# Patient Record
Sex: Male | Born: 1935 | Race: White | Hispanic: No | Marital: Married | Smoking: Former smoker
Health system: Southern US, Community
[De-identification: ages and names within clinical notes are randomized; demographics above are authoritative.]

## PROBLEM LIST (undated history)

## (undated) DIAGNOSIS — Z9861 Coronary angioplasty status: Secondary | ICD-10-CM

## (undated) DIAGNOSIS — J449 Chronic obstructive pulmonary disease, unspecified: Secondary | ICD-10-CM

## (undated) DIAGNOSIS — I739 Peripheral vascular disease, unspecified: Secondary | ICD-10-CM

## (undated) DIAGNOSIS — F329 Major depressive disorder, single episode, unspecified: Secondary | ICD-10-CM

## (undated) DIAGNOSIS — I2510 Atherosclerotic heart disease of native coronary artery without angina pectoris: Secondary | ICD-10-CM

## (undated) DIAGNOSIS — F32A Depression, unspecified: Secondary | ICD-10-CM

## (undated) DIAGNOSIS — R0600 Dyspnea, unspecified: Secondary | ICD-10-CM

## (undated) DIAGNOSIS — F0390 Unspecified dementia without behavioral disturbance: Secondary | ICD-10-CM

## (undated) DIAGNOSIS — I2119 ST elevation (STEMI) myocardial infarction involving other coronary artery of inferior wall: Secondary | ICD-10-CM

## (undated) DIAGNOSIS — Q159 Congenital malformation of eye, unspecified: Secondary | ICD-10-CM

## (undated) DIAGNOSIS — G9341 Metabolic encephalopathy: Secondary | ICD-10-CM

## (undated) DIAGNOSIS — I10 Essential (primary) hypertension: Secondary | ICD-10-CM

## (undated) DIAGNOSIS — N183 Chronic kidney disease, stage 3 unspecified: Secondary | ICD-10-CM

## (undated) DIAGNOSIS — M1990 Unspecified osteoarthritis, unspecified site: Secondary | ICD-10-CM

## (undated) DIAGNOSIS — D631 Anemia in chronic kidney disease: Secondary | ICD-10-CM

## (undated) DIAGNOSIS — N184 Chronic kidney disease, stage 4 (severe): Secondary | ICD-10-CM

## (undated) DIAGNOSIS — A414 Sepsis due to anaerobes: Secondary | ICD-10-CM

## (undated) DIAGNOSIS — E039 Hypothyroidism, unspecified: Secondary | ICD-10-CM

## (undated) DIAGNOSIS — N179 Acute kidney failure, unspecified: Secondary | ICD-10-CM

## (undated) DIAGNOSIS — I340 Nonrheumatic mitral (valve) insufficiency: Secondary | ICD-10-CM

## (undated) DIAGNOSIS — F419 Anxiety disorder, unspecified: Secondary | ICD-10-CM

## (undated) DIAGNOSIS — K902 Blind loop syndrome, not elsewhere classified: Secondary | ICD-10-CM

## (undated) HISTORY — PX: AMPUTATION: SHX166

## (undated) HISTORY — DX: Chronic kidney disease, stage 3 (moderate): N18.3

## (undated) HISTORY — PX: CORNEAL TRANSPLANT: SHX108

## (undated) HISTORY — DX: Coronary angioplasty status: Z98.61

## (undated) HISTORY — DX: Chronic kidney disease, stage 4 (severe): N18.4

## (undated) HISTORY — PX: CARPAL TUNNEL RELEASE: SHX101

## (undated) HISTORY — PX: ROTATOR CUFF REPAIR: SHX139

## (undated) HISTORY — DX: Chronic kidney disease, stage 3 unspecified: N18.30

## (undated) HISTORY — DX: Chronic obstructive pulmonary disease, unspecified: J44.9

## (undated) HISTORY — PX: TOTAL KNEE ARTHROPLASTY: SHX125

## (undated) HISTORY — DX: Atherosclerotic heart disease of native coronary artery without angina pectoris: I25.10

## (undated) HISTORY — DX: ST elevation (STEMI) myocardial infarction involving other coronary artery of inferior wall: I21.19

## (undated) HISTORY — PX: HERNIA REPAIR: SHX51

## (undated) HISTORY — PX: TRANSTHORACIC ECHOCARDIOGRAM: SHX275

## (undated) HISTORY — DX: Peripheral vascular disease, unspecified: I73.9

## (undated) HISTORY — DX: Blind loop syndrome, not elsewhere classified: K90.2

## (undated) HISTORY — PX: CORONARY ANGIOPLASTY WITH STENT PLACEMENT: SHX49

## (undated) HISTORY — DX: Anemia in chronic kidney disease: D63.1

## (undated) HISTORY — DX: Nonrheumatic mitral (valve) insufficiency: I34.0

## (undated) HISTORY — DX: Essential (primary) hypertension: I10

## (undated) HISTORY — PX: VAGOTOMY: SUR1431

## (undated) HISTORY — PX: PARTIAL GASTRECTOMY: SHX2172

## (undated) HISTORY — PX: FOOT SURGERY: SHX648

## (undated) HISTORY — DX: Hypothyroidism, unspecified: E03.9

---
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
## 1983-12-24 HISTORY — PX: SPINE SURGERY: SHX786

## 1998-07-17 ENCOUNTER — Inpatient Hospital Stay (HOSPITAL_COMMUNITY): Admission: RE | Admit: 1998-07-17 | Discharge: 1998-07-21 | Payer: Self-pay | Admitting: Specialist

## 1998-07-24 ENCOUNTER — Encounter: Admission: RE | Admit: 1998-07-24 | Discharge: 1998-10-22 | Payer: Self-pay | Admitting: Specialist

## 1998-07-24 ENCOUNTER — Encounter (HOSPITAL_COMMUNITY): Admission: RE | Admit: 1998-07-24 | Discharge: 1998-10-22 | Payer: Self-pay | Admitting: Specialist

## 1998-12-25 ENCOUNTER — Encounter: Payer: Self-pay | Admitting: Internal Medicine

## 1998-12-25 ENCOUNTER — Inpatient Hospital Stay (HOSPITAL_COMMUNITY): Admission: EM | Admit: 1998-12-25 | Discharge: 1998-12-26 | Payer: Self-pay | Admitting: Emergency Medicine

## 1998-12-25 ENCOUNTER — Encounter: Payer: Self-pay | Admitting: Emergency Medicine

## 1999-01-10 ENCOUNTER — Encounter: Payer: Self-pay | Admitting: *Deleted

## 1999-01-10 ENCOUNTER — Ambulatory Visit (HOSPITAL_COMMUNITY): Admission: RE | Admit: 1999-01-10 | Discharge: 1999-01-10 | Payer: Self-pay | Admitting: *Deleted

## 1999-01-30 ENCOUNTER — Ambulatory Visit (HOSPITAL_COMMUNITY): Admission: RE | Admit: 1999-01-30 | Discharge: 1999-01-30 | Payer: Self-pay | Admitting: *Deleted

## 1999-02-21 ENCOUNTER — Other Ambulatory Visit: Admission: RE | Admit: 1999-02-21 | Discharge: 1999-02-21 | Payer: Self-pay | Admitting: Gastroenterology

## 1999-03-02 ENCOUNTER — Ambulatory Visit (HOSPITAL_COMMUNITY): Admission: RE | Admit: 1999-03-02 | Discharge: 1999-03-02 | Payer: Self-pay | Admitting: Pulmonary Disease

## 1999-03-02 ENCOUNTER — Encounter: Payer: Self-pay | Admitting: Pulmonary Disease

## 1999-03-20 ENCOUNTER — Ambulatory Visit: Admission: RE | Admit: 1999-03-20 | Discharge: 1999-03-20 | Payer: Self-pay | Admitting: Pulmonary Disease

## 2000-02-05 ENCOUNTER — Other Ambulatory Visit: Admission: RE | Admit: 2000-02-05 | Discharge: 2000-02-05 | Payer: Self-pay | Admitting: Internal Medicine

## 2000-02-05 ENCOUNTER — Encounter (INDEPENDENT_AMBULATORY_CARE_PROVIDER_SITE_OTHER): Payer: Self-pay | Admitting: Specialist

## 2000-04-27 ENCOUNTER — Emergency Department (HOSPITAL_COMMUNITY): Admission: EM | Admit: 2000-04-27 | Discharge: 2000-04-27 | Payer: Self-pay

## 2000-05-23 ENCOUNTER — Encounter: Payer: Self-pay | Admitting: Specialist

## 2000-05-28 ENCOUNTER — Observation Stay (HOSPITAL_COMMUNITY): Admission: RE | Admit: 2000-05-28 | Discharge: 2000-05-29 | Payer: Self-pay | Admitting: Specialist

## 2001-06-07 ENCOUNTER — Emergency Department (HOSPITAL_COMMUNITY): Admission: EM | Admit: 2001-06-07 | Discharge: 2001-06-07 | Payer: Self-pay | Admitting: Emergency Medicine

## 2001-06-23 ENCOUNTER — Encounter: Payer: Self-pay | Admitting: Cardiology

## 2001-06-23 ENCOUNTER — Encounter: Admission: RE | Admit: 2001-06-23 | Discharge: 2001-06-23 | Payer: Self-pay | Admitting: Cardiology

## 2001-06-30 ENCOUNTER — Ambulatory Visit (HOSPITAL_COMMUNITY): Admission: AD | Admit: 2001-06-30 | Discharge: 2001-06-30 | Payer: Self-pay | Admitting: Cardiology

## 2001-12-27 ENCOUNTER — Inpatient Hospital Stay (HOSPITAL_COMMUNITY): Admission: EM | Admit: 2001-12-27 | Discharge: 2001-12-31 | Payer: Self-pay | Admitting: Emergency Medicine

## 2001-12-27 ENCOUNTER — Encounter: Payer: Self-pay | Admitting: Emergency Medicine

## 2002-01-22 ENCOUNTER — Encounter (INDEPENDENT_AMBULATORY_CARE_PROVIDER_SITE_OTHER): Payer: Self-pay | Admitting: Specialist

## 2002-01-22 ENCOUNTER — Encounter: Payer: Self-pay | Admitting: Internal Medicine

## 2002-01-22 ENCOUNTER — Inpatient Hospital Stay (HOSPITAL_COMMUNITY): Admission: EM | Admit: 2002-01-22 | Discharge: 2002-01-28 | Payer: Self-pay | Admitting: Gastroenterology

## 2002-01-25 ENCOUNTER — Encounter: Payer: Self-pay | Admitting: Gastroenterology

## 2002-05-23 ENCOUNTER — Emergency Department (HOSPITAL_COMMUNITY): Admission: EM | Admit: 2002-05-23 | Discharge: 2002-05-23 | Payer: Self-pay | Admitting: Emergency Medicine

## 2002-05-23 ENCOUNTER — Encounter: Payer: Self-pay | Admitting: Emergency Medicine

## 2002-07-06 ENCOUNTER — Encounter: Payer: Self-pay | Admitting: Specialist

## 2002-07-06 ENCOUNTER — Ambulatory Visit (HOSPITAL_COMMUNITY): Admission: RE | Admit: 2002-07-06 | Discharge: 2002-07-06 | Payer: Self-pay | Admitting: Specialist

## 2002-11-23 ENCOUNTER — Inpatient Hospital Stay (HOSPITAL_COMMUNITY): Admission: RE | Admit: 2002-11-23 | Discharge: 2002-11-24 | Payer: Self-pay | Admitting: Orthopedic Surgery

## 2002-11-23 ENCOUNTER — Encounter: Payer: Self-pay | Admitting: Orthopedic Surgery

## 2005-01-17 ENCOUNTER — Ambulatory Visit: Payer: Self-pay | Admitting: Family Medicine

## 2005-04-24 ENCOUNTER — Ambulatory Visit: Payer: Self-pay | Admitting: Family Medicine

## 2005-05-09 ENCOUNTER — Ambulatory Visit: Payer: Self-pay | Admitting: Family Medicine

## 2005-05-14 ENCOUNTER — Emergency Department (HOSPITAL_COMMUNITY): Admission: EM | Admit: 2005-05-14 | Discharge: 2005-05-14 | Payer: Self-pay | Admitting: Emergency Medicine

## 2005-05-22 ENCOUNTER — Ambulatory Visit: Payer: Self-pay | Admitting: Family Medicine

## 2005-10-03 ENCOUNTER — Ambulatory Visit: Payer: Self-pay | Admitting: Family Medicine

## 2005-12-23 HISTORY — PX: CAROTID ENDARTERECTOMY: SUR193

## 2006-01-28 ENCOUNTER — Ambulatory Visit: Payer: Self-pay | Admitting: Family Medicine

## 2006-02-13 ENCOUNTER — Ambulatory Visit: Payer: Self-pay

## 2006-02-28 ENCOUNTER — Encounter: Admission: RE | Admit: 2006-02-28 | Discharge: 2006-02-28 | Payer: Self-pay | Admitting: *Deleted

## 2006-03-05 ENCOUNTER — Inpatient Hospital Stay (HOSPITAL_COMMUNITY): Admission: RE | Admit: 2006-03-05 | Discharge: 2006-03-06 | Payer: Self-pay | Admitting: *Deleted

## 2006-03-05 ENCOUNTER — Encounter (INDEPENDENT_AMBULATORY_CARE_PROVIDER_SITE_OTHER): Payer: Self-pay | Admitting: Specialist

## 2006-04-06 DIAGNOSIS — I779 Disorder of arteries and arterioles, unspecified: Secondary | ICD-10-CM

## 2006-04-06 HISTORY — DX: Disorder of arteries and arterioles, unspecified: I77.9

## 2006-04-17 ENCOUNTER — Ambulatory Visit: Payer: Self-pay | Admitting: Family Medicine

## 2006-09-04 ENCOUNTER — Ambulatory Visit: Payer: Self-pay | Admitting: Family Medicine

## 2006-09-17 ENCOUNTER — Ambulatory Visit: Payer: Self-pay

## 2006-10-07 ENCOUNTER — Ambulatory Visit: Payer: Self-pay | Admitting: Family Medicine

## 2006-10-07 LAB — CONVERTED CEMR LAB
AST: 23 units/L (ref 0–37)
BUN: 32 mg/dL — ABNORMAL HIGH (ref 6–23)
CO2: 27 meq/L (ref 19–32)
Calcium: 9.8 mg/dL (ref 8.4–10.5)
Chloride: 106 meq/L (ref 96–112)
Chol/HDL Ratio, serum: 4
Cholesterol: 129 mg/dL (ref 0–200)
Creatinine, Ser: 1.5 mg/dL (ref 0.4–1.5)
Glomerular Filtration Rate, Af Am: 59 mL/min/{1.73_m2}
HDL: 32.5 mg/dL — ABNORMAL LOW (ref >39.0)
LDL Cholesterol: 78 mg/dL (ref 0–99)
MCHC: 34.5 g/dL (ref 30.0–36.0)
Monocytes Relative: 11.7 % — ABNORMAL HIGH (ref 3.0–11.0)
PSA: 2.97 ng/mL (ref 0.10–4.00)
Platelets: 146 10*3/uL — ABNORMAL LOW (ref 150–400)
RBC: 4.35 M/uL (ref 4.22–5.81)
RDW: 14.1 % (ref 11.5–14.6)
TSH: 2.19 microintl units/mL (ref 0.35–5.50)
VLDL: 18 mg/dL (ref 0–40)

## 2007-03-03 ENCOUNTER — Ambulatory Visit: Payer: Self-pay

## 2007-03-11 ENCOUNTER — Emergency Department (HOSPITAL_COMMUNITY): Admission: EM | Admit: 2007-03-11 | Discharge: 2007-03-11 | Payer: Self-pay | Admitting: *Deleted

## 2007-06-14 ENCOUNTER — Emergency Department (HOSPITAL_COMMUNITY): Admission: EM | Admit: 2007-06-14 | Discharge: 2007-06-14 | Payer: Self-pay | Admitting: Emergency Medicine

## 2007-06-15 ENCOUNTER — Emergency Department (HOSPITAL_COMMUNITY): Admission: EM | Admit: 2007-06-15 | Discharge: 2007-06-15 | Payer: Self-pay | Admitting: Emergency Medicine

## 2007-06-16 ENCOUNTER — Ambulatory Visit: Payer: Self-pay | Admitting: Family Medicine

## 2007-06-30 ENCOUNTER — Encounter: Payer: Self-pay | Admitting: Family Medicine

## 2007-06-30 DIAGNOSIS — I10 Essential (primary) hypertension: Secondary | ICD-10-CM

## 2007-06-30 HISTORY — DX: Essential (primary) hypertension: I10

## 2007-07-01 ENCOUNTER — Ambulatory Visit: Payer: Self-pay | Admitting: Family Medicine

## 2007-07-01 LAB — CONVERTED CEMR LAB
Eosinophils Absolute: 0.1 10*3/uL (ref 0.0–0.6)
HCT: 40 % (ref 39.0–52.0)
MCHC: 34.8 g/dL (ref 30.0–36.0)
Neutro Abs: 4.9 10*3/uL (ref 1.4–7.7)
Neutrophils Relative %: 58.6 % (ref 43.0–77.0)
RBC: 3.93 M/uL — ABNORMAL LOW (ref 4.22–5.81)
RDW: 14 % (ref 11.5–14.6)
WBC: 8.1 10*3/uL (ref 4.5–10.5)

## 2007-07-29 ENCOUNTER — Ambulatory Visit: Payer: Self-pay | Admitting: Family Medicine

## 2007-07-30 LAB — CONVERTED CEMR LAB
Basophils Absolute: 0 10*3/uL (ref 0.0–0.1)
Eosinophils Absolute: 0.2 10*3/uL (ref 0.0–0.6)
HCT: 40.8 % (ref 39.0–52.0)
MCHC: 35.2 g/dL (ref 30.0–36.0)
MCV: 98.7 fL (ref 78.0–100.0)
Neutrophils Relative %: 61.5 % (ref 43.0–77.0)
Platelets: 115 10*3/uL — ABNORMAL LOW (ref 150–400)
RBC: 4.14 M/uL — ABNORMAL LOW (ref 4.22–5.81)
RDW: 12.6 % (ref 11.5–14.6)
Vitamin B-12: 247 pg/mL (ref 211–911)
WBC: 8.1 10*3/uL (ref 4.5–10.5)

## 2007-07-31 ENCOUNTER — Ambulatory Visit: Payer: Self-pay | Admitting: Internal Medicine

## 2007-08-06 ENCOUNTER — Ambulatory Visit: Payer: Self-pay | Admitting: Family Medicine

## 2007-08-14 LAB — CONVERTED CEMR LAB
Basophils Absolute: 0 10*3/uL (ref 0.0–0.1)
Basophils Relative: 0.4 % (ref 0.0–1.0)
Eosinophils Relative: 3.3 % (ref 0.0–5.0)
HCT: 40.3 % (ref 39.0–52.0)
Hemoglobin: 14.4 g/dL (ref 13.0–17.0)
Monocytes Absolute: 1 10*3/uL — ABNORMAL HIGH (ref 0.2–0.7)
Neutrophils Relative %: 56.4 % (ref 43.0–77.0)
RBC: 4.08 M/uL — ABNORMAL LOW (ref 4.22–5.81)
RDW: 13 % (ref 11.5–14.6)
WBC: 9.5 10*3/uL (ref 4.5–10.5)

## 2007-08-20 ENCOUNTER — Encounter: Payer: Self-pay | Admitting: Family Medicine

## 2007-08-20 LAB — COMPREHENSIVE METABOLIC PANEL
Albumin: 4.2 g/dL (ref 3.5–5.2)
CO2: 21 mEq/L (ref 19–32)
Calcium: 9.5 mg/dL (ref 8.4–10.5)
Glucose, Bld: 92 mg/dL (ref 70–99)
Potassium: 4.9 mEq/L (ref 3.5–5.3)
Sodium: 142 mEq/L (ref 135–145)
Total Bilirubin: 0.6 mg/dL (ref 0.3–1.2)
Total Protein: 6.8 g/dL (ref 6.0–8.3)

## 2007-08-20 LAB — CBC WITH DIFFERENTIAL/PLATELET
BASO%: 0.4 % (ref 0.0–2.0)
HCT: 39.9 % (ref 38.7–49.9)
LYMPH%: 30.9 % (ref 14.0–48.0)
MCHC: 36.4 g/dL — ABNORMAL HIGH (ref 32.0–35.9)
MONO#: 0.6 10*3/uL (ref 0.1–0.9)
NEUT%: 56.6 % (ref 40.0–75.0)
Platelets: 123 10*3/uL — ABNORMAL LOW (ref 145–400)
WBC: 6.8 10*3/uL (ref 4.0–10.0)

## 2007-08-20 LAB — LACTATE DEHYDROGENASE: LDH: 225 U/L (ref 94–250)

## 2007-09-02 LAB — CBC WITH DIFFERENTIAL/PLATELET
BASO%: 0.5 % (ref 0.0–2.0)
MCHC: 35.9 g/dL (ref 32.0–35.9)
MONO#: 0.6 10*3/uL (ref 0.1–0.9)
RBC: 4.03 10*6/uL — ABNORMAL LOW (ref 4.20–5.71)
RDW: 13.1 % (ref 11.2–14.6)
WBC: 7.2 10*3/uL (ref 4.0–10.0)
lymph#: 2.4 10*3/uL (ref 0.9–3.3)

## 2007-09-18 ENCOUNTER — Encounter: Payer: Self-pay | Admitting: Family Medicine

## 2007-09-28 ENCOUNTER — Ambulatory Visit: Payer: Self-pay | Admitting: Internal Medicine

## 2007-09-30 ENCOUNTER — Encounter: Payer: Self-pay | Admitting: Family Medicine

## 2007-09-30 LAB — CBC WITH DIFFERENTIAL/PLATELET
BASO%: 0.5 % (ref 0.0–2.0)
Basophils Absolute: 0 10*3/uL (ref 0.0–0.1)
HCT: 39.8 % (ref 38.7–49.9)
HGB: 14.3 g/dL (ref 13.0–17.1)
MCHC: 36 g/dL — ABNORMAL HIGH (ref 32.0–35.9)
MONO#: 0.7 10*3/uL (ref 0.1–0.9)
NEUT%: 64.5 % (ref 40.0–75.0)
RDW: 13.1 % (ref 11.2–14.6)
WBC: 7.2 10*3/uL (ref 4.0–10.0)
lymph#: 1.7 10*3/uL (ref 0.9–3.3)

## 2007-10-14 ENCOUNTER — Encounter: Payer: Self-pay | Admitting: Family Medicine

## 2007-10-20 ENCOUNTER — Ambulatory Visit: Payer: Self-pay | Admitting: Family Medicine

## 2007-10-21 ENCOUNTER — Telehealth: Payer: Self-pay | Admitting: Family Medicine

## 2007-11-05 ENCOUNTER — Encounter: Payer: Self-pay | Admitting: Family Medicine

## 2007-11-10 ENCOUNTER — Encounter: Payer: Self-pay | Admitting: Family Medicine

## 2007-11-25 ENCOUNTER — Encounter: Payer: Self-pay | Admitting: Family Medicine

## 2008-01-11 ENCOUNTER — Telehealth: Payer: Self-pay | Admitting: Family Medicine

## 2008-05-22 ENCOUNTER — Observation Stay (HOSPITAL_COMMUNITY): Admission: EM | Admit: 2008-05-22 | Discharge: 2008-05-23 | Payer: Self-pay | Admitting: Emergency Medicine

## 2008-05-22 ENCOUNTER — Encounter: Payer: Self-pay | Admitting: Internal Medicine

## 2008-05-22 ENCOUNTER — Ambulatory Visit: Payer: Self-pay | Admitting: Internal Medicine

## 2008-05-22 DIAGNOSIS — I2510 Atherosclerotic heart disease of native coronary artery without angina pectoris: Secondary | ICD-10-CM

## 2008-05-22 DIAGNOSIS — Z9861 Coronary angioplasty status: Secondary | ICD-10-CM

## 2008-05-22 HISTORY — DX: Atherosclerotic heart disease of native coronary artery without angina pectoris: I25.10

## 2008-05-22 HISTORY — DX: Coronary angioplasty status: Z98.61

## 2008-05-26 ENCOUNTER — Encounter: Admission: RE | Admit: 2008-05-26 | Discharge: 2008-05-26 | Payer: Self-pay | Admitting: Cardiology

## 2008-06-05 ENCOUNTER — Emergency Department (HOSPITAL_COMMUNITY): Admission: EM | Admit: 2008-06-05 | Discharge: 2008-06-05 | Payer: Self-pay | Admitting: Emergency Medicine

## 2008-06-05 ENCOUNTER — Telehealth: Payer: Self-pay | Admitting: Family Medicine

## 2008-08-09 ENCOUNTER — Ambulatory Visit: Payer: Self-pay | Admitting: Family Medicine

## 2008-08-10 ENCOUNTER — Telehealth: Payer: Self-pay | Admitting: Family Medicine

## 2008-08-11 LAB — CONVERTED CEMR LAB
Eosinophils Absolute: 0.2 10*3/uL (ref 0.0–0.7)
HCT: 37.7 % — ABNORMAL LOW (ref 39.0–52.0)
Hemoglobin: 13.3 g/dL (ref 13.0–17.0)
MCHC: 35.4 g/dL (ref 30.0–36.0)
MCV: 99.5 fL (ref 78.0–100.0)
Monocytes Absolute: 0.7 10*3/uL (ref 0.1–1.0)
Monocytes Relative: 9.2 % (ref 3.0–12.0)
Neutro Abs: 4.4 10*3/uL (ref 1.4–7.7)
Platelets: 113 10*3/uL — ABNORMAL LOW (ref 150–400)
RDW: 12.5 % (ref 11.5–14.6)
T4, Total: 6.5 ug/dL (ref 5.0–12.5)
TSH: 2.12 microintl units/mL (ref 0.35–5.50)

## 2008-08-30 ENCOUNTER — Ambulatory Visit: Payer: Self-pay | Admitting: Gastroenterology

## 2008-09-02 ENCOUNTER — Ambulatory Visit: Payer: Self-pay | Admitting: Gastroenterology

## 2008-09-02 LAB — CONVERTED CEMR LAB
Amylase: 117 units/L (ref 27–131)
Ferritin: 135.5 ng/mL (ref 22.0–322.0)
Folate: 6.2 ng/mL
Iron: 88 ug/dL (ref 42–165)
Lipase: 30 units/L (ref 11.0–59.0)
Magnesium: 1.6 mg/dL (ref 1.5–2.5)
Saturation Ratios: 33.3 % (ref 20.0–50.0)
Transferrin: 188.5 mg/dL — ABNORMAL LOW (ref >=212.0)
Vitamin B-12: 139 pg/mL — ABNORMAL LOW (ref 211–911)

## 2008-09-09 ENCOUNTER — Ambulatory Visit: Payer: Self-pay | Admitting: Gastroenterology

## 2008-09-14 ENCOUNTER — Telehealth: Payer: Self-pay | Admitting: Gastroenterology

## 2008-09-16 ENCOUNTER — Ambulatory Visit: Payer: Self-pay | Admitting: Gastroenterology

## 2008-09-16 ENCOUNTER — Encounter: Payer: Self-pay | Admitting: Family Medicine

## 2008-09-19 ENCOUNTER — Ambulatory Visit: Payer: Self-pay | Admitting: Gastroenterology

## 2008-09-19 ENCOUNTER — Encounter: Payer: Self-pay | Admitting: Gastroenterology

## 2008-09-20 ENCOUNTER — Telehealth: Payer: Self-pay | Admitting: Family Medicine

## 2008-09-20 ENCOUNTER — Telehealth: Payer: Self-pay

## 2008-09-21 ENCOUNTER — Encounter: Payer: Self-pay | Admitting: Gastroenterology

## 2008-09-21 ENCOUNTER — Telehealth: Payer: Self-pay | Admitting: Gastroenterology

## 2008-09-22 ENCOUNTER — Telehealth: Payer: Self-pay | Admitting: Gastroenterology

## 2008-10-05 ENCOUNTER — Encounter: Payer: Self-pay | Admitting: Family Medicine

## 2008-10-18 ENCOUNTER — Ambulatory Visit: Payer: Self-pay | Admitting: Family Medicine

## 2008-10-18 LAB — CONVERTED CEMR LAB
Bilirubin Urine: NEGATIVE
Blood in Urine, dipstick: NEGATIVE
Glucose, Urine, Semiquant: NEGATIVE
Protein, U semiquant: NEGATIVE
Urobilinogen, UA: 0.2
WBC Urine, dipstick: NEGATIVE
pH: 5

## 2008-11-04 ENCOUNTER — Ambulatory Visit: Payer: Self-pay | Admitting: Gastroenterology

## 2008-11-04 DIAGNOSIS — K902 Blind loop syndrome, not elsewhere classified: Secondary | ICD-10-CM

## 2008-11-04 HISTORY — DX: Blind loop syndrome, not elsewhere classified: K90.2

## 2008-11-21 ENCOUNTER — Ambulatory Visit: Payer: Self-pay | Admitting: Family Medicine

## 2008-11-21 LAB — CONVERTED CEMR LAB
AST: 22 units/L (ref 0–37)
Alkaline Phosphatase: 77 units/L (ref 39–117)
Basophils Absolute: 0 10*3/uL (ref 0.0–0.1)
Chloride: 114 meq/L — ABNORMAL HIGH (ref 96–112)
Cholesterol: 131 mg/dL (ref 0–200)
Creatinine, Ser: 1.4 mg/dL (ref 0.4–1.5)
Eosinophils Absolute: 0.1 10*3/uL (ref 0.0–0.7)
GFR calc non Af Amer: 52 mL/min
HDL: 37.6 mg/dL — ABNORMAL LOW (ref >39.0)
MCHC: 34.9 g/dL (ref 30.0–36.0)
MCV: 100.3 fL — ABNORMAL HIGH (ref 78.0–100.0)
Neutrophils Relative %: 58.9 % (ref 43.0–77.0)
Platelets: 97 10*3/uL — ABNORMAL LOW (ref 150–400)
Potassium: 4.5 meq/L (ref 3.5–5.1)
Sodium: 145 meq/L (ref 135–145)
Total Bilirubin: 1 mg/dL (ref 0.3–1.2)
VLDL: 17 mg/dL (ref 0–40)
Vitamin B-12: 406 pg/mL (ref 211–911)

## 2008-11-27 ENCOUNTER — Ambulatory Visit: Payer: Self-pay | Admitting: Internal Medicine

## 2008-11-27 ENCOUNTER — Inpatient Hospital Stay (HOSPITAL_COMMUNITY): Admission: EM | Admit: 2008-11-27 | Discharge: 2008-11-30 | Payer: Self-pay | Admitting: Emergency Medicine

## 2008-12-08 ENCOUNTER — Ambulatory Visit: Payer: Self-pay | Admitting: Family Medicine

## 2008-12-19 ENCOUNTER — Telehealth: Payer: Self-pay | Admitting: Gastroenterology

## 2008-12-29 ENCOUNTER — Telehealth: Payer: Self-pay | Admitting: Gastroenterology

## 2009-01-03 ENCOUNTER — Ambulatory Visit: Payer: Self-pay | Admitting: Family Medicine

## 2009-02-07 ENCOUNTER — Ambulatory Visit: Payer: Self-pay | Admitting: Family Medicine

## 2009-02-28 ENCOUNTER — Ambulatory Visit: Payer: Self-pay | Admitting: Family Medicine

## 2009-03-07 ENCOUNTER — Ambulatory Visit: Payer: Self-pay | Admitting: Family Medicine

## 2009-03-07 LAB — CONVERTED CEMR LAB
Cholesterol, target level: 200 mg/dL
HDL goal, serum: 40 mg/dL
LDL Goal: 100 mg/dL

## 2009-03-28 ENCOUNTER — Ambulatory Visit: Payer: Self-pay | Admitting: Family Medicine

## 2009-04-25 ENCOUNTER — Ambulatory Visit: Payer: Self-pay | Admitting: Family Medicine

## 2009-05-23 ENCOUNTER — Ambulatory Visit: Payer: Self-pay | Admitting: Family Medicine

## 2009-05-25 ENCOUNTER — Ambulatory Visit: Payer: Self-pay | Admitting: Family Medicine

## 2009-06-22 ENCOUNTER — Ambulatory Visit: Payer: Self-pay | Admitting: Family Medicine

## 2009-07-12 ENCOUNTER — Telehealth: Payer: Self-pay | Admitting: Family Medicine

## 2009-07-25 ENCOUNTER — Ambulatory Visit: Payer: Self-pay | Admitting: Family Medicine

## 2009-08-24 ENCOUNTER — Ambulatory Visit: Payer: Self-pay | Admitting: Family Medicine

## 2009-10-19 ENCOUNTER — Ambulatory Visit: Payer: Self-pay | Admitting: Family Medicine

## 2009-10-19 LAB — CONVERTED CEMR LAB
Bilirubin Urine: NEGATIVE
Glucose, Urine, Semiquant: NEGATIVE
Specific Gravity, Urine: 1.015
pH: 5

## 2009-10-31 LAB — CONVERTED CEMR LAB
ALT: 16 units/L (ref 0–53)
Albumin: 4.1 g/dL (ref 3.5–5.2)
BUN: 53 mg/dL — ABNORMAL HIGH (ref 6–23)
Chloride: 110 meq/L (ref 96–112)
Cholesterol: 128 mg/dL (ref 0–200)
Eosinophils Relative: 1.9 % (ref 0.0–5.0)
Glucose, Bld: 101 mg/dL — ABNORMAL HIGH (ref 70–99)
HCT: 39 % (ref 39.0–52.0)
Hemoglobin: 13.7 g/dL (ref 13.0–17.0)
Lymphs Abs: 2 10*3/uL (ref 0.7–4.0)
MCV: 101.8 fL — ABNORMAL HIGH (ref 78.0–100.0)
Monocytes Absolute: 0.7 10*3/uL (ref 0.1–1.0)
Neutro Abs: 4 10*3/uL (ref 1.4–7.7)
PSA: 5.66 ng/mL — ABNORMAL HIGH (ref 0.10–4.00)
Platelets: 93 10*3/uL — ABNORMAL LOW (ref 150.0–400.0)
Potassium: 4.3 meq/L (ref 3.5–5.1)
RDW: 13 % (ref 11.5–14.6)
TSH: 1.86 microintl units/mL (ref 0.35–5.50)
Total Bilirubin: 1.2 mg/dL (ref 0.3–1.2)

## 2009-11-05 LAB — CONVERTED CEMR LAB
BUN: 38 mg/dL — ABNORMAL HIGH (ref 6–23)
Basophils Relative: 0.3 % (ref 0.0–3.0)
CO2: 22 meq/L (ref 19–32)
Chloride: 115 meq/L — ABNORMAL HIGH (ref 96–112)
Creatinine, Ser: 1.5 mg/dL (ref 0.4–1.5)
Eosinophils Absolute: 0.1 10*3/uL (ref 0.0–0.7)
Eosinophils Relative: 1.9 % (ref 0.0–5.0)
MCV: 99.7 fL (ref 78.0–100.0)
Neutrophils Relative %: 62.1 % (ref 43.0–77.0)
Phosphorus: 4 mg/dL (ref 2.3–4.6)
RBC: 4.05 M/uL — ABNORMAL LOW (ref 4.22–5.81)
WBC: 6.6 10*3/uL (ref 4.5–10.5)

## 2009-11-07 ENCOUNTER — Telehealth: Payer: Self-pay | Admitting: Family Medicine

## 2009-11-14 ENCOUNTER — Ambulatory Visit: Payer: Self-pay | Admitting: Family Medicine

## 2009-12-06 ENCOUNTER — Ambulatory Visit: Payer: Self-pay | Admitting: Family Medicine

## 2009-12-06 LAB — CONVERTED CEMR LAB
Blood in Urine, dipstick: NEGATIVE
Glucose, Urine, Semiquant: NEGATIVE
Nitrite: NEGATIVE
Protein, U semiquant: NEGATIVE
Urobilinogen, UA: 0.2
WBC Urine, dipstick: NEGATIVE
pH: 5

## 2009-12-07 IMAGING — CR DG CERVICAL SPINE COMPLETE 4+V
6 series · 6 of 6 positions shown · non-contrast
Comparison: 06/14/2007

CLINICAL DATA: Neck pain

CERVICAL SPINE - COMPLETE 4+ VIEW

[w c-spine lat]
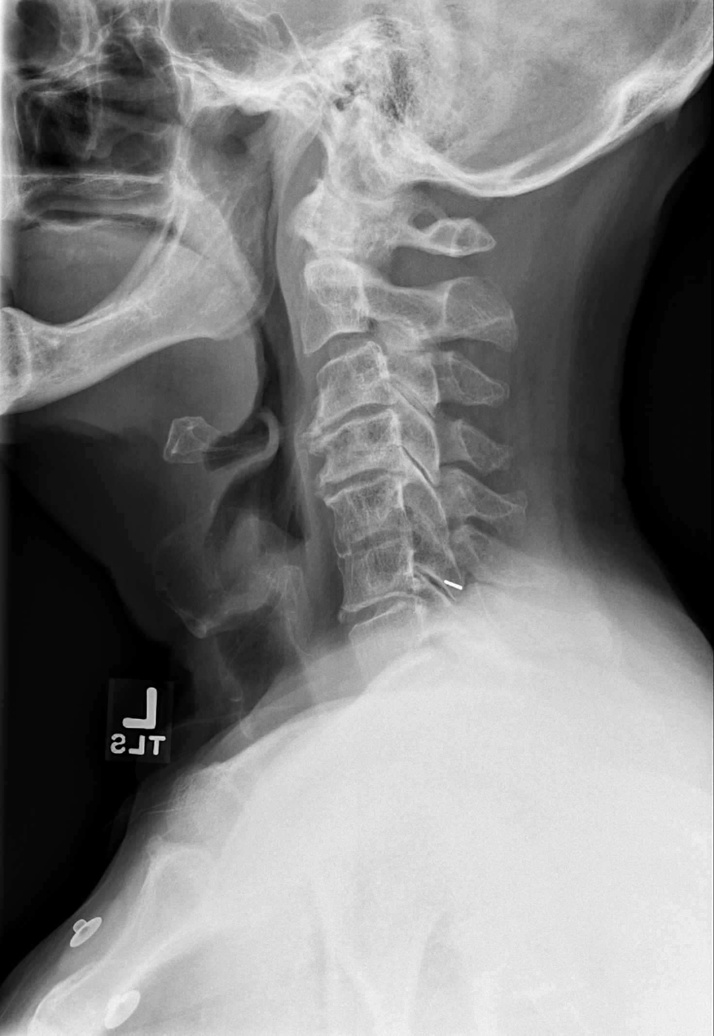

[w c-spine oblique (1 of 2)]
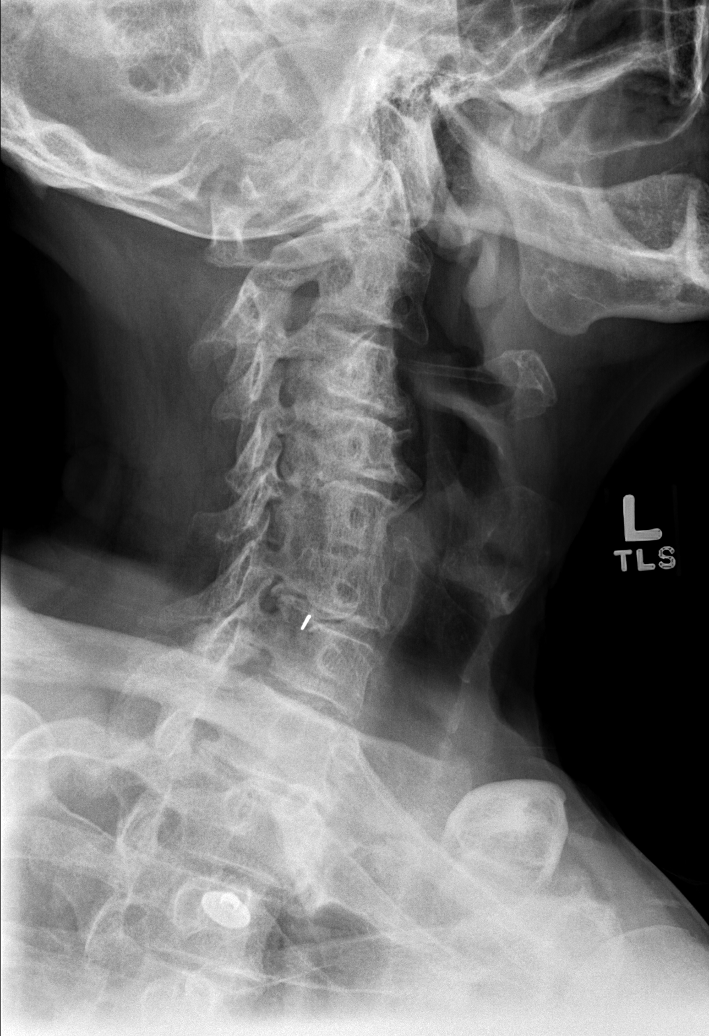

[w c-spine oblique (2 of 2)]
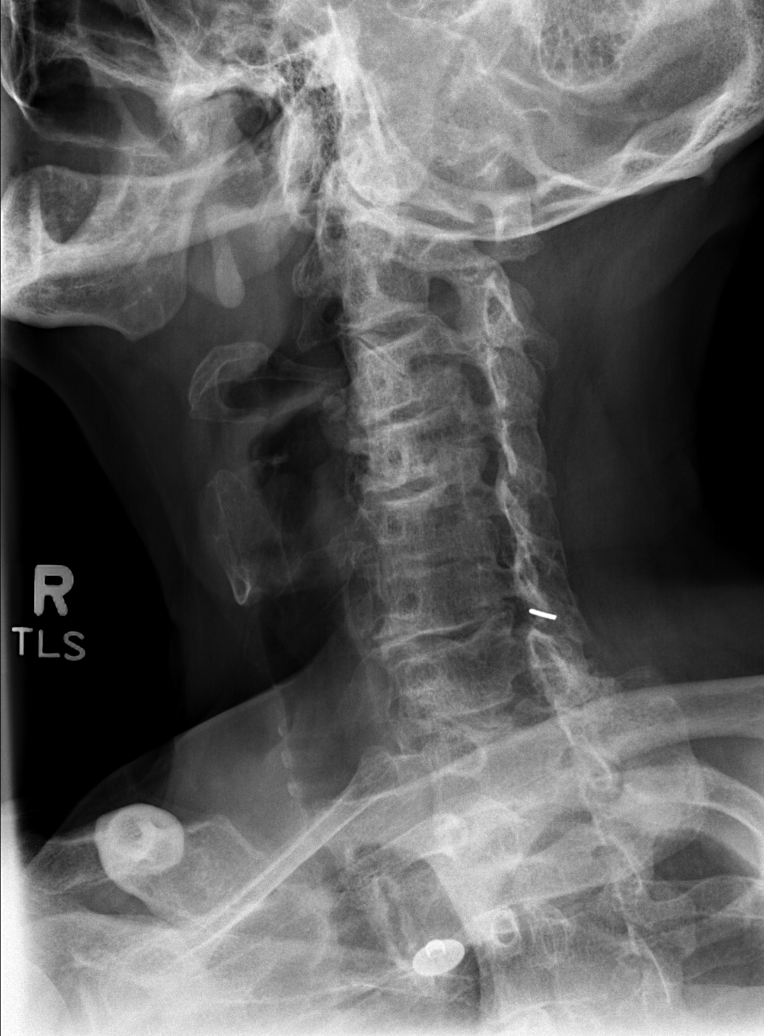

[w c-spine a.p.]
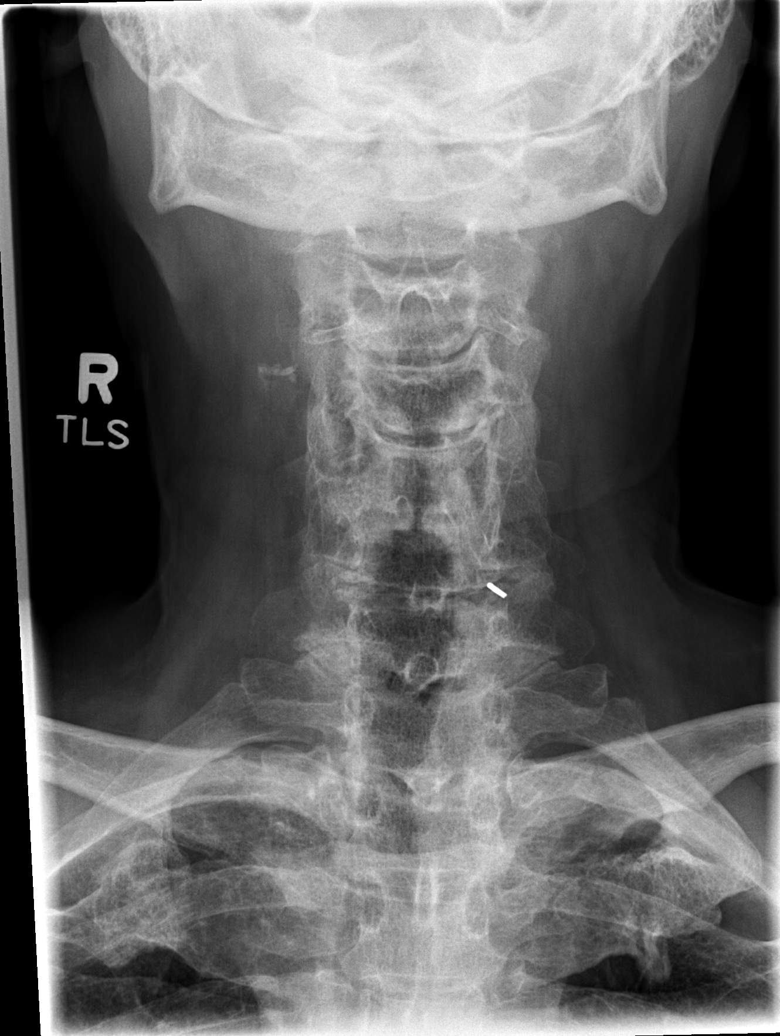

[w c-spine odontoid]
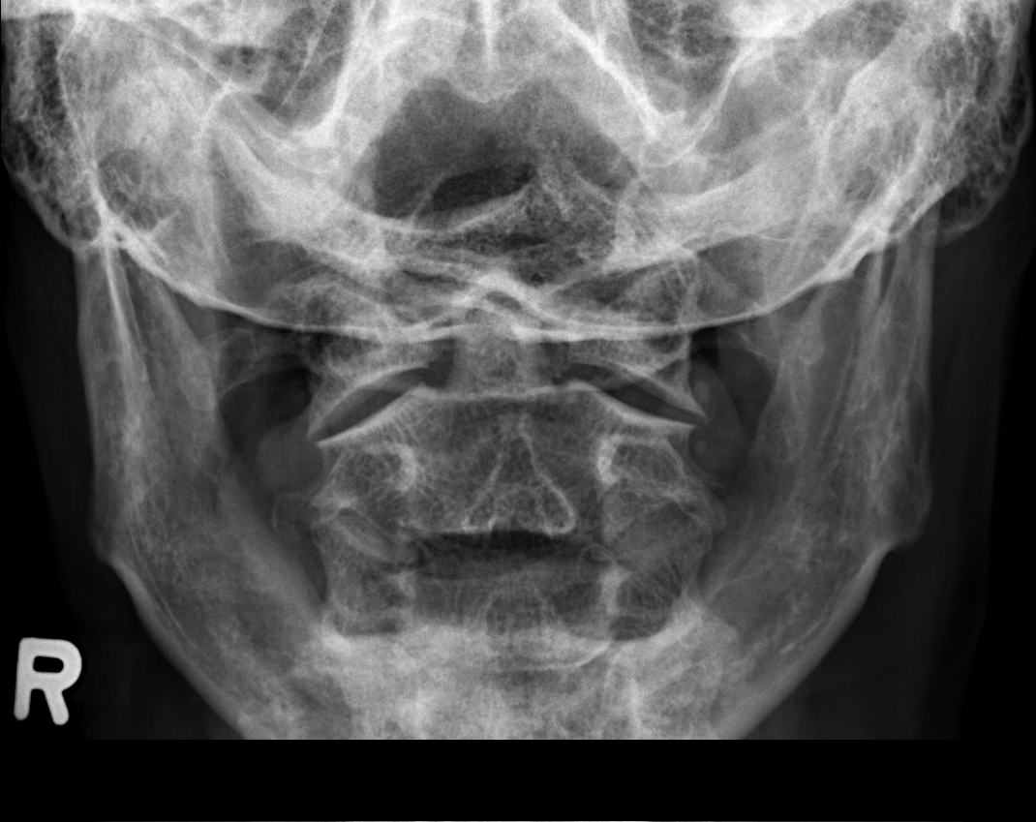

[w swimmers view]
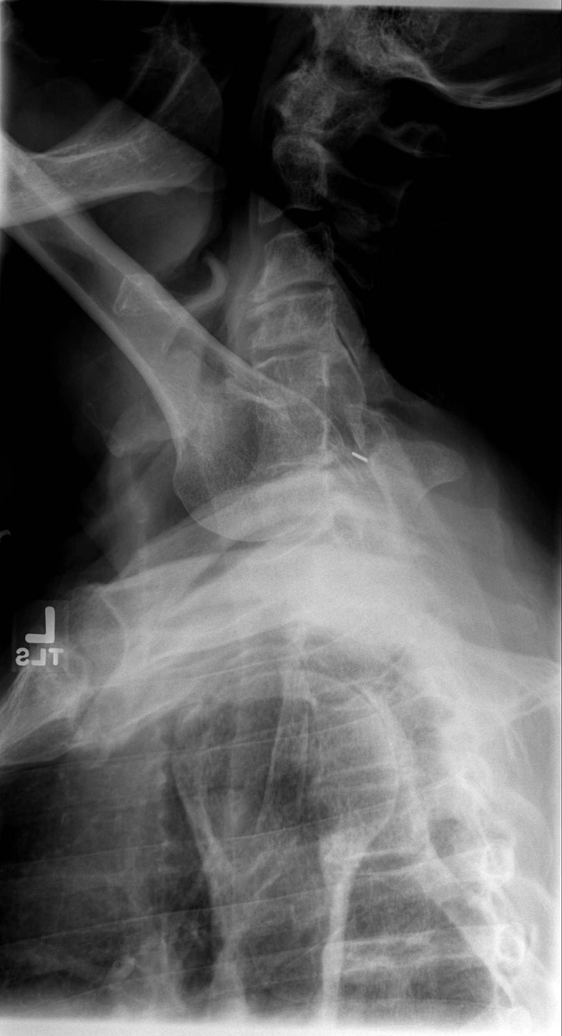

[6 of 6 positions shown; findings below may reference images not displayed]

FINDINGS: There is again noted to be severe narrowing of the C3-4,
C4-5, C5-6, and C6-7 disc spaces.  Straightening of the normally
lordotic cervical spine is stable.  There is no vertebral body
height loss.  Multilevel foraminal narrowing due to uncovertebral
osteophytes is stable.  Postoperative changes are noted.
IMPRESSION: Diffuse degenerative change.  No change.

## 2009-12-13 LAB — CONVERTED CEMR LAB
BUN: 39 mg/dL — ABNORMAL HIGH (ref 6–23)
CO2: 25 meq/L (ref 19–32)
Calcium: 9.6 mg/dL (ref 8.4–10.5)
Chloride: 111 meq/L (ref 96–112)
Creatinine, Ser: 1.4 mg/dL (ref 0.4–1.5)
Glucose, Bld: 94 mg/dL (ref 70–99)

## 2010-01-09 ENCOUNTER — Ambulatory Visit: Payer: Self-pay | Admitting: Family Medicine

## 2010-02-06 ENCOUNTER — Ambulatory Visit: Payer: Self-pay | Admitting: Family Medicine

## 2010-03-06 ENCOUNTER — Ambulatory Visit: Payer: Self-pay | Admitting: Family Medicine

## 2010-03-28 ENCOUNTER — Ambulatory Visit: Payer: Self-pay | Admitting: Family Medicine

## 2010-04-25 ENCOUNTER — Ambulatory Visit: Payer: Self-pay | Admitting: Family Medicine

## 2010-05-10 ENCOUNTER — Ambulatory Visit: Payer: Self-pay | Admitting: Family Medicine

## 2010-05-23 ENCOUNTER — Ambulatory Visit: Payer: Self-pay | Admitting: Family Medicine

## 2010-05-30 ENCOUNTER — Emergency Department (HOSPITAL_COMMUNITY): Admission: EM | Admit: 2010-05-30 | Discharge: 2010-05-30 | Payer: Self-pay | Admitting: Emergency Medicine

## 2010-08-15 ENCOUNTER — Encounter: Payer: Self-pay | Admitting: Family Medicine

## 2010-10-23 HISTORY — PX: CARDIAC CATHETERIZATION: SHX172

## 2010-11-11 ENCOUNTER — Encounter: Payer: Self-pay | Admitting: Emergency Medicine

## 2010-11-11 ENCOUNTER — Inpatient Hospital Stay (HOSPITAL_COMMUNITY): Admission: EM | Admit: 2010-11-11 | Discharge: 2010-11-14 | Payer: Self-pay | Admitting: Cardiovascular Disease

## 2010-11-11 HISTORY — PX: LEFT HEART CATH AND CORONARY ANGIOGRAPHY: CATH118249

## 2010-11-14 ENCOUNTER — Encounter: Payer: Self-pay | Admitting: Family Medicine

## 2010-11-19 ENCOUNTER — Observation Stay (HOSPITAL_COMMUNITY): Admission: EM | Admit: 2010-11-19 | Discharge: 2010-11-21 | Payer: Self-pay | Admitting: Emergency Medicine

## 2010-11-20 ENCOUNTER — Ambulatory Visit: Payer: Self-pay | Admitting: Internal Medicine

## 2010-12-05 ENCOUNTER — Ambulatory Visit: Payer: Self-pay | Admitting: Family Medicine

## 2010-12-10 ENCOUNTER — Telehealth: Payer: Self-pay | Admitting: Family Medicine

## 2010-12-12 ENCOUNTER — Ambulatory Visit: Payer: Self-pay | Admitting: Family Medicine

## 2010-12-12 ENCOUNTER — Telehealth: Payer: Self-pay | Admitting: Family Medicine

## 2010-12-18 ENCOUNTER — Telehealth: Payer: Self-pay | Admitting: Family Medicine

## 2010-12-20 ENCOUNTER — Encounter
Admission: RE | Admit: 2010-12-20 | Discharge: 2010-12-20 | Payer: Self-pay | Source: Home / Self Care | Attending: Family Medicine | Admitting: Family Medicine

## 2010-12-26 ENCOUNTER — Telehealth: Payer: Self-pay | Admitting: Family Medicine

## 2010-12-27 ENCOUNTER — Telehealth: Payer: Self-pay | Admitting: Family Medicine

## 2011-01-04 ENCOUNTER — Encounter: Payer: Self-pay | Admitting: Family Medicine

## 2011-01-22 NOTE — Assessment & Plan Note (Signed)
Summary: b12 inj/njr left delt    Nurse Visit   Allergies: 1)  Percocet (Oxycodone-Acetaminophen) 2)  Naprosyn (Naproxen)  Medication Administration  Injection # 1:    Medication: Vit B12 1000 mcg    Diagnosis: ANEMIA, B12 DEFICIENCY (ICD-281.1)    Route: IM    Site: L deltoid    Exp Date: 08/2011    Lot #: [id number]    Mfr: [place]    Patient tolerated injection without complications    Given by: Levora Angel, RN (March 06, 2010 10:23 AM)  Orders Added: 1)  Vit B12 1000 mcg [J3420] 2)  Admin of Therapeutic Inj  intramuscular or subcutaneous [id number]

## 2011-01-22 NOTE — Assessment & Plan Note (Signed)
Summary: INJ//CCM rt deltoid    Nurse Visit   Allergies: 1)  Percocet (Oxycodone-Acetaminophen) 2)  Naprosyn (Naproxen)  Medication Administration  Injection # 1:    Medication: Vit B12 1000 mcg    Diagnosis: ANEMIA, B12 DEFICIENCY (ICD-281.1)    Route: IM    Site: L deltoid    Exp Date: 08/2011    Lot #: [id number]    Mfr: [place]    Patient tolerated injection without complications    Given by: Levora Angel, RN (March 28, 2010 10:55 AM)  Orders Added: 1)  Vit B12 1000 mcg [J3420] 2)  Admin of Therapeutic Inj  intramuscular or subcutaneous [id number]

## 2011-01-22 NOTE — Assessment & Plan Note (Signed)
Summary: B12 INJ/NJR  Lt Deltoid   Nurse Visit   Allergies: 1)  Percocet (Oxycodone-Acetaminophen) 2)  Naprosyn (Naproxen)  Medication Administration  Injection # 1:    Medication: Vit B12 1000 mcg    Diagnosis: ANEMIA, B12 DEFICIENCY (ICD-281.1)    Route: IM    Site: L deltoid    Exp Date: 08/2011    Lot #: [id number]    Mfr: [place]    Patient tolerated injection without complications    Given by: S Carraway,CMA,PT  Orders Added: 1)  Vit B12 1000 mcg [J3420] 2)  Admin of Therapeutic Inj  intramuscular or subcutaneous [id number]

## 2011-01-22 NOTE — Assessment & Plan Note (Signed)
Summary: cough---cannot sleep//ccm   Vital Signs:  Patient profile:   77 year old male Weight:      159 pounds O2 Sat:      98 % Temp:     98.1 degrees F Pulse rate:   77 / minute BP sitting:   140 / 72  (left arm)  Vitals Entered By: Levora Angel, RN (May 10, 2010 11:16 AM) CC: sore throat cough and affecting his ears. stated sputum clear    Allergies: 1)  Percocet (Oxycodone-Acetaminophen) 2)  Naprosyn (Naproxen)  Past History:  Past Medical History: Last updated: 08/25/2008 GERD Hypertension carotid artery stenosis Coronary artery disease: cth '02 with 70% Cx only; last NST Nov 18th,'08 low risk study  Peptic ulcer disease Osteoarthritis  Past Surgical History: Last updated: 08/30/2008 Left foot surgery carpal tunnel surgery  left shoulder surgery hernia repair Total knee replacement Carotid endarterectomy-left  '07, s/p transient visual loss left eye traumatic amputation left index finger Ulcer surgery 1963.Marland KitchenMarland KitchenVagotomy and partial gastrectomy with Billroth I gastroenterostomy.  Social History: Last updated: 08/30/2008 married very supportive family Occupation: Retired Patient has never smoked.  Alcohol Use - no Illicit Drug Use - no  Risk Factors: Smoking Status: never (08/30/2008)  Review of Systems  The patient denies anorexia, fever, weight loss, weight gain, vision loss, decreased hearing, hoarseness, chest pain, syncope, dyspnea on exertion, peripheral edema, prolonged cough, headaches, hemoptysis, abdominal pain, melena, hematochezia, severe indigestion/heartburn, hematuria, incontinence, genital sores, muscle weakness, suspicious skin lesions, transient blindness, difficulty walking, depression, unusual weight change, abnormal bleeding, enlarged lymph nodes, angioedema, breast masses, and testicular masses.    Physical Exam  General:  Well-developed,well-nourished,in no acute distress; alert,appropriate and cooperative throughout  examination Head:  Normocephalic and atraumatic without obvious abnormalities. No apparent alopecia or balding. Eyes:  No corneal or conjunctival inflammation noted. EOMI. Perrla. Funduscopic exam benign, without hemorrhages, exudates or papilledema. Vision grossly normal. Ears:  External ear exam shows no significant lesions or deformities.  Otoscopic examination reveals clear canals, tympanic membranes are intact bilaterally without bulging, retraction, inflammation or discharge. Hearing is grossly normal bilaterally. Nose:  External nasal examination shows no deformity or inflammation. Nasal mucosa are pink and moist without lesions or exudates. Mouth:  fractures injected no anterior nodes, appears to be viral Neck:  L neck mass.   Breasts:  R palpable mass.   Lungs:  Normal respiratory effort, chest expands symmetrically. Lungs are clear to auscultation, no crackles or wheezes. Heart:  Normal rate and regular rhythm. S1 and S2 normal without gallop, murmur, click, rub or other extra sounds.   Impression & Recommendations:  Problem # 1:  ACUTE LARYNGITIS, WITH OBSTRUCTION (ICD-464.01) Assessment New vocal rest plus warm saline gargles q.i.d.  Problem # 2:  PHARYNGITIS, VIRAL (ICD-462)  His updated medication list for this problem includes:    Zithromax Z-pak 250 Mg Tabs (Azithromycin) .Marland Kitchen... 2 now then 1 once daily if sore throat gets worse  Problem # 3:  RENAL FAILURE (ICD-586) Assessment: Improved  Complete Medication List: 1)  Hydrochlorothiazide 25 Mg Tabs (Hydrochlorothiazide) .... Take 1 tablet by mouth every morning for blood pressure 2)  Lisinopril 20 Mg Tabs (Lisinopril) .... Once daily 3)  Metoprolol Succinate 25 Mg Tb24 (Metoprolol succinate) .... Once daily 4)  Vitamin C 500 Mg Chew (Ascorbic acid) .Marland Kitchen.. 1 three times a day 5)  Calcium 600/vitamin D 600-400 Mg-unit Tabs (Calcium carbonate-vitamin d) .... Once daily 6)  Align Caps (Misc intestinal flora regulat) .Marland Kitchen.. 1  each day  for irritable bowel 7)  Zithromax Z-pak 250 Mg Tabs (Azithromycin) .... 2 now then 1 qd  Patient Instructions: 1)  Viral pharyngitis 2)  salt water  gargle  4 times daily, pinch salt in water 3)  if throat gets worse and layngitis  is worse fill the Zmack prescription 4)  Vocal rest is encouraged to help laryngitis Prescriptions: ZITHROMAX Z-PAK 250 MG TABS (AZITHROMYCIN) 2 now then 1 qd  #1 pkge x 0   Entered and Authorized by:   Emeterio Reeve MD   Signed by:   Emeterio Reeve MD on 05/10/2010   Method used:   Print then Give to Patient   RxID:   [id number]

## 2011-01-22 NOTE — Assessment & Plan Note (Signed)
Summary: B12 INJ/NJR rt deltoid   Nurse Visit   Allergies: 1)  Percocet (Oxycodone-Acetaminophen) 2)  Naprosyn (Naproxen)  Medication Administration  Injection # 1:    Medication: Vit B12 1000 mcg    Diagnosis: ANEMIA, B12 DEFICIENCY (ICD-281.1)    Route: IM    Site: R deltoid    Exp Date: 08/2011    Lot #: [id number]    Mfr: [place]    Patient tolerated injection without complications    Given by: Levora Angel, RN (February 06, 2010 9:24 AM)  Orders Added: 1)  Vit B12 1000 mcg [J3420] 2)  Admin of Therapeutic Inj  intramuscular or subcutaneous [id number]

## 2011-01-22 NOTE — Assessment & Plan Note (Signed)
Summary: B-12 INJECTION // RS   Nurse Visit   Allergies: 1)  Percocet (Oxycodone-Acetaminophen) 2)  Naprosyn (Naproxen)  Medication Administration  Injection # 1:    Medication: Vit B12 1000 mcg    Diagnosis: ANEMIA, B12 DEFICIENCY (ICD-281.1)    Route: IM    Site: L deltoid    Exp Date: 03/24/2011    Lot #: [id number]    Mfr: [place]    Patient tolerated injection without complications    Given by: Sherron Monday, CMA ([place]) (January 09, 2010 9:10 AM)  Orders Added: 1)  Vit B12 1000 mcg [J3420] 2)  Admin of Therapeutic Inj  intramuscular or subcutaneous [id number]

## 2011-01-22 NOTE — Assessment & Plan Note (Signed)
Summary: b12 inj/nj left delt    Nurse Visit   Allergies: 1)  Percocet (Oxycodone-Acetaminophen) 2)  Naprosyn (Naproxen)  Medication Administration  Injection # 1:    Medication: Vit B12 1000 mcg    Diagnosis: ANEMIA, B12 DEFICIENCY (ICD-281.1)    Route: IM    Site: L deltoid    Exp Date: 08/2011    Lot #: [id number]    Mfr: [place]    Patient tolerated injection without complications    Given by: Levora Angel, RN (May 23, 2010 11:13 AM)  Orders Added: 1)  Vit B12 1000 mcg [J3420] 2)  Admin of Therapeutic Inj  intramuscular or subcutaneous [id number]

## 2011-01-22 NOTE — Miscellaneous (Signed)
Summary: TD inj from [facility] 05-30-2010   Clinical Lists Changes  Observations: Added new observation of TD BOOSTER: Td (05/30/2010 15:51)      Immunization History:  Tetanus/Td Immunization History:    Tetanus/Td:  td (05/30/2010) pt received at [facility] .gh rn........Marland Kitchen

## 2011-01-24 NOTE — Progress Notes (Signed)
Summary: Pt needs referral for MRI on shoulder asap. Pt in pain  Phone Note Call from Patient Call back at Home Phone [phone]   Caller: spouse-[name] Summary of Call: Pt is wanting referral for MRI asap for pain in shoulder (damaged rotary cuff). Pt is in a lot of pain. Pls call with referral info.   Initial call taken by: Braulio Bosch,  December 18, 2010 3:47 PM  Follow-up for Phone Call        0Pt has requested to switch PCP to Dr. Elease Hashimoto. I called pt, informed him Dr Elease Hashimoto is out of the office until tomorrow, pt voiced his understainding. Nira Conn LPN  December 27, 624THL 5:17 PM This has already been ordered-see orders and last office visit. Let Coralyn Mark know but I'm sure she is working on. Follow-up by: Carolann Littler MD,  December 18, 2010 6:21 PM

## 2011-01-24 NOTE — Assessment & Plan Note (Signed)
Summary: severe pain under shoulder blades and across chest/no heart p...   Vital Signs:  Patient profile:   77 year old male Weight:      150 pounds Temp:     97.8 degrees F oral BP sitting:   150 / 68  (left arm) Cuff size:   regular  Vitals Entered By: Nira Conn LPN (December 14, 624THL 11:49 AM)  History of Present Illness: Patient seen with chief complaint of couple of months of left periscapular pain.  Onset around October. No injury. He describes sharp pain around 8/10 in severity at worst left periscapular and radiates anterior.  Patient admitted on November 20 to hospital with chest pain and concern for unstable angina. MI ruled out. Patient had elevated d-dimer but a V/Q scan low probability for pulmonary embolus. Cardiac catheterization revealed no critical obstruction. Medical therapy continued. Abdominal ultrasound showed gallstone but no evidence for cholecystitis. LFTs normal. Mild renal insufficiency and thiazide discontinued. Chest x-ray showed some hyperinflation but no acute change.  hospital records are reviewed.  Patient recently saw cardiologist prescribed tramadol and prednisone and thus far very little relief. Denies cough. No pleuritic pain. No hemoptysis, fever, or chills. Appetite okay. Mild weight loss since last spring.  Hypertension History:      He denies headache, chest pain, palpitations, dyspnea with exertion, orthopnea, PND, peripheral edema, visual symptoms, neurologic problems, syncope, and side effects from treatment.        Positive major cardiovascular risk factors include male age 63 years old or older, hyperlipidemia, and hypertension.  Negative major cardiovascular risk factors include non-tobacco-user status.        Positive history for target organ damage include ASHD (either angina/prior MI/prior CABG).    Allergies: 1)  Percocet (Oxycodone-Acetaminophen) 2)  Naprosyn (Naproxen)  Past History:  Past Medical History: Last updated:  08/25/2008 GERD Hypertension carotid artery stenosis Coronary artery disease: cth '02 with 70% Cx only; last NST Nov 18th,'08 low risk study  Peptic ulcer disease Osteoarthritis  Past Surgical History: Last updated: 08/30/2008 Left foot surgery carpal tunnel surgery  left shoulder surgery hernia repair Total knee replacement Carotid endarterectomy-left  '07, s/p transient visual loss left eye traumatic amputation left index finger Ulcer surgery 1963.Marland KitchenMarland KitchenVagotomy and partial gastrectomy with Billroth I gastroenterostomy.  Family History: Last updated: 11/04/2008  Family History of Diabetes:Sister No FH of Colon Cancer: Family History of Heart Disease: Sister  Social History: Last updated: 08/30/2008 married very supportive family Occupation: Retired Patient has never smoked.  Alcohol Use - no Illicit Drug Use - no  Risk Factors: Caffeine Use: 0 (08/30/2008) Exercise: no (08/30/2008)  Risk Factors: Smoking Status: never (08/30/2008) PMH-FH-SH reviewed for relevance  Review of Systems       The patient complains of chest pain.  The patient denies anorexia, fever, weight gain, vision loss, hoarseness, syncope, dyspnea on exertion, peripheral edema, prolonged cough, headaches, hemoptysis, abdominal pain, melena, hematochezia, severe indigestion/heartburn, muscle weakness, and transient blindness.    Physical Exam  General:  Well-developed,well-nourished,in no acute distress; alert,appropriate and cooperative throughout examination Eyes:  pupils equal, pupils round, and pupils reactive to light.   Mouth:  Oral mucosa and oropharynx without lesions or exudates.  Teeth in good repair. Neck:  No deformities, masses, or tenderness noted. Lungs:  Normal respiratory effort, chest expands symmetrically. Lungs are clear to auscultation, no crackles or wheezes. Heart:  normal rate, regular rhythm, and no rub.   Abdomen:  Bowel sounds positive,abdomen soft and non-tender without  masses, organomegaly  or hernias noted. Msk:  patient has tenderness to palpation left upper back area over area about 2 x 3 cm medial upper scapular region. No ecchymosis or any visible swelling Extremities:  no edema Neurologic:  alert & oriented X3, cranial nerves II-XII intact, and strength normal in all extremities.   Skin:  no rashes, no suspicious lesions, and no ecchymoses.   Cervical Nodes:  No lymphadenopathy noted Psych:  normally interactive, good eye contact, not anxious appearing, and not depressed appearing.     Impression & Recommendations:  Problem # 1:  BACK PAIN, UPPER (ICD-724.5)  suspect more muscular. Recommend topical rub such as Biofreeze.  Discussed options including corticosteroid injection as he seems to have trigger point in the muscle and after reviewing risks and benefits he agreed. Prepped skin with alcohol. Using 1 inch 25-gauge needle injected 40 mg Depo-Medrol 1 cc plain Xylocaine and the area of tenderness. Tolerated well His updated medication list for this problem includes:    Aspirin 325 Mg Tabs (Aspirin) ..... Once daily    Tramadol Hcl 50 Mg Tabs (Tramadol hcl) ..... One tab every 6 hours as needed pain  Orders: Trigger Point Injection (1 or 2 muscles) [id number])  Problem # 2:  CORONARY ARTERY DISEASE (ICD-414.00) recent cardiac cath no critical obstruction. The following medications were removed from the medication list:    Hydrochlorothiazide 25 Mg Tabs (Hydrochlorothiazide) .Marland Kitchen... Take 1 tablet by mouth every morning for blood pressure His updated medication list for this problem includes:    Lisinopril 20 Mg Tabs (Lisinopril) ..... Once daily    Metoprolol Succinate 25 Mg Tb24 (Metoprolol succinate) ..... Once daily    Aspirin 325 Mg Tabs (Aspirin) ..... Once daily    Nitrostat 0.4 Mg Subl (Nitroglycerin) ..... Every 5 minutes as needed, up to 3 doses as needed    Amlodipine Besylate 5 Mg Tabs (Amlodipine besylate) ..... Once daily  Problem # 3:   HYPERTENSION (ICD-401.9)  The following medications were removed from the medication list:    Hydrochlorothiazide 25 Mg Tabs (Hydrochlorothiazide) .Marland Kitchen... Take 1 tablet by mouth every morning for blood pressure His updated medication list for this problem includes:    Lisinopril 20 Mg Tabs (Lisinopril) ..... Once daily    Metoprolol Succinate 25 Mg Tb24 (Metoprolol succinate) ..... Once daily    Amlodipine Besylate 5 Mg Tabs (Amlodipine besylate) ..... Once daily  Complete Medication List: 1)  Lisinopril 20 Mg Tabs (Lisinopril) .... Once daily 2)  Metoprolol Succinate 25 Mg Tb24 (Metoprolol succinate) .... Once daily 3)  Vitamin C 500 Mg Chew (Ascorbic acid) .Marland Kitchen.. 1 three times a day 4)  Calcium 600/vitamin D 600-400 Mg-unit Tabs (Calcium carbonate-vitamin d) .... Once daily 5)  Align Caps (Misc intestinal flora regulat) .Marland Kitchen.. 1 each day for irritable bowel 6)  Aspirin 325 Mg Tabs (Aspirin) .... Once daily 7)  Nitrostat 0.4 Mg Subl (Nitroglycerin) .... Every 5 minutes as needed, up to 3 doses as needed 8)  Tramadol Hcl 50 Mg Tabs (Tramadol hcl) .... One tab every 6 hours as needed pain 9)  Simvastatin 20 Mg Tabs (Simvastatin) .... Once daily 10)  Amlodipine Besylate 5 Mg Tabs (Amlodipine besylate) .... Once daily 11)  Travatan Z 0.004 % Soln (Travoprost) .... One drop to each eye at bedtime  Hypertension Assessment/Plan:      The patient's hypertensive risk group is category C: Target organ damage and/or diabetes.  Today's blood pressure is 150/68.      Orders Added: 1)  Est.  Patient Level IV [id number] 2)  Trigger Point Injection (1 or 2 muscles) [id number]

## 2011-01-24 NOTE — Letter (Signed)
Summary: Discharge Summary / [facility]  Discharge Summary / [place]   Imported By: Rise Patience 12/12/2010 10:50:15  _____________________________________________________________________  External Attachment:    Type:   Image     Comment:   External Document

## 2011-01-24 NOTE — Progress Notes (Signed)
Summary: [place] ortho appt made, fax MRI  Phone Note Call from Patient Call back at [facility] Phone [phone]   Caller: Patient Call For: Carolann Littler MD Summary of Call: Pt made appt with Antionette Char, he did not need a referral.  They would like MRI faxed.  This was done. FYI Initial call taken by: Nira Conn LPN,  January  4, [id number] 2:12 PM  Follow-up for Phone Call        noted Follow-up by: Carolann Littler MD,  December 26, 2010 3:16 PM

## 2011-01-24 NOTE — Progress Notes (Signed)
Summary: Change of PCP request  Phone Note Call from Patient   Caller: Patient Call For: Nicholas Reeve MD Summary of Call: Pt here to see Dr Elease Hashimoto for shoulder pain.  He could not get in to see Dr Joni Fears due to his limited schedule availability,  He has seen Dr Elease Hashimoto before and likes him also.  Pt requesting change of PCP to Dr Elease Hashimoto. Initial call taken by: Nira Conn LPN,  December 21, 624THL 10:51 AM  Follow-up for Phone Call        OK with me Follow-up by: Carolann Littler MD,  December 13, 2010 12:44 PM  Additional Follow-up for Phone Call Additional follow up Details #1::        Is this OK with you Dr Carolann Littler [facility] LPN  December 22, 624THL 2:53 PM     Additional Follow-up for Phone Call Additional follow up Details #2::    Per Dr. Joni Fears ok to switch Follow-up by: Candace Cruise,  December 13, 2010 3:57 PM

## 2011-01-24 NOTE — Progress Notes (Signed)
  Phone Note Call from Patient   Caller: Patient Call For: Carolann Littler MD Summary of Call: Amlodopine is giving him hives, and swelling of the lips and face, and Dr. Rex Kras took him off of it.  Will post as an allergy.  Initial call taken by: Deanna Artis CMA AAMA,  December 27, 2010 11:35 AM  Follow-up for Phone Call        noted Follow-up by: Carolann Littler MD,  December 27, 2010 1:13 PM   New Allergies: ! NORVASC (AMLODIPINE BESYLATE) New Allergies: ! NORVASC (AMLODIPINE BESYLATE)

## 2011-01-24 NOTE — Assessment & Plan Note (Signed)
Summary: shoulder trauma/dm   Vital Signs:  Patient profile:   77 year old male Weight:      146 pounds Temp:     98.1 degrees F oral BP sitting:   120 / 60  (left arm) Cuff size:   regular  Vitals Entered By: Nira Conn LPN (December 21, 624THL 10:39 AM)  History of Present Illness: This past Sunday getting out of bed and grabbing matress and felt "pop"  L shoulder. Similar pain with rotator cuff sev years ago. Pain is moderate.  Sharp pain.  Radiates to back.  Worse with movement.  No meds tried.  this pain is different from back pain from last visit.  Back pain some better following steroid injection. Current pain similar to that of roatator cuff tear several years ago.  Allergies: 1)  Percocet (Oxycodone-Acetaminophen) 2)  Naprosyn (Naproxen)  Past History:  Past Medical History: Last updated: 08/25/2008 GERD Hypertension carotid artery stenosis Coronary artery disease: cth '02 with 70% Cx only; last NST Nov 18th,'08 low risk study  Peptic ulcer disease Osteoarthritis PMH reviewed for relevance  Review of Systems  The patient denies chest pain, syncope, dyspnea on exertion, and peripheral edema.    Physical Exam  General:  Well-developed,well-nourished,in no acute distress; alert,appropriate and cooperative throughout examination Mouth:  Oral mucosa and oropharynx without lesions or exudates.  Teeth in good repair. Neck:  No deformities, masses, or tenderness noted. Lungs:  Normal respiratory effort, chest expands symmetrically. Lungs are clear to auscultation, no crackles or wheezes. Heart:  normal rate and regular rhythm.   Extremities:  L shoulder scar from prior surgery.  Pain with abduction and int rotation.  ?weakness vs pain limiting rotation.  No erythema or ecchymosis.  Fairly good ROM but signif pain with abduction.  No AC joint tenderness.   Impression & Recommendations:  Problem # 1:  SHOULDER PAIN (ICD-719.41) Assessment New  ?rotator cuff  tear.  Will schedule MRI to further assess. His updated medication list for this problem includes:    Aspirin 325 Mg Tabs (Aspirin) ..... Once daily    Tramadol Hcl 50 Mg Tabs (Tramadol hcl) ..... One tab every 6 hours as needed pain  Orders: Radiology Referral (Radiology)  Complete Medication List: 1)  Lisinopril 20 Mg Tabs (Lisinopril) .... Once daily 2)  Metoprolol Succinate 25 Mg Tb24 (Metoprolol succinate) .... Once daily 3)  Vitamin C 500 Mg Chew (Ascorbic acid) .Marland Kitchen.. 1 three times a day 4)  Calcium 600/vitamin D 600-400 Mg-unit Tabs (Calcium carbonate-vitamin d) .... Once daily 5)  Align Caps (Misc intestinal flora regulat) .Marland Kitchen.. 1 each day for irritable bowel 6)  Aspirin 325 Mg Tabs (Aspirin) .... Once daily 7)  Nitrostat 0.4 Mg Subl (Nitroglycerin) .... Every 5 minutes as needed, up to 3 doses as needed 8)  Tramadol Hcl 50 Mg Tabs (Tramadol hcl) .... One tab every 6 hours as needed pain 9)  Simvastatin 20 Mg Tabs (Simvastatin) .... Once daily 10)  Amlodipine Besylate 5 Mg Tabs (Amlodipine besylate) .... Once daily 11)  Travatan Z 0.004 % Soln (Travoprost) .... One drop to each eye at bedtime   Orders Added: 1)  Radiology Referral [Radiology] 2)  Est. Patient Level III [id number]

## 2011-01-24 NOTE — Progress Notes (Signed)
Summary: MRI  Phone Note Call from Patient Call back at Home Phone [phone]   Caller: Patient Call For: Nicholas Reeve MD Summary of Call: Pt remembers an old rotator cuff injury and felt a pop Sunday in his shoulder,  Would like to have an MRI. Initial call taken by: [facility] CMA AAMA,  December 10, 2010 8:37 AM  Follow-up for Phone Call        If new injury since last visit, he needs to be reasessed before MRI ordered. Follow-up by: Carolann Littler MD,  December 10, 2010 8:58 AM  Additional Follow-up for Phone Call Additional follow up Details #1::        Appt scheduled. Additional Follow-up by: Deanna Artis CMA AAMA,  December 10, 2010 9:11 AM

## 2011-01-30 NOTE — Consult Note (Signed)
Summary: [facility]   Imported By: Laural Benes 01/21/2011 09:10:05  _____________________________________________________________________  External Attachment:    Type:   Image     Comment:   External Document

## 2011-02-13 ENCOUNTER — Other Ambulatory Visit: Payer: Self-pay | Admitting: Family Medicine

## 2011-03-05 LAB — DIFFERENTIAL
Lymphocytes Relative: 22 % (ref 12–46)
Monocytes Absolute: 0.9 10*3/uL (ref 0.1–1.0)
Monocytes Relative: 10 % (ref 3–12)
Neutro Abs: 5.9 10*3/uL (ref 1.7–7.7)
Neutrophils Relative %: 66 % (ref 43–77)

## 2011-03-05 LAB — HEPATIC FUNCTION PANEL
AST: 25 U/L (ref 0–37)
Albumin: 3.2 g/dL — ABNORMAL LOW (ref 3.5–5.2)
Alkaline Phosphatase: 61 U/L (ref 39–117)
Total Bilirubin: 0.6 mg/dL (ref 0.3–1.2)

## 2011-03-05 LAB — BASIC METABOLIC PANEL
BUN: 27 mg/dL — ABNORMAL HIGH (ref 6–23)
BUN: 37 mg/dL — ABNORMAL HIGH (ref 6–23)
BUN: 40 mg/dL — ABNORMAL HIGH (ref 6–23)
CO2: 17 mEq/L — ABNORMAL LOW (ref 19–32)
CO2: 20 mEq/L (ref 19–32)
CO2: 21 mEq/L (ref 19–32)
CO2: 21 mEq/L (ref 19–32)
Calcium: 8.7 mg/dL (ref 8.4–10.5)
Chloride: 114 mEq/L — ABNORMAL HIGH (ref 96–112)
Chloride: 115 mEq/L — ABNORMAL HIGH (ref 96–112)
Chloride: 115 mEq/L — ABNORMAL HIGH (ref 96–112)
Chloride: 120 mEq/L — ABNORMAL HIGH (ref 96–112)
Creatinine, Ser: 1.92 mg/dL — ABNORMAL HIGH (ref 0.4–1.5)
Creatinine, Ser: 2.06 mg/dL — ABNORMAL HIGH (ref 0.4–1.5)
GFR calc Af Amer: 33 mL/min — ABNORMAL LOW (ref >60)
GFR calc Af Amer: 41 mL/min — ABNORMAL LOW (ref >60)
GFR calc Af Amer: 55 mL/min — ABNORMAL LOW (ref >60)
GFR calc non Af Amer: 27 mL/min — ABNORMAL LOW (ref >60)
Glucose, Bld: 117 mg/dL — ABNORMAL HIGH (ref 70–99)
Glucose, Bld: 125 mg/dL — ABNORMAL HIGH (ref 70–99)
Glucose, Bld: 83 mg/dL (ref 70–99)
Potassium: 4 mEq/L (ref 3.5–5.1)
Potassium: 4 mEq/L (ref 3.5–5.1)
Potassium: 4.4 mEq/L (ref 3.5–5.1)
Potassium: 5.4 mEq/L — ABNORMAL HIGH (ref 3.5–5.1)
Sodium: 139 mEq/L (ref 135–145)
Sodium: 140 mEq/L (ref 135–145)
Sodium: 143 mEq/L (ref 135–145)

## 2011-03-05 LAB — COMPREHENSIVE METABOLIC PANEL
ALT: 13 U/L (ref 0–53)
AST: 18 U/L (ref 0–37)
Albumin: 3.2 g/dL — ABNORMAL LOW (ref 3.5–5.2)
Albumin: 3.2 g/dL — ABNORMAL LOW (ref 3.5–5.2)
Alkaline Phosphatase: 58 U/L (ref 39–117)
BUN: 24 mg/dL — ABNORMAL HIGH (ref 6–23)
CO2: 21 mEq/L (ref 19–32)
Calcium: 8.8 mg/dL (ref 8.4–10.5)
Chloride: 112 mEq/L (ref 96–112)
Creatinine, Ser: 1.48 mg/dL (ref 0.4–1.5)
GFR calc Af Amer: 33 mL/min — ABNORMAL LOW (ref >60)
GFR calc non Af Amer: 46 mL/min — ABNORMAL LOW (ref >60)
Glucose, Bld: 88 mg/dL (ref 70–99)
Glucose, Bld: 96 mg/dL (ref 70–99)
Potassium: 4.1 mEq/L (ref 3.5–5.1)
Sodium: 142 mEq/L (ref 135–145)
Total Bilirubin: 0.7 mg/dL (ref 0.3–1.2)
Total Protein: 5.9 g/dL — ABNORMAL LOW (ref 6.0–8.3)

## 2011-03-05 LAB — CARDIAC PANEL(CRET KIN+CKTOT+MB+TROPI)
CK, MB: 1.8 ng/mL (ref 0.3–4.0)
CK, MB: 1.9 ng/mL (ref 0.3–4.0)
CK, MB: 2 ng/mL (ref 0.3–4.0)
CK, MB: 2.3 ng/mL (ref 0.3–4.0)
CK, MB: 2.4 ng/mL (ref 0.3–4.0)
CK, MB: 2.9 ng/mL (ref 0.3–4.0)
Relative Index: INVALID (ref 0.0–2.5)
Total CK: 47 U/L (ref 7–232)
Total CK: 91 U/L (ref 7–232)
Troponin I: 0.01 ng/mL (ref 0.00–0.06)
Troponin I: 0.01 ng/mL (ref 0.00–0.06)
Troponin I: 0.01 ng/mL (ref 0.00–0.06)
Troponin I: 0.02 ng/mL (ref 0.00–0.06)

## 2011-03-05 LAB — APTT: aPTT: 27 seconds (ref 24–37)

## 2011-03-05 LAB — CBC
HCT: 33.2 % — ABNORMAL LOW (ref 39.0–52.0)
HCT: 35.5 % — ABNORMAL LOW (ref 39.0–52.0)
HCT: 36.1 % — ABNORMAL LOW (ref 39.0–52.0)
HCT: 37.1 % — ABNORMAL LOW (ref 39.0–52.0)
Hemoglobin: 11.5 g/dL — ABNORMAL LOW (ref 13.0–17.0)
Hemoglobin: 13.2 g/dL (ref 13.0–17.0)
MCH: 34.2 pg — ABNORMAL HIGH (ref 26.0–34.0)
MCH: 34.7 pg — ABNORMAL HIGH (ref 26.0–34.0)
MCV: 97.3 fL (ref 78.0–100.0)
MCV: 97.3 fL (ref 78.0–100.0)
Platelets: 93 10*3/uL — ABNORMAL LOW (ref 150–400)
RBC: 3.4 MIL/uL — ABNORMAL LOW (ref 4.22–5.81)
RBC: 3.71 MIL/uL — ABNORMAL LOW (ref 4.22–5.81)
RBC: 3.8 MIL/uL — ABNORMAL LOW (ref 4.22–5.81)
RDW: 13.5 % (ref 11.5–15.5)
WBC: 6.6 10*3/uL (ref 4.0–10.5)
WBC: 9 10*3/uL (ref 4.0–10.5)

## 2011-03-05 LAB — CK TOTAL AND CKMB (NOT AT ARMC)
CK, MB: 2.4 ng/mL (ref 0.3–4.0)
Relative Index: INVALID (ref 0.0–2.5)
Total CK: 53 U/L (ref 7–232)

## 2011-03-05 LAB — PROTIME-INR: Prothrombin Time: 13.7 seconds (ref 11.6–15.2)

## 2011-03-05 LAB — HEMOGLOBIN A1C
Hgb A1c MFr Bld: 5.2 % (ref <5.7)
Mean Plasma Glucose: 103 mg/dL (ref <117)

## 2011-03-05 LAB — TROPONIN I: Troponin I: 0.01 ng/mL (ref 0.00–0.06)

## 2011-03-05 LAB — POCT CARDIAC MARKERS: Myoglobin, poc: 157 ng/mL (ref 12–200)

## 2011-03-05 LAB — LIPID PANEL
LDL Cholesterol: 53 mg/dL (ref 0–99)
Triglycerides: 117 mg/dL (ref <150)
VLDL: 23 mg/dL (ref 0–40)

## 2011-03-05 LAB — HEMOCCULT GUIAC POC 1CARD (OFFICE): Fecal Occult Bld: NEGATIVE

## 2011-03-05 LAB — D-DIMER, QUANTITATIVE: D-Dimer, Quant: 1.15 ug/mL-FEU — ABNORMAL HIGH (ref 0.00–0.48)

## 2011-03-05 LAB — MRSA PCR SCREENING: MRSA by PCR: NEGATIVE

## 2011-03-05 LAB — HEPARIN LEVEL (UNFRACTIONATED): Heparin Unfractionated: 0.28 IU/mL — ABNORMAL LOW (ref 0.30–0.70)

## 2011-03-06 LAB — DIFFERENTIAL
Basophils Absolute: 0 K/uL (ref 0.0–0.1)
Basophils Relative: 0 % (ref 0–1)
Eosinophils Absolute: 0.2 K/uL (ref 0.0–0.7)
Eosinophils Relative: 3 % (ref 0–5)
Lymphocytes Relative: 35 % (ref 12–46)
Lymphs Abs: 2.2 10*3/uL (ref 0.7–4.0)
Monocytes Absolute: 0.5 10*3/uL (ref 0.1–1.0)
Monocytes Relative: 9 % (ref 3–12)
Neutro Abs: 3.3 K/uL (ref 1.7–7.7)
Neutrophils Relative %: 53 % (ref 43–77)

## 2011-03-06 LAB — PROTIME-INR
INR: 1 (ref 0.00–1.49)
Prothrombin Time: 13.4 seconds (ref 11.6–15.2)

## 2011-03-06 LAB — BASIC METABOLIC PANEL WITH GFR
BUN: 50 mg/dL — ABNORMAL HIGH (ref 6–23)
Calcium: 9.4 mg/dL (ref 8.4–10.5)
Creatinine, Ser: 1.69 mg/dL — ABNORMAL HIGH (ref 0.4–1.5)
GFR calc non Af Amer: 39 mL/min — ABNORMAL LOW (ref >60)
Glucose, Bld: 85 mg/dL (ref 70–99)
Sodium: 142 meq/L (ref 135–145)

## 2011-03-06 LAB — POCT CARDIAC MARKERS
CKMB, poc: 1.4 ng/mL (ref 1.0–8.0)
CKMB, poc: <1 ng/mL — ABNORMAL LOW (ref 1.0–8.0)
Myoglobin, poc: 130 ng/mL (ref 12–200)
Myoglobin, poc: 76.8 ng/mL (ref 12–200)
Troponin i, poc: <0.05 ng/mL (ref 0.00–0.09)
Troponin i, poc: <0.05 ng/mL (ref 0.00–0.09)

## 2011-03-06 LAB — CBC
HCT: 41.1 % (ref 39.0–52.0)
Hemoglobin: 14.6 g/dL (ref 13.0–17.0)
MCH: 35.7 pg — ABNORMAL HIGH (ref 26.0–34.0)
MCHC: 35.5 g/dL (ref 30.0–36.0)
MCV: 100.3 fL — ABNORMAL HIGH (ref 78.0–100.0)
Platelets: 103 K/uL — ABNORMAL LOW (ref 150–400)
RBC: 4.09 MIL/uL — ABNORMAL LOW (ref 4.22–5.81)
RDW: 14 % (ref 11.5–15.5)
WBC: 6.3 10*3/uL (ref 4.0–10.5)

## 2011-03-06 LAB — BASIC METABOLIC PANEL
CO2: 19 mEq/L (ref 19–32)
Chloride: 115 mEq/L — ABNORMAL HIGH (ref 96–112)
GFR calc Af Amer: 48 mL/min — ABNORMAL LOW (ref >60)
Potassium: 4.6 mEq/L (ref 3.5–5.1)

## 2011-03-06 LAB — APTT: aPTT: 28 s (ref 24–37)

## 2011-03-12 ENCOUNTER — Encounter: Payer: Self-pay | Admitting: Internal Medicine

## 2011-03-12 ENCOUNTER — Ambulatory Visit (INDEPENDENT_AMBULATORY_CARE_PROVIDER_SITE_OTHER): Payer: PRIVATE HEALTH INSURANCE | Admitting: Internal Medicine

## 2011-03-12 DIAGNOSIS — I2510 Atherosclerotic heart disease of native coronary artery without angina pectoris: Secondary | ICD-10-CM

## 2011-03-12 DIAGNOSIS — I10 Essential (primary) hypertension: Secondary | ICD-10-CM

## 2011-03-12 DIAGNOSIS — R238 Other skin changes: Secondary | ICD-10-CM

## 2011-03-12 NOTE — Progress Notes (Signed)
  Subjective:    Patient ID: Nicholas Porter, male    DOB: 1930/11/27, 77 y.o.   MRN: [id number]  HPI    77 year old patient who has a history of coronary artery disease. He has been on aspirin 325 mg daily. For the past couple of months he has had excessive bruising over the arms primarily a few days ago he had some brief low-volume self-limited epistaxis. For the past few days he has discontinued aspirin therapy he has had no bruising except involving the outer aspects of both arms    Review of Systems  Constitutional: Negative for fever, chills, appetite change and fatigue.  HENT: Negative for hearing loss, ear pain, congestion, sore throat, trouble swallowing, neck stiffness, dental problem, voice change and tinnitus.   Eyes: Negative for pain, discharge and visual disturbance.  Respiratory: Negative for cough, chest tightness, wheezing and stridor.   Cardiovascular: Negative for chest pain, palpitations and leg swelling.  Gastrointestinal: Negative for nausea, vomiting, abdominal pain, diarrhea, constipation, blood in stool and abdominal distention.  Genitourinary: Negative for urgency, hematuria, flank pain, discharge, difficulty urinating and genital sores.  Musculoskeletal: Negative for myalgias, back pain, joint swelling, arthralgias and gait problem.  Skin: Positive for rash.       [ Easy bruisability with mild trauma Neurological: Negative for dizziness, syncope, speech difficulty, weakness, numbness and headaches.  Hematological: Negative for adenopathy. Does not bruise/bleed easily.  Psychiatric/Behavioral: Negative for behavioral problems and dysphoric mood. The patient is not nervous/anxious.        Objective:   Physical Exam  Constitutional: He is oriented to person, place, and time. He appears well-developed.  HENT:  Head: Normocephalic.  Right Ear: External ear normal.  Left Ear: External ear normal.  Eyes: Conjunctivae and EOM are normal.  Neck: Normal range of  motion.  Cardiovascular: Normal rate and normal heart sounds.   Pulmonary/Chest: Breath sounds normal.  Abdominal: Bowel sounds are normal.  Musculoskeletal: Normal range of motion. He exhibits no edema and no tenderness.  Neurological: He is alert and oriented to person, place, and time.  Skin:        Scattered ecchymoses were noted involving the outer aspects of both arms left much more prominent than the right. No hematoma formation or ecchymoses  Noted at other sites.  Psychiatric: He has a normal mood and affect. His behavior is normal.          Assessment & Plan:   aspirin associated easy bruisability  Coronary artery disease   Have recommended to the patient that he start taking 81 mg of aspirin daily unless more serious bleeding episodes develop

## 2011-03-12 NOTE — Patient Instructions (Signed)
Decrease aspirin to 81 mg daily Call or return to clinic prn if these symptoms worsen or fail to improve as anticipated.

## 2011-05-07 NOTE — Discharge Summary (Signed)
NAME:  Nicholas Porter, Nicholas Porter              ACCOUNT NO.:  [id number]   MEDICAL RECORD NO.:  [id number]          PATIENT TYPE:  INP   LOCATION:  [place]                         FACILITY:  [facility]   PHYSICIAN:  Valerie A. Asa Lente, MDDATE OF BIRTH:  [birth year]   DATE OF ADMISSION:  11/27/2008  DATE OF DISCHARGE:  11/30/2008                               DISCHARGE SUMMARY   PRIMARY CARE PHYSICIAN:  Dr. Tempie Hoist.   DISCHARGE DIAGNOSES:  1. Acute sigmoid diverticulitis.  2. Thrombocytopenia.  3. Acute renal insufficiency on admission.   HISTORY OF PRESENT ILLNESS:  Nicholas Porter is a very pleasant 77 year old  white male with history of coronary artery disease, hypertension and  GERD, who presented to [facility] emergency room on day of admission  with reports of left lower quadrant abdominal pain.  The patient denied  any fever, nausea, vomiting or diarrhea prior to admit.  CT scan  performed in the emergency room revealed diverticulosis of the sigmoid  colon with stranding adjacent to the sigmoid colon, consistent with  acute diverticulitis.  The patient was admitted at that time for further  evaluation and treatment.   PAST MEDICAL HISTORY:  1. CAD, status post cardiac cath in 2002, with 70% circumflex lesion,      normal stress test, November 2008.  2. GERD.  3. Subtotal gastrectomy remotely for peptic ulcer disease.  4. History of carotid artery stenosis, status post left endarterectomy      in 2007.  5. Hypertension.  6. Osteoarthritis.  7. Left total knee replacement.  8. Amputation of left index finger.  9. Corneal lens implant.   COURSE OF HOSPITALIZATION:  1. Acute sigmoid diverticulitis.  The patient admitted from emergency      room and placed on IV Cipro and Flagyl.  The patient responded well      to antibiotic treatment with decreased white cell count from 12.5-      8.8.  The patient has remained afebrile throughout hospitalization.      He is tolerating a regular diet  without any nausea, vomiting,      diarrhea or abdominal pain.  The patient to continue on p.o. Cipro      and Flagyl for a total of 7 days treatment.  Patient with mild      renal insufficiency at time of admission with creatinine of 1.6,      quickly resolving with gentle IV fluid hydration.  Discharge      creatinine of 1.35.  Patient okay to resume meds as prior to this      admission.  2. Thrombocytopenia.  Apparently, the patient has been followed for      this in the past by primary care physician, as well as being seen      by Dr. Julien Nordmann for same.  Platelet count has remained low but      stable throughout hospitalization without any clinical evidence of      bleeding.  Would recommend close outpatient follow-up with primary      care physician with reevaluation by hematology for possible bone  marrow biopsy.   DISCHARGE MEDICATIONS:  1. Cipro 500 mg p.o. t.i.d. until gone.  2. Flagyl 500 mg p.o. q.i.d. until gone.  3. Metoprolol 25 mg p.o. daily.  4. Pamine 2.5 mg p.o. nightly.  5. Hydrochlorothiazide 25 mg p.o. daily.  6. Lisinopril 20 mg once daily.  7. Vitamin C.  8. Travatan eye drops 1 drop each eye nightly.   PERTINENT LAB WORK AT TIME OF DISCHARGE:  White cell count 8.2, platelet  count 73, hemoglobin 12.5, hematocrit 36.2, sodium 140, potassium 4.6,  BUN 20, creatinine 1.35.   DISPOSITION:  The patient felt medically stable for discharge home at  this time.  The patient has been instructed to call primary care  physician for follow-up appointment in 1-2 weeks.      Patrici Ranks, NP      Jannifer Rodney. Asa Lente, MD  Electronically Signed    LE/MEDQ  D:  11/30/2008  T:  11/30/2008  Job:  [id number]   cc:   Shanna Cisco., MD  [place]  [place] [address]

## 2011-05-07 NOTE — Discharge Summary (Signed)
NAME:  Nicholas Porter, Nicholas Porter              ACCOUNT NO.:  [id number]   MEDICAL RECORD NO.:  [id number]          PATIENT TYPE:  OBV   LOCATION:  13                         FACILITY:  [facility]   PHYSICIAN:  Valerie A. Asa Lente, MDDATE OF BIRTH:  22-Nov-1930   DATE OF ADMISSION:  05/22/2008  DATE OF DISCHARGE:  05/23/2008                               DISCHARGE SUMMARY   DISCHARGE DIAGNOSES:  1. Chest pain thought secondary to costochondritis.  2. History of coronary artery disease.   HISTORY OF PRESENT ILLNESS:  Nicholas Porter is a very pleasant 77 year old  white male with known history of CAD with normal Myoview stress test in  November 2008.  The patient reported to the emergency room on day of  admission with a 1-week history of intermittent left upper chest wall  pain radiating to the left scapula.  The patient denied any worsening of  pain on exertion, states pain resolves when lying supine and is  exacerbated by sitting or standing.  The patient denied any diaphoresis,  states he did have pain with inspiration only in setting of left upper  chest pain.  The patient denied any fever, rigors, nausea or vomiting.  Upon evaluation in the ER, initial EKG revealed an old septal injury  with no acute changes.  Cardiac enzymes were normal.  Due to the  patient's history of known CAD, the patient was admitted at time for  further evaluation and treatment.   PAST MEDICAL HISTORY:  1. History of CAD with a catheterization in 2002 with 70% circumflex      lesion, normal stress test in November 2008.  2. GERD.  3. Hypertension.  4. Carotid artery stenosis status post left endarterectomy in 2007.  5. Peptic ulcer disease.  6. Osteoarthritis.  7. Total knee replacement.  8. Hernia repair.  9. Carpal tunnel surgery.  10.Left shoulder surgery.   COURSE OF HOSPITALIZATION:  Chest pain with history of CAD.  The  patient's chest pain atypical on exam and is quite tender to palpation  over left chest  wall, specifically in intercostal region.  As mentioned  above, EKG obtained on admission within normal limits with no acute  signs of ischemia.  Repeat EKG done May 23, 2008, with same findings.  Cardiac enzymes were cycled x3 and were negative.  The patient's pain  improved with anti-inflammatory treatment overnight.  Chest x-ray done  during this admission was negative for any acute findings; it did reveal  COPD with no acute exacerbations on exam.  CT angiogram was obtained  which was negative for a pulmonary embolism.  The patient to be treated  with 10 days of anti-inflammatory treatment.  At time of dictation we  are awaiting call-back from the patient's cardiology office to schedule  very close follow-up as the patient may need longer treatment.   MEDICATIONS AT TIME OF DISCHARGE:  1. Indocin 25 mg p.o. t.i.d. x10 days.  2. Hydrochlorothiazide 12.5 mg p.o. daily.  3. Lisinopril 20 mg p.o. daily.  4. Metoprolol 25 mg p.o. daily.  5. Mavik 4 mg p.o. daily.  6. Travatan eye drops as  directed.   DISPOSITION:  The patient felt medically stable for discharge home at  this time.  At time of discharge an appointment with cardiologist is  attempted to be scheduled this week as the patient may require longer  than 10 days treatment of anti-inflammatory medication.  The patient  instructed to call the office or go to the ER for any increasing chest  pain associated with diaphoresis, nausea, vomiting, weakness or  shortness of breath.   Greater than 30 minutes spent on discharge planning.      Patrici Ranks, NP      Jannifer Rodney. Asa Lente, MD  Electronically Signed    LE/MEDQ  D:  05/23/2008  T:  05/23/2008  Job:  [id number]   cc:   Jeanella Craze. Little, M.D.  Fax: [phone]

## 2011-05-07 NOTE — H&P (Signed)
NAME:  Nicholas Porter, TALMAN NO.:  [id number]   MEDICAL RECORD NO.:  [id number]          PATIENT TYPE:  INP   LOCATION:  [id number]                         FACILITY:  [facility]   PHYSICIAN:  Leonel Ramsay, MD DATE OF BIRTH:  1930/08/05   DATE OF ADMISSION:  11/27/2008  DATE OF DISCHARGE:                              HISTORY & PHYSICAL   PRIMARY CARE PHYSICIAN:  Frankey Shown, MD with [place].  The  patient will be admitted to Dr. Asa Lente with [place].   CHIEF COMPLAINT:  Abdominal pain.   HISTORY OF PRESENT ILLNESS:  Mr. Nicholas Porter is a very pleasant 77 year old  white male with a history of coronary artery disease, hypertension,  GERD, carotid endarterectomy in 2007 who presents with 12 hours of the  left lower quadrant abdominal pain.  He says he was in his usual state  of health until came home from church and started to eat some ice cream.  After a few minutes, he got sharp left lower quadrant abdominal pain  that persisted for several hours.  He tried to take a nap but could not  sleep due to the pain and came to the emergency room.  He also had some  chills but no subjective fevers.  He did not have any nausea, vomiting  or diarrhea.  He had no bloody bowel movements.  He has had no recent  changes in his diet.  No sick contacts, and no changes in his bowel  habits over the last week.  In he emergency room he was noted to be  afebrile, but on CAT scan had an area of diverticulitis with some  stranding.  He is being admitted for IV antibiotics and rehydration.   PAST MEDICAL HISTORY:  1. Coronary artery disease with a history of catheterization in 2002      with 70% circumflex lesion and a normal stress test in November      2008.  2. GERD.  3. History of subtotal gastrectomy of approximately 40 years ago for      peptic ulcer.  4. Carotid artery stenosis status post left endarterectomy in 2007.  5. Hypertension.  6. Osteoarthritis.  7. Left total knee  replacement.  8. Amputation of left index finger.  9. Corneal lens implant.   SOCIAL HISTORY:  The patient lives with his wife.  He is retired from  [employer].  He denies tobacco, alcohol or drugs.   FAMILY HISTORY:  Significant for coronary artery disease and diabetes.   ALLERGIES:  NO KNOWN DRUG ALLERGIES.   MEDICATIONS:  Per the patient's own handwritten list:  1. Metoprolol ER 25 mg once a day.  2. Pamine 2.5 mg at bedtime.  3. Hydrochlorothiazide 25 mg once a day.  4. Lisinopril 20 mg once a day.  5. Vitamin C.  6. Travatan eye drops one drop in each eye q.h.s.   REVIEW OF SYSTEMS:  Eleven systems are reviewed and negative except as  per HPI.   PHYSICAL EXAMINATION:  VITAL SIGNS:  In the ED, the patient was afebrile  at 97.3, pulse of 71, blood pressure  147/63, respirations 16, saturating  99% on room air.  GENERAL:  He is an awake, alert white male in no acute distress.  Pleasant and interactive.  HEENT:  Pupils have post surgical changes.  Sclerae are injected but  anicteric.  NECK:  Supple.  No anterior cervical, posterior cervical or  supraclavicular lymphadenopathy.  HEART:  Regular rate and rhythm.  No murmurs, rubs or gallops.  LUNGS:  Clear to auscultation bilaterally.  ABDOMEN:  Soft and nondistended.  However, he is markedly tender to  palpation in the left lower quadrant.  Extremities:  No clubbing, cyanosis or edema.  NEUROLOGICAL:  He is alert and oriented x3, grossly nonfocal neuro exam.   LABORATORY DATA:  The patient had a white blood count on admission of  12.5, hemoglobin 14.4, platelets decreased at 102.  Sodium was 142,  potassium 4.4, chloride 111, glucose 118, BUN 37, creatinine 1.6, bicarb  22.  Urinalysis was negative.  LFTs were within normal limits.  Lipase  was 33.  The patient had a CAT scan of his abdomen which showed  gallstones but no gallbladder wall thickening.  Liver and spleen,  adrenals and pancrease were within normal limits,  common bile duct was  mildly elevated and there were some atrophic changes of the kidneys.  CT  of the pelvis showed diverticulosis of the sigmoid colon with stranding  adjacent to the sigmoid colon and slight wall thickening compatible with  acute diverticulitis.   IMPRESSION:  This is a 63-year gentleman admitted with acute  diverticulitis and renal insufficiency.  His CAT scan does show evidence  of diverticulitis.  He does have an elevated white count and abdominal  pain consistent with diverticulitis.  No evidence for urgent surgical  intervention needed.   PLAN:  1. Diverticulitis.  The patient will be made n.p.o.  He will be given      IV fluids to sustain him.  He will be placed on Cipro and Flagyl      and clinically followed up to ensure resolution.  2. Acute renal failure, creatinine is elevated at 1.6 and BUN is      elevated at 37.  His most recent other data available is from June      2009 at which point his creatinine, when he was seen in the ED, was      elevated at 1.9.  Prior to that in May 2009, he had a creatinine of      1.29.  The patient will be given IV fluids and creatinine rechecked      in the morning.  Will avoid nephrotoxins.  3. Hypertension.  The patient will be continued on his Lopressor in      order to prevent withdrawal and his blood pressures are stable.  We      will hold his lisinopril and hydrochlorothiazide for now given his      acute renal failure.  4. Thrombocytopenia.  The patient does have low platelets at this      point.  However, reviewing prior blood work they have been low in      June 2009 and were even low back in 2008.  The patient says he has      been checked by Dr. Joni Fears for this and it would be appropriate      to obtain some records to see what the workup was been.  5. Weight loss.  The patient reports he has been losing some weight,  and doctor says he is down from 160-140 pounds without obvious      etiology.  He  does say he had some difficulty swallowing and      hoarseness and was seen by ENT as an outpatient, but I have no      records available.  He should follow-up with Dr. Joni Fears after      discharge for further evaluation of his weight loss.  6. Prophylaxis.  The patient will be placed on subcu Lovenox as well      as a PPI.  At this point, the patient will be discharged home once      medically stable.      Leonel Ramsay, MD  Electronically Signed     DPF/MEDQ  D:  11/27/2008  T:  11/28/2008  Job:  [id number]

## 2011-05-10 NOTE — Op Note (Signed)
NAME:  [name], [name]              ACCOUNT NO.:  [id number]   MEDICAL RECORD NO.:  [id number]          PATIENT TYPE:  INP   LOCATION:  [id number]                         FACILITY:  [place]   PHYSICIAN:  Dorothea Glassman, M.D.    DATE OF BIRTH:  [birth year]   DATE OF PROCEDURE:  03/05/2006  DATE OF DISCHARGE:  03/06/2006                                 OPERATIVE REPORT   SURGEON:  Gordy Clement, MD   ASSISTANT:  John Giovanni, P.A.-C.   ANESTHESIA:  General endotracheal.   ANESTHESIOLOGIST:  Finis Bud, M.D.   PREOPERATIVE DIAGNOSIS:  Severe left internal carotid artery stenosis.   POSTOPERATIVE DIAGNOSIS:  Severe left internal carotid artery stenosis.   PROCEDURE:  Left carotid endarterectomy with Dacron patch angioplasty.   CLINICAL NOTE:  Nicholas Porter is a 77 year old male who was recently  referred to the CVTS office with history of visual loss in his left eye.  Workup for this revealed evidence of a severe left internal carotid artery  stenosis. It was recommended he undergo left carotid endarterectomy for  reduction of stroke risk. The patient consented for surgery.  He is brought  to the operating at this time for elective left carotid endarterectomy.   OPERATIVE PROCEDURE:  The patient was brought to the operating room in  stable condition. Placed in the supine position. General endotracheal  anesthesia induced. Foley catheter and arterial line were placed.  The left  neck was prepped and draped in sterile fashion.   A curvilinear skin incision made along the anterior border of the left  sternomastoid muscle. Dissection carried through subcutaneous tissue with  electrocautery.  Platysma divided. Deep dissection carried down to expose  the carotid bifurcation. The facial vein ligated with 2-0 silk and divided.  The carotid bifurcation was exposed. The common carotid artery mobilized  down to the omohyoid muscle  and encircled with a vessel loop. The vagus  nerve  reflected posteriorly and preserved. The origin of the superior  thyroid and external carotid were freed and encircled with vessel loops. The  internal carotid artery followed up to the posterior belly of the digastric  muscle and encircled with a vessel loop distally.   Evaluation of the carotid bifurcation verified calcified plaque disease in  the bulb extending into the origin of the left internal carotid artery. The  patient was administered 7000 units heparin intravenously. Adequate  circulation time was permitted. The carotid vessels were controlled clamps.  A longitudinal arteriotomy was made in the left common carotid artery. The  arteriotomy extended across carotid bulb and up into the internal carotid  artery. There was a high-grade left internal carotid artery stenosis present  with ulceration. A shunt was inserted.   The plaque was removed with an endarterectomy elevator. The endarterectomy  was carried down into the common carotid artery where the plaque was divided  transversely with Potts scissors. The plaque raised up into the bulb where  the superior thyroid and external carotid were endarterectomized using an  eversion technique. The distal internal carotid artery plaque then feathered  out well. Fragments of plaque  were removed with fine forceps. This area was  irrigated with heparin saline and dextran solutions.   A patch angioplasty of the endarterectomy site was then carried out with a  running 6-0 Prolene suture using of a Finesse Dacron patch. At the  completion of the patch angioplasty, the shunt was removed. All vessels were  well flushed. The clamps were removed directing the initial antegrade flow  up the internal carotid artery, following this internal carotid released.  There was an excellent pulse and Doppler signal in the distal internal  carotid artery.  Adequate hemostasis was obtained. Sponges and instrument  counts were correct. The patient was  administered 50 mg protamine  intravenously.   The sternomastoid fascia was closed with running 2-0 Vicryl suture, the  platysma was closed with running 3-0 Vicryl suture, the skin was closed with  4-0 Monocryl. Steri-Strips were applied. The patient transferred to the  recovery in stable condition. No apparent complications.      Dorothea Glassman, M.D.  Electronically Signed     PGH/MEDQ  D:  04/10/2006  T:  04/11/2006  Job:  [id number]   cc:   Shanna Cisco., M.D.  [address] [place]  [place] [address]

## 2011-05-10 NOTE — Cardiovascular Report (Signed)
[place]. [facility]  Patient:    Nicholas Porter, Nicholas Porter                     MRN: [id number] Proc. Date: 06/30/01 Adm. Date:  [id number] Attending:  Lawana Pai CC:         Hurshel Party, M.D.  Cardiac Catheterization Lab   Cardiac Catheterization  INDICATIONS FOR TEST:  Nicholas Porter is a 77 year old male who has had years of hypertension and six to eight years of recurrent chest pain.  A nuclear study was performed that showed anterior scarring but no significant ischemia. Because of persistent chest pain and the abnormality of scarring on his nuclear study in someone who had never a cardiac event, he was brought in for outpatient cardiac catheterization.  PROCEDURES: 1. Left heart catheterization. 2. Selective right and left coronary arteriography. 3. Ventriculography in the RAO projection.  CARDIOLOGIST:  Jeanella Craze. Little, M.D.  COMPLICATIONS:  None.  EQUIPMENT USED:  6-French Judkins configuration catheters.  DESCRIPTION OF PROCEDURE:  The patient was prepped and draped in the usual sterile fashion exposing the right groin, applying local anesthetic with 1% Xylocaine.  A Seldinger technique was employed and 6-French introducer sheath placed in the right femoral artery.  Selective right and left coronary arteriography and ventriculography in the RAO projection using 25 cc of contrast at 12 cc per second was performed.  RESULTS: I.   HEMODYNAMIC MONITORING:  Central aortic pressure 181/85, left ventricular      pressure 174/41.  There was no significant gradient noted at the time of      pullback.  Because of blood pressure elevation, the patient was given      20 mg of IV labetalol at the termination of the case. II.  VENTRICULOGRAPHY: Ventriculography was associated with marked ventricular      ectopy.  There was catheter-induced mitral regurgitation.  There was      normal left ventricular systolic function, but mitral valve prolapse was     seen.  There was left ventricular hypertrophy, and the end-diastolic      pressure was elevated at 36.  The ejection fraction was approximately      60%. III. CORONARY ARTERIOGRAPHY:      1. Left main: Normal.      2. LAD: The LAD was a short vessel.  It did not reach the apex of the         heart and gave rise to one large diagonal branch.  The LAD itself was         free of disease.  The first diagonal, however, had an area of 40%         irregularity in its mid portion      3. Circumflex: The Circumflex basically was one large OM vessel which         was free of disease.  The ongoing circumflex just after the OM was a         very small vessel that had an area of 70% narrowing.  This narrowing         occurred right after the takeoff of OM-1, and any type of intervention         in this vessel would jeopardize the large OM vessel.  In addition to         this, the vessel was very small, about 1.5 mm.      4. Right coronary artery:  The right  coronary artery was a very dominant         vessel, giving rise to a large PDA that wrapped around the apex of the         heart and two large posterolateral branches.  This system was free of         significant disease.  CONCLUSION: 1. Normal left ventricular systolic function. 3. Left ventricular hypertrophy. 3. Mitral valve prolapse. 4. Mild disease in the first diagonal and 70% stenosis in the ongoing    circumflex which is a 1.5 mm vessel and because of the location so close    to the ostium of obtuse marginal #1, would make it at high risk for    intervention.  I doubt that his chest pain is coming from the circumflex lesion.  If anything, it is coming from his left ventricular hypertrophy and his rather substantial hypertension.  The patient will be discharged to home today with followup in the office and change in medical therapy. DD:  06/30/01 TD:  06/30/01 Job: [id number]

## 2011-07-14 ENCOUNTER — Emergency Department (HOSPITAL_COMMUNITY)
Admission: EM | Admit: 2011-07-14 | Discharge: 2011-07-14 | Disposition: A | Discharge status: Home / Self Care | Payer: Medicare Other | Attending: Emergency Medicine | Admitting: Emergency Medicine

## 2011-07-14 DIAGNOSIS — Z79899 Other long term (current) drug therapy: Secondary | ICD-10-CM | POA: Insufficient documentation

## 2011-07-14 DIAGNOSIS — K219 Gastro-esophageal reflux disease without esophagitis: Secondary | ICD-10-CM | POA: Insufficient documentation

## 2011-07-14 DIAGNOSIS — M545 Low back pain, unspecified: Secondary | ICD-10-CM | POA: Insufficient documentation

## 2011-07-14 DIAGNOSIS — I10 Essential (primary) hypertension: Secondary | ICD-10-CM | POA: Insufficient documentation

## 2011-07-14 DIAGNOSIS — Z87891 Personal history of nicotine dependence: Secondary | ICD-10-CM | POA: Insufficient documentation

## 2011-09-18 LAB — BASIC METABOLIC PANEL
Calcium: 8.8
GFR calc non Af Amer: 54 — ABNORMAL LOW
Glucose, Bld: 104 — ABNORMAL HIGH
Sodium: 137

## 2011-09-18 LAB — CK TOTAL AND CKMB (NOT AT ARMC)
CK, MB: 2.3
Total CK: 85

## 2011-09-18 LAB — DIFFERENTIAL
Basophils Absolute: 0
Eosinophils Absolute: 0.1
Eosinophils Relative: 1
Lymphocytes Relative: 27
Neutrophils Relative %: 64

## 2011-09-18 LAB — CBC
Hemoglobin: 14.2
Platelets: 95 — ABNORMAL LOW
RDW: 13.3
WBC: 7.3

## 2011-09-18 LAB — TROPONIN I: Troponin I: 0.01

## 2011-09-19 LAB — URINALYSIS, ROUTINE W REFLEX MICROSCOPIC
Bilirubin Urine: NEGATIVE
Hgb urine dipstick: NEGATIVE
Nitrite: NEGATIVE
Specific Gravity, Urine: 1.022
pH: 5

## 2011-09-19 LAB — DIFFERENTIAL
Lymphocytes Relative: 15
Lymphs Abs: 1.9
Monocytes Absolute: 1.1 — ABNORMAL HIGH
Monocytes Relative: 9
Neutro Abs: 9.4 — ABNORMAL HIGH

## 2011-09-19 LAB — CBC
Hemoglobin: 16.2
RBC: 4.67

## 2011-09-19 LAB — URINE MICROSCOPIC-ADD ON

## 2011-09-19 LAB — POCT I-STAT, CHEM 8
Creatinine, Ser: 1.9 — ABNORMAL HIGH
Glucose, Bld: 117 — ABNORMAL HIGH
Hemoglobin: 15.6
Potassium: 3.9

## 2011-09-19 LAB — CARDIAC PANEL(CRET KIN+CKTOT+MB+TROPI)
CK, MB: 2.6
Total CK: 76

## 2011-09-19 LAB — POCT CARDIAC MARKERS: Myoglobin, poc: 184

## 2011-09-27 LAB — HEPATIC FUNCTION PANEL
ALT: 12 U/L (ref 0–53)
Albumin: 3.9 g/dL (ref 3.5–5.2)
Alkaline Phosphatase: 83 U/L (ref 39–117)
Total Protein: 6.8 g/dL (ref 6.0–8.3)

## 2011-09-27 LAB — URINE CULTURE: Colony Count: 3000

## 2011-09-27 LAB — COMPREHENSIVE METABOLIC PANEL
Albumin: 3.2 g/dL — ABNORMAL LOW (ref 3.5–5.2)
Alkaline Phosphatase: 115 U/L (ref 39–117)
BUN: 29 mg/dL — ABNORMAL HIGH (ref 6–23)
Chloride: 112 mEq/L (ref 96–112)
Creatinine, Ser: 1.26 mg/dL (ref 0.4–1.5)
GFR calc non Af Amer: 56 mL/min — ABNORMAL LOW (ref >60)
Glucose, Bld: 125 mg/dL — ABNORMAL HIGH (ref 70–99)
Total Bilirubin: 1.1 mg/dL (ref 0.3–1.2)

## 2011-09-27 LAB — CBC
HCT: 36.2 % — ABNORMAL LOW (ref 39.0–52.0)
HCT: 38 % — ABNORMAL LOW (ref 39.0–52.0)
Hemoglobin: 12.5 g/dL — ABNORMAL LOW (ref 13.0–17.0)
Hemoglobin: 13.1 g/dL (ref 13.0–17.0)
MCHC: 34.7 g/dL (ref 30.0–36.0)
MCV: 99 fL (ref 78.0–100.0)
MCV: 99.2 fL (ref 78.0–100.0)
Platelets: 102 10*3/uL — ABNORMAL LOW (ref 150–400)
Platelets: 73 10*3/uL — ABNORMAL LOW (ref 150–400)
Platelets: 79 10*3/uL — ABNORMAL LOW (ref 150–400)
RBC: 3.65 MIL/uL — ABNORMAL LOW (ref 4.22–5.81)
RDW: 13 % (ref 11.5–15.5)
RDW: 13.1 % (ref 11.5–15.5)
WBC: 12.5 10*3/uL — ABNORMAL HIGH (ref 4.0–10.5)
WBC: 8.2 10*3/uL (ref 4.0–10.5)
WBC: 8.8 10*3/uL (ref 4.0–10.5)

## 2011-09-27 LAB — BASIC METABOLIC PANEL
Calcium: 8.6 mg/dL (ref 8.4–10.5)
GFR calc Af Amer: >60 mL/min (ref >60)
GFR calc non Af Amer: 51 mL/min — ABNORMAL LOW (ref >60)
Glucose, Bld: 103 mg/dL — ABNORMAL HIGH (ref 70–99)
Potassium: 4.6 mEq/L (ref 3.5–5.1)
Sodium: 140 mEq/L (ref 135–145)

## 2011-09-27 LAB — POCT I-STAT, CHEM 8
BUN: 37 mg/dL — ABNORMAL HIGH (ref 6–23)
Calcium, Ion: 1.27 mmol/L (ref 1.12–1.32)
Chloride: 111 mEq/L (ref 96–112)
Glucose, Bld: 118 mg/dL — ABNORMAL HIGH (ref 70–99)
Potassium: 4.4 mEq/L (ref 3.5–5.1)

## 2011-09-27 LAB — URINALYSIS, ROUTINE W REFLEX MICROSCOPIC
Bilirubin Urine: NEGATIVE
Glucose, UA: NEGATIVE mg/dL
Hgb urine dipstick: NEGATIVE
Specific Gravity, Urine: 1.018 (ref 1.005–1.030)
Urobilinogen, UA: 0.2 mg/dL (ref 0.0–1.0)
pH: 5.5 (ref 5.0–8.0)

## 2011-09-27 LAB — DIFFERENTIAL
Basophils Absolute: 0.1 10*3/uL (ref 0.0–0.1)
Lymphocytes Relative: 15 % (ref 12–46)
Lymphs Abs: 1.9 10*3/uL (ref 0.7–4.0)
Neutro Abs: 9.3 10*3/uL — ABNORMAL HIGH (ref 1.7–7.7)
Neutrophils Relative %: 74 % (ref 43–77)

## 2011-10-09 LAB — DIFFERENTIAL
Basophils Relative: 0
Lymphocytes Relative: 33
Lymphs Abs: 2.4
Monocytes Absolute: 0.6
Monocytes Relative: 9
Neutro Abs: 4.1

## 2011-10-09 LAB — BASIC METABOLIC PANEL
Calcium: 9
Creatinine, Ser: 1.47
GFR calc Af Amer: 57 — ABNORMAL LOW

## 2011-10-09 LAB — CBC
MCHC: 34.8
RBC: 4.16 — ABNORMAL LOW
WBC: 7.3

## 2011-10-09 LAB — APTT: aPTT: 28

## 2011-10-10 ENCOUNTER — Emergency Department (HOSPITAL_COMMUNITY)
Admission: EM | Admit: 2011-10-10 | Discharge: 2011-10-10 | Disposition: A | Discharge status: Home / Self Care | Payer: Medicare Other | Attending: Emergency Medicine | Admitting: Emergency Medicine

## 2011-10-10 DIAGNOSIS — I10 Essential (primary) hypertension: Secondary | ICD-10-CM | POA: Insufficient documentation

## 2011-10-10 DIAGNOSIS — S335XXA Sprain of ligaments of lumbar spine, initial encounter: Secondary | ICD-10-CM | POA: Insufficient documentation

## 2011-10-10 DIAGNOSIS — X58XXXA Exposure to other specified factors, initial encounter: Secondary | ICD-10-CM | POA: Insufficient documentation

## 2011-10-10 DIAGNOSIS — M545 Low back pain, unspecified: Secondary | ICD-10-CM | POA: Insufficient documentation

## 2011-10-10 DIAGNOSIS — M129 Arthropathy, unspecified: Secondary | ICD-10-CM | POA: Insufficient documentation

## 2011-10-10 DIAGNOSIS — M62838 Other muscle spasm: Secondary | ICD-10-CM | POA: Insufficient documentation

## 2011-10-10 DIAGNOSIS — Y93H9 Activity, other involving exterior property and land maintenance, building and construction: Secondary | ICD-10-CM | POA: Insufficient documentation

## 2011-10-25 ENCOUNTER — Telehealth: Payer: Self-pay | Admitting: Family Medicine

## 2011-10-25 NOTE — Telephone Encounter (Signed)
Opened in error

## 2011-11-27 ENCOUNTER — Other Ambulatory Visit: Payer: Self-pay

## 2011-11-27 MED ORDER — LISINOPRIL 20 MG PO TABS: 20.0000 mg | ORAL_TABLET | Freq: Every day | ORAL | Status: DC

## 2011-11-27 MED ORDER — SIMVASTATIN 20 MG PO TABS: 20.0000 mg | ORAL_TABLET | Freq: Every day | ORAL | Status: DC

## 2011-11-27 MED ORDER — METOPROLOL SUCCINATE ER 25 MG PO TB24: 25.0000 mg | ORAL_TABLET | Freq: Every day | ORAL | Status: DC

## 2011-11-27 NOTE — Telephone Encounter (Signed)
The patient called to request a refill on medications as will be out by the weekend. The patient is scheduled as a new patient on January 03, 2012. Informed we would take care of filling. Also he will have the pharmacy notify as his wife Riccardo Hendren) needs refills as well and scheduled on the same date.

## 2011-12-04 ENCOUNTER — Emergency Department (HOSPITAL_COMMUNITY): Payer: Medicare Other

## 2011-12-04 ENCOUNTER — Encounter (HOSPITAL_COMMUNITY): Payer: Self-pay | Admitting: Adult Health

## 2011-12-04 ENCOUNTER — Emergency Department (HOSPITAL_COMMUNITY)
Admission: EM | Admit: 2011-12-04 | Discharge: 2011-12-04 | Disposition: A | Discharge status: Home / Self Care | Payer: Medicare Other | Attending: Emergency Medicine | Admitting: Emergency Medicine

## 2011-12-04 DIAGNOSIS — E785 Hyperlipidemia, unspecified: Secondary | ICD-10-CM | POA: Insufficient documentation

## 2011-12-04 DIAGNOSIS — Z96659 Presence of unspecified artificial knee joint: Secondary | ICD-10-CM | POA: Insufficient documentation

## 2011-12-04 DIAGNOSIS — I10 Essential (primary) hypertension: Secondary | ICD-10-CM | POA: Insufficient documentation

## 2011-12-04 DIAGNOSIS — M1990 Unspecified osteoarthritis, unspecified site: Secondary | ICD-10-CM | POA: Insufficient documentation

## 2011-12-04 DIAGNOSIS — M545 Low back pain, unspecified: Secondary | ICD-10-CM | POA: Insufficient documentation

## 2011-12-04 DIAGNOSIS — E039 Hypothyroidism, unspecified: Secondary | ICD-10-CM | POA: Insufficient documentation

## 2011-12-04 DIAGNOSIS — T148XXA Other injury of unspecified body region, initial encounter: Secondary | ICD-10-CM

## 2011-12-04 DIAGNOSIS — M542 Cervicalgia: Secondary | ICD-10-CM | POA: Insufficient documentation

## 2011-12-04 DIAGNOSIS — G8929 Other chronic pain: Secondary | ICD-10-CM | POA: Insufficient documentation

## 2011-12-04 DIAGNOSIS — I2510 Atherosclerotic heart disease of native coronary artery without angina pectoris: Secondary | ICD-10-CM | POA: Insufficient documentation

## 2011-12-04 MED ORDER — TRAMADOL HCL 50 MG PO TABS
25.0000 mg | ORAL_TABLET | Freq: Once | ORAL | Status: AC
  Administered 2011-12-04: 25 mg via ORAL
  Filled 2011-12-04: qty 1

## 2011-12-04 MED ORDER — TRAMADOL HCL 50 MG PO TABS: ORAL_TABLET | ORAL | Status: DC

## 2011-12-04 MED ORDER — DIAZEPAM 2 MG PO TABS
2.0000 mg | ORAL_TABLET | Freq: Once | ORAL | Status: AC
  Administered 2011-12-04: 2 mg via ORAL
  Filled 2011-12-04: qty 1

## 2011-12-04 NOTE — ED Notes (Addendum)
error 

## 2011-12-04 NOTE — ED Notes (Signed)
Patient transported to X-ray 

## 2011-12-04 NOTE — ED Provider Notes (Signed)
History     CSN: [id number] Arrival date & time: 12/04/2011 12:02 PM   Chief Complaint  Patient presents with  . Back Pain  . Neck Pain   HPI Pt was seen at 1530.  Per pt and his spouse, c/o gradual onset and persistence of constant acute flair of his chronic neck and low back "pain" that began after he was "digging up and moving a bunch of trees" 2 days ago.  Has not been taking any of his home meds for pain.  Pain worsens with palpation of the areas and movement of his torso.  Denies direct injury to areas, no focal motor weakness, no tingling/numbness in extremities, no CP/SOB, no abd pain, no N/V/D, no fevers, no saddle anesthesia, no incont/retention of bowel or bladder.      [facility], Dr. Shellia Carwin Past Medical History  Diagnosis Date  . ANEMIA, B12 DEFICIENCY 11/04/2008  . ARTHRITIS, HX OF 06/30/2007  . BACK PAIN, LUMBAR 03/07/2009  . BACK PAIN, UPPER 12/05/2010  . 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 COLONIC POLYPS, HX OF 08/25/2008  . CORONARY ARTERY DISEASE 05/22/2008  . DIVERTICULITIS, COLON 12/08/2008  . GERD 06/30/2007  . HIATAL HERNIA 08/25/2008  . HYPERKERATOSIS 10/19/2009  . HYPERLIPIDEMIA 10/18/2008  . HYPERTENSION 06/30/2007  . HYPOTHYROIDISM 10/18/2008  . Irritable bowel syndrome 12/08/2008  . OSTEOARTHRITIS 05/22/2008  . OSTEOPENIA 10/18/2008  . PEPTIC ULCER DISEASE, HX OF 06/30/2007  . RENAL FAILURE 12/06/2009  . THROMBOCYTOPENIA NOS 06/30/2007  . UNS ADVRS EFF OTH RX MEDICINAL&BIOLOGICAL SBSTNC 10/18/2008  . VITAMIN B12 DEFICIENCY 09/02/2008  . Carotid artery disease     Past Surgical History  Procedure Date  . Foot surgery     left  . Carpal tunnel release   . Hernia repair   . Total knee arthroplasty   . Carotid endarterectomy   . Amputation     left index finger -tramatic  . Vagotomy     partial gastrectomy  . Partial gastrectomy   . Corneal transplant   . Rotator cuff repair     left   History  Substance Use Topics  . Smoking  status: Former Smoker    Quit date: 12/23/1964  . Smokeless tobacco: Never Used  . Alcohol Use: No    Review of Systems ROS: Statement: All systems negative except as marked or noted in the HPI; Constitutional: Negative for fever and chills. ; ; Eyes: Negative for eye pain, redness and discharge. ; ; ENMT: Negative for ear pain, hoarseness, nasal congestion, sinus pressure and sore throat. ; ; Cardiovascular: Negative for chest pain, palpitations, diaphoresis, dyspnea and peripheral edema. ; ; Respiratory: Negative for cough, wheezing and stridor. ; ; Gastrointestinal: Negative for nausea, vomiting, diarrhea, abdominal pain, blood in stool, hematemesis, jaundice and rectal bleeding. . ; ; Genitourinary: Negative for dysuria, flank pain and hematuria. ; ; Musculoskeletal: +low back pain and neck pain. Negative for swelling and trauma.; ; Skin: Negative for pruritus, rash, abrasions, blisters, bruising and skin lesion.; ; Neuro: Negative for headache, lightheadedness and neck stiffness. Negative for weakness, altered level of consciousness , altered mental status, extremity weakness, paresthesias, involuntary movement, seizure and syncope.     Allergies  Amlodipine besylate; Naproxen; and Oxycodone-acetaminophen  Home Medications   Current Outpatient Rx  Name Route Sig Dispense Refill  . ASPIRIN EC 81 MG PO TBEC Oral Take 81 mg by mouth every other day.      Marland Kitchen BRINZOLAMIDE 1 % OP SUSP Left Eye Place 1  drop into the left eye 2 (two) times daily.      Marland Kitchen LISINOPRIL 20 MG PO TABS Oral Take 1 tablet (20 mg total) by mouth daily. 30 tablet 1  . METHOCARBAMOL 500 MG PO TABS Oral Take 500 mg by mouth every 6 (six) hours as needed. For spasms     . METOPROLOL SUCCINATE ER 25 MG PO TB24 Oral Take 1 tablet (25 mg total) by mouth daily. 30 tablet 1  . SIMVASTATIN 20 MG PO TABS Oral Take 1 tablet (20 mg total) by mouth daily. 30 tablet 1  . TRAVOPROST 0.004 % OP SOLN Both Eyes Place 1 drop into both eyes at  bedtime.      Marland Kitchen NITROGLYCERIN 0.4 MG SL SUBL Sublingual Place 0.4 mg under the tongue every 5 (five) minutes as needed. For chest pain.      BP 142/69  Pulse 64  Temp(Src) 97.3 F (36.3 C) (Oral)  Resp 16  SpO2 99%  Physical Exam 1635: Physical examination:  Nursing notes reviewed; Vital signs and O2 SAT reviewed;  Constitutional: Well developed, Well nourished, Well hydrated, In no acute distress; Head:  Normocephalic, atraumatic; Eyes: EOMI, PERRL, No scleral icterus; ENMT: Mouth and pharynx normal, Mucous membranes moist; Neck: Supple, Full range of motion, No lymphadenopathy; Cardiovascular: Regular rate and rhythm, No murmur, rub, or gallop; Respiratory: Breath sounds clear & equal bilaterally, No rales, rhonchi, wheezes, or rub, Normal respiratory effort/excursion; Chest: Nontender, Movement normal; Abdomen: Soft, Nontender, Nondistended, Normal bowel sounds; Genitourinary: No CVA tenderness; Spine:  No midline CS, TS, LS tenderness.  +TTP left cervical paraspinal, right lumbar paraspinal muscles, and right SI joint areas to palp which reproduces pt's pain. Extremities: Pulses normal, No tenderness, No deformity.  Pelvis stable.  No edema, No calf edema or asymmetry.; Neuro: AA&Ox3, Major CN grossly intact. Strength 5/5 equal bilat UE's and LE's, no drift x4 ext.  DTR 2/4 equal bilat UE's and LE's.  No gross sensory deficits.  Normal cerebellar testing bilat UE's and LE's. Speech clear.  No facial droop. No gross focal motor or sensory deficits in extremities.; Skin: Color normal, Warm, Dry, no rash.      ED Course  Procedures   MDM  MDM Reviewed: nursing note, vitals and previous chart Interpretation: x-ray    Dg Thoracic Spine 2 View  12/04/2011  *RADIOLOGY REPORT*  Clinical Data: Severe low right back pain.  No recent injury.  THORACIC SPINE - 2 VIEW  Comparison: Chest radiographs 11/19/2010.  Findings: There is conventional anatomy with 12 rib-bearing thoracic type vertebral  bodies.  The alignment is normal.  There is no evidence of fracture or paraspinal hematoma.  There is no widening of the interpedicular distance.  Multilevel paraspinal osteophytes are noted without significant change.  IMPRESSION: Stable diffuse thoracic spondylosis.  No acute osseous findings.  Original Report Authenticated By: Vivia Ewing, M.D.   Dg Lumbar Spine Complete  12/04/2011  *RADIOLOGY REPORT*  Clinical Data: Low back and right leg pain without trauma.  LUMBAR SPINE - COMPLETE 4+ VIEW  Comparison: CT of 11/27/2008.  Findings: Five lumbar type vertebral bodies.  Sacroiliac joints are symmetric.  Mild osteopenia.  Surgical clips at the level of the gastroesophageal junction.  Minimal convex right lumbar spine curvature.  Severe degenerative disc disease at L5-S1.  Facet arthropathy at this level.  Trace L4-L5 anterolisthesis and L3-L4 retrolisthesis,  likely degenerative.  More moderate spondylosis at L1-L4.  Aortic atherosclerosis.  IMPRESSION:  1.  Lower lumbar spondylosis  with mild malalignment, likely secondary. 2. No acute osseous abnormality.  Original Report Authenticated By: Areta Haber, M.D.   Ct Cervical Spine Wo Contrast  12/04/2011  *RADIOLOGY REPORT*  Clinical Data: Chronic neck pain  CT CERVICAL SPINE WITHOUT CONTRAST  Technique:  Multidetector CT imaging of the cervical spine was performed. Multiplanar CT image reconstructions were also generated.  Comparison: Radiography 06/14  Findings: The foramen magnum is widely patent.  Atherosclerotic change effects major vessels.  C1-2 shows ordinary degenerative disease between the anterior arch and the dens.  No encroachment upon the neural spaces.  C2-3 shows a small uncovertebral osteophytes.  There is mild facet degeneration bilaterally.  No significant narrowing of the canal or foramina.  C3-4 shows degenerative spondylosis with endplate osteophytes but no central canal narrowing.  There is mild facet degeneration. There is  osteophytic encroachment upon the neural foramina right more than left.  In particular, the right C4 nerve root could be affected.  C4-5:  Spondylosis with end plate and uncovertebral osteophytes. No facet degeneration.  There is moderate narrowing the central canal but no definite cord deformity.  There is severe stenosis of both neural foramina.  C5-6:  Solid fusion.  The canal is mildly narrowed because of bony encroachment.  There is foraminal stenosis right worse than left because of bony encroachment.  C6-7:  Spondylosis.  There is been previous surgery on the left posteriorly.  The central canal sufficiently patent.  There is osteophytic narrowing of the foramina right more than left.  C7-T1:  There is bilateral facet arthropathy right worse than left with anterolisthesis of 3 mm.  The central canal appears sufficiently patent.  Foraminal stenosis right more than left could effect either C8 nerve root.  T1-2:  Disc degeneration with osteophytic encroachment upon the foramina.  IMPRESSION: Extensive chronic degenerative disease as outlined above.  Central canal stenosis is most pronounced at C4-5.  Foraminal stenosis is present at multiple levels which could effect multiple cervical nerve roots.  The findings in total could certainly relate to neck pain.  Original Report Authenticated By: Jules Schick, M.D.     8:33 PM:  Pt has gotten himself dressed and is walking around ED.  VSS, gait steady, resps easy.  Wants to go home now.  Dx testing d/w pt and family.  Questions answered.  Verb understanding, agreeable to d/c home with outpt f/u.      [place], DO 12/06/11 1150

## 2011-12-04 NOTE — ED Notes (Signed)
C/o right flank pain and right lower back pain that is worse with movement. Pt is unable to tolerate sitting for long periods of time. C/o left sided neck pain that began this am, pain is worse with movement.

## 2011-12-06 ENCOUNTER — Encounter: Payer: Self-pay | Admitting: Internal Medicine

## 2011-12-06 ENCOUNTER — Other Ambulatory Visit (INDEPENDENT_AMBULATORY_CARE_PROVIDER_SITE_OTHER): Payer: Medicare Other

## 2011-12-06 ENCOUNTER — Ambulatory Visit (INDEPENDENT_AMBULATORY_CARE_PROVIDER_SITE_OTHER): Payer: Medicare Other | Admitting: Internal Medicine

## 2011-12-06 VITALS — BP 120/72 | HR 62 | Temp 97.9°F | Ht 67.0 in | Wt 143.1 lb

## 2011-12-06 DIAGNOSIS — E785 Hyperlipidemia, unspecified: Secondary | ICD-10-CM

## 2011-12-06 DIAGNOSIS — R5383 Other fatigue: Secondary | ICD-10-CM

## 2011-12-06 DIAGNOSIS — N189 Chronic kidney disease, unspecified: Secondary | ICD-10-CM

## 2011-12-06 DIAGNOSIS — J4489 Other specified chronic obstructive pulmonary disease: Secondary | ICD-10-CM

## 2011-12-06 DIAGNOSIS — R5381 Other malaise: Secondary | ICD-10-CM

## 2011-12-06 DIAGNOSIS — J449 Chronic obstructive pulmonary disease, unspecified: Secondary | ICD-10-CM

## 2011-12-06 LAB — URINALYSIS, ROUTINE W REFLEX MICROSCOPIC
Bilirubin Urine: NEGATIVE
Hgb urine dipstick: NEGATIVE
Nitrite: NEGATIVE
Urine Glucose: NEGATIVE
Urobilinogen, UA: 0.2 (ref 0.0–1.0)

## 2011-12-06 LAB — CBC WITH DIFFERENTIAL/PLATELET
Basophils Absolute: 0 10*3/uL (ref 0.0–0.1)
Eosinophils Absolute: 0.1 10*3/uL (ref 0.0–0.7)
Lymphocytes Relative: 31.9 % (ref 12.0–46.0)
MCHC: 35.1 g/dL (ref 30.0–36.0)
MCV: 98.4 fl (ref 78.0–100.0)
Monocytes Absolute: 1.2 10*3/uL — ABNORMAL HIGH (ref 0.1–1.0)
Neutrophils Relative %: 52.1 % (ref 43.0–77.0)
RDW: 13.9 % (ref 11.5–14.6)

## 2011-12-06 LAB — HEPATIC FUNCTION PANEL
ALT: 15 U/L (ref 0–53)
AST: 18 U/L (ref 0–37)
Alkaline Phosphatase: 89 U/L (ref 39–117)
Bilirubin, Direct: 0.2 mg/dL (ref 0.0–0.3)
Total Protein: 7.1 g/dL (ref 6.0–8.3)

## 2011-12-06 LAB — LIPID PANEL
Cholesterol: 110 mg/dL (ref 0–200)
HDL: 46.7 mg/dL (ref >39.00)
LDL Cholesterol: 48 mg/dL (ref 0–99)
VLDL: 15.8 mg/dL (ref 0.0–40.0)

## 2011-12-06 LAB — BASIC METABOLIC PANEL
CO2: 21 mEq/L (ref 19–32)
Chloride: 114 mEq/L — ABNORMAL HIGH (ref 96–112)
Potassium: 5.7 mEq/L — ABNORMAL HIGH (ref 3.5–5.1)
Sodium: 142 mEq/L (ref 135–145)

## 2011-12-06 NOTE — Patient Instructions (Addendum)
Continue all other medications as before Please go to LAB in the Basement for the blood and/or urine tests to be done today Please call the phone number [phone] (the [place]) for results of testing in 2-3 days;  When calling, simply dial the number, and when prompted enter the MRN number above (the Medical Record Number) and the # key, then the message should start. Please keep your appointments with your specialists as you have planned - Dr Melvern Banker, and Dr Collier Salina Please return in 6 months, or sooner if needed   OK to cancel the Jan 2013 appt with me

## 2011-12-07 ENCOUNTER — Encounter: Payer: Self-pay | Admitting: Internal Medicine

## 2011-12-07 NOTE — Assessment & Plan Note (Signed)
stable overall by hx and exam, most recent data reviewed with pt, and pt to continue medical treatment as before  Lab Results  Component Value Date   LDLCALC 48 12/06/2011   Goal ldl < 70

## 2011-12-07 NOTE — Assessment & Plan Note (Signed)
Etiology unclear, Exam otherwise benign, to check labs as documented, follow with expectant management  

## 2011-12-07 NOTE — Progress Notes (Signed)
Subjective:    Patient ID: Nicholas Porter, male    DOB: 06-23-1930, 77 y.o.   MRN: [id number]  HPI  Here as new pt to me, early appt due to recent ER eval for acute pain now resolved.  Pt continues to have recurring chronic neck and LBP without change in severity, bowel or bladder change, fever, wt loss,  worsening LE pain/numbness/weakness, gait change or falls. Pt denies new neurological symptoms such as new headache, or facial or extremity weakness or numbness   Pt denies polydipsia, polyuria.  Pt states overall good compliance with meds, trying to follow lower cholesterol, diabetic diet, wt overall stable, and relatively active in terms of exercise for his age and spine issues.  Does ask for handicap form filled out today.    Does have sense of ongoing fatigue, but denies signficant hypersomnolence.   Pt denies fever, wt loss, night sweats, loss of appetite, or other constitutional symptoms Past Medical History  Diagnosis Date  . ANEMIA, B12 DEFICIENCY 11/04/2008  . ARTHRITIS, HX OF 06/30/2007  . BACK PAIN, LUMBAR 03/07/2009  . BACK PAIN, UPPER 12/05/2010  . 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 COLONIC POLYPS, HX OF 08/25/2008  . CORONARY ARTERY DISEASE 05/22/2008  . DIVERTICULITIS, COLON 12/08/2008  . GERD 06/30/2007  . HIATAL HERNIA 08/25/2008  . HYPERKERATOSIS 10/19/2009  . HYPERLIPIDEMIA 10/18/2008  . HYPERTENSION 06/30/2007  . HYPOTHYROIDISM 10/18/2008  . Irritable bowel syndrome 12/08/2008  . OSTEOARTHRITIS 05/22/2008  . OSTEOPENIA 10/18/2008  . PEPTIC ULCER DISEASE, HX OF 06/30/2007  . RENAL FAILURE 12/06/2009  . THROMBOCYTOPENIA NOS 06/30/2007  . UNS ADVRS EFF OTH RX MEDICINAL&BIOLOGICAL SBSTNC 10/18/2008  . VITAMIN B12 DEFICIENCY 09/02/2008  . Carotid artery disease   . Neck pain, chronic   . CKD (chronic kidney disease) 12/06/2011  . 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 Gallstones   . COPD (chronic obstructive pulmonary disease) 12/07/2011  . Carotid stenosis 12/07/2011   Past  Surgical History  Procedure Date  . Foot surgery     left  . Carpal tunnel release   . Hernia repair   . Total knee arthroplasty   . Carotid endarterectomy   . Amputation     left index finger -tramatic  . Vagotomy     partial gastrectomy  . Partial gastrectomy   . Corneal transplant   . Rotator cuff repair     left  . Spine surgery 1985    cervical laminectomy    reports that he quit smoking about 46 years ago. He has never used smokeless tobacco. He reports that he does not drink alcohol or use illicit drugs. family history is not on file. Allergies  Allergen Reactions  . Amlodipine Besylate Hives  . Naproxen Diarrhea  . Oxycodone-Acetaminophen Other (See Comments)    REACTION: unspecified   Current Outpatient Prescriptions on File Prior to Visit  Medication Sig Dispense Refill  . aspirin EC 81 MG tablet Take 81 mg by mouth every other day.        . brinzolamide (AZOPT) 1 % ophthalmic suspension Place 1 drop into the left eye 2 (two) times daily.        Marland Kitchen lisinopril (PRINIVIL,ZESTRIL) 20 MG tablet Take 1 tablet (20 mg total) by mouth daily.  30 tablet  1  . methocarbamol (ROBAXIN) 500 MG tablet Take 500 mg by mouth every 6 (six) hours as needed. For spasms       . metoprolol succinate (TOPROL-XL) 25 MG 24 hr tablet Take 1 tablet (  25 mg total) by mouth daily.  30 tablet  1  . nitroGLYCERIN (NITROSTAT) 0.4 MG SL tablet Place 0.4 mg under the tongue every 5 (five) minutes as needed. For chest pain.      . simvastatin (ZOCOR) 20 MG tablet Take 1 tablet (20 mg total) by mouth daily.  30 tablet  1  . traMADol (ULTRAM) 50 MG tablet ONE HALF to 1 tablet PO q6h prn pain.  Maximum dose= 8 tablets per day  10 tablet  0  . travoprost, benzalkonium, (TRAVATAN) 0.004 % ophthalmic solution Place 1 drop into both eyes at bedtime.         Review of Systems Review of Systems  Constitutional: Negative for diaphoresis and unexpected weight change.  HENT: Negative for drooling and tinnitus.     Eyes: Negative for photophobia and visual disturbance.  Respiratory: Negative for choking and stridor.   Gastrointestinal: Negative for vomiting and blood in stool.  Genitourinary: Negative for hematuria and decreased urine volume.       Objective:   Physical Exam BP 120/72  Pulse 62  Temp(Src) 97.9 F (36.6 C) (Oral)  Ht 5\' 7"  (1.702 m)  Wt 143 lb 2 oz (64.921 kg)  BMI 22.42 kg/m2  SpO2 97% Physical Exam  VS noted Constitutional: Pt appears well-developed and well-nourished.  HENT: Head: Normocephalic.  Right Ear: External ear normal.  Left Ear: External ear normal.  Eyes: Conjunctivae and EOM are normal. Pupils are equal, round, and reactive to light.  Neck: Normal range of motion. Neck supple.  Cardiovascular: Normal rate and regular rhythm.   Pulmonary/Chest: Effort normal and breath sounds normal.  Abd:  Soft, NT, non-distended, + BS Neurological: Pt is alert. No cranial nerve deficit.  Skin: Skin is warm. No erythema.  Psychiatric: Pt behavior is normal. Thought content normal.     Assessment & Plan:

## 2011-12-07 NOTE — Assessment & Plan Note (Signed)
stable overall by hx and exam, most recent data reviewed with pt, and pt to continue medical treatment as before SpO2 Readings from Last 3 Encounters:  12/06/11 97%  12/04/11 99%  05/10/10 98%

## 2011-12-07 NOTE — Assessment & Plan Note (Signed)
stable overall by hx and exam, most recent data reviewed with pt, and pt to continue medical treatment as before Lab Results  Component Value Date   CREATININE 1.8* 12/06/2011

## 2011-12-09 ENCOUNTER — Telehealth: Payer: Self-pay

## 2011-12-09 NOTE — Telephone Encounter (Signed)
Put order in for the lab

## 2011-12-10 ENCOUNTER — Other Ambulatory Visit (INDEPENDENT_AMBULATORY_CARE_PROVIDER_SITE_OTHER): Payer: Medicare Other

## 2011-12-10 ENCOUNTER — Other Ambulatory Visit: Payer: Self-pay | Admitting: Internal Medicine

## 2011-12-10 DIAGNOSIS — R7309 Other abnormal glucose: Secondary | ICD-10-CM

## 2011-12-10 LAB — BASIC METABOLIC PANEL
BUN: 56 mg/dL — ABNORMAL HIGH (ref 6–23)
Calcium: 9.4 mg/dL (ref 8.4–10.5)
Creatinine, Ser: 1.9 mg/dL — ABNORMAL HIGH (ref 0.4–1.5)
GFR: 37.58 mL/min — ABNORMAL LOW (ref >60.00)
Glucose, Bld: 114 mg/dL — ABNORMAL HIGH (ref 70–99)
Potassium: 5.7 mEq/L — ABNORMAL HIGH (ref 3.5–5.1)

## 2011-12-10 LAB — HEMOGLOBIN A1C: Hgb A1c MFr Bld: 5.2 % (ref 4.6–6.5)

## 2011-12-10 MED ORDER — METOPROLOL SUCCINATE ER 50 MG PO TB24: 50.0000 mg | ORAL_TABLET | Freq: Every day | ORAL | Status: DC

## 2011-12-20 ENCOUNTER — Other Ambulatory Visit (INDEPENDENT_AMBULATORY_CARE_PROVIDER_SITE_OTHER): Payer: Medicare Other

## 2011-12-20 ENCOUNTER — Encounter: Payer: Self-pay | Admitting: Internal Medicine

## 2011-12-20 ENCOUNTER — Ambulatory Visit (INDEPENDENT_AMBULATORY_CARE_PROVIDER_SITE_OTHER): Payer: Medicare Other | Admitting: Internal Medicine

## 2011-12-20 VITALS — BP 124/68 | HR 64 | Temp 97.5°F | Ht 67.0 in | Wt 144.6 lb

## 2011-12-20 DIAGNOSIS — E875 Hyperkalemia: Secondary | ICD-10-CM

## 2011-12-20 DIAGNOSIS — E039 Hypothyroidism, unspecified: Secondary | ICD-10-CM

## 2011-12-20 DIAGNOSIS — E785 Hyperlipidemia, unspecified: Secondary | ICD-10-CM

## 2011-12-20 DIAGNOSIS — Z0000 Encounter for general adult medical examination without abnormal findings: Secondary | ICD-10-CM

## 2011-12-20 DIAGNOSIS — I10 Essential (primary) hypertension: Secondary | ICD-10-CM

## 2011-12-20 LAB — BASIC METABOLIC PANEL
GFR: 46.09 mL/min — ABNORMAL LOW (ref >60.00)
Potassium: 5.5 mEq/L — ABNORMAL HIGH (ref 3.5–5.1)
Sodium: 140 mEq/L (ref 135–145)

## 2011-12-20 NOTE — Patient Instructions (Signed)
Continue all other medications as before Please go to LAB in the Basement for the blood and/or urine tests to be done today Please call the phone number [phone] (the [place]) for results of testing in 2-3 days;  When calling, simply dial the number, and when prompted enter the MRN number above (the Medical Record Number) and the # key, then the message should start.

## 2011-12-22 ENCOUNTER — Encounter: Payer: Self-pay | Admitting: Internal Medicine

## 2011-12-22 NOTE — Progress Notes (Signed)
Subjective:    Patient ID: Nicholas Porter, male    DOB: 10/16/1930, 77 y.o.   MRN: [id number]  HPI  Here to f/u; overall doing ok, did stop the ACE, here for BP check and K repeat; Pt denies chest pain, increased sob or doe, wheezing, orthopnea, PND, increased LE swelling, palpitations, dizziness or syncope.  Pt denies new neurological symptoms such as new headache, or facial or extremity weakness or numbness   Pt denies polydipsia, polyuria.  No leg cramps or fatigue.  Pt denies fever, wt loss, night sweats, loss of appetite, or other constitutional symptoms. Denies hyper or hypo thyroid symptoms such as voice, skin or hair change.  Past Medical History  Diagnosis Date  . ANEMIA, B12 DEFICIENCY 11/04/2008  . ARTHRITIS, HX OF 06/30/2007  . BACK PAIN, LUMBAR 03/07/2009  . BACK PAIN, UPPER 12/05/2010  . 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 COLONIC POLYPS, HX OF 08/25/2008  . CORONARY ARTERY DISEASE 05/22/2008  . DIVERTICULITIS, COLON 12/08/2008  . GERD 06/30/2007  . HIATAL HERNIA 08/25/2008  . HYPERKERATOSIS 10/19/2009  . HYPERLIPIDEMIA 10/18/2008  . HYPERTENSION 06/30/2007  . HYPOTHYROIDISM 10/18/2008  . Irritable bowel syndrome 12/08/2008  . OSTEOARTHRITIS 05/22/2008  . OSTEOPENIA 10/18/2008  . PEPTIC ULCER DISEASE, HX OF 06/30/2007  . RENAL FAILURE 12/06/2009  . THROMBOCYTOPENIA NOS 06/30/2007  . UNS ADVRS EFF OTH RX MEDICINAL&BIOLOGICAL SBSTNC 10/18/2008  . VITAMIN B12 DEFICIENCY 09/02/2008  . Carotid artery disease   . Neck pain, chronic   . CKD (chronic kidney disease) 12/06/2011  . 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 Gallstones   . COPD (chronic obstructive pulmonary disease) 12/07/2011  . Carotid stenosis 12/07/2011   Past Surgical History  Procedure Date  . Foot surgery     left  . Carpal tunnel release   . Hernia repair   . Total knee arthroplasty   . Carotid endarterectomy   . Amputation     left index finger -tramatic  . Vagotomy     partial gastrectomy  . Partial  gastrectomy   . Corneal transplant   . Rotator cuff repair     left  . Spine surgery 1985    cervical laminectomy    reports that he quit smoking about 47 years ago. He has never used smokeless tobacco. He reports that he does not drink alcohol or use illicit drugs. family history is not on file. Allergies  Allergen Reactions  . Amlodipine Besylate Hives  . Naproxen Diarrhea  . Oxycodone-Acetaminophen Other (See Comments)    REACTION: unspecified   Current Outpatient Prescriptions on File Prior to Visit  Medication Sig Dispense Refill  . aspirin EC 81 MG tablet Take 81 mg by mouth every other day.        . brinzolamide (AZOPT) 1 % ophthalmic suspension Place 1 drop into the left eye every 12 (twelve) hours.       . methocarbamol (ROBAXIN) 500 MG tablet Take 500 mg by mouth every 6 (six) hours as needed. For spasms       . metoprolol (TOPROL-XL) 50 MG 24 hr tablet Take 1 tablet (50 mg total) by mouth daily.  90 tablet  3  . nitroGLYCERIN (NITROSTAT) 0.4 MG SL tablet Place 0.4 mg under the tongue every 5 (five) minutes as needed. For chest pain.      . simvastatin (ZOCOR) 20 MG tablet Take 1 tablet (20 mg total) by mouth daily.  30 tablet  1  . travoprost, benzalkonium, (TRAVATAN) 0.004 % ophthalmic solution  Place 1 drop into both eyes at bedtime.        . traMADol (ULTRAM) 50 MG tablet ONE HALF to 1 tablet PO q6h prn pain.  Maximum dose= 8 tablets per day  10 tablet  0   Review of Systems Review of Systems  Constitutional: Negative for diaphoresis and unexpected weight change.  HENT: Negative for drooling and tinnitus.   Eyes: Negative for photophobia and visual disturbance.  Respiratory: Negative for choking and stridor.   Gastrointestinal: Negative for vomiting and blood in stool.     Objective:   Physical Exam BP 124/68  Pulse 64  Temp(Src) 97.5 F (36.4 C) (Oral)  Ht 5\' 7"  (1.702 m)  Wt 144 lb 9.6 oz (65.59 kg)  BMI 22.65 kg/m2  SpO2 98% Physical Exam  VS  noted Constitutional: Pt appears well-developed and well-nourished.  HENT: Head: Normocephalic.  Right Ear: External ear normal.  Left Ear: External ear normal.  Eyes: Conjunctivae and EOM are normal. Pupils are equal, round, and reactive to light.  Neck: Normal range of motion. Neck supple.  Cardiovascular: Normal rate and regular rhythm.   Pulmonary/Chest: Effort normal and breath sounds normal.  Neurological: Pt is alert. No cranial nerve deficit.  Skin: Skin is warm. No erythema.  Psychiatric: Pt behavior is normal. Thought content normal.     Assessment & Plan:

## 2011-12-22 NOTE — Assessment & Plan Note (Signed)
stable overall by hx and exam, most recent data reviewed with pt, and pt to continue medical treatment as before Lab Results  Component Value Date   LDLCALC 48 12/06/2011

## 2011-12-22 NOTE — Assessment & Plan Note (Signed)
For f/u K off the ACE,  to f/u any worsening symptoms or concerns

## 2011-12-22 NOTE — Assessment & Plan Note (Signed)
stable overall by hx and exam, most recent data reviewed with pt, and pt to continue medical treatment as before  BP Readings from Last 3 Encounters:  12/20/11 124/68  12/06/11 120/72  12/04/11 158/76

## 2011-12-22 NOTE — Assessment & Plan Note (Signed)
stable overall by hx and exam, most recent data reviewed with pt, and pt to continue medical treatment as before  Lab Results  Component Value Date   TSH 4.11 12/06/2011

## 2011-12-25 DIAGNOSIS — L570 Actinic keratosis: Secondary | ICD-10-CM | POA: Diagnosis not present

## 2012-01-01 DIAGNOSIS — H4011X0 Primary open-angle glaucoma, stage unspecified: Secondary | ICD-10-CM | POA: Diagnosis not present

## 2012-01-03 ENCOUNTER — Ambulatory Visit: Payer: Medicare Other | Admitting: Internal Medicine

## 2012-01-23 ENCOUNTER — Other Ambulatory Visit: Payer: Self-pay | Admitting: Internal Medicine

## 2012-02-05 ENCOUNTER — Encounter: Payer: Self-pay | Admitting: Internal Medicine

## 2012-02-05 ENCOUNTER — Ambulatory Visit (INDEPENDENT_AMBULATORY_CARE_PROVIDER_SITE_OTHER): Payer: Medicare Other | Admitting: Internal Medicine

## 2012-02-05 ENCOUNTER — Emergency Department (HOSPITAL_COMMUNITY): Payer: Medicare Other

## 2012-02-05 ENCOUNTER — Encounter (HOSPITAL_COMMUNITY): Payer: Self-pay | Admitting: *Deleted

## 2012-02-05 ENCOUNTER — Emergency Department (HOSPITAL_COMMUNITY)
Admission: EM | Admit: 2012-02-05 | Discharge: 2012-02-05 | Disposition: A | Discharge status: Home / Self Care | Payer: Medicare Other | Attending: Emergency Medicine | Admitting: Emergency Medicine

## 2012-02-05 VITALS — BP 120/70 | HR 61 | Temp 98.0°F | Ht 67.0 in | Wt 146.0 lb

## 2012-02-05 DIAGNOSIS — R259 Unspecified abnormal involuntary movements: Secondary | ICD-10-CM

## 2012-02-05 DIAGNOSIS — I10 Essential (primary) hypertension: Secondary | ICD-10-CM

## 2012-02-05 DIAGNOSIS — R2689 Other abnormalities of gait and mobility: Secondary | ICD-10-CM

## 2012-02-05 DIAGNOSIS — R251 Tremor, unspecified: Secondary | ICD-10-CM

## 2012-02-05 DIAGNOSIS — R509 Fever, unspecified: Secondary | ICD-10-CM | POA: Diagnosis not present

## 2012-02-05 DIAGNOSIS — R110 Nausea: Secondary | ICD-10-CM | POA: Diagnosis not present

## 2012-02-05 DIAGNOSIS — E039 Hypothyroidism, unspecified: Secondary | ICD-10-CM | POA: Insufficient documentation

## 2012-02-05 DIAGNOSIS — R5381 Other malaise: Secondary | ICD-10-CM | POA: Diagnosis not present

## 2012-02-05 DIAGNOSIS — N189 Chronic kidney disease, unspecified: Secondary | ICD-10-CM | POA: Diagnosis not present

## 2012-02-05 DIAGNOSIS — I129 Hypertensive chronic kidney disease with stage 1 through stage 4 chronic kidney disease, or unspecified chronic kidney disease: Secondary | ICD-10-CM | POA: Diagnosis not present

## 2012-02-05 DIAGNOSIS — I2510 Atherosclerotic heart disease of native coronary artery without angina pectoris: Secondary | ICD-10-CM | POA: Diagnosis not present

## 2012-02-05 DIAGNOSIS — Z79899 Other long term (current) drug therapy: Secondary | ICD-10-CM | POA: Insufficient documentation

## 2012-02-05 DIAGNOSIS — M545 Low back pain: Secondary | ICD-10-CM | POA: Diagnosis not present

## 2012-02-05 DIAGNOSIS — R918 Other nonspecific abnormal finding of lung field: Secondary | ICD-10-CM | POA: Diagnosis not present

## 2012-02-05 DIAGNOSIS — I6789 Other cerebrovascular disease: Secondary | ICD-10-CM | POA: Diagnosis not present

## 2012-02-05 DIAGNOSIS — R269 Unspecified abnormalities of gait and mobility: Secondary | ICD-10-CM | POA: Diagnosis not present

## 2012-02-05 LAB — URINALYSIS, ROUTINE W REFLEX MICROSCOPIC
Glucose, UA: NEGATIVE mg/dL
Ketones, ur: NEGATIVE mg/dL
Leukocytes, UA: NEGATIVE
pH: 5.5 (ref 5.0–8.0)

## 2012-02-05 LAB — CBC
MCH: 33 pg (ref 26.0–34.0)
MCHC: 35.9 g/dL (ref 30.0–36.0)
Platelets: 74 10*3/uL — ABNORMAL LOW (ref 150–400)
RDW: 13.8 % (ref 11.5–15.5)

## 2012-02-05 LAB — COMPREHENSIVE METABOLIC PANEL
ALT: 15 U/L (ref 0–53)
Albumin: 3.6 g/dL (ref 3.5–5.2)
Alkaline Phosphatase: 86 U/L (ref 39–117)
BUN: 38 mg/dL — ABNORMAL HIGH (ref 6–23)
Potassium: 4.2 mEq/L (ref 3.5–5.1)
Sodium: 136 mEq/L (ref 135–145)
Total Protein: 6.7 g/dL (ref 6.0–8.3)

## 2012-02-05 LAB — DIFFERENTIAL
Basophils Relative: 0 % (ref 0–1)
Eosinophils Absolute: 0 10*3/uL (ref 0.0–0.7)
Neutrophils Relative %: 57 % (ref 43–77)

## 2012-02-05 LAB — CARDIAC PANEL(CRET KIN+CKTOT+MB+TROPI)
CK, MB: 2.9 ng/mL (ref 0.3–4.0)
Total CK: 151 U/L (ref 7–232)
Troponin I: <0.3 ng/mL (ref <0.30)

## 2012-02-05 MED ORDER — CYCLOBENZAPRINE HCL 5 MG PO TABS: 5.0000 mg | ORAL_TABLET | Freq: Three times a day (TID) | ORAL | Status: AC | PRN

## 2012-02-05 MED ORDER — IBUPROFEN 800 MG PO TABS
800.0000 mg | ORAL_TABLET | Freq: Once | ORAL | Status: AC
  Administered 2012-02-05: 800 mg via ORAL
  Filled 2012-02-05: qty 1

## 2012-02-05 NOTE — ED Provider Notes (Signed)
After numerous calls neurology did not return consult. In an effort to facilitate the patient's disposition I spoke with his primary care provider Dr. Cathlean Cower. He recommended discontinuing the patient's Zocor. The patient will followup in clinic prior to the weekend. If they have any other problems they can contact Dr. Jenny Reichmann as needed.  Diagnosis: Parkinson's like tremor and shuffling gait  Plan: We'll discharge home in good condition with instructions to discontinue Zocor. Patient will follow up with his primary later this week.  Chauncy Passy, MD 02/05/12 [phone]

## 2012-02-05 NOTE — Discharge Instructions (Signed)
Fall Prevention, Elderly Falls are the leading cause of injuries, accidents, and accidental deaths in people over the age of 67. Falling is a real threat to your ability to live on your own. CAUSES   Poor eyesight or poor hearing can make you more likely to fall.   Illnesses and physical conditions can affect your strength and balance.   Poor lighting, throw rugs and pets in your home can make you more likely to trip or slip.   The side effects of some medicines can upset your balance and lead to falling. These include medicines for depression, sleep problems, high blood pressure, diabetes, and heart conditions.  PREVENTION  Be sure your home is as safe as possible. Here are some tips:  Wear shoes with non-skid soles (not house slippers).   Be sure your home and outside area are well lit.   Use night lights throughout your house, including hallways and stairways.   Remove clutter and clean up spills on floors and walkways.   Remove throw rugs or fasten them to the floor with carpet tape. Tack down carpet edges.   Do not place electrical cords across pathways.   Install grab bars in your bathtub, shower, and toilet area. Towel bars should not be used as a grab bar.   Install handrails on both sides of stairways.   Do not climb on stools or stepladders. Get someone else to help with jobs that require climbing.   Do not wax your floors at all, or use a non-skid wax.   Repair uneven or unsafe sidewalks, walkways or stairs.   Keep frequently used items within reach.   Be aware of pets so you do not trip.  Get regular check-ups from your doctor, and take good care of yourself:  Have your eyes checked every year for vision changes, cataracts, glaucoma, and other eye problems. Wear eyeglasses as directed.   Have your hearing checked every 2 years, or anytime you or others think that you cannot hear well. Use hearing aids as directed.   See your caregiver if you have foot pain or  corns. Sore feet can contribute to falls.   Let your caregiver know if a medicine is making you feel dizzy or making you lose your balance.   Use a cane, walker, or wheelchair as directed. Use walker or wheelchair brakes when getting in and out.   When you get up from bed, sit on the side of the bed for 1 to 2 minutes before you stand up. This will give your blood pressure time to adjust, and you will feel less dizzy.   If you need to go to the bathroom often, consider using a bedside commode.  Keep your body in good shape:  Get regular exercise, especially walking.   Do exercises to strengthen the muscles you use for walking and lifting.   Do not smoke.   Minimize use of alcohol.  SEEK IMMEDIATE MEDICAL CARE IF:   You feel dizzy, weak, or unsteady on your feet.   You feel confused.   You fall.  Document Released: 12/09/2005 Document Revised: 08/21/2011 Document Reviewed: 06/05/2007 [facility] Patient Information 2012 [place].Tremor Tremor is a rhythmic, involuntary muscular contraction characterized by oscillations (to-and-fro movements) of a part of the body. The most common of all involuntary movements, tremor can affect various body parts such as the hands, head, facial structures, vocal cords, trunk, and legs; most tremors, however, occur in the hands. Tremor often accompanies neurological disorders associated  with aging. Although the disorder is not life-threatening, it can be responsible for functional disability and social embarrassment. TREATMENT  There are many types of tremor and several ways in which tremor is classified. The most common classification is by behavioral context or position. There are five categories of tremor within this classification: resting, postural, kinetic, task-specific, and psychogenic. Resting or static tremor occurs when the muscle is at rest, for example when the hands are lying on the lap. This type of tremor is often seen in patients with  Parkinson's disease. Postural tremor occurs when a patient attempts to maintain posture, such as holding the hands outstretched. Postural tremors include physiological tremor, essential tremor, tremor with basal ganglia disease (also seen in patients with Parkinson's disease), cerebellar postural tremor, tremor with peripheral neuropathy, post-traumatic tremor, and alcoholic tremor. Kinetic or intention (action) tremor occurs during purposeful movement, for example during finger-to-nose testing. Task-specific tremor appears when performing goal-oriented tasks such as handwriting, speaking, or standing. This group consists of primary writing tremor, vocal tremor, and orthostatic tremor. Psychogenic tremor occurs in both older and younger patients. The key feature of this tremor is that it dramatically lessens or disappears when the patient is distracted. PROGNOSIS There are some treatment options available for tremor; the appropriate treatment depends on accurate diagnosis of the cause. Some tremors respond to treatment of the underlying condition, for example in some cases of psychogenic tremor treating the patient's underlying mental problem may cause the tremor to disappear. Also, patients with tremor due to Parkinson's disease may be treated with Levodopa drug therapy. Symptomatic drug therapy is available for several other tremors as well. For those cases of tremor in which there is no effective drug treatment, physical measures such as teaching the patient to brace the affected limb during the tremor are sometimes useful. Surgical intervention such as thalamotomy or deep brain stimulation may be useful in certain cases. Document Released: 11/29/2002 Document Revised: 08/21/2011 Document Reviewed: 12/09/2005 [facility] Patient Information 2012 [place].

## 2012-02-05 NOTE — ED Notes (Signed)
Pt reports that he has been having intermittent feelings of being cold and chilly and has been having episodes of shivering since Monday and Tuesday.  No cough noted,  Denies any difficulty of breathing.

## 2012-02-05 NOTE — ED Notes (Signed)
Pt in with intermittent fever since Monday night, c/o body aches and chills, denies other symptoms, no fever at this time

## 2012-02-05 NOTE — ED Provider Notes (Signed)
History     CSN: [id number]  Arrival date & time 02/05/12  0535   First MD Initiated Contact with Patient 02/05/12 0617      Chief Complaint  Patient presents with  . Fever    (Consider location/radiation/quality/duration/timing/severity/associated sxs/prior treatment) HPI Comments: Patient presents from home with two days of fever and shaking chills.  Wife states he was well until Monday night when he began complaining of body aches and had a fever of 102.8.  Patient's only complaint is that he "can't stop shaking". This morning he had a fever to 100.8 so they decided to be evaluated.  Denies cough, rhinorrhea, sore throat, chest pain SOB.  No urinary symptoms, abdominal pain, nausea or vomiting. Good PO intake and urine output.  No medication changes, no known sick contacts.  The history is provided by the patient and the spouse.    Past Medical History  Diagnosis Date  . ANEMIA, B12 DEFICIENCY 11/04/2008  . ARTHRITIS, HX OF 06/30/2007  . BACK PAIN, LUMBAR 03/07/2009  . BACK PAIN, UPPER 12/05/2010  . 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 COLONIC POLYPS, HX OF 08/25/2008  . CORONARY ARTERY DISEASE 05/22/2008  . DIVERTICULITIS, COLON 12/08/2008  . GERD 06/30/2007  . HIATAL HERNIA 08/25/2008  . HYPERKERATOSIS 10/19/2009  . HYPERLIPIDEMIA 10/18/2008  . HYPERTENSION 06/30/2007  . HYPOTHYROIDISM 10/18/2008  . Irritable bowel syndrome 12/08/2008  . OSTEOARTHRITIS 05/22/2008  . OSTEOPENIA 10/18/2008  . PEPTIC ULCER DISEASE, HX OF 06/30/2007  . RENAL FAILURE 12/06/2009  . THROMBOCYTOPENIA NOS 06/30/2007  . UNS ADVRS EFF OTH RX MEDICINAL&BIOLOGICAL SBSTNC 10/18/2008  . VITAMIN B12 DEFICIENCY 09/02/2008  . Carotid artery disease   . Neck pain, chronic   . CKD (chronic kidney disease) 12/06/2011  . 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 Gallstones   . COPD (chronic obstructive pulmonary disease) 12/07/2011  . Carotid stenosis 12/07/2011    Past Surgical History  Procedure Date  . Foot  surgery     left  . Carpal tunnel release   . Hernia repair   . Total knee arthroplasty   . Carotid endarterectomy   . Amputation     left index finger -tramatic  . Vagotomy     partial gastrectomy  . Partial gastrectomy   . Corneal transplant   . Rotator cuff repair     left  . Spine surgery 1985    cervical laminectomy    History reviewed. No pertinent family history.  History  Substance Use Topics  . Smoking status: Former Smoker    Quit date: 12/23/1964  . Smokeless tobacco: Never Used  . Alcohol Use: No      Review of Systems  Constitutional: Positive for fever, chills and fatigue.  HENT: Negative for congestion, sore throat, rhinorrhea and neck pain.   Eyes: Negative for visual disturbance.  Respiratory: Negative for cough and chest tightness.   Cardiovascular: Negative for chest pain.  Gastrointestinal: Positive for nausea. Negative for vomiting and abdominal pain.  Genitourinary: Negative for dysuria and hematuria.  Musculoskeletal: Positive for myalgias, arthralgias and gait problem.  Skin: Negative for rash.  Neurological: Positive for tremors. Negative for seizures and headaches.    Allergies  Amlodipine besylate; Naproxen; and Oxycodone-acetaminophen  Home Medications   Current Outpatient Rx  Name Route Sig Dispense Refill  . ASPIRIN EC 81 MG PO TBEC Oral Take 81 mg by mouth every other day.      Marland Kitchen BRINZOLAMIDE 1 % OP SUSP Left Eye Place 1 drop into the  left eye every 12 (twelve) hours.     Marland Kitchen METHOCARBAMOL 500 MG PO TABS Oral Take 500 mg by mouth every 6 (six) hours as needed. For spasms     . METOPROLOL SUCCINATE ER 50 MG PO TB24 Oral Take 1 tablet (50 mg total) by mouth daily. 90 tablet 3  . TRAVOPROST 0.004 % OP SOLN Both Eyes Place 1 drop into both eyes at bedtime.      Marland Kitchen NITROGLYCERIN 0.4 MG SL SUBL Sublingual Place 0.4 mg under the tongue every 5 (five) minutes as needed. For chest pain.      BP 160/62  Pulse 59  Temp(Src) 98 F (36.7 C)  (Oral)  Resp 17  SpO2 99%  Physical Exam  Constitutional: He is oriented to person, place, and time. He appears well-developed and well-nourished. No distress.       Bilateral upper extremity resting tremors  HENT:  Head: Normocephalic and atraumatic.  Mouth/Throat: Oropharynx is clear and moist. No oropharyngeal exudate.  Eyes: Conjunctivae and EOM are normal. Pupils are equal, round, and reactive to light.  Neck: Normal range of motion. Neck supple.  Cardiovascular: Normal rate, regular rhythm and normal heart sounds.   Pulmonary/Chest: Effort normal and breath sounds normal. No respiratory distress. He has no wheezes.  Abdominal: Soft. There is no tenderness. There is no rebound and no guarding.  Musculoskeletal: Normal range of motion. He exhibits no edema and no tenderness.  Neurological: He is alert and oriented to person, place, and time. No cranial nerve deficit. Coordination abnormal.       Cranial nerves 2-12 intact, 5/5 strength throughout.  Some increased tone in upper extremities with possible mild cogwheeling.  Deliberate on finger to nose with persistent tremor.  Shuffling gait which wife states is not baseline.  Skin: Skin is warm.    ED Course  Procedures (including critical care time)  Labs Reviewed  CBC - Abnormal; Notable for the following:    Platelets 74 (*)    All other components within normal limits  DIFFERENTIAL - Abnormal; Notable for the following:    Monocytes Relative 20 (*)    Monocytes Absolute 1.2 (*)    All other components within normal limits  COMPREHENSIVE METABOLIC PANEL - Abnormal; Notable for the following:    Glucose, Bld 116 (*)    BUN 38 (*)    Creatinine, Ser 1.76 (*)    GFR calc non Af Amer 35 (*)    GFR calc Af Amer 40 (*)    All other components within normal limits  URINALYSIS, ROUTINE W REFLEX MICROSCOPIC  RAPID STREP SCREEN  CARDIAC PANEL(CRET KIN+CKTOT+MB+TROPI)   Dg Chest 2 View  02/05/2012  *RADIOLOGY REPORT*  Clinical  Data: Fever.  Shaking.  Weakness.  CHEST - 2 VIEW  Comparison: 11/19/2010  Findings: Normal heart size and pulmonary vascularity. Emphysematous changes in the lungs.  No focal airspace consolidation.  No blunting of costophrenic angles.  No pneumothorax.  Surgical clips in the base of the neck and EG junction region.  Degenerative changes in the thoracic spine. Calcification of the aorta.  No significant changes since the previous study.  IMPRESSION: Emphysematous changes in the lungs.  No evidence of active pulmonary disease.  Original Report Authenticated By: Neale Burly, M.D.   Ct Head Wo Contrast  02/05/2012  *RADIOLOGY REPORT*  Clinical Data: Fever and chills, tremors, renal failure  CT HEAD WITHOUT CONTRAST  Technique:  Contiguous axial images were obtained from the base of the  skull through the vertex without contrast.  Comparison: No priors  Findings: There is no evidence for acute infarction, intracranial hemorrhage, mass lesion, hydrocephalus, or extra-axial fluid.  Mild atrophy is present.  Chronic microvascular ischemic changes present in the periventricular and subcortical white matter.  There is advanced calcification particularly in the left vertebral and bilateral carotid siphons.  Calvarium is intact.  Clear sinuses and mastoids.  IMPRESSION: Atrophy and small vessel disease.  No acute intracranial findings.  Original Report Authenticated By: Staci Righter, M.D.     1. Tremor   2. Shuffling gait       MDM  Shaking chills and intermittent fever at home.  Vitals stable, no distress.  No evidence of infection on exam.  COncern "shaking" is not related to fever and may represent Parkinsonism. Tremors, shuffling gait, increased tone.  Infectious workup negative.  Patient nontoxic in appearance.  Will obtain CT head and discuss case with neurology.  Disposition signed out to Dr. Geoffry Paradise.        Ezequiel Essex, MD 02/05/12 [phone]

## 2012-02-05 NOTE — Patient Instructions (Addendum)
OK to stop the robaxin (methocarbamol) Take all new medications as prescribed - the generic flexeril as needed for back spasms Continue all other medications as before, including the simvastatin for cholesterol

## 2012-02-05 NOTE — ED Notes (Signed)
Patient transported to CT 

## 2012-02-05 NOTE — ED Notes (Signed)
Patient transported to X-ray 

## 2012-02-05 NOTE — ED Notes (Signed)
Pt returns to room 

## 2012-02-05 NOTE — ED Notes (Signed)
Dr. Wyvonnia Dusky gave permission for pt to use eye drops from home.

## 2012-02-09 ENCOUNTER — Encounter: Payer: Self-pay | Admitting: Internal Medicine

## 2012-02-09 NOTE — Assessment & Plan Note (Addendum)
stable overall by hx and exam, , and pt to continue medical treatment as before except change the robaxin to flexeril prn

## 2012-02-09 NOTE — Progress Notes (Signed)
Subjective:    Patient ID: Nicholas Porter, male    DOB: April 09, 1930, 78 y.o.   MRN: [id number]  HPI  Here to f/u with c/o recurring and persistent shakes for 2-3 days that he wants to attribute to robaxin that has been helpful for recurring LBP, last pill taken was Sun Feb 10, with onset shakes on Mon feb 11; also with temp 102 at home mon evening, with less fever but still 100.8 this am;  Seen in ER earlier today with neg cxr, CT head, UA, CBC, and routine labs as per emr, has taken tylenol earlier today and overall improved now, but felt to have tremor and gait abnormal suggestive of parkinson and referred here. By this aftertnoon shakes have improved, overall feels somewhat improved.  Pt denies chest pain, increased sob or doe, wheezing, orthopnea, PND, increased LE swelling, palpitations, dizziness or syncope.  Pt denies new neurological symptoms such as new headache, or facial or extremity weakness or numbness   Pt denies polydipsia, polyuria,   Denies urinary symptoms such as dysuria, frequency, urgency,or hematuria.  No rash.  No other acute complaiint. Pt continues to have recurring LBP without change in severity, bowel or bladder change, fever, wt loss,  worsening LE pain/numbness/weakness, gait change or falls. Past Medical History  Diagnosis Date  . ANEMIA, B12 DEFICIENCY 11/04/2008  . ARTHRITIS, HX OF 06/30/2007  . BACK PAIN, LUMBAR 03/07/2009  . BACK PAIN, UPPER 12/05/2010  . 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 COLONIC POLYPS, HX OF 08/25/2008  . CORONARY ARTERY DISEASE 05/22/2008  . DIVERTICULITIS, COLON 12/08/2008  . GERD 06/30/2007  . HIATAL HERNIA 08/25/2008  . HYPERKERATOSIS 10/19/2009  . HYPERLIPIDEMIA 10/18/2008  . HYPERTENSION 06/30/2007  . HYPOTHYROIDISM 10/18/2008  . Irritable bowel syndrome 12/08/2008  . OSTEOARTHRITIS 05/22/2008  . OSTEOPENIA 10/18/2008  . PEPTIC ULCER DISEASE, HX OF 06/30/2007  . RENAL FAILURE 12/06/2009  . THROMBOCYTOPENIA NOS 06/30/2007  . UNS ADVRS EFF OTH RX  MEDICINAL&BIOLOGICAL SBSTNC 10/18/2008  . VITAMIN B12 DEFICIENCY 09/02/2008  . Carotid artery disease   . Neck pain, chronic   . CKD (chronic kidney disease) 12/06/2011  . 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 Gallstones   . COPD (chronic obstructive pulmonary disease) 12/07/2011  . Carotid stenosis 12/07/2011   Past Surgical History  Procedure Date  . Foot surgery     left  . Carpal tunnel release   . Hernia repair   . Total knee arthroplasty   . Carotid endarterectomy   . Amputation     left index finger -tramatic  . Vagotomy     partial gastrectomy  . Partial gastrectomy   . Corneal transplant   . Rotator cuff repair     left  . Spine surgery 1985    cervical laminectomy    reports that he quit smoking about 47 years ago. He has never used smokeless tobacco. He reports that he does not drink alcohol or use illicit drugs. family history is not on file. Allergies  Allergen Reactions  . Amlodipine Besylate Hives  . Naproxen Diarrhea  . Oxycodone-Acetaminophen Other (See Comments)    REACTION: unspecified   Current Outpatient Prescriptions on File Prior to Visit  Medication Sig Dispense Refill  . aspirin EC 81 MG tablet Take 81 mg by mouth every other day.        . brinzolamide (AZOPT) 1 % ophthalmic suspension Place 1 drop into the left eye every 12 (twelve) hours.       . methocarbamol (ROBAXIN)  500 MG tablet Take 500 mg by mouth every 6 (six) hours as needed. For spasms       . metoprolol (TOPROL-XL) 50 MG 24 hr tablet Take 1 tablet (50 mg total) by mouth daily.  90 tablet  3  . nitroGLYCERIN (NITROSTAT) 0.4 MG SL tablet Place 0.4 mg under the tongue every 5 (five) minutes as needed. For chest pain.      Marland Kitchen travoprost, benzalkonium, (TRAVATAN) 0.004 % ophthalmic solution Place 1 drop into both eyes at bedtime.          Review of Systems Review of Systems  Constitutional: Negative for diaphoresis and unexpected weight change.  HENT: Negative for drooling and  tinnitus.   Eyes: Negative for photophobia and visual disturbance.  Respiratory: Negative for choking and stridor.   Gastrointestinal: Negative for vomiting and blood in stool.  Genitourinary: Negative for hematuria and decreased urine volume.  Skin: Negative for color change and wound.  Psychiatric/Behavioral: Negative for decreased concentration. The patient is not hyperactive.       Objective:   Physical Exam BP 120/70  Pulse 61  Temp(Src) 98 F (36.7 C) (Oral)  Ht 5\' 7"  (1.702 m)  Wt 146 lb (66.225 kg)  BMI 22.87 kg/m2  SpO2 97% Physical Exam  VS noted, fatigued Constitutional: Pt appears well-developed and well-nourished.  HENT: Head: Normocephalic.  Right Ear: External ear normal.  Left Ear: External ear normal.  Eyes: Conjunctivae and EOM are normal. Pupils are equal, round, and reactive to light.  Neck: Normal range of motion. Neck supple.  Cardiovascular: Normal rate and regular rhythm. No murmur apprec, new or o/w   Pulmonary/Chest: Effort normal and breath sounds normal.  Abd:  Soft, NT, non-distended, + BS Neurological: Pt is alert. No cranial nerve deficit. gait slow but not shuffling or wide based at this time,  UE's do seem somewhat tremulous Skin: Skin is warm. No erythema. No rash Psychiatric: Pt behavior is normal. Thought content normal.     Assessment & Plan:

## 2012-02-09 NOTE — Assessment & Plan Note (Signed)
stable overall by hx and exam, most recent data reviewed with pt, and pt to continue medical treatment as before  BP Readings from Last 3 Encounters:  02/05/12 120/70  02/05/12 160/62  12/20/11 124/68

## 2012-02-09 NOTE — Assessment & Plan Note (Signed)
With onset for several days I think more related to current febrile illness than new neurologic or med related phenomenon, but hard to define since no obvious source or fever in evidence - ? Viral;  For now will defer to pt and d/c robaxin, ok to re-start statin though as less likely to cause this, for tylenol 650 prn, consider neurologic consult if persists or worsens, hold antibx for now

## 2012-03-27 DIAGNOSIS — H4011X0 Primary open-angle glaucoma, stage unspecified: Secondary | ICD-10-CM | POA: Diagnosis not present

## 2012-04-06 ENCOUNTER — Encounter: Payer: Self-pay | Admitting: Internal Medicine

## 2012-04-06 ENCOUNTER — Other Ambulatory Visit (INDEPENDENT_AMBULATORY_CARE_PROVIDER_SITE_OTHER): Payer: Medicare Other

## 2012-04-06 ENCOUNTER — Ambulatory Visit (INDEPENDENT_AMBULATORY_CARE_PROVIDER_SITE_OTHER): Payer: Medicare Other | Admitting: Internal Medicine

## 2012-04-06 VITALS — BP 132/78 | HR 75 | Temp 98.2°F | Ht 67.0 in | Wt 148.5 lb

## 2012-04-06 DIAGNOSIS — E039 Hypothyroidism, unspecified: Secondary | ICD-10-CM | POA: Diagnosis not present

## 2012-04-06 DIAGNOSIS — I10 Essential (primary) hypertension: Secondary | ICD-10-CM

## 2012-04-06 DIAGNOSIS — E785 Hyperlipidemia, unspecified: Secondary | ICD-10-CM

## 2012-04-06 DIAGNOSIS — N189 Chronic kidney disease, unspecified: Secondary | ICD-10-CM

## 2012-04-06 LAB — URINALYSIS, ROUTINE W REFLEX MICROSCOPIC
Bilirubin Urine: NEGATIVE
Ketones, ur: NEGATIVE
Nitrite: NEGATIVE
Specific Gravity, Urine: 1.025 (ref 1.000–1.030)
pH: 5.5 (ref 5.0–8.0)

## 2012-04-06 LAB — BASIC METABOLIC PANEL
BUN: 32 mg/dL — ABNORMAL HIGH (ref 6–23)
Calcium: 9.5 mg/dL (ref 8.4–10.5)
Creatinine, Ser: 1.6 mg/dL — ABNORMAL HIGH (ref 0.4–1.5)
GFR: 44.4 mL/min — ABNORMAL LOW (ref >60.00)
Glucose, Bld: 113 mg/dL — ABNORMAL HIGH (ref 70–99)
Sodium: 141 mEq/L (ref 135–145)

## 2012-04-06 LAB — LIPID PANEL
Cholesterol: 111 mg/dL (ref 0–200)
HDL: 44.3 mg/dL (ref >39.00)
LDL Cholesterol: 57 mg/dL (ref 0–99)
Total CHOL/HDL Ratio: 3
Triglycerides: 50 mg/dL (ref 0.0–149.0)

## 2012-04-06 LAB — CBC WITH DIFFERENTIAL/PLATELET
Basophils Relative: 0.4 % (ref 0.0–3.0)
Eosinophils Relative: 1.8 % (ref 0.0–5.0)
HCT: 44.4 % (ref 39.0–52.0)
Hemoglobin: 15.1 g/dL (ref 13.0–17.0)
Lymphocytes Relative: 27.5 % (ref 12.0–46.0)
Lymphs Abs: 2.2 10*3/uL (ref 0.7–4.0)
Monocytes Relative: 10.8 % (ref 3.0–12.0)
Neutro Abs: 4.7 10*3/uL (ref 1.4–7.7)
RBC: 4.61 Mil/uL (ref 4.22–5.81)
WBC: 8 10*3/uL (ref 4.5–10.5)

## 2012-04-06 LAB — HEPATIC FUNCTION PANEL
Albumin: 4 g/dL (ref 3.5–5.2)
Alkaline Phosphatase: 79 U/L (ref 39–117)

## 2012-04-06 NOTE — Progress Notes (Signed)
Subjective:    Patient ID: Nicholas Porter, male    DOB: 1930/10/06, 78 y.o.   MRN: [id number]  HPI  Here to f/u;  Quite bright and energetic today, Pt denies chest pain, increased sob or doe, wheezing, orthopnea, PND, increased LE swelling, palpitations, dizziness or syncope.   Pt denies polydipsia, polyuria, Pt states overall good compliance with meds, trying to follow lower cholesterol,  wt overall stable but little exercise however.   Pt denies new neurological symptoms Porter as new headache, or facial or extremity weakness or numbness  Denies worsening depressive symptoms, suicidal ideation, or panic.  Pt denies fever, wt loss, night sweats, loss of appetite, or other constitutional symptoms . Really feels overall he is doing quite well, "I feel strong" and show no sign of slowing cognition today related to age. Denies hyper or hypo thyroid symptoms Porter as voice, skin or hair change. Past Medical History  Diagnosis Date  . ANEMIA, B12 DEFICIENCY 11/04/2008  . ARTHRITIS, HX OF 06/30/2007  . BACK PAIN, LUMBAR 03/07/2009  . BACK PAIN, UPPER 12/05/2010  . 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 COLONIC POLYPS, HX OF 08/25/2008  . CORONARY ARTERY DISEASE 05/22/2008  . DIVERTICULITIS, COLON 12/08/2008  . GERD 06/30/2007  . HIATAL HERNIA 08/25/2008  . HYPERKERATOSIS 10/19/2009  . HYPERLIPIDEMIA 10/18/2008  . HYPERTENSION 06/30/2007  . HYPOTHYROIDISM 10/18/2008  . Irritable bowel syndrome 12/08/2008  . OSTEOARTHRITIS 05/22/2008  . OSTEOPENIA 10/18/2008  . PEPTIC ULCER DISEASE, HX OF 06/30/2007  . RENAL FAILURE 12/06/2009  . THROMBOCYTOPENIA NOS 06/30/2007  . UNS ADVRS EFF OTH RX MEDICINAL&BIOLOGICAL SBSTNC 10/18/2008  . VITAMIN B12 DEFICIENCY 09/02/2008  . Carotid artery disease   . Neck pain, chronic   . CKD (chronic kidney disease) 12/06/2011  . 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 Gallstones   . COPD (chronic obstructive pulmonary disease) 12/07/2011  . Carotid stenosis 12/07/2011   Past Surgical  History  Procedure Date  . Foot surgery     left  . Carpal tunnel release   . Hernia repair   . Total knee arthroplasty   . Carotid endarterectomy   . Amputation     left index finger -tramatic  . Vagotomy     partial gastrectomy  . Partial gastrectomy   . Corneal transplant   . Rotator cuff repair     left  . Spine surgery 1985    cervical laminectomy    reports that he quit smoking about 47 years ago. He has never used smokeless tobacco. He reports that he does not drink alcohol or use illicit drugs. family history is not on file. Allergies  Allergen Reactions  . Amlodipine Besylate Hives  . Naproxen Diarrhea  . Oxycodone-Acetaminophen Other (See Comments)    REACTION: unspecified   Current Outpatient Prescriptions on File Prior to Visit  Medication Sig Dispense Refill  . aspirin EC 81 MG tablet Take 81 mg by mouth every other day.        . brinzolamide (AZOPT) 1 % ophthalmic suspension Place 1 drop into the left eye every 12 (twelve) hours.       . methocarbamol (ROBAXIN) 500 MG tablet Take 500 mg by mouth every 6 (six) hours as needed. For spasms       . metoprolol (TOPROL-XL) 50 MG 24 hr tablet Take 1 tablet (50 mg total) by mouth daily.  90 tablet  3  . nitroGLYCERIN (NITROSTAT) 0.4 MG SL tablet Place 0.4 mg under the tongue every 5 (five) minutes as  needed. For chest pain.      Marland Kitchen travoprost, benzalkonium, (TRAVATAN) 0.004 % ophthalmic solution Place 1 drop into both eyes at bedtime.         Review of Systems Review of Systems  Constitutional: Negative for diaphoresis and unexpected weight change.  Eyes: Negative for photophobia and visual disturbance.  Respiratory: Negative for choking and stridor.   Gastrointestinal: Negative for vomiting and blood in stool.  Genitourinary: Negative for hematuria and decreased urine volume.  Musculoskeletal: Negative for gait problem.  Skin: Negative for color change and wound.  Neurological: Negative for tremors and numbness.  -  shakes from last visit resolved   Objective:   Physical Exam BP 132/78  Pulse 75  Temp(Src) 98.2 F (36.8 C) (Oral)  Ht 5\' 7"  (1.702 m)  Wt 148 lb 8 oz (67.359 kg)  BMI 23.26 kg/m2  SpO2 97% Physical Exam  VS noted, not ill appearing Constitutional: Pt appears well-developed and well-nourished.  HENT: Head: Normocephalic.  Right Ear: External ear normal.  Left Ear: External ear normal.  Eyes: Conjunctivae and EOM are normal. Pupils are equal, round, and reactive to light.  Neck: Normal range of motion. Neck supple.  Cardiovascular: Normal rate and regular rhythm.   Pulmonary/Chest: Effort normal and breath sounds normal.  Abd:  Soft, NT, non-distended, + BS Neurological: Pt is alert. Motor/gait intact  Skin: Skin is warm. No erythema. No rash Psychiatric: Pt behavior is normal. Thought content normal.     Assessment & Plan:

## 2012-04-06 NOTE — Assessment & Plan Note (Signed)
stable overall by hx and exam, most recent data reviewed with pt, and pt to continue medical treatment as before  BP Readings from Last 3 Encounters:  04/06/12 132/78  02/05/12 120/70  02/05/12 160/62

## 2012-04-06 NOTE — Assessment & Plan Note (Signed)
stable overall by hx and exam, most recent data reviewed with pt, and pt to continue medical treatment as before  Lab Results  Component Value Date   LDLCALC 48 12/06/2011   Goal ldl < 70

## 2012-04-06 NOTE — Assessment & Plan Note (Signed)
stable volume overall by hx and exam, most recent data reviewed with pt, and pt to continue medical treatment as before Lab Results  Component Value Date   WBC 5.8 02/05/2012   HGB 14.0 02/05/2012   HCT 39.0 02/05/2012   PLT 74* 02/05/2012   GLUCOSE 116* 02/05/2012   CHOL 110 12/06/2011   TRIG 79.0 12/06/2011   HDL 46.70 12/06/2011   LDLCALC 48 12/06/2011   ALT 15 02/05/2012   AST 30 02/05/2012   NA 136 02/05/2012   K 4.2 02/05/2012   CL 104 02/05/2012   CREATININE 1.76* 02/05/2012   BUN 38* 02/05/2012   CO2 22 02/05/2012   TSH 4.11 12/06/2011   PSA 5.66* 10/19/2009   INR 1.02 11/19/2010   HGBA1C 5.2 12/10/2011

## 2012-04-06 NOTE — Assessment & Plan Note (Signed)
stable overall by hx and exam, most recent data reviewed with pt, and pt to continue medical treatment as before  Lab Results  Component Value Date   TSH 4.11 12/06/2011

## 2012-04-06 NOTE — Patient Instructions (Signed)
Continue all other medications as before Please go to LAB in the Basement for the blood and/or urine tests to be done today You will be contacted by phone if any changes need to be made immediately.  Otherwise, you will receive a letter about your results with an explanation. Please return in 6 months, or sooner if needed  

## 2012-04-20 DIAGNOSIS — R079 Chest pain, unspecified: Secondary | ICD-10-CM | POA: Diagnosis not present

## 2012-04-20 DIAGNOSIS — I10 Essential (primary) hypertension: Secondary | ICD-10-CM | POA: Diagnosis not present

## 2012-04-20 DIAGNOSIS — E782 Mixed hyperlipidemia: Secondary | ICD-10-CM | POA: Diagnosis not present

## 2012-06-26 DIAGNOSIS — H4011X0 Primary open-angle glaucoma, stage unspecified: Secondary | ICD-10-CM | POA: Diagnosis not present

## 2012-07-20 ENCOUNTER — Other Ambulatory Visit: Payer: Self-pay

## 2012-07-20 MED ORDER — TRAMADOL HCL 50 MG PO TABS: 50.0000 mg | ORAL_TABLET | Freq: Four times a day (QID) | ORAL | Status: AC | PRN

## 2012-07-20 NOTE — Telephone Encounter (Signed)
Done erx 

## 2012-07-20 NOTE — Telephone Encounter (Signed)
Patient informed rx sent in for pain.

## 2012-07-20 NOTE — Telephone Encounter (Signed)
Called the patient informed of Md's response.  The patient stated he is having right leg/hip and groin pain when standing after sitting for a while.  He states he thinks may be arthritis pain and if tramadol would help that would be ok with him.

## 2012-07-20 NOTE — Telephone Encounter (Signed)
Pharmacy requesting refill of Hydrocodone APAP 5/325 1 q 4 to 6 hrs. Prn. For pain. Please advise

## 2012-07-20 NOTE — Telephone Encounter (Signed)
Unfortunately, this medication does not appear to be a chronic medication for him, and has not been prescribed recently  I can refill the tramadol if that is ok

## 2012-08-18 ENCOUNTER — Ambulatory Visit (INDEPENDENT_AMBULATORY_CARE_PROVIDER_SITE_OTHER)
Admission: RE | Admit: 2012-08-18 | Discharge: 2012-08-18 | Disposition: A | Discharge status: Home / Self Care | Payer: Medicare Other | Source: Ambulatory Visit | Attending: Internal Medicine | Admitting: Internal Medicine

## 2012-08-18 ENCOUNTER — Encounter: Payer: Self-pay | Admitting: Internal Medicine

## 2012-08-18 ENCOUNTER — Ambulatory Visit (INDEPENDENT_AMBULATORY_CARE_PROVIDER_SITE_OTHER): Payer: Medicare Other | Admitting: Internal Medicine

## 2012-08-18 ENCOUNTER — Ambulatory Visit: Payer: Medicare Other | Admitting: Internal Medicine

## 2012-08-18 ENCOUNTER — Other Ambulatory Visit (INDEPENDENT_AMBULATORY_CARE_PROVIDER_SITE_OTHER): Payer: Medicare Other

## 2012-08-18 VITALS — BP 132/68 | HR 67 | Temp 98.0°F | Ht 67.0 in | Wt 143.5 lb

## 2012-08-18 DIAGNOSIS — R05 Cough: Secondary | ICD-10-CM | POA: Diagnosis not present

## 2012-08-18 DIAGNOSIS — R490 Dysphonia: Secondary | ICD-10-CM

## 2012-08-18 DIAGNOSIS — N189 Chronic kidney disease, unspecified: Secondary | ICD-10-CM | POA: Diagnosis not present

## 2012-08-18 DIAGNOSIS — R197 Diarrhea, unspecified: Secondary | ICD-10-CM

## 2012-08-18 DIAGNOSIS — R059 Cough, unspecified: Secondary | ICD-10-CM

## 2012-08-18 LAB — HEPATIC FUNCTION PANEL
ALT: 17 U/L (ref 0–53)
Bilirubin, Direct: 0.2 mg/dL (ref 0.0–0.3)
Total Protein: 6.6 g/dL (ref 6.0–8.3)

## 2012-08-18 LAB — CBC WITH DIFFERENTIAL/PLATELET
Basophils Relative: 0.4 % (ref 0.0–3.0)
Eosinophils Relative: 1.8 % (ref 0.0–5.0)
Hemoglobin: 14.3 g/dL (ref 13.0–17.0)
Lymphocytes Relative: 29 % (ref 12.0–46.0)
MCHC: 33.5 g/dL (ref 30.0–36.0)
Monocytes Relative: 10.1 % (ref 3.0–12.0)
Neutro Abs: 4.9 10*3/uL (ref 1.4–7.7)
Neutrophils Relative %: 58.7 % (ref 43.0–77.0)
RBC: 4.36 Mil/uL (ref 4.22–5.81)
WBC: 8.4 10*3/uL (ref 4.5–10.5)

## 2012-08-18 LAB — BASIC METABOLIC PANEL
BUN: 33 mg/dL — ABNORMAL HIGH (ref 6–23)
CO2: 21 mEq/L (ref 19–32)
Calcium: 9 mg/dL (ref 8.4–10.5)
Chloride: 111 mEq/L (ref 96–112)
Creatinine, Ser: 1.6 mg/dL — ABNORMAL HIGH (ref 0.4–1.5)

## 2012-08-18 LAB — LIPASE: Lipase: 52 U/L (ref 11.0–59.0)

## 2012-08-18 NOTE — Assessment & Plan Note (Signed)
stable overall by hx and exam, most recent data reviewed with pt, and pt to continue medical treatment as before Lab Results  Component Value Date   CREATININE 1.6* 04/06/2012

## 2012-08-18 NOTE — Assessment & Plan Note (Signed)
Just started to improve today after ill in the past 2 wks, suspect viral illness, suggeseted he cont to monitor closely, and if does not resolve in the next 1-2 wks he should consider ENT referral; for cxr today as well with recent, note nonsmoker

## 2012-08-18 NOTE — Patient Instructions (Addendum)
Continue all other medications as before You can also take Mucinex (or it's generic off brand) for chest congestion if needed, or Immodium as needed for loose stools, and tylenol as needed for aches and pains Please drink more fluids in the next few days Please go to LAB in the Basement for the blood and/or urine tests to be done today Please go to XRAY in the Basement for the x-ray test

## 2012-08-18 NOTE — Assessment & Plan Note (Signed)
Overall hx seems c/w acute gastroenteritis, now improving, taking po well, no blood and afeb;  Encouraged more fluids, will check for worsening anemia, renal fxn, and wbc, consider GI if does not cont to improve

## 2012-08-18 NOTE — Progress Notes (Signed)
Subjective:    Patient ID: Nicholas Porter, male    DOB: Sep 13, 1930, 78 y.o.   MRN: [id number]  HPI  Here to f/u; 2 wks ago on a Thursday had onset of diarrhea, up all night one night at that time with mult watery stools, improved but still has loose stools daily since then, most food including dairy products seem to "go right through", also with hoarseness and ST as well;  No fever;  Had some nausea to start of illness but no vomiting and no further nausea; voice started getting better yesterday;  No blood or abd pain or distension;  Pt denies chest pain, increased sob or doe, wheezing, orthopnea, PND, increased LE swelling, palpitations,  Syncope. But has dropped some wt and mild dizziness, appetite ok.  Pt denies new neurological symptoms such as new headache, or facial or extremity weakness or numbness   Pt denies polydipsia, polyuria.  May have had some chest congestion and phlegm in the past wk. Does have some intermittent reflux in the past but as bad as sour brash type he had in yrs past.  Denies worsening reflux, dysphagia, abd pain, n/v, bowel change or blood.  Some mild cough as above, nonsmoker. Past Medical History  Diagnosis Date  . ANEMIA, B12 DEFICIENCY 11/04/2008  . ARTHRITIS, HX OF 06/30/2007  . BACK PAIN, LUMBAR 03/07/2009  . BACK PAIN, UPPER 12/05/2010  . 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 COLONIC POLYPS, HX OF 08/25/2008  . CORONARY ARTERY DISEASE 05/22/2008  . DIVERTICULITIS, COLON 12/08/2008  . GERD 06/30/2007  . HIATAL HERNIA 08/25/2008  . HYPERKERATOSIS 10/19/2009  . HYPERLIPIDEMIA 10/18/2008  . HYPERTENSION 06/30/2007  . HYPOTHYROIDISM 10/18/2008  . Irritable bowel syndrome 12/08/2008  . OSTEOARTHRITIS 05/22/2008  . OSTEOPENIA 10/18/2008  . PEPTIC ULCER DISEASE, HX OF 06/30/2007  . RENAL FAILURE 12/06/2009  . THROMBOCYTOPENIA NOS 06/30/2007  . UNS ADVRS EFF OTH RX MEDICINAL&BIOLOGICAL SBSTNC 10/18/2008  . VITAMIN B12 DEFICIENCY 09/02/2008  . Carotid artery disease   . Neck pain,  chronic   . CKD (chronic kidney disease) 12/06/2011  . 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 Gallstones   . COPD (chronic obstructive pulmonary disease) 12/07/2011  . Carotid stenosis 12/07/2011   Past Surgical History  Procedure Date  . Foot surgery     left  . Carpal tunnel release   . Hernia repair   . Total knee arthroplasty   . Carotid endarterectomy   . Amputation     left index finger -tramatic  . Vagotomy     partial gastrectomy  . Partial gastrectomy   . Corneal transplant   . Rotator cuff repair     left  . Spine surgery 1985    cervical laminectomy    reports that he quit smoking about 47 years ago. He has never used smokeless tobacco. He reports that he does not drink alcohol or use illicit drugs. family history is not on file. Allergies  Allergen Reactions  . Amlodipine Besylate Hives  . Naproxen Diarrhea  . Oxycodone-Acetaminophen Other (See Comments)    REACTION: unspecified   Current Outpatient Prescriptions on File Prior to Visit  Medication Sig Dispense Refill  . aspirin EC 81 MG tablet Take 81 mg by mouth every other day.        . brinzolamide (AZOPT) 1 % ophthalmic suspension Place 1 drop into the left eye every 12 (twelve) hours.       . methocarbamol (ROBAXIN) 500 MG tablet Take 500 mg by mouth every  6 (six) hours as needed. For spasms       . metoprolol (TOPROL-XL) 50 MG 24 hr tablet Take 1 tablet (50 mg total) by mouth daily.  90 tablet  3  . nitroGLYCERIN (NITROSTAT) 0.4 MG SL tablet Place 0.4 mg under the tongue every 5 (five) minutes as needed. For chest pain.      Marland Kitchen travoprost, benzalkonium, (TRAVATAN) 0.004 % ophthalmic solution Place 1 drop into both eyes at bedtime.         Review of Systems Review of Systems  Constitutional: Negative for diaphoresis.  HENT: Negative for drooling and tinnitus.   Eyes: Negative for photophobia and visual disturbance.  Respiratory: Negative for choking and stridor.   Gastrointestinal: Negative for  vomiting and blood in stool.  Genitourinary: Negative for hematuria and decreased urine volume.  Musculoskeletal: Negative for gait problem.  Skin: Negative for color change and wound.  Neurological: Negative for tremors and numbness.  Psychiatric/Behavioral: Negative for decreased concentration. The patient is not hyperactive.      Objective:   Physical Exam BP 132/68  Pulse 67  Temp 98 F (36.7 C) (Oral)  Ht 5\' 7"  (1.702 m)  Wt 143 lb 8 oz (65.091 kg)  BMI 22.48 kg/m2  SpO2 98% Physical Exam  VS noted, mild ill Constitutional: Pt appears well-developed and well-nourished.  HENT: Head: Normocephalic.  Right Ear: External ear normal.  Left Ear: External ear normal.  Bilat tm's mild erythema.  Sinus nontender.  Pharynx mild erythema Eyes: Conjunctivae and EOM are normal. Pupils are equal, round, and reactive to light.  Neck: Normal range of motion. Neck supple.  Cardiovascular: Normal rate and regular rhythm.   Pulmonary/Chest: Effort normal and breath sounds normal.  Abd:  Soft, NT, non-distended, + BS except for mild epigastric tender without guarding or rebound Neurological: Pt is alert. Not confused Skin: Skin is warm. No erythema. No rash No acute joint swelling Psychiatric: Pt behavior is normal. Thought content normal. 1+ nervous    Assessment & Plan:

## 2012-08-18 NOTE — Assessment & Plan Note (Signed)
Minor, but in the context of his illness, will check cxr,  to f/u any worsening symptoms or concerns

## 2012-08-19 ENCOUNTER — Other Ambulatory Visit: Payer: Self-pay | Admitting: Internal Medicine

## 2012-08-19 ENCOUNTER — Ambulatory Visit: Payer: Medicare Other | Admitting: Internal Medicine

## 2012-09-24 DIAGNOSIS — Z23 Encounter for immunization: Secondary | ICD-10-CM | POA: Diagnosis not present

## 2012-09-25 DIAGNOSIS — H4011X0 Primary open-angle glaucoma, stage unspecified: Secondary | ICD-10-CM | POA: Diagnosis not present

## 2012-10-06 ENCOUNTER — Ambulatory Visit: Payer: Medicare Other | Admitting: Internal Medicine

## 2012-10-06 ENCOUNTER — Ambulatory Visit (INDEPENDENT_AMBULATORY_CARE_PROVIDER_SITE_OTHER): Payer: Medicare Other | Admitting: Internal Medicine

## 2012-10-06 ENCOUNTER — Encounter: Payer: Self-pay | Admitting: Internal Medicine

## 2012-10-06 VITALS — BP 120/62 | HR 62 | Temp 98.4°F | Ht 67.0 in | Wt 146.5 lb

## 2012-10-06 DIAGNOSIS — E785 Hyperlipidemia, unspecified: Secondary | ICD-10-CM | POA: Diagnosis not present

## 2012-10-06 DIAGNOSIS — N189 Chronic kidney disease, unspecified: Secondary | ICD-10-CM | POA: Diagnosis not present

## 2012-10-06 DIAGNOSIS — H9190 Unspecified hearing loss, unspecified ear: Secondary | ICD-10-CM

## 2012-10-06 DIAGNOSIS — I10 Essential (primary) hypertension: Secondary | ICD-10-CM

## 2012-10-06 DIAGNOSIS — H9191 Unspecified hearing loss, right ear: Secondary | ICD-10-CM

## 2012-10-06 NOTE — Assessment & Plan Note (Signed)
Improved with irrigation,  to f/u any worsening symptoms or concerns  

## 2012-10-06 NOTE — Patient Instructions (Addendum)
Continue all other medications as before  - no changes today Please have the pharmacy call with any refills you may need. Please keep your appointments with your specialists as you have planned - you should consider establishing with the heart doctor taking over for Dr Rex Kras;  If this is not what you want to do, please call and you will be referred to establish with a [place] right ear was irrigated of wax today Please continue your efforts at being more active, low cholesterol diet, and weight control. No need for further blood work, and you are overall up to date with prevention today Please return in 6 months, or sooner if needed Please remember to sign up for My Chart at your earliest convenience, as this will be important to you in the future with finding out test results.

## 2012-10-06 NOTE — Assessment & Plan Note (Addendum)
stable overall by hx and exam, most recent data reviewed with pt, and pt to continue medical treatment as before BP Readings from Last 3 Encounters:  10/06/12 120/62  08/18/12 132/68  04/06/12 132/78   Lab Results  Component Value Date   WBC 8.4 08/18/2012   HGB 14.3 08/18/2012   HCT 42.5 08/18/2012   PLT 98.0* 08/18/2012   GLUCOSE 108* 08/18/2012   CHOL 111 04/06/2012   TRIG 50.0 04/06/2012   HDL 44.30 04/06/2012   LDLCALC 57 04/06/2012   ALT 17 08/18/2012   AST 25 08/18/2012   NA 139 08/18/2012   K 4.9 08/18/2012   CL 111 08/18/2012   CREATININE 1.6* 08/18/2012   BUN 33* 08/18/2012   CO2 21 08/18/2012   TSH 2.86 04/06/2012   PSA 5.66* 10/19/2009   INR 1.02 11/19/2010   HGBA1C 5.2 12/10/2011

## 2012-10-06 NOTE — Assessment & Plan Note (Signed)
stable overall by hx and exam, most recent data reviewed with pt, and pt to continue medical treatment as before Lab Results  Component Value Date   LDLCALC 57 04/06/2012

## 2012-10-06 NOTE — Assessment & Plan Note (Signed)
stable overall by hx and exam, most recent data reviewed with pt, and pt to continue medical treatment as before Lab Results  Component Value Date   CREATININE 1.6* 08/18/2012

## 2012-10-06 NOTE — Progress Notes (Signed)
Subjective:    Patient ID: Nicholas Porter, male    DOB: Feb 16, 1930, 78 y.o.   MRN: [id number]  HPI  Here to f/u; overall doing ok,  Pt denies chest pain, increased sob or doe, wheezing, orthopnea, PND, increased LE swelling, palpitations, dizziness or syncope.  Pt denies new neurological symptoms such as new headache, or facial or extremity weakness or numbness   Pt denies polydipsia, polyuria,  Pt states overall good compliance with meds, trying to follow lower cholesterol diet, wt overall stable but little exercise however. Overall gained about 5 lbs recently,  Not sure why he lost wt in the first place.  Had flu shot already this season.  Gets some runny nose with eating and singing, not sure why.  Notes today Sister has DM, and recent heart transplant.  Pt quit smoking 47 yrs ago.  Denies worsening depressive symptoms, suicidal ideation, or panic, though has ongoing anxiety.  Has ongoing arthritic complaints to the lower back and neck, overall stable, only has to use the pain med/tramadol every few days. Last labs aug 2013 with normal cbc, sed rate, and cr stable at 1.6.  No further diarrhea or hoarseness.  Was seeing Dr Little/card who has now retired. Has decreased hearing incidentally on the right today for the past wk. Past Medical History  Diagnosis Date  . ANEMIA, B12 DEFICIENCY 11/04/2008  . ARTHRITIS, HX OF 06/30/2007  . BACK PAIN, LUMBAR 03/07/2009  . BACK PAIN, UPPER 12/05/2010  . 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 COLONIC POLYPS, HX OF 08/25/2008  . CORONARY ARTERY DISEASE 05/22/2008  . DIVERTICULITIS, COLON 12/08/2008  . GERD 06/30/2007  . HIATAL HERNIA 08/25/2008  . HYPERKERATOSIS 10/19/2009  . HYPERLIPIDEMIA 10/18/2008  . HYPERTENSION 06/30/2007  . HYPOTHYROIDISM 10/18/2008  . Irritable bowel syndrome 12/08/2008  . OSTEOARTHRITIS 05/22/2008  . OSTEOPENIA 10/18/2008  . PEPTIC ULCER DISEASE, HX OF 06/30/2007  . RENAL FAILURE 12/06/2009  . THROMBOCYTOPENIA NOS 06/30/2007  . UNS ADVRS EFF OTH  RX MEDICINAL&BIOLOGICAL SBSTNC 10/18/2008  . VITAMIN B12 DEFICIENCY 09/02/2008  . Carotid artery disease   . Neck pain, chronic   . CKD (chronic kidney disease) 12/06/2011  . 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 Gallstones   . COPD (chronic obstructive pulmonary disease) 12/07/2011  . Carotid stenosis 12/07/2011   Past Surgical History  Procedure Date  . Foot surgery     left  . Carpal tunnel release   . Hernia repair   . Total knee arthroplasty   . Carotid endarterectomy   . Amputation     left index finger -tramatic  . Vagotomy     partial gastrectomy  . Partial gastrectomy   . Corneal transplant   . Rotator cuff repair     left  . Spine surgery 1985    cervical laminectomy    reports that he quit smoking about 47 years ago. He has never used smokeless tobacco. He reports that he does not drink alcohol or use illicit drugs. family history is not on file. Allergies  Allergen Reactions  . Amlodipine Besylate Hives  . Naproxen Diarrhea  . Oxycodone-Acetaminophen Other (See Comments)    REACTION: unspecified   Current Outpatient Prescriptions on File Prior to Visit  Medication Sig Dispense Refill  . aspirin EC 81 MG tablet Take 81 mg by mouth every other day.        . brinzolamide (AZOPT) 1 % ophthalmic suspension Place 1 drop into the left eye every 12 (twelve) hours.       Marland Kitchen  methocarbamol (ROBAXIN) 500 MG tablet Take 500 mg by mouth every 6 (six) hours as needed. For spasms       . metoprolol (TOPROL-XL) 50 MG 24 hr tablet Take 1 tablet (50 mg total) by mouth daily.  90 tablet  3  . nitroGLYCERIN (NITROSTAT) 0.4 MG SL tablet Place 0.4 mg under the tongue every 5 (five) minutes as needed. For chest pain.      . simvastatin (ZOCOR) 20 MG tablet TAKE 1 TABLET BY MOUTH ONCE DAILY  30 tablet  11  . travoprost, benzalkonium, (TRAVATAN) 0.004 % ophthalmic solution Place 1 drop into both eyes at bedtime.         Review of Systems  Constitutional: Negative for diaphoresis  and unexpected weight change.  HENT: Negative for tinnitus.   Eyes: Negative for photophobia and visual disturbance.  Respiratory: Negative for choking and stridor.   Gastrointestinal: Negative for vomiting and blood in stool.  Genitourinary: Negative for hematuria and decreased urine volume.  Musculoskeletal: Negative for gait problem.  Skin: Negative for color change and wound.  Neurological: Negative for tremors and numbness.  Psychiatric/Behavioral: Negative for decreased concentration. The patient is not hyperactive.       Objective:   Physical Exam BP 120/62  Pulse 62  Temp 98.4 F (36.9 C) (Oral)  Ht 5\' 7"  (1.702 m)  Wt 146 lb 8 oz (66.452 kg)  BMI 22.95 kg/m2  SpO2 97% Physical Exam  VS noted Constitutional: Pt appears well-developed and well-nourished.  HENT: Head: Normocephalic.  Right Ear: External ear normal.  Left Ear: External ear normal. right canal wax impaction cleared with irrigation Eyes: Conjunctivae and EOM are normal. Pupils are equal, round, and reactive to light.  Neck: Normal range of motion. Neck supple.  Cardiovascular: Normal rate and regular rhythm.   Pulmonary/Chest: Effort normal and breath sounds normal.  Abd:  Soft, NT, non-distended, + BS Neurological: Pt is alert. Not confused , no cognitive dysfunction Skin: Skin is warm. No erythema.  No active joint swelling Psychiatric: Pt behavior is normal. Thought content normal.     Assessment & Plan:

## 2012-10-12 DIAGNOSIS — H02059 Trichiasis without entropian unspecified eye, unspecified eyelid: Secondary | ICD-10-CM | POA: Diagnosis not present

## 2012-11-17 ENCOUNTER — Emergency Department (HOSPITAL_COMMUNITY): Payer: Medicare Other

## 2012-11-17 ENCOUNTER — Emergency Department (HOSPITAL_COMMUNITY)
Admission: EM | Admit: 2012-11-17 | Discharge: 2012-11-17 | Disposition: A | Discharge status: Home / Self Care | Payer: Medicare Other | Attending: Emergency Medicine | Admitting: Emergency Medicine

## 2012-11-17 ENCOUNTER — Other Ambulatory Visit: Payer: Self-pay | Admitting: Internal Medicine

## 2012-11-17 ENCOUNTER — Encounter (HOSPITAL_COMMUNITY): Payer: Self-pay

## 2012-11-17 DIAGNOSIS — J4489 Other specified chronic obstructive pulmonary disease: Secondary | ICD-10-CM | POA: Insufficient documentation

## 2012-11-17 DIAGNOSIS — Z8719 Personal history of other diseases of the digestive system: Secondary | ICD-10-CM | POA: Diagnosis not present

## 2012-11-17 DIAGNOSIS — Z79899 Other long term (current) drug therapy: Secondary | ICD-10-CM | POA: Insufficient documentation

## 2012-11-17 DIAGNOSIS — Z860100 Personal history of colon polyps, unspecified: Secondary | ICD-10-CM | POA: Insufficient documentation

## 2012-11-17 DIAGNOSIS — G8929 Other chronic pain: Secondary | ICD-10-CM | POA: Diagnosis not present

## 2012-11-17 DIAGNOSIS — I129 Hypertensive chronic kidney disease with stage 1 through stage 4 chronic kidney disease, or unspecified chronic kidney disease: Secondary | ICD-10-CM | POA: Insufficient documentation

## 2012-11-17 DIAGNOSIS — Z862 Personal history of diseases of the blood and blood-forming organs and certain disorders involving the immune mechanism: Secondary | ICD-10-CM | POA: Insufficient documentation

## 2012-11-17 DIAGNOSIS — I2510 Atherosclerotic heart disease of native coronary artery without angina pectoris: Secondary | ICD-10-CM | POA: Insufficient documentation

## 2012-11-17 DIAGNOSIS — Z8711 Personal history of peptic ulcer disease: Secondary | ICD-10-CM | POA: Insufficient documentation

## 2012-11-17 DIAGNOSIS — M545 Low back pain, unspecified: Secondary | ICD-10-CM | POA: Insufficient documentation

## 2012-11-17 DIAGNOSIS — Z8739 Personal history of other diseases of the musculoskeletal system and connective tissue: Secondary | ICD-10-CM | POA: Diagnosis not present

## 2012-11-17 DIAGNOSIS — N189 Chronic kidney disease, unspecified: Secondary | ICD-10-CM | POA: Diagnosis not present

## 2012-11-17 DIAGNOSIS — M47817 Spondylosis without myelopathy or radiculopathy, lumbosacral region: Secondary | ICD-10-CM | POA: Diagnosis not present

## 2012-11-17 DIAGNOSIS — R6889 Other general symptoms and signs: Secondary | ICD-10-CM | POA: Diagnosis not present

## 2012-11-17 DIAGNOSIS — E785 Hyperlipidemia, unspecified: Secondary | ICD-10-CM | POA: Diagnosis not present

## 2012-11-17 DIAGNOSIS — K902 Blind loop syndrome, not elsewhere classified: Secondary | ICD-10-CM | POA: Diagnosis not present

## 2012-11-17 DIAGNOSIS — Z87891 Personal history of nicotine dependence: Secondary | ICD-10-CM | POA: Insufficient documentation

## 2012-11-17 DIAGNOSIS — M5137 Other intervertebral disc degeneration, lumbosacral region: Secondary | ICD-10-CM | POA: Diagnosis not present

## 2012-11-17 DIAGNOSIS — I6529 Occlusion and stenosis of unspecified carotid artery: Secondary | ICD-10-CM | POA: Insufficient documentation

## 2012-11-17 DIAGNOSIS — J449 Chronic obstructive pulmonary disease, unspecified: Secondary | ICD-10-CM | POA: Diagnosis not present

## 2012-11-17 DIAGNOSIS — Z872 Personal history of diseases of the skin and subcutaneous tissue: Secondary | ICD-10-CM | POA: Diagnosis not present

## 2012-11-17 LAB — URINALYSIS, ROUTINE W REFLEX MICROSCOPIC
Glucose, UA: NEGATIVE mg/dL
Ketones, ur: NEGATIVE mg/dL
Nitrite: NEGATIVE
Specific Gravity, Urine: 1.023 (ref 1.005–1.030)
pH: 5 (ref 5.0–8.0)

## 2012-11-17 MED ORDER — HYDROCODONE-ACETAMINOPHEN 5-325 MG PO TABS
1.0000 | ORAL_TABLET | Freq: Once | ORAL | Status: AC
  Administered 2012-11-17: 1 via ORAL
  Filled 2012-11-17: qty 1

## 2012-11-17 MED ORDER — HYDROCODONE-ACETAMINOPHEN 5-325 MG PO TABS: 1.0000 | ORAL_TABLET | ORAL | Status: DC | PRN

## 2012-11-17 NOTE — ED Provider Notes (Signed)
History     CSN: [id number]  Arrival date & time 11/17/12  1955   First MD Initiated Contact with Patient 11/17/12 2032      Chief Complaint  Patient presents with  . Back Pain    (Consider location/radiation/quality/duration/timing/severity/associated sxs/prior treatment) HPI History provided by pt.   Pt w/ h/o chronic low back pain, attributed to arthritis, presents w/ acutely worsened, diffuse low back pain x 1 week.  Pain is typical but more severe.  No trauma.  Pain does not radiate.  No associated fever, abdominal pain, N/V, bowel dysfunction or urinary sx, w/ exception of hesitancy and retention.  Reports that since pain started, it takes approx 5 minutes to begin voiding and he doesn't feel like he is completely emptying.   Past Medical History  Diagnosis Date  . ANEMIA, B12 DEFICIENCY 11/04/2008  . ARTHRITIS, HX OF 06/30/2007  . BACK PAIN, LUMBAR 03/07/2009  . BACK PAIN, UPPER 12/05/2010  . 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 COLONIC POLYPS, HX OF 08/25/2008  . CORONARY ARTERY DISEASE 05/22/2008  . DIVERTICULITIS, COLON 12/08/2008  . GERD 06/30/2007  . HIATAL HERNIA 08/25/2008  . HYPERKERATOSIS 10/19/2009  . HYPERLIPIDEMIA 10/18/2008  . HYPERTENSION 06/30/2007  . HYPOTHYROIDISM 10/18/2008  . Irritable bowel syndrome 12/08/2008  . OSTEOARTHRITIS 05/22/2008  . OSTEOPENIA 10/18/2008  . PEPTIC ULCER DISEASE, HX OF 06/30/2007  . RENAL FAILURE 12/06/2009  . THROMBOCYTOPENIA NOS 06/30/2007  . UNS ADVRS EFF OTH RX MEDICINAL&BIOLOGICAL SBSTNC 10/18/2008  . VITAMIN B12 DEFICIENCY 09/02/2008  . Carotid artery disease   . Neck pain, chronic   . CKD (chronic kidney disease) 12/06/2011  . 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 Gallstones   . COPD (chronic obstructive pulmonary disease) 12/07/2011  . Carotid stenosis 12/07/2011    Past Surgical History  Procedure Date  . Foot surgery     left  . Carpal tunnel release   . Hernia repair   . Total knee arthroplasty   . Carotid  endarterectomy   . Amputation     left index finger -tramatic  . Vagotomy     partial gastrectomy  . Partial gastrectomy   . Corneal transplant   . Rotator cuff repair     left  . Spine surgery 1985    cervical laminectomy    History reviewed. No pertinent family history.  History  Substance Use Topics  . Smoking status: Former Smoker    Quit date: 12/23/1964  . Smokeless tobacco: Never Used  . Alcohol Use: No      Review of Systems  All other systems reviewed and are negative.    Allergies  Amlodipine besylate; Naproxen; and Oxycodone-acetaminophen  Home Medications   Current Outpatient Rx  Name  Route  Sig  Dispense  Refill  . ASPIRIN EC 81 MG PO TBEC   Oral   Take 81 mg by mouth every other day.           Marland Kitchen BRINZOLAMIDE 1 % OP SUSP   Left Eye   Place 1 drop into the left eye every 12 (twelve) hours.          Marland Kitchen METHOCARBAMOL 500 MG PO TABS   Oral   Take 500 mg by mouth every 6 (six) hours as needed. For spasms          . METOPROLOL SUCCINATE ER 50 MG PO TB24   Oral   Take 50 mg by mouth daily. Take with or immediately following a meal.         .  NITROGLYCERIN 0.4 MG SL SUBL   Sublingual   Place 0.4 mg under the tongue every 5 (five) minutes as needed. For chest pain.         Marland Kitchen SIMVASTATIN 20 MG PO TABS   Oral   Take 20 mg by mouth daily.         . TRAMADOL HCL 50 MG PO TABS   Oral   Take 50 mg by mouth every 6 (six) hours as needed. Pain         . TRAVOPROST 0.004 % OP SOLN   Both Eyes   Place 1 drop into both eyes at bedtime.             BP 179/67  Pulse 52  Temp 97.9 F (36.6 C) (Oral)  Resp 18  SpO2 100%  Physical Exam  Nursing note and vitals reviewed. Constitutional: He is oriented to person, place, and time. He appears well-developed and well-nourished.       Pt appears uncomfortable w/ movement  HENT:  Head: Normocephalic and atraumatic.  Eyes:       Normal appearance  Neck: Normal range of motion.    Cardiovascular: Normal rate and regular rhythm.   Pulmonary/Chest: Effort normal and breath sounds normal.  Abdominal: Soft. Bowel sounds are normal. He exhibits no distension. There is no tenderness.  Genitourinary:       No CVA ttp.  Nml rectal tone.  nml prostate.   Musculoskeletal:       Tenderness at approx T10-T12, mid-line and paraspinals. Full active ROM of LE.  Nml patellar reflexes.  No saddle anesthesia. Distal sensation intact.  2+ DP pulses.    Neurological: He is alert and oriented to person, place, and time.  Skin: Skin is warm and dry. No rash noted.  Psychiatric: He has a normal mood and affect. His behavior is normal.    ED Course  Procedures (including critical care time)  Labs Reviewed  URINALYSIS, ROUTINE W REFLEX MICROSCOPIC - Abnormal; Notable for the following:    APPearance CLOUDY (*)     Leukocytes, UA SMALL (*)     All other components within normal limits  URINE MICROSCOPIC-ADD ON   Dg Lumbar Spine Complete  11/17/2012  *RADIOLOGY REPORT*  Clinical Data: Low back pain.  LUMBAR SPINE - COMPLETE 4+ VIEW  Comparison: Lumbar spine radiograph 12/04/2011.  Findings: Five views of the lumbar spine demonstrate no definite acute displaced fractures or compression type fractures.  There is multilevel degenerative disc disease and facet arthropathy.  This results in 5 mm of retrolisthesis of L3 upon L4 and 3 mm of anterolisthesis of L4 upon L5, similar to the prior examination. Extensive vascular calcifications are noted.  No defects of the pars interarticularis are noted.  IMPRESSION: 1.  No acute radiographic abnormality of the lumbar spine. 2.  Extensive multilevel degenerative disc disease and lumbar spondylosis redemonstrated, as above.   Original Report Authenticated By: Vinnie Langton, M.D.      1. Low back pain       MDM  78yo M w/ chronic low back pain secondary to arthritis, presents w/ acute non-traumatic worsening of pain x 1 week.  Pain typical but  more severe and it is limiting his ability to ambulate.  Associated w/ urinary retention/hesitancy.  On exam, afebrile, mild tenderness at approx T10-T12, nml sensation, reflexes and strength of LEs and nml rectal tone and prostate.  U/A neg for infection and xray lumbar spine shows extensive degenerative changes and  spondylosis. Pt received one po vicodin for pain and reports no improvement in pain.  He is ambulatory but appears uncomfortable and reports that he gets electricity-like pain in right low back when he bears weight on RLE.  Discussed w/ Dr. Eulis Foster, he has examined patient and agrees that it would be appropriate to schedule outpatient MRI of lumbar spine.  MRI ordered and pt d/c'd home w/ vicodin and referral to NS.         Remer Macho, PA-C 11/18/12 [phone]

## 2012-11-17 NOTE — ED Notes (Signed)
PA at bedside.

## 2012-11-17 NOTE — ED Notes (Signed)
Per EMS: Pt from home. Pt has chronic lower back pain x 2 years that has gotten worse the past week. Robaxin taken for pain with no relief. Pain worse during movement and palpation.

## 2012-11-17 NOTE — ED Provider Notes (Signed)
Nicholas Porter is a 78 y.o. male was gradually worsening lower back pain, more right-sided, with some movement related, pain in associated urinary urgency, with slow voiding. He is able to urinate, by standing for several minutes at the commode. No fever, chills, nausea, or vomiting. No recent trauma to the back. On exam, he is ambulatory, in mild discomfort. He has increased pain in his right flank with movement of his right leg. Flexion of the lumbar spine causes back pain.  Evaluation is consistent with lumbar radiculopathy. He will be imaged for compressive myelopathy, as an outpatient within the next several days.    Medical screening examination/treatment/procedure(s) were conducted as a shared visit with non-physician practitioner(s) and myself.  I personally evaluated the patient during the encounter  Richarda Blade, MD 11/18/12 [phone]

## 2012-11-18 ENCOUNTER — Ambulatory Visit (HOSPITAL_COMMUNITY)
Admission: RE | Admit: 2012-11-18 | Discharge: 2012-11-18 | Disposition: A | Discharge status: Home / Self Care | Payer: Medicare Other | Source: Ambulatory Visit | Attending: Emergency Medicine | Admitting: Emergency Medicine

## 2012-11-18 DIAGNOSIS — M545 Low back pain, unspecified: Secondary | ICD-10-CM | POA: Diagnosis not present

## 2012-11-18 DIAGNOSIS — M79609 Pain in unspecified limb: Secondary | ICD-10-CM | POA: Diagnosis not present

## 2012-11-18 DIAGNOSIS — M47817 Spondylosis without myelopathy or radiculopathy, lumbosacral region: Secondary | ICD-10-CM | POA: Insufficient documentation

## 2012-11-18 DIAGNOSIS — M48061 Spinal stenosis, lumbar region without neurogenic claudication: Secondary | ICD-10-CM | POA: Diagnosis not present

## 2012-11-18 DIAGNOSIS — M5137 Other intervertebral disc degeneration, lumbosacral region: Secondary | ICD-10-CM | POA: Diagnosis not present

## 2012-11-20 ENCOUNTER — Inpatient Hospital Stay (HOSPITAL_COMMUNITY): Admission: RE | Admit: 2012-11-20 | Payer: Medicare Other | Source: Ambulatory Visit

## 2012-11-20 NOTE — ED Provider Notes (Signed)
Medical screening examination/treatment/procedure(s) were conducted as a shared visit with non-physician practitioner(s) and myself.  I personally evaluated the patient during the encounter  Patient has gradual onset of right low back pain radiating to the right leg. He also has urinary hesitancy. No fecal incontinence.  OP MRI ordered. He has access to follow up care to discuss the MRI results. I reviewed them on 11/20/12; no change in plans required.  Richarda Blade, MD 11/20/12 [phone]

## 2013-01-15 DIAGNOSIS — H4011X0 Primary open-angle glaucoma, stage unspecified: Secondary | ICD-10-CM | POA: Diagnosis not present

## 2013-01-19 ENCOUNTER — Telehealth: Payer: Self-pay | Admitting: Internal Medicine

## 2013-01-19 NOTE — Telephone Encounter (Signed)
Pharmacy changed to [facility].

## 2013-01-19 NOTE — Telephone Encounter (Signed)
Caller: Nicholas Porter/Patient; Phone: [phone]; Reason for Call: Switching Pharmacies.  Will now be using [facility] on [address]  [phone] for both himself and wife Nicholas Porter DOB: 06/07/1937 .  Please change in EPIC.  He has had presciptions transferred.

## 2013-02-16 ENCOUNTER — Other Ambulatory Visit: Payer: Self-pay | Admitting: Internal Medicine

## 2013-02-19 ENCOUNTER — Ambulatory Visit: Payer: Medicare Other | Admitting: Internal Medicine

## 2013-03-19 ENCOUNTER — Other Ambulatory Visit: Payer: Self-pay | Admitting: Internal Medicine

## 2013-04-07 ENCOUNTER — Encounter: Payer: Self-pay | Admitting: Internal Medicine

## 2013-04-07 ENCOUNTER — Other Ambulatory Visit (INDEPENDENT_AMBULATORY_CARE_PROVIDER_SITE_OTHER): Payer: Medicare Other

## 2013-04-07 ENCOUNTER — Ambulatory Visit (INDEPENDENT_AMBULATORY_CARE_PROVIDER_SITE_OTHER): Payer: Medicare Other | Admitting: Internal Medicine

## 2013-04-07 VITALS — BP 128/72 | HR 61 | Temp 97.5°F | Ht 67.0 in | Wt 145.1 lb

## 2013-04-07 DIAGNOSIS — E785 Hyperlipidemia, unspecified: Secondary | ICD-10-CM

## 2013-04-07 DIAGNOSIS — I10 Essential (primary) hypertension: Secondary | ICD-10-CM

## 2013-04-07 DIAGNOSIS — N189 Chronic kidney disease, unspecified: Secondary | ICD-10-CM

## 2013-04-07 LAB — LIPID PANEL
HDL: 39.3 mg/dL (ref >39.00)
LDL Cholesterol: 54 mg/dL (ref 0–99)
Total CHOL/HDL Ratio: 3
Triglycerides: 77 mg/dL (ref 0.0–149.0)
VLDL: 15.4 mg/dL (ref 0.0–40.0)

## 2013-04-07 LAB — URINALYSIS, ROUTINE W REFLEX MICROSCOPIC
Ketones, ur: NEGATIVE
Specific Gravity, Urine: 1.025 (ref 1.000–1.030)
Total Protein, Urine: NEGATIVE
Urine Glucose: NEGATIVE
Urobilinogen, UA: 0.2 (ref 0.0–1.0)

## 2013-04-07 LAB — CBC WITH DIFFERENTIAL/PLATELET
Basophils Absolute: 0 10*3/uL (ref 0.0–0.1)
Basophils Relative: 0.3 % (ref 0.0–3.0)
Eosinophils Absolute: 0.2 10*3/uL (ref 0.0–0.7)
HCT: 43.5 % (ref 39.0–52.0)
Hemoglobin: 15 g/dL (ref 13.0–17.0)
Lymphs Abs: 2.3 10*3/uL (ref 0.7–4.0)
MCHC: 34.4 g/dL (ref 30.0–36.0)
Monocytes Relative: 9.6 % (ref 3.0–12.0)
Neutro Abs: 5.3 10*3/uL (ref 1.4–7.7)
RBC: 4.35 Mil/uL (ref 4.22–5.81)
RDW: 13.6 % (ref 11.5–14.6)

## 2013-04-07 LAB — HEPATIC FUNCTION PANEL
Alkaline Phosphatase: 99 U/L (ref 39–117)
Bilirubin, Direct: 0.2 mg/dL (ref 0.0–0.3)

## 2013-04-07 LAB — BASIC METABOLIC PANEL
Calcium: 9.2 mg/dL (ref 8.4–10.5)
Creatinine, Ser: 1.5 mg/dL (ref 0.4–1.5)
GFR: 48.08 mL/min — ABNORMAL LOW (ref >60.00)
Sodium: 141 mEq/L (ref 135–145)

## 2013-04-07 NOTE — Assessment & Plan Note (Signed)
Volume stable, stable overall by history and exam, recent data reviewed with pt, and pt to continue medical treatment as before,  to f/u any worsening symptoms or concerns Lab Results  Component Value Date   WBC 8.6 04/07/2013   HGB 15.0 04/07/2013   HCT 43.5 04/07/2013   PLT 116.0* 04/07/2013   GLUCOSE 93 04/07/2013   CHOL 109 04/07/2013   TRIG 77.0 04/07/2013   HDL 39.30 04/07/2013   LDLCALC 54 04/07/2013   ALT 16 04/07/2013   AST 25 04/07/2013   NA 141 04/07/2013   K 5.0 04/07/2013   CL 113* 04/07/2013   CREATININE 1.5 04/07/2013   BUN 35* 04/07/2013   CO2 19 04/07/2013   TSH 3.02 04/07/2013   PSA 5.66* 10/19/2009   INR 1.02 11/19/2010   HGBA1C 5.2 12/10/2011   For f/u labs today

## 2013-04-07 NOTE — Assessment & Plan Note (Signed)
stable overall by history and exam, recent data reviewed with pt, and pt to continue medical treatment as before,  to f/u any worsening symptoms or concerns BP Readings from Last 3 Encounters:  04/07/13 128/72  11/17/12 175/62  10/06/12 120/62

## 2013-04-07 NOTE — Progress Notes (Signed)
Subjective:    Patient ID: Nicholas Porter, male    DOB: February 06, 1930, 79 y.o.   MRN: [id number]  HPI  Here to f/u; overall doing ok,  Pt denies chest pain, increased sob or doe, wheezing, orthopnea, PND, increased LE swelling, palpitations, dizziness or syncope, except for anterior chest pressure and sob with pulling the garbage can on trash day up a mild incline to the street, better with rest and lying on the bed; no other assoc symptoms such as diaphoresis or nausea. Saw Dr Little/card last fall with stress test - neg per pt.    Pt denies polydipsia, polyuria, or low sugar symptoms such as weakness or confusion improved with po intake.  Pt denies new neurological symptoms such as new headache, or facial or extremity weakness or numbness.   Pt states overall good compliance with meds, has been trying to follow lower cholesterol, diabetic diet, with wt overall stable,  but little exercise however. States staying active per pt, fortunately not had to use any pain medication for joint pain since last visit, no back pain worsening since lumbar MRI nov 2013 with lumbar DDD/DJD.  Had recent URI now essentially resolved.   Does have some vasomotor rhinitis like symptoms as well, with runny nose with eating, embarrassing at church functions. Past Medical History  Diagnosis Date  . ANEMIA, B12 DEFICIENCY 11/04/2008  . ARTHRITIS, HX OF 06/30/2007  . BACK PAIN, LUMBAR 03/07/2009  . BACK PAIN, UPPER 12/05/2010  . 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 COLONIC POLYPS, HX OF 08/25/2008  . CORONARY ARTERY DISEASE 05/22/2008  . DIVERTICULITIS, COLON 12/08/2008  . GERD 06/30/2007  . HIATAL HERNIA 08/25/2008  . HYPERKERATOSIS 10/19/2009  . HYPERLIPIDEMIA 10/18/2008  . HYPERTENSION 06/30/2007  . HYPOTHYROIDISM 10/18/2008  . Irritable bowel syndrome 12/08/2008  . OSTEOARTHRITIS 05/22/2008  . OSTEOPENIA 10/18/2008  . PEPTIC ULCER DISEASE, HX OF 06/30/2007  . RENAL FAILURE 12/06/2009  . THROMBOCYTOPENIA NOS 06/30/2007  . UNS ADVRS  EFF OTH RX MEDICINAL&BIOLOGICAL SBSTNC 10/18/2008  . VITAMIN B12 DEFICIENCY 09/02/2008  . Carotid artery disease   . Neck pain, chronic   . CKD (chronic kidney disease) 12/06/2011  . 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 Gallstones   . COPD (chronic obstructive pulmonary disease) 12/07/2011  . Carotid stenosis 12/07/2011   Past Surgical History  Procedure Laterality Date  . Foot surgery      left  . Carpal tunnel release    . Hernia repair    . Total knee arthroplasty    . Carotid endarterectomy    . Amputation      left index finger -tramatic  . Vagotomy      partial gastrectomy  . Partial gastrectomy    . Corneal transplant    . Rotator cuff repair      left  . Spine surgery  1985    cervical laminectomy    reports that he quit smoking about 48 years ago. He has never used smokeless tobacco. He reports that he does not drink alcohol or use illicit drugs. family history is not on file. Allergies  Allergen Reactions  . Amlodipine Besylate Hives  . Naproxen Diarrhea  . Oxycodone-Acetaminophen Other (See Comments)    REACTION: unspecified   Current Outpatient Prescriptions on File Prior to Visit  Medication Sig Dispense Refill  . metoprolol succinate (TOPROL-XL) 50 MG 24 hr tablet Take 50 mg by mouth daily. Take with or immediately following a meal.      . nitroGLYCERIN (NITROSTAT) 0.4 MG  SL tablet Place 0.4 mg under the tongue every 5 (five) minutes as needed. For chest pain.      . simvastatin (ZOCOR) 20 MG tablet TAKE 1 TABLET BY MOUTH ONCE DAILY  30 tablet  11  . aspirin EC 81 MG tablet Take 81 mg by mouth every other day.         No current facility-administered medications on file prior to visit.    Review of Systems  Constitutional: Negative for unexpected weight change, or unusual diaphoresis  HENT: Negative for tinnitus.   Eyes: Negative for photophobia and visual disturbance.  Respiratory: Negative for choking and stridor.   Gastrointestinal: Negative  for vomiting and blood in stool.  Genitourinary: Negative for hematuria and decreased urine volume.  Musculoskeletal: Negative for acute joint swelling Skin: Negative for color change and wound.  Neurological: Negative for tremors and numbness other than noted  Psychiatric/Behavioral: Negative for decreased concentration or  hyperactivity.       Objective:   Physical Exam BP 128/72  Pulse 61  Temp(Src) 97.5 F (36.4 C) (Oral)  Ht 5\' 7"  (1.702 m)  Wt 145 lb 2 oz (65.828 kg)  BMI 22.72 kg/m2  SpO2 97% VS noted,  Constitutional: Pt appears well-developed and well-nourished.  HENT: Head: NCAT.  Right Ear: External ear normal.  Left Ear: External ear normal.  Eyes: Conjunctivae and EOM are normal. Pupils are equal, round, and reactive to light.  Neck: Normal range of motion. Neck supple.  Cardiovascular: Normal rate and regular rhythm.   Pulmonary/Chest: Effort normal and breath sounds normal.  Abd:  Soft, NT, non-distended, + BS Neurological: Pt is alert. Not confused  Skin: Skin is warm. No erythema.  Psychiatric: Pt behavior is normal. Thought content normal.     Assessment & Plan:

## 2013-04-07 NOTE — Assessment & Plan Note (Signed)
stable overall by history and exam, recent data reviewed with pt, and pt to continue medical treatment as before,  to f/u any worsening symptoms or concerns Lab Results  Component Value Date   LDLCALC 54 04/07/2013

## 2013-04-07 NOTE — Patient Instructions (Signed)
Please continue all other medications as before Please have the pharmacy call with any other refills you may need. Please continue your efforts at being more active, low cholesterol diet, and weight control. You are otherwise up to date with prevention measures today. Please go to the LAB in the Basement (turn left off the elevator) for the tests to be done today You will be contacted by phone if any changes need to be made immediately.  Otherwise, you will receive a letter about your results with an explanation Please remember to sign up for My Chart if you have not done so, as this will be important to you in the future with finding out test results, communicating by private email, and scheduling acute appointments online when needed. Please return in 6 months, or sooner if needed

## 2013-04-19 DIAGNOSIS — H4011X0 Primary open-angle glaucoma, stage unspecified: Secondary | ICD-10-CM | POA: Diagnosis not present

## 2013-05-04 DIAGNOSIS — S43499A Other sprain of unspecified shoulder joint, initial encounter: Secondary | ICD-10-CM | POA: Diagnosis not present

## 2013-05-04 DIAGNOSIS — S46819A Strain of other muscles, fascia and tendons at shoulder and upper arm level, unspecified arm, initial encounter: Secondary | ICD-10-CM | POA: Diagnosis not present

## 2013-05-04 DIAGNOSIS — M25519 Pain in unspecified shoulder: Secondary | ICD-10-CM | POA: Diagnosis not present

## 2013-05-11 DIAGNOSIS — M25519 Pain in unspecified shoulder: Secondary | ICD-10-CM | POA: Diagnosis not present

## 2013-05-20 DIAGNOSIS — M75120 Complete rotator cuff tear or rupture of unspecified shoulder, not specified as traumatic: Secondary | ICD-10-CM | POA: Diagnosis not present

## 2013-06-09 ENCOUNTER — Telehealth: Payer: Self-pay | Admitting: Internal Medicine

## 2013-06-09 DIAGNOSIS — Z01810 Encounter for preprocedural cardiovascular examination: Secondary | ICD-10-CM

## 2013-06-09 DIAGNOSIS — M75120 Complete rotator cuff tear or rupture of unspecified shoulder, not specified as traumatic: Secondary | ICD-10-CM | POA: Diagnosis not present

## 2013-06-09 DIAGNOSIS — I2510 Atherosclerotic heart disease of native coronary artery without angina pectoris: Secondary | ICD-10-CM

## 2013-06-09 DIAGNOSIS — Z01818 Encounter for other preprocedural examination: Secondary | ICD-10-CM

## 2013-06-09 NOTE — Telephone Encounter (Signed)
I have received ask form for medical clearance per [facility] for knee procedure; pt is ok from medical standpoing, BUT I would recommend f/u given hx of CAD to see Dr Kennon Holter group/cardiology for further clearance  Robin to call pt - do I need to make referral?

## 2013-06-09 NOTE — Telephone Encounter (Signed)
Called the patient and he would like a referral as stated has only seen Dr. Gwenlyn Found once.

## 2013-06-09 NOTE — Telephone Encounter (Signed)
Referral done

## 2013-06-11 ENCOUNTER — Encounter: Payer: Self-pay | Admitting: Cardiology

## 2013-06-11 ENCOUNTER — Ambulatory Visit (INDEPENDENT_AMBULATORY_CARE_PROVIDER_SITE_OTHER): Payer: Medicare Other | Admitting: Cardiology

## 2013-06-11 VITALS — BP 128/60 | HR 56 | Ht 67.0 in | Wt 145.0 lb

## 2013-06-11 DIAGNOSIS — E785 Hyperlipidemia, unspecified: Secondary | ICD-10-CM

## 2013-06-11 DIAGNOSIS — I2510 Atherosclerotic heart disease of native coronary artery without angina pectoris: Secondary | ICD-10-CM | POA: Diagnosis not present

## 2013-06-11 DIAGNOSIS — I359 Nonrheumatic aortic valve disorder, unspecified: Secondary | ICD-10-CM | POA: Diagnosis not present

## 2013-06-11 DIAGNOSIS — I351 Nonrheumatic aortic (valve) insufficiency: Secondary | ICD-10-CM

## 2013-06-11 DIAGNOSIS — Z01818 Encounter for other preprocedural examination: Secondary | ICD-10-CM | POA: Diagnosis not present

## 2013-06-11 DIAGNOSIS — I10 Essential (primary) hypertension: Secondary | ICD-10-CM | POA: Diagnosis not present

## 2013-06-11 NOTE — Assessment & Plan Note (Signed)
Well-controlled on current medications -- metoprolol.

## 2013-06-11 NOTE — Assessment & Plan Note (Signed)
Is very stable cardiac standpoint. Cardiac risk evaluation as follows: PREOPERATIVE CARDIAC RISK ASSESSMENT   Revised Cardiac Risk Index:  High Risk Surgery: no; shoulder surgery  Defined as Intraperitoneal, intrathoracic or suprainguinal vascular  Active CAD symptoms: no;   CHF: no;   Cerebrovascular Disease: no;   Diabetes: no; On Insulin: no  CKD (Cr >~ 2): no; 1.5  Total: 0 Estimated Risk of Adverse Outcome: Very low risk  Estimated Risk of MI, PE, VF/VT (Cardiac Arrest), Complete Heart Block: Less than 1% %   ACC/AHA Guidelines for "Clearance":  Step 1 - Need for Emergency Surgery: No:   If Yes - go straight to OR with perioperative surveillance  Step 2 - Active Cardiac Conditions (Unstable Angina, Decompensated HF, Significant  Arrhytmias - Complete HB, Mobitz II, Symptomatic VT or SVT, Severe Aortic Stenosis - mean gradient > 40 mmHg, Valve area < 1.0 cm2):   No: Only my moderate AI  If Yes - Evaluate & Treat per ACC/AHA Guidelines  Step 3 -  Low Risk Surgery: Yes  If Yes --> proceed to OR  If No --> Step 4  Step 4 - Functional Capacity >= 4 METS without symptoms: Yes  If Yes --> proceed to OR  If No --> Step 5  Step 5 --  Clinical Risk Factors (CRF)   No significant CRFs: Yes, see above  If Yes --> Proceed to OR  Based on this evaluation, my recommendation would be to proceed to the OR without any further changes. He is on metoprolol which is cardioprotective. It is also noted the statin which was also protected. No further evaluation is required. However because of his aortic regurgitation will have an echocardiogram performed simply to ensure there is no exacerbation or worsening of this finding.

## 2013-06-11 NOTE — Patient Instructions (Addendum)
  We will see you back in follow up in 1 year  Your physician has requested that you have an echocardiogram. Echocardiography is a painless test that uses sound waves to create images of your heart. It provides your doctor with information about the size and shape of your heart and how well your heart's chambers and valves are working. This procedure takes approximately one hour. There are no restrictions for this procedure.

## 2013-06-11 NOTE — Assessment & Plan Note (Signed)
Not heard on exam, but due for a followup visit in 1-2 years since his last echo. We'll check a routine followup echocardiogram now and probably due in 2 years if no changes. Moving it is done prior to his preoperative evaluation by surgery.

## 2013-06-11 NOTE — Assessment & Plan Note (Signed)
No active symptoms. Is on beta blocker aspirin and statin. He is doing very well with negative evaluations from catheterization and nuclear stress test in last 5 years. No need to further evaluate.

## 2013-06-11 NOTE — Progress Notes (Addendum)
Patient ID: Nicholas Porter, male   DOB: 02-07-1930, 79 y.o.   MRN: [id number]  Clinic Note: HPI: Nicholas Porter is a 79 y.o. male with a PMH below who presents today for a routine followup of his moderate coronary disease, aortic insufficiency and also for preoperative risk assessment for a upcoming right shoulder surgery. He was for long-term patient of Dr. Dr. Eddie Dibbles. I actually saw him and performed a cardiac catheterization in 2011 we had some moderate lesions of 40-50% in the LAD 60-70% in the ostium and OM 1 that was no change from previous and then some 30-50% lesions in the RCA. All were relatively stable from previous catheterization. Plan was to continue medical management. Since that time his had one episode of discomfort in his chest associated with pulling and garbage can up a hill. We treated actually with a pulse dose of prednisone and his symptoms improved. He did undergo nuclear stress test and echocardiogram November 2012. Stress test was negative for ischemia. The echo was most notable for moderate aortic insufficiency. Otherwise normal ejection fraction with mild to moderate pulmonary hypertension.  Interval History: He now comes in doing quite well. Continues to be a low his entire 02 which are lot to push the or without any difficulties. He is out about doing it he wants to do it has no symptoms of chest pain or shortness of breath rash insertion. Maybe gets a little bit short of breath with walking up a hill now but at the LES stop and catch his breath. He denies any palpitations, lightheadedness, dizziness, weakness or 60/near-syncope. No TIA, amaurosis fugax Ms. No melena, hematochezia or hematuria.  He has upcoming right shoulder to surgery plan to see her for preoperative evaluation. Basically what happened is he is lumbar out of the shed and then walking this to do something and then backed out so walking out and tripped over long L. shoulder. That's reactivities of his  arthritic pains in his shoulders and back the tip the radiate around to the front but they're not nearly the significance' ago.   Past Medical History  Diagnosis Date  . ANEMIA, B12 DEFICIENCY 11/04/2008  . ARTHRITIS, HX OF 06/30/2007  . BACK PAIN, LUMBAR 03/07/2009  . BACK PAIN, UPPER 12/05/2010  . 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 COLONIC POLYPS, HX OF 08/25/2008  . CORONARY ARTERY DISEASE 05/22/2008  . DIVERTICULITIS, COLON 12/08/2008  . GERD 06/30/2007  . HIATAL HERNIA 08/25/2008  . HYPERKERATOSIS 10/19/2009  . HYPERLIPIDEMIA 10/18/2008  . HYPERTENSION 06/30/2007  . HYPOTHYROIDISM 10/18/2008  . Irritable bowel syndrome 12/08/2008  . OSTEOARTHRITIS 05/22/2008  . OSTEOPENIA 10/18/2008  . PEPTIC ULCER DISEASE, HX OF 06/30/2007  . RENAL FAILURE 12/06/2009  . THROMBOCYTOPENIA NOS 06/30/2007  . UNS ADVRS EFF OTH RX MEDICINAL&BIOLOGICAL SBSTNC 10/18/2008  . VITAMIN B12 DEFICIENCY 09/02/2008  . Carotid artery disease 2001    Relook cath in November 2011: 6-70% ostial circumflex OM lesion, 40-50% lesions in the LAD and RCA. Similar to 2001 catheterization.; Persantine Myoview October 2012: Lower scan no ischemia or infarction. EF 66%.  . Neck pain, chronic   . CKD (chronic kidney disease) 12/06/2011  . 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 Gallstones   . COPD (chronic obstructive pulmonary disease) 12/07/2011  . Carotid stenosis 12/07/2011  . Moderate aortic insufficiency February 2012    Echocardiogram: If greater detail percent with impaired relaxation. Mild/moderate mitral calcification with mild MR; moderate aortic regurgitation and mild sclerosis.    Prior Cardiac Evaluation and  Past Surgical History: Past Surgical History  Procedure Laterality Date  . Foot surgery      left  . Carpal tunnel release    . Hernia repair    . Total knee arthroplasty    . Carotid endarterectomy    . Amputation      left index finger -tramatic  . Vagotomy      partial gastrectomy  . Partial  gastrectomy    . Corneal transplant    . Rotator cuff repair      left  . Spine surgery  1985    cervical laminectomy  . Cardiac catheterization  November 2011    40% mid LAD, 50% proximal RCA, 30-40% distal RCA, 60-70% ostial OM1. No change from 2001. Medical therapy    Allergies  Allergen Reactions  . Amlodipine Besylate Hives  . Naproxen Diarrhea  . Oxycodone-Acetaminophen Other (See Comments)    REACTION: unspecified    also reported thrombocytopenia on high-dose aspirin  Current Outpatient Prescriptions  Medication Sig Dispense Refill  . AZOPT 1 % ophthalmic suspension       . HYDROcodone-acetaminophen (NORCO/VICODIN) 5-325 MG per tablet Take 1 tablet by mouth every 6 (six) hours as needed for pain.      . methocarbamol (ROBAXIN) 500 MG tablet Take 500 mg by mouth as needed.      . metoprolol succinate (TOPROL-XL) 50 MG 24 hr tablet Take 50 mg by mouth daily. Take with or immediately following a meal.      . nitroGLYCERIN (NITROSTAT) 0.4 MG SL tablet Place 0.4 mg under the tongue every 5 (five) minutes as needed. For chest pain.      . simvastatin (ZOCOR) 20 MG tablet TAKE 1 TABLET BY MOUTH ONCE DAILY  30 tablet  11  . TRAVATAN Z 0.004 % SOLN ophthalmic solution        No current facility-administered medications for this visit.    History   Social History  . Marital Status: Married    Spouse Name: N/A    Number of Children: N/A  . Years of Education: N/A   Occupational History  .      Retired   Social History Main Topics  . Smoking status: Former Smoker    Types: Cigarettes    Quit date: 12/23/1964  . Smokeless tobacco: Never Used  . Alcohol Use: No  . Drug Use: No  . Sexually Active: Not on file   Other Topics Concern  . Not on file   Social History Narrative   Very active at home, can walk several miles without discomfort. He also uses a push mower to mow his entire couple at her lawn.    ROS: A comprehensive Review of Systems - Negative except  Pertinent positives noted above mostly, he is several scabs and scars on the arms from falls related to tripping and not be tension, not due to balance and gait disorder. Respiratory ROS: no cough, shortness of breath, or wheezing positive for - A bit short of breath walking up a hill, or if he really pushes it Musculoskeletal ROS: positive for - Bilateral right greater than left shoulder pain with decreased oral motility. His discomfort and also radiates around the chest from both shoulders actually relatively stable but exacerbated by his recent fall.  PHYSICAL EXAM BP 128/60  Pulse 56  Ht 5\' 7"  (1.702 m)  Wt 145 lb (65.772 kg)  BMI 22.71 kg/m2 General appearance: alert, cooperative, appears stated age, no distress and Very pleasant,  normal mood and affect. Well nourished, well-groomed. Neck: no adenopathy, no carotid bruit, no JVD and supple, symmetrical, trachea midline Lungs: clear to auscultation bilaterally, normal percussion bilaterally and Nonlabored, good movement Heart: regular rate and rhythm, S1, S2 normal, no S3 or S4, systolic murmur: holosystolic 1/6, blowing at apex, no rub and Unable to auscultate any diastolic murmur or systolic ejection murmur Abdomen: soft, non-tender; bowel sounds normal; no masses,  no organomegaly Extremities: extremities normal, atraumatic, no cyanosis or edema and Decreased range of motion and tenderness with movement of the right shoulder Pulses: 2+ and symmetric Neurologic: Grossly normal HEENT: [place]/at, EOMI, MMM, anicteric sclera.  [id number] today: Yes Rate: 56 , Rhythm: Sinus bradycardia, LVH, possible anteroseptal infarct, age undetermined. No significant change from last ECG.  Recent Labs: April 14: Total cholesterol 109, triglycerides 77, HDL 39, LDL 54. LFTs normal. Cr 1.5.  ASSESSMENT: Overall stable cardiac standpoint. No active symptoms of heart failure or angina. He has moderate coronary disease but has been well treated medically  with no active symptoms. He is due for routine followup of his aortic insufficiency with echocardiogram. This should not have any bearing on surgery we will proceed with having it done prior to his operation, simply to ensure that this is stable.   Pre-op evaluation - Plan: EKG 12-Lead, 2D Echocardiogram without contrast  Aortic regurgitation - Plan: 2D Echocardiogram without contrast  HYPERTENSION  HYPERLIPIDEMIA  CORONARY ARTERY DISEASE  PLAN: Per problem list. Orders Placed This Encounter  Procedures  . EKG 12-Lead    Order Specific Question:  Where should this test be performed    Answer:  OTHER  . 2D Echocardiogram without contrast    Standing Status: Future     Number of Occurrences:      Standing Expiration Date: 06/11/2014    Order Specific Question:  Type of Echo    Answer:  Complete    Order Specific Question:  Reason for Exam    Answer:  aortic regurgitation    Order Specific Question:  Where should this test be performed    Answer:  [facility]    Followup: One year  HARDING,DAVID W, M.D., M.S. [facility] [address] [place], [place]  [address]  [phone] Pager # [phone] 06/11/2013 12:27 PM    ADDENDUM: Echocardiogram reviewed.  No change in mild to moderate AI.  Normal EF. OK to proceed to OR with no further cardiac evaluation.  Leonie Man, M.D., M.S. [facility] [address] [place], [place]  [address]  [phone] Pager # [phone] 06/16/2013 7:59 PM

## 2013-06-11 NOTE — Assessment & Plan Note (Signed)
Very well controlled on statin. See results above.

## 2013-06-15 ENCOUNTER — Ambulatory Visit (HOSPITAL_COMMUNITY)
Admission: RE | Admit: 2013-06-15 | Discharge: 2013-06-15 | Disposition: A | Discharge status: Home / Self Care | Payer: Medicare Other | Source: Ambulatory Visit | Attending: Cardiology | Admitting: Cardiology

## 2013-06-15 DIAGNOSIS — Z01818 Encounter for other preprocedural examination: Secondary | ICD-10-CM

## 2013-06-15 DIAGNOSIS — I359 Nonrheumatic aortic valve disorder, unspecified: Secondary | ICD-10-CM

## 2013-06-15 DIAGNOSIS — I10 Essential (primary) hypertension: Secondary | ICD-10-CM | POA: Diagnosis not present

## 2013-06-15 DIAGNOSIS — I2510 Atherosclerotic heart disease of native coronary artery without angina pectoris: Secondary | ICD-10-CM | POA: Insufficient documentation

## 2013-06-15 DIAGNOSIS — Z01810 Encounter for preprocedural cardiovascular examination: Secondary | ICD-10-CM

## 2013-06-15 DIAGNOSIS — E785 Hyperlipidemia, unspecified: Secondary | ICD-10-CM | POA: Diagnosis not present

## 2013-06-15 DIAGNOSIS — I351 Nonrheumatic aortic (valve) insufficiency: Secondary | ICD-10-CM

## 2013-06-15 NOTE — Progress Notes (Signed)
2D Echo Performed 06/15/2013    Marygrace Drought, RCS

## 2013-06-16 ENCOUNTER — Telehealth: Payer: Self-pay | Admitting: *Deleted

## 2013-06-16 ENCOUNTER — Ambulatory Visit: Payer: Medicare Other | Admitting: Internal Medicine

## 2013-06-16 NOTE — Telephone Encounter (Signed)
Message copied by Raiford Simmonds on Wed Jun 16, 2013  6:25 PM ------      Message from: [facility], DAVID      Created: Tue Jun 15, 2013  3:04 PM       Pretty normal study; normal function, normal valves.                    Leonie Man, MD       ------

## 2013-06-16 NOTE — Telephone Encounter (Signed)
Result given. Pt wanted to know if he was cleared for surgery by Dr Onnie Graham. Will defer to Dr  Ellyn Hack. Will call patient back with answer.

## 2013-06-17 ENCOUNTER — Telehealth: Payer: Self-pay | Admitting: *Deleted

## 2013-06-17 ENCOUNTER — Encounter: Payer: Self-pay | Admitting: *Deleted

## 2013-06-17 NOTE — Telephone Encounter (Signed)
Left message that he clearance and Dr Susie Cassette office is aware.

## 2013-06-28 ENCOUNTER — Encounter (HOSPITAL_COMMUNITY): Payer: Self-pay | Admitting: Respiratory Therapy

## 2013-06-30 ENCOUNTER — Encounter (HOSPITAL_COMMUNITY): Payer: Self-pay | Admitting: *Deleted

## 2013-06-30 MED ORDER — DEXTROSE 5 % IV SOLN
3.0000 g | INTRAVENOUS | Status: AC
  Administered 2013-07-01: 3 g via INTRAVENOUS
  Filled 2013-06-30: qty 3000

## 2013-06-30 NOTE — Progress Notes (Signed)
Pt denies chest pain but admits to having SOB on exertion. According to pt, " I had a stress test over 5 years ago and a cardiac cath 9 years ago." Pt states that these cardiac tests were done at [facility] while under the care of Dr. Rex Kras. Pt is currently under the care of Dr. Johny Chess ( cardiologist) at [facility].

## 2013-07-01 ENCOUNTER — Encounter (HOSPITAL_COMMUNITY): Payer: Self-pay | Admitting: *Deleted

## 2013-07-01 ENCOUNTER — Encounter (HOSPITAL_COMMUNITY): Payer: Self-pay | Admitting: Vascular Surgery

## 2013-07-01 ENCOUNTER — Ambulatory Visit (HOSPITAL_COMMUNITY)
Admission: RE | Admit: 2013-07-01 | Discharge: 2013-07-01 | Disposition: A | Discharge status: Home / Self Care | Payer: Medicare Other | Source: Ambulatory Visit | Attending: Orthopedic Surgery | Admitting: Orthopedic Surgery

## 2013-07-01 ENCOUNTER — Encounter (HOSPITAL_COMMUNITY)
Admission: RE | Disposition: A | Discharge status: Home / Self Care | Payer: Self-pay | Source: Ambulatory Visit | Attending: Orthopedic Surgery

## 2013-07-01 ENCOUNTER — Ambulatory Visit (HOSPITAL_COMMUNITY): Payer: Medicare Other | Admitting: Vascular Surgery

## 2013-07-01 DIAGNOSIS — M25819 Other specified joint disorders, unspecified shoulder: Secondary | ICD-10-CM | POA: Insufficient documentation

## 2013-07-01 DIAGNOSIS — M19019 Primary osteoarthritis, unspecified shoulder: Secondary | ICD-10-CM | POA: Diagnosis not present

## 2013-07-01 DIAGNOSIS — I6529 Occlusion and stenosis of unspecified carotid artery: Secondary | ICD-10-CM | POA: Diagnosis not present

## 2013-07-01 DIAGNOSIS — M899 Disorder of bone, unspecified: Secondary | ICD-10-CM | POA: Diagnosis not present

## 2013-07-01 DIAGNOSIS — J449 Chronic obstructive pulmonary disease, unspecified: Secondary | ICD-10-CM | POA: Insufficient documentation

## 2013-07-01 DIAGNOSIS — I359 Nonrheumatic aortic valve disorder, unspecified: Secondary | ICD-10-CM | POA: Diagnosis not present

## 2013-07-01 DIAGNOSIS — S46819A Strain of other muscles, fascia and tendons at shoulder and upper arm level, unspecified arm, initial encounter: Secondary | ICD-10-CM | POA: Diagnosis not present

## 2013-07-01 DIAGNOSIS — E538 Deficiency of other specified B group vitamins: Secondary | ICD-10-CM | POA: Insufficient documentation

## 2013-07-01 DIAGNOSIS — D696 Thrombocytopenia, unspecified: Secondary | ICD-10-CM | POA: Insufficient documentation

## 2013-07-01 DIAGNOSIS — I2510 Atherosclerotic heart disease of native coronary artery without angina pectoris: Secondary | ICD-10-CM | POA: Insufficient documentation

## 2013-07-01 DIAGNOSIS — K219 Gastro-esophageal reflux disease without esophagitis: Secondary | ICD-10-CM | POA: Diagnosis not present

## 2013-07-01 DIAGNOSIS — M129 Arthropathy, unspecified: Secondary | ICD-10-CM | POA: Diagnosis not present

## 2013-07-01 DIAGNOSIS — I129 Hypertensive chronic kidney disease with stage 1 through stage 4 chronic kidney disease, or unspecified chronic kidney disease: Secondary | ICD-10-CM | POA: Insufficient documentation

## 2013-07-01 DIAGNOSIS — S43499A Other sprain of unspecified shoulder joint, initial encounter: Secondary | ICD-10-CM | POA: Diagnosis not present

## 2013-07-01 DIAGNOSIS — M719 Bursopathy, unspecified: Secondary | ICD-10-CM | POA: Insufficient documentation

## 2013-07-01 DIAGNOSIS — M75110 Incomplete rotator cuff tear or rupture of unspecified shoulder, not specified as traumatic: Secondary | ICD-10-CM | POA: Diagnosis not present

## 2013-07-01 DIAGNOSIS — K902 Blind loop syndrome, not elsewhere classified: Secondary | ICD-10-CM | POA: Diagnosis not present

## 2013-07-01 DIAGNOSIS — M67919 Unspecified disorder of synovium and tendon, unspecified shoulder: Secondary | ICD-10-CM | POA: Diagnosis not present

## 2013-07-01 DIAGNOSIS — N189 Chronic kidney disease, unspecified: Secondary | ICD-10-CM | POA: Diagnosis not present

## 2013-07-01 DIAGNOSIS — Z87891 Personal history of nicotine dependence: Secondary | ICD-10-CM | POA: Insufficient documentation

## 2013-07-01 DIAGNOSIS — M249 Joint derangement, unspecified: Secondary | ICD-10-CM | POA: Insufficient documentation

## 2013-07-01 DIAGNOSIS — D649 Anemia, unspecified: Secondary | ICD-10-CM | POA: Diagnosis not present

## 2013-07-01 DIAGNOSIS — E039 Hypothyroidism, unspecified: Secondary | ICD-10-CM | POA: Insufficient documentation

## 2013-07-01 DIAGNOSIS — K449 Diaphragmatic hernia without obstruction or gangrene: Secondary | ICD-10-CM | POA: Insufficient documentation

## 2013-07-01 DIAGNOSIS — E785 Hyperlipidemia, unspecified: Secondary | ICD-10-CM | POA: Diagnosis not present

## 2013-07-01 DIAGNOSIS — G8918 Other acute postprocedural pain: Secondary | ICD-10-CM | POA: Diagnosis not present

## 2013-07-01 DIAGNOSIS — J4489 Other specified chronic obstructive pulmonary disease: Secondary | ICD-10-CM | POA: Insufficient documentation

## 2013-07-01 HISTORY — PX: SHOULDER ARTHROSCOPY WITH ROTATOR CUFF REPAIR AND SUBACROMIAL DECOMPRESSION: SHX5686

## 2013-07-01 LAB — CBC
HCT: 36.3 % — ABNORMAL LOW (ref 39.0–52.0)
Hemoglobin: 13.1 g/dL (ref 13.0–17.0)
RBC: 3.82 MIL/uL — ABNORMAL LOW (ref 4.22–5.81)
WBC: 7.3 10*3/uL (ref 4.0–10.5)

## 2013-07-01 LAB — BASIC METABOLIC PANEL
BUN: 37 mg/dL — ABNORMAL HIGH (ref 6–23)
Chloride: 111 mEq/L (ref 96–112)
GFR calc Af Amer: 43 mL/min — ABNORMAL LOW (ref >90)
Potassium: 4.9 mEq/L (ref 3.5–5.1)

## 2013-07-01 SURGERY — SHOULDER ARTHROSCOPY WITH ROTATOR CUFF REPAIR AND SUBACROMIAL DECOMPRESSION
Anesthesia: General | Site: Shoulder | Laterality: Right | Wound class: Clean

## 2013-07-01 MED ORDER — ONDANSETRON HCL 4 MG/2ML IJ SOLN: 4.0000 mg | Freq: Once | INTRAMUSCULAR | Status: DC | PRN

## 2013-07-01 MED ORDER — NEOSTIGMINE METHYLSULFATE 1 MG/ML IJ SOLN
INTRAMUSCULAR | Status: DC | PRN
  Administered 2013-07-01: 4 mg via INTRAVENOUS

## 2013-07-01 MED ORDER — FENTANYL CITRATE 0.05 MG/ML IJ SOLN
INTRAMUSCULAR | Status: AC
  Administered 2013-07-01: 100 ug via INTRAVENOUS
  Filled 2013-07-01: qty 2

## 2013-07-01 MED ORDER — ONDANSETRON HCL 4 MG/2ML IJ SOLN
INTRAMUSCULAR | Status: DC | PRN
  Administered 2013-07-01: 4 mg via INTRAVENOUS

## 2013-07-01 MED ORDER — METHOCARBAMOL 500 MG PO TABS: 500.0000 mg | ORAL_TABLET | Freq: Three times a day (TID) | ORAL | Status: DC | PRN

## 2013-07-01 MED ORDER — HYDROMORPHONE HCL 2 MG PO TABS: 2.0000 mg | ORAL_TABLET | ORAL | Status: DC | PRN

## 2013-07-01 MED ORDER — MIDAZOLAM HCL 2 MG/2ML IJ SOLN
INTRAMUSCULAR | Status: AC
  Administered 2013-07-01: 1 mg via INTRAVENOUS
  Filled 2013-07-01: qty 2

## 2013-07-01 MED ORDER — LACTATED RINGERS IV SOLN
INTRAVENOUS | Status: DC | PRN
  Administered 2013-07-01: 13:00:00 via INTRAVENOUS

## 2013-07-01 MED ORDER — FENTANYL CITRATE 0.05 MG/ML IJ SOLN: 50.0000 ug | INTRAMUSCULAR | Status: DC | PRN

## 2013-07-01 MED ORDER — LACTATED RINGERS IV SOLN
INTRAVENOUS | Status: DC
  Administered 2013-07-01: 20 mL/h via INTRAVENOUS

## 2013-07-01 MED ORDER — PROPOFOL 10 MG/ML IV BOLUS
INTRAVENOUS | Status: DC | PRN
  Administered 2013-07-01: 90 mg via INTRAVENOUS

## 2013-07-01 MED ORDER — ROCURONIUM BROMIDE 100 MG/10ML IV SOLN
INTRAVENOUS | Status: DC | PRN
  Administered 2013-07-01: 40 mg via INTRAVENOUS

## 2013-07-01 MED ORDER — FENTANYL CITRATE 0.05 MG/ML IJ SOLN
INTRAMUSCULAR | Status: DC | PRN
  Administered 2013-07-01: 50 ug via INTRAVENOUS

## 2013-07-01 MED ORDER — GLYCOPYRROLATE 0.2 MG/ML IJ SOLN
INTRAMUSCULAR | Status: DC | PRN
  Administered 2013-07-01: 0.6 mg via INTRAVENOUS
  Administered 2013-07-01 (×2): 0.1 mg via INTRAVENOUS
  Administered 2013-07-01: 0.2 mg via INTRAVENOUS

## 2013-07-01 MED ORDER — HYDROMORPHONE HCL PF 1 MG/ML IJ SOLN: 0.2500 mg | INTRAMUSCULAR | Status: DC | PRN

## 2013-07-01 MED ORDER — TEMAZEPAM 15 MG PO CAPS: 15.0000 mg | ORAL_CAPSULE | Freq: Every evening | ORAL | Status: DC | PRN

## 2013-07-01 MED ORDER — MIDAZOLAM HCL 2 MG/2ML IJ SOLN: 1.0000 mg | INTRAMUSCULAR | Status: DC | PRN

## 2013-07-01 MED ORDER — ROPIVACAINE HCL 5 MG/ML IJ SOLN
INTRAMUSCULAR | Status: DC | PRN
  Administered 2013-07-01: 150 mg

## 2013-07-01 MED ORDER — SODIUM CHLORIDE 0.9 % IR SOLN
Status: DC | PRN
  Administered 2013-07-01: 6000 mL

## 2013-07-01 MED ORDER — LIDOCAINE HCL 4 % MT SOLN
OROMUCOSAL | Status: DC | PRN
  Administered 2013-07-01: 2 mL via TOPICAL

## 2013-07-01 MED ORDER — CHLORHEXIDINE GLUCONATE 4 % EX LIQD: 60.0000 mL | Freq: Once | CUTANEOUS | Status: DC

## 2013-07-01 SURGICAL SUPPLY — 62 items
ANCH SUT 2 FT CRKSW 14.7X5.5 (Anchor) ×1 IMPLANT
ANCH SUT SWLK 19.1X4.75 VT (Anchor) ×2 IMPLANT
ANCHOR CORKSCREW FIBER 5.5X15 (Anchor) ×1 IMPLANT
ANCHOR PEEK 4.75X19.1 SWLK C (Anchor) ×2 IMPLANT
BLADE CUTTER GATOR 3.5 (BLADE) ×2 IMPLANT
BLADE GREAT WHITE 4.2 (BLADE) ×2 IMPLANT
BLADE SURG 11 STRL SS (BLADE) ×2 IMPLANT
BOOTCOVER CLEANROOM LRG (PROTECTIVE WEAR) ×4 IMPLANT
BUR 3.5 LG SPHERICAL (BURR) IMPLANT
BUR OVAL 4.0 (BURR) ×2 IMPLANT
BURR 3.5 LG SPHERICAL (BURR) ×2
CANISTER SUCT LVC 12 LTR MEDI- (MISCELLANEOUS) ×2 IMPLANT
CANNULA ACUFLEX KIT 5X76 (CANNULA) ×2 IMPLANT
CANNULA DRILOCK 5.0X75 (CANNULA) IMPLANT
CLOTH BEACON ORANGE TIMEOUT ST (SAFETY) ×2 IMPLANT
CONNECTOR 5 IN 1 STRAIGHT STRL (MISCELLANEOUS) ×2 IMPLANT
DRAPE INCISE 23X17 IOBAN STRL (DRAPES) ×1
DRAPE INCISE 23X17 STRL (DRAPES) ×1 IMPLANT
DRAPE INCISE IOBAN 23X17 STRL (DRAPES) ×1 IMPLANT
DRAPE INCISE IOBAN 66X45 STRL (DRAPES) ×2 IMPLANT
DRAPE STERI 35X30 U-POUCH (DRAPES) ×2 IMPLANT
DRAPE SURG 17X11 SM STRL (DRAPES) ×2 IMPLANT
DRAPE U-SHAPE 47X51 STRL (DRAPES) ×2 IMPLANT
DRSG PAD ABDOMINAL 8X10 ST (GAUZE/BANDAGES/DRESSINGS) ×4 IMPLANT
DURAPREP 26ML APPLICATOR (WOUND CARE) ×4 IMPLANT
ELECT REM PT RETURN 9FT ADLT (ELECTROSURGICAL) ×2
ELECTRODE REM PT RTRN 9FT ADLT (ELECTROSURGICAL) ×1 IMPLANT
GLOVE BIO SURGEON STRL SZ7.5 (GLOVE) ×2 IMPLANT
GLOVE BIO SURGEON STRL SZ8 (GLOVE) ×2 IMPLANT
GLOVE EUDERMIC 7 POWDERFREE (GLOVE) ×2 IMPLANT
GLOVE SS BIOGEL STRL SZ 7.5 (GLOVE) ×1 IMPLANT
GLOVE SUPERSENSE BIOGEL SZ 7.5 (GLOVE) ×1
GOWN STRL NON-REIN LRG LVL3 (GOWN DISPOSABLE) ×2 IMPLANT
GOWN STRL REIN XL XLG (GOWN DISPOSABLE) ×8 IMPLANT
KIT BASIN OR (CUSTOM PROCEDURE TRAY) ×2 IMPLANT
KIT ROOM TURNOVER OR (KITS) ×2 IMPLANT
KIT SHOULDER TRACTION (DRAPES) ×2 IMPLANT
MANIFOLD NEPTUNE II (INSTRUMENTS) ×2 IMPLANT
NDL SPNL 18GX3.5 QUINCKE PK (NEEDLE) ×1 IMPLANT
NDL SUT 6 .5 CRC .975X.05 MAYO (NEEDLE) IMPLANT
NEEDLE MAYO TAPER (NEEDLE)
NEEDLE SPNL 18GX3.5 QUINCKE PK (NEEDLE) ×2 IMPLANT
NS IRRIG 1000ML POUR BTL (IV SOLUTION) ×2 IMPLANT
PACK SHOULDER (CUSTOM PROCEDURE TRAY) ×2 IMPLANT
PAD ARMBOARD 7.5X6 YLW CONV (MISCELLANEOUS) ×4 IMPLANT
SET ARTHROSCOPY TUBING (MISCELLANEOUS) ×2
SET ARTHROSCOPY TUBING LN (MISCELLANEOUS) ×1 IMPLANT
SLING ARM FOAM STRAP LRG (SOFTGOODS) IMPLANT
SLING ARM FOAM STRAP MED (SOFTGOODS) ×2 IMPLANT
SLING SWATHE LARGE (SOFTGOODS) ×1 IMPLANT
SPONGE GAUZE 4X4 12PLY (GAUZE/BANDAGES/DRESSINGS) ×2 IMPLANT
SPONGE LAP 4X18 X RAY DECT (DISPOSABLE) ×2 IMPLANT
STRIP CLOSURE SKIN 1/2X4 (GAUZE/BANDAGES/DRESSINGS) ×2 IMPLANT
SUT MNCRL AB 3-0 PS2 18 (SUTURE) ×2 IMPLANT
SUT PDS AB 0 CT 36 (SUTURE) IMPLANT
SUT RETRIEVER GRASP 30 DEG (SUTURE) ×1 IMPLANT
SYR 20CC LL (SYRINGE) ×2 IMPLANT
TAPE PAPER 3X10 WHT MICROPORE (GAUZE/BANDAGES/DRESSINGS) ×2 IMPLANT
TOWEL OR 17X24 6PK STRL BLUE (TOWEL DISPOSABLE) ×2 IMPLANT
TOWEL OR 17X26 10 PK STRL BLUE (TOWEL DISPOSABLE) ×2 IMPLANT
WAND SUCTION MAX 4MM 90S (SURGICAL WAND) ×2 IMPLANT
WATER STERILE IRR 1000ML POUR (IV SOLUTION) ×2 IMPLANT

## 2013-07-01 NOTE — Progress Notes (Signed)
sm amt swelling rt neck, above dsg.  Area marked, and Dr Onnie Graham aware.  States to observe swelling, and states this is ok.

## 2013-07-01 NOTE — Anesthesia Procedure Notes (Addendum)
Anesthesia Regional Block:  Interscalene brachial plexus block  Pre-Anesthetic Checklist: ,, timeout performed, Correct Patient, Correct Site, Correct Laterality, Correct Procedure,, site marked, risks and benefits discussed, Surgical consent,  Pre-op evaluation,  At surgeon's request and post-op pain management  Laterality: Right  Prep: chloraprep       Needles:  Injection technique: Single-shot  Needle Type: Echogenic Stimulator Needle     Needle Length: 5cm 5 cm Needle Gauge: 22 and 22 G    Additional Needles:  Procedures: ultrasound guided (picture in chart) and nerve stimulator Interscalene brachial plexus block  Nerve Stimulator or Paresthesia:  Response: bicep contraction, 0.45 mA,   Additional Responses:   Narrative:  Start time: 07/01/2013 12:31 PM End time: 07/01/2013 12:41 PM Injection made incrementally with aspirations every 5 mL.  Performed by: Personally  Anesthesiologist: J. Tamela Gammon, MD  Additional Notes: Functioning IV was confirmed and monitors applied.  A 36mm 22ga echogenic arrow stimulator was used. Sterile prep and drape,hand hygiene and sterile gloves were used.Ultrasound guidance: relevant anatomy identified, needle position confirmed, local anesthetic spread visualized around nerve(s)., vascular puncture avoided.  Image printed for medical record.  Negative aspiration and negative test dose prior to incremental administration of local anesthetic. The patient tolerated the procedure well.  Interscalene brachial plexus block

## 2013-07-01 NOTE — Transfer of Care (Signed)
Immediate Anesthesia Transfer of Care Note  Patient: Nicholas Porter  Procedure(s) Performed: Procedure(s): RIGHT SHOULDER ARTHROSCOPY WITH  SUBACROMIAL DECOMPRESSION/DISTAL CLAVICLE RESECTION/ROTATOR CUFF REPAIR  (Right)  Patient Location: PACU  Anesthesia Type:General and Regional  Level of Consciousness: patient cooperative and responds to stimulation  Airway & Oxygen Therapy: Patient Spontanous Breathing and Patient connected to nasal cannula oxygen  Post-op Assessment: Report given to PACU RN and Post -op Vital signs reviewed and stable  Post vital signs: Reviewed and stable  Complications: No apparent anesthesia complications

## 2013-07-01 NOTE — H&P (Signed)
Nicholas Porter    Chief Complaint: RIGHT SHOULDER ROTATOR CUFF TEAR HPI: The patient is a 79 y.o. male with chronic right shoulder pain and MRI evidence of rotator cuff tear.  Past Medical History  Diagnosis Date  . ANEMIA, B12 DEFICIENCY 11/04/2008  . ARTHRITIS, HX OF 06/30/2007  . BACK PAIN, LUMBAR 03/07/2009  . BACK PAIN, UPPER 12/05/2010  . 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 COLONIC POLYPS, HX OF 08/25/2008  . CORONARY ARTERY DISEASE 05/22/2008  . DIVERTICULITIS, COLON 12/08/2008  . GERD 06/30/2007  . HIATAL HERNIA 08/25/2008  . HYPERKERATOSIS 10/19/2009  . HYPERLIPIDEMIA 10/18/2008  . HYPERTENSION 06/30/2007  . HYPOTHYROIDISM 10/18/2008  . Irritable bowel syndrome 12/08/2008  . OSTEOARTHRITIS 05/22/2008  . OSTEOPENIA 10/18/2008  . PEPTIC ULCER DISEASE, HX OF 06/30/2007  . RENAL FAILURE 12/06/2009  . THROMBOCYTOPENIA NOS 06/30/2007  . UNS ADVRS EFF OTH RX MEDICINAL&BIOLOGICAL SBSTNC 10/18/2008  . VITAMIN B12 DEFICIENCY 09/02/2008  . Carotid artery disease 2001    Relook cath in November 2011: 6-70% ostial circumflex OM lesion, 40-50% lesions in the LAD and RCA. Similar to 2001 catheterization.; Persantine Myoview October 2012: Lower scan no ischemia or infarction. EF 66%.  . Neck pain, chronic   . CKD (chronic kidney disease) 12/06/2011  . 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 Gallstones   . COPD (chronic obstructive pulmonary disease) 12/07/2011  . Carotid stenosis 12/07/2011  . Moderate aortic insufficiency February 2012    Echocardiogram: If greater detail percent with impaired relaxation. Mild/moderate mitral calcification with mild MR; moderate aortic regurgitation and mild sclerosis.  . Shortness of breath     Hx: of with exertion    Past Surgical History  Procedure Laterality Date  . Foot surgery      left  . Carpal tunnel release    . Hernia repair    . Total knee arthroplasty    . Carotid endarterectomy    . Amputation      left index finger -tramatic  . Vagotomy       partial gastrectomy  . Partial gastrectomy    . Corneal transplant    . Rotator cuff repair      left  . Spine surgery  1985    cervical laminectomy  . Cardiac catheterization  November 2011    40% mid LAD, 50% proximal RCA, 30-40% distal RCA, 60-70% ostial OM1. No change from 2001. Medical therapy    Family History  Problem Relation Age of Onset  . Heart disease Sister   . Diabetes Sister     Social History:  reports that he quit smoking about 48 years ago. His smoking use included Cigarettes. He smoked 0.00 packs per day. He has never used smokeless tobacco. He reports that he does not drink alcohol or use illicit drugs.  Allergies:  Allergies  Allergen Reactions  . Amlodipine Besylate Hives  . Naproxen Diarrhea  . Oxycodone-Acetaminophen Other (See Comments)    REACTION: unspecified    Medications Prior to Admission  Medication Sig Dispense Refill  . brinzolamide (AZOPT) 1 % ophthalmic suspension Place 1 drop into the left eye every 12 (twelve) hours.      Marland Kitchen HYDROcodone-acetaminophen (NORCO/VICODIN) 5-325 MG per tablet Take 1 tablet by mouth every 4 (four) hours as needed for pain.       . metoprolol succinate (TOPROL-XL) 50 MG 24 hr tablet Take 50 mg by mouth daily. Take with or immediately following a meal.      . nitroGLYCERIN (NITROSTAT) 0.4 MG SL  tablet Place 0.4 mg under the tongue every 5 (five) minutes as needed. For chest pain.      . simvastatin (ZOCOR) 20 MG tablet Take 20 mg by mouth daily.      . TRAVATAN Z 0.004 % SOLN ophthalmic solution Place 1 drop into both eyes at bedtime.       . methocarbamol (ROBAXIN) 500 MG tablet Take 500 mg by mouth every 6 (six) hours as needed (for muscle pain).          Physical Exam: right shoulder with painful and restricted motion as noted at recent office visit. See office notes for details  Vitals  Temp:  [98.2 F (36.8 C)] 98.2 F (36.8 C) (07/10 1000) Pulse Rate:  [58] 58 (07/10 1000) Resp:  [18] 18 (07/10  1000) BP: (156)/(63) 156/63 mmHg (07/10 1000) SpO2:  [99 %] 99 % (07/10 1000) Weight:  [63.6 kg (140 lb 3.4 oz)-65.772 kg (145 lb)] 63.6 kg (140 lb 3.4 oz) (07/10 1004)  Assessment/Plan  Impression: RIGHT SHOULDER ROTATOR CUFF TEAR  Plan of Action: Procedure(s): RIGHT SHOULDER ARTHROSCOPY WITH  SUBACROMIAL DECOMPRESSION/DISTAL CLAVICLE RESECTION/ROTATOR CUFF REPAIR   Amberlyn Martinezgarcia M 07/01/2013, 12:14 PM

## 2013-07-01 NOTE — Preoperative (Signed)
Beta Blockers   Reason not to administer Beta Blockers:Not Applicable 

## 2013-07-01 NOTE — Anesthesia Postprocedure Evaluation (Signed)
Anesthesia Post Note  Patient: Nicholas Porter  Procedure(s) Performed: Procedure(s) (LRB): RIGHT SHOULDER ARTHROSCOPY WITH  SUBACROMIAL DECOMPRESSION/DISTAL CLAVICLE RESECTION/ROTATOR CUFF REPAIR  (Right)  Anesthesia type: general  Patient location: PACU  Post pain: Pain level controlled  Post assessment: Patient's Cardiovascular Status Stable  Last Vitals:  Filed Vitals:   07/01/13 1600  BP:   Pulse: 45  Temp:   Resp: 15    Post vital signs: Reviewed and stable  Level of consciousness: sedated  Complications: No apparent anesthesia complications

## 2013-07-01 NOTE — Anesthesia Preprocedure Evaluation (Signed)
Anesthesia Evaluation  Patient identified by MRN, date of birth, ID band Patient awake    Reviewed: Allergy & Precautions, H&P , NPO status , Patient's Chart, lab work & pertinent test results  Airway       Dental   Pulmonary shortness of breath, COPD         Cardiovascular hypertension, + CAD and + Peripheral Vascular Disease     Neuro/Psych  Neuromuscular disease    GI/Hepatic GERD-  ,  Endo/Other  Hypothyroidism   Renal/GU Renal InsufficiencyRenal disease     Musculoskeletal   Abdominal   Peds  Hematology   Anesthesia Other Findings   Reproductive/Obstetrics                           Anesthesia Physical Anesthesia Plan  ASA: III  Anesthesia Plan: General   Post-op Pain Management:    Induction: Intravenous  Airway Management Planned: Oral ETT  Additional Equipment:   Intra-op Plan:   Post-operative Plan: Extubation in OR  Informed Consent: I have reviewed the patients History and Physical, chart, labs and discussed the procedure including the risks, benefits and alternatives for the proposed anesthesia with the patient or authorized representative who has indicated his/her understanding and acceptance.     Plan Discussed with: CRNA, Anesthesiologist and Surgeon  Anesthesia Plan Comments:         Anesthesia Quick Evaluation

## 2013-07-01 NOTE — Op Note (Signed)
07/01/2013  2:39 PM  PATIENT:   Nicholas Porter  79 y.o. male  PRE-OPERATIVE DIAGNOSIS:  RIGHT SHOULDER ROTATOR CUFF TEAR  POST-OPERATIVE DIAGNOSIS:  Same with chronic impingement, labral tear, ac joint oa.   PROCEDURE:  RSA, labral debridement, SAD, DCR, RCR  SURGEON:  Ceairra Mccarver, Metta Clines. M.D.  ASSISTANTS: Shuford pac   ANESTHESIA:   GET + ISB  EBL: min  SPECIMEN:  none  Drains: none   PATIENT DISPOSITION:  PACU - hemodynamically stable.    PLAN OF CARE: Discharge to home after PACU  Dictation# [id number]

## 2013-07-01 NOTE — Progress Notes (Signed)
Swelling in neck has decreased.

## 2013-07-02 ENCOUNTER — Emergency Department (HOSPITAL_COMMUNITY)
Admission: EM | Admit: 2013-07-02 | Discharge: 2013-07-02 | Disposition: A | Discharge status: Home / Self Care | Payer: Medicare Other | Attending: Emergency Medicine | Admitting: Emergency Medicine

## 2013-07-02 ENCOUNTER — Encounter (HOSPITAL_COMMUNITY): Payer: Self-pay

## 2013-07-02 DIAGNOSIS — I2510 Atherosclerotic heart disease of native coronary artery without angina pectoris: Secondary | ICD-10-CM | POA: Insufficient documentation

## 2013-07-02 DIAGNOSIS — M542 Cervicalgia: Secondary | ICD-10-CM | POA: Insufficient documentation

## 2013-07-02 DIAGNOSIS — E785 Hyperlipidemia, unspecified: Secondary | ICD-10-CM | POA: Diagnosis not present

## 2013-07-02 DIAGNOSIS — K902 Blind loop syndrome, not elsewhere classified: Secondary | ICD-10-CM | POA: Insufficient documentation

## 2013-07-02 DIAGNOSIS — N189 Chronic kidney disease, unspecified: Secondary | ICD-10-CM | POA: Diagnosis not present

## 2013-07-02 DIAGNOSIS — R109 Unspecified abdominal pain: Secondary | ICD-10-CM | POA: Insufficient documentation

## 2013-07-02 DIAGNOSIS — Z79899 Other long term (current) drug therapy: Secondary | ICD-10-CM | POA: Diagnosis not present

## 2013-07-02 DIAGNOSIS — R300 Dysuria: Secondary | ICD-10-CM | POA: Diagnosis not present

## 2013-07-02 DIAGNOSIS — Z8711 Personal history of peptic ulcer disease: Secondary | ICD-10-CM | POA: Diagnosis not present

## 2013-07-02 DIAGNOSIS — Z87891 Personal history of nicotine dependence: Secondary | ICD-10-CM | POA: Insufficient documentation

## 2013-07-02 DIAGNOSIS — Z8639 Personal history of other endocrine, nutritional and metabolic disease: Secondary | ICD-10-CM | POA: Diagnosis not present

## 2013-07-02 DIAGNOSIS — Z8719 Personal history of other diseases of the digestive system: Secondary | ICD-10-CM | POA: Insufficient documentation

## 2013-07-02 DIAGNOSIS — Z9889 Other specified postprocedural states: Secondary | ICD-10-CM | POA: Diagnosis not present

## 2013-07-02 DIAGNOSIS — Z860100 Personal history of colon polyps, unspecified: Secondary | ICD-10-CM | POA: Insufficient documentation

## 2013-07-02 DIAGNOSIS — Z87448 Personal history of other diseases of urinary system: Secondary | ICD-10-CM | POA: Diagnosis not present

## 2013-07-02 DIAGNOSIS — K219 Gastro-esophageal reflux disease without esophagitis: Secondary | ICD-10-CM | POA: Diagnosis not present

## 2013-07-02 DIAGNOSIS — Z8739 Personal history of other diseases of the musculoskeletal system and connective tissue: Secondary | ICD-10-CM | POA: Insufficient documentation

## 2013-07-02 DIAGNOSIS — R339 Retention of urine, unspecified: Secondary | ICD-10-CM | POA: Insufficient documentation

## 2013-07-02 DIAGNOSIS — I129 Hypertensive chronic kidney disease with stage 1 through stage 4 chronic kidney disease, or unspecified chronic kidney disease: Secondary | ICD-10-CM | POA: Insufficient documentation

## 2013-07-02 DIAGNOSIS — Z87828 Personal history of other (healed) physical injury and trauma: Secondary | ICD-10-CM | POA: Diagnosis not present

## 2013-07-02 DIAGNOSIS — Z862 Personal history of diseases of the blood and blood-forming organs and certain disorders involving the immune mechanism: Secondary | ICD-10-CM | POA: Diagnosis not present

## 2013-07-02 DIAGNOSIS — G8929 Other chronic pain: Secondary | ICD-10-CM | POA: Insufficient documentation

## 2013-07-02 DIAGNOSIS — Z8679 Personal history of other diseases of the circulatory system: Secondary | ICD-10-CM | POA: Insufficient documentation

## 2013-07-02 LAB — URINALYSIS, ROUTINE W REFLEX MICROSCOPIC
Glucose, UA: NEGATIVE mg/dL
Ketones, ur: NEGATIVE mg/dL
Leukocytes, UA: NEGATIVE
Nitrite: NEGATIVE
Protein, ur: NEGATIVE mg/dL

## 2013-07-02 MED ORDER — METOPROLOL SUCCINATE ER 50 MG PO TB24
50.0000 mg | ORAL_TABLET | Freq: Every day | ORAL | Status: DC
  Administered 2013-07-02: 50 mg via ORAL
  Filled 2013-07-02: qty 1

## 2013-07-02 NOTE — Op Note (Signed)
NAME:  Nicholas Porter, Nicholas Porter NO.:  [id number]  MEDICAL RECORD NO.:  [id number]  LOCATION:  [facility]                         FACILITY:  [facility]  PHYSICIAN:  Metta Clines. Demtrius Rounds, M.D.  DATE OF BIRTH:  [birth year]  DATE OF PROCEDURE:  07/01/2013 DATE OF DISCHARGE:                              OPERATIVE REPORT   PREOPERATIVE DIAGNOSES: 1. Chronic right shoulder pain with impingement syndrome. 2. Right shoulder full thickness rotator cuff tear. 3. Right shoulder acromioclavicular joint arthrosis.  POSTOPERATIVE DIAGNOSES: 1. Chronic right shoulder pain with impingement syndrome. 2. Right shoulder full thickness rotator cuff tear. 3. Right shoulder acromioclavicular joint arthrosis. 4. Extensive labral tear.  PROCEDURES: 1. Right shoulder examination under anesthesia. 2. Right shoulder glenohumeral joint diagnostic arthroscopy. 3. Debridement of complex and extensive labral tear. 4. Arthroscopic subacromial decompression and bursectomy. 5. Arthroscopic distal clavicle resection. 6. Arthroscopic rotator cuff repair using a double row suture bridge     repair construct.  SURGEON:  Metta Clines. Kianah Harries, M.D.  Nicholas Porter, P.A.-C.  ANESTHESIA:  General endotracheal as well as an interscalene block.  ESTIMATED BLOOD LOSS:  Minimal.  DRAINS:  None.  HISTORY:  Nicholas Porter is an 79 year old gentleman who has had chronic and progressively increasing right shoulder pain with an exacerbation after recent fall.  Plain radiographs showed no acute fractures, dislocations, but MRI scan does show a full-thickness tear of the rotator cuff with marked bony impingement and AC joint arthropathy.  Due to his ongoing pain and function limitations, he is brought to the operating room at this time for planned right shoulder arthroscopy as described below.  I preoperatively counseled Nicholas Porter on treatment options as well as risks versus benefits thereof.  Possible surgical  complications were reviewed including potential for bleeding, infection, neurovascular injury, persistent pain, loss of motion, anesthetic complication, possible need for additional surgery.  He understands and accepts and agrees with our planned procedure.  PROCEDURE IN DETAIL:  After undergoing routine preop evaluation, the patient received prophylactic antibiotics.  An interscalene block was established in the holding room by the Anesthesia Department.  Placed supine on the operating table, underwent smooth induction of a general endotracheal anesthesia.  Turned to the left lateral decubitus position on a beanbag and appropriately padded and protected.  Right shoulder examination under anesthesia revealed some mild restriction in mobility, but with gentle manipulation, was able to achieve 170 degrees of forward elevation.  Full rotation.  Right arm was then suspended at 70 degrees of abduction with 10 pounds of traction, and the right shoulder girdle region was sterilely prepped and draped in standard fashion.  Time-out was called.  A posterior portal was established in the glenohumeral joint, anterior portal was established under direct visualization.  The glenohumeral articular surfaces were in good condition.  The biceps anchor was stable.  Biceps tendon was normal in caliber with no distal instability.  There was extensive tearing of the superior labrum as well as extending anteriorly and posteriorly, which was all debrided with a shaver to a stable margin.  There was also a significant tear of the rotator cuff involving the majority of the supraspinatus over the width at its distal  insertion with horizontal elements as well.  This was debrided from the articular side.  No instability patterns were noted. At this point, fluid and instruments were then removed from the glenohumeral joint.  Arm was dropped down to 30 degrees of abduction with the arthroscope introduced in the  subacromial space to the posterior portal and a direct lateral portal established in the subacromial space.  Abundant dense bursal tissue multiple adhesions were encountered and these were all divided and excised with a shaver and Stryker wand.  Wand was then used to remove the periosteum from the undersurface of the anterior half of the acromion.  Then, a subacromial decompression was performed with a bur creating a type 1 morphology. Portal was then established directly anterior to the distal clavicle and distal clavicle resection was performed with a bur.  Care was taken to confirm visualization of the entire circumference of the distal clavicle to ensure adequate removal of the bone.  We then completed the subacromial/subdeltoid bursectomy.  The rotator cuff showed multiple areas of fraying and fibrillation with significant degenerative changes throughout the region of the supra and infraspinatus tendons with the obvious full-thickness defect involving a defect running longitudinally at the anterior aspect of the supraspinatus distally.  Very complex nature with significant portion of degenerative tendon.  We used a shaver to debride all the markedly degenerated and torn regions and ultimately had defect approximately 2 cm in width.  We did use a shaver, trimmed back to the most healthy-appearing tissue.  We then prepared the greater tuberosity to removing soft tissue with the Stryker wand and gently abrading the bone to bleeding bed.  Excision of the portal device was established and through the stab wound of left lateral margin of the acromion.  We placed an Arthrex PEEK corkscrew suture anchor.  The limbs of the suture anchor were then shuttled through the free margin of the rotator cuff using Mitek suture shaver in horizontal mattress pattern. These were all then tight with sliding locking knots followed by multiple overhand throws and alternating posts.  We then created  "suture bridge" with 2 swivel lock suture anchors, which nicely reinforced during repair and compressed the margin of the rotator cuff against the bony bed of the tuberosity.  Suture limbs were clipped.  The bursectomy was then completed.  Fluid and instrument were removed.  The overall construct was much to our satisfaction.  Nicholas Shuford, PA-C, was used as Environmental consultant throughout this case, essential for help with positioning the extremity, management of the arthroscopic equipment, tissue manipulation, suture management, wound closure, and intraoperative decision making.  All portals were closed with Monocryl and Steri-Strips.  Dry dressing taped at the right shoulder.  Right arm was placed in a sling mobilizer and the patient was awakened, extubated, and taken to the recovery room in stable condition.     Metta Clines. Evren Shankland, M.D.     KMS/MEDQ  D:  07/01/2013  T:  07/02/2013  Job:  [id number]

## 2013-07-02 NOTE — ED Provider Notes (Signed)
History    CSN: [id number] Arrival date & time 07/02/13  37  First MD Initiated Contact with Patient 07/02/13 1701     No chief complaint on file.  (Consider location/radiation/quality/duration/timing/severity/associated sxs/prior Treatment) The history is provided by the patient, the spouse and a relative.   79 year old male status post right rotator cuff surgery 2 days ago. Patient has not been elevated he urinates since he was discharged. Patient had Foley catheter placed in the ED prior to me seeing him had a large amount of urine over 1100 cc out. Patient's discomfort relieved. No other complaints. Patient still has pain from the right rotator cuff surgery but not out of the ordinary. Patient has medication at home. Her doctor called in a prescription for Flomax when the patient was having difficulty urinating. He has not started this prescription yet but has it available.   Past Medical History  Diagnosis Date  . ANEMIA, B12 DEFICIENCY 11/04/2008  . ARTHRITIS, HX OF 06/30/2007  . BACK PAIN, LUMBAR 03/07/2009  . BACK PAIN, UPPER 12/05/2010  . 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 COLONIC POLYPS, HX OF 08/25/2008  . CORONARY ARTERY DISEASE 05/22/2008  . DIVERTICULITIS, COLON 12/08/2008  . GERD 06/30/2007  . HIATAL HERNIA 08/25/2008  . HYPERKERATOSIS 10/19/2009  . HYPERLIPIDEMIA 10/18/2008  . HYPERTENSION 06/30/2007  . HYPOTHYROIDISM 10/18/2008  . Irritable bowel syndrome 12/08/2008  . OSTEOARTHRITIS 05/22/2008  . OSTEOPENIA 10/18/2008  . PEPTIC ULCER DISEASE, HX OF 06/30/2007  . RENAL FAILURE 12/06/2009  . THROMBOCYTOPENIA NOS 06/30/2007  . UNS ADVRS EFF OTH RX MEDICINAL&BIOLOGICAL SBSTNC 10/18/2008  . VITAMIN B12 DEFICIENCY 09/02/2008  . Carotid artery disease 2001    Relook cath in November 2011: 6-70% ostial circumflex OM lesion, 40-50% lesions in the LAD and RCA. Similar to 2001 catheterization.; Persantine Myoview October 2012: Lower scan no ischemia or infarction. EF 66%.  . Neck  pain, chronic   . CKD (chronic kidney disease) 12/06/2011  . 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 Gallstones   . COPD (chronic obstructive pulmonary disease) 12/07/2011  . Carotid stenosis 12/07/2011  . Moderate aortic insufficiency February 2012    Echocardiogram: If greater detail percent with impaired relaxation. Mild/moderate mitral calcification with mild MR; moderate aortic regurgitation and mild sclerosis.  . Shortness of breath     Hx: of with exertion   Past Surgical History  Procedure Laterality Date  . Foot surgery      left  . Carpal tunnel release    . Hernia repair    . Total knee arthroplasty    . Carotid endarterectomy    . Amputation      left index finger -tramatic  . Vagotomy      partial gastrectomy  . Partial gastrectomy    . Corneal transplant    . Rotator cuff repair      left  . Spine surgery  1985    cervical laminectomy  . Cardiac catheterization  November 2011    40% mid LAD, 50% proximal RCA, 30-40% distal RCA, 60-70% ostial OM1. No change from 2001. Medical therapy  . Shoulder arthroscopy with rotator cuff repair and subacromial decompression Right 07/01/2013    Procedure: RIGHT SHOULDER ARTHROSCOPY WITH  SUBACROMIAL DECOMPRESSION/DISTAL CLAVICLE RESECTION/ROTATOR CUFF REPAIR ;  Surgeon: Marin Shutter, MD;  Location: [facility];  Service: Orthopedics;  Laterality: Right;   Family History  Problem Relation Age of Onset  . Heart disease Sister   . Diabetes Sister    History  Substance  Use Topics  . Smoking status: Former Smoker    Types: Cigarettes    Quit date: 12/23/1964  . Smokeless tobacco: Never Used  . Alcohol Use: No    Review of Systems  Constitutional: Negative for fever.  HENT: Negative for neck pain.   Eyes: Negative for visual disturbance.  Respiratory: Negative for shortness of breath.   Cardiovascular: Negative for chest pain.  Gastrointestinal: Positive for abdominal pain. Negative for nausea and vomiting.   Genitourinary: Positive for decreased urine volume and difficulty urinating. Negative for hematuria.  Musculoskeletal: Negative for back pain.  Neurological: Negative for headaches.  Hematological: Does not bruise/bleed easily.  Psychiatric/Behavioral: Negative for confusion.    Allergies  Amlodipine besylate; Naproxen; and Oxycodone-acetaminophen  Home Medications   Current Outpatient Rx  Name  Route  Sig  Dispense  Refill  . brinzolamide (AZOPT) 1 % ophthalmic suspension   Left Eye   Place 1 drop into the left eye every 12 (twelve) hours.         Marland Kitchen HYDROcodone-acetaminophen (NORCO/VICODIN) 5-325 MG per tablet   Oral   Take 1 tablet by mouth every 4 (four) hours as needed for pain.          Marland Kitchen HYDROmorphone (DILAUDID) 2 MG tablet   Oral   Take 1-2 tablets (2-4 mg total) by mouth every 4 (four) hours as needed for pain.   50 tablet   0   . methocarbamol (ROBAXIN) 500 MG tablet   Oral   Take 1 tablet (500 mg total) by mouth 3 (three) times daily as needed.   30 tablet   1   . metoprolol succinate (TOPROL-XL) 50 MG 24 hr tablet   Oral   Take 50 mg by mouth daily. Take with or immediately following a meal.         . nitroGLYCERIN (NITROSTAT) 0.4 MG SL tablet   Sublingual   Place 0.4 mg under the tongue every 5 (five) minutes as needed. For chest pain.         . simvastatin (ZOCOR) 20 MG tablet   Oral   Take 20 mg by mouth daily.         . tamsulosin (FLOMAX) 0.4 MG CAPS   Oral   Take 0.4 mg by mouth daily. For 7 days. Started 07/02/13         . temazepam (RESTORIL) 15 MG capsule   Oral   Take 1 capsule (15 mg total) by mouth at bedtime as needed for sleep.   30 capsule   1   . TRAVATAN Z 0.004 % SOLN ophthalmic solution   Both Eyes   Place 1 drop into both eyes at bedtime.           BP 180/61  Pulse 63  Temp(Src) 98.1 F (36.7 C) (Oral)  Resp 18  SpO2 99% Physical Exam  Nursing note and vitals reviewed. Constitutional: He is oriented to  person, place, and time. He appears well-developed and well-nourished. No distress.  HENT:  Head: Normocephalic and atraumatic.  Mouth/Throat: Oropharynx is clear and moist.  Eyes: Conjunctivae and EOM are normal. Pupils are equal, round, and reactive to light.  Neck: Normal range of motion. Neck supple.  Cardiovascular: Normal rate, regular rhythm and normal heart sounds.   Pulmonary/Chest: Effort normal and breath sounds normal. No respiratory distress.  Abdominal: Soft. Bowel sounds are normal. There is no tenderness.  Musculoskeletal: Normal range of motion. He exhibits tenderness. He exhibits no edema.  Shoulder immobilizer  to the right shoulder. Radial pulse distally 2+.  Neurological: He is alert and oriented to person, place, and time. No cranial nerve deficit. He exhibits normal muscle tone. Coordination normal.  Skin: Skin is warm. No rash noted.    ED Course  Procedures (including critical care time) Labs Reviewed  URINALYSIS, ROUTINE W REFLEX MICROSCOPIC   Results for orders placed during the hospital encounter of 07/02/13  URINALYSIS, ROUTINE W REFLEX MICROSCOPIC      Result Value Range   Color, Urine YELLOW  YELLOW   APPearance CLEAR  CLEAR   Specific Gravity, Urine 1.016  1.005 - 1.030   pH 5.0  5.0 - 8.0   Glucose, UA NEGATIVE  NEGATIVE mg/dL   Hgb urine dipstick NEGATIVE  NEGATIVE   Bilirubin Urine NEGATIVE  NEGATIVE   Ketones, ur NEGATIVE  NEGATIVE mg/dL   Protein, ur NEGATIVE  NEGATIVE mg/dL   Urobilinogen, UA 0.2  0.0 - 1.0 mg/dL   Nitrite NEGATIVE  NEGATIVE   Leukocytes, UA NEGATIVE  NEGATIVE      No results found. 1. Urinary retention     MDM  Patient status post recent left-sided rotator cuff surgery they'll urinary retention has not voided since the surgery. Foley placed today patient had over 1100 cc of urine. Urinalysis is negative. Clear urinary retention will need a Foley catheter to remain in place with leg bag. Followup with urology  arranged.  Mervin Kung, MD 07/02/13 [phone]

## 2013-07-02 NOTE — ED Notes (Signed)
Foley bag changed to leg bag to go home with pt until pt seen by urologist per Dr. Leitha Schuller.

## 2013-07-02 NOTE — ED Notes (Signed)
Pt presents with 2 day h/o urinary retention.  Pt reports he had rotator cuff surgery to R shoulder yesterday due to a fall; reports decreased PO intake today due to inability to void.

## 2013-07-06 DIAGNOSIS — N401 Enlarged prostate with lower urinary tract symptoms: Secondary | ICD-10-CM | POA: Diagnosis not present

## 2013-07-06 DIAGNOSIS — R339 Retention of urine, unspecified: Secondary | ICD-10-CM | POA: Diagnosis not present

## 2013-07-07 DIAGNOSIS — Z9889 Other specified postprocedural states: Secondary | ICD-10-CM | POA: Diagnosis not present

## 2013-07-09 DIAGNOSIS — M25519 Pain in unspecified shoulder: Secondary | ICD-10-CM | POA: Diagnosis not present

## 2013-07-13 DIAGNOSIS — M25519 Pain in unspecified shoulder: Secondary | ICD-10-CM | POA: Diagnosis not present

## 2013-07-13 DIAGNOSIS — R339 Retention of urine, unspecified: Secondary | ICD-10-CM | POA: Diagnosis not present

## 2013-07-15 DIAGNOSIS — M25519 Pain in unspecified shoulder: Secondary | ICD-10-CM | POA: Diagnosis not present

## 2013-07-20 DIAGNOSIS — M25519 Pain in unspecified shoulder: Secondary | ICD-10-CM | POA: Diagnosis not present

## 2013-07-21 DIAGNOSIS — H4011X0 Primary open-angle glaucoma, stage unspecified: Secondary | ICD-10-CM | POA: Diagnosis not present

## 2013-07-22 DIAGNOSIS — M25519 Pain in unspecified shoulder: Secondary | ICD-10-CM | POA: Diagnosis not present

## 2013-07-27 DIAGNOSIS — M25519 Pain in unspecified shoulder: Secondary | ICD-10-CM | POA: Diagnosis not present

## 2013-07-30 DIAGNOSIS — M25519 Pain in unspecified shoulder: Secondary | ICD-10-CM | POA: Diagnosis not present

## 2013-08-03 DIAGNOSIS — M25519 Pain in unspecified shoulder: Secondary | ICD-10-CM | POA: Diagnosis not present

## 2013-08-04 DIAGNOSIS — Z9889 Other specified postprocedural states: Secondary | ICD-10-CM | POA: Diagnosis not present

## 2013-08-05 DIAGNOSIS — M25519 Pain in unspecified shoulder: Secondary | ICD-10-CM | POA: Diagnosis not present

## 2013-08-11 DIAGNOSIS — M25519 Pain in unspecified shoulder: Secondary | ICD-10-CM | POA: Diagnosis not present

## 2013-08-18 DIAGNOSIS — M25519 Pain in unspecified shoulder: Secondary | ICD-10-CM | POA: Diagnosis not present

## 2013-08-25 DIAGNOSIS — M25519 Pain in unspecified shoulder: Secondary | ICD-10-CM | POA: Diagnosis not present

## 2013-09-01 DIAGNOSIS — M25519 Pain in unspecified shoulder: Secondary | ICD-10-CM | POA: Diagnosis not present

## 2013-09-25 DIAGNOSIS — Z23 Encounter for immunization: Secondary | ICD-10-CM | POA: Diagnosis not present

## 2013-10-04 ENCOUNTER — Inpatient Hospital Stay (HOSPITAL_COMMUNITY)
Admission: EM | Admit: 2013-10-04 | Discharge: 2013-10-07 | DRG: 247 | Disposition: A | Discharge status: Home / Self Care | Payer: Medicare Other | Attending: Cardiology | Admitting: Cardiology

## 2013-10-04 DIAGNOSIS — I2119 ST elevation (STEMI) myocardial infarction involving other coronary artery of inferior wall: Secondary | ICD-10-CM | POA: Diagnosis not present

## 2013-10-04 DIAGNOSIS — Z79899 Other long term (current) drug therapy: Secondary | ICD-10-CM

## 2013-10-04 DIAGNOSIS — I059 Rheumatic mitral valve disease, unspecified: Secondary | ICD-10-CM | POA: Diagnosis not present

## 2013-10-04 DIAGNOSIS — I498 Other specified cardiac arrhythmias: Secondary | ICD-10-CM | POA: Diagnosis present

## 2013-10-04 DIAGNOSIS — N183 Chronic kidney disease, stage 3 unspecified: Secondary | ICD-10-CM | POA: Diagnosis present

## 2013-10-04 DIAGNOSIS — I359 Nonrheumatic aortic valve disorder, unspecified: Secondary | ICD-10-CM | POA: Diagnosis present

## 2013-10-04 DIAGNOSIS — Z87891 Personal history of nicotine dependence: Secondary | ICD-10-CM

## 2013-10-04 DIAGNOSIS — E876 Hypokalemia: Secondary | ICD-10-CM | POA: Diagnosis present

## 2013-10-04 DIAGNOSIS — Z955 Presence of coronary angioplasty implant and graft: Secondary | ICD-10-CM

## 2013-10-04 DIAGNOSIS — I213 ST elevation (STEMI) myocardial infarction of unspecified site: Secondary | ICD-10-CM

## 2013-10-04 DIAGNOSIS — E039 Hypothyroidism, unspecified: Secondary | ICD-10-CM

## 2013-10-04 DIAGNOSIS — D696 Thrombocytopenia, unspecified: Secondary | ICD-10-CM | POA: Diagnosis present

## 2013-10-04 DIAGNOSIS — I129 Hypertensive chronic kidney disease with stage 1 through stage 4 chronic kidney disease, or unspecified chronic kidney disease: Secondary | ICD-10-CM | POA: Diagnosis present

## 2013-10-04 DIAGNOSIS — I2510 Atherosclerotic heart disease of native coronary artery without angina pectoris: Secondary | ICD-10-CM

## 2013-10-04 DIAGNOSIS — I6529 Occlusion and stenosis of unspecified carotid artery: Secondary | ICD-10-CM | POA: Diagnosis present

## 2013-10-04 DIAGNOSIS — R001 Bradycardia, unspecified: Secondary | ICD-10-CM

## 2013-10-04 DIAGNOSIS — K219 Gastro-esophageal reflux disease without esophagitis: Secondary | ICD-10-CM

## 2013-10-04 DIAGNOSIS — J449 Chronic obstructive pulmonary disease, unspecified: Secondary | ICD-10-CM

## 2013-10-04 DIAGNOSIS — Z96659 Presence of unspecified artificial knee joint: Secondary | ICD-10-CM

## 2013-10-04 DIAGNOSIS — Z7982 Long term (current) use of aspirin: Secondary | ICD-10-CM

## 2013-10-04 DIAGNOSIS — K589 Irritable bowel syndrome without diarrhea: Secondary | ICD-10-CM | POA: Diagnosis present

## 2013-10-04 DIAGNOSIS — I219 Acute myocardial infarction, unspecified: Secondary | ICD-10-CM | POA: Diagnosis not present

## 2013-10-04 DIAGNOSIS — E785 Hyperlipidemia, unspecified: Secondary | ICD-10-CM | POA: Diagnosis present

## 2013-10-04 DIAGNOSIS — I10 Essential (primary) hypertension: Secondary | ICD-10-CM

## 2013-10-04 DIAGNOSIS — N189 Chronic kidney disease, unspecified: Secondary | ICD-10-CM

## 2013-10-04 DIAGNOSIS — J4489 Other specified chronic obstructive pulmonary disease: Secondary | ICD-10-CM | POA: Diagnosis present

## 2013-10-04 DIAGNOSIS — R072 Precordial pain: Secondary | ICD-10-CM | POA: Diagnosis not present

## 2013-10-04 DIAGNOSIS — R079 Chest pain, unspecified: Secondary | ICD-10-CM | POA: Diagnosis not present

## 2013-10-04 MED ORDER — HEPARIN SODIUM (PORCINE) 5000 UNIT/ML IJ SOLN
4000.0000 [IU] | INTRAMUSCULAR | Status: AC
  Administered 2013-10-05: 4000 [IU] via INTRAVENOUS

## 2013-10-04 MED ORDER — ONDANSETRON HCL 4 MG/2ML IJ SOLN
4.0000 mg | Freq: Once | INTRAMUSCULAR | Status: AC
  Filled 2013-10-04: qty 2

## 2013-10-04 MED ORDER — NITROGLYCERIN 0.4 MG SL SUBL: 0.4000 mg | SUBLINGUAL_TABLET | SUBLINGUAL | Status: DC | PRN

## 2013-10-04 MED ORDER — MORPHINE SULFATE 2 MG/ML IJ SOLN
INTRAMUSCULAR | Status: AC
  Filled 2013-10-04: qty 1

## 2013-10-04 MED ORDER — HEPARIN SODIUM (PORCINE) 5000 UNIT/ML IJ SOLN
INTRAMUSCULAR | Status: AC
  Filled 2013-10-04: qty 1

## 2013-10-04 MED ORDER — MORPHINE SULFATE 2 MG/ML IJ SOLN
2.0000 mg | Freq: Once | INTRAMUSCULAR | Status: AC
  Administered 2013-10-05: 2 mg via INTRAVENOUS

## 2013-10-04 MED ORDER — ONDANSETRON HCL 4 MG/2ML IJ SOLN
INTRAMUSCULAR | Status: AC
  Administered 2013-10-05: 4 mg via INTRAMUSCULAR
  Filled 2013-10-04: qty 2

## 2013-10-04 MED ORDER — NITROGLYCERIN IN D5W 200-5 MCG/ML-% IV SOLN
INTRAVENOUS | Status: AC
  Administered 2013-10-05: 50000 ug via INTRAVENOUS
  Filled 2013-10-04: qty 250

## 2013-10-04 MED ORDER — SODIUM CHLORIDE 0.9 % IV SOLN: INTRAVENOUS | Status: DC

## 2013-10-04 NOTE — ED Notes (Signed)
Per EMS pt has sudden onset of substernal chest pain sudden onset. Pt awake and alert at this time

## 2013-10-05 ENCOUNTER — Encounter (HOSPITAL_COMMUNITY)
Admission: EM | Disposition: A | Discharge status: Home / Self Care | Payer: Self-pay | Source: Home / Self Care | Attending: Cardiology

## 2013-10-05 ENCOUNTER — Ambulatory Visit (HOSPITAL_COMMUNITY): Admit: 2013-10-05 | Payer: Self-pay | Admitting: Cardiology

## 2013-10-05 ENCOUNTER — Encounter (HOSPITAL_COMMUNITY): Payer: Self-pay | Admitting: *Deleted

## 2013-10-05 DIAGNOSIS — J449 Chronic obstructive pulmonary disease, unspecified: Secondary | ICD-10-CM | POA: Diagnosis not present

## 2013-10-05 DIAGNOSIS — D696 Thrombocytopenia, unspecified: Secondary | ICD-10-CM | POA: Diagnosis not present

## 2013-10-05 DIAGNOSIS — I219 Acute myocardial infarction, unspecified: Secondary | ICD-10-CM

## 2013-10-05 DIAGNOSIS — I2119 ST elevation (STEMI) myocardial infarction involving other coronary artery of inferior wall: Secondary | ICD-10-CM

## 2013-10-05 DIAGNOSIS — I10 Essential (primary) hypertension: Secondary | ICD-10-CM

## 2013-10-05 DIAGNOSIS — I2510 Atherosclerotic heart disease of native coronary artery without angina pectoris: Secondary | ICD-10-CM

## 2013-10-05 DIAGNOSIS — I059 Rheumatic mitral valve disease, unspecified: Secondary | ICD-10-CM | POA: Diagnosis not present

## 2013-10-05 DIAGNOSIS — R079 Chest pain, unspecified: Secondary | ICD-10-CM | POA: Diagnosis not present

## 2013-10-05 HISTORY — PX: LEFT HEART CATHETERIZATION WITH CORONARY ANGIOGRAM: SHX5451

## 2013-10-05 HISTORY — DX: ST elevation (STEMI) myocardial infarction involving other coronary artery of inferior wall: I21.19

## 2013-10-05 LAB — CBC
HCT: 35.2 % — ABNORMAL LOW (ref 39.0–52.0)
Hemoglobin: 14.1 g/dL (ref 13.0–17.0)
Hemoglobin: 12.5 g/dL — ABNORMAL LOW (ref 13.0–17.0)
MCH: 33.9 pg (ref 26.0–34.0)
MCH: 33 pg (ref 26.0–34.0)
MCHC: 36.5 g/dL — ABNORMAL HIGH (ref 30.0–36.0)
MCHC: 35.5 g/dL (ref 30.0–36.0)
MCV: 92.9 fL (ref 78.0–100.0)
Platelets: 115 10*3/uL — ABNORMAL LOW (ref 150–400)
Platelets: 87 K/uL — ABNORMAL LOW (ref 150–400)
RBC: 3.79 MIL/uL — ABNORMAL LOW (ref 4.22–5.81)
RDW: 14.1 % (ref 11.5–15.5)
RDW: 14.2 % (ref 11.5–15.5)
WBC: 7.9 K/uL (ref 4.0–10.5)

## 2013-10-05 LAB — POCT I-STAT, CHEM 8
BUN: 23 mg/dL (ref 6–23)
BUN: 26 mg/dL — ABNORMAL HIGH (ref 6–23)
Calcium, Ion: 1.14 mmol/L (ref 1.13–1.30)
Chloride: 110 mEq/L (ref 96–112)
Chloride: 99 meq/L (ref 96–112)
Creatinine, Ser: 1 mg/dL (ref 0.50–1.35)
Creatinine, Ser: 1.6 mg/dL — ABNORMAL HIGH (ref 0.50–1.35)
Glucose, Bld: 164 mg/dL — ABNORMAL HIGH (ref 70–99)
Glucose, Bld: 183 mg/dL — ABNORMAL HIGH (ref 70–99)
HCT: 35 % — ABNORMAL LOW (ref 39.0–52.0)
HCT: 40 % (ref 39.0–52.0)
Hemoglobin: 11.9 g/dL — ABNORMAL LOW (ref 13.0–17.0)
Hemoglobin: 13.6 g/dL (ref 13.0–17.0)
Potassium: 2.7 mEq/L — CL (ref 3.5–5.1)
Potassium: 2.7 meq/L — CL (ref 3.5–5.1)
Sodium: 132 meq/L — ABNORMAL LOW (ref 135–145)
Sodium: 143 mEq/L (ref 135–145)
TCO2: 16 mmol/L (ref 0–100)

## 2013-10-05 LAB — LIPID PANEL
Cholesterol: 92 mg/dL (ref 0–200)
HDL: 42 mg/dL
LDL Cholesterol: 37 mg/dL (ref 0–99)
Total CHOL/HDL Ratio: 2.2 ratio
Triglycerides: 64 mg/dL
VLDL: 13 mg/dL (ref 0–40)

## 2013-10-05 LAB — BASIC METABOLIC PANEL
BUN: 24 mg/dL — ABNORMAL HIGH (ref 6–23)
CO2: 15 mEq/L — ABNORMAL LOW (ref 19–32)
Calcium: 8.4 mg/dL (ref 8.4–10.5)
Chloride: 107 mEq/L (ref 96–112)
Creatinine, Ser: 1.27 mg/dL (ref 0.50–1.35)
GFR calc Af Amer: 59 mL/min — ABNORMAL LOW (ref >90)
Glucose, Bld: 179 mg/dL — ABNORMAL HIGH (ref 70–99)

## 2013-10-05 LAB — POCT I-STAT TROPONIN I: Troponin i, poc: 0.01 ng/mL (ref 0.00–0.08)

## 2013-10-05 LAB — MRSA PCR SCREENING: MRSA by PCR: NEGATIVE

## 2013-10-05 LAB — HEMOGLOBIN A1C
Hgb A1c MFr Bld: 5.3 %
Mean Plasma Glucose: 105 mg/dL

## 2013-10-05 LAB — POCT ACTIVATED CLOTTING TIME
Activated Clotting Time: 237 s
Activated Clotting Time: 467 seconds

## 2013-10-05 LAB — COMPREHENSIVE METABOLIC PANEL
ALT: 28 U/L (ref 0–53)
AST: 56 U/L — ABNORMAL HIGH (ref 0–37)
Albumin: 3.3 g/dL — ABNORMAL LOW (ref 3.5–5.2)
Alkaline Phosphatase: 104 U/L (ref 39–117)
Calcium: 8.5 mg/dL (ref 8.4–10.5)
GFR calc Af Amer: 56 mL/min — ABNORMAL LOW (ref >90)
Potassium: 2.9 mEq/L — ABNORMAL LOW (ref 3.5–5.1)
Sodium: 138 mEq/L (ref 135–145)
Total Protein: 6.4 g/dL (ref 6.0–8.3)

## 2013-10-05 LAB — TROPONIN I
Troponin I: 8.88 ng/mL (ref <0.30)
Troponin I: >20 ng/mL

## 2013-10-05 LAB — PROTIME-INR
INR: 1.09 (ref 0.00–1.49)
INR: 1.14 (ref 0.00–1.49)

## 2013-10-05 LAB — APTT: aPTT: 21 seconds — ABNORMAL LOW (ref 24–37)

## 2013-10-05 LAB — TSH: TSH: 3.036 u[IU]/mL (ref 0.350–4.500)

## 2013-10-05 LAB — PRO B NATRIURETIC PEPTIDE: Pro B Natriuretic peptide (BNP): 668.4 pg/mL — ABNORMAL HIGH (ref 0–450)

## 2013-10-05 SURGERY — LEFT HEART CATHETERIZATION WITH CORONARY ANGIOGRAM
Anesthesia: LOCAL

## 2013-10-05 MED ORDER — LATANOPROST 0.005 % OP SOLN
1.0000 [drp] | Freq: Every day | OPHTHALMIC | Status: DC
  Administered 2013-10-05 – 2013-10-06 (×2): 1 [drp] via OPHTHALMIC
  Filled 2013-10-05: qty 2.5

## 2013-10-05 MED ORDER — POTASSIUM CHLORIDE ER 10 MEQ PO TBCR
40.0000 meq | EXTENDED_RELEASE_TABLET | Freq: Once | ORAL | Status: AC
  Administered 2013-10-05: 40 meq via ORAL
  Filled 2013-10-05 (×2): qty 4

## 2013-10-05 MED ORDER — MAGNESIUM SULFATE 40 MG/ML IJ SOLN
2.0000 g | Freq: Once | INTRAMUSCULAR | Status: AC
  Administered 2013-10-05: 2 g via INTRAVENOUS
  Filled 2013-10-05: qty 50

## 2013-10-05 MED ORDER — ACETAMINOPHEN 325 MG PO TABS: 650.0000 mg | ORAL_TABLET | ORAL | Status: DC | PRN

## 2013-10-05 MED ORDER — HYDRALAZINE HCL 20 MG/ML IJ SOLN
10.0000 mg | INTRAMUSCULAR | Status: DC | PRN
  Administered 2013-10-05: 10 mg via INTRAVENOUS

## 2013-10-05 MED ORDER — HEPARIN SODIUM (PORCINE) 1000 UNIT/ML IJ SOLN
INTRAMUSCULAR | Status: AC
  Filled 2013-10-05: qty 1

## 2013-10-05 MED ORDER — FENTANYL CITRATE 0.05 MG/ML IJ SOLN
INTRAMUSCULAR | Status: AC
  Filled 2013-10-05: qty 2

## 2013-10-05 MED ORDER — TICAGRELOR 90 MG PO TABS: 90.0000 mg | ORAL_TABLET | Freq: Two times a day (BID) | ORAL | Status: DC

## 2013-10-05 MED ORDER — ONDANSETRON HCL 4 MG/2ML IJ SOLN
4.0000 mg | Freq: Four times a day (QID) | INTRAMUSCULAR | Status: DC | PRN
  Administered 2013-10-05: 4 mg via INTRAVENOUS
  Filled 2013-10-05: qty 2

## 2013-10-05 MED ORDER — TAMSULOSIN HCL 0.4 MG PO CAPS
0.4000 mg | ORAL_CAPSULE | Freq: Every day | ORAL | Status: DC
  Administered 2013-10-05 – 2013-10-07 (×3): 0.4 mg via ORAL
  Filled 2013-10-05 (×3): qty 1

## 2013-10-05 MED ORDER — ATORVASTATIN CALCIUM 80 MG PO TABS
80.0000 mg | ORAL_TABLET | Freq: Every day | ORAL | Status: DC
  Administered 2013-10-05 – 2013-10-06 (×3): 80 mg via ORAL
  Filled 2013-10-05 (×4): qty 1

## 2013-10-05 MED ORDER — METOPROLOL TARTRATE 12.5 MG HALF TABLET
12.5000 mg | ORAL_TABLET | Freq: Two times a day (BID) | ORAL | Status: DC
  Administered 2013-10-05 (×2): 12.5 mg via ORAL
  Filled 2013-10-05 (×4): qty 1

## 2013-10-05 MED ORDER — BRINZOLAMIDE 1 % OP SUSP
1.0000 [drp] | Freq: Two times a day (BID) | OPHTHALMIC | Status: DC
  Administered 2013-10-05 – 2013-10-07 (×3): 1 [drp] via OPHTHALMIC
  Filled 2013-10-05: qty 10

## 2013-10-05 MED ORDER — MORPHINE SULFATE 2 MG/ML IJ SOLN: 2.0000 mg | INTRAMUSCULAR | Status: DC | PRN

## 2013-10-05 MED ORDER — TICAGRELOR 90 MG PO TABS
ORAL_TABLET | ORAL | Status: AC
  Filled 2013-10-05: qty 2

## 2013-10-05 MED ORDER — NITROGLYCERIN 0.2 MG/ML ON CALL CATH LAB
INTRAVENOUS | Status: AC
  Filled 2013-10-05: qty 1

## 2013-10-05 MED ORDER — SIMVASTATIN 20 MG PO TABS: 20.0000 mg | ORAL_TABLET | Freq: Every day | ORAL | Status: DC

## 2013-10-05 MED ORDER — LATANOPROST 0.005 % OP SOLN
1.0000 [drp] | Freq: Every day | OPHTHALMIC | Status: DC
  Filled 2013-10-05: qty 2.5

## 2013-10-05 MED ORDER — HYDROMORPHONE HCL 2 MG PO TABS: 2.0000 mg | ORAL_TABLET | ORAL | Status: DC | PRN

## 2013-10-05 MED ORDER — NITROGLYCERIN 0.4 MG SL SUBL: 0.4000 mg | SUBLINGUAL_TABLET | SUBLINGUAL | Status: DC | PRN

## 2013-10-05 MED ORDER — VERAPAMIL HCL 2.5 MG/ML IV SOLN
INTRAVENOUS | Status: AC
  Filled 2013-10-05: qty 2

## 2013-10-05 MED ORDER — POTASSIUM CHLORIDE 10 MEQ/100ML IV SOLN
10.0000 meq | INTRAVENOUS | Status: AC
  Administered 2013-10-05 (×3): 10 meq via INTRAVENOUS
  Filled 2013-10-05 (×3): qty 100

## 2013-10-05 MED ORDER — HEPARIN (PORCINE) IN NACL 2-0.9 UNIT/ML-% IJ SOLN
INTRAMUSCULAR | Status: AC
  Filled 2013-10-05: qty 1000

## 2013-10-05 MED ORDER — BRINZOLAMIDE 1 % OP SUSP
1.0000 [drp] | Freq: Two times a day (BID) | OPHTHALMIC | Status: DC
  Filled 2013-10-05: qty 10

## 2013-10-05 MED ORDER — MIDAZOLAM HCL 2 MG/2ML IJ SOLN
INTRAMUSCULAR | Status: AC
  Filled 2013-10-05: qty 2

## 2013-10-05 MED ORDER — HEPARIN SODIUM (PORCINE) 5000 UNIT/ML IJ SOLN
5000.0000 [IU] | Freq: Three times a day (TID) | INTRAMUSCULAR | Status: DC
  Administered 2013-10-05 – 2013-10-07 (×5): 5000 [IU] via SUBCUTANEOUS
  Filled 2013-10-05 (×8): qty 1

## 2013-10-05 MED ORDER — HYDRALAZINE HCL 20 MG/ML IJ SOLN
INTRAMUSCULAR | Status: AC
  Filled 2013-10-05: qty 1

## 2013-10-05 MED ORDER — ASPIRIN EC 81 MG PO TBEC
81.0000 mg | DELAYED_RELEASE_TABLET | Freq: Every day | ORAL | Status: DC
  Administered 2013-10-06 – 2013-10-07 (×2): 81 mg via ORAL
  Filled 2013-10-05 (×2): qty 1

## 2013-10-05 MED ORDER — LISINOPRIL 2.5 MG PO TABS
2.5000 mg | ORAL_TABLET | Freq: Every day | ORAL | Status: DC
  Administered 2013-10-05 – 2013-10-07 (×3): 2.5 mg via ORAL
  Filled 2013-10-05 (×3): qty 1

## 2013-10-05 MED ORDER — POTASSIUM CHLORIDE 10 MEQ/100ML IV SOLN
INTRAVENOUS | Status: AC
  Administered 2013-10-05: 10 meq
  Filled 2013-10-05: qty 100

## 2013-10-05 MED ORDER — LIDOCAINE HCL (PF) 1 % IJ SOLN
INTRAMUSCULAR | Status: AC
  Filled 2013-10-05: qty 30

## 2013-10-05 MED ORDER — SODIUM CHLORIDE 0.9 % IV SOLN: INTRAVENOUS | Status: DC

## 2013-10-05 MED ORDER — ONDANSETRON HCL 4 MG/2ML IJ SOLN: 4.0000 mg | Freq: Four times a day (QID) | INTRAMUSCULAR | Status: DC | PRN

## 2013-10-05 MED ORDER — SODIUM CHLORIDE 0.9 % IV SOLN
1.0000 mL/kg/h | INTRAVENOUS | Status: AC
  Administered 2013-10-05: 1 mL/kg/h via INTRAVENOUS

## 2013-10-05 MED ORDER — METHOCARBAMOL 500 MG PO TABS
500.0000 mg | ORAL_TABLET | Freq: Three times a day (TID) | ORAL | Status: DC | PRN
  Filled 2013-10-05: qty 1

## 2013-10-05 MED ORDER — TEMAZEPAM 15 MG PO CAPS: 15.0000 mg | ORAL_CAPSULE | Freq: Every evening | ORAL | Status: DC | PRN

## 2013-10-05 MED ORDER — TICAGRELOR 90 MG PO TABS
90.0000 mg | ORAL_TABLET | Freq: Two times a day (BID) | ORAL | Status: DC
  Administered 2013-10-05 – 2013-10-07 (×4): 90 mg via ORAL
  Filled 2013-10-05 (×5): qty 1

## 2013-10-05 NOTE — Progress Notes (Signed)
[id number] Education started with pt and wife. Gave MI booklet and reviewed MI restrictions, angina and use of NTG, stent and brilinta. Gave brilinta packet. Pt does not have stent booklet in room and wife states does not have. Notified pt's RN. Will follow up tomorrow for ambulation and to continue ed. Stressed importance of not over doing at home.  Jeani Sow, RN BSN 10/05/2013 11:13 AM

## 2013-10-05 NOTE — CV Procedure (Signed)
    Cardiac Catheterization Procedure Note  Name: Nicholas Porter MRN: [id number] DOB: [birth year]  Procedure: Left Heart Cath, Selective Coronary Angiography,  PTCA and stenting of the RCA  Indication: 79 yo WM with history of CAD treated medically presents with an acute inferior STEMI with 3-4 mm ST elevation in the inferior leads.   Procedural Details:  The right wrist was prepped, draped, and anesthetized with 1% lidocaine. Using the modified Seldinger technique, a 5 French sheath was introduced into the right radial artery. 3 mg of verapamil was administered through the sheath, weight-based unfractionated heparin was administered intravenously. Standard Judkins catheters were used for selective coronary angiography and left ventriculography. Catheter exchanges were performed over an exchange length guidewire.  PROCEDURAL FINDINGS Hemodynamics: AO 158/61 mean 96 mm Hg LV 155/30 mm Hg   Coronary angiography: Coronary dominance: right  Left mainstem: Calcified, normal.  Left anterior descending (LAD): 70% mid LAD immediately after the first diagonal.  Left circumflex (LCx): 30% proximal. 80% proximal OM2  Right coronary artery (RCA): 30% ostial. 100% mid RCA with TIMI 0 flow.  Left ventriculography: Not done   PCI Note:  Following the diagnostic procedure, the decision was made to proceed with PCI of the RCA. Brilinta 180 mg was given orally.Weight-based heparin was given for anticoagulation. Once a therapeutic ACT was achieved, a 6 [place] FR4 guide catheter was inserted.  A prowater coronary guidewire was used to cross the lesion.  The lesion was predilated with a 2.5 mm balloon.  The lesion was then stented with a 3.0 x 28 mm Promus premier stent deployed to 14 atm.    Following PCI, there was 0% residual stenosis and TIMI-3 flow. Final angiography confirmed an excellent result. The patient tolerated the procedure well. There were no immediate procedural complications. A TR band was  used for radial hemostasis. The patient was transferred to the post catheterization recovery area for further monitoring.  PCI Data: Vessel - RCA/Segment - mid Percent Stenosis (pre)  100% TIMI-flow 0 Stent 3.0 x 28 mm Promus premier. Percent Stenosis (post) 0% TIMI-flow (post) 3  Final Conclusions:   1. 3 vessel obstructive CAD. 70% mid LAD, 80% OM2, 100% mid RCA. 2. Elevated LVEDP 3. Successful stenting of the mid RCA with a DES.   Recommendations:  Dual antiplatelet therapy for one year. Avoid beta blocker due to AV block and bradycardia for now. Will obtain Echo to assess LV function. I anticipate treating his other disease medically.  [facility] 10/05/2013, 1:08 AM

## 2013-10-05 NOTE — H&P (Signed)
Nicholas Porter is an 79 y.o. male.   Chief Complaint: Chest Pain, Inferior STEMI Primary cardiologist: Dr. Ellyn Hack HPI: Nicholas Porter is an 79 yo man with PMH of hypertension, dyslipidemia, coronary artery disease, hypothyroidism, GERD who presents with substernal chest pain that began after 10 pm when he was lying in bed right after going to bed. He described the pain as pressure and sharp, not improved until in the ER with SL NTG from 10 down to 6 in intensity. In the ambulance he was noted to have inferior ST elevation and Code STEMI activation. He has some associated nausea. No weight loss. No significant bleeding issues but some light bruising on his arms. He has no falls. He tells me he has been getting chest pain taking the trash out. He does note some associated SOB, particularly with lying more flat. In the ER he received a large aspirin and 4000 units heparin. His potassium was noted to be 2.7 and repletion was started with IV KCl and he was taken emergently to the cath lab.     Past Medical History  Diagnosis Date  . ANEMIA, B12 DEFICIENCY 11/04/2008  . ARTHRITIS, HX OF 06/30/2007  . BACK PAIN, LUMBAR 03/07/2009  . BACK PAIN, UPPER 12/05/2010  . 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 COLONIC POLYPS, HX OF 08/25/2008  . CORONARY ARTERY DISEASE 05/22/2008  . DIVERTICULITIS, COLON 12/08/2008  . GERD 06/30/2007  . HIATAL HERNIA 08/25/2008  . HYPERKERATOSIS 10/19/2009  . HYPERLIPIDEMIA 10/18/2008  . HYPERTENSION 06/30/2007  . HYPOTHYROIDISM 10/18/2008  . Irritable bowel syndrome 12/08/2008  . OSTEOARTHRITIS 05/22/2008  . OSTEOPENIA 10/18/2008  . PEPTIC ULCER DISEASE, HX OF 06/30/2007  . RENAL FAILURE 12/06/2009  . THROMBOCYTOPENIA NOS 06/30/2007  . UNS ADVRS EFF OTH RX MEDICINAL&BIOLOGICAL SBSTNC 10/18/2008  . VITAMIN B12 DEFICIENCY 09/02/2008  . Carotid artery disease 2001    Relook cath in November 2011: 6-70% ostial circumflex OM lesion, 40-50% lesions in the LAD and RCA. Similar to 2001  catheterization.; Persantine Myoview October 2012: Lower scan no ischemia or infarction. EF 66%.  . Neck pain, chronic   . CKD (chronic kidney disease) 12/06/2011  . 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 Gallstones   . COPD (chronic obstructive pulmonary disease) 12/07/2011  . Carotid stenosis 12/07/2011  . Moderate aortic insufficiency February 2012    Echocardiogram: If greater detail percent with impaired relaxation. Mild/moderate mitral calcification with mild MR; moderate aortic regurgitation and mild sclerosis.  . Shortness of breath     Hx: of with exertion    Past Surgical History  Procedure Laterality Date  . Foot surgery      left  . Carpal tunnel release    . Hernia repair    . Total knee arthroplasty    . Carotid endarterectomy    . Amputation      left index finger -tramatic  . Vagotomy      partial gastrectomy  . Partial gastrectomy    . Corneal transplant    . Rotator cuff repair      left  . Spine surgery  1985    cervical laminectomy  . Cardiac catheterization  November 2011    40% mid LAD, 50% proximal RCA, 30-40% distal RCA, 60-70% ostial OM1. No change from 2001. Medical therapy  . Shoulder arthroscopy with rotator cuff repair and subacromial decompression Right 07/01/2013    Procedure: RIGHT SHOULDER ARTHROSCOPY WITH  SUBACROMIAL DECOMPRESSION/DISTAL CLAVICLE RESECTION/ROTATOR CUFF REPAIR ;  Surgeon: Marin Shutter, MD;  Location: [place];  Service: Orthopedics;  Laterality: Right;    Family History  Problem Relation Age of Onset  . Heart disease Sister   . Diabetes Sister    Social History:  reports that he quit smoking about 48 years ago. His smoking use included Cigarettes. He smoked 0.00 packs per day. He has never used smokeless tobacco. He reports that he does not drink alcohol or use illicit drugs.  Allergies:  Allergies  Allergen Reactions  . Amlodipine Besylate Hives  . Naproxen Diarrhea  . Oxycodone-Acetaminophen Other (See Comments)     REACTION: unspecified    Medications Prior to Admission  Medication Sig Dispense Refill  . brinzolamide (AZOPT) 1 % ophthalmic suspension Place 1 drop into the left eye every 12 (twelve) hours.      Marland Kitchen HYDROcodone-acetaminophen (NORCO/VICODIN) 5-325 MG per tablet Take 1 tablet by mouth every 4 (four) hours as needed for pain.       Marland Kitchen HYDROmorphone (DILAUDID) 2 MG tablet Take 1-2 tablets (2-4 mg total) by mouth every 4 (four) hours as needed for pain.  50 tablet  0  . methocarbamol (ROBAXIN) 500 MG tablet Take 1 tablet (500 mg total) by mouth 3 (three) times daily as needed.  30 tablet  1  . metoprolol succinate (TOPROL-XL) 50 MG 24 hr tablet Take 50 mg by mouth daily. Take with or immediately following a meal.      . nitroGLYCERIN (NITROSTAT) 0.4 MG SL tablet Place 0.4 mg under the tongue every 5 (five) minutes as needed. For chest pain.      . simvastatin (ZOCOR) 20 MG tablet Take 20 mg by mouth daily.      . tamsulosin (FLOMAX) 0.4 MG CAPS Take 0.4 mg by mouth daily. For 7 days. Started 07/02/13      . temazepam (RESTORIL) 15 MG capsule Take 1 capsule (15 mg total) by mouth at bedtime as needed for sleep.  30 capsule  1  . TRAVATAN Z 0.004 % SOLN ophthalmic solution Place 1 drop into both eyes at bedtime.         Results for orders placed during the hospital encounter of 10/04/13 (from the past 48 hour(s))  APTT     Status: Abnormal   Collection Time    10/04/13 11:55 PM      Result Value Range   aPTT 21 (*) 24 - 37 seconds  CBC     Status: Abnormal (Preliminary result)   Collection Time    10/04/13 11:55 PM      Result Value Range   WBC 10.3  4.0 - 10.5 K/uL   RBC 4.16 (*) 4.22 - 5.81 MIL/uL   Hemoglobin 14.1  13.0 - 17.0 g/dL   HCT 38.6 (*) 39.0 - 52.0 %   MCV 92.8  78.0 - 100.0 fL   MCH 33.9  26.0 - 34.0 pg   MCHC 36.5 (*) 30.0 - 36.0 g/dL   RDW 14.1  11.5 - 15.5 %   Platelets PENDING  150 - 400 K/uL  PROTIME-INR     Status: None   Collection Time    10/04/13 11:55 PM       Result Value Range   Prothrombin Time 13.9  11.6 - 15.2 seconds   INR 1.09  0.00 - 1.49  POCT I-STAT TROPONIN I     Status: None   Collection Time    10/05/13 12:02 AM      Result Value Range   Troponin i, poc 0.01  0.00 - 0.08 ng/mL   Comment 3            Comment: Due to the release kinetics of cTnI,     a negative result within the first hours     of the onset of symptoms does not rule out     myocardial infarction with certainty.     If myocardial infarction is still suspected,     repeat the test at appropriate intervals.  POCT I-STAT, CHEM 8     Status: Abnormal   Collection Time    10/05/13 12:04 AM      Result Value Range   Sodium 143  135 - 145 mEq/L   Potassium 2.7 (*) 3.5 - 5.1 mEq/L   Chloride 110  96 - 112 mEq/L   BUN 26 (*) 6 - 23 mg/dL   Creatinine, Ser 1.60 (*) 0.50 - 1.35 mg/dL   Glucose, Bld 183 (*) 70 - 99 mg/dL   Calcium, Ion 1.16  1.13 - 1.30 mmol/L   TCO2 17  0 - 100 mmol/L   Hemoglobin 13.6  13.0 - 17.0 g/dL   HCT 40.0  39.0 - 52.0 %   Comment NOTIFIED PHYSICIAN     No results found.  Review of Systems  Constitutional: Positive for malaise/fatigue. Negative for fever, chills and weight loss.  HENT: Negative for tinnitus.   Eyes: Negative for blurred vision, double vision and pain.  Respiratory: Positive for shortness of breath. Negative for cough and hemoptysis.   Cardiovascular: Positive for chest pain and orthopnea. Negative for palpitations.  Gastrointestinal: Positive for heartburn and nausea. Negative for abdominal pain, diarrhea, blood in stool and melena.  Genitourinary: Negative for dysuria.  Musculoskeletal: Negative for myalgias and neck pain.  Skin: Negative for rash.  Neurological: Negative for dizziness, tremors, sensory change and headaches.  Psychiatric/Behavioral: Negative for depression, suicidal ideas and substance abuse.    Blood pressure 151/75, pulse 54, temperature 97 F (36.1 C), height 5\' 7"  (1.702 m), weight 61.236 kg  (135 lb), SpO2 98.00%. Physical Exam  Nursing note and vitals reviewed. Constitutional: He is oriented to person, place, and time. He appears well-developed and well-nourished. He appears distressed.  HENT:  Head: Normocephalic and atraumatic.  Nose: Nose normal.  Mouth/Throat: Oropharynx is clear and moist. No oropharyngeal exudate.  Eyes: Conjunctivae and EOM are normal. Pupils are equal, round, and reactive to light. No scleral icterus.  Neck: Normal range of motion. Neck supple. No tracheal deviation present. No thyromegaly present.  Cardiovascular: Normal rate, regular rhythm and intact distal pulses.   Respiratory: Effort normal and breath sounds normal. No respiratory distress. He has no wheezes.  GI: Soft. Bowel sounds are normal. He exhibits no distension. There is no tenderness. There is no rebound.  Musculoskeletal: Normal range of motion. He exhibits no edema and no tenderness.  Neurological: He is alert and oriented to person, place, and time. No cranial nerve deficit.  Skin: Skin is warm. He is diaphoretic. No erythema.  Psychiatric: He has a normal mood and affect. His behavior is normal.  Labs reviewed; 13.6/40 h/h, plt pending, na 141, K 2.7, cr 1.6 Echo from 6/14 reviewed; EF65-70%, mild MR, mild to moderate AI ECG reviewed; inferior ST elevation/hyperacute T waves, anterior q waves (old) with ST depression and lateral ST depression  Problem List Chest Pain/Inferior STEMI Hypokalemia Coronary artery disease Chronic Kidney Disease Hypothyroidism Hypertension Dyslipidemia Thrombocytopenia ~ platelets 70s previously  Assessment/Plan 79 yo man with PMH of dyslipidemia, hypertension, chronic  kidney disease stage III, hypothyroidism, thrombocytopenia here with CAD with inferior STEMI. He received aspirin/heparin gtt in the ER, loaded with ticagrelor 180 mg in the cath lab with PCI of RCA - daily asa 81 mg; ticagrelor 90 mg bid - atorvastatin 80 mg qHS - admit to ICU to  Dr. Ellyn Hack - follow-up platelet count - repeat ECG as patient bradycardic with frequent PVCs and varying PR interval with potential heart block - no beta-blocker given bradycardia and potential heart block - continue home PPI - phase I cardiac rehabilitation - tsh, lipid panel, BNP, echocardiogram  Johnney Scarlata 10/05/2013, 12:59 AM

## 2013-10-05 NOTE — ED Provider Notes (Signed)
CSN: [id number]     Arrival date & time 10/04/13  2348 History   First MD Initiated Contact with Patient 10/04/13 2352     Chief Complaint  Patient presents with  . Code STEMI   (Consider location/radiation/quality/duration/timing/severity/associated sxs/prior Treatment) HPI 79 yo male presents to the ER from EMS with chest pain.  Patient in EMS reports his pain started about 45 minutes prior to arrival.  Pain is central, crushing.  It is associated with nausea and vomiting.  Patient has been bradycardic and diaphoretic.  Patient has history of coronary disease, but no prior stent placement.  He reports he has been having some chest pain over the last month when he takes the garbage out.  Past Medical History  Diagnosis Date  . ANEMIA, B12 DEFICIENCY 11/04/2008  . ARTHRITIS, HX OF 06/30/2007  . BACK PAIN, LUMBAR 03/07/2009  . BACK PAIN, UPPER 12/05/2010  . 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 COLONIC POLYPS, HX OF 08/25/2008  . CORONARY ARTERY DISEASE 05/22/2008  . DIVERTICULITIS, COLON 12/08/2008  . GERD 06/30/2007  . HIATAL HERNIA 08/25/2008  . HYPERKERATOSIS 10/19/2009  . HYPERLIPIDEMIA 10/18/2008  . HYPERTENSION 06/30/2007  . HYPOTHYROIDISM 10/18/2008  . Irritable bowel syndrome 12/08/2008  . OSTEOARTHRITIS 05/22/2008  . OSTEOPENIA 10/18/2008  . PEPTIC ULCER DISEASE, HX OF 06/30/2007  . RENAL FAILURE 12/06/2009  . THROMBOCYTOPENIA NOS 06/30/2007  . UNS ADVRS EFF OTH RX MEDICINAL&BIOLOGICAL SBSTNC 10/18/2008  . VITAMIN B12 DEFICIENCY 09/02/2008  . Carotid artery disease 2001    Relook cath in November 2011: 6-70% ostial circumflex OM lesion, 40-50% lesions in the LAD and RCA. Similar to 2001 catheterization.; Persantine Myoview October 2012: Lower scan no ischemia or infarction. EF 66%.  . Neck pain, chronic   . CKD (chronic kidney disease) 12/06/2011  . 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 Gallstones   . COPD (chronic obstructive pulmonary disease) 12/07/2011  . Carotid stenosis  12/07/2011  . Moderate aortic insufficiency February 2012    Echocardiogram: If greater detail percent with impaired relaxation. Mild/moderate mitral calcification with mild MR; moderate aortic regurgitation and mild sclerosis.  . Shortness of breath     Hx: of with exertion   Past Surgical History  Procedure Laterality Date  . Foot surgery      left  . Carpal tunnel release    . Hernia repair    . Total knee arthroplasty    . Carotid endarterectomy    . Amputation      left index finger -tramatic  . Vagotomy      partial gastrectomy  . Partial gastrectomy    . Corneal transplant    . Rotator cuff repair      left  . Spine surgery  1985    cervical laminectomy  . Cardiac catheterization  November 2011    40% mid LAD, 50% proximal RCA, 30-40% distal RCA, 60-70% ostial OM1. No change from 2001. Medical therapy  . Shoulder arthroscopy with rotator cuff repair and subacromial decompression Right 07/01/2013    Procedure: RIGHT SHOULDER ARTHROSCOPY WITH  SUBACROMIAL DECOMPRESSION/DISTAL CLAVICLE RESECTION/ROTATOR CUFF REPAIR ;  Surgeon: Marin Shutter, MD;  Location: [place];  Service: Orthopedics;  Laterality: Right;   Family History  Problem Relation Age of Onset  . Heart disease Sister   . Diabetes Sister    History  Substance Use Topics  . Smoking status: Former Smoker    Types: Cigarettes    Quit date: 12/23/1964  . Smokeless tobacco: Never Used  .  Alcohol Use: No    Review of Systems  Unable to perform ROS: Acuity of condition    Allergies  Amlodipine besylate; Naproxen; and Oxycodone-acetaminophen  Home Medications   Current Outpatient Rx  Name  Route  Sig  Dispense  Refill  . brinzolamide (AZOPT) 1 % ophthalmic suspension   Left Eye   Place 1 drop into the left eye every 12 (twelve) hours.         Marland Kitchen HYDROcodone-acetaminophen (NORCO/VICODIN) 5-325 MG per tablet   Oral   Take 1 tablet by mouth every 4 (four) hours as needed for pain.          Marland Kitchen  HYDROmorphone (DILAUDID) 2 MG tablet   Oral   Take 1-2 tablets (2-4 mg total) by mouth every 4 (four) hours as needed for pain.   50 tablet   0   . methocarbamol (ROBAXIN) 500 MG tablet   Oral   Take 1 tablet (500 mg total) by mouth 3 (three) times daily as needed.   30 tablet   1   . metoprolol succinate (TOPROL-XL) 50 MG 24 hr tablet   Oral   Take 50 mg by mouth daily. Take with or immediately following a meal.         . nitroGLYCERIN (NITROSTAT) 0.4 MG SL tablet   Sublingual   Place 0.4 mg under the tongue every 5 (five) minutes as needed. For chest pain.         . simvastatin (ZOCOR) 20 MG tablet   Oral   Take 20 mg by mouth daily.         . tamsulosin (FLOMAX) 0.4 MG CAPS   Oral   Take 0.4 mg by mouth daily. For 7 days. Started 07/02/13         . temazepam (RESTORIL) 15 MG capsule   Oral   Take 1 capsule (15 mg total) by mouth at bedtime as needed for sleep.   30 capsule   1   . TRAVATAN Z 0.004 % SOLN ophthalmic solution   Both Eyes   Place 1 drop into both eyes at bedtime.           BP 183/70  Pulse 54  Temp(Src) 97 F (36.1 C)  Ht 5\' 7"  (1.702 m)  Wt 135 lb (61.236 kg)  BMI 21.14 kg/m2  SpO2 98% Physical Exam  Nursing note and vitals reviewed. Constitutional: He is oriented to person, place, and time. He appears well-developed and well-nourished. He appears distressed.  Elderly frail male, in acute distress  HENT:  Head: Normocephalic and atraumatic.  Nose: Nose normal.  Mouth/Throat: Oropharynx is clear and moist.  Eyes: Conjunctivae and EOM are normal. Pupils are equal, round, and reactive to light.  Neck: Normal range of motion. Neck supple. No JVD present. No tracheal deviation present. No thyromegaly present.  Cardiovascular: Regular rhythm, normal heart sounds and intact distal pulses.  Exam reveals no gallop and no friction rub.   No murmur heard. Bradycardia  Pulmonary/Chest: Effort normal and breath sounds normal. No stridor. No  respiratory distress. He has no wheezes. He has no rales. He exhibits no tenderness.  Abdominal: Soft. Bowel sounds are normal. He exhibits no distension and no mass. There is no tenderness. There is no rebound and no guarding.  Musculoskeletal: Normal range of motion. He exhibits no edema and no tenderness.  Lymphadenopathy:    He has no cervical adenopathy.  Neurological: He is alert and oriented to person, place, and time. He  exhibits normal muscle tone. Coordination normal.  Skin: Skin is warm. No rash noted. He is diaphoretic. No erythema. No pallor.  Psychiatric: He has a normal mood and affect. His behavior is normal. Judgment and thought content normal.    ED Course  Procedures (including critical care time) Labs Review Labs Reviewed  APTT - Abnormal; Notable for the following:    aPTT 21 (*)    All other components within normal limits  CBC - Abnormal; Notable for the following:    RBC 4.16 (*)    HCT 38.6 (*)    MCHC 36.5 (*)    Platelets 115 (*)    All other components within normal limits  COMPREHENSIVE METABOLIC PANEL - Abnormal; Notable for the following:    Potassium 2.9 (*)    CO2 15 (*)    Glucose, Bld 180 (*)    BUN 26 (*)    Albumin 3.3 (*)    AST 56 (*)    GFR calc non Af Amer 48 (*)    GFR calc Af Amer 56 (*)    All other components within normal limits  CBC - Abnormal; Notable for the following:    RBC 3.79 (*)    Hemoglobin 12.5 (*)    HCT 35.2 (*)    All other components within normal limits  TROPONIN I - Abnormal; Notable for the following:    Troponin I 8.88 (*)    All other components within normal limits  PRO B NATRIURETIC PEPTIDE - Abnormal; Notable for the following:    Pro B Natriuretic peptide (BNP) 668.4 (*)    All other components within normal limits  POCT I-STAT, CHEM 8 - Abnormal; Notable for the following:    Potassium 2.7 (*)    BUN 26 (*)    Creatinine, Ser 1.60 (*)    Glucose, Bld 183 (*)    All other components within normal  limits  MRSA PCR SCREENING  PROTIME-INR  TROPONIN I  PROTIME-INR  BASIC METABOLIC PANEL  TROPONIN I  TSH  HEMOGLOBIN A1C  MAGNESIUM  LIPID PANEL  POCT I-STAT TROPONIN I   Imaging Review No results found.  EKG Interpretation     Ventricular Rate:  62 PR Interval:  180 QRS Duration: 91 QT Interval:  449 QTC Calculation: 456 R Axis:   42 Text Interpretation:  Sinus rhythm Inferior infarct, acute (RCA) Anteroseptal infarct, age indeterminate Probable RV involvement, suggest recording right precordial leads ** ** ACUTE MI / STEMI ** **          CRITICAL CARE Performed by: Kalman Drape Total critical care time: 30 min Critical care time was exclusive of separately billable procedures and treating other patients. Critical care was necessary to treat or prevent imminent or life-threatening deterioration. Critical care was time spent personally by me on the following activities: development of treatment plan with patient and/or surrogate as well as nursing, discussions with consultants, evaluation of patient's response to treatment, examination of patient, obtaining history from patient or surrogate, ordering and performing treatments and interventions, ordering and review of laboratory studies, ordering and review of radiographic studies, pulse oximetry and re-evaluation of patient's condition.   MDM   1. STEMI (ST elevation myocardial infarction)    79 yo male with onset of crushing central chest pain 45 min prior to arrival.  Pt with ST elevation in inferior leads, reciprocal changes.  CODE STEMI called.  Pt has gone emergently to the cath lab.  He received full dose aspirin,  heparin, and ntg drip.  Family updated on findings and plan    Kalman Drape, MD 10/05/13 [phone]

## 2013-10-05 NOTE — Progress Notes (Signed)
  Echocardiogram 2D Echocardiogram has been performed.  Nicholas Porter FRANCES 10/05/2013, 11:56 AM

## 2013-10-05 NOTE — ED Notes (Signed)
Pt transferred to cath lab. Alert x4. Family updated on plan of care

## 2013-10-05 NOTE — Care Management Note (Signed)
    Page 1 of 1   10/05/2013     1:52:37 PM   CARE MANAGEMENT NOTE 10/05/2013  Patient:  Nicholas Porter, Nicholas Porter   Account Number:  [id number]  Date Initiated:  10/05/2013  Documentation initiated by:  Elissa Hefty  Subjective/Objective Assessment:   adm w mi     Action/Plan:   lives w wife, pcp dr Cathlean Cower   Anticipated DC Date:     Anticipated DC Plan:        [place]  CM consult  Medication Assistance      Choice offered to / List presented to:             Status of service:   Medicare Important Message given?   (If response is "NO", the following Medicare IM given date fields will be blank) Date Medicare IM given:   Date Additional Medicare IM given:    Discharge Disposition:    Per UR Regulation:  Reviewed for med. necessity/level of care/duration of stay  If discussed at [place] of Stay Meetings, dates discussed:    Comments:  10/14 1343p debbie Kyren Knick rn,bsn pt given brilinta 30day free and copay assist card. has 65.05 per month copay. left pt assist form in chart where may be able to get assist from drug ccompany.

## 2013-10-05 NOTE — Progress Notes (Signed)
Subjective: Eating BK. Mild chest discomfort.  Objective: Vital signs in last 24 hours: Temp:  [97 F (36.1 C)-98.3 F (36.8 C)] 98.3 F (36.8 C) (10/14 0800) Pulse Rate:  [48-72] 62 (10/14 0630) Resp:  [11-22] 21 (10/14 0630) BP: (128-203)/(47-93) 141/54 mmHg (10/14 0630) SpO2:  [98 %-100 %] 100 % (10/14 0630) FiO2 (%):  [28 %] 28 % (10/14 0145) Weight:  [135 lb (61.236 kg)-139 lb 15.9 oz (63.5 kg)] 139 lb 15.9 oz (63.5 kg) (10/14 0145) Weight change:  Last BM Date: 10/04/13 Intake/Output from previous day: +394 10/13 0701 - 10/14 0700 In: 715.2 [I.V.:315.2; IV Piggyback:400] Out: 260 [Urine:260] Intake/Output this shift: Total I/O In: 61.2 [I.V.:61.2] Out: -   PE: General:Pleasant affect, NAD Skin:Warm and dry, brisk capillary refill HEENT:normocephalic, sclera clear, mucus membranes moist Neck:supple, no JVD, no bruits  Heart:S1S2 RRR with soft systolic murmur, no gallup, rub or click Lungs:clear without rales, rhonchi, or wheezes [id number], non tender, + BS, do not palpate liver spleen or masses Ext:no lower ext edema, 2+ pedal pulses, 2+ radial pulses Neuro:alert and oriented, MAE, follows commands, + facial symmetry    EKG:  Sbrady rate 49 post procedure, now SR HR 69 new Q waves in III and AVF, minimal LVH  Lab Results:  Recent Labs  10/04/13 2355 10/05/13 0004 10/05/13 0525  WBC 10.3  --  7.9  HGB 14.1 13.6 12.5*  HCT 38.6* 40.0 35.2*  PLT 115*  --  87*   BMET  Recent Labs  10/04/13 2355 10/05/13 0004 10/05/13 0525  NA 138 143 138  K 2.9* 2.7* 4.0  CL 106 110 107  CO2 15*  --  15*  GLUCOSE 180* 183* 179*  BUN 26* 26* 24*  CREATININE 1.34 1.60* 1.27  CALCIUM 8.5  --  8.4    Recent Labs  10/04/13 2356 10/05/13 0145  TROPONINI <0.30 8.88*    Lab Results  Component Value Date   CHOL 92 10/05/2013   HDL 42 10/05/2013   LDLCALC 37 10/05/2013   TRIG 64 10/05/2013   CHOLHDL 2.2 10/05/2013   Lab Results  Component Value  Date   HGBA1C 5.2 12/10/2011     Lab Results  Component Value Date   TSH 3.02 04/07/2013    Hepatic Function Panel  Recent Labs  10/04/13 2355  PROT 6.4  ALBUMIN 3.3*  AST 56*  ALT 28  ALKPHOS 104  BILITOT 0.6    Recent Labs  10/05/13 0525  CHOL 92   No results found for this basename: PROTIME,  in the last 72 hours    Studies/Results: No results found.  Medications: I have reviewed the patient's current medications. Scheduled Meds: . [START ON 10/06/2013] aspirin EC  81 mg Oral Daily  . atorvastatin  80 mg Oral q1800  . [START ON 10/06/2013] brinzolamide  1 drop Left Eye Q12H  . heparin      . heparin  5,000 Units Subcutaneous Q8H  . hydrALAZINE      . [START ON 10/06/2013] latanoprost  1 drop Both Eyes QHS  . morphine      . tamsulosin  0.4 mg Oral Daily  . [START ON 10/06/2013] Ticagrelor  90 mg Oral BID   Continuous Infusions: . sodium chloride    . sodium chloride 1 mL/kg/hr (10/05/13 0151)   PRN Meds:.acetaminophen, acetaminophen, hydrALAZINE, HYDROmorphone, methocarbamol, morphine injection, nitroGLYCERIN, nitroGLYCERIN, nitroGLYCERIN, ondansetron (ZOFRAN) IV, ondansetron (ZOFRAN) IV, temazepam  Assessment/Plan: Principal Problem:   ST  elevation myocardial infarction (STEMI) of inferior wall, emergent PCI with Promus DES to RCA Active Problems:   HYPOTHYROIDISM   HYPERTENSION   CORONARY ARTERY DISEASE, residual disease of LAD and OM1   GERD   CKD (chronic kidney disease)   COPD (chronic obstructive pulmonary disease)   Aortic regurgitation  PLAN:  Pt bled at wrist, replaced TR band. Will need to resume BB was on 50 mg daily at home of XL, HR  49 last pm now in the 60s will add lopressor at low dose.  Resumed home meds for eye drops and back pain. For echo today.  Hyperglycemia, HGBA1C in process.                                        LOS: 1 day   Time spent with pt. :30 minutes. [facility] R  Nurse  Practitioner Certified Pager [phone] 10/05/2013, 8:30 AM   Agree with note written by Cecilie Kicks RNP  S/P Inf STEMI Rx with DES radially by Dr. Martinique last night. Residual 70% mid LAD lesion. Currently pain free. Exam benign. Trop +. Will adjust meds, Tx TCU. Nl progression/fast track. Add low dose ACE_I.  Lorretta Harp 10/05/2013 8:43 AM

## 2013-10-05 NOTE — Progress Notes (Signed)
After withdrawing a total of 81mls of air in an increment of 71ml at a time, right radial access site, which was not bleeding upon withdrawal of air, lost hemostasis and patient was found with blood in the bed. Total of 7mls was injected back into compression device to achieve hemostasis.Right arm neurovas cular check was within normal limit.  Pt will  Be closely monitored.

## 2013-10-06 DIAGNOSIS — I219 Acute myocardial infarction, unspecified: Secondary | ICD-10-CM | POA: Diagnosis not present

## 2013-10-06 LAB — CBC
HCT: 32.5 % — ABNORMAL LOW (ref 39.0–52.0)
Hemoglobin: 11.6 g/dL — ABNORMAL LOW (ref 13.0–17.0)
MCH: 33.8 pg (ref 26.0–34.0)
MCV: 94.8 fL (ref 78.0–100.0)
RBC: 3.43 MIL/uL — ABNORMAL LOW (ref 4.22–5.81)
RDW: 14.6 % (ref 11.5–15.5)
WBC: 7.8 10*3/uL (ref 4.0–10.5)

## 2013-10-06 LAB — BASIC METABOLIC PANEL
BUN: 24 mg/dL — ABNORMAL HIGH (ref 6–23)
CO2: 19 mEq/L (ref 19–32)
Calcium: 8.3 mg/dL — ABNORMAL LOW (ref 8.4–10.5)
Chloride: 110 mEq/L (ref 96–112)
Glucose, Bld: 101 mg/dL — ABNORMAL HIGH (ref 70–99)
Potassium: 4 mEq/L (ref 3.5–5.1)
Sodium: 139 mEq/L (ref 135–145)

## 2013-10-06 LAB — MAGNESIUM: Magnesium: 2.1 mg/dL (ref 1.5–2.5)

## 2013-10-06 MED ORDER — METOPROLOL TARTRATE 25 MG PO TABS
25.0000 mg | ORAL_TABLET | Freq: Two times a day (BID) | ORAL | Status: DC
  Administered 2013-10-06 (×2): 25 mg via ORAL
  Filled 2013-10-06 (×3): qty 1

## 2013-10-06 MED ORDER — SODIUM CHLORIDE 0.9 % IV SOLN
INTRAVENOUS | Status: DC
  Administered 2013-10-06: 18:00:00 via INTRAVENOUS

## 2013-10-06 NOTE — Progress Notes (Signed)
Transported to 3W via wheelchair.  Ambulated in room.  Jessica,R.N. and Juanita, N.T. at bedside.  Patient awake, alert and oriented at time of transfer.

## 2013-10-06 NOTE — Progress Notes (Signed)
CARDIAC REHAB PHASE I   PRE:  Rate/Rhythm: 66SR  BP:  Supine: 151/63  Sitting:   Standing:    SaO2: 100%RA  MODE:  Ambulation: 350 ft   POST:  Rate/Rhythm: 94SR  BP:  Supine:   Sitting: 169/64  Standing:    SaO2: 97%RA 1000-1048 Pt walked 350 ft with asst x 1 with slow steady gait. Tolerated well. No CP. Assisted to recliner with call bell. Pt jumped up and started messing with dirty bed linen. Encouraged pt to sit back down. Removed bed pad. Told pt it was unsafe to get up by himself as he has an IV and monitor lines that might trip. Pt asked to let him demonstrate that he could get up. Again I discussed with pt that if he did not agree to stay in chair he would have to go back to bed. Would have to have side rail up for safety. Pt stated he finally understood what I was saying and that he would sit up and use call bell if he wanted to get up. Discussed CRP 2 and pt agreed to referral to [facility] program. Left heart healthy diet. Pt needs wife here for ed.   Graylon Good, RN BSN  10/06/2013 10:41 AM

## 2013-10-06 NOTE — Progress Notes (Signed)
Subjective: He reports having to catch his breath every once and awhile.  Objective: Vital signs in last 24 hours: Temp:  [98.3 F (36.8 C)-98.7 F (37.1 C)] 98.5 F (36.9 C) (10/15 0448) Pulse Rate:  [50-69] 63 (10/15 0700) Resp:  [14-25] 25 (10/15 0700) BP: (114-180)/(46-71) 154/52 mmHg (10/15 0448) SpO2:  [97 %-100 %] 99 % (10/15 0700) Weight:  [140 lb 10.5 oz (63.8 kg)] 140 lb 10.5 oz (63.8 kg) (10/15 0453) Last BM Date: 10/04/13  Intake/Output from previous day: 10/14 0701 - 10/15 0700 In: 1192.4 [P.O.:920; I.V.:222.4; IV Piggyback:50] Out: 1100 [Urine:1100] Intake/Output this shift:    Medications Current Facility-Administered Medications  Medication Dose Route Frequency Provider Last Rate Last Dose  . 0.9 %  sodium chloride infusion   Intravenous Continuous Kalman Drape, MD      . 0.9 %  sodium chloride infusion   Intravenous Continuous Peter M Martinique, MD      . acetaminophen (TYLENOL) tablet 650 mg  650 mg Oral Q4H PRN Peter M Martinique, MD      . acetaminophen (TYLENOL) tablet 650 mg  650 mg Oral Q4H PRN Jules Husbands, MD      . aspirin EC tablet 81 mg  81 mg Oral Daily Jules Husbands, MD      . atorvastatin (LIPITOR) tablet 80 mg  80 mg Oral q1800 Jules Husbands, MD   80 mg at 10/05/13 1721  . brinzolamide (AZOPT) 1 % ophthalmic suspension 1 drop  1 drop Left Eye Q12H Leonie Man, MD   1 drop at 10/05/13 2010  . heparin injection 5,000 Units  5,000 Units Subcutaneous Q8H Peter M Martinique, MD   5,000 Units at 10/06/13 0609  . hydrALAZINE (APRESOLINE) injection 10 mg  10 mg Intravenous Q4H PRN Jules Husbands, MD   10 mg at 10/05/13 0325  . HYDROmorphone (DILAUDID) tablet 2-4 mg  2-4 mg Oral Q4H PRN Cecilie Kicks, NP      . latanoprost (XALATAN) 0.005 % ophthalmic solution 1 drop  1 drop Both Eyes QHS Leonie Man, MD   1 drop at 10/05/13 2205  . lisinopril (PRINIVIL,ZESTRIL) tablet 2.5 mg  2.5 mg Oral Daily Cecilie Kicks, NP   2.5 mg at 10/05/13 1006  . methocarbamol  (ROBAXIN) tablet 500 mg  500 mg Oral TID PRN Cecilie Kicks, NP      . metoprolol tartrate (LOPRESSOR) tablet 12.5 mg  12.5 mg Oral BID Cecilie Kicks, NP   12.5 mg at 10/05/13 2211  . morphine 2 MG/ML injection 2 mg  2 mg Intravenous Q1H PRN Peter M Martinique, MD      . nitroGLYCERIN (NITROSTAT) SL tablet 0.4 mg  0.4 mg Sublingual Q5 min PRN Kalman Drape, MD      . nitroGLYCERIN (NITROSTAT) SL tablet 0.4 mg  0.4 mg Sublingual Q5 Min x 3 PRN Jules Husbands, MD      . nitroGLYCERIN (NITROSTAT) SL tablet 0.4 mg  0.4 mg Sublingual Q5 min PRN Cecilie Kicks, NP      . ondansetron [facility]) injection 4 mg  4 mg Intravenous Q6H PRN Peter M Martinique, MD      . ondansetron [facility]) injection 4 mg  4 mg Intravenous Q6H PRN Jules Husbands, MD   4 mg at 10/05/13 0344  . tamsulosin (FLOMAX) capsule 0.4 mg  0.4 mg Oral Daily Cecilie Kicks, NP   0.4 mg at 10/05/13 1006  . temazepam (RESTORIL) capsule 15 mg  15 mg Oral QHS  PRN Jules Husbands, MD      . Ticagrelor [facility]) tablet 90 mg  90 mg Oral BID Leonie Man, MD   90 mg at 10/05/13 2206    PE: General appearance: alert, cooperative and no distress Lungs: clear to auscultation bilaterally Heart: regular rate and rhythm, S1, S2 normal, no murmur, click, rub or gallop Extremities: No LEE Pulses: 2+ and symmetric Neurologic: Grossly normal  Lab Results:   Recent Labs  10/04/13 2355  10/05/13 0042 10/05/13 0525 10/06/13 0430  WBC 10.3  --   --  7.9 7.8  HGB 14.1  < > 11.9* 12.5* 11.6*  HCT 38.6*  < > 35.0* 35.2* 32.5*  PLT 115*  --   --  87* 84*  < > = values in this interval not displayed. BMET  Recent Labs  10/04/13 2355  10/05/13 0042 10/05/13 0525 10/06/13 0430  NA 138  < > 132* 138 139  K 2.9*  < > 2.7* 4.0 4.0  CL 106  < > 99 107 110  CO2 15*  --   --  15* 19  GLUCOSE 180*  < > 164* 179* 101*  BUN 26*  < > 23 24* 24*  CREATININE 1.34  < > 1.00 1.27 1.52*  CALCIUM 8.5  --   --  8.4 8.3*  < > = values in this interval not  displayed. PT/INR  Recent Labs  10/04/13 2355 10/05/13 0525  LABPROT 13.9 14.4  INR 1.09 1.14   Cholesterol  Recent Labs  10/05/13 0525  CHOL 92   Lipid Panel     Component Value Date/Time   CHOL 92 10/05/2013 0525   TRIG 64 10/05/2013 0525   HDL 42 10/05/2013 0525   CHOLHDL 2.2 10/05/2013 0525   VLDL 13 10/05/2013 0525   LDLCALC 37 10/05/2013 0525   Cardiac Panel (last 3 results)  Recent Labs  10/04/13 2356 10/05/13 0145 10/05/13 0740  TROPONINI <0.30 8.88* >20.00*    Assessment/Plan   Principal Problem:   ST elevation myocardial infarction (STEMI) of inferior wall, emergent PCI with Promus DES to RCA Active Problems:   HYPOTHYROIDISM   HYPERTENSION   CORONARY ARTERY DISEASE, residual disease of LAD and OM1   GERD   CKD (chronic kidney disease)   COPD (chronic obstructive pulmonary disease)   Aortic regurgitation  Plan:  SP STEMI with PCI to RCA.  ASA, lipitor, lisinopril 2.5, lopressor 12.5BID, Brilinta.  SCr increased from 1.27 to 1.52.  Recheck in AM.  Will not increase ACE today.  Increase lopressor to 25 BID.  Was on 71 of ToprolXL.  Will give a liter of NS over the next 24 hours.  Glucose better today.   Echo:  EF 55-60%, no WMA.  Mild AR and stenosis, mild to mod MR, PA pres 71mmHg..  Transfer to Telemetry.  Ambulate with Cardiac Rehab.     LOS: 2 days    HAGER, BRYAN 10/06/2013 8:27 AM  I have seen and examined the patient along with Tarri Fuller, PA.  I have reviewed the chart, notes and new data.  I agree with PA's note.  Key new complaints: feels great Key examination changes: no access site complications, no clinical signs of CHF Key new findings / data: creat 1.5, markedly increased  PLAN: IV fluids Reevaluate renal function Telemetry If he remains asymptomatic and renal function does not continue to worsen, may be ready for DC in AM.  Sanda Klein, MD, [facility] (  [id number] 10/06/2013, 8:54  AM

## 2013-10-07 ENCOUNTER — Encounter (HOSPITAL_COMMUNITY): Payer: Self-pay | Admitting: Cardiology

## 2013-10-07 ENCOUNTER — Other Ambulatory Visit: Payer: Self-pay | Admitting: Cardiology

## 2013-10-07 DIAGNOSIS — Z9861 Coronary angioplasty status: Secondary | ICD-10-CM

## 2013-10-07 DIAGNOSIS — I498 Other specified cardiac arrhythmias: Secondary | ICD-10-CM | POA: Diagnosis not present

## 2013-10-07 DIAGNOSIS — I219 Acute myocardial infarction, unspecified: Secondary | ICD-10-CM | POA: Diagnosis not present

## 2013-10-07 DIAGNOSIS — N189 Chronic kidney disease, unspecified: Secondary | ICD-10-CM

## 2013-10-07 DIAGNOSIS — I213 ST elevation (STEMI) myocardial infarction of unspecified site: Secondary | ICD-10-CM

## 2013-10-07 DIAGNOSIS — I2510 Atherosclerotic heart disease of native coronary artery without angina pectoris: Secondary | ICD-10-CM | POA: Diagnosis not present

## 2013-10-07 LAB — BASIC METABOLIC PANEL
BUN: 25 mg/dL — ABNORMAL HIGH (ref 6–23)
CO2: 19 mEq/L (ref 19–32)
GFR calc non Af Amer: 42 mL/min — ABNORMAL LOW (ref >90)
Glucose, Bld: 117 mg/dL — ABNORMAL HIGH (ref 70–99)
Potassium: 3.9 mEq/L (ref 3.5–5.1)
Sodium: 138 mEq/L (ref 135–145)

## 2013-10-07 MED ORDER — ASPIRIN 81 MG PO TBEC: 81.0000 mg | DELAYED_RELEASE_TABLET | Freq: Every day | ORAL | Status: DC

## 2013-10-07 MED ORDER — TICAGRELOR 90 MG PO TABS: 90.0000 mg | ORAL_TABLET | Freq: Two times a day (BID) | ORAL | Status: DC

## 2013-10-07 MED ORDER — ATORVASTATIN CALCIUM 80 MG PO TABS: 40.0000 mg | ORAL_TABLET | Freq: Every day | ORAL | Status: DC

## 2013-10-07 MED ORDER — METOPROLOL TARTRATE 25 MG PO TABS
25.0000 mg | ORAL_TABLET | Freq: Three times a day (TID) | ORAL | Status: DC
  Filled 2013-10-07 (×4): qty 1

## 2013-10-07 MED ORDER — METOPROLOL TARTRATE 25 MG PO TABS
25.0000 mg | ORAL_TABLET | Freq: Three times a day (TID) | ORAL | Status: DC
  Administered 2013-10-07: 25 mg via ORAL
  Filled 2013-10-07 (×4): qty 1

## 2013-10-07 MED ORDER — LISINOPRIL 2.5 MG PO TABS: 2.5000 mg | ORAL_TABLET | Freq: Every day | ORAL | Status: DC

## 2013-10-07 NOTE — Progress Notes (Signed)
CARDIAC REHAB PHASE I   PRE:  Rate/Rhythm: 90SR  BP:  Supine:   Sitting: 130/60  Standing:    SaO2:   MODE:  Ambulation: 550 ft   POST:  Rate/Rhythm: 84SR  BP:  Supine:   Sitting: 160/70  Standing:    SaO2:  1011-1043 Pt walked 550 ft with hand held asst. Gait steady. Tolerated well. Re enforced how to take NTG tablets for Cp using teach back. Gave written ex ed and encouraged pt to have wife read also. He has written diet info given also. Referring to [facility] Phase 2. Discussed brilinta use again.   Graylon Good, RN BSN  10/07/2013 10:39 AM

## 2013-10-07 NOTE — Discharge Summary (Signed)
Seen & examined.  See last PN for details.  Ready for d/c s/p Inf STEMI - PCI to RCA.  Leonie Man, MD

## 2013-10-07 NOTE — Progress Notes (Signed)
Subjective: No complaints, ready for discharge  Objective: Vital signs in last 24 hours: Temp:  [98.3 F (36.8 C)-99.1 F (37.3 C)] 98.3 F (36.8 C) (10/16 0603) Pulse Rate:  [63-74] 63 (10/16 0603) Resp:  [18] 18 (10/16 0603) BP: (130-161)/(53-78) 161/78 mmHg (10/16 0603) SpO2:  [97 %-99 %] 97 % (10/16 0603) Weight:  [146 lb 12.8 oz (66.588 kg)] 146 lb 12.8 oz (66.588 kg) (10/16 0603) Weight change: 6 lb 2.3 oz (2.788 kg) Last BM Date: 10/04/13 Intake/Output from previous day: -131, wt up from 135 on admit to 146, different scales from yesterday of 140. 10/15 0701 - 10/16 0700 In: 1043.8 [P.O.:640; I.V.:403.8] Out: 1175 [Urine:1175] Intake/Output this shift:    PE: General:Pleasant affect, NAD Skin:Warm and dry, brisk capillary refill HEENT:normocephalic, sclera clear, mucus membranes moist Neck:supple, no JVD Heart:S1S2 RRR without murmur, gallup, rub or click Lungs:clear without rales, rhonchi, or wheezes [id number], non tender, + BS, do not palpate liver spleen or masses Ext:no lower ext edema, Rt radial with ecchymosis from bleed post cath. 2+ radial pulase Neuro:alert and oriented, MAE, follows commands, + facial symmetry   Lab Results:  Recent Labs  10/05/13 0525 10/06/13 0430  WBC 7.9 7.8  HGB 12.5* 11.6*  HCT 35.2* 32.5*  PLT 87* 84*   BMET  Recent Labs  10/05/13 0525 10/06/13 0430  NA 138 139  K 4.0 4.0  CL 107 110  CO2 15* 19  GLUCOSE 179* 101*  BUN 24* 24*  CREATININE 1.27 1.52*  CALCIUM 8.4 8.3*    Recent Labs  10/05/13 0145 10/05/13 0740  TROPONINI 8.88* >20.00*    Lab Results  Component Value Date   CHOL 92 10/05/2013   HDL 42 10/05/2013   LDLCALC 37 10/05/2013   TRIG 64 10/05/2013   CHOLHDL 2.2 10/05/2013   Lab Results  Component Value Date   HGBA1C 5.3 10/05/2013     Lab Results  Component Value Date   TSH 3.036 10/05/2013    Hepatic Function Panel  Recent Labs  10/04/13 2355  PROT 6.4  ALBUMIN 3.3*    AST 56*  ALT 28  ALKPHOS 104  BILITOT 0.6    Recent Labs  10/05/13 0525  CHOL 92   No results found for this basename: PROTIME,  in the last 72 hours      Studies/Results: 2D Echo: Left ventricle: The cavity size was normal. Wall thickness was normal. Systolic function was normal. The estimated ejection fraction was in the range of 55% to 60%. Wall motion was normal; there were no regional wall motion abnormalities. - Aortic valve: There was very mild stenosis. Mild regurgitation. Valve area: 1.63cm^2(VTI). Valve area: 1.69cm^2 (Vmax). - Mitral valve: Calcified annulus. Mildly thickened leaflets . Mild to moderate regurgitation directed centrally. - Pulmonary arteries: Systolic pressure was moderately increased. PA peak pressure: 48mm Hg (  Medications: I have reviewed the patient's current medications. Scheduled Meds: . aspirin EC  81 mg Oral Daily  . atorvastatin  80 mg Oral q1800  . brinzolamide  1 drop Left Eye Q12H  . heparin  5,000 Units Subcutaneous Q8H  . latanoprost  1 drop Both Eyes QHS  . lisinopril  2.5 mg Oral Daily  . metoprolol tartrate  25 mg Oral BID  . tamsulosin  0.4 mg Oral Daily  . Ticagrelor  90 mg Oral BID   Continuous Infusions: . sodium chloride    . sodium chloride 75 mL/hr at 10/06/13 1821   PRN Meds:.acetaminophen,  acetaminophen, hydrALAZINE, HYDROmorphone, methocarbamol, morphine injection, nitroGLYCERIN, nitroGLYCERIN, nitroGLYCERIN, ondansetron (ZOFRAN) IV, ondansetron (ZOFRAN) IV, temazepam  Assessment/Plan: Principal Problem:   ST elevation myocardial infarction (STEMI) of inferior wall, emergent PCI with Promus DES to RCA Active Problems:   HYPOTHYROIDISM   HYPERTENSION   CORONARY ARTERY DISEASE, residual disease of LAD and OM1   GERD   CKD (chronic kidney disease)   COPD (chronic obstructive pulmonary disease)   Aortic regurgitation   Bradycardia, sinus, on admit-resolved   PLAN: BP elevated this AM, Cr with slight  increase.  On decreased dose of BB secondary to bradycardia on admit, on low dose ACE but hesitant to increase due to increasing Cr. Willincrease BB to every 8 hours. Allergy to norvasc. Ambulate and prob. Discharge today.  Follow up with APP/Dr Ellyn Hack.  Continue Brilinta/ASA    LOS: 3 days    [facility] R  Nurse Practitioner Certified Pager [phone] 10/07/2013, 8:04 AM   I have seen and evaluated the patient this AM along with Cecilie Kicks, NP. I agree with her findings, examination as well as impression recommendations.  He looks & feels great this AM.  NO more CP. Wrist bruise stable.  BP is a bit up -- I think we can put him back on his home BB dose for d/c.   I have ordered STAT BMP to recheck renal function since there was an upward trend.  Provided that is relatively stable, he is ready for discharge.   If renal fnx is stable, can also increase ACE-I to 5 mg.  ON DAPT & statin.  EF looks good on Echo, WM stable also.  Mild AS & mild-moderate MR that should improve.  Can see me or APP in short f/u.  If APP in ~1-2 weeks, me in ~ 6 wks.    MD Time with pt: 15 min  HARDING,DAVID W, M.D., M.S. [facility] [address] [place], [place]  [id number]  [phone] Pager # [phone] 10/07/2013 10:02 AM

## 2013-10-07 NOTE — Discharge Summary (Signed)
Physician Discharge Summary       Patient ID: Nicholas Porter MRN: [id number] DOB/AGE: 1930-07-02 79 y.o.  Admit date: 10/04/2013 Discharge date: 10/07/2013  Discharge Diagnoses:  Principal Problem:   ST elevation myocardial infarction (STEMI) of inferior wall, emergent PCI with Promus DES to RCA Active Problems:   HYPOTHYROIDISM   HYPERTENSION   CORONARY ARTERY DISEASE, residual disease of LAD and OM1   GERD   CKD (chronic kidney disease)   COPD (chronic obstructive pulmonary disease)   Aortic regurgitation   Bradycardia, sinus, on admit-resolved    Discharged Condition: good  Procedures: emergent cardiac cath by Dr. Peter Martinique Successful stenting of the mid RCA with a DES 10/05/13 by Dr. Martinique    Hospital Course: 79 yo man with PMH of hypertension, dyslipidemia, coronary artery disease, hypothyroidism, GERD who presents with substernal chest pain, 1014/14 that began after 10 pm when he was lying in bed right after going to bed. He described the pain as pressure and sharp, not improved until in the ER with SL NTG from 10 down to 6 in intensity. In the ambulance he was noted to have inferior ST elevation and Code STEMI activation. He has some associated nausea. No weight loss. No significant bleeding issues but some light bruising on his arms. He has no falls. He tells me he has been getting chest pain taking the trash out. He does note some associated SOB, particularly with lying more flat. In the ER he received a large aspirin and 4000 units heparin. His potassium was noted to be 2.7 and repletion was started with IV KCl and he was taken emergently to the cath lab. He underwent emergent cath and PCI: Final Conclusions:  1. 3 vessel obstructive CAD. 70% mid LAD, 80% OM2, 100% mid RCA.  2. Elevated LVEDP  3. Successful stenting of the mid RCA with a DES His HR was in the 40s with intitial presentation.  BB was was restarted at lower rate initially and then titrated back to home  dose.    ACE was added but will need monitoring of his renal function.  Pt did well post procedure with pk Troponin of >20.  He did develop bleeding at rt radial cath site with cath pull.  TR band was continued and currently + ecchymosis  But good pulses.  BP is elevated but with resuming home dose of Toprol it should come back down.    Pt was ambulating without chest pain or SOB on day of discharge.  He will follow up in 2 weeks.  He was seen and found stable by Dr. Ellyn Hack and discharged home.     Consults: cardiology  Significant Diagnostic Studies:  BMET    Component Value Date/Time   NA 138 10/07/2013 1027   K 3.9 10/07/2013 1027   CL 108 10/07/2013 1027   CO2 19 10/07/2013 1027   GLUCOSE 117* 10/07/2013 1027   GLUCOSE 84 10/07/2006 1055   BUN 25* 10/07/2013 1027   CREATININE 1.49* 10/07/2013 1027   CALCIUM 8.6 10/07/2013 1027   GFRNONAA 42* 10/07/2013 1027   GFRAA 49* 10/07/2013 1027    CBC    Component Value Date/Time   WBC 7.8 10/06/2013 0430   WBC 7.2 09/30/2007 1046   RBC 3.43* 10/06/2013 0430   RBC 4.14* 09/30/2007 1046   HGB 11.6* 10/06/2013 0430   HGB 14.3 09/30/2007 1046   HCT 32.5* 10/06/2013 0430   HCT 39.8 09/30/2007 1046   PLT 84* 10/06/2013 0430  PLT 125* 09/30/2007 1046   MCV 94.8 10/06/2013 0430   MCV 96.0 09/30/2007 1046   MCH 33.8 10/06/2013 0430   MCH 34.6* 09/30/2007 1046   MCHC 35.7 10/06/2013 0430   MCHC 36.0* 09/30/2007 1046   RDW 14.6 10/06/2013 0430   RDW 13.1 09/30/2007 1046   LYMPHSABS 2.3 04/07/2013 0959   LYMPHSABS 1.7 09/30/2007 1046   MONOABS 0.8 04/07/2013 0959   MONOABS 0.7 09/30/2007 1046   EOSABS 0.2 04/07/2013 0959   EOSABS 0.1 09/30/2007 1046   BASOSABS 0.0 04/07/2013 0959   BASOSABS 0.0 09/30/2007 1046   Pk troponin >20 Pro BNP 668  Total cholesterol 92 triglyceride 64, HDL 42 LDL 37 Magnesium on admission 1.5 he was given 2 g IV magnesium last magnesium level 2.1  TSH 3.06  Hgb A1C 5.3  2D Echo: Study Conclusions  -  Left ventricle: The cavity size was normal. Wall thickness was normal. Systolic function was normal. The estimated ejection fraction was in the range of 55% to 60%. Wall motion was normal; there were no regional wall motion abnormalities. - Aortic valve: There was very mild stenosis. Mild regurgitation. Valve area: 1.63cm^2(VTI). Valve area: 1.69cm^2 (Vmax). - Mitral valve: Calcified annulus. Mildly thickened leaflets . Mild to moderate regurgitation directed centrally. - Pulmonary arteries: Systolic pressure was moderately increased. PA peak pressure: 14mm Hg (S).      Discharge Exam: Blood pressure 161/78, pulse 63, temperature 98.3 F (36.8 C), temperature source Oral, resp. rate 18, height 5\' 7"  (1.702 m), weight 146 lb 12.8 oz (66.588 kg), SpO2 97.00%.   AM exam:  PE: General:Pleasant affect, NAD  Skin:Warm and dry, brisk capillary refill  HEENT:normocephalic, sclera clear, mucus membranes moist  Neck:supple, no JVD  Heart:S1S2 RRR without murmur, gallup, rub or click  Lungs:clear without rales, rhonchi, or wheezes  [id number], non tender, + BS, do not palpate liver spleen or masses  Ext:no lower ext edema, Rt radial with ecchymosis from bleed post cath. 2+ radial pulase  Neuro:alert and oriented, MAE, follows commands, + facial symmetry   Disposition: 01-Home or Self Care  Discharge Orders   Future Appointments Provider Department Dept Phone   10/20/2013 11:00 AM Cecilie Kicks, NP [facility] [phone]   11/23/2013 11:00 AM Leonie Man, MD [facility] [phone]   Future Orders Complete By Expires   Amb Referral to Cardiac Rehabilitation  As directed        Medication List    STOP taking these medications       simvastatin 20 MG tablet  Commonly known as:  ZOCOR      TAKE these medications       aspirin 81 MG EC tablet  Take 1 tablet (81 mg total) by mouth daily.     atorvastatin 80 MG tablet  Commonly known as:  LIPITOR    Take 0.5 tablets (40 mg total) by mouth daily at 6 PM.     brinzolamide 1 % ophthalmic suspension  Commonly known as:  AZOPT  Place 1 drop into the left eye every 12 (twelve) hours.     HYDROcodone-acetaminophen 5-325 MG per tablet  Commonly known as:  NORCO/VICODIN  Take 1 tablet by mouth every 4 (four) hours as needed for pain.     HYDROmorphone 2 MG tablet  Commonly known as:  DILAUDID  Take 1-2 tablets (2-4 mg total) by mouth every 4 (four) hours as needed for pain.     lisinopril 2.5 MG tablet  Commonly known as:  PRINIVIL,ZESTRIL  Take 1 tablet (2.5 mg total) by mouth daily.     methocarbamol 500 MG tablet  Commonly known as:  ROBAXIN  Take 1 tablet (500 mg total) by mouth 3 (three) times daily as needed.     metoprolol succinate 50 MG 24 hr tablet  Commonly known as:  TOPROL-XL  Take 50 mg by mouth daily. Take with or immediately following a meal.     nitroGLYCERIN 0.4 MG SL tablet  Commonly known as:  NITROSTAT  Place 0.4 mg under the tongue every 5 (five) minutes as needed. For chest pain.     tamsulosin 0.4 MG Caps capsule  Commonly known as:  FLOMAX  Take 0.4 mg by mouth daily. For 7 days. Started 07/02/13     temazepam 15 MG capsule  Commonly known as:  RESTORIL  Take 1 capsule (15 mg total) by mouth at bedtime as needed for sleep.     Ticagrelor 90 MG Tabs tablet  Commonly known as:  BRILINTA  Take 1 tablet (90 mg total) by mouth 2 (two) times daily.     TRAVATAN Z 0.004 % Soln ophthalmic solution  Generic drug:  Travoprost (BAK Free)  Place 1 drop into both eyes at bedtime.           Follow-up Information   Follow up with [facility] R, NP On 10/20/2013. (at 11:00 AM)    Specialty:  Cardiology   Contact information:   [address] [place] [address] [phone]       Follow up with Leonie Man, MD On 11/23/2013. (at 11:00AM)    Specialty:  Cardiology   Contact information:   [address] [place] [address] [phone]        Discharge Instructions: Call [facility] if any bleeding, swelling or drainage at cath site.  May shower, no tub baths for 48 hours for groin sticks.  No driving for 2 weeks.  Heart Healthy diet  Have lab work done before office visit. Have labs done on the 28th if possible on first floor of Dr. Allison Quarry building.  Do Not stop Brilinta.  Call if any chest pain or shortness of breath.   Signed: Isaiah Serge Nurse Practitioner-Certified [facility]: [facility] 10/07/2013, 12:19 PM  Time spent on discharge :45 minutes.

## 2013-10-08 ENCOUNTER — Ambulatory Visit: Payer: Medicare Other | Admitting: Internal Medicine

## 2013-10-08 ENCOUNTER — Telehealth: Payer: Self-pay

## 2013-10-08 MED ORDER — ASPIRIN 81 MG PO TBEC: 81.0000 mg | DELAYED_RELEASE_TABLET | Freq: Every day | ORAL | Status: DC

## 2013-10-08 NOTE — Telephone Encounter (Signed)
The patients wife called to inform the patient had a heart attack on Monday.  He was released from the hospital yesterday.  They have informed him to start taking ASA 81 mg daily.  The patient would like a prescription if ok with PCP sent to [id number] due to cost being less than OTC

## 2013-10-08 NOTE — Telephone Encounter (Signed)
Patient informed. 

## 2013-10-08 NOTE — Telephone Encounter (Signed)
Done per emr 

## 2013-10-20 ENCOUNTER — Ambulatory Visit (INDEPENDENT_AMBULATORY_CARE_PROVIDER_SITE_OTHER): Payer: Medicare Other | Admitting: Cardiology

## 2013-10-20 ENCOUNTER — Encounter: Payer: Self-pay | Admitting: Cardiology

## 2013-10-20 VITALS — BP 140/60 | HR 64 | Ht 66.0 in | Wt 133.6 lb

## 2013-10-20 DIAGNOSIS — E039 Hypothyroidism, unspecified: Secondary | ICD-10-CM

## 2013-10-20 DIAGNOSIS — N183 Chronic kidney disease, stage 3 unspecified: Secondary | ICD-10-CM

## 2013-10-20 DIAGNOSIS — I10 Essential (primary) hypertension: Secondary | ICD-10-CM

## 2013-10-20 DIAGNOSIS — I2510 Atherosclerotic heart disease of native coronary artery without angina pectoris: Secondary | ICD-10-CM | POA: Diagnosis not present

## 2013-10-20 DIAGNOSIS — E785 Hyperlipidemia, unspecified: Secondary | ICD-10-CM

## 2013-10-20 DIAGNOSIS — I498 Other specified cardiac arrhythmias: Secondary | ICD-10-CM

## 2013-10-20 DIAGNOSIS — I219 Acute myocardial infarction, unspecified: Secondary | ICD-10-CM

## 2013-10-20 DIAGNOSIS — I213 ST elevation (STEMI) myocardial infarction of unspecified site: Secondary | ICD-10-CM

## 2013-10-20 DIAGNOSIS — I2119 ST elevation (STEMI) myocardial infarction involving other coronary artery of inferior wall: Secondary | ICD-10-CM | POA: Diagnosis not present

## 2013-10-20 DIAGNOSIS — R001 Bradycardia, unspecified: Secondary | ICD-10-CM

## 2013-10-20 LAB — BASIC METABOLIC PANEL WITH GFR
Calcium: 9.3 mg/dL (ref 8.4–10.5)
Chloride: 112 mEq/L (ref 96–112)
Creat: 1.45 mg/dL — ABNORMAL HIGH (ref 0.50–1.35)
GFR, Est African American: 52 mL/min — ABNORMAL LOW
GFR, Est Non African American: 45 mL/min — ABNORMAL LOW
Sodium: 140 mEq/L (ref 135–145)

## 2013-10-20 MED ORDER — LISINOPRIL 5 MG PO TABS: 5.0000 mg | ORAL_TABLET | Freq: Every day | ORAL | Status: DC

## 2013-10-20 NOTE — Assessment & Plan Note (Signed)
No further chest pain, no shortness of breath.  EKG stable.

## 2013-10-20 NOTE — Progress Notes (Signed)
10/22/2013   PCP: Cathlean Cower, MD   Chief Complaint  Patient presents with  . Follow-up    post hospital visit & since D/C denies chest discomfort, SOB, edema. Eccymosis noted RL abd & L lat  breast.    Primary Cardiologist: Dr. Ellyn Hack  HPI: [id number] presents today post STEMI - Inf. Wall, with RCA occlusion undergoing PCI  To RCA with Promus DES.  He does have residual disease of 70% Mid LAD and 80% OM2.  Pt bradycardic on arrival, BB held, but HR increased and we were able to discharge on home dose BB.  His Cr was elevated with CKD-3 so we were increasing ACE slowly.  Today no complaints.  No  Chest pain, no SOB.  He has been doing work at home.  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  Pt does not like "to take it easy".   He does show me bruises he rec'd in the hospital- Lt lateral chest along ribs and his abd with Heparin injections.  His bruising of the Rt wrist cath site has resolved.10   Allergies  Allergen Reactions  . Amlodipine Besylate Hives  . Naproxen Diarrhea  . Oxycodone-Acetaminophen Other (See Comments)    REACTION: unspecified    Current Outpatient Prescriptions  Medication Sig Dispense Refill  . aspirin 81 MG EC tablet Take 1 tablet (81 mg total) by mouth daily.  90 tablet  11  . atorvastatin (LIPITOR) 80 MG tablet Take 0.5 tablets (40 mg total) by mouth daily at 6 PM.  30 tablet  6  . brinzolamide (AZOPT) 1 % ophthalmic suspension Place 1 drop into the left eye every 12 (twelve) hours.      Marland Kitchen HYDROcodone-acetaminophen (NORCO/VICODIN) 5-325 MG per tablet Take 1 tablet by mouth every 4 (four) hours as needed for pain.       Marland Kitchen HYDROmorphone (DILAUDID) 2 MG tablet Take 1-2 tablets (2-4 mg total) by mouth every 4 (four) hours as needed for pain.  50 tablet  0  . methocarbamol (ROBAXIN) 500 MG tablet Take 1 tablet (500 mg total) by mouth 3 (three) times daily as needed.  30 tablet  1  . metoprolol succinate (TOPROL-XL) 50 MG  24 hr tablet Take 50 mg by mouth daily. Take with or immediately following a meal.      . nitroGLYCERIN (NITROSTAT) 0.4 MG SL tablet Place 0.4 mg under the tongue every 5 (five) minutes as needed. For chest pain.      Marland Kitchen temazepam (RESTORIL) 15 MG capsule Take 1 capsule (15 mg total) by mouth at bedtime as needed for sleep.  30 capsule  1  . Ticagrelor (BRILINTA) 90 MG TABS tablet Take 1 tablet (90 mg total) by mouth 2 (two) times daily.  60 tablet  11  . TRAVATAN Z 0.004 % SOLN ophthalmic solution Place 1 drop into both eyes at bedtime.       Marland Kitchen lisinopril (PRINIVIL,ZESTRIL) 5 MG tablet Take 1 tablet (5 mg total) by mouth daily.  90 tablet  3   No current facility-administered medications for this visit.    Past Medical History  Diagnosis Date  . ANEMIA, B12 DEFICIENCY 11/04/2008  . ARTHRITIS, HX OF 06/30/2007  . BACK PAIN, LUMBAR 03/07/2009  . BACK PAIN, UPPER 12/05/2010  . 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 COLONIC POLYPS, HX OF 08/25/2008  . CORONARY ARTERY DISEASE 05/22/2008  . DIVERTICULITIS, COLON  12/08/2008  . GERD 06/30/2007  . HIATAL HERNIA 08/25/2008  . HYPERKERATOSIS 10/19/2009  . HYPERLIPIDEMIA 10/18/2008  . HYPERTENSION 06/30/2007  . HYPOTHYROIDISM 10/18/2008  . Irritable bowel syndrome 12/08/2008  . OSTEOARTHRITIS 05/22/2008  . OSTEOPENIA 10/18/2008  . PEPTIC ULCER DISEASE, HX OF 06/30/2007  . RENAL FAILURE 12/06/2009  . THROMBOCYTOPENIA NOS 06/30/2007  . UNS ADVRS EFF OTH RX MEDICINAL&BIOLOGICAL SBSTNC 10/18/2008  . VITAMIN B12 DEFICIENCY 09/02/2008  . Carotid artery disease 2001    Relook cath in November 2011: 6-70% ostial circumflex OM lesion, 40-50% lesions in the LAD and RCA. Similar to 2001 catheterization.; Persantine Myoview October 2012: Lower scan no ischemia or infarction. EF 66%.  . Neck pain, chronic   . 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 Gallstones   . COPD (chronic obstructive pulmonary disease) 12/07/2011  . Carotid stenosis 12/07/2011  . Moderate aortic  insufficiency February 2012    Echocardiogram: If greater detail percent with impaired relaxation. Mild/moderate mitral calcification with mild MR; moderate aortic regurgitation and mild sclerosis.  . Shortness of breath     Hx: of with exertion  . Bradycardia, sinus, on admit-resolved  10/07/2013  . CKD (chronic kidney disease), stage III   . CORONARY ARTERY DISEASE, residual disease of LAD and OM1 05/22/2008    Qualifier: Diagnosis of  By: Linda Hedges MD, Heinz Knuckles   . ST elevation myocardial infarction (STEMI) of inferior wall, emergent PCI with Promus DES to RCA 10/05/2013    Past Surgical History  Procedure Laterality Date  . Foot surgery      left  . Carpal tunnel release    . Hernia repair    . Total knee arthroplasty    . Carotid endarterectomy    . Amputation      left index finger -tramatic  . Vagotomy      partial gastrectomy  . Partial gastrectomy    . Corneal transplant    . Rotator cuff repair      left  . Spine surgery  1985    cervical laminectomy  . Cardiac catheterization  November 2011    40% mid LAD, 50% proximal RCA, 30-40% distal RCA, 60-70% ostial OM1. No change from 2001. Medical therapy  . Shoulder arthroscopy with rotator cuff repair and subacromial decompression Right 07/01/2013    Procedure: RIGHT SHOULDER ARTHROSCOPY WITH  SUBACROMIAL DECOMPRESSION/DISTAL CLAVICLE RESECTION/ROTATOR CUFF REPAIR ;  Surgeon: Marin Shutter, MD;  Location: [facility];  Service: Orthopedics;  Laterality: Right;  . Coronary angioplasty with stent placement  09/2013    Promus DES to RCA with STEMI    [id number] colds or fevers, no weight changes Skin:no rashes or ulcers, + ecchymosis of chest and abd. HEENT:no blurred vision, no congestion CV:see HPI PUL:see HPI GI:no diarrhea constipation or melena, no indigestion GU:no hematuria, no dysuria MS:no joint pain, no claudication Neuro:no syncope, no lightheadedness Endo:no diabetes, no thyroid disease  PHYSICAL EXAM BP  140/60  Pulse 64  Ht 5\' 6"  (1.676 m)  Wt 133 lb 9.6 oz (60.601 kg)  BMI 21.57 kg/m2 General:Pleasant affect, NAD Skin:Warm and dry, brisk capillary refill, Ecchymosis of Lt ant chest and Rt mid to lower abd due to heparin sub cu and something from the hospital HEENT:normocephalic, sclera clear, mucus membranes moist Neck:supple, no JVD, no bruits  Heart:S1S2 RRR without murmur, gallup, rub or click Lungs:clear without rales, rhonchi, or wheezes [id number], non tender, + BS, do not palpate liver spleen or masses Ext:no lower ext edema, 2+ pedal pulses, 2+ radial  pulses Neuro:alert and oriented, MAE, follows commands, + facial symmetry  EKG:SR HR 64 stable EKG, peaked t waves in ant leads are old.   ASSESSMENT AND PLAN ST elevation myocardial infarction (STEMI) of inferior wall, emergent PCI with Promus DES to RCA No further chest pain, no shortness of breath.  EKG stable.  CORONARY ARTERY DISEASE, residual disease of LAD and OM1 No further chest pain  HYPERLIPIDEMIA Treated  Oct 2014 with MI  TC 92; TG 64; HDL 42; LDL 37  CKD (chronic kidney disease), stage III ACE started at low dose in hospital due to renal function.  Stable Cr. Will increase lisinopril to 5 mg daily and recheck BMP in one week.  Pt to see Dr. Ellyn Hack  Dec. 2. 2014  Bradycardia, sinus, on admit-resolved  BB held initially in the hospital but HR improved and tolerates home dose of BB  HYPOTHYROIDISM Followed by PCP  HYPERTENSION Stable but will tolerate increased dose of ACE.

## 2013-10-20 NOTE — Patient Instructions (Signed)
Increase Lisinopril to 5 mg daily, you can take 2 of the 2.5 mg tabs daily until gone, then get new tabs of 5 mg.  If your Nicholas Porter is very expensive, before you buy call and ask Dr. Ellyn Hack if there is anything else you could take.  Do not stop Brilinta or asprin.  Call if any chest pain or shortness of breath or go to ER.  Heart healthy diet.  Have lab work done today.  Follow up with Dr. Ellyn Hack as scheduled in December.  Go slow with activity, do not over do.

## 2013-10-21 ENCOUNTER — Telehealth: Payer: Self-pay | Admitting: *Deleted

## 2013-10-21 ENCOUNTER — Telehealth (HOSPITAL_COMMUNITY): Payer: Self-pay | Admitting: *Deleted

## 2013-10-21 DIAGNOSIS — Z79899 Other long term (current) drug therapy: Secondary | ICD-10-CM

## 2013-10-21 NOTE — Telephone Encounter (Signed)
Spoke to patient. Result given . Verbalized understanding Per Mickel Baas NP, Recheck 1 week . Patient is aware.  Labs ordered-BMP

## 2013-10-21 NOTE — Telephone Encounter (Signed)
Message copied by Raiford Simmonds on Thu Oct 21, 2013 12:15 PM ------      Message from: Nicholas Porter      Created: Thu Oct 21, 2013  7:54 AM       Recheck BMP in 1 week.  Otherwise ok, but with increase of meds want to make sure his kidneys do ok ------

## 2013-10-22 ENCOUNTER — Encounter: Payer: Self-pay | Admitting: Cardiology

## 2013-10-22 NOTE — Assessment & Plan Note (Addendum)
Treated  Oct 2014 with MI  TC 92; TG 64; HDL 42; LDL 37

## 2013-10-22 NOTE — Assessment & Plan Note (Signed)
ACE started at low dose in hospital due to renal function.  Stable Cr. Will increase lisinopril to 5 mg daily and recheck BMP in one week.  Pt to see Dr. Ellyn Hack  Dec. 2. 2014

## 2013-10-22 NOTE — Assessment & Plan Note (Signed)
Followed by PCP

## 2013-10-22 NOTE — Assessment & Plan Note (Signed)
BB held initially in the hospital but HR improved and tolerates home dose of BB

## 2013-10-22 NOTE — Assessment & Plan Note (Signed)
Stable but will tolerate increased dose of ACE.

## 2013-10-22 NOTE — Assessment & Plan Note (Signed)
No further chest pain

## 2013-10-25 ENCOUNTER — Telehealth: Payer: Self-pay | Admitting: Cardiology

## 2013-10-25 ENCOUNTER — Encounter: Payer: Self-pay | Admitting: Cardiology

## 2013-10-25 NOTE — Telephone Encounter (Signed)
Message forwarded to K. Alvstad, PharmD to check on options for pt w/ patient assistance program.

## 2013-10-25 NOTE — Telephone Encounter (Signed)
Please call-concerning his medicine-it is too expensive.He was suppose to let you know about this medicine.

## 2013-10-25 NOTE — Telephone Encounter (Signed)
So - the Case Managers in the hospital should have discussed the $ issues with the patient.  We need to make sure that he has the Co-pay card & has gone through the appropriate channels Erasmo Downer is pretty well versed in the coverage plans).   If the copay will stll be too much despite the card & company assistance, we can convert to Plavix.  While this is being arranged, he needs to come by the office to get samples to tide him over, in order to avoid missing a day.  We can use [facility] 1-yr Plavix Rx, provided we bridge him with samples.  Leonie Man, MD

## 2013-10-25 NOTE — Telephone Encounter (Signed)
Returned call and pt verified x 2.  Pt stated he was told he has to take Brilinta twice a day for a year.  Stated he found out how much it was going to cost and it will cost $65.05 each month.  Pt wanted to know if it comes in a generic b/c his free 30-day supply will run out on the 14th (November).  Also stated doctor told him he may be able to find something a little cheaper than Brilinta.  Pt informed Brilinta does not have a generic and others like it (no blood monitoring) do not have a generic.  Pt informed Dr. Ellyn Hack will be notified to find out what alternatives there may be and RN will call back.  Pt verbalized understanding and agreed w/ plan.  Message forwarded to Dr. Ellyn Hack.

## 2013-10-26 ENCOUNTER — Telehealth: Payer: Self-pay | Admitting: Cardiology

## 2013-10-26 DIAGNOSIS — H4011X0 Primary open-angle glaucoma, stage unspecified: Secondary | ICD-10-CM | POA: Diagnosis not present

## 2013-10-26 MED ORDER — TICAGRELOR 90 MG PO TABS: 90.0000 mg | ORAL_TABLET | Freq: Two times a day (BID) | ORAL | Status: DC

## 2013-10-26 NOTE — Telephone Encounter (Signed)
He must stop every 15 min. For a break.

## 2013-10-26 NOTE — Telephone Encounter (Signed)
Per [name], pt/wife called back.  Stated pt said he already picked up the leaves.    Returned call and pt verified x 2.  Pt informed message receive and instructions given.  Pt stated he did okay with the leaves and took one bag at a time.  Pt w/o complaints at this time.

## 2013-10-26 NOTE — Telephone Encounter (Signed)
Wants to know if it is all right for him to get on his rider and get the leaves up in his yard? If not there, leave a message.

## 2013-10-26 NOTE — Telephone Encounter (Signed)
Returned call.  Left message to call back before 4pm.  

## 2013-10-26 NOTE — Telephone Encounter (Signed)
LMOM to call back

## 2013-10-26 NOTE — Telephone Encounter (Signed)
Returned call and pt informed samples left at front desk.  Pt verbalized understanding and agreed w/ plan. Pt will wait for call back from [place].

## 2013-10-26 NOTE — Telephone Encounter (Signed)
Returned Museum/gallery exhibitions officer  Advertising account planner giving samples)

## 2013-10-26 NOTE — Telephone Encounter (Signed)
Message forwarded to Dr. Ellyn Hack.

## 2013-10-27 DIAGNOSIS — Z79899 Other long term (current) drug therapy: Secondary | ICD-10-CM | POA: Diagnosis not present

## 2013-10-27 LAB — BASIC METABOLIC PANEL WITH GFR
CO2: 19 mEq/L (ref 19–32)
Calcium: 9.1 mg/dL (ref 8.4–10.5)
GFR, Est African American: 48 mL/min — ABNORMAL LOW
GFR, Est Non African American: 41 mL/min — ABNORMAL LOW
Potassium: 4.3 mEq/L (ref 3.5–5.3)
Sodium: 141 mEq/L (ref 135–145)

## 2013-10-28 ENCOUNTER — Telehealth: Payer: Self-pay | Admitting: *Deleted

## 2013-10-28 NOTE — Telephone Encounter (Signed)
Spoke with patient - his ins copay is $65 per month.  At this point he doesn't believe that will be cost prohibitive, but he will let us know if it becomes so.  I also stated that we can sample the med for him when in stock, to call 7-10 days prior so that he doesn't run out.  Pt voiced understanding.

## 2013-10-28 NOTE — Telephone Encounter (Signed)
Faxed cardiac rehab ll order on 10/26/2013

## 2013-11-01 NOTE — Telephone Encounter (Signed)
Pt. Informed of results and informed to decrease his lisinopril to 2.5 mg daily. Pt. Stated understanding of instructions

## 2013-11-02 ENCOUNTER — Encounter: Payer: Self-pay | Admitting: Internal Medicine

## 2013-11-02 ENCOUNTER — Ambulatory Visit (INDEPENDENT_AMBULATORY_CARE_PROVIDER_SITE_OTHER): Payer: Medicare Other | Admitting: Internal Medicine

## 2013-11-02 VITALS — BP 148/80 | HR 65 | Temp 97.8°F | Ht 67.0 in | Wt 142.0 lb

## 2013-11-02 DIAGNOSIS — E785 Hyperlipidemia, unspecified: Secondary | ICD-10-CM | POA: Diagnosis not present

## 2013-11-02 DIAGNOSIS — I10 Essential (primary) hypertension: Secondary | ICD-10-CM

## 2013-11-02 DIAGNOSIS — N183 Chronic kidney disease, stage 3 unspecified: Secondary | ICD-10-CM

## 2013-11-02 DIAGNOSIS — J449 Chronic obstructive pulmonary disease, unspecified: Secondary | ICD-10-CM

## 2013-11-02 MED ORDER — LISINOPRIL 5 MG PO TABS: 5.0000 mg | ORAL_TABLET | Freq: Every day | ORAL | Status: DC

## 2013-11-02 MED ORDER — METOPROLOL SUCCINATE ER 50 MG PO TB24: 50.0000 mg | ORAL_TABLET | Freq: Every day | ORAL | Status: DC

## 2013-11-02 MED ORDER — ATORVASTATIN CALCIUM 80 MG PO TABS: 40.0000 mg | ORAL_TABLET | Freq: Every day | ORAL | Status: DC

## 2013-11-02 NOTE — Assessment & Plan Note (Addendum)
.  stable overall by history and exam, recent data reviewed with pt, and pt to continue medical treatment as before,  to f/u any worsening symptoms or concerns / Lab Results  Component Value Date   LDLCALC 37 10/05/2013  Note:  Total time for pt hx, exam, review of record with pt in the room, determination of diagnoses and plan for further eval and tx is > 40 min, with over 50% spent in coordination and counseling of patient

## 2013-11-02 NOTE — Progress Notes (Addendum)
Subjective:    Patient ID: Nicholas Porter, male    DOB: 06-26-1930, 79 y.o.   MRN: [id number]  HPI  Here to f/u, had recent NSTEMI, was not taking asa prior, now on brillinta/asa, asked to decrease the ACE back to 2.5 mg. Pt denies chest pain, increased sob or doe, wheezing, orthopnea, PND, increased LE swelling, palpitations, dizziness or syncope.  Pt denies new neurological symptoms such as new headache, or facial or extremity weakness or numbness   Pt denies polydipsia, polyuria.  Due to f/u with Dr Ellyn Hack next month.  No other new complaints, Denies worsening depressive symptoms, suicidal ideation, or panic; has ongoing anxiety, not increased recently, chuckles he is turning 79yo, and happy he has so much energy. But ready to pass on if occurs. Was instructed on activity at time of d/c, but still has been doing some yard work in the past wk. Past Medical History  Diagnosis Date  . ANEMIA, B12 DEFICIENCY 11/04/2008  . ARTHRITIS, HX OF 06/30/2007  . BACK PAIN, LUMBAR 03/07/2009  . BACK PAIN, UPPER 12/05/2010  . 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 COLONIC POLYPS, HX OF 08/25/2008  . CORONARY ARTERY DISEASE 05/22/2008  . DIVERTICULITIS, COLON 12/08/2008  . GERD 06/30/2007  . HIATAL HERNIA 08/25/2008  . HYPERKERATOSIS 10/19/2009  . HYPERLIPIDEMIA 10/18/2008  . HYPERTENSION 06/30/2007  . HYPOTHYROIDISM 10/18/2008  . Irritable bowel syndrome 12/08/2008  . OSTEOARTHRITIS 05/22/2008  . OSTEOPENIA 10/18/2008  . PEPTIC ULCER DISEASE, HX OF 06/30/2007  . RENAL FAILURE 12/06/2009  . THROMBOCYTOPENIA NOS 06/30/2007  . UNS ADVRS EFF OTH RX MEDICINAL&BIOLOGICAL SBSTNC 10/18/2008  . VITAMIN B12 DEFICIENCY 09/02/2008  . Carotid artery disease 2001    Relook cath in November 2011: 6-70% ostial circumflex OM lesion, 40-50% lesions in the LAD and RCA. Similar to 2001 catheterization.; Persantine Myoview October 2012: Lower scan no ischemia or infarction. EF 66%.  . Neck pain, chronic   . Finger amputation, traumatic  12/07/2011  . Gallstones   . COPD (chronic obstructive pulmonary disease) 12/07/2011  . Carotid stenosis 12/07/2011  . Moderate aortic insufficiency February 2012    Echocardiogram: If greater detail percent with impaired relaxation. Mild/moderate mitral calcification with mild MR; moderate aortic regurgitation and mild sclerosis.  . Shortness of breath     Hx: of with exertion  . Bradycardia, sinus, on admit-resolved  10/07/2013  . CKD (chronic kidney disease), stage III   . CORONARY ARTERY DISEASE, residual disease of LAD and OM1 05/22/2008    Qualifier: Diagnosis of  By: Linda Hedges MD, Heinz Knuckles   . ST elevation myocardial infarction (STEMI) of inferior wall, emergent PCI with Promus DES to RCA 10/05/2013   Past Surgical History  Procedure Laterality Date  . Foot surgery      left  . Carpal tunnel release    . Hernia repair    . Total knee arthroplasty    . Carotid endarterectomy    . Amputation      left index finger -tramatic  . Vagotomy      partial gastrectomy  . Partial gastrectomy    . Corneal transplant    . Rotator cuff repair      left  . Spine surgery  1985    cervical laminectomy  . Cardiac catheterization  November 2011    40% mid LAD, 50% proximal RCA, 30-40% distal RCA, 60-70% ostial OM1. No change from 2001. Medical therapy  . Shoulder arthroscopy with rotator cuff repair and subacromial decompression Right 07/01/2013  Procedure: RIGHT SHOULDER ARTHROSCOPY WITH  SUBACROMIAL DECOMPRESSION/DISTAL CLAVICLE RESECTION/ROTATOR CUFF REPAIR ;  Surgeon: Marin Shutter, MD;  Location: [facility];  Service: Orthopedics;  Laterality: Right;  . Coronary angioplasty with stent placement  09/2013    Promus DES to RCA with STEMI    reports that he quit smoking about 48 years ago. His smoking use included Cigarettes. He smoked 0.00 packs per day. He has never used smokeless tobacco. He reports that he does not drink alcohol or use illicit drugs. family history includes Diabetes in his  sister; Heart disease in his sister. Allergies  Allergen Reactions  . Amlodipine Besylate Hives  . Naproxen Diarrhea  . Oxycodone-Acetaminophen Other (See Comments)    REACTION: unspecified   Current Outpatient Prescriptions on File Prior to Visit  Medication Sig Dispense Refill  . aspirin 81 MG EC tablet Take 1 tablet (81 mg total) by mouth daily.  90 tablet  11  . atorvastatin (LIPITOR) 80 MG tablet Take 0.5 tablets (40 mg total) by mouth daily at 6 PM.  30 tablet  6  . brinzolamide (AZOPT) 1 % ophthalmic suspension Place 1 drop into the left eye every 12 (twelve) hours.      Marland Kitchen HYDROcodone-acetaminophen (NORCO/VICODIN) 5-325 MG per tablet Take 1 tablet by mouth every 4 (four) hours as needed for pain.       Marland Kitchen HYDROmorphone (DILAUDID) 2 MG tablet Take 1-2 tablets (2-4 mg total) by mouth every 4 (four) hours as needed for pain.  50 tablet  0  . lisinopril (PRINIVIL,ZESTRIL) 5 MG tablet Take 1 tablet (5 mg total) by mouth daily.  90 tablet  3  . methocarbamol (ROBAXIN) 500 MG tablet Take 1 tablet (500 mg total) by mouth 3 (three) times daily as needed.  30 tablet  1  . metoprolol succinate (TOPROL-XL) 50 MG 24 hr tablet Take 50 mg by mouth daily. Take with or immediately following a meal.      . nitroGLYCERIN (NITROSTAT) 0.4 MG SL tablet Place 0.4 mg under the tongue every 5 (five) minutes as needed. For chest pain.      Marland Kitchen temazepam (RESTORIL) 15 MG capsule Take 1 capsule (15 mg total) by mouth at bedtime as needed for sleep.  30 capsule  1  . Ticagrelor (BRILINTA) 90 MG TABS tablet Take 1 tablet (90 mg total) by mouth 2 (two) times daily.  32 tablet  0  . TRAVATAN Z 0.004 % SOLN ophthalmic solution Place 1 drop into both eyes at bedtime.        No current facility-administered medications on file prior to visit.   Review of Systems  Constitutional: Negative for unexpected weight change, or unusual diaphoresis  HENT: Negative for tinnitus.   Eyes: Negative for photophobia and visual  disturbance.  Respiratory: Negative for choking and stridor.   Gastrointestinal: Negative for vomiting and blood in stool.  Genitourinary: Negative for hematuria and decreased urine volume.  Musculoskeletal: Negative for acute joint swelling Skin: Negative for color change and wound.  Neurological: Negative for tremors and numbness other than noted  Psychiatric/Behavioral: Negative for decreased concentration or  hyperactivity.       Objective:   Physical Exam BP 148/80  Pulse 65  Temp(Src) 97.8 F (36.6 C) (Oral)  Ht 5\' 7"  (1.702 m)  Wt 142 lb (64.411 kg)  BMI 22.24 kg/m2  SpO2 98% VS noted,  Constitutional: Pt appears well-developed and well-nourished.  HENT: Head: NCAT.  Right Ear: External ear normal.  Left Ear:  External ear normal.  Eyes: Conjunctivae and EOM are normal. Pupils are equal, round, and reactive to light.  Neck: Normal range of motion. Neck supple.  Cardiovascular: Normal rate and regular rhythm.   Pulmonary/Chest: Effort normal and breath sounds normal.  Abd:  Soft, NT, non-distended, + BS Neurological: Pt is alert. Not confused  Skin: Skin is warm. No erythema.  Psychiatric: Pt behavior is normal. Thought content normal. not depressed     Assessment & Plan:  Quality Measures addressed:  ACE/ARB therapy for CAD, Diabetes, and/or LVSD: pt declines further medication

## 2013-11-02 NOTE — Addendum Note (Signed)
Addended by: Sharon Seller B on: 11/02/2013 03:51 PM   Modules accepted: Orders

## 2013-11-02 NOTE — Assessment & Plan Note (Signed)
stable overall by history and exam, recent data reviewed with pt, and pt to continue medical treatment as before,  to f/u any worsening symptoms or concerns SpO2 Readings from Last 3 Encounters:  11/02/13 98%  10/07/13 97%  10/07/13 97%

## 2013-11-02 NOTE — Assessment & Plan Note (Signed)
With recent decresaed acei, volume stable,  to f/u any worsening symptoms or concerns

## 2013-11-02 NOTE — Patient Instructions (Signed)
Please continue all other medications as before, and refills have been done if requested. Please have the pharmacy call with any other refills you may need.  Please continue your efforts at being more active, low cholesterol diet, and weight control. Please continue to take all of your medications as prescribed  Please keep your appointments with your specialists as you have planned  Please remember to sign up for My Chart if you have not done so, as this will be important to you in the future with finding out test results, communicating by private email, and scheduling acute appointments online when needed.  Please return in 6 months, or sooner if needed

## 2013-11-02 NOTE — Assessment & Plan Note (Signed)
stable overall by history and exam, recent data reviewed with pt, and pt to continue medical treatment as before,  to f/u any worsening symptoms or concerns BP Readings from Last 3 Encounters:  11/02/13 148/80  10/20/13 140/60  10/07/13 161/78

## 2013-11-02 NOTE — Progress Notes (Signed)
Pre-visit discussion using our clinic review tool. No additional management support is needed unless otherwise documented below in the visit note.  

## 2013-11-23 ENCOUNTER — Encounter: Payer: Self-pay | Admitting: Cardiology

## 2013-11-23 ENCOUNTER — Ambulatory Visit (INDEPENDENT_AMBULATORY_CARE_PROVIDER_SITE_OTHER): Payer: Medicare Other | Admitting: Cardiology

## 2013-11-23 VITALS — BP 128/62 | HR 60 | Ht 67.0 in | Wt 140.8 lb

## 2013-11-23 DIAGNOSIS — I359 Nonrheumatic aortic valve disorder, unspecified: Secondary | ICD-10-CM

## 2013-11-23 DIAGNOSIS — E785 Hyperlipidemia, unspecified: Secondary | ICD-10-CM

## 2013-11-23 DIAGNOSIS — Z9861 Coronary angioplasty status: Secondary | ICD-10-CM

## 2013-11-23 DIAGNOSIS — I2119 ST elevation (STEMI) myocardial infarction involving other coronary artery of inferior wall: Secondary | ICD-10-CM

## 2013-11-23 DIAGNOSIS — Z8711 Personal history of peptic ulcer disease: Secondary | ICD-10-CM

## 2013-11-23 DIAGNOSIS — I351 Nonrheumatic aortic (valve) insufficiency: Secondary | ICD-10-CM

## 2013-11-23 DIAGNOSIS — I10 Essential (primary) hypertension: Secondary | ICD-10-CM

## 2013-11-23 DIAGNOSIS — I2510 Atherosclerotic heart disease of native coronary artery without angina pectoris: Secondary | ICD-10-CM

## 2013-11-23 DIAGNOSIS — Z955 Presence of coronary angioplasty implant and graft: Secondary | ICD-10-CM

## 2013-11-23 DIAGNOSIS — R0602 Shortness of breath: Secondary | ICD-10-CM

## 2013-11-23 MED ORDER — CLOPIDOGREL BISULFATE 75 MG PO TABS: 75.0000 mg | ORAL_TABLET | Freq: Every day | ORAL | Status: DC

## 2013-11-23 NOTE — Patient Instructions (Signed)
I am going to check a Nuclear Stress Test to evaluate the significance of the existing artery disease.  I am going to change you from Brilinta - to Plavix.  Finish your existing prescription of Brilinta then get Plavix filled instead.    I will see you back in 3 months.  Leonie Man, MD

## 2013-11-23 NOTE — Progress Notes (Signed)
PATIENT: Nicholas Porter MRN: [id number]  DOB: [birth year]   DOV:11/25/2013 PCP: Cathlean Cower, MD  Clinic Note: Chief Complaint  Patient presents with  . Follow-up    6 week follow-up:  Denies chest discomfort (per wife he had one episode after going to bed resolved quickly), occas. SOB.  No dizziness or edema.    HPI: Nicholas Porter is a 79 y.o.  male with a an extensive PMH below who presents today for second post STEMI followup. He was admitted on October 14 for inferior STEMI without haven't RCA occlusion treated with a Promus Premier DES stent. He had a relatively smooth post MI course. The plan was to treat his existing LAD and circumflex lesions medically, however he was able tolerate beta blockers due to bradycardia while inpatient.. He saw Cecilie Kicks in followup on October 29, he is relatively stable with no further episodes of chest discomfort or dyspnea, but not yet gotten back to his baseline activity level. She increased his lisinopril 5 mg daily. He should have labs pending which were not available at time of this visit. Prior to this, had been following him for moderate coronary disease as well as mild to moderate aortic regurgitation. He has had an echocardiogram performed after his STEMI that showed no regional wall motion abnormalities, and relatively stable mild to moderate aortic regurgitation with very mild aortic stenosis.  Interval History: Since his last visit, he just notes that he feels more tired and notes exertional dyspnea that he had not had. Prior to the MI. He does have episodes where he feels like his "gasping for air", but usually is exertional dyspnea. He does not have any exertional or rest chest discomfort/pressure that is consistent with his STEMI type pain. He also denies any PND, orthopnea or edema.  However his wife notes that he had one episode a week or so ago where he had some notable chest discomfort after going to bed but it resolved rapidly without  treatment.  The remainder of Cardiovascular ROS: negative for - edema, irregular heartbeat, loss of consciousness, orthopnea, palpitations, paroxysmal nocturnal dyspnea or rapid heart rate: Additional cardiac review of systems: Lightheadedness - no, dizziness - no, syncope/near-syncope - no; TIA/amaurosis fugax - no Melena - no, hematochezia no; hematuria - no; nosebleeds - yes chest this weekend he had some bloody sputum that began draining from his nose to this was shortly after a "decent" notably; claudication - no  Past Medical History  Diagnosis Date  . CAD in native artery 2001    Relook cath in November 2011: 6-70% ostial circumflex OM lesion, 40-50% lesions in the LAD and RCA. Similar to 2001 catheterization.; Persantine Myoview October 2012: Lower scan no ischemia or infarction. EF 66%.  . ST elevation myocardial infarction (STEMI) of inferior wall, emergent PCI with Promus DES to RCA 10/05/2013    100% mRCA occlusion; residual 80% OM2, ~70% LAD --> PCI to RCA  . CAD S/P percutaneous coronary angioplasty 10/05/2013    PCI to RCA - Promus Premier DES 3.0 mm x 28 mm to mRCA (~3.19 mm)  . Moderate aortic insufficiency February 2012    Echocardiogram: EF > 55%; impaired relaxation. Mild/moderate MAC with mild MR; Moderate Aortic Regurgitation and mild sclerosis/ NO stenosis; mild - mod Pulm HTN (40-105mmHg);; Follow-up 05/2013: EF 65-70%, PAP ~31 mmH, otherwise no change  . Mild aortic stenosis  10/05/2013    Echo post STEMI: EF 55-60%. No regional W. MA. Mild aortic stenosis with mild regurgitation. MAC. Moderate  pulmonary hypertension, 58 mmHg.  . Carotid artery stenosis  December 2012  . Hypertension, benign   . Hyperlipidemia LDL goal <70   . CKD (chronic kidney disease), stage III   . COPD (chronic obstructive pulmonary disease)  12/07/2011  . Thrombocytopenia, acquired 06/30/2007  . Anemia, B12 deficiency 11/04/2008  . Personal history of peptic ulcer disease 06/30/2007  . GERD  (gastroesophageal reflux disease) 06/30/2007  . Irritable bowel syndrome (IBS)  12/08/2008  . Gallstones  12/07/2011  . Hernia, hiatal  08/25/2008  . 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 History of colonic polyps 08/25/2008  . Diverticulosis of colon 12/08/2008  . Osteoarthritis 05/22/2008; 06/30/2007     with chronic upper and lumbar back pain as well as neck pain.  . Chronic back pain 12/06/2011  . Neck pain, chronic 12/06/2011  . Osteopenia 10/18/2008  . Hypothyroidism 10/18/2008  . Finger amputation, traumatic   . Personal history of TIA (transient ischemic attack) 2007    Prior Cardiac Evaluation and Past Surgical History: Past Surgical History  Procedure Laterality Date  . Carotid endarterectomy    . Amputation      left index finger -tramatic  . Cardiac catheterization  November 2011    40% mid LAD, 50% proximal RCA, 30-40% distal RCA (DOMINANT RCA with wraparound PDA & 2 large PLBs), 60-70% ostial OM1. No change from 2001. Medical therapy  . Coronary angioplasty with stent placement  09/2013    Promus DES to RCA with STEMI; residual 70% mid-distal LAD, OM 270-80%. Medical management    Allergies  Allergen Reactions  . Amlodipine Besylate Hives  . Naproxen Diarrhea  . Oxycodone-Acetaminophen Other (See Comments)    REACTION: unspecified    Current Outpatient Prescriptions  Medication Sig Dispense Refill  . aspirin 81 MG EC tablet Take 1 tablet (81 mg total) by mouth daily.  90 tablet  11  . atorvastatin (LIPITOR) 80 MG tablet Take 0.5 tablets (40 mg total) by mouth daily at 6 PM.  90 tablet  3  . brinzolamide (AZOPT) 1 % ophthalmic suspension Place 1 drop into the left eye every 12 (twelve) hours.      Marland Kitchen HYDROmorphone (DILAUDID) 2 MG tablet Take 1-2 tablets (2-4 mg total) by mouth every 4 (four) hours as needed for pain.  50 tablet  0  . lisinopril (PRINIVIL,ZESTRIL) 5 MG tablet Take 2.5 mg by mouth daily.      . methocarbamol (ROBAXIN) 500 MG tablet Take 1 tablet (500  mg total) by mouth 3 (three) times daily as needed.  30 tablet  1  . metoprolol succinate (TOPROL-XL) 50 MG 24 hr tablet Take 1 tablet (50 mg total) by mouth daily. Take with or immediately following a meal.  90 tablet  3  . nitroGLYCERIN (NITROSTAT) 0.4 MG SL tablet Place 0.4 mg under the tongue every 5 (five) minutes as needed. For chest pain.      Marland Kitchen temazepam (RESTORIL) 15 MG capsule Take 1 capsule (15 mg total) by mouth at bedtime as needed for sleep.  30 capsule  1  . TRAVATAN Z 0.004 % SOLN ophthalmic solution Place 1 drop into both eyes at bedtime.       . clopidogrel (PLAVIX) 75 MG tablet Take 1 tablet (75 mg total) by mouth daily.  30 tablet  6   No current facility-administered medications for this visit.    History   Social History Narrative   He is married with 2 children. He lives with his wife.  Very active at home, prior to a STEMI, he could walk several miles without discomfort. Prior to his MI, he would use his push mower to mow his entire lawn.   He is a former smoker and quit back in 1966. He does not drink alcohol   ROS: A comprehensive Review of Systems - Negative except Symptoms noted in history of present illness as well as below per Musculoskeletal ROS: Chronic osteoarthritis pain in the neck and back.  PHYSICAL EXAM BP 128/62  Pulse 60  Ht 5\' 7"  (1.702 m)  Wt 63.866 kg (140 lb 12.8 oz)  BMI 22.05 kg/m2 General appearance: alert, cooperative, appears stated age, no distress and Well-groomed. Well-nourished. Normal mood and affect. Answers questions appropriately Neck: no adenopathy, no JVD, supple, symmetrical, trachea midline, thyroid not enlarged, symmetric, no tenderness/mass/nodules and Soft left-sided carotid bruit. Lungs: clear to auscultation bilaterally, normal percussion bilaterally and Nonlabored, good air movement Heart: RRR, normal S1 and S2. Soft 1/6 crescendo-decrescendo early peaking SEM at RUSB. Faint diastolic murmur heard in same  location Abdomen: Soft with mild right upper quadrant and epigastric tenderness stopped sedation. NABS. No rebound Extremities: extremities normal, atraumatic, no cyanosis or edema Pulses: 2+ and symmetric Skin: Skin color, texture, turgor normal. No rashes or lesions or Just mild bruising from being on antiplatelet agents. Neurologic: Grossly normal HEENT: [place]/AT, EOMI, MMM, anicteric sclera; arcus senilis  [id number] today: No  Recent Labs: None since November. Creatinine was relatively stable at 1.55.  ASSESSMENT / PLAN: ST elevation myocardial infarction (STEMI) of inferior wall, emergent PCI with Promus DES to RCA She is doing relatively well from a pinpoint of angina. Relatively large stent postdilated well. His EF was preserved by echo with a no regional wall motion abnormalities noted. This is good news for a strong post recovery period. He should be stable for 3 month followup, however there was residual disease that needs to be monitored closely.  CORONARY ARTERY DISEASE, residual disease of LAD and OM1 He is not having angina, but he is definitely noting exertional dyspnea. It would seem that disease he has may very well be similar to previous findings as residual. However with his persistent exertional dyspnea and lack of desire to do exercise because of that, I would like to make sure there is no definite evidence of ischemia from the existing disease.  Plan: LexiScan Myoview this month. If abnormal with ischemia that would correlate with a known distribution, would consider a delayed "staged PCI ". If noT, would just simply monitor for any progression of symptoms that would suggest progressive disease. He is on statin with a dose reduced to 40 mg and 80, metoprolol as well as lisinopril with aspirin and Brilinta   Presence of drug coated stent in right coronary artery He is on Brilinta, but with some of his dyspnea symptoms this sounds atypical related to the Brilinta. He just  got the prescription filled, and is also concerned about the financial difficulties with it.  Plan: I'll switch her from Ticagrelor/Brilinta to Plavix for once his current prescription/month supply has been used.. Following that he should be fine on Plavix. We'll do a one month prescription with the least 2 refills, to ensure that he tolerates it well enough. Once he proves able to tolerate Plavix, we'll check a P2Y12 prior to considering sending off for an annual supply through [employer] on line.  HYPERLIPIDEMIA Relatively well-controlled. Can reduce back to 40 mg of Lipitor.  CKD (chronic kidney disease), stage III  He does not have a problem with oliguria, and his most recent creatinine was relatively stable. He should be fine to have his labs rechecked in roughly 6 months. Results are noted in [place], HX OF Will need to monitor closely for signs of GI bleed while he is on dual antiplatelet therapy would consider PPI if sufficient funds are available..  Aortic regurgitation There is a faint systolic murmur, but unable to really auscultated diastolic murmur of AI. He will need at least annual echo followup.  HYPERTENSION Stable on current dose of lisinopril at 2.5 mg and moderate dose of Toprol.    Orders Placed This Encounter  Procedures  . Myocardial Perfusion Imaging    Standing Status: Future     Number of Occurrences:      Standing Expiration Date: 11/23/2014    Order Specific Question:  Where should this test be performed    Answer:  [facility]    Order Specific Question:  Type of stress    Answer:  Lexiscan    Order Specific Question:  Patient weight in lbs    Answer:  140   Meds ordered this encounter  Medications  . lisinopril (PRINIVIL,ZESTRIL) 5 MG tablet    Sig: Take 2.5 mg by mouth daily.  . clopidogrel (PLAVIX) 75 MG tablet    Sig: Take 1 tablet (75 mg total) by mouth daily.    Dispense:  30 tablet    Refill:  6     Followup: 3 months or sooner if stress test abnormal.  DAVID W. Ellyn Hack, M.D., M.S. [facility] [address] [place], [place]  [id number]  [phone] Pager # [phone]

## 2013-11-24 ENCOUNTER — Encounter: Payer: Self-pay | Admitting: Cardiology

## 2013-11-25 NOTE — Assessment & Plan Note (Addendum)
He is on Brilinta, but with some of his dyspnea symptoms this sounds atypical related to the Brilinta. He just got the prescription filled, and is also concerned about the financial difficulties with it.  Plan: I'll switch her from Ticagrelor/Brilinta to Plavix for once his current prescription/month supply has been used.. Following that he should be fine on Plavix. We'll do a one month prescription with the least 2 refills, to ensure that he tolerates it well enough. Once he proves able to tolerate Plavix, we'll check a P2Y12 prior to considering sending off for an annual supply through [employer] on line.

## 2013-11-25 NOTE — Assessment & Plan Note (Signed)
He does not have a problem with oliguria, and his most recent creatinine was relatively stable. He should be fine to have his labs rechecked in roughly 6 months. Results are noted in [facility]

## 2013-11-25 NOTE — Assessment & Plan Note (Signed)
Will need to monitor closely for signs of GI bleed while he is on dual antiplatelet therapy would consider PPI if sufficient funds are available.Marland Kitchen

## 2013-11-25 NOTE — Assessment & Plan Note (Signed)
He is not having angina, but he is definitely noting exertional dyspnea. It would seem that disease he has may very well be similar to previous findings as residual. However with his persistent exertional dyspnea and lack of desire to do exercise because of that, I would like to make sure there is no definite evidence of ischemia from the existing disease.  Plan: LexiScan Myoview this month. If abnormal with ischemia that would correlate with a known distribution, would consider a delayed "staged PCI ". If noT, would just simply monitor for any progression of symptoms that would suggest progressive disease. He is on statin with a dose reduced to 40 mg and 80, metoprolol as well as lisinopril with aspirin and Brilinta

## 2013-11-25 NOTE — Assessment & Plan Note (Signed)
She is doing relatively well from a pinpoint of angina. Relatively large stent postdilated well. His EF was preserved by echo with a no regional wall motion abnormalities noted. This is good news for a strong post recovery period. He should be stable for 3 month followup, however there was residual disease that needs to be monitored closely.

## 2013-11-25 NOTE — Assessment & Plan Note (Signed)
Stable on current dose of lisinopril at 2.5 mg and moderate dose of Toprol.

## 2013-11-25 NOTE — Assessment & Plan Note (Signed)
Relatively well-controlled. Can reduce back to 40 mg of Lipitor.

## 2013-11-25 NOTE — Assessment & Plan Note (Signed)
There is a faint systolic murmur, but unable to really auscultated diastolic murmur of AI. He will need at least annual echo followup.

## 2013-11-26 ENCOUNTER — Telehealth: Payer: Self-pay | Admitting: Cardiology

## 2013-11-26 NOTE — Telephone Encounter (Signed)
Says nose has been bleeding  Started yesterday.  She also said he complained of chest tightness late yesterday pm  She says he does not look well pasty white. She very concerned about him  Please call  Was seen here a couple of days ago and now he is really seeming to go downhill

## 2013-11-26 NOTE — Telephone Encounter (Signed)
Per wife he had a nose bleed yesterday and started feeling bad last night with some chest discomfort but refused to take a NTG.  Rested for about one hour then felt better.  Today he doesn't feel well c/o being cold and sitting in his den w/his coat on.  Ask wife if he is running a fever and she said his face feels hot.  Suggested he see his PCP but he refuses to go out of the house because it is too cold.  Will give him tylenol and call Dr. Jenny Reichmann or go to ER if he starts feeling worse.

## 2013-12-01 ENCOUNTER — Ambulatory Visit (HOSPITAL_COMMUNITY)
Admission: RE | Admit: 2013-12-01 | Discharge: 2013-12-01 | Disposition: A | Discharge status: Home / Self Care | Payer: Medicare Other | Source: Ambulatory Visit | Attending: Cardiovascular Disease | Admitting: Cardiovascular Disease

## 2013-12-01 DIAGNOSIS — I2119 ST elevation (STEMI) myocardial infarction involving other coronary artery of inferior wall: Secondary | ICD-10-CM

## 2013-12-01 DIAGNOSIS — R0609 Other forms of dyspnea: Secondary | ICD-10-CM | POA: Insufficient documentation

## 2013-12-01 DIAGNOSIS — R0989 Other specified symptoms and signs involving the circulatory and respiratory systems: Secondary | ICD-10-CM | POA: Insufficient documentation

## 2013-12-01 DIAGNOSIS — I10 Essential (primary) hypertension: Secondary | ICD-10-CM

## 2013-12-01 DIAGNOSIS — R5381 Other malaise: Secondary | ICD-10-CM | POA: Insufficient documentation

## 2013-12-01 DIAGNOSIS — I739 Peripheral vascular disease, unspecified: Secondary | ICD-10-CM | POA: Insufficient documentation

## 2013-12-01 DIAGNOSIS — I2510 Atherosclerotic heart disease of native coronary artery without angina pectoris: Secondary | ICD-10-CM | POA: Diagnosis not present

## 2013-12-01 DIAGNOSIS — R0602 Shortness of breath: Secondary | ICD-10-CM | POA: Diagnosis not present

## 2013-12-01 DIAGNOSIS — I252 Old myocardial infarction: Secondary | ICD-10-CM | POA: Diagnosis not present

## 2013-12-01 DIAGNOSIS — J449 Chronic obstructive pulmonary disease, unspecified: Secondary | ICD-10-CM | POA: Diagnosis not present

## 2013-12-01 DIAGNOSIS — Z87891 Personal history of nicotine dependence: Secondary | ICD-10-CM | POA: Diagnosis not present

## 2013-12-01 DIAGNOSIS — J4489 Other specified chronic obstructive pulmonary disease: Secondary | ICD-10-CM | POA: Insufficient documentation

## 2013-12-01 DIAGNOSIS — E785 Hyperlipidemia, unspecified: Secondary | ICD-10-CM

## 2013-12-01 MED ORDER — REGADENOSON 0.4 MG/5ML IV SOLN
0.4000 mg | Freq: Once | INTRAVENOUS | Status: AC
  Administered 2013-12-01: 0.4 mg via INTRAVENOUS

## 2013-12-01 MED ORDER — AMINOPHYLLINE 25 MG/ML IV SOLN
75.0000 mg | Freq: Once | INTRAVENOUS | Status: AC
  Administered 2013-12-01: 75 mg via INTRAVENOUS

## 2013-12-01 MED ORDER — TECHNETIUM TC 99M SESTAMIBI GENERIC - CARDIOLITE
10.8000 | Freq: Once | INTRAVENOUS | Status: AC | PRN
  Administered 2013-12-01: 11 via INTRAVENOUS

## 2013-12-01 MED ORDER — TECHNETIUM TC 99M SESTAMIBI GENERIC - CARDIOLITE
29.9000 | Freq: Once | INTRAVENOUS | Status: AC | PRN
  Administered 2013-12-01: 29.9 via INTRAVENOUS

## 2013-12-01 NOTE — Procedures (Addendum)
[facility] [address] [place] [id number]  Cardiology Nuclear Med Study  Nicholas Porter is a 78 y.o. male     MRN : [id number]     DOB: August 31, 1930  Procedure Date: 12/01/2013  Nuclear Med Background Indication for Stress Test:  Stent Patency History:  COPD and CAD;MI--10/05/2013;STENT/PTCA--10/05/2013;ENDARTECTOMY;CAROTID STENOSIS;HYPERKALEMIA Cardiac Risk Factors: Family History - CAD, History of Smoking, Hypertension, Lipids and PVD  Symptoms:  DOE and Fatigue   Nuclear Pre-Procedure Caffeine/Decaff Intake:  7:00pm NPO After: 5:00am   IV Site: R Forearm  IV 0.9% NS with Angio Cath:  22g  Chest Size (in):  38"  IV Started by: Azucena Cecil, RN  Height: 5\' 7"  (1.702 m)  Cup Size: n/a  BMI:  Body mass index is 21.92 kg/(m^2). Weight:  140 lb (63.504 kg)   Tech Comments:  N/A    Nuclear Med Study 1 or 2 day study: 1 day  Stress Test Type:  [place]  Order Authorizing Provider:  Glenetta Hew, MD   Resting Radionuclide: Technetium 67m Sestamibi  Resting Radionuclide Dose: 10.8 mCi   Stress Radionuclide:  Technetium 25m Sestamibi  Stress Radionuclide Dose: 29.9 mCi           Stress Protocol Rest HR: 51 Stress HR: 79  Rest BP: 172/86 Stress BP: 187/88  Exercise Time (min): n/a METS: n/a          Dose of Adenosine (mg):  n/a Dose of Lexiscan: 0.4 mg  Dose of Atropine (mg): n/a Dose of Dobutamine: n/a mcg/kg/min (at max HR)  Stress Test Technologist: Mellody Memos, CCT Nuclear Technologist: Imagene Riches, CNMT   Rest Procedure:  Myocardial perfusion imaging was performed at rest 45 minutes following the intravenous administration of Technetium 90m Sestamibi. Stress Procedure:  The patient received IV Lexiscan 0.4 mg over 15-seconds.  Technetium 80m Sestamibi injected at 30-seconds.  Due to patient's shortness of breath, he was given IV Aminophylline 75 mg. Symptom was resolved during recovery. There  were no significant changes with Lexiscan.  Quantitative spect images were obtained after a 45 minute delay.  Transient Ischemic Dilatation (Normal <1.22):  0.99 Lung/Heart Ratio (Normal <0.45):  0.25 QGS EDV:  78 ml QGS ESV:  32 ml LV Ejection Fraction: 59%  Signed by       Rest ECG: NSR - Normal EKG  Stress ECG: No significant change from baseline ECG  QPS Raw Data Images:  Normal; no motion artifact; normal heart/lung ratio. Stress Images:  Normal homogeneous uptake in all areas of the myocardium. Rest Images:  There is decreased uptake in the inferior wall. Subtraction (SDS):  There is a fixed inferior defect that is most consistent with diaphragmatic attenuation.  Impression Exercise Capacity:  Lexiscan with no exercise. BP Response:  Normal blood pressure response. Clinical Symptoms:  No significant symptoms noted. ECG Impression:  No significant ST segment change suggestive of ischemia. Comparison with Prior Nuclear Study: New subtle infero apical ischemia  Overall Impression:  Low risk stress nuclear study Subtle infero apical ischemia vs diaphragmatic attenuation.  LV Wall Motion:  NL LV Function; NL Wall Motion   Lorretta Harp, MD  12/01/2013 1:08 PM

## 2013-12-10 ENCOUNTER — Telehealth: Payer: Self-pay | Admitting: *Deleted

## 2013-12-10 NOTE — Telephone Encounter (Signed)
Spoke to patient. Myoview Result given . Verbalized understanding  Patient had a question about taking Brilitna vs plavix. He wanted to know when to start  Plavix Per last office note 11/23/13-  Complete the bottle of BRILINTA -then start plavix 75 mg  once a day. Patient verbalized understanding.

## 2013-12-10 NOTE — Telephone Encounter (Signed)
Message copied by Raiford Simmonds on Fri Dec 10, 2013 10:48 AM ------      Message from: Leonie Man      Created: Mon Dec 06, 2013 12:59 PM       Looks like one would expect after a recent MI.  NO real sign of ischemia but + sign of prior MI with scar.        No plans to go back to cath lab.            Leonie Man, MD       ------

## 2013-12-13 ENCOUNTER — Telehealth: Payer: Self-pay | Admitting: Cardiology

## 2013-12-13 NOTE — Telephone Encounter (Signed)
Per answering service-please call-possible medication allergy.He have been itching and scratching after taking this medicine.

## 2013-12-13 NOTE — Telephone Encounter (Signed)
Returned call.  Left message to call back before 4pm.  

## 2013-12-13 NOTE — Telephone Encounter (Signed)
Returned call and informed pt per instructions by MD/PA.  Pt verbalized understanding and agreed w/ plan.  Pt will call back in two weeks to update.

## 2013-12-13 NOTE — Telephone Encounter (Signed)
Returned call and pt verified x 2 w/ pt's wife, Opal Sidles.  Stated pt called b/c he is taking atorvastatin 80 mg 1/2 tab in evening.  Stated pt has been itching like he has poison oak.  Stated pt thinks he is itching from taking the pill.  Unable to answer all of RNs questions.  Stated pt left the message w/ her work number and asked that RN call pt at home ([phone] or cell [phone].  Informed RN will call pt now.   Call to pt and verified x 2.  Pt c/o itching all over about 10 pm after taking his atorvastatin.  Stated he takes the medicine about 8pm and the itching starts about 2 hours later.  Stated he has been on this medicine since he had the heart attack and has had itching on and off since he started it.  Pt informed Dr. Ellyn Hack will be notified for further instructions.  Pt verbalized understanding and agreed w/ plan.  Message forwarded to Dr. Ellyn Hack.

## 2013-12-13 NOTE — Telephone Encounter (Signed)
Lets give a trial of holding Atorvastatin x 2 weeks.  If no more itching, he must have a reaction to something in the med (probably not the statin, but the carrier component) -- if so would convert to a different statin.   Leonie Man, MD

## 2013-12-28 DIAGNOSIS — H4011X0 Primary open-angle glaucoma, stage unspecified: Secondary | ICD-10-CM | POA: Diagnosis not present

## 2014-01-31 ENCOUNTER — Encounter: Payer: Self-pay | Admitting: Physician Assistant

## 2014-01-31 ENCOUNTER — Ambulatory Visit (INDEPENDENT_AMBULATORY_CARE_PROVIDER_SITE_OTHER): Payer: Medicare Other | Admitting: Physician Assistant

## 2014-01-31 VITALS — BP 142/62 | HR 69 | Temp 98.0°F | Ht 67.0 in | Wt 139.6 lb

## 2014-01-31 DIAGNOSIS — J329 Chronic sinusitis, unspecified: Secondary | ICD-10-CM | POA: Diagnosis not present

## 2014-01-31 MED ORDER — AMOXICILLIN-POT CLAVULANATE 875-125 MG PO TABS: 1.0000 | ORAL_TABLET | Freq: Two times a day (BID) | ORAL | Status: DC

## 2014-01-31 NOTE — Patient Instructions (Signed)
It was great meeting you today Mr.  Mr Porter!   I have sent a prescription to your pharmacy, please take as directed.  If no improvement of symptoms, please return to office.   Sinusitis Sinusitis is redness, soreness, and puffiness (inflammation) of the air pockets in the bones of your face (sinuses). The redness, soreness, and puffiness can cause air and mucus to get trapped in your sinuses. This can allow germs to grow and cause an infection.  HOME CARE   Drink enough fluids to keep your pee (urine) clear or pale yellow.  Use a humidifier in your home.  Run a hot shower to create steam in the bathroom. Sit in the bathroom with the door closed. Breathe in the steam 3 4 times a day.  Put a warm, moist washcloth on your face 3 4 times a day, or as told by your doctor.  Use salt water sprays (saline sprays) to wet the thick fluid in your nose. This can help the sinuses drain.  Only take medicine as told by your doctor. GET HELP RIGHT AWAY IF:   Your pain gets worse.  You have very bad headaches.  You are sick to your stomach (nauseous).  You throw up (vomit).  You are very sleepy (drowsy) all the time.  Your face is puffy (swollen).  Your vision changes.  You have a stiff neck.  You have trouble breathing. MAKE SURE YOU:   Understand these instructions.  Will watch your condition.  Will get help right away if you are not doing well or get worse. Document Released: 05/27/2008 Document Revised: 09/02/2012 Document Reviewed: 07/14/2012 [facility] Patient Information 2014 [place].

## 2014-01-31 NOTE — Progress Notes (Signed)
Pre visit review using our clinic review tool, if applicable. No additional management support is needed unless otherwise documented below in the visit note. ° °

## 2014-01-31 NOTE — Progress Notes (Signed)
Subjective:    Patient ID: Nicholas Porter, male    DOB: 01-21-1930, 80 y.o.   MRN: [id number]  Cough This is a new problem. The current episode started in the past 7 days. Cough characteristics: productinve of clear mucus. Associated symptoms include headaches (around sinus area), nasal congestion, postnasal drip and rhinorrhea. Pertinent negatives include no chest pain, chills, fever, myalgias, sore throat, shortness of breath or wheezing. Treatments tried: ibuprofen and Robitussin DM. His past medical history is significant for COPD.  Headache  This is a new problem. The current episode started in the past 7 days. The problem occurs intermittently. The problem has been unchanged. The pain is located in the frontal region. The pain does not radiate. The quality of the pain is described as aching. Associated symptoms include coughing, rhinorrhea and sinus pressure. Pertinent negatives include no dizziness, eye pain, facial sweating, fever, phonophobia, photophobia, scalp tenderness, sore throat or weakness. He has tried NSAIDs for the symptoms. The treatment provided no relief.    Review of Systems  Constitutional: Negative for fever, chills and appetite change.  HENT: Positive for postnasal drip, rhinorrhea and sinus pressure. Negative for sore throat and trouble swallowing.   Eyes: Negative for photophobia and pain.  Respiratory: Positive for cough. Negative for chest tightness, shortness of breath and wheezing.   Cardiovascular: Negative for chest pain.  Musculoskeletal: Negative for myalgias.  Neurological: Positive for headaches (around sinus area). Negative for dizziness and weakness.  All other systems reviewed and are negative.   Past Medical History  Diagnosis Date  . CAD in native artery 2001    Relook cath in November 2011: 6-70% ostial circumflex OM lesion, 40-50% lesions in the LAD and RCA. Similar to 2001 catheterization.; Persantine Myoview October 2012: Lower scan no  ischemia or infarction. EF 66%.  . ST elevation myocardial infarction (STEMI) of inferior wall, emergent PCI with Promus DES to RCA 10/05/2013    100% mRCA occlusion; residual 80% OM2, ~70% LAD --> PCI to RCA  . CAD S/P percutaneous coronary angioplasty 10/05/2013    PCI to RCA - Promus Premier DES 3.0 mm x 28 mm to mRCA (~3.19 mm)  . Moderate aortic insufficiency February 2012    Echocardiogram: EF > 55%; impaired relaxation. Mild/moderate MAC with mild MR; Moderate Aortic Regurgitation and mild sclerosis/ NO stenosis; mild - mod Pulm HTN (40-55mmHg);; Follow-up 05/2013: EF 65-70%, PAP ~31 mmH, otherwise no change  . Mild aortic stenosis  10/05/2013    Echo post STEMI: EF 55-60%. No regional W. MA. Mild aortic stenosis with mild regurgitation. MAC. Moderate pulmonary hypertension, 58 mmHg.  . Carotid artery stenosis  December 2012  . Hypertension, benign   . Hyperlipidemia LDL goal <70   . CKD (chronic kidney disease), stage III   . COPD (chronic obstructive pulmonary disease)  12/07/2011  . Thrombocytopenia, acquired 06/30/2007  . Anemia, B12 deficiency 11/04/2008  . Personal history of peptic ulcer disease 06/30/2007  . GERD (gastroesophageal reflux disease) 06/30/2007  . Irritable bowel syndrome (IBS)  12/08/2008  . Gallstones  12/07/2011  . Hernia, hiatal  08/25/2008  . 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 History of colonic polyps 08/25/2008  . Diverticulosis of colon 12/08/2008  . Osteoarthritis 05/22/2008; 06/30/2007     with chronic upper and lumbar back pain as well as neck pain.  . Chronic back pain 12/06/2011  . Neck pain, chronic 12/06/2011  . Osteopenia 10/18/2008  . Hypothyroidism 10/18/2008  . Finger amputation, traumatic   . Personal  history of TIA (transient ischemic attack) 2007        Objective:   Physical Exam  Vitals reviewed. Constitutional: He is oriented to person, place, and time. He appears well-developed and well-nourished. No distress.  HENT:  Head:  Normocephalic and atraumatic.  Right Ear: Tympanic membrane, external ear and ear canal normal.  Left Ear: Tympanic membrane, external ear and ear canal normal.  Nose: Rhinorrhea present. Right sinus exhibits frontal sinus tenderness. Right sinus exhibits no maxillary sinus tenderness. Left sinus exhibits frontal sinus tenderness. Left sinus exhibits no maxillary sinus tenderness.  Mouth/Throat: Uvula is midline and mucous membranes are normal. He has dentures.  Post nasal drainage noted  Eyes: Conjunctivae are normal.  Neck: Normal range of motion.  Cardiovascular: Normal rate, regular rhythm and normal heart sounds.  Exam reveals no gallop and no friction rub.   No murmur heard. Pulmonary/Chest: Effort normal and breath sounds normal.  Abdominal: Soft. There is no tenderness.  Musculoskeletal: Normal range of motion.  Lymphadenopathy:    He has no cervical adenopathy.       Right: No supraclavicular adenopathy present.       Left: No supraclavicular adenopathy present.  Neurological: He is alert and oriented to person, place, and time.  Skin: Skin is warm and dry.  Psychiatric: He has a normal mood and affect.   Filed Vitals:   01/31/14 1442  BP: 142/62  Pulse: 69  Temp: 98 F (36.7 C)     Lab Results  Component Value Date   WBC 7.8 10/06/2013   HGB 11.6* 10/06/2013   HCT 32.5* 10/06/2013   PLT 84* 10/06/2013   GLUCOSE 95 10/27/2013   CHOL 92 10/05/2013   TRIG 64 10/05/2013   HDL 42 10/05/2013   LDLCALC 37 10/05/2013   ALT 28 10/04/2013   AST 56* 10/04/2013   NA 141 10/27/2013   K 4.3 10/27/2013   CL 113* 10/27/2013   CREATININE 1.55* 10/27/2013   BUN 30* 10/27/2013   CO2 19 10/27/2013   TSH 3.036 10/05/2013   PSA 5.66* 10/19/2009   INR 1.14 10/05/2013   HGBA1C 5.3 10/05/2013       Assessment & Plan:  Sinusitis:  Rx for Augmentin 875 mg bid x 10 days, can continue use of Robitussin and Ibuprofen for symptom treatment RTO if no improvement of symptoms

## 2014-02-01 ENCOUNTER — Ambulatory Visit: Payer: Medicare Other | Admitting: Internal Medicine

## 2014-03-29 DIAGNOSIS — H02059 Trichiasis without entropian unspecified eye, unspecified eyelid: Secondary | ICD-10-CM | POA: Diagnosis not present

## 2014-05-11 DIAGNOSIS — H4011X0 Primary open-angle glaucoma, stage unspecified: Secondary | ICD-10-CM | POA: Diagnosis not present

## 2014-06-02 ENCOUNTER — Telehealth: Payer: Self-pay | Admitting: Cardiology

## 2014-06-02 MED ORDER — NITROGLYCERIN 0.4 MG SL SUBL: 0.4000 mg | SUBLINGUAL_TABLET | SUBLINGUAL | Status: DC | PRN

## 2014-06-02 NOTE — Telephone Encounter (Signed)
Called spoke to patients wife. She states patient used NTG X1  Last night about 1-2 am with relief. Wife states this is the second time this week patient had to use NTG. RN e-sent prescription to pharmacy and discuss with Dr Percival Spanish RN made an appointment 06/03/14 at [facility] to wife. She states patient can not make appointment. Reschedule to 06/07/14 at 11 am.  RN instruction given to wife if patient chest discomfort is not relieved by NTG X 3 go to the closest ER. Wife verbalized understanding. She is aware that patient also has appointment 06/14/14 with Dr Ellyn Hack.

## 2014-06-02 NOTE — Telephone Encounter (Signed)
Is wanting to know if Dr.Harding has to refill his nitro or can they just go to the pharmacy for a refill.. Please call at [phone] she is there until 10am or can leave a message on home number .Marland Kitchen  Thanks

## 2014-06-03 ENCOUNTER — Ambulatory Visit: Payer: Medicare Other | Admitting: Cardiology

## 2014-06-07 ENCOUNTER — Telehealth: Payer: Self-pay | Admitting: Cardiology

## 2014-06-07 ENCOUNTER — Ambulatory Visit
Admission: RE | Admit: 2014-06-07 | Discharge: 2014-06-07 | Disposition: A | Discharge status: Home / Self Care | Payer: Medicare Other | Source: Ambulatory Visit | Attending: Cardiology | Admitting: Cardiology

## 2014-06-07 ENCOUNTER — Ambulatory Visit (INDEPENDENT_AMBULATORY_CARE_PROVIDER_SITE_OTHER): Payer: Medicare Other | Admitting: Cardiology

## 2014-06-07 ENCOUNTER — Encounter: Payer: Self-pay | Admitting: Cardiology

## 2014-06-07 ENCOUNTER — Other Ambulatory Visit: Payer: Self-pay | Admitting: *Deleted

## 2014-06-07 ENCOUNTER — Encounter: Payer: Self-pay | Admitting: Cardiovascular Disease

## 2014-06-07 ENCOUNTER — Telehealth: Payer: Self-pay | Admitting: *Deleted

## 2014-06-07 VITALS — BP 104/50 | HR 61 | Ht 67.0 in | Wt 139.0 lb

## 2014-06-07 DIAGNOSIS — Z01818 Encounter for other preprocedural examination: Secondary | ICD-10-CM

## 2014-06-07 DIAGNOSIS — N183 Chronic kidney disease, stage 3 unspecified: Secondary | ICD-10-CM

## 2014-06-07 DIAGNOSIS — R5383 Other fatigue: Secondary | ICD-10-CM

## 2014-06-07 DIAGNOSIS — I951 Orthostatic hypotension: Secondary | ICD-10-CM

## 2014-06-07 DIAGNOSIS — R079 Chest pain, unspecified: Secondary | ICD-10-CM | POA: Diagnosis not present

## 2014-06-07 DIAGNOSIS — E785 Hyperlipidemia, unspecified: Secondary | ICD-10-CM

## 2014-06-07 DIAGNOSIS — R5381 Other malaise: Secondary | ICD-10-CM

## 2014-06-07 DIAGNOSIS — Z01810 Encounter for preprocedural cardiovascular examination: Secondary | ICD-10-CM | POA: Diagnosis not present

## 2014-06-07 DIAGNOSIS — R001 Bradycardia, unspecified: Secondary | ICD-10-CM

## 2014-06-07 DIAGNOSIS — D689 Coagulation defect, unspecified: Secondary | ICD-10-CM

## 2014-06-07 DIAGNOSIS — I200 Unstable angina: Secondary | ICD-10-CM

## 2014-06-07 DIAGNOSIS — I498 Other specified cardiac arrhythmias: Secondary | ICD-10-CM

## 2014-06-07 DIAGNOSIS — I2510 Atherosclerotic heart disease of native coronary artery without angina pectoris: Secondary | ICD-10-CM | POA: Diagnosis not present

## 2014-06-07 LAB — BASIC METABOLIC PANEL
BUN: 45 mg/dL — AB (ref 6–23)
CO2: 16 mEq/L — ABNORMAL LOW (ref 19–32)
CREATININE: 1.99 mg/dL — AB (ref 0.50–1.35)
Calcium: 9.2 mg/dL (ref 8.4–10.5)
Chloride: 112 mEq/L (ref 96–112)
Glucose, Bld: 85 mg/dL (ref 70–99)
Potassium: 4.7 mEq/L (ref 3.5–5.3)
Sodium: 141 mEq/L (ref 135–145)

## 2014-06-07 LAB — TSH: TSH: 3.709 u[IU]/mL (ref 0.350–4.500)

## 2014-06-07 LAB — CBC
HCT: 37.7 % — ABNORMAL LOW (ref 39.0–52.0)
Hemoglobin: 13.1 g/dL (ref 13.0–17.0)
MCH: 31.3 pg (ref 26.0–34.0)
MCHC: 34.7 g/dL (ref 30.0–36.0)
MCV: 90.2 fL (ref 78.0–100.0)
Platelets: 113 10*3/uL — ABNORMAL LOW (ref 150–400)
RBC: 4.18 MIL/uL — ABNORMAL LOW (ref 4.22–5.81)
RDW: 15.5 % (ref 11.5–15.5)
WBC: 8.1 10*3/uL (ref 4.0–10.5)

## 2014-06-07 LAB — PROTIME-INR
INR: 1.06 (ref <1.50)
PROTHROMBIN TIME: 13.7 s (ref 11.6–15.2)

## 2014-06-07 LAB — APTT: aPTT: 28 seconds (ref 24–37)

## 2014-06-07 NOTE — Telephone Encounter (Signed)
Labs printed and handed to [place] ingold NP

## 2014-06-07 NOTE — Assessment & Plan Note (Signed)
Check labs stat today.

## 2014-06-07 NOTE — Telephone Encounter (Signed)
Please cal,wants to talk to a nurse concerning his Cath scheduled for tomorrow morning.

## 2014-06-07 NOTE — Telephone Encounter (Signed)
Left message for patient with instructions to take asa 81mg  once daily - take dose tonite and tomorrow AM

## 2014-06-07 NOTE — Assessment & Plan Note (Signed)
2 episodes of unstable angina last week relief with nitroglycerin has been very weak and tired since his episodes. Discuss with his cardiologist Dr. Ellyn Hack plan will be for cardiac catheterization tomorrow patient and his wife are agreeable to this plan. Discussed 1% chance of stroke heart attack or death for procedure.  Patient has not been taking his aspirin we will ask him to resume 81 mg daily

## 2014-06-07 NOTE — Assessment & Plan Note (Signed)
Pt with mild dizziness and does have mild drop in BP, no med changes were done will need to treat angina first.

## 2014-06-07 NOTE — Telephone Encounter (Signed)
RN called patient with lab results. Instructed to hold lisinopril tomorrow AM and take ASA 81 tonite and tomorrow and will continue to take post-cath. Instructed patient to arrive at [facility] at [place] for IV hydration prior to afternoon LHC. Hospital has been notified. Recheck BMET at noon, prior to cath. Patient aware. Voiced understanding.

## 2014-06-07 NOTE — Assessment & Plan Note (Signed)
Cardiac cath 2014: Left mainstem: Calcified, normal.  Left anterior descending (LAD): 70% mid LAD immediately after the first diagonal.  Left circumflex (LCx): 30% proximal. 80% proximal OM2  Right coronary artery (RCA): 30% ostial. 100% mid RCA with TIMI 0 flow.  Left ventriculography: Not done  Stent to 100% mid RCA with Promus DES Last nuc study 11/2013 with subtle inf. Apical ischemia vs. Diaphragmatic attenuation

## 2014-06-07 NOTE — Progress Notes (Signed)
06/07/2014   PCP: Cathlean Cower, MD   Chief Complaint  Patient presents with  . 6 month visit    reports chest pain (when goes to bed) - takes NTGx1 with relief x2 doses in a week; reports shortness of breath while walking/getting garbage; reports lightheadedness/dizziness; reports change in voice (is hoarse);     Primary Cardiologist: Dr. Roni Bread   HPI:  Nicholas Porter is a 80 y.o. male with a an extensive PMH below who presents today for chest pain.   He was admitted on October 14 for inferior STEMI with RCA occlusion treated with a Promus Premier DES stent. He had a relatively smooth post MI course. The plan was to treat his existing LAD and circumflex lesions medically, however he was not able tolerate beta blockers due to bradycardia while inpatient..   Prior to the Oct. STEMI, we had been following him for moderate coronary disease as well as mild to moderate aortic regurgitation. He has had an echocardiogram performed after his STEMI that showed no regional wall motion abnormalities, and relatively stable mild to moderate aortic regurgitation with very mild aortic stenosis.  By December he was short of breath and his Proventil was stopped and patient was placed on Plavix.  He had a LexiScan Myoview which revealed low risk nuclear study with subtle inferior apical ischemia versus diaphragmatic attenuation and normal LV function.    That time the patient's dyspnea with exertion has increased now after walking to the mailbox and back to his house he has to lie down to catch his breath. Last week once he was going to bed developed midsternal chest pressure with shortness of breath he got up walked around and increase the discomfort one nitroglycerin with relief. The next night he did well but on the second night he again had similar symptoms and again it was relieved with one nitroglycerin sublingual.  Since that time he has been very fatigued.  EKG was without acute  changes.  His other complaints are some dizziness with minimal drop in blood pressure going from lying to standing and a change in his voice. In the past he went 4 months without any voice and workup by ENT did not find a source.     Cath 10/05/13 Left mainstem: Calcified, normal.  Left anterior descending (LAD): 70% mid LAD immediately after the first diagonal.  Left circumflex (LCx): 30% proximal. 80% proximal OM2  Right coronary artery (RCA): 30% ostial. 100% mid RCA with TIMI 0 flow.  Left ventriculography: Not done  Stent to 100% mid RCA with Promus DES  Allergies  Allergen Reactions  . Amlodipine Besylate Hives  . Naproxen Diarrhea  . Oxycodone-Acetaminophen Other (See Comments)    REACTION: unspecified    Current Outpatient Prescriptions  Medication Sig Dispense Refill  . atorvastatin (LIPITOR) 80 MG tablet Take 0.5 tablets (40 mg total) by mouth daily at 6 PM.  90 tablet  3  . brinzolamide (AZOPT) 1 % ophthalmic suspension Place 1 drop into the left eye every 12 (twelve) hours.      . clopidogrel (PLAVIX) 75 MG tablet Take 1 tablet (75 mg total) by mouth daily.  30 tablet  6  . lisinopril (PRINIVIL,ZESTRIL) 5 MG tablet Take 2.5 mg by mouth daily.      . metoprolol succinate (TOPROL-XL) 50 MG 24 hr tablet Take 1 tablet (50 mg total) by mouth daily. Take with or immediately following a meal.  90 tablet  3  . nitroGLYCERIN (NITROSTAT) 0.4 MG SL tablet Place 1 tablet (0.4 mg total) under the tongue every 5 (five) minutes as needed. For chest pain.  25 tablet  6  . temazepam (RESTORIL) 15 MG capsule Take 1 capsule (15 mg total) by mouth at bedtime as needed for sleep.  30 capsule  1  . TRAVATAN Z 0.004 % SOLN ophthalmic solution Place 1 drop into both eyes at bedtime.        No current facility-administered medications for this visit.    Past Medical History  Diagnosis Date  . CAD in native artery 2001    Relook cath in November 2011: 6-70% ostial circumflex OM lesion, 40-50%  lesions in the LAD and RCA. Similar to 2001 catheterization.; Persantine Myoview October 2012: Lower scan no ischemia or infarction. EF 66%.  . ST elevation myocardial infarction (STEMI) of inferior wall, emergent PCI with Promus DES to RCA 10/05/2013    100% mRCA occlusion; residual 80% OM2, ~70% LAD --> PCI to RCA  . CAD S/P percutaneous coronary angioplasty 10/05/2013    PCI to RCA - Promus Premier DES 3.0 mm x 28 mm to mRCA (~3.19 mm)  . Moderate aortic insufficiency February 2012    Echocardiogram: EF > 55%; impaired relaxation. Mild/moderate MAC with mild MR; Moderate Aortic Regurgitation and mild sclerosis/ NO stenosis; mild - mod Pulm HTN (40-36mmHg);; Follow-up 05/2013: EF 65-70%, PAP ~31 mmH, otherwise no change  . Mild aortic stenosis  10/05/2013    Echo post STEMI: EF 55-60%. No regional W. MA. Mild aortic stenosis with mild regurgitation. MAC. Moderate pulmonary hypertension, 58 mmHg.  . Carotid artery stenosis  December 2012  . Hypertension, benign   . Hyperlipidemia LDL goal <70   . CKD (chronic kidney disease), stage III   . COPD (chronic obstructive pulmonary disease)  12/07/2011  . Thrombocytopenia, acquired 06/30/2007  . Anemia, B12 deficiency 11/04/2008  . Personal history of peptic ulcer disease 06/30/2007  . GERD (gastroesophageal reflux disease) 06/30/2007  . Irritable bowel syndrome (IBS)  12/08/2008  . Gallstones  12/07/2011  . Hernia, hiatal  08/25/2008  . 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 History of colonic polyps 08/25/2008  . Diverticulosis of colon 12/08/2008  . Osteoarthritis 05/22/2008; 06/30/2007     with chronic upper and lumbar back pain as well as neck pain.  . Chronic back pain 12/06/2011  . Neck pain, chronic 12/06/2011  . Osteopenia 10/18/2008  . Hypothyroidism 10/18/2008  . Finger amputation, traumatic   . Personal history of TIA (transient ischemic attack) 2007    Past Surgical History  Procedure Laterality Date  . Foot surgery      left    . Carpal tunnel release    . Hernia repair    . Total knee arthroplasty    . Carotid endarterectomy  2007  . Amputation      left index finger -tramatic  . Vagotomy      partial gastrectomy  . Partial gastrectomy    . Corneal transplant    . Rotator cuff repair      left  . Spine surgery  1985    cervical laminectomy  . Cardiac catheterization  November 2011    40% mid LAD, 50% proximal RCA, 30-40% distal RCA (DOMINANT RCA with wraparound PDA & 2 large PLBs), 60-70% ostial OM1. No change from 2001. Medical therapy  . Shoulder arthroscopy with rotator cuff repair and subacromial decompression Right 07/01/2013    Procedure: RIGHT SHOULDER ARTHROSCOPY WITH  SUBACROMIAL DECOMPRESSION/DISTAL CLAVICLE RESECTION/ROTATOR CUFF REPAIR ;  Surgeon: Marin Shutter, MD;  Location: [facility];  Service: Orthopedics;  Laterality: Right;  . Coronary angioplasty with stent placement  09/2013    Promus DES to RCA with STEMI; residual 70% mid-distal LAD, OM 270-80%. Medical management    [id number] colds or fevers, no weight changes, + weakness Skin:no rashes or ulcers HEENT:no blurred vision, no congestion, voice is not his usual tone CV:see HPI PUL:see HPI GI:no diarrhea constipation or melena, no indigestion GU:no hematuria, no dysuria MS:no joint pain, no claudication Neuro:no syncope, + lightheadedness at times Endo:no diabetes, no thyroid disease  Wt Readings from Last 3 Encounters:  06/07/14 139 lb (63.05 kg)  01/31/14 139 lb 9.6 oz (63.322 kg)  12/01/13 140 lb (63.504 kg)    PHYSICAL EXAM BP 104/50  Pulse 61  Ht 5\' 7"  (1.702 m)  Wt 139 lb (63.05 kg)  BMI 21.77 kg/m2 General:Pleasant affect, NAD Skin:Warm and dry, brisk capillary refill HEENT:normocephalic, sclera clear, mucus membranes moist Neck:supple, no JVD, no bruits, no adenopathy  Heart:S1S2 RRR with soft systolic murmur, gallup, rub or click Lungs:clear without rales, rhonchi, or wheezes [id number], non tender, + BS, do  not palpate liver spleen or masses Ext:no lower ext edema, 2+ pedal pulses, 2+ radial pulses Neuro:alert and oriented, MAE, follows commands, + facial symmetry  Orthostatic BP lying:  148/67, sitting 139/64;  Standing 127/65, standing for 3 min 140/63  EKG: SR mild LVH peaked T waves V2-3 but no changes from 10/25/13  ASSESSMENT AND PLAN Unstable angina, relief with NTG 2 episodes of unstable angina last week relief with nitroglycerin has been very weak and tired since his episodes. Discuss with his cardiologist Dr. Ellyn Hack plan will be for cardiac catheterization tomorrow patient and his wife are agreeable to this plan. Discussed 1% chance of stroke heart attack or death for procedure.  Patient has not been taking his aspirin we will ask him to resume 81 mg daily  CAD (coronary artery disease) Cardiac cath 2014: Left mainstem: Calcified, normal.  Left anterior descending (LAD): 70% mid LAD immediately after the first diagonal.  Left circumflex (LCx): 30% proximal. 80% proximal OM2  Right coronary artery (RCA): 30% ostial. 100% mid RCA with TIMI 0 flow.  Left ventriculography: Not done  Stent to 100% mid RCA with Promus DES Last nuc study 11/2013 with subtle inf. Apical ischemia vs. Diaphragmatic attenuation   Bradycardia, sinus, on admit-resolved  BB stopped  CKD (chronic kidney disease), stage III Check labs stat today.  HYPERLIPIDEMIA 09/2013 LDL=37, HDL =42; T chol = 92  Orthostatic hypotension Pt with mild dizziness and does have mild drop in BP, no med changes were done will need to treat angina first.

## 2014-06-07 NOTE — Assessment & Plan Note (Signed)
BB stopped

## 2014-06-07 NOTE — Patient Instructions (Addendum)
Your physician has requested that you have a cardiac catheterization. Cardiac catheterization is used to diagnose and/or treat various heart conditions. Doctors may recommend this procedure for a number of different reasons. The most common reason is to evaluate chest pain. Chest pain can be a symptom of coronary artery disease (CAD), and cardiac catheterization can show whether plaque is narrowing or blocking your heart's arteries. This procedure is also used to evaluate the valves, as well as measure the blood flow and oxygen levels in different parts of your heart. For further information please visit [email or web address]. Please follow instruction sheet, as given. ** please bring an overnight bag in case you have to spend the night   You will need to have blood work & a chest x-ray TODAY Please go to [address] do not need an appointment

## 2014-06-07 NOTE — Telephone Encounter (Signed)
Pt called and informed to take an 81 mg ASA tonight and in the Morning

## 2014-06-07 NOTE — Assessment & Plan Note (Signed)
09/2013 LDL=37, HDL =42; T chol = 92

## 2014-06-08 ENCOUNTER — Other Ambulatory Visit: Payer: Self-pay

## 2014-06-08 ENCOUNTER — Ambulatory Visit (HOSPITAL_COMMUNITY)
Admission: RE | Admit: 2014-06-08 | Discharge: 2014-06-09 | Disposition: A | Discharge status: Home / Self Care | Payer: Medicare Other | Source: Ambulatory Visit | Attending: Cardiovascular Disease | Admitting: Cardiovascular Disease

## 2014-06-08 ENCOUNTER — Other Ambulatory Visit: Payer: Self-pay | Admitting: *Deleted

## 2014-06-08 ENCOUNTER — Encounter (HOSPITAL_COMMUNITY)
Admission: RE | Disposition: A | Discharge status: Home / Self Care | Payer: Medicare Other | Source: Ambulatory Visit | Attending: Cardiovascular Disease

## 2014-06-08 DIAGNOSIS — I252 Old myocardial infarction: Secondary | ICD-10-CM | POA: Diagnosis not present

## 2014-06-08 DIAGNOSIS — Y849 Medical procedure, unspecified as the cause of abnormal reaction of the patient, or of later complication, without mention of misadventure at the time of the procedure: Secondary | ICD-10-CM | POA: Insufficient documentation

## 2014-06-08 DIAGNOSIS — E875 Hyperkalemia: Secondary | ICD-10-CM | POA: Insufficient documentation

## 2014-06-08 DIAGNOSIS — E039 Hypothyroidism, unspecified: Secondary | ICD-10-CM | POA: Diagnosis not present

## 2014-06-08 DIAGNOSIS — N183 Chronic kidney disease, stage 3 unspecified: Secondary | ICD-10-CM | POA: Insufficient documentation

## 2014-06-08 DIAGNOSIS — Z955 Presence of coronary angioplasty implant and graft: Secondary | ICD-10-CM

## 2014-06-08 DIAGNOSIS — G8929 Other chronic pain: Secondary | ICD-10-CM | POA: Diagnosis not present

## 2014-06-08 DIAGNOSIS — I129 Hypertensive chronic kidney disease with stage 1 through stage 4 chronic kidney disease, or unspecified chronic kidney disease: Secondary | ICD-10-CM | POA: Insufficient documentation

## 2014-06-08 DIAGNOSIS — M1990 Unspecified osteoarthritis, unspecified site: Secondary | ICD-10-CM | POA: Insufficient documentation

## 2014-06-08 DIAGNOSIS — Z7902 Long term (current) use of antithrombotics/antiplatelets: Secondary | ICD-10-CM | POA: Insufficient documentation

## 2014-06-08 DIAGNOSIS — Z9861 Coronary angioplasty status: Secondary | ICD-10-CM | POA: Diagnosis not present

## 2014-06-08 DIAGNOSIS — I498 Other specified cardiac arrhythmias: Secondary | ICD-10-CM | POA: Diagnosis not present

## 2014-06-08 DIAGNOSIS — K589 Irritable bowel syndrome without diarrhea: Secondary | ICD-10-CM | POA: Insufficient documentation

## 2014-06-08 DIAGNOSIS — R0602 Shortness of breath: Secondary | ICD-10-CM | POA: Insufficient documentation

## 2014-06-08 DIAGNOSIS — R42 Dizziness and giddiness: Secondary | ICD-10-CM | POA: Diagnosis not present

## 2014-06-08 DIAGNOSIS — Z8673 Personal history of transient ischemic attack (TIA), and cerebral infarction without residual deficits: Secondary | ICD-10-CM | POA: Insufficient documentation

## 2014-06-08 DIAGNOSIS — K219 Gastro-esophageal reflux disease without esophagitis: Secondary | ICD-10-CM | POA: Diagnosis not present

## 2014-06-08 DIAGNOSIS — M545 Low back pain, unspecified: Secondary | ICD-10-CM | POA: Insufficient documentation

## 2014-06-08 DIAGNOSIS — N289 Disorder of kidney and ureter, unspecified: Secondary | ICD-10-CM | POA: Diagnosis not present

## 2014-06-08 DIAGNOSIS — J4489 Other specified chronic obstructive pulmonary disease: Secondary | ICD-10-CM | POA: Insufficient documentation

## 2014-06-08 DIAGNOSIS — E785 Hyperlipidemia, unspecified: Secondary | ICD-10-CM | POA: Insufficient documentation

## 2014-06-08 DIAGNOSIS — I359 Nonrheumatic aortic valve disorder, unspecified: Secondary | ICD-10-CM | POA: Diagnosis not present

## 2014-06-08 DIAGNOSIS — M542 Cervicalgia: Secondary | ICD-10-CM | POA: Diagnosis not present

## 2014-06-08 DIAGNOSIS — I200 Unstable angina: Secondary | ICD-10-CM | POA: Insufficient documentation

## 2014-06-08 DIAGNOSIS — J449 Chronic obstructive pulmonary disease, unspecified: Secondary | ICD-10-CM | POA: Insufficient documentation

## 2014-06-08 DIAGNOSIS — I951 Orthostatic hypotension: Secondary | ICD-10-CM | POA: Diagnosis not present

## 2014-06-08 DIAGNOSIS — I10 Essential (primary) hypertension: Secondary | ICD-10-CM | POA: Diagnosis present

## 2014-06-08 DIAGNOSIS — I2510 Atherosclerotic heart disease of native coronary artery without angina pectoris: Secondary | ICD-10-CM

## 2014-06-08 DIAGNOSIS — Z01818 Encounter for other preprocedural examination: Secondary | ICD-10-CM

## 2014-06-08 HISTORY — PX: PERCUTANEOUS CORONARY INTERVENTION-BALLOON ONLY: SHX6014

## 2014-06-08 HISTORY — PX: LEFT HEART CATHETERIZATION WITH CORONARY ANGIOGRAM: SHX5451

## 2014-06-08 LAB — BASIC METABOLIC PANEL
BUN: 49 mg/dL — AB (ref 6–23)
CALCIUM: 9 mg/dL (ref 8.4–10.5)
CO2: 15 mEq/L — ABNORMAL LOW (ref 19–32)
CREATININE: 1.96 mg/dL — AB (ref 0.50–1.35)
Chloride: 112 mEq/L (ref 96–112)
GFR, EST AFRICAN AMERICAN: 35 mL/min — AB (ref >90)
GFR, EST NON AFRICAN AMERICAN: 30 mL/min — AB (ref >90)
Glucose, Bld: 86 mg/dL (ref 70–99)
Potassium: 5.5 mEq/L — ABNORMAL HIGH (ref 3.7–5.3)
Sodium: 143 mEq/L (ref 137–147)

## 2014-06-08 LAB — POCT ACTIVATED CLOTTING TIME
ACTIVATED CLOTTING TIME: 135 s
Activated Clotting Time: 321 seconds

## 2014-06-08 SURGERY — LEFT HEART CATHETERIZATION WITH CORONARY ANGIOGRAM
Anesthesia: LOCAL

## 2014-06-08 MED ORDER — FENTANYL CITRATE 0.05 MG/ML IJ SOLN
INTRAMUSCULAR | Status: AC
  Filled 2014-06-08: qty 2

## 2014-06-08 MED ORDER — TEMAZEPAM 15 MG PO CAPS: 15.0000 mg | ORAL_CAPSULE | Freq: Every evening | ORAL | Status: DC | PRN

## 2014-06-08 MED ORDER — ATORVASTATIN CALCIUM 40 MG PO TABS
40.0000 mg | ORAL_TABLET | Freq: Every day | ORAL | Status: DC
  Filled 2014-06-08: qty 1

## 2014-06-08 MED ORDER — ATORVASTATIN CALCIUM 40 MG PO TABS: 40.0000 mg | ORAL_TABLET | Freq: Every day | ORAL | Status: DC

## 2014-06-08 MED ORDER — SODIUM CHLORIDE 0.9 % IV SOLN
INTRAVENOUS | Status: DC
  Administered 2014-06-08: 09:00:00 via INTRAVENOUS

## 2014-06-08 MED ORDER — CLOPIDOGREL BISULFATE 75 MG PO TABS
ORAL_TABLET | ORAL | Status: AC
  Filled 2014-06-08: qty 2

## 2014-06-08 MED ORDER — LIDOCAINE HCL (PF) 1 % IJ SOLN
INTRAMUSCULAR | Status: AC
  Filled 2014-06-08: qty 30

## 2014-06-08 MED ORDER — ASPIRIN 81 MG PO TABS: 81.0000 mg | ORAL_TABLET | Freq: Every day | ORAL | Status: DC

## 2014-06-08 MED ORDER — CLOPIDOGREL BISULFATE 75 MG PO TABS: 75.0000 mg | ORAL_TABLET | Freq: Every day | ORAL | Status: DC

## 2014-06-08 MED ORDER — NITROGLYCERIN IN D5W 200-5 MCG/ML-% IV SOLN
INTRAVENOUS | Status: AC
  Filled 2014-06-08: qty 250

## 2014-06-08 MED ORDER — NITROGLYCERIN IN D5W 200-5 MCG/ML-% IV SOLN: 2.0000 ug/min | INTRAVENOUS | Status: DC

## 2014-06-08 MED ORDER — BRINZOLAMIDE 1 % OP SUSP
1.0000 [drp] | Freq: Two times a day (BID) | OPHTHALMIC | Status: DC
  Administered 2014-06-08 – 2014-06-09 (×2): 1 [drp] via OPHTHALMIC
  Filled 2014-06-08: qty 10

## 2014-06-08 MED ORDER — ASPIRIN 81 MG PO CHEW: 81.0000 mg | CHEWABLE_TABLET | ORAL | Status: DC

## 2014-06-08 MED ORDER — NITROGLYCERIN 0.2 MG/ML ON CALL CATH LAB
INTRAVENOUS | Status: AC
  Filled 2014-06-08: qty 1

## 2014-06-08 MED ORDER — SODIUM CHLORIDE 0.9 % IJ SOLN: 3.0000 mL | INTRAMUSCULAR | Status: DC | PRN

## 2014-06-08 MED ORDER — ASPIRIN EC 81 MG PO TBEC
81.0000 mg | DELAYED_RELEASE_TABLET | Freq: Every day | ORAL | Status: DC
  Filled 2014-06-08: qty 1

## 2014-06-08 MED ORDER — METOPROLOL SUCCINATE ER 50 MG PO TB24
50.0000 mg | ORAL_TABLET | Freq: Every day | ORAL | Status: DC
  Filled 2014-06-08: qty 1

## 2014-06-08 MED ORDER — SODIUM CHLORIDE 0.9 % IV SOLN
INTRAVENOUS | Status: DC
Start: 2014-06-08 — End: 2014-06-09
  Administered 2014-06-08: 19:00:00 via INTRAVENOUS

## 2014-06-08 MED ORDER — HEPARIN (PORCINE) IN NACL 2-0.9 UNIT/ML-% IJ SOLN
INTRAMUSCULAR | Status: AC
  Filled 2014-06-08: qty 1000

## 2014-06-08 MED ORDER — NITROGLYCERIN 0.4 MG SL SUBL: 0.4000 mg | SUBLINGUAL_TABLET | SUBLINGUAL | Status: DC | PRN

## 2014-06-08 MED ORDER — CLOPIDOGREL BISULFATE 75 MG PO TABS
75.0000 mg | ORAL_TABLET | Freq: Every day | ORAL | Status: DC
  Administered 2014-06-09: 09:00:00 75 mg via ORAL
  Filled 2014-06-08: qty 1

## 2014-06-08 MED ORDER — BIVALIRUDIN 250 MG IV SOLR
INTRAVENOUS | Status: AC
  Filled 2014-06-08: qty 250

## 2014-06-08 MED ORDER — MIDAZOLAM HCL 2 MG/2ML IJ SOLN
INTRAMUSCULAR | Status: AC
  Filled 2014-06-08: qty 2

## 2014-06-08 NOTE — CV Procedure (Signed)
Nicholas Porter is a 80 y.o. male    [id number]  [id number] LOCATION:  FACILITY: [facility]  PHYSICIAN: Troy Sine, MD, [facility] 09/08/1930   DATE OF PROCEDURE:  06/08/2014    CARDIAC CATHETERIZATION     HISTORY:    Nicholas Porter is a 80 y.o. male who has established coronary artery disease   PROCEDURE: Left heart catheterization: Coronary angiography, LV pressure recording without ventriculography;  Cutting Balloon/PTCA for in-stent restenosis in the mid right coronary artery.  The patient was brought to the second floor [facility]iac cath lab in the postabsorptive state.  He was premedicated with Versed 1 mg and fentanyl 25 mcg intravenously.  Due to his baseline renal insufficiency, the study was planned to be done with limited contrast.  His a slight fluids have been increased to 125 cc per hour prior to the procedure.  His right femoral artery was punctured anteriorly and a 5 French sheath was inserted without difficulty.  Diagnostic catheterization was done utilizing a 5 [place] Judkins 4 left and right coronary catheters.  A pigtail catheter was inserted to cross into the left ventricle.  LV pressures were recorded, but left ventriculography was not done to reduce contrast load.  With the demonstration of very focal, eccentric 75% in-stent restenosis of the mid RCA  the decision was made to at perform intervention as it was felt that this could be done with  minimal additional contrast.  A total of 35 cc of contrast was used for the diagnostic study. The 5 French sheath was upgraded to a 6 [place] sheath. Angiomax bolus plus infusion was administered.  The patient was already taking Plavix and he received an additional 150 mg oral dose.  A 6 French right guide with side holes was used for the interventional procedure after there was dampening noted with the guide without side holes.  A prowater wire was advanced down the RCA after Angiomax bolus plus infusion was administered.  ACT was  documented to be therapeutic.  The patient had a previously placed Promus 3.0 mm stent in the RCA.  A 3.25x15 mm Flextome cutting balloon was used and several dilatations at 5,7,8, and9 atmospheres were made.  With each balloon inflation, the patient experienced the identical chest pain that he has been experiencing recently at home.  A 3.5x12 mm [place] Trek was then inserted with 4 inflations up to 11 atmospheres corresponding to ~ 3.48 mm.  [facility] angiography confirmed an excellent angiographic result. The patient left the catheterization laboratory, chest pain free with stable hemodynamics.   HEMODYNAMICS:   Central Aorta: 175/63   Left Ventricle: 175/20  ANGIOGRAPHY:  Left main: Normal vessel, which bifurcated into the LAD and left circumflex vessel   LAD: Mild 30% proximal narrowing before the first diagonal vessel.  The remainder of the LAD was free of significant disease and extended to the apex  Left circumflex: 20% proximal narrowing.  The circumflex gave rise to one major marginal vessel.   Right coronary artery: Dominant vessel, which had the previously placed stent in the proximal to mid segment.  There is a very focal, eccentric 75% stenosis in the mid-distal portion of the stent.  There was 20% smooth narrowing at the acute margin.  The vessel and the PDA and PLA vessel.  Left ventriculography was not done although LV pressures were recorded.  Due to the patient's renal insufficiency  Following flecks from Cutting Balloon and noncompliant balloon dilatation for the stent restenosis in the right coronary artery,  the 75% eccentric hard stenosis was reduced to approximately 5%.  There was brisk TIMI-3 flow.  There is no evidence for dissection.   Total contrast used for the diagnostic and interventional procedure: 65 cc  IMPRESSION:  Mild CAD involving the left coronary system with 30% proximal LAD stenosis and 20% circumflex narrowing.    75% eccentric in-stent restenosis in the  previously placed RCA stent.  Successful PCI for in-stent restenosis utilizing a FlextomeCutting Balloon/[place] balloon dilatation with the stenosis reduced to less than 5%.  RECOMMENDATION:  The patient will be aggressively hydrated following the procedure.  His lisinopril will be discontinued which may be contributing to his renal insufficiency and mild hyperkalemia.  He will continue on dual antiplatelet therapy for a minimum of a year.    Troy Sine, MD, [facility] 06/08/2014 4:28 PM

## 2014-06-08 NOTE — Interval H&P Note (Signed)
Cath Lab Visit (complete for each Cath Lab visit)  Clinical Evaluation Leading to the Procedure:   ACS: no  Non-ACS:    Anginal Classification: CCS III  Anti-ischemic medical therapy: Minimal Therapy (1 class of medications)  Non-Invasive Test Results: No non-invasive testing performed  Prior CABG: No previous CABG      History and Physical Interval Note:  06/08/2014 2:54 PM  Nicholas Porter  has presented today for surgery, with the diagnosis of cp  The various methods of treatment have been discussed with the patient and family. After consideration of risks, benefits and other options for treatment, the patient has consented to  Procedure(s): LEFT HEART CATHETERIZATION WITH CORONARY ANGIOGRAM (N/A) as a surgical intervention .  The patient's history has been reviewed, patient examined, no change in status, stable for surgery.  I have reviewed the patient's chart and labs.  Questions were answered to the patient's satisfaction.     KELLY,THOMAS A

## 2014-06-08 NOTE — H&P (View-Only) (Signed)
06/07/2014   PCP: Cathlean Cower, MD   Chief Complaint  Patient presents with  . 6 month visit    reports chest pain (when goes to bed) - takes NTGx1 with relief x2 doses in a week; reports shortness of breath while walking/getting garbage; reports lightheadedness/dizziness; reports change in voice (is hoarse);     Primary Cardiologist: Dr. Roni Bread   HPI:  Nicholas Porter is a 80 y.o. male with a an extensive PMH below who presents today for chest pain.   He was admitted on October 14 for inferior STEMI with RCA occlusion treated with a Promus Premier DES stent. He had a relatively smooth post MI course. The plan was to treat his existing LAD and circumflex lesions medically, however he was not able tolerate beta blockers due to bradycardia while inpatient..   Prior to the Oct. STEMI, we had been following him for moderate coronary disease as well as mild to moderate aortic regurgitation. He has had an echocardiogram performed after his STEMI that showed no regional wall motion abnormalities, and relatively stable mild to moderate aortic regurgitation with very mild aortic stenosis.  By December he was short of breath and his Proventil was stopped and patient was placed on Plavix.  He had a LexiScan Myoview which revealed low risk nuclear study with subtle inferior apical ischemia versus diaphragmatic attenuation and normal LV function.    That time the patient's dyspnea with exertion has increased now after walking to the mailbox and back to his house he has to lie down to catch his breath. Last week once he was going to bed developed midsternal chest pressure with shortness of breath he got up walked around and increase the discomfort one nitroglycerin with relief. The next night he did well but on the second night he again had similar symptoms and again it was relieved with one nitroglycerin sublingual.  Since that time he has been very fatigued.  EKG was without acute  changes.  His other complaints are some dizziness with minimal drop in blood pressure going from lying to standing and a change in his voice. In the past he went 4 months without any voice and workup by ENT did not find a source.     Cath 10/05/13 Left mainstem: Calcified, normal.  Left anterior descending (LAD): 70% mid LAD immediately after the first diagonal.  Left circumflex (LCx): 30% proximal. 80% proximal OM2  Right coronary artery (RCA): 30% ostial. 100% mid RCA with TIMI 0 flow.  Left ventriculography: Not done  Stent to 100% mid RCA with Promus DES  Allergies  Allergen Reactions  . Amlodipine Besylate Hives  . Naproxen Diarrhea  . Oxycodone-Acetaminophen Other (See Comments)    REACTION: unspecified    Current Outpatient Prescriptions  Medication Sig Dispense Refill  . atorvastatin (LIPITOR) 80 MG tablet Take 0.5 tablets (40 mg total) by mouth daily at 6 PM.  90 tablet  3  . brinzolamide (AZOPT) 1 % ophthalmic suspension Place 1 drop into the left eye every 12 (twelve) hours.      . clopidogrel (PLAVIX) 75 MG tablet Take 1 tablet (75 mg total) by mouth daily.  30 tablet  6  . lisinopril (PRINIVIL,ZESTRIL) 5 MG tablet Take 2.5 mg by mouth daily.      . metoprolol succinate (TOPROL-XL) 50 MG 24 hr tablet Take 1 tablet (50 mg total) by mouth daily. Take with or immediately following a meal.  90 tablet  3  . nitroGLYCERIN (NITROSTAT) 0.4 MG SL tablet Place 1 tablet (0.4 mg total) under the tongue every 5 (five) minutes as needed. For chest pain.  25 tablet  6  . temazepam (RESTORIL) 15 MG capsule Take 1 capsule (15 mg total) by mouth at bedtime as needed for sleep.  30 capsule  1  . TRAVATAN Z 0.004 % SOLN ophthalmic solution Place 1 drop into both eyes at bedtime.        No current facility-administered medications for this visit.    Past Medical History  Diagnosis Date  . CAD in native artery 2001    Relook cath in November 2011: 6-70% ostial circumflex OM lesion, 40-50%  lesions in the LAD and RCA. Similar to 2001 catheterization.; Persantine Myoview October 2012: Lower scan no ischemia or infarction. EF 66%.  . ST elevation myocardial infarction (STEMI) of inferior wall, emergent PCI with Promus DES to RCA 10/05/2013    100% mRCA occlusion; residual 80% OM2, ~70% LAD --> PCI to RCA  . CAD S/P percutaneous coronary angioplasty 10/05/2013    PCI to RCA - Promus Premier DES 3.0 mm x 28 mm to mRCA (~3.19 mm)  . Moderate aortic insufficiency February 2012    Echocardiogram: EF > 55%; impaired relaxation. Mild/moderate MAC with mild MR; Moderate Aortic Regurgitation and mild sclerosis/ NO stenosis; mild - mod Pulm HTN (40-62mmHg);; Follow-up 05/2013: EF 65-70%, PAP ~31 mmH, otherwise no change  . Mild aortic stenosis  10/05/2013    Echo post STEMI: EF 55-60%. No regional W. MA. Mild aortic stenosis with mild regurgitation. MAC. Moderate pulmonary hypertension, 58 mmHg.  . Carotid artery stenosis  December 2012  . Hypertension, benign   . Hyperlipidemia LDL goal <70   . CKD (chronic kidney disease), stage III   . COPD (chronic obstructive pulmonary disease)  12/07/2011  . Thrombocytopenia, acquired 06/30/2007  . Anemia, B12 deficiency 11/04/2008  . Personal history of peptic ulcer disease 06/30/2007  . GERD (gastroesophageal reflux disease) 06/30/2007  . Irritable bowel syndrome (IBS)  12/08/2008  . Gallstones  12/07/2011  . Hernia, hiatal  08/25/2008  . 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 History of colonic polyps 08/25/2008  . Diverticulosis of colon 12/08/2008  . Osteoarthritis 05/22/2008; 06/30/2007     with chronic upper and lumbar back pain as well as neck pain.  . Chronic back pain 12/06/2011  . Neck pain, chronic 12/06/2011  . Osteopenia 10/18/2008  . Hypothyroidism 10/18/2008  . Finger amputation, traumatic   . Personal history of TIA (transient ischemic attack) 2007    Past Surgical History  Procedure Laterality Date  . Foot surgery      left    . Carpal tunnel release    . Hernia repair    . Total knee arthroplasty    . Carotid endarterectomy  2007  . Amputation      left index finger -tramatic  . Vagotomy      partial gastrectomy  . Partial gastrectomy    . Corneal transplant    . Rotator cuff repair      left  . Spine surgery  1985    cervical laminectomy  . Cardiac catheterization  November 2011    40% mid LAD, 50% proximal RCA, 30-40% distal RCA (DOMINANT RCA with wraparound PDA & 2 large PLBs), 60-70% ostial OM1. No change from 2001. Medical therapy  . Shoulder arthroscopy with rotator cuff repair and subacromial decompression Right 07/01/2013    Procedure: RIGHT SHOULDER ARTHROSCOPY WITH  SUBACROMIAL DECOMPRESSION/DISTAL CLAVICLE RESECTION/ROTATOR CUFF REPAIR ;  Surgeon: Marin Shutter, MD;  Location: [facility];  Service: Orthopedics;  Laterality: Right;  . Coronary angioplasty with stent placement  09/2013    Promus DES to RCA with STEMI; residual 70% mid-distal LAD, OM 270-80%. Medical management    [id number] colds or fevers, no weight changes, + weakness Skin:no rashes or ulcers HEENT:no blurred vision, no congestion, voice is not his usual tone CV:see HPI PUL:see HPI GI:no diarrhea constipation or melena, no indigestion GU:no hematuria, no dysuria MS:no joint pain, no claudication Neuro:no syncope, + lightheadedness at times Endo:no diabetes, no thyroid disease  Wt Readings from Last 3 Encounters:  06/07/14 139 lb (63.05 kg)  01/31/14 139 lb 9.6 oz (63.322 kg)  12/01/13 140 lb (63.504 kg)    PHYSICAL EXAM BP 104/50  Pulse 61  Ht 5\' 7"  (1.702 m)  Wt 139 lb (63.05 kg)  BMI 21.77 kg/m2 General:Pleasant affect, NAD Skin:Warm and dry, brisk capillary refill HEENT:normocephalic, sclera clear, mucus membranes moist Neck:supple, no JVD, no bruits, no adenopathy  Heart:S1S2 RRR with soft systolic murmur, gallup, rub or click Lungs:clear without rales, rhonchi, or wheezes [id number], non tender, + BS, do  not palpate liver spleen or masses Ext:no lower ext edema, 2+ pedal pulses, 2+ radial pulses Neuro:alert and oriented, MAE, follows commands, + facial symmetry  Orthostatic BP lying:  148/67, sitting 139/64;  Standing 127/65, standing for 3 min 140/63  EKG: SR mild LVH peaked T waves V2-3 but no changes from 10/25/13  ASSESSMENT AND PLAN Unstable angina, relief with NTG 2 episodes of unstable angina last week relief with nitroglycerin has been very weak and tired since his episodes. Discuss with his cardiologist Dr. Ellyn Hack plan will be for cardiac catheterization tomorrow patient and his wife are agreeable to this plan. Discussed 1% chance of stroke heart attack or death for procedure.  Patient has not been taking his aspirin we will ask him to resume 81 mg daily  CAD (coronary artery disease) Cardiac cath 2014: Left mainstem: Calcified, normal.  Left anterior descending (LAD): 70% mid LAD immediately after the first diagonal.  Left circumflex (LCx): 30% proximal. 80% proximal OM2  Right coronary artery (RCA): 30% ostial. 100% mid RCA with TIMI 0 flow.  Left ventriculography: Not done  Stent to 100% mid RCA with Promus DES Last nuc study 11/2013 with subtle inf. Apical ischemia vs. Diaphragmatic attenuation   Bradycardia, sinus, on admit-resolved  BB stopped  CKD (chronic kidney disease), stage III Check labs stat today.  HYPERLIPIDEMIA 09/2013 LDL=37, HDL =42; T chol = 92  Orthostatic hypotension Pt with mild dizziness and does have mild drop in BP, no med changes were done will need to treat angina first.

## 2014-06-09 ENCOUNTER — Other Ambulatory Visit: Payer: Self-pay | Admitting: Physician Assistant

## 2014-06-09 ENCOUNTER — Encounter (HOSPITAL_COMMUNITY): Payer: Self-pay | Admitting: *Deleted

## 2014-06-09 DIAGNOSIS — E785 Hyperlipidemia, unspecified: Secondary | ICD-10-CM | POA: Diagnosis not present

## 2014-06-09 DIAGNOSIS — I2510 Atherosclerotic heart disease of native coronary artery without angina pectoris: Secondary | ICD-10-CM | POA: Diagnosis not present

## 2014-06-09 DIAGNOSIS — Z01818 Encounter for other preprocedural examination: Secondary | ICD-10-CM

## 2014-06-09 DIAGNOSIS — I951 Orthostatic hypotension: Secondary | ICD-10-CM | POA: Diagnosis not present

## 2014-06-09 DIAGNOSIS — N189 Chronic kidney disease, unspecified: Secondary | ICD-10-CM

## 2014-06-09 DIAGNOSIS — I200 Unstable angina: Secondary | ICD-10-CM | POA: Diagnosis not present

## 2014-06-09 LAB — CBC
HCT: 30.2 % — ABNORMAL LOW (ref 39.0–52.0)
Hemoglobin: 10.2 g/dL — ABNORMAL LOW (ref 13.0–17.0)
MCH: 31.4 pg (ref 26.0–34.0)
MCHC: 33.8 g/dL (ref 30.0–36.0)
MCV: 92.9 fL (ref 78.0–100.0)
Platelets: 76 10*3/uL — ABNORMAL LOW (ref 150–400)
RBC: 3.25 MIL/uL — ABNORMAL LOW (ref 4.22–5.81)
RDW: 14.8 % (ref 11.5–15.5)
WBC: 5.3 10*3/uL (ref 4.0–10.5)

## 2014-06-09 LAB — BASIC METABOLIC PANEL
BUN: 43 mg/dL — ABNORMAL HIGH (ref 6–23)
CO2: 14 meq/L — AB (ref 19–32)
Calcium: 8.4 mg/dL (ref 8.4–10.5)
Chloride: 118 mEq/L — ABNORMAL HIGH (ref 96–112)
Creatinine, Ser: 1.75 mg/dL — ABNORMAL HIGH (ref 0.50–1.35)
GFR calc Af Amer: 40 mL/min — ABNORMAL LOW (ref >90)
GFR, EST NON AFRICAN AMERICAN: 35 mL/min — AB (ref >90)
Glucose, Bld: 161 mg/dL — ABNORMAL HIGH (ref 70–99)
Potassium: 4.3 mEq/L (ref 3.7–5.3)
SODIUM: 145 meq/L (ref 137–147)

## 2014-06-09 MED ORDER — HYDRALAZINE HCL 25 MG PO TABS
25.0000 mg | ORAL_TABLET | Freq: Two times a day (BID) | ORAL | Status: DC
  Administered 2014-06-09: 25 mg via ORAL
  Filled 2014-06-09 (×2): qty 1

## 2014-06-09 MED ORDER — HYDRALAZINE HCL 25 MG PO TABS: 25.0000 mg | ORAL_TABLET | Freq: Two times a day (BID) | ORAL | Status: DC

## 2014-06-09 MED FILL — Sodium Chloride IV Soln 0.9%: INTRAVENOUS | Qty: 50 | Status: AC

## 2014-06-09 NOTE — Progress Notes (Signed)
Site area: right groin  Site Prior to Removal:  Level 0  Pressure Applied For 40  MINUTES    Minutes Beginning at 22:00  Manual:   yes  Patient Status During Pull:  Alert X oriented X 3  Post Pull Groin Site:  Level 0  Post Pull Instructions Given:  yes  Post Pull Pulses Present:  yes  Dressing Applied:  yes  Comments:  Pt tolerated removal of sheath without complications. Will continue to monitor patient

## 2014-06-09 NOTE — Discharge Instructions (Signed)
Angina Pectoris Angina pectoris, often just called angina, is extreme discomfort in your chest, neck, or arm caused by a lack of blood in the middle and thickest layer of your heart wall (myocardium). It may feel like tightness or heavy pressure. It may feel like a crushing or squeezing pain. Some people say it feels like gas or indigestion. It may go down your shoulders, back, and arms. Some people may have symptoms other than pain. These symptoms include fatigue, shortness of breath, cold sweats, or nausea. There are four different types of angina:  Stable angina--Stable angina usually occurs in episodes of predictable frequency and duration. It usually is brought on by physical activity, emotional stress, or excitement. These are all times when the myocardium needs more oxygen. Stable angina usually lasts a few minutes and often is relieved by taking a medicine that can be taken under your tongue (sublingually). The medicine is called nitroglycerin. Stable angina is caused by a buildup of plaque inside the arteries, which restricts blood flow to the heart muscle (atherosclerosis).  Unstable angina--Unstable angina can occur even when your body experiences little or no physical exertion. It can occur during sleep. It can also occur at rest. It can suddenly increase in severity or frequency. It might not be relieved by sublingual nitroglycerin. It can last up to 30 minutes. The most common cause of unstable angina is a blood clot that has developed on the top of plaque buildup inside a coronary artery. It can lead to a heart attack if the blood clot completely blocks the artery.  Microvascular angina--This type of angina is caused by a disorder of tiny blood vessels called arterioles. Microvascular angina is more common in women. The pain may be more severe and last longer than other types of angina pectoris.  Prinzmetal or variant angina--This type of angina pectoris usually occurs when your body  experiences little or no physical exertion. It especially occurs in the early morning hours. It is caused by a spasm of your coronary artery. HOME CARE INSTRUCTIONS   Only take over-the-counter and prescription medicines as directed by your health care provider.  Stay active or increase your exercise as directed by your health care provider.  Limit strenuous activity as directed by your health care provider.  Limit heavy lifting as directed by your health care provider.  Maintain a healthy weight.  Learn about and eat heart-healthy foods.  Do not use any tobacco products including cigarettes, chewing tobacco or electronic cigarettes. SEEK IMMEDIATE MEDICAL CARE IF:  You experience the following symptoms:  Chest, neck, deep shoulder, or arm pain or discomfort that lasts more than a few minutes.  Chest, neck, deep shoulder, or arm pain or discomfort that goes away and comes back, repeatedly.  Heavy sweating with discomfort, without a noticeable cause.  Shortness of breath or difficulty breathing.  Angina that does not get better after a few minutes of rest or after taking sublingual nitroglycerin. These can all be symptoms of a heart attack, which is a medical emergency! Get medical help at once. Call your local emergency service (911 in [place] immediately. Do not  drive yourself to the hospital and do not  wait to for your symptoms to go away. MAKE SURE YOU:  Understand these instructions.  Will watch your condition.  Will get help right away if you are not doing well or get worse. Document Released: 12/09/2005 Document Revised: 12/14/2013 Document Reviewed: 09/17/2012 [facility] Patient Information 2015 [place], [place]. This information is not intended  to replace advice given to you by your health care provider. Make sure you discuss any questions you have with your health care provider.  Coronary Angiogram With Stent, Care After Refer to this sheet in the next few weeks. These  instructions provide you with information on caring for yourself after your procedure. Your health care provider may also give you more specific instructions. Your treatment has been planned according to current medical practices, but problems sometimes occur. Call your health care provider if you have any problems or questions after your procedure.  WHAT TO EXPECT AFTER THE PROCEDURE  The insertion site may be tender for a few days after your procedure. HOME CARE INSTRUCTIONS   Only take over-the-counter or prescription medicines as directed by your health care provider. Take all medicines exactly as instructed. Blood thinners may be prescribed after your procedure to improve blood flow through the stent.  Change any bandages (dressings) as directed by your health care provider.   Check your insertion site every day for redness, swelling, or fluid leaking from the insertion.   Do not bathe, swim, or use a hot tub until directed by your health care provider. You may shower. Pat the insertion area dry. Do not rub the insertion area with a washcloth or towel.   Eat a heart-healthy diet. This should include plenty of fresh fruits and vegetables. Meat should be lean cuts. Avoid the following types of food:   Food that is high in salt.   Canned or highly processed food.   Food that is high in saturated fat or sugar.   Fried food.   Make any other lifestyle changes recommended by your health care provider. This may include:   Not using any tobacco products including cigarettes, chewing tobacco or electronic cigarettes.  Managing your weight.   Getting regular exercise.   Managing your blood pressure.   Limiting your alcohol intake.   Managing other health problems, such as diabetes.   If you need an MRI after your heart stent was placed, be sure to tell the health care provider who orders the MRI that you have a heart stent.   Follow up with your health care provider  as directed.  SEEK IMMEDIATE MEDICAL CARE IF:   You develop chest pain, shortness of breath, feel faint, or pass out.  You have bleeding, swelling larger than a walnut, or drainage from the catheter insertion site.  You develop pain, discoloration, coldness, or severe bruising in the leg or arm that held the catheter.  You develop bleeding from any other place such as from the bowels. There may be bright red blood in the urine or stools, or it may appear as black, tarry stools.  You have a fever or chills. MAKE SURE YOU:  Understand these instructions.  Will watch your condition.  Will get help right away if you are not doing well or get worse. Document Released: 06/28/2005 Document Revised: 12/14/2013 Document Reviewed: 05/12/2013 [facility] Patient Information 2015 [place], [place]. This information is not intended to replace advice given to you by your health care provider. Make sure you discuss any questions you have with your health care provider.  No driving for 24 hours. No lifting over 5 lbs for 1 week. No sexual activity for 1 week. Keep procedure site clean & dry. If you notice increased pain, swelling, bleeding or pus, call/return!  You may shower, but no soaking baths/hot tubs/pools for 1 week.

## 2014-06-09 NOTE — Progress Notes (Signed)
Pt has walked independently without problems. Anxious to go home. Ed reviewed with pt and wife (we saw pt 10/14). Pt active but no formal ex. Encouraged daily walks. Pt declined CRPII. Reviewed NTG.  [id number] Yves Dill CES, ACSM 8:45 AM 06/09/2014

## 2014-06-09 NOTE — Discharge Summary (Signed)
Discharge Summary   Patient ID: Nicholas Porter,  MRN: [id number], DOB/AGE: [birth year] 80 y.o.  Admit date: 06/08/2014 Discharge date: 06/09/2014  Primary Care Dep[facility] [place]pe: Cathlean Cower Primary Cardiologist: Dr. Ellyn Hack  Discharge Diagnoses Principal Problem:   Unstable angina, relief with NTG Active Problems:   HYPERLIPIDEMIA   HYPERTENSION   CAD (coronary artery disease)   Orthostatic hypotension   Bradycardia   Allergies Allergies  Allergen Reactions  . Amlodipine Besylate Hives  . Naproxen Diarrhea  . Oxycodone-Acetaminophen Other (See Comments)    REACTION: unspecified    Procedures  ANGIOGRAPHY:  Left main: Normal vessel, which bifurcated into the LAD and left circumflex vessel  LAD: Mild 30% proximal narrowing before the first diagonal vessel. The remainder of the LAD was free of significant disease and extended to the apex  Left circumflex: 20% proximal narrowing. The circumflex gave rise to one major marginal vessel.  Right coronary artery: Dominant vessel, which had the previously placed stent in the proximal to mid segment. There is a very focal, eccentric 75% stenosis in the mid-distal portion of the stent. There was 20% smooth narrowing at the acute margin. The vessel and the PDA and PLA vessel.  Left ventriculography was not done although LV pressures were recorded. Due to the patient's renal insufficiency  Following flecks from Cutting Balloon and noncompliant balloon dilatation for the stent restenosis in the right coronary artery, the 75% eccentric hard stenosis was reduced to approximately 5%. There was brisk TIMI-3 flow. There is no evidence for dissection.  Total contrast used for the diagnostic and interventional procedure: 65 cc  IMPRESSION:  Mild CAD involving the left coronary system with 30% proximal LAD stenosis and 20% circumflex narrowing.  75% eccentric in-stent restenosis in the previously placed RCA stent.  Successful PCI for in-stent restenosis  utilizing a FlextomeCutting Balloon/Oakes balloon dilatation with the stenosis reduced to less than 5%.  RECOMMENDATION:  The patient will be aggressively hydrated following the procedure. His lisinopril will be discontinued which may be contributing to his renal insufficiency and mild hyperkalemia. He will continue on dual antiplatelet therapy for a minimum of a year.    Hospital Course  The patient is an 80 year old male with past medical history significant for coronary disease, hypertension, hyperlipidemia and a history of orthostatic hypotension. His last cardiac catheterization was in October 2014 for inferior STEMI with RCA occlusion treated with drug-eluting stent. He was noted to have some bradycardia at the time. By December, he had worsening shortness of breath. Lexiscan Myoview on 12/01/2013 revealed low risk study with apical ischemia versus traumatic attenuation and normal LV function. Since that time, patient's dyspnea with exertion has increased. Last, patient had an episode of midsternal chest pressure with shortness breath while going to bed. He took one nitroglycerin with relief of the symptoms. On the following night, he had similar symptom and again was relieved with nitroglycerin. His symptom was concerning for unstable angina, and left heart catheterization was scheduled.   Patient underwent left heart catheterization on 06/08/2014 which revealed mild disease the proximal LAD and left circumflex, there is a 75% eccentric in-stent restenosis in the previously placed RCA stent which was treated with balloon angioplasty with successful reduction of stenosis area down to 5%. His lisinopril was discontinued as his creatinine was elevated at 1.75.  Patient was seen the morning of 06/09/2014, at which time he was doing well without significant complaints of any chest pain or shortness breath. Right groin cath site appears to be clean, dry, intact  without significant bleeding or hematoma  however does reveal some bruising. Patient has been seen by Dr. Ellyn Hack and is deemed stable for discharge from cardiology perspective. Patient's blood pressure has been consistently elevated in the 130s to 150s after discontinuation of his ACE inhibitor, and 25 mg twice a day dose of hydralazine will be added as patient is intolerant to amlodipine. He does have some bradycardia overnight with heart rates in the 40s, however she has been taking metoprolol without significant symptoms. After discussing with Dr. Ellyn Hack, it is decided the patient should resume his previous dose of metoprolol.  Patient will be continued on medical therapy for at least one year. Patient has a previously scheduled a followup with Dr. Ellyn Hack on 06/14/2014. He will also need to obtain BMET in the [facility] clinic on 06/13/2014 to monitor his renal function.  Discharge Vitals Blood pressure 165/53, pulse 55, temperature 97.4 F (36.3 C), temperature source Oral, resp. rate 20, height 5\' 7"  (1.702 m), weight 139 lb 12.4 oz (63.4 kg), SpO2 100.00%.  Filed Weights   06/08/14 0743 06/09/14 0613  Weight: 139 lb (63.05 kg) 139 lb 12.4 oz (63.4 kg)    Labs  CBC  Recent Labs  06/07/14 1259 06/09/14 0335  WBC 8.1 5.3  HGB 13.1 10.2*  HCT 37.7* 30.2*  MCV 90.2 92.9  PLT 113* 76*   Basic Metabolic Panel  Recent Labs  06/08/14 1237 06/09/14 0335  NA 143 145  K 5.5* 4.3  CL 112 118*  CO2 15* 14*  GLUCOSE 86 161*  BUN 49* 43*  CREATININE 1.96* 1.75*  CALCIUM 9.0 8.4   Thyroid Function Tests  Recent Labs  06/07/14 1259  TSH 3.709    Disposition  Pt is being discharged home today in good condition.  Follow-up Plans & Appointments      Follow-up Information   Follow up with [facility] On 06/13/2014. (2:15pm)    Contact information:   [place] [id number]       Follow up with Leonie Man, MD On 06/14/2014. (11:15am)    Specialty:  Cardiology   Contact  information:   [address] [place] [address] [phone]       Discharge Medications    Medication List    STOP taking these medications       lisinopril 5 MG tablet  Commonly known as:  PRINIVIL,ZESTRIL      TAKE these medications       aspirin 81 MG tablet  Take 81 mg by mouth daily.     atorvastatin 80 MG tablet  Commonly known as:  LIPITOR  Take 0.5 tablets (40 mg total) by mouth daily at 6 PM.     brinzolamide 1 % ophthalmic suspension  Commonly known as:  AZOPT  Place 1 drop into both eyes every 12 (twelve) hours.     clopidogrel 75 MG tablet  Commonly known as:  PLAVIX  Take 1 tablet (75 mg total) by mouth daily.     hydrALAZINE 25 MG tablet  Commonly known as:  APRESOLINE  Take 1 tablet (25 mg total) by mouth 2 (two) times daily.     metoprolol succinate 50 MG 24 hr tablet  Commonly known as:  TOPROL-XL  Take 1 tablet (50 mg total) by mouth daily. Take with or immediately following a meal.     nitroGLYCERIN 0.4 MG SL tablet  Commonly known as:  NITROSTAT  Place 1 tablet (0.4 mg  total) under the tongue every 5 (five) minutes as needed. For chest pain.     temazepam 15 MG capsule  Commonly known as:  RESTORIL  Take 1 capsule (15 mg total) by mouth at bedtime as needed for sleep.     TRAVATAN Z 0.004 % Soln ophthalmic solution  Generic drug:  Travoprost (BAK Free)  Place 1 drop into both eyes at bedtime.        Outstanding Labs/Studies  [address] office follow up to obtain BMET on 06/13/2014  Duration of Discharge Encounter   Greater than 30 minutes including physician time.  Hilbert Corrigan PA 06/09/2014, 8:35 AM   I have seen and evaluated the patient this Almyra Deforest PA. I agree with his findings, examination as well as impression recommendations.  OP cath for exertional dyspnea & CP - the Sx have been progressively worsening in an Unstable Angina pattern --> noted to have ~75% ISR of RCA stent -- PTCA.  Renal  Fxn better with hydration -- hold ACE-I for now, use Hydralazine as he has Rxn to Amlodipine.  Will need ROV with me or Cecilie Kicks, NP-C  Otherwise ready for d/c.   Leonie Man, M.D., M.S. Interventional Cardiologist   Pager # [phone] 06/09/2014

## 2014-06-09 NOTE — Care Management Note (Addendum)
  Page 1 of 1   06/09/2014     2:54:58 PM CARE MANAGEMENT NOTE 06/09/2014  Patient:  Nicholas Porter, Nicholas Porter   Account Number:  [id number]  Date Initiated:  06/09/2014  Documentation initiated by:  Mariann Laster  Subjective/Objective Assessment:   CP     Action/Plan:   CM to follow for dispositon needs   Anticipated DC Date:  06/09/2014   Anticipated DC Plan:  HOME/SELF CARE         Choice offered to / List presented to:             Status of service:   Medicare Important Message given?   (If response is "NO", the following Medicare IM given date fields will be blank) Date Medicare IM given:   Date Additional Medicare IM given:    Discharge Disposition:  HOME/SELF CARE  Per UR Regulation:    If discussed at Long Length of Stay Meetings, dates discussed:    Comments:  Crystal Hutchinson RN, BSN, MSHL, CCM  Nurse - Case Manager, (Unit [phone]  06/09/2014 Med Review: Plavix

## 2014-06-09 NOTE — Progress Notes (Signed)
Patient Name: Nicholas Porter Date of Encounter: 06/09/2014     Principal Problem:   Unstable angina, relief with NTG Active Problems:   HYPERLIPIDEMIA   HYPERTENSION   CAD (coronary artery disease)   Orthostatic hypotension   Bradycardia    SUBJECTIVE  Denies any chest pain or SOB  CURRENT MEDS . aspirin EC  81 mg Oral Daily  . atorvastatin  40 mg Oral q1800  . brinzolamide  1 drop Both Eyes Q12H  . clopidogrel  75 mg Oral Daily  . metoprolol succinate  50 mg Oral Daily    OBJECTIVE  Filed Vitals:   06/08/14 2350 06/09/14 0000 06/09/14 0100 06/09/14 0613  BP: 154/60 146/44 136/49 165/53  Pulse: 56   55  Temp: 97.7 F (36.5 C)   97.4 F (36.3 C)  TempSrc: Oral   Oral  Resp: 18   20  Height:      Weight:    139 lb 12.4 oz (63.4 kg)  SpO2: 100%   100%    Intake/Output Summary (Last 24 hours) at 06/09/14 0729 Last data filed at 06/09/14 0615  Gross per 24 hour  Intake      0 ml  Output   1075 ml  Net  -1075 ml   Filed Weights   06/08/14 0743 06/09/14 0613  Weight: 139 lb (63.05 kg) 139 lb 12.4 oz (63.4 kg)    PHYSICAL EXAM  General: Pleasant, NAD. Neuro: Alert and oriented X 3. Moves all extremities spontaneously. Psych: Normal affect. HEENT:  Normal  Neck: Supple without bruits or JVD. Lungs:  Resp regular and unlabored, CTA. Heart: RRR no s3, s4, or murmurs. Abdomen: Soft, non-tender, non-distended, BS + x 4. R groin cath site appears clean, dry, intact, some bruising however no significant bleeding or hematoma Extremities: No clubbing, cyanosis or edema. DP/PT/Radials 2+ and equal bilaterally.  Accessory Clinical Findings  CBC  Recent Labs  06/07/14 1259 06/09/14 0335  WBC 8.1 5.3  HGB 13.1 10.2*  HCT 37.7* 30.2*  MCV 90.2 92.9  PLT 113* 76*   Basic Metabolic Panel  Recent Labs  06/08/14 1237 06/09/14 0335  NA 143 145  K 5.5* 4.3  CL 112 118*  CO2 15* 14*  GLUCOSE 86 161*  BUN 49* 43*  CREATININE 1.96* 1.75*  CALCIUM 9.0  8.4   Liver Function Tests No results found for this basename: AST, ALT, ALKPHOS, BILITOT, PROT, ALBUMIN,  in the last 72 hours No results found for this basename: LIPASE, AMYLASE,  in the last 72 hours Cardiac Enzymes No results found for this basename: CKTOTAL, CKMB, CKMBINDEX, TROPONINI,  in the last 72 hours BNP No components found with this basename: POCBNP,  D-Dimer No results found for this basename: DDIMER,  in the last 72 hours Hemoglobin A1C No results found for this basename: HGBA1C,  in the last 72 hours Fasting Lipid Panel No results found for this basename: CHOL, HDL, LDLCALC, TRIG, CHOLHDL, LDLDIRECT,  in the last 72 hours Thyroid Function Tests  Recent Labs  06/07/14 1259  TSH 3.709    TELE  Sinus brady with HR in 40s, occasional drop down to 30s.   ECG  Sinus brady with HR 50s, possible Q wave in V1-V4  Radiology/Studies  Dg Chest 2 View  06/07/2014   CLINICAL DATA:  Chest pain, pre cardiac catheterization  EXAM: CHEST  2 VIEW  COMPARISON:  08/18/2012  FINDINGS: Heart size and vascular pattern are normal. Lungs are clear. 1 cm sclerotic  lesion over left scapula, stable and likely not of acute clinical significance. Mild hyperinflation suggests an unchanged element of COPD.  IMPRESSION: No active cardiopulmonary disease.   Electronically Signed   By: Skipper Cliche M.D.   On: 06/07/2014 14:09    ASSESSMENT AND PLAN  1. Unstable angina, relieved with NTG  - Cath 06/08/2014 75% eccentric in-stent restenosis in previous RCA stent treated with cutting balloon  - need DAPT for 1 yrs  - stable for discharge with addition of hydralazine 25mg  BID for HTN  - ACEI off due to renal function, will need further monitor  2. CAD (coronary artery disease)  - Cath 2014 DES to 100% mid-RCA   3. Bradycardia  - asymptomatic, will continue BB for now  4. CKD, stage III  - labs on Monday to monitor kidney function  5. Hyperlipidemia  6. Orthostatic  hypotension   Signed, Almyra Deforest PA Pager: [phone]  I have seen and evaluated the patient this Almyra Deforest PA. I agree with his findings, examination as well as impression recommendations.  OP cath for exertional dyspnea & CP - the Sx have been progressively worsening in an Unstable Angina pattern --> noted to have ~75% ISR of RCA stent -- PTCA.  Renal Fxn better with hydration -- hold ACE-I for now, use Hydralazine as he has Rxn to Amlodipine.  Will need ROV with me or Cecilie Kicks, NP-C  Otherwise ready for d/c.   Leonie Man, M.D., M.S. Interventional Cardiologist   Pager # [phone] 06/09/2014

## 2014-06-13 ENCOUNTER — Other Ambulatory Visit (INDEPENDENT_AMBULATORY_CARE_PROVIDER_SITE_OTHER): Payer: Medicare Other

## 2014-06-13 DIAGNOSIS — I2510 Atherosclerotic heart disease of native coronary artery without angina pectoris: Secondary | ICD-10-CM

## 2014-06-13 DIAGNOSIS — N189 Chronic kidney disease, unspecified: Secondary | ICD-10-CM | POA: Diagnosis not present

## 2014-06-13 DIAGNOSIS — I213 ST elevation (STEMI) myocardial infarction of unspecified site: Secondary | ICD-10-CM

## 2014-06-13 DIAGNOSIS — N183 Chronic kidney disease, stage 3 unspecified: Secondary | ICD-10-CM

## 2014-06-13 DIAGNOSIS — H4011X0 Primary open-angle glaucoma, stage unspecified: Secondary | ICD-10-CM | POA: Diagnosis not present

## 2014-06-13 LAB — BASIC METABOLIC PANEL
BUN: 33 mg/dL — ABNORMAL HIGH (ref 6–23)
CO2: 22 meq/L (ref 19–32)
Calcium: 9 mg/dL (ref 8.4–10.5)
Chloride: 114 mEq/L — ABNORMAL HIGH (ref 96–112)
Creatinine, Ser: 1.8 mg/dL — ABNORMAL HIGH (ref 0.4–1.5)
GFR: 39.82 mL/min — AB (ref >60.00)
Glucose, Bld: 116 mg/dL — ABNORMAL HIGH (ref 70–99)
Potassium: 4 mEq/L (ref 3.5–5.1)
SODIUM: 140 meq/L (ref 135–145)

## 2014-06-14 ENCOUNTER — Encounter: Payer: Self-pay | Admitting: Cardiology

## 2014-06-14 ENCOUNTER — Ambulatory Visit (INDEPENDENT_AMBULATORY_CARE_PROVIDER_SITE_OTHER): Payer: Medicare Other | Admitting: Cardiology

## 2014-06-14 ENCOUNTER — Other Ambulatory Visit: Payer: Medicare Other

## 2014-06-14 VITALS — BP 150/60 | HR 61 | Ht 67.0 in | Wt 143.5 lb

## 2014-06-14 DIAGNOSIS — I200 Unstable angina: Secondary | ICD-10-CM | POA: Diagnosis not present

## 2014-06-14 DIAGNOSIS — Z9861 Coronary angioplasty status: Secondary | ICD-10-CM | POA: Diagnosis not present

## 2014-06-14 DIAGNOSIS — I10 Essential (primary) hypertension: Secondary | ICD-10-CM | POA: Diagnosis not present

## 2014-06-14 DIAGNOSIS — E785 Hyperlipidemia, unspecified: Secondary | ICD-10-CM | POA: Diagnosis not present

## 2014-06-14 DIAGNOSIS — I2510 Atherosclerotic heart disease of native coronary artery without angina pectoris: Secondary | ICD-10-CM

## 2014-06-14 DIAGNOSIS — I350 Nonrheumatic aortic (valve) stenosis: Secondary | ICD-10-CM

## 2014-06-14 DIAGNOSIS — Z79899 Other long term (current) drug therapy: Secondary | ICD-10-CM | POA: Diagnosis not present

## 2014-06-14 DIAGNOSIS — N183 Chronic kidney disease, stage 3 unspecified: Secondary | ICD-10-CM

## 2014-06-14 DIAGNOSIS — Z8711 Personal history of peptic ulcer disease: Secondary | ICD-10-CM

## 2014-06-14 DIAGNOSIS — I951 Orthostatic hypotension: Secondary | ICD-10-CM

## 2014-06-14 DIAGNOSIS — I498 Other specified cardiac arrhythmias: Secondary | ICD-10-CM

## 2014-06-14 DIAGNOSIS — I351 Nonrheumatic aortic (valve) insufficiency: Secondary | ICD-10-CM

## 2014-06-14 DIAGNOSIS — Z955 Presence of coronary angioplasty implant and graft: Secondary | ICD-10-CM

## 2014-06-14 DIAGNOSIS — I739 Peripheral vascular disease, unspecified: Secondary | ICD-10-CM

## 2014-06-14 DIAGNOSIS — I359 Nonrheumatic aortic valve disorder, unspecified: Secondary | ICD-10-CM

## 2014-06-14 DIAGNOSIS — I779 Disorder of arteries and arterioles, unspecified: Secondary | ICD-10-CM

## 2014-06-14 DIAGNOSIS — R001 Bradycardia, unspecified: Secondary | ICD-10-CM

## 2014-06-14 DIAGNOSIS — I2119 ST elevation (STEMI) myocardial infarction involving other coronary artery of inferior wall: Secondary | ICD-10-CM | POA: Diagnosis not present

## 2014-06-14 NOTE — Patient Instructions (Signed)
DRINK PLENTY OF FLUIDS  ONLY TAKE 1 TABLET OF ASPIRIN DAILY.  HAVE LABS DONE IN 2 WEEKS, COME TO OFFICE FOR BLOOD PRESSURE CHECK WITH NURSE VISIT   LABS IN 3 MONTHS --CMP ,LIPID -- WILL MAIL YOU A LAB SLIP IN AUG 2015  Your physician wants you to follow-up in 3 MONTHS DR HARDING. - GO OVER LAB WORK  You will receive a reminder letter in the mail two months in advance. If you don't receive a letter, please call our office to schedule the follow-up appointment.

## 2014-06-19 ENCOUNTER — Encounter: Payer: Self-pay | Admitting: Cardiology

## 2014-06-19 NOTE — Assessment & Plan Note (Signed)
With AI - annual Echo f/u to ensure stable.

## 2014-06-19 NOTE — Assessment & Plan Note (Signed)
Mostly angina free s/p PTCA.  He does have residual disease that has been stable for years with Negative Myoview.  If he stil lhas intermitted Sx - will start Imdur. On ASA - decrease dose to 81 mg while on Plavix On BB.  No ACE-I/ARB b/x CKD III -- on Hydralazine

## 2014-06-19 NOTE — Progress Notes (Signed)
PCP: Cathlean Cower, MD  Clinic Note: Chief Complaint  Patient presents with  . Follow-up    says some one called him and told him to take Asiprin BID, wants to know if thats too much. (no documentation of this phone call in EPIC) had an eps. of chest pressure yesterday, dyspnea with min. activity.     HPI: Nicholas Porter is a 80 y.o. male with a Cardiovascular Problem List below who presents today for post LHC-PTCA follow-up.  Was seen by Cecilie Kicks on 6/16 for SSx concerning for Unstable Angina.  I last saw him in Dec 2014 to review the results of his Post-STEMI Myoview that shoed inferior scar without ischemia.  Based upon his presentation with increased exertional dyspnea followed by several episodes of SSCP/angina during the night & with exertion.  He was found to have 75% ISR in the RCA DES -- treated with Cutting Balloon PTCA.   1. CAD S/P percutaneous coronary angioplasty - DES in RCA with PTCA for ISR; Moderate mLAD, 80% ostial OM2 stable   2. H/O Inferior STEMI (09/2013) emergent PCI with Promus DES to RCA (3.0 mm  x 28 mm - 3.2 mm)   3. Unstable angina, relief with NTG --> Cath with PTCA of RCA stent ISR   4. Presence of drug coated stent in right coronary artery; 75% ISR treated with PTCA 05/2014   5. Hyperlipidemia with target LDL less than 70; On Atorvastatin   6. Essential hypertension   7. Orthostatic hypotension   8. Mild aortic stenosis   9. Moderate aortic insufficiency   10. Encounter for long-term (current) use of other medications   11. Left-sided carotid artery disease -- s/p CEA   12. CKD (chronic kidney disease), stage III   13. PEPTIC ULCER DISEASE, HX OF    Interval History: He now presents with notable improvement in his exertional dyspnea & angina.  He has noted intermitted chest discomfort - a few nights, but not with walking.  He is a bit fatigued because he had to take his wife int to the hospital shortly after he got out for intractable diarrhea.  He still  has exertional dyspnea, but not as notable.  No resting dyspnea.  Occasionally dizzy in the morning when first waking up. No PND, orthopnea or edema.  No palpitations, lightheadedness, syncope/near syncope. No TIA/amaurosis fugax symptoms. No melena, hematochezia, hematuria, or epstaxis. No claudication.  Prior Cardiac Evaluation and Past Surgical History: Procedure Laterality Date  . Carotid endarterectomy Left 2007    Dr.Hayes  . Amputation      left index finger -tramatic  . Cardiac catheterization  November 2011    40% mid LAD, 50% proximal RCA, 30-40% distal RCA (DOMINANT RCA with wraparound PDA & 2 large PLBs), 60-70% ostial OM1. No change from 2001. Medical therapy  . Coronary angioplasty with stent placement  09/2013; 05/2014    Promus DES 3.0 mm x 28 mm (3.2 mm)  to RCA with STEMI; residual 70% mid-distal LAD, OM 270-80%. Medical management; b) PTCA of 75 % ISR , LAD lesion noted as ~40%  . Nm myoview ltd  11/2013    EF 59%, Fixed Inferior-Inferoapical Defect c/w Inferior Scar; No Ischemia (this was to evaluate the LAD Lesion0   MEDICATIONS AND ALLERGIES REVIEWED IN EPIC No Change in Social and Family History  ROS: A comprehensive Review of Systems - Negative except intermittent cough & HA.  AM dizziness.  PHYSICAL EXAM BP 150/60  Pulse 61  Ht  5\' 7"  (1.702 m)  Wt 143 lb 8 oz (65.091 kg)  BMI 22.47 kg/m2 General: alert, cooperative, appears stated age, no distress and Well-groomed. Well-nourished. Normal mood and affect. Answers questions appropriately  Neck: no adenopathy, no JVD, supple, symmetrical; No LAN.  Soft left-sided carotid bruit.  Lungs: clear to auscultation bilaterally, normal percussion bilaterally and Nonlabored, good air movement  Heart: RRR, normal S1 and S2. Soft 1-2/6 early peaking c-d SEM at RUSB. Faint diastolic murmur heard in same location  Abdomen: Soft with mild right upper quadrant and epigastric tenderness stopped sedation. NABS. No rebound    Extremities: extremities normal, atraumatic, no cyanosis or edema  Pulses: 2+ and symmetric  Skin: Skin color, texture, turgor normal. No rashes or lesions or Just mild bruising from being on antiplatelet agents.  Neurologic: Grossly normal  HEENT: [place]/AT, EOMI, MMM, anicteric sclera; arcus senilis   Adult ECG Report  Rate: 61 ;  Rhythm: normal sinus rhythm and normal intervals / durations.  Voltages: consistent with LVH; Other Abnormalities: Septal Qs - CRO septal infarct, age undetermined   Narrative Interpretation: stable   Recent Labs 6/22:   Cr 1.8 - stable (GFR 40) CKD III  CBC with Hgb 10.2 (was 13.1 pre-cath) -- suspect some dilution with hydration IVF  ASSESSMENT / PLAN: Unstable angina, relief with NTG --> Cath with PTCA of RCA stent ISR Mostly angina free s/p PTCA.  He does have residual disease that has been stable for years with Negative Myoview.  If he stil lhas intermitted Sx - will start Imdur. On ASA - decrease dose to 81 mg while on Plavix On BB.  No ACE-I/ARB b/x CKD III -- on Hydralazine  CAD S/P percutaneous coronary angioplasty - DES in RCA with PTCA for ISR; Moderate mLAD, 80% ostial OM2 stable On stable regimen for now.  May need Imdur for residual disease in the future.  See above.  H/O Inferior STEMI (09/2013) emergent PCI with Promus DES to RCA (3.0 mm  x 28 mm - 3.2 mm) Infarct noted on Moview -- but still had angina with ISR - indicates viable myocardium. No SSx of CHF.  Essential hypertension Somewhat stable - but a bit high.  Will continue current meds in light of CKD III & orthostatic hypotension.  Orthostatic hypotension AM dizziness -- recommend taking PM meds a bit earlier & AM meds a bit later (after breakfast).  Have a bottle / cup of water on the night stand to drink before getting up. No plan to increase BB or Hydralazine.  Moderate aortic insufficiency Hard to treat afterload as designed with orthostatic hypotension -- Currently no  change in LV function.  Will need to monitor. - 2 D Echo after 3 month f/u.   Mild aortic stenosis With AI - annual Echo f/u to ensure stable.  Hyperlipidemia with target LDL less than 70; On Atorvastatin Needs f/u labs -- will check CMP & Lipids prior to next visit.  CKD (chronic kidney disease), stage III Cr looked stable post cath-PTCA.  No SSx of CHF - can increase PO hydration.  Recheck BMP in ~2 weeks.    PEPTIC ULCER DISEASE, HX OF Decrease to 81 mg ASA -- after 1 yr of DAPT from DES - can d/c ASA    Orders Placed This Encounter  Procedures  . Basic metabolic panel  . Lipid panel    Standing Status: Future     Number of Occurrences:      Standing Expiration Date: 06/15/2015  Order Specific Question:  Has the patient fasted?    Answer:  Yes  . Comprehensive metabolic panel    Standing Status: Future     Number of Occurrences:      Standing Expiration Date: 06/15/2015    Order Specific Question:  Has the patient fasted?    Answer:  Yes    Followup: 3 months  DAVID W. Ellyn Hack, M.D., M.S. Interventional Cardiologist [facility]

## 2014-06-19 NOTE — Assessment & Plan Note (Signed)
Cr looked stable post cath-PTCA.  No SSx of CHF - can increase PO hydration.  Recheck BMP in ~2 weeks.

## 2014-06-19 NOTE — Assessment & Plan Note (Signed)
Somewhat stable - but a bit high.  Will continue current meds in light of CKD III & orthostatic hypotension.

## 2014-06-19 NOTE — Assessment & Plan Note (Signed)
Infarct noted on Moview -- but still had angina with ISR - indicates viable myocardium. No SSx of CHF.

## 2014-06-19 NOTE — Assessment & Plan Note (Signed)
On stable regimen for now.  May need Imdur for residual disease in the future.  See above.

## 2014-06-19 NOTE — Assessment & Plan Note (Signed)
AM dizziness -- recommend taking PM meds a bit earlier & AM meds a bit later (after breakfast).  Have a bottle / cup of water on the night stand to drink before getting up. No plan to increase BB or Hydralazine.

## 2014-06-19 NOTE — Assessment & Plan Note (Addendum)
Hard to treat afterload as designed with orthostatic hypotension -- Currently no change in LV function.  Will need to monitor. - 2 D Echo after 3 month f/u.

## 2014-06-19 NOTE — Assessment & Plan Note (Signed)
Decrease to 81 mg ASA -- after 1 yr of DAPT from DES - can d/c ASA

## 2014-06-19 NOTE — Assessment & Plan Note (Signed)
Needs f/u labs -- will check CMP & Lipids prior to next visit.

## 2014-06-21 ENCOUNTER — Encounter: Payer: Self-pay | Admitting: *Deleted

## 2014-06-21 ENCOUNTER — Telehealth: Payer: Self-pay | Admitting: Cardiology

## 2014-06-21 NOTE — Telephone Encounter (Signed)
Pt right anklel is still swollen ,he stays off of it and keeps it elevated.. What can he do for it except ice?

## 2014-06-22 NOTE — Telephone Encounter (Signed)
pts wife stated ankle was doing much better and i advised him to keep it elevated and if it doesn't get better to consult PCP

## 2014-06-23 ENCOUNTER — Telehealth: Payer: Self-pay | Admitting: Cardiology

## 2014-06-23 NOTE — Telephone Encounter (Signed)
Pt was discharged from the hospital on 06-09-14. She wants to know if there are any restrictions on what he can eat?

## 2014-06-23 NOTE — Telephone Encounter (Signed)
Calling to see what type diet he should be on. Told him to stay on a low fat, low salt diet.  Voiced understanding.

## 2014-06-27 ENCOUNTER — Ambulatory Visit (INDEPENDENT_AMBULATORY_CARE_PROVIDER_SITE_OTHER): Payer: Medicare Other | Admitting: *Deleted

## 2014-06-27 ENCOUNTER — Ambulatory Visit (HOSPITAL_COMMUNITY)
Admission: RE | Admit: 2014-06-27 | Discharge: 2014-06-27 | Disposition: A | Discharge status: Home / Self Care | Payer: Medicare Other | Source: Ambulatory Visit | Attending: Cardiology | Admitting: Cardiology

## 2014-06-27 ENCOUNTER — Telehealth: Payer: Self-pay | Admitting: Cardiology

## 2014-06-27 VITALS — BP 120/58 | HR 60 | Ht 67.0 in | Wt 140.0 lb

## 2014-06-27 DIAGNOSIS — M7989 Other specified soft tissue disorders: Secondary | ICD-10-CM | POA: Diagnosis not present

## 2014-06-27 DIAGNOSIS — L539 Erythematous condition, unspecified: Secondary | ICD-10-CM

## 2014-06-27 DIAGNOSIS — R238 Other skin changes: Secondary | ICD-10-CM | POA: Insufficient documentation

## 2014-06-27 DIAGNOSIS — R609 Edema, unspecified: Secondary | ICD-10-CM

## 2014-06-27 DIAGNOSIS — Z79899 Other long term (current) drug therapy: Secondary | ICD-10-CM | POA: Diagnosis not present

## 2014-06-27 LAB — BASIC METABOLIC PANEL
BUN: 32 mg/dL — AB (ref 6–23)
CHLORIDE: 113 meq/L — AB (ref 96–112)
CO2: 21 mEq/L (ref 19–32)
Calcium: 9.1 mg/dL (ref 8.4–10.5)
Creat: 1.91 mg/dL — ABNORMAL HIGH (ref 0.50–1.35)
Glucose, Bld: 86 mg/dL (ref 70–99)
POTASSIUM: 4.5 meq/L (ref 3.5–5.3)
Sodium: 143 mEq/L (ref 135–145)

## 2014-06-27 NOTE — Patient Instructions (Signed)
Continue current medications.  Your physician has requested that you have a lower or upper extremity venous duplex. This test is an ultrasound of the veins in the legs or arms. It looks at venous blood flow that carries blood from the heart to the legs or arms. Allow one hour for a Lower Venous exam. Allow thirty minutes for an Upper Venous exam. There are no restrictions or special instructions.

## 2014-06-27 NOTE — Progress Notes (Signed)
Right Lower Ext. Venous Duplex Completed. Negative for DVT. Oda Cogan, BS, RDMS, RVT

## 2014-06-27 NOTE — Telephone Encounter (Signed)
Left message to call back  

## 2014-06-27 NOTE — Telephone Encounter (Signed)
Answering serivce  call on 06/26/14 . His foot is swollen and hurting after his surgery.  He cant walk on it.  Please call

## 2014-06-27 NOTE — Telephone Encounter (Signed)
Patient walked-in  Patient was seen and assisted by Pamala Hurry L. CMA

## 2014-06-28 NOTE — Progress Notes (Signed)
Came in for BP check.  Noted to have right ankle redness and soreness with mild swelling and slight warmth to touch.  Scheduled for venous doppler 12:30pm.  Per Velva Harman doppler negative for DVT.

## 2014-07-01 ENCOUNTER — Telehealth: Payer: Self-pay | Admitting: *Deleted

## 2014-07-01 NOTE — Telephone Encounter (Signed)
Message copied by Raiford Simmonds on Fri Jul 01, 2014  9:45 AM ------      Message from: Leonie Man      Created: Thu Jun 30, 2014  8:58 AM       Kidney function seems to have stabilized. Still seems a bit "dry". Continue to increase PO fluid intake.            HARDING,DAVID W       ------

## 2014-07-01 NOTE — Telephone Encounter (Signed)
Spoke to patient. Result given . Verbalized understanding  

## 2014-07-13 ENCOUNTER — Other Ambulatory Visit: Payer: Self-pay

## 2014-07-13 MED ORDER — CLOPIDOGREL BISULFATE 75 MG PO TABS: 75.0000 mg | ORAL_TABLET | Freq: Every day | ORAL | Status: DC

## 2014-07-13 NOTE — Telephone Encounter (Signed)
Rx was sent to pharmacy electronically. 

## 2014-07-14 ENCOUNTER — Other Ambulatory Visit: Payer: Self-pay

## 2014-07-14 MED ORDER — CLOPIDOGREL BISULFATE 75 MG PO TABS: 75.0000 mg | ORAL_TABLET | Freq: Every day | ORAL | Status: DC

## 2014-07-14 NOTE — Telephone Encounter (Signed)
Rx was sent to pharmacy electronically. 

## 2014-07-20 DIAGNOSIS — H18519 Endothelial corneal dystrophy, unspecified eye: Secondary | ICD-10-CM | POA: Diagnosis not present

## 2014-08-03 NOTE — Addendum Note (Signed)
Addended by: Vear Clock on: 08/03/2014 01:33 PM   Modules accepted: Orders

## 2014-08-05 ENCOUNTER — Telehealth: Payer: Self-pay | Admitting: *Deleted

## 2014-08-05 DIAGNOSIS — Z79899 Other long term (current) drug therapy: Secondary | ICD-10-CM

## 2014-08-05 DIAGNOSIS — E785 Hyperlipidemia, unspecified: Secondary | ICD-10-CM

## 2014-08-05 NOTE — Telephone Encounter (Signed)
MAIL LETTER AND LABSLIP CMP ,LIPID

## 2014-08-05 NOTE — Telephone Encounter (Signed)
Message copied by Raiford Simmonds on Fri Aug 05, 2014 12:54 PM ------      Message from: Raiford Simmonds      Created: Tue Jun 14, 2014 12:44 PM       LABS CMP,LIPID DONE SEPT 2015      MAIL I July 2015 ------

## 2014-08-09 ENCOUNTER — Other Ambulatory Visit (INDEPENDENT_AMBULATORY_CARE_PROVIDER_SITE_OTHER): Payer: Medicare Other

## 2014-08-09 ENCOUNTER — Ambulatory Visit (INDEPENDENT_AMBULATORY_CARE_PROVIDER_SITE_OTHER): Payer: Medicare Other | Admitting: Internal Medicine

## 2014-08-09 ENCOUNTER — Encounter: Payer: Self-pay | Admitting: Internal Medicine

## 2014-08-09 VITALS — BP 118/66 | HR 69 | Temp 98.1°F | Wt 134.4 lb

## 2014-08-09 DIAGNOSIS — I200 Unstable angina: Secondary | ICD-10-CM

## 2014-08-09 DIAGNOSIS — Z872 Personal history of diseases of the skin and subcutaneous tissue: Secondary | ICD-10-CM

## 2014-08-09 DIAGNOSIS — N183 Chronic kidney disease, stage 3 unspecified: Secondary | ICD-10-CM | POA: Diagnosis not present

## 2014-08-09 DIAGNOSIS — R233 Spontaneous ecchymoses: Secondary | ICD-10-CM

## 2014-08-09 DIAGNOSIS — Z87898 Personal history of other specified conditions: Secondary | ICD-10-CM

## 2014-08-09 DIAGNOSIS — D696 Thrombocytopenia, unspecified: Secondary | ICD-10-CM | POA: Insufficient documentation

## 2014-08-09 LAB — CBC WITH DIFFERENTIAL/PLATELET
BASOS PCT: 0.4 % (ref 0.0–3.0)
Basophils Absolute: 0 10*3/uL (ref 0.0–0.1)
EOS ABS: 0.3 10*3/uL (ref 0.0–0.7)
Eosinophils Relative: 3.8 % (ref 0.0–5.0)
HCT: 33.9 % — ABNORMAL LOW (ref 39.0–52.0)
HEMOGLOBIN: 11.3 g/dL — AB (ref 13.0–17.0)
Lymphocytes Relative: 32.9 % (ref 12.0–46.0)
Lymphs Abs: 2.3 10*3/uL (ref 0.7–4.0)
MCHC: 33.3 g/dL (ref 30.0–36.0)
MCV: 91.6 fl (ref 78.0–100.0)
MONO ABS: 0.8 10*3/uL (ref 0.1–1.0)
Monocytes Relative: 10.9 % (ref 3.0–12.0)
NEUTROS ABS: 3.6 10*3/uL (ref 1.4–7.7)
Neutrophils Relative %: 52 % (ref 43.0–77.0)
Platelets: 125 10*3/uL — ABNORMAL LOW (ref 150.0–400.0)
RBC: 3.7 Mil/uL — ABNORMAL LOW (ref 4.22–5.81)
RDW: 14.2 % (ref 11.5–15.5)
WBC: 7 10*3/uL (ref 4.0–10.5)

## 2014-08-09 LAB — PROTIME-INR
INR: 1 ratio (ref 0.8–1.0)
PROTHROMBIN TIME: 11.6 s (ref 9.6–13.1)

## 2014-08-09 LAB — HEPATIC FUNCTION PANEL
ALBUMIN: 3.7 g/dL (ref 3.5–5.2)
ALT: 13 U/L (ref 0–53)
AST: 19 U/L (ref 0–37)
Alkaline Phosphatase: 84 U/L (ref 39–117)
Bilirubin, Direct: 0.1 mg/dL (ref 0.0–0.3)
TOTAL PROTEIN: 6.7 g/dL (ref 6.0–8.3)
Total Bilirubin: 0.7 mg/dL (ref 0.2–1.2)

## 2014-08-09 LAB — APTT: aPTT: 28.1 s (ref 23.4–32.7)

## 2014-08-09 NOTE — Progress Notes (Signed)
Pre visit review using our clinic review tool, if applicable. No additional management support is needed unless otherwise documented below in the visit note. 

## 2014-08-09 NOTE — Patient Instructions (Addendum)
  Plain Allegra (NOT D )  160 daily , Loratidine 10 mg , OR Zyrtec 10 mg @ bedtime  as needed for itching. Your next office appointment will be determined based upon review of your pending labs. Those instructions will be transmitted to you  by mail.

## 2014-08-09 NOTE — Progress Notes (Signed)
   Subjective:    Patient ID: Nicholas Porter, male    DOB: 07-19-1930, 80 y.o.   MRN: [id number]  HPI  He presents with  ecchymoses mainly of the upper extremities which has been present since June when he restarted 81 mg of aspirin at the instructions of his Cardiologist. It has progressed and as of 08/08/14 he's had some associated itching in areas of bruising.  He is on clopidogrel 75 mg daily. He denies taking supplements such as garlic or vitamin E. He also denies intake of any nonsteroidals.  He previously had some nosebleeds with baby aspirin but not recently.  Platelet # [id number] on 06/09/14. H/H 10.2/30.2.  He states he has no trouble stopping bleeding but bleeds easily.   Review of Systems  He has had some chills. He has lost almost 10 pounds since his cardiac admission in June.  He describes muscle cramping only in the left lower extremity at night.   He has a history of some renal insufficiency no hepatic dysfunction.  He denies associated itchy, watery eyes, sneezing, or angioedema symptoms.  He does describe some dysphagia if he rushes his meals.         Objective:   Physical Exam  Pertinent or positive findings include: Pattern alopecia is present He is edentulous but is wearing only the upper plate. There is a brisk left carotid bruit present .Grade 1/2 systolic murmur suggested He has mixed DIP/PIP joint changes. The left index finger is amputated. The left thumb nail is deformed. Pedal pulses are decreased but equal. He has diffuse large patches of ecchymosis over the upper extremities without active bleeding.  General appearance :adequately nourished; in no distress. Eyes: No conjunctival inflammation or scleral icterus is present. Oral exam: Lips and gums are healthy appearing.There is no oropharyngeal erythema or exudate noted.  Heart:  Normal rate and regular rhythm. S1 and S2 normal without gallop,  click, rub or other extra sounds   Lungs:Chest  clear to auscultation; no wheezes, rhonchi,rales ,or rubs present.No increased work of breathing.  Abdomen: bowel sounds normal, soft and non-tender without masses, organomegaly or hernias noted.  No guarding or rebound. No flank tenderness to percussion. Skin:Warm & dry.  Intact without suspicious lesions or rashes ; no jaundice or tenting Lymphatic: No lymphadenopathy is noted about the head, neck, axilla                Assessment & Plan:  #1 ecchymoses most likely related to aging changes of the skin and subcutaneous tissues in the context of combined low dose aspirin and clopidogrel and #2 #2 TCP #3 pruritis without definite extrinsic process  Plan: See orders and recommendations

## 2014-08-15 ENCOUNTER — Telehealth: Payer: Self-pay | Admitting: Cardiology

## 2014-08-15 ENCOUNTER — Telehealth: Payer: Self-pay | Admitting: Internal Medicine

## 2014-08-15 DIAGNOSIS — D696 Thrombocytopenia, unspecified: Secondary | ICD-10-CM

## 2014-08-15 NOTE — Telephone Encounter (Signed)
Pt's wife Opal Sidles called wanting help with better understanding the results to some blood work that he recently had. Please call  Thanks

## 2014-08-15 NOTE — Telephone Encounter (Signed)
Pt's wife "Opal Sidles" wants you to call and explain the blood work

## 2014-08-15 NOTE — Telephone Encounter (Signed)
Pt's spouse call stated that Mr. Dawdy nose is bleeding. Pt was advise to repeat plat late count if there is anything abnormal bruise or bleeding. Please advise. Please pt's spouse [phone]

## 2014-08-15 NOTE — Telephone Encounter (Signed)
CBC    Use a damp washrag to clamp the nose externally for 5 minutes. If the bleeding can not  be stopped; go to emergency room.

## 2014-08-15 NOTE — Telephone Encounter (Signed)
Pt's wife Opal Sidles wants you to call with a better explaination of the blood work

## 2014-08-16 ENCOUNTER — Telehealth: Payer: Self-pay | Admitting: Cardiology

## 2014-08-16 NOTE — Telephone Encounter (Signed)
Returned a call to patient's wife. She had some questions about some comments made on patient's lab results that she received from Dr. Linna Darner. Explained to her repeatedly that she will need to call Dr. Clayborn Heron office to ask his Nurse/ CMA the questions that she was asking me. I do not know why Dr. Linna Darner reported to her the results the way he did. She wants to know if Dr. Linna Darner has spoken with Dr. Ellyn Hack. I again explained to her that I have no way of knowing this. There is not a comment or phone note in the computer of a phone conversation between them. She will need to call Dr. Clayborn Heron office to find out the status of his plan.

## 2014-08-16 NOTE — Telephone Encounter (Signed)
Please call,she have something she wants you to explain to her that was written on his lab results. Please call before 2 today if possible.

## 2014-08-16 NOTE — Telephone Encounter (Signed)
Pt notified and lab orders placed.  °

## 2014-08-17 ENCOUNTER — Telehealth: Payer: Self-pay

## 2014-08-17 ENCOUNTER — Other Ambulatory Visit (INDEPENDENT_AMBULATORY_CARE_PROVIDER_SITE_OTHER): Payer: Medicare Other

## 2014-08-17 DIAGNOSIS — D696 Thrombocytopenia, unspecified: Secondary | ICD-10-CM | POA: Diagnosis not present

## 2014-08-17 LAB — CBC
HCT: 34.6 % — ABNORMAL LOW (ref 39.0–52.0)
Hemoglobin: 11.5 g/dL — ABNORMAL LOW (ref 13.0–17.0)
MCHC: 33.2 g/dL (ref 30.0–36.0)
MCV: 91.2 fl (ref 78.0–100.0)
Platelets: 127 10*3/uL — ABNORMAL LOW (ref 150.0–400.0)
RBC: 3.79 Mil/uL — ABNORMAL LOW (ref 4.22–5.81)
RDW: 14.3 % (ref 11.5–15.5)
WBC: 6.8 10*3/uL (ref 4.0–10.5)

## 2014-08-17 NOTE — Telephone Encounter (Signed)
Called the patient informed of MD response to questions.  They did schedule a follow-up with Dr. Jenny Reichmann as patient canceled last scheduled OV.

## 2014-08-17 NOTE — Telephone Encounter (Signed)
Bruising to arms is very common in persons over 80 due to thin skin, and aspirin use.  OK to continue the ASA though, as the benefit of reducing heart attack or stroke is much more helpful.  Stage 3 kidney disease is when blood flow to the kidneys is less than 60 cc/min, is very common and simply needs to be followed. Any worsening than this most often needs a referral to kidney specialist (unless your physician has done this already).  Many time the kidney function is checked at least twice per yr with this. There is no specific treatment, except to avoid anything that might hurt the kidneys, like even OTC alleve or advil, which occas make this worse for some people

## 2014-08-17 NOTE — Telephone Encounter (Signed)
Patient came in today with several questions.  States has been bleeding, bruising easily.  Unsure of why.  Had recent OV with Dr. Linna Darner and has been unable to speak to anyone regarding this problem.  Also was not sure to continue Aspirin.  Also would like explanation regarding his kidney function and what stage 3 means.  Advise

## 2014-08-26 ENCOUNTER — Other Ambulatory Visit (INDEPENDENT_AMBULATORY_CARE_PROVIDER_SITE_OTHER): Payer: Medicare Other

## 2014-08-26 ENCOUNTER — Ambulatory Visit (INDEPENDENT_AMBULATORY_CARE_PROVIDER_SITE_OTHER): Payer: Medicare Other | Admitting: Internal Medicine

## 2014-08-26 ENCOUNTER — Encounter: Payer: Self-pay | Admitting: Internal Medicine

## 2014-08-26 VITALS — BP 138/80 | HR 69 | Temp 98.2°F | Wt 135.1 lb

## 2014-08-26 DIAGNOSIS — E785 Hyperlipidemia, unspecified: Secondary | ICD-10-CM | POA: Diagnosis not present

## 2014-08-26 DIAGNOSIS — N183 Chronic kidney disease, stage 3 unspecified: Secondary | ICD-10-CM

## 2014-08-26 DIAGNOSIS — R040 Epistaxis: Secondary | ICD-10-CM

## 2014-08-26 DIAGNOSIS — I200 Unstable angina: Secondary | ICD-10-CM

## 2014-08-26 DIAGNOSIS — Z23 Encounter for immunization: Secondary | ICD-10-CM

## 2014-08-26 DIAGNOSIS — I10 Essential (primary) hypertension: Secondary | ICD-10-CM | POA: Diagnosis not present

## 2014-08-26 LAB — BASIC METABOLIC PANEL
BUN: 46 mg/dL — AB (ref 6–23)
CHLORIDE: 113 meq/L — AB (ref 96–112)
CO2: 18 meq/L — AB (ref 19–32)
CREATININE: 2.1 mg/dL — AB (ref 0.4–1.5)
Calcium: 9.1 mg/dL (ref 8.4–10.5)
GFR: 32.97 mL/min — ABNORMAL LOW (ref >60.00)
GLUCOSE: 97 mg/dL (ref 70–99)
Potassium: 5.4 mEq/L — ABNORMAL HIGH (ref 3.5–5.1)
Sodium: 138 mEq/L (ref 135–145)

## 2014-08-26 LAB — CBC WITH DIFFERENTIAL/PLATELET
BASOS ABS: 0 10*3/uL (ref 0.0–0.1)
Basophils Relative: 0.4 % (ref 0.0–3.0)
Eosinophils Absolute: 0.2 10*3/uL (ref 0.0–0.7)
Eosinophils Relative: 3.1 % (ref 0.0–5.0)
HCT: 35.5 % — ABNORMAL LOW (ref 39.0–52.0)
HEMOGLOBIN: 11.9 g/dL — AB (ref 13.0–17.0)
Lymphocytes Relative: 26.9 % (ref 12.0–46.0)
Lymphs Abs: 1.9 10*3/uL (ref 0.7–4.0)
MCHC: 33.5 g/dL (ref 30.0–36.0)
MCV: 89.6 fl (ref 78.0–100.0)
MONOS PCT: 10.7 % (ref 3.0–12.0)
Monocytes Absolute: 0.8 10*3/uL (ref 0.1–1.0)
NEUTROS ABS: 4.2 10*3/uL (ref 1.4–7.7)
Neutrophils Relative %: 58.9 % (ref 43.0–77.0)
PLATELETS: 135 10*3/uL — AB (ref 150.0–400.0)
RBC: 3.96 Mil/uL — ABNORMAL LOW (ref 4.22–5.81)
RDW: 14 % (ref 11.5–15.5)
WBC: 7.1 10*3/uL (ref 4.0–10.5)

## 2014-08-26 LAB — LIPID PANEL
CHOLESTEROL: 134 mg/dL (ref 0–200)
HDL: 35.9 mg/dL — ABNORMAL LOW (ref >39.00)
LDL Cholesterol: 76 mg/dL (ref 0–99)
NONHDL: 98.1
TRIGLYCERIDES: 110 mg/dL (ref 0.0–149.0)
Total CHOL/HDL Ratio: 4
VLDL: 22 mg/dL (ref 0.0–40.0)

## 2014-08-26 LAB — URINALYSIS, ROUTINE W REFLEX MICROSCOPIC
Bilirubin Urine: NEGATIVE
Hgb urine dipstick: NEGATIVE
Ketones, ur: NEGATIVE
LEUKOCYTES UA: NEGATIVE
Nitrite: NEGATIVE
SPECIFIC GRAVITY, URINE: 1.02 (ref 1.000–1.030)
Total Protein, Urine: NEGATIVE
UROBILINOGEN UA: 0.2 (ref 0.0–1.0)
Urine Glucose: NEGATIVE
pH: 5.5 (ref 5.0–8.0)

## 2014-08-26 LAB — HEPATIC FUNCTION PANEL
ALBUMIN: 3.8 g/dL (ref 3.5–5.2)
ALK PHOS: 90 U/L (ref 39–117)
ALT: 13 U/L (ref 0–53)
AST: 18 U/L (ref 0–37)
Bilirubin, Direct: 0.1 mg/dL (ref 0.0–0.3)
TOTAL PROTEIN: 7 g/dL (ref 6.0–8.3)
Total Bilirubin: 0.8 mg/dL (ref 0.2–1.2)

## 2014-08-26 LAB — TSH: TSH: 3.14 u[IU]/mL (ref 0.35–4.50)

## 2014-08-26 NOTE — Progress Notes (Signed)
Pre visit review using our clinic review tool, if applicable. No additional management support is needed unless otherwise documented below in the visit note. 

## 2014-08-26 NOTE — Patient Instructions (Addendum)
You had the flu shot today  Please return in 2 wks for a Nurse Visit for the new Prevnar pneumonia shot  You will be contacted regarding the referral for: ENT - Dr Lucia Gaskins  Please continue all other medications as before, and refills have been done if requested.  Please have the pharmacy call with any other refills you may need.  Please continue your efforts at being more active, low cholesterol diet, and weight control.  You are otherwise up to date with prevention measures today.  Please keep your appointments with your specialists as you may have planned - Dr Ellyn Hack  Please go to the LAB in the Basement (turn left off the elevator) for the tests to be done today  You will be contacted by phone if any changes need to be made immediately.  Otherwise, you will receive a letter about your results with an explanation, but please check with MyChart first.  Please remember to sign up for MyChart if you have not done so, as this will be important to you in the future with finding out test results, communicating by private email, and scheduling acute appointments online when needed.  Please return in 6 months, or sooner if needed

## 2014-08-26 NOTE — Progress Notes (Signed)
Subjective:    Patient ID: Nicholas Porter, male    DOB: [birth year], 80 y.o.   MRN: [id number]  HPI  Here to f/u, on asa/plaivx, s/p labs recently per Linna Darner not suggestive of liver dz or bleeding diathesis.  Mild anemia relatively stable recently, no other bleeding or bruising except for left and right arms.  Mowed the yard recently. On plavix since oct 2014, initial plan was for one yr tx, has f/u planned with Dr Ellyn Hack. Still with recurring small but significant nosebleeds, has seen Dr Rogelia Rohrer for other in past.  Chronic back pain much improved recently, in fact denies any pain, very pleased he can be as active as he is, only with some aching briefly in the AM.  Does have some lightheaded with first up in the AM, one day lasted for 1 hr, but usually only a few minutes. Mowed the yard yesterday.  Has lost some wt, now 135 (peak wt 160, about 145 last yr).  Denies worsening reflux, abd pain, dysphagia, n/v, bowel change or blood.  Pt denies fever,  night sweats, loss of appetite, or other constitutional symptoms.  "I feel like a lot of energy lately."  Does have occas leg cramp nonexertional. Pt denies chest pain, increased sob or doe, wheezing, orthopnea, PND, increased LE swelling, palpitations, dizziness or syncope.  Pt denies new neurological symptoms such as new headache, or facial or extremity weakness or numbness   Pt denies polydipsia, polyuria, Past Medical History  Diagnosis Date  . CAD in native artery 2001; 2011; 2014; 05/2014    a) 2001 & 2011 -- Med Rx for: 60-70% ostial Cx-OM lesion, 40-50% lesions in the LAD and RC; b) Persantine Myoview 2012: No ischemia or infarction, EF 66% c) 10/'14: RCA 100% - PCI,CxOM stable, LAD ~70% mid; d) UA 6/'15: 75% ISR in RCA - PTCA, LAD lesion ~40%, CxOM stable.    . ST elevation myocardial infarction (STEMI) of inferior wall, emergent PCI with Promus DES to RCA 10/05/2013    100% mRCA occlusion; residual 80% OM2, ~70% LAD --> PCI to RCA  . CAD S/P  percutaneous coronary angioplasty 10/05/2013; 05/2014    a) 10/'14: PCI to RCA - Promus Premier DES 3.0 mm x 28 mm to mRCA (~3.19 mm);; b) 6/'15: Cutting Balloon PTCA of 75 % RCA ISR  . Moderate aortic insufficiency February 2012    Echocardiogram: EF > 55%; impaired relaxation. Mild/moderate MAC with mild MR; Moderate Aortic Regurgitation and mild sclerosis/ NO stenosis; mild - mod Pulm HTN (40-45mmHg);; Follow-up 05/2013: EF 65-70%, PAP ~31 mmH, otherwise no change  . Mild aortic stenosis  10/05/2013    Echo post STEMI: EF 55-60%. No regional W. MA. Mild aortic stenosis with mild regurgitation. MAC. Moderate pulmonary hypertension, 58 mmHg.  . Carotid artery stenosis  December 2012    s/p L CEA  . Hypertension, benign   . Hyperlipidemia LDL goal <70   . CKD (chronic kidney disease), stage III   . COPD (chronic obstructive pulmonary disease)  12/07/2011  . Thrombocytopenia, acquired 06/30/2007  . Anemia, B12 deficiency 11/04/2008  . Personal history of peptic ulcer disease 06/30/2007  . GERD (gastroesophageal reflux disease) 06/30/2007  . Irritable bowel syndrome (IBS)  12/08/2008  . Gallstones  12/07/2011  . Hernia, hiatal  08/25/2008  . 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 History of colonic polyps 08/25/2008  . Diverticulosis of colon 12/08/2008  . Osteoarthritis 05/22/2008; 06/30/2007     with chronic upper and lumbar back pain  as well as neck pain.  . Chronic back pain 12/06/2011  . Neck pain, chronic 12/06/2011  . Osteopenia 10/18/2008  . Hypothyroidism 10/18/2008  . Finger amputation, traumatic   . Personal history of TIA (transient ischemic attack) 2007   Past Surgical History  Procedure Laterality Date  . Foot surgery      left  . Carpal tunnel release    . Hernia repair    . Total knee arthroplasty    . Carotid endarterectomy Left 2007    Dr.Hayes  . Amputation      left index finger -tramatic  . Vagotomy      partial gastrectomy  . Partial gastrectomy    . Corneal  transplant    . Rotator cuff repair      left  . Spine surgery  1985    cervical laminectomy  . Cardiac catheterization  November 2011    40% mid LAD, 50% proximal RCA, 30-40% distal RCA (DOMINANT RCA with wraparound PDA & 2 large PLBs), 60-70% ostial OM1. No change from 2001. Medical therapy  . Shoulder arthroscopy with rotator cuff repair and subacromial decompression Right 07/01/2013    Procedure: RIGHT SHOULDER ARTHROSCOPY WITH  SUBACROMIAL DECOMPRESSION/DISTAL CLAVICLE RESECTION/ROTATOR CUFF REPAIR ;  Surgeon: Marin Shutter, MD;  Location: [facility];  Service: Orthopedics;  Laterality: Right;  . Coronary angioplasty with stent placement  09/2013; 05/2014    Promus DES 3.0 mm x 28 mm (3.2 mm)  to RCA with STEMI; residual 70% mid-distal LAD, OM 270-80%. Medical management; b) PTCA of 75 % ISR , LAD lesion noted as ~40%  . Nm myoview ltd  11/2013    EF 59%, Fixed Inferior-Inferoapical Defect c/w Inferior Scar; No Ischemia (this was to evaluate the LAD Lesion0    reports that he quit smoking about 49 years ago. His smoking use included Cigarettes. He smoked 0.00 packs per day. He has never used smokeless tobacco. He reports that he does not drink alcohol or use illicit drugs. family history includes Diabetes in his sister; Heart disease in his sister. Allergies  Allergen Reactions  . Amlodipine Besylate Hives  . Lipitor [Atorvastatin] Itching  . Naproxen Diarrhea  . Oxycodone-Acetaminophen Other (See Comments)    REACTION: unspecified   Current Outpatient Prescriptions on File Prior to Visit  Medication Sig Dispense Refill  . aspirin 81 MG tablet Take 81 mg by mouth daily.      . brinzolamide (AZOPT) 1 % ophthalmic suspension Place 1 drop into both eyes every 12 (twelve) hours.       . clopidogrel (PLAVIX) 75 MG tablet Take 1 tablet (75 mg total) by mouth daily.  30 tablet  11  . metoprolol succinate (TOPROL-XL) 50 MG 24 hr tablet Take 1 tablet (50 mg total) by mouth daily. Take with or  immediately following a meal.  90 tablet  3  . naphazoline-glycerin (CLEAR EYES) 0.012-0.2 % SOLN Place 1-2 drops into the left eye every 4 (four) hours as needed for irritation.      . nitroGLYCERIN (NITROSTAT) 0.4 MG SL tablet Place 1 tablet (0.4 mg total) under the tongue every 5 (five) minutes as needed. For chest pain.  25 tablet  6  . TRAVATAN Z 0.004 % SOLN ophthalmic solution Place 1 drop into both eyes at bedtime.        No current facility-administered medications on file prior to visit.   Review of Systems  Constitutional: Negative for unusual diaphoresis or other sweats  HENT: Negative  for ringing in ear Eyes: Negative for double vision or worsening visual disturbance.  Respiratory: Negative for choking and stridor.   Gastrointestinal: Negative for vomiting or other signifcant bowel change Genitourinary: Negative for hematuria or decreased urine volume.  Musculoskeletal: Negative for other MSK pain or swelling Skin: Negative for color change and worsening wound.  Neurological: Negative for tremors and numbness other than noted  Psychiatric/Behavioral: Negative for decreased concentration or agitation other than above       Objective:   Physical Exam BP 138/80  Pulse 69  Temp(Src) 98.2 F (36.8 C) (Oral)  Wt 135 lb 2 oz (61.292 kg)  SpO2 98% VS noted,  Constitutional: Pt appears well-developed, well-nourished.  HENT: Head: NCAT.  Right Ear: External ear normal.  Left Ear: External ear normal.  Eyes: . Pupils are equal, round, and reactive to light. Conjunctivae and EOM are normal Neck: Normal range of motion. Neck supple.  Cardiovascular: Normal rate and regular rhythm.   Pulmonary/Chest: Effort normal and breath sounds normal.  Abd:  Soft, NT, ND, + BS Neurological: Pt is alert. Not confused , motor grossly intact Skin: Skin is warm. No rash Psychiatric: Pt behavior is normal. No agitation.     Assessment & Plan:

## 2014-08-27 NOTE — Assessment & Plan Note (Signed)
stable overall by history and exam, recent data reviewed with pt, and pt to continue medical treatment as before,  to f/u any worsening symptoms or concerns BP Readings from Last 3 Encounters:  08/26/14 138/80  08/09/14 118/66  06/27/14 120/58

## 2014-08-27 NOTE — Assessment & Plan Note (Signed)
stable overall by history and exam, recent data reviewed with pt, and pt to continue medical treatment as before,  to f/u any worsening symptoms or concerns Lab Results  Component Value Date   LDLCALC 76 08/26/2014

## 2014-08-27 NOTE — Assessment & Plan Note (Signed)
Likely some related to asa/plavix use, but also will need referral ENT for possible cauterization due to ongoing recurrence

## 2014-08-27 NOTE — Assessment & Plan Note (Signed)
stable overall by history and exam, recent data reviewed with pt, and pt to continue medical treatment as before,  to f/u any worsening symptoms or concerns Lab Results  Component Value Date   WBC 7.1 08/26/2014   HGB 11.9* 08/26/2014   HCT 35.5* 08/26/2014   PLT 135.0* 08/26/2014   GLUCOSE 97 08/26/2014   CHOL 134 08/26/2014   TRIG 110.0 08/26/2014   HDL 35.90* 08/26/2014   LDLCALC 76 08/26/2014   ALT 13 08/26/2014   AST 18 08/26/2014   NA 138 08/26/2014   K 5.4* 08/26/2014   CL 113* 08/26/2014   CREATININE 2.1* 08/26/2014   BUN 46* 08/26/2014   CO2 18* 08/26/2014   TSH 3.14 08/26/2014   PSA 5.66* 10/19/2009   INR 1.0 08/09/2014   HGBA1C 5.3 10/05/2013

## 2014-09-02 DIAGNOSIS — R040 Epistaxis: Secondary | ICD-10-CM | POA: Diagnosis not present

## 2014-09-05 ENCOUNTER — Encounter (HOSPITAL_COMMUNITY): Payer: Self-pay | Admitting: *Deleted

## 2014-09-05 ENCOUNTER — Ambulatory Visit (INDEPENDENT_AMBULATORY_CARE_PROVIDER_SITE_OTHER): Payer: Medicare Other | Admitting: Cardiology

## 2014-09-05 ENCOUNTER — Encounter: Payer: Self-pay | Admitting: Cardiology

## 2014-09-05 VITALS — BP 178/66 | HR 59 | Ht 67.0 in | Wt 136.4 lb

## 2014-09-05 DIAGNOSIS — R0609 Other forms of dyspnea: Secondary | ICD-10-CM

## 2014-09-05 DIAGNOSIS — R040 Epistaxis: Secondary | ICD-10-CM

## 2014-09-05 DIAGNOSIS — R079 Chest pain, unspecified: Secondary | ICD-10-CM | POA: Diagnosis not present

## 2014-09-05 DIAGNOSIS — I200 Unstable angina: Secondary | ICD-10-CM

## 2014-09-05 DIAGNOSIS — Z955 Presence of coronary angioplasty implant and graft: Secondary | ICD-10-CM

## 2014-09-05 DIAGNOSIS — K5909 Other constipation: Secondary | ICD-10-CM

## 2014-09-05 DIAGNOSIS — R0989 Other specified symptoms and signs involving the circulatory and respiratory systems: Secondary | ICD-10-CM | POA: Diagnosis not present

## 2014-09-05 DIAGNOSIS — N183 Chronic kidney disease, stage 3 unspecified: Secondary | ICD-10-CM

## 2014-09-05 DIAGNOSIS — Z9861 Coronary angioplasty status: Secondary | ICD-10-CM | POA: Diagnosis not present

## 2014-09-05 DIAGNOSIS — I739 Peripheral vascular disease, unspecified: Secondary | ICD-10-CM

## 2014-09-05 DIAGNOSIS — I779 Disorder of arteries and arterioles, unspecified: Secondary | ICD-10-CM

## 2014-09-05 DIAGNOSIS — I10 Essential (primary) hypertension: Secondary | ICD-10-CM

## 2014-09-05 DIAGNOSIS — Z8711 Personal history of peptic ulcer disease: Secondary | ICD-10-CM

## 2014-09-05 DIAGNOSIS — I359 Nonrheumatic aortic valve disorder, unspecified: Secondary | ICD-10-CM

## 2014-09-05 DIAGNOSIS — J449 Chronic obstructive pulmonary disease, unspecified: Secondary | ICD-10-CM

## 2014-09-05 DIAGNOSIS — I2510 Atherosclerotic heart disease of native coronary artery without angina pectoris: Secondary | ICD-10-CM

## 2014-09-05 DIAGNOSIS — E785 Hyperlipidemia, unspecified: Secondary | ICD-10-CM

## 2014-09-05 DIAGNOSIS — J4489 Other specified chronic obstructive pulmonary disease: Secondary | ICD-10-CM

## 2014-09-05 DIAGNOSIS — I2119 ST elevation (STEMI) myocardial infarction involving other coronary artery of inferior wall: Secondary | ICD-10-CM | POA: Diagnosis not present

## 2014-09-05 DIAGNOSIS — I351 Nonrheumatic aortic (valve) insufficiency: Secondary | ICD-10-CM

## 2014-09-05 DIAGNOSIS — I350 Nonrheumatic aortic (valve) stenosis: Secondary | ICD-10-CM

## 2014-09-05 MED ORDER — LOSARTAN POTASSIUM 25 MG PO TABS: 25.0000 mg | ORAL_TABLET | Freq: Every day | ORAL | Status: DC

## 2014-09-05 MED ORDER — ISOSORBIDE MONONITRATE ER 30 MG PO TB24: 30.0000 mg | ORAL_TABLET | Freq: Every day | ORAL | Status: DC

## 2014-09-05 NOTE — Patient Instructions (Signed)
MAY TAKE METAMUCIL   STOP ASPIRIN  START LOSARTAN 25 MG ONE DAILY   IMDUR (isosorbide mn) 30 mg one daily  Your physician has requested that you have a lexiscan myoview. For further information please visit [email or web address]. Please follow instruction sheet, as given.    Your physician wants you to follow-up in 3 month DR HARDING. [place] will receive a reminder letter in the mail two months in advance. If you don't receive a letter, please call our office to schedule the follow-up appointment.

## 2014-09-06 ENCOUNTER — Encounter: Payer: Self-pay | Admitting: Cardiology

## 2014-09-06 NOTE — Assessment & Plan Note (Signed)
Definitely playing a role with his dyspnea. Deferred treatment to PCP

## 2014-09-06 NOTE — Progress Notes (Signed)
PCP: Cathlean Cower, MD  Clinic Note: Chief Complaint  Patient presents with  . [place] - ENT ( LANCED DR NEWMAN, CHEST PAIN - IN THE LAST MONTH 3 NTG--- SOB, NO EDEMA, LOSING WEIGHT    HPI: Nicholas Porter is a 80 y.o. male with a Cardiovascular Problem List below who presents today for urgent visit to evaluate recurrent epistaxis and chest pain/dyspnea on exertion. I last saw him in June for post [facility] performed for unstable angina - He was found to have 75% ISR in the RCA DES (placed for inferior stenting in the fall of 2014) -- treated with [place] PTCA. Saw him then he was doing. While with no significant symptoms. He is back exercising. He has baseline exertional dyspnea that was improved. He did have some minor dizziness.  1. CAD S/P percutaneous coronary angioplasty   2. H/O Inferior STEMI (09/2013) emergent PCI with Promus DES to RCA (3.0 mm  x 28 mm - 3.2 mm)   3. Unstable angina, relief with NTG --> Cath with PTCA of RCA stent ISR   4. Presence of drug coated stent in right coronary artery; 75% ISR treated with PTCA 05/2014   5. Chest pain with moderate risk for cardiac etiology   6. DOE (dyspnea on exertion)   7. Epistaxis, recurrent   8. Chronic obstructive pulmonary disease, unspecified COPD, unspecified chronic bronchitis type   9. Mild aortic stenosis   10. Moderate aortic insufficiency   11. Left-sided carotid artery disease -- s/p CEA   12. Essential hypertension   13. Hyperlipidemia with target LDL less than 70; On Atorvastatin   14. PEPTIC ULCER DISEASE, HX OF   15. CKD (chronic kidney disease), stage III   16. Other constipation    Interval History: He presents today with a main concern of multiple episodes of epistaxis that required treatment by the ENT physicians. There is some concern that he has been having some hemoptysis, but it is more from swallowing blood from his nose the coughing it up. He is not noting  significant coughing beyond his normal COPD related coughing.. Perhaps more concerning however is that he is now complaining of significant exertional dyspnea that is worse than it had been last visit. He also has noted chest tightness radiating to his throat with exertion. He has taken a couple so little nitroglycerin that provided some help. He has not noted any rapid or irregular heartbeats. No PND, orthopnea, or edema the  No syncope/near syncope. No TIA/amaurosis fugax symptoms. No melena, hematochezia, hematuria. No claudication.  MEDICATIONS AND ALLERGIES REVIEWED IN EPIC No Change in Social and Family History Prior Cardiac Evaluation and Past Surgical History: Procedure Laterality Date  . Carotid endarterectomy Left 2007    Dr.Hayes  . Amputation      left index finger -tramatic  . Cardiac catheterization  November 2011    40% mid LAD, 50% proximal RCA, 30-40% distal RCA (DOMINANT RCA with wraparound PDA & 2 large PLBs), 60-70% ostial OM1. No change from 2001. Medical therapy  . Coronary angioplasty with stent placement  09/2013; 05/2014    Promus DES 3.0 mm x 28 mm (3.2 mm)  to RCA with STEMI; residual 70% mid-distal LAD, OM 270-80%. Medical management; b) PTCA of 75 % ISR , LAD lesion noted as ~40%  . Nm myoview ltd  11/2013    EF 59%, Fixed Inferior-Inferoapical Defect c/w Inferior Scar; No Ischemia (this was to  evaluate the LAD Lesion0   ROS: A comprehensive Review of Systems - was performed Review of Systems  Constitutional: Positive for weight loss.  HENT: Positive for congestion, hearing loss and nosebleeds.   Respiratory: Positive for cough, hemoptysis and shortness of breath. Negative for wheezing.        Mild chronic cough; does not appear to be true hemoptysis  Cardiovascular: Positive for chest pain. Negative for palpitations, orthopnea, claudication, leg swelling and PND.  Gastrointestinal: Positive for abdominal pain and constipation. Negative for blood in stool and  melena.       Hard, firm abdomen. He previously had diarrhea  Genitourinary: Negative for dysuria and hematuria.  Musculoskeletal: Negative for myalgias.  Skin: Positive for itching.       Right arm  Neurological: Negative for dizziness, sensory change, speech change, focal weakness, seizures and loss of consciousness.  Endo/Heme/Allergies: Bruises/bleeds easily.  Psychiatric/Behavioral: Negative for depression. The patient is not nervous/anxious.   All other systems reviewed and are negative.   Wt Readings from Last 3 Encounters:  09/05/14 136 lb 6.4 oz (61.871 kg)  08/26/14 135 lb 2 oz (61.292 kg)  08/09/14 134 lb 6 oz (60.952 kg)  11/2013 140 lb 12.8 oz (63.86 kg)  PHYSICAL EXAM BP 178/66  Pulse 59  Ht 5\' 7"  (1.702 m)  Wt 136 lb 6.4 oz (61.871 kg)  BMI 21.36 kg/m2 General: alert, cooperative, appears stated age, no distress and Well-groomed. Well-nourished. Normal mood and affect. Answers questions appropriately, but tends to ramble &get off track.  He does appear to be thinner than prior visits (notes 2 belt holes difference). HEENT: [place]/AT, EOMI, MMM, anicteric sclera; arcus senilis -- several tissues with streaks of bloody mucous is noted in the trash can (some from his nose & some that he "spit-up" -- not cough) Neck: no adenopathy, no JVD, supple, symmetrical; No LAN.  Soft left-sided carotid bruit.  Lungs: Mild interstitial rhonchi, but no Wheeze or Rales, normal percussion bilaterally and Nonlabored, good air movement  Heart: RRR, normal S1 and S2. Soft 1-2/6 early peaking c-d SEM at RUSB as well as ~1/6 DM in same location  Abdomen: Soft with mild right upper quadrant and epigastric tenderness. NABS. No rebound  Extremities: extremities normal, atraumatic, no cyanosis or edema  Pulses: 2+ and symmetric  Skin: Skin color, texture, turgor normal. No rashes or lesions or Just mild bruising from being on antiplatelet agents.  Neurologic: Grossly normal    Adult ECG Report   Rate: 61 ;  Rhythm: sinus bradycardia and Borderline LVH.  Voltages: consistent with LVH; Other Abnormalities: Septal Qs - CRO septal infarct, age undetermined   Narrative Interpretation: stable   Recent Labs 08/26/2014:   Cr 2.1 - stable (GFR 40) CKD III  CBC with Hgb 11.9, stable  TCC134, TG 110, HDL 36, LDL 76. - Relatively stable compared to October 2014  ASSESSMENT / PLAN: Chest pain with moderate risk for cardiac etiology - associated with exertional dyspnea Out of concern that he has recurrent chest discomfort and dyspnea that seems to be worsening just 3 months after PTCA of in-stent restenosis. Of course this is in the setting of him having significant epistaxis with questionable hemoptysis. He clearly is symptomatic and is relatively close to having had a procedure. This needs to be evaluated, but with only being exertional, I think we're safe starting a noninvasive evaluation.  Plan: Lexiscan Myoview  Add Imdur 30 mg daily  Low threshold for relook cardiac catheterization  Dyspnea on  exertion Of course this could be multifactorial, but it is worse is concerning for possible ischemia. It could also be related to diastolic dysfunction with his profound hypertension today.  Plan: LexiScan Myoview, add Imdur and Cozaar  Epistaxis, recurrent Status post treatment by ENT. He still has some mild epistaxis now. I will stop aspirin, but continued Plavix. He needs to keep his nasal passages moist and follow instructions per ENT  CAD S/P percutaneous coronary angioplasty - DES in RCA with PTCA for ISR; Moderate mLAD, 80% ostial OM2 stable Less than one year out from inferior STEMI status post PCI to the RCA with DES but has now had in-stent restenosis status post PTCA. He had 3 significant groin hematoma issue with foot numbness following femoral access in June.  He is relatively close to his recent PTCA but I think we're safe stopping aspirin and continuing Plavix to avoid further  bleeding. His hemoglobin level has been stable. He is on beta blocker and I'm going to add ARB for additional blood pressure control. He is on a statin but has excellent lipid control.  COPD (chronic obstructive pulmonary disease) Definitely playing a role with his dyspnea. Deferred treatment to PCP  Moderate aortic insufficiency The plan was to check an echocardiogram shortly after this visit. Depending on the results of the Myoview we may go ahead and get that ordered to exclude worsening aortic regurgitation as a contributor to his dyspnea.  Essential hypertension Very much out of control today. I'm not sure what is different today versus before. I am concerned however with him having nosebleeds that hypertension could be related.  Plan: Add Cozaar 25 mg daily  Constipation Recommended the use of fiber such as Metamucil or prune juice in addition to potentially Colace/senna. I also told him to monitor for changes in his stool that would be concerning for possible colon cancer findings images pencil thin stool or change in color. Strangely enough he had a history of intractable diarrhea not that long ago. His abdomen is quite tender.   Orders Placed This Encounter  Procedures  . Myocardial Perfusion Imaging    DOE, CHEST PAIN    Standing Status: Future     Number of Occurrences:      Standing Expiration Date: 09/05/2015    Order Specific Question:  Where should this test be performed    Answer:  [facility]    Order Specific Question:  Type of stress    Answer:  Lexiscan    Order Specific Question:  Patient weight in lbs    Answer:  136  . EKG 12-Lead   Meds ordered this encounter  Medications  . losartan (COZAAR) 25 MG tablet    Sig: Take 1 tablet (25 mg total) by mouth daily.    Dispense:  30 tablet    Refill:  11  . isosorbide mononitrate (IMDUR) 30 MG 24 hr tablet    Sig: Take 1 tablet (30 mg total) by mouth daily.    Dispense:  30 tablet    Refill:  11   The  patient is very complex medically he has multiple active cardiac and noncardiac issues.  Her sclerae views history and we discussed at least 5 active issues. I spent over 40 minutes in the patient's room, and at least 15-20 minutes with documentation.  Followup: 3 months or sooner if stress test abnormal  DAVID W. Ellyn Hack, M.D., M.S. Interventional Cardiologist [facility]

## 2014-09-06 NOTE — Assessment & Plan Note (Addendum)
Status post treatment by ENT. He still has some mild epistaxis now. I will stop aspirin, but continued Plavix. He needs to keep his nasal passages moist and follow instructions per ENT

## 2014-09-06 NOTE — Assessment & Plan Note (Signed)
Very much out of control today. I'm not sure what is different today versus before. I am concerned however with him having nosebleeds that hypertension could be related.  Plan: Add Cozaar 25 mg daily

## 2014-09-06 NOTE — Assessment & Plan Note (Signed)
The plan was to check an echocardiogram shortly after this visit. Depending on the results of the Myoview we may go ahead and get that ordered to exclude worsening aortic regurgitation as a contributor to his dyspnea.

## 2014-09-06 NOTE — Assessment & Plan Note (Signed)
Recommended the use of fiber such as Metamucil or prune juice in addition to potentially Colace/senna. I also told him to monitor for changes in his stool that would be concerning for possible colon cancer findings images pencil thin stool or change in color. Strangely enough he had a history of intractable diarrhea not that long ago. His abdomen is quite tender.

## 2014-09-06 NOTE — Assessment & Plan Note (Addendum)
Less than one year out from inferior STEMI status post PCI to the RCA with DES but has now had in-stent restenosis status post PTCA. He had 3 significant groin hematoma issue with foot numbness following femoral access in June.  He is relatively close to his recent PTCA but I think we're safe stopping aspirin and continuing Plavix to avoid further bleeding. His hemoglobin level has been stable. He is on beta blocker and I'm going to add ARB for additional blood pressure control. He is on a statin but has excellent lipid control.

## 2014-09-06 NOTE — Assessment & Plan Note (Signed)
Of course this could be multifactorial, but it is worse is concerning for possible ischemia. It could also be related to diastolic dysfunction with his profound hypertension today.  Plan: LexiScan Myoview, add Imdur and Cozaar

## 2014-09-06 NOTE — Assessment & Plan Note (Signed)
Out of concern that he has recurrent chest discomfort and dyspnea that seems to be worsening just 3 months after PTCA of in-stent restenosis. Of course this is in the setting of him having significant epistaxis with questionable hemoptysis. He clearly is symptomatic and is relatively close to having had a procedure. This needs to be evaluated, but with only being exertional, I think we're safe starting a noninvasive evaluation.  Plan: Lexiscan Myoview  Add Imdur 30 mg daily  Low threshold for relook cardiac catheterization

## 2014-09-08 ENCOUNTER — Telehealth (HOSPITAL_COMMUNITY): Payer: Self-pay

## 2014-09-08 NOTE — Telephone Encounter (Signed)
Encounter complete. 

## 2014-09-09 ENCOUNTER — Ambulatory Visit (INDEPENDENT_AMBULATORY_CARE_PROVIDER_SITE_OTHER): Payer: Medicare Other

## 2014-09-09 DIAGNOSIS — Z23 Encounter for immunization: Secondary | ICD-10-CM

## 2014-09-13 ENCOUNTER — Ambulatory Visit (HOSPITAL_COMMUNITY)
Admission: RE | Admit: 2014-09-13 | Discharge: 2014-09-13 | Disposition: A | Discharge status: Home / Self Care | Payer: Medicare Other | Source: Ambulatory Visit | Attending: Cardiovascular Disease | Admitting: Cardiovascular Disease

## 2014-09-13 DIAGNOSIS — Z8249 Family history of ischemic heart disease and other diseases of the circulatory system: Secondary | ICD-10-CM | POA: Diagnosis not present

## 2014-09-13 DIAGNOSIS — Z9861 Coronary angioplasty status: Secondary | ICD-10-CM | POA: Diagnosis not present

## 2014-09-13 DIAGNOSIS — I2510 Atherosclerotic heart disease of native coronary artery without angina pectoris: Secondary | ICD-10-CM | POA: Diagnosis not present

## 2014-09-13 DIAGNOSIS — Z87891 Personal history of nicotine dependence: Secondary | ICD-10-CM | POA: Diagnosis not present

## 2014-09-13 DIAGNOSIS — I10 Essential (primary) hypertension: Secondary | ICD-10-CM | POA: Insufficient documentation

## 2014-09-13 DIAGNOSIS — I739 Peripheral vascular disease, unspecified: Secondary | ICD-10-CM | POA: Insufficient documentation

## 2014-09-13 DIAGNOSIS — R079 Chest pain, unspecified: Secondary | ICD-10-CM | POA: Insufficient documentation

## 2014-09-13 DIAGNOSIS — I779 Disorder of arteries and arterioles, unspecified: Secondary | ICD-10-CM | POA: Diagnosis not present

## 2014-09-13 DIAGNOSIS — R0989 Other specified symptoms and signs involving the circulatory and respiratory systems: Secondary | ICD-10-CM | POA: Insufficient documentation

## 2014-09-13 DIAGNOSIS — E785 Hyperlipidemia, unspecified: Secondary | ICD-10-CM | POA: Insufficient documentation

## 2014-09-13 DIAGNOSIS — I252 Old myocardial infarction: Secondary | ICD-10-CM | POA: Diagnosis not present

## 2014-09-13 DIAGNOSIS — R0609 Other forms of dyspnea: Secondary | ICD-10-CM | POA: Insufficient documentation

## 2014-09-13 MED ORDER — REGADENOSON 0.4 MG/5ML IV SOLN
0.4000 mg | Freq: Once | INTRAVENOUS | Status: AC
  Administered 2014-09-13: 0.4 mg via INTRAVENOUS

## 2014-09-13 MED ORDER — TECHNETIUM TC 99M SESTAMIBI GENERIC - CARDIOLITE
30.9000 | Freq: Once | INTRAVENOUS | Status: AC | PRN
  Administered 2014-09-13: 30.9 via INTRAVENOUS

## 2014-09-13 MED ORDER — TECHNETIUM TC 99M SESTAMIBI GENERIC - CARDIOLITE
10.6000 | Freq: Once | INTRAVENOUS | Status: AC | PRN
  Administered 2014-09-13: 11 via INTRAVENOUS

## 2014-09-13 MED ORDER — AMINOPHYLLINE 25 MG/ML IV SOLN
75.0000 mg | Freq: Once | INTRAVENOUS | Status: AC
  Administered 2014-09-13: 75 mg via INTRAVENOUS

## 2014-09-13 NOTE — Procedures (Addendum)
[facility] [address] [place] [address] [id number]  Cardiology Nuclear Med Study  Nicholas Porter is a 80 y.o. male     MRN : [id number]     DOB: 1930/08/23  Procedure Date: 09/13/2014  Nuclear Med Background Indication for Stress Test:  Stent Patency History:  CAD, MI 10/14, Stent placement 10/14 Cardiac Risk Factors: Carotid Disease, Family History - CAD, History of Smoking, Hypertension, Lipids and PVD  Symptoms:  Chest Pain and DOE   Nuclear Pre-Procedure Caffeine/Decaff Intake:  7:00pm NPO After: 5:00am   IV Site: R Antecubital  IV 0.9% NS with Angio Cath:  22g  Chest Size (in):  42 IV Started by: Otho Perl, CNMT  Height: 5\' 7"  (1.702 m)  Cup Size: n/a  BMI:  Body mass index is 21.3 kg/(m^2). Weight:  136 lb (61.689 kg)   Tech Comments:       Nuclear Med Study 1 or 2 day study: 1 day  Stress Test Type:  [place] Provider:  Glenetta Hew, MD   Resting Radionuclide: Technetium 79m Sestamibi  Resting Radionuclide Dose: 10.6 mCi   Stress Radionuclide:  Technetium 18m Sestamibi  Stress Radionuclide Dose: 30.9 mCi           Stress Protocol Rest HR: 49 Stress HR:72  Rest BP:161/86 Stress BP:162/82  Exercise Time (min): n/a METS: n/a          Dose of Adenosine (mg):  n/a Dose of Lexiscan: 0.4 mg  Dose of Atropine (mg): n/a Dose of Dobutamine: n/a mcg/kg/min (at max HR)  Stress Test Technologist: Mellody Memos, CCT Nuclear Technologist: Imagene Riches, CNMT   Rest Procedure:  Myocardial perfusion imaging was performed at rest 45 minutes following the intravenous administration of Technetium 81m Sestamibi. Stress Procedure:  The patient received IV Lexiscan 0.4 mg over 15-seconds.  Technetium 73m Sestamibi injected Iv at 30-seconds.  Patient experienced shortness of breath, dizziness and was administered 75 mg of Aminophylline IV at 5 minutes. There were no significant changes with  Lexiscan.  Quantitative spect images were obtained after a 45 minute delay.  Transient Ischemic Dilatation (Normal <1.22):  1.18  QGS EDV:  95 ml QGS ESV:  43 ml LV Ejection Fraction: 55%        Rest ECG: NSR-LVH  Stress ECG: No significant change from baseline ECG  QPS Raw Data Images:  Normal; no motion artifact; normal heart/lung ratio. Stress Images:  Normal homogeneous uptake in all areas of the myocardium. Rest Images:  There is decreased uptake in the inferior wall. Subtraction (SDS):  There is a fixed inferior defect that is most consistent with diaphragmatic attenuation.  Impression Exercise Capacity:  Lexiscan with no exercise. BP Response:  Normal blood pressure response. Clinical Symptoms:  No significant symptoms noted. ECG Impression:  No significant ST segment change suggestive of ischemia. Comparison with Prior Nuclear Study: No significant change from previous study  Overall Impression:  Low risk stress nuclear study Diaphragmatic attenuation artifact.  LV Wall Motion:  NL LV Function; NL Wall Motion   Lorretta Harp, MD  09/13/2014 12:24 PM

## 2014-09-13 NOTE — Progress Notes (Signed)
Quick Note:  Stress Test looked good!! No sign of significant obstructive Heart Artery Disease. Pump function is normal.  Good news!!.  Leonie Man, MD  ______

## 2014-09-14 ENCOUNTER — Telehealth: Payer: Self-pay | Admitting: *Deleted

## 2014-09-14 NOTE — Telephone Encounter (Signed)
Message copied by Raiford Simmonds on Wed Sep 14, 2014  3:05 PM ------      Message from: Leonie Man      Created: Tue Sep 13, 2014  7:01 PM       Stress Test looked good!! No sign of significant obstructive Heart Artery Disease.  Pump function is normal.            Good news!!.            Leonie Man, MD       ------

## 2014-09-14 NOTE — Telephone Encounter (Signed)
Left message to call back. In regards, to test results--echo

## 2014-09-15 NOTE — Telephone Encounter (Signed)
Spoke to patient. Result given . Verbalized understanding  

## 2014-09-15 NOTE — Telephone Encounter (Signed)
LEFT MESSAGE TO CALL BACK

## 2014-10-26 DIAGNOSIS — H4011X2 Primary open-angle glaucoma, moderate stage: Secondary | ICD-10-CM | POA: Diagnosis not present

## 2014-12-01 ENCOUNTER — Encounter (HOSPITAL_COMMUNITY): Payer: Self-pay | Admitting: Cardiology

## 2014-12-13 ENCOUNTER — Encounter: Payer: Self-pay | Admitting: Cardiology

## 2014-12-13 ENCOUNTER — Ambulatory Visit (INDEPENDENT_AMBULATORY_CARE_PROVIDER_SITE_OTHER): Payer: Medicare Other | Admitting: Cardiology

## 2014-12-13 ENCOUNTER — Telehealth: Payer: Self-pay | Admitting: Cardiology

## 2014-12-13 VITALS — BP 168/63 | Ht 67.0 in | Wt 136.2 lb

## 2014-12-13 DIAGNOSIS — R040 Epistaxis: Secondary | ICD-10-CM

## 2014-12-13 DIAGNOSIS — R0609 Other forms of dyspnea: Secondary | ICD-10-CM | POA: Diagnosis not present

## 2014-12-13 DIAGNOSIS — I351 Nonrheumatic aortic (valve) insufficiency: Secondary | ICD-10-CM

## 2014-12-13 DIAGNOSIS — I200 Unstable angina: Secondary | ICD-10-CM

## 2014-12-13 DIAGNOSIS — E785 Hyperlipidemia, unspecified: Secondary | ICD-10-CM

## 2014-12-13 DIAGNOSIS — I10 Essential (primary) hypertension: Secondary | ICD-10-CM | POA: Diagnosis not present

## 2014-12-13 DIAGNOSIS — I2510 Atherosclerotic heart disease of native coronary artery without angina pectoris: Secondary | ICD-10-CM | POA: Diagnosis not present

## 2014-12-13 DIAGNOSIS — R079 Chest pain, unspecified: Secondary | ICD-10-CM | POA: Diagnosis not present

## 2014-12-13 DIAGNOSIS — I350 Nonrheumatic aortic (valve) stenosis: Secondary | ICD-10-CM

## 2014-12-13 DIAGNOSIS — Z9861 Coronary angioplasty status: Secondary | ICD-10-CM

## 2014-12-13 MED ORDER — LOSARTAN POTASSIUM 50 MG PO TABS: 50.0000 mg | ORAL_TABLET | Freq: Every day | ORAL | Status: DC

## 2014-12-13 NOTE — Telephone Encounter (Signed)
Pau was seen today by Dr. Ellyn Hack and needs clarification on his medication directions.  Please call patient and Mrs.Wileman with this information.  Mrs. Zuhlke will be at work after 1:00pm today and the number is [phone].Marland Kitchen

## 2014-12-13 NOTE — Patient Instructions (Signed)
INCREASE LOSARTAN  TO 50 MG FROM 25 MG. YOU MAKE TAKE 2 TABLET OF 25 MG  DALIY UNTIL COMPLETE THE BOTTLE ,THEN START WITH 50 MG ONE TABLET DAILY.  NO OTHER CHANGES WITH MEDICATIONS   Your physician wants you to follow-up in [place].  You will receive a reminder letter in the mail two months in advance. If you don't receive a letter, please call our office to schedule the follow-up appointment.

## 2014-12-13 NOTE — Telephone Encounter (Signed)
Spoke with pt, went over medications and the change made today. Patient voiced understanding

## 2014-12-14 ENCOUNTER — Encounter: Payer: Self-pay | Admitting: Cardiology

## 2014-12-14 NOTE — Assessment & Plan Note (Signed)
Recheck echo next year to reassess aortic valve and pulmonary hypertension.

## 2014-12-14 NOTE — Assessment & Plan Note (Signed)
Status post PTCA of in-stent restenosis remains on clopidogrel, beta blocker along with now ARB. Also on Imdur. Was on hydralazine, now no longer on it as he was started on ARB

## 2014-12-14 NOTE — Assessment & Plan Note (Addendum)
Likely a diastolic dysfunction (and pulmonary hypertension be primary or secondary) and microvascular ischemia component -->  will titrate losartan to 50 mg daily. Also consider calcium channel blocker No signs of true heart failure therefore I will not place on a diuretic.

## 2014-12-14 NOTE — Progress Notes (Signed)
PCP: Cathlean Cower, MD  Clinic Note: Chief Complaint  Patient presents with  . other    Pt. experiencing SOB    HPI: Nicholas Porter is a 80 y.o. male with a long past medical history that was reviewed. His cardiovascular history includes CAD dating back to 2001. More recently he suffered an inferior STEMI in October 2014 with an RCA lesion treated with a drug-eluting stent. He did well until this June when he had unstable angina and underwent [place] PTCA of 75% in-stent restenosis in the RCA.  I last saw him in September for epistaxis episodes as well as shortness of breath. He also noted significant chest discomfort with nitroglycerin relief he was describing chest tightness radiating to his throat worse with exertion.. He had a Myoview stress test that was performed in September that was negative for ischemia but a fixed inferior defect consistent with either diaphragmatic attenuation or inferior infarct. I started him on Imdur and Cozaar  1. Chest pain with moderate risk for cardiac etiology - associated with exertional dyspnea   2. Unstable angina, relief with NTG --> Cath with PTCA of RCA stent ISR   3. CAD S/P percutaneous coronary angioplasty - DES in RCA with PTCA for ISR; Moderate mLAD, 80% ostial OM2 stable   4. Dyspnea on exertion   5. Mild aortic stenosis   6. Epistaxis, recurrent   7. Moderate aortic insufficiency   8. Essential hypertension   9. Hyperlipidemia with target LDL less than 70; no longer On Atorvastatin      Interval History: Interestingly, since the last visit he has not really had that much the way of any real symptoms to speak of. We have started him on Isordil and Cozaar and that has essentially taken away his anginal symptom. He does get exertional dyspnea if he "overdoes it. But not with normal rugae activity. If he will climb a hill or stairs he'll get short of breath the past, but his baseline now. No further anginal symptoms. He denies any PND,  orthopnea or edema. A lot of his physical activity is limited by back and hip arthritis pains. Despite this he remains active and pushing himself to the level that he is capable of doing.  No syncope/near syncope, or TIA/amaurosis fugax symptoms. No melena, hematochezia, hematuria. No claudication. Current Outpatient Prescriptions on File Prior to Visit  Medication Sig Dispense Refill  . brinzolamide (AZOPT) 1 % ophthalmic suspension Place 1 drop into both eyes every 12 (twelve) hours.     . clopidogrel (PLAVIX) 75 MG tablet Take 1 tablet (75 mg total) by mouth daily. 30 tablet 11  . isosorbide mononitrate (IMDUR) 30 MG 24 hr tablet Take 1 tablet (30 mg total) by mouth daily. 30 tablet 11  . metoprolol succinate (TOPROL-XL) 50 MG 24 hr tablet Take 1 tablet (50 mg total) by mouth daily. Take with or immediately following a meal. 90 tablet 3  . naphazoline-glycerin (CLEAR EYES) 0.012-0.2 % SOLN Place 1-2 drops into the left eye every 4 (four) hours as needed for irritation.    . nitroGLYCERIN (NITROSTAT) 0.4 MG SL tablet Place 1 tablet (0.4 mg total) under the tongue every 5 (five) minutes as needed. For chest pain. 25 tablet 6  . TRAVATAN Z 0.004 % SOLN ophthalmic solution Place 1 drop into both eyes at bedtime.      No current facility-administered medications on file prior to visit.   Allergies  Allergen Reactions  . Amlodipine Besylate Hives  .  Hydralazine Itching  . Lipitor [Atorvastatin] Itching  . Naproxen Diarrhea  . Oxycodone-Acetaminophen Other (See Comments)    REACTION: unspecified   No Change in Social and Family History  Prior Cardiac Evaluation and Past Surgical History: Procedure Laterality Date  . Carotid endarterectomy Left 2007    Dr.Hayes  . Amputation      left index finger -tramatic  . Cardiac catheterization  November 2011    40% mid LAD, 50% proximal RCA, 30-40% distal RCA (DOMINANT RCA with wraparound PDA & 2 large PLBs), 60-70% ostial OM1. No change from  2001. Medical therapy  . Coronary angioplasty with stent placement  09/2013; 05/2014    10/'14: Promus DES 3.0 mm x 28 mm (3.2 mm)  to RCA with STEMI; residual 70% mid-distal LAD, OM2 70-80%. Medical management; b) 6/'15 PTCA of 75 % ISR , LAD lesion noted as ~40%  . Nm myoview ltd  11/2013/ 09/11/2014     EF 59%, Fixed Inferior-Inferoapical Defect c/w Inferior Scar; No Ischemia (this was to evaluate the LAD Lesion;; 9/'15: EF 55% - persistent fixed inferior-inferoapical perfusion defect  - read as diaphragmatic attenuation, likely consistent with infarct/scar    ROS: A comprehensive Review of Systems - was performed Review of Systems  Constitutional: Positive for weight loss.  HENT: Positive for congestion, hearing loss and nosebleeds.   Respiratory: Positive for cough, hemoptysis and shortness of breath. Negative for wheezing.        Mild chronic cough; does not appear to be true hemoptysis  Cardiovascular: Positive for chest pain. Negative for palpitations, orthopnea, claudication, leg swelling and PND.  Gastrointestinal: Positive for abdominal pain and constipation. Negative for blood in stool and melena.       Hard, firm abdomen. He previously had diarrhea  Genitourinary: Negative for dysuria and hematuria.  Musculoskeletal: Negative for myalgias.  Skin: Positive for itching.       Right arm  Neurological: Negative for dizziness, sensory change, speech change, focal weakness, seizures and loss of consciousness.  Endo/Heme/Allergies: Bruises/bleeds easily.  Psychiatric/Behavioral: Negative for depression. The patient is not nervous/anxious.   All other systems reviewed and are negative.   Wt Readings from Last 3 Encounters:  12/13/14 136 lb 3.2 oz (61.78 kg)  09/13/14 136 lb (61.689 kg)  09/05/14 136 lb 6.4 oz (61.871 kg)  11/2013 140 lb 12.8 oz (63.86 kg)  PHYSICAL EXAM BP 168/63 mmHg  Ht 5\' 7"  (1.702 m)  Wt 136 lb 3.2 oz (61.78 kg)  BMI 21.33 kg/m2 General: alert,  cooperative, appears stated age, no distress and Well-groomed. Well-nourished. Normal mood and affect. Answers questions appropriately, but tends to ramble &get off track.  HEENT: [place]/AT, EOMI, MMM, anicteric sclera; arcus senilis -- several tissues with streaks of bloody mucous is noted in the trash can (some from his nose & some that he "spit-up" -- not cough) Neck: no adenopathy, no JVD, supple, symmetrical; No LAN.  Soft left-sided carotid bruit.  Lungs: Less noticeable interstitial rhonchi, but no Wheeze or Rales, normal percussion bilaterally and Nonlabored, good air movement  Heart: RRR, normal S1 and S2. Soft 1-2/6 early peaking c-d SEM at RUSB as well as ~1/6 DM in same location  Abdomen: Soft with mild right upper quadrant and epigastric tenderness. NABS. No rebound  Extremities: extremities normal, atraumatic, no cyanosis or edema  Pulses: 2+ and symmetric  Skin: Skin color, texture, turgor normal. No rashes or lesions or Just mild bruising from being on antiplatelet agents.  Neurologic: Grossly normal  Adult ECG Report not performed  Recent Labs 08/26/2014:   Cr 2.1 - stable (GFR 40) CKD III  CBC with Hgb 11.9, stable   TC 134, TG 110, HDL 36, LDL 76. - Relatively stable compared to October 2014  ASSESSMENT / PLAN: Chest pain with moderate risk for cardiac etiology - associated with exertional dyspnea Still has exertional dyspnea, but no ischemia on nuclear stress test. This confirms that the LAD is not likely to have a significant lesion. There does appear to be a persistent inferior infarct although it was read as a diaphragmatic attenuation. Overall doing much better on Imdur and Cozaar.  Unstable angina, relief with NTG --> Cath with PTCA of RCA stent ISR Status post PTCA of in-stent restenosis remains on clopidogrel, beta blocker along with now ARB. Also on Imdur. Was on hydralazine, now no longer on it as he was started on ARB  CAD S/P percutaneous coronary angioplasty -  DES in RCA with PTCA for ISR; Moderate mLAD, 80% ostial OM2 stable Negative Myoview. Likely has microvascular disease with some relief using Imdur. We have room to titrate up the Imdur and potentially even add a calcium channel blocker if necessary.  Dyspnea on exertion Likely a diastolic dysfunction (and pulmonary hypertension be primary or secondary) and microvascular ischemia component -->  will titrate losartan to 50 mg daily. Also consider calcium channel blocker No signs of true heart failure therefore I will not place on a diuretic.  Mild aortic stenosis Recheck echo next year to reassess aortic valve and pulmonary hypertension.  Epistaxis, recurrent Stable now that he is off aspirin  Moderate aortic insufficiency Follow-up echo after next visit  Essential hypertension Remains elevated. Will increase Cozaar to 50 mg.  Hyperlipidemia with target LDL less than 70; no longer On Atorvastatin No longer on statin. Monitored by PCP. May need to consider switching to Crestor versus simply using Zetia. By last check in September his LDL was close to goal.   No orders of the defined types were placed in this encounter.   Meds ordered this encounter  Medications  . losartan (COZAAR) 50 MG tablet    Sig: Take 1 tablet (50 mg total) by mouth daily.    Dispense:  30 tablet    Refill:  11    Followup: Six-month  DAVID W. Ellyn Hack, M.D., M.S. Interventional Cardiologist [facility]

## 2014-12-14 NOTE — Assessment & Plan Note (Signed)
Follow-up echo after next visit

## 2014-12-14 NOTE — Assessment & Plan Note (Addendum)
Negative Myoview. Likely has microvascular disease with some relief using Imdur. We have room to titrate up the Imdur and potentially even add a calcium channel blocker if necessary.

## 2014-12-14 NOTE — Assessment & Plan Note (Signed)
Remains elevated. Will increase Cozaar to 50 mg.

## 2014-12-14 NOTE — Assessment & Plan Note (Signed)
Stable now that he is off aspirin

## 2014-12-14 NOTE — Assessment & Plan Note (Signed)
Still has exertional dyspnea, but no ischemia on nuclear stress test. This confirms that the LAD is not likely to have a significant lesion. There does appear to be a persistent inferior infarct although it was read as a diaphragmatic attenuation. Overall doing much better on Imdur and Cozaar.

## 2014-12-14 NOTE — Assessment & Plan Note (Signed)
No longer on statin. Monitored by PCP. May need to consider switching to Crestor versus simply using Zetia. By last check in September his LDL was close to goal.

## 2014-12-18 ENCOUNTER — Other Ambulatory Visit: Payer: Self-pay | Admitting: Internal Medicine

## 2015-01-23 ENCOUNTER — Ambulatory Visit: Payer: Medicare Other | Admitting: Nurse Practitioner

## 2015-01-24 ENCOUNTER — Encounter: Payer: Self-pay | Admitting: Internal Medicine

## 2015-01-24 ENCOUNTER — Ambulatory Visit (INDEPENDENT_AMBULATORY_CARE_PROVIDER_SITE_OTHER): Payer: Medicare Other | Admitting: Internal Medicine

## 2015-01-24 VITALS — BP 140/76 | HR 55 | Temp 98.2°F | Ht 67.0 in | Wt 135.8 lb

## 2015-01-24 DIAGNOSIS — K219 Gastro-esophageal reflux disease without esophagitis: Secondary | ICD-10-CM | POA: Diagnosis not present

## 2015-01-24 DIAGNOSIS — R040 Epistaxis: Secondary | ICD-10-CM

## 2015-01-24 DIAGNOSIS — R42 Dizziness and giddiness: Secondary | ICD-10-CM

## 2015-01-24 DIAGNOSIS — M79645 Pain in left finger(s): Secondary | ICD-10-CM

## 2015-01-24 MED ORDER — PANTOPRAZOLE SODIUM 40 MG PO TBEC: 40.0000 mg | DELAYED_RELEASE_TABLET | Freq: Every day | ORAL | Status: DC

## 2015-01-24 NOTE — Assessment & Plan Note (Signed)
C/w mild truamatic arthritis, ok for no film today, tylenol prn,  to f/u any worsening symptoms or concerns

## 2015-01-24 NOTE — Progress Notes (Signed)
Subjective:    Patient ID: Nicholas Porter, male    DOB: July 02, 1930, 81 y.o.   MRN: [id number]  HPI  Here with acute visit -   C/o dizziness with lightheaded early in the AM for several months especially first thing in AM, has to sit on side of bed for it to pass, then able to get up, still feels some off balance on occas during the day.  Pt denies increased sob or doe, wheezing, orthopnea, PND, increased LE swelling, palpitations, dizziness or syncope.  Also had several minutes last wk, SSCP, dull, without radiation or assoc symptoms, but NTG x 1 time last wk seemed to help, but only occurs at bedtime with lying down after eating after 8 pm. Has occas reflux but Denies abd pain, dysphagia, n/v, bowel change or blood. Stress test neg sept 2015 for ischemia.    Also with left 4th finger PIP with acute swelling/pain/tender after shoveling ice last wk, some improved today, mild pain only, though grip slightly decresaed due to pain Past Medical History  Diagnosis Date  . CAD in native artery 2001; 2011; 2014; 05/2014    a) 2001 & 2011 -- Med Rx for: 60-70% ostial Cx-OM lesion, 40-50% lesions in the LAD and RC; b) Persantine Myoview 2012: No ischemia or infarction, EF 66% c) 10/'14: RCA 100% - PCI,CxOM stable, LAD ~70% mid; d) UA 6/'15: 75% ISR in RCA - PTCA, LAD lesion ~40%, CxOM stable.    . ST elevation myocardial infarction (STEMI) of inferior wall, emergent PCI with Promus DES to RCA 10/05/2013    100% mRCA occlusion; residual 80% OM2, ~70% LAD --> PCI to RCA  . CAD S/P percutaneous coronary angioplasty 10/05/2013; 05/2014    a) 10/'14: PCI to RCA - Promus Premier DES 3.0 mm x 28 mm to mRCA (~3.19 mm);; b) 6/'15: Cutting Balloon PTCA of 75 % RCA ISR  . Moderate aortic insufficiency February 2012    Echocardiogram: EF > 55%; impaired relaxation. Mild/moderate MAC with mild MR; Moderate Aortic Regurgitation and mild sclerosis/ NO stenosis; mild - mod Pulm HTN (40-53mmHg);; Follow-up 05/2013: EF  65-70%, PAP ~31 mmH, otherwise no change  . Mild aortic stenosis  10/05/2013    Echo post STEMI: EF 55-60%. No regional W. MA. Mild aortic stenosis with mild regurgitation. MAC. Moderate pulmonary hypertension, 58 mmHg.  . Carotid artery stenosis  December 2012    s/p L CEA  . Hypertension, benign   . Hyperlipidemia LDL goal <70   . CKD (chronic kidney disease), stage III   . COPD (chronic obstructive pulmonary disease)  12/07/2011  . Thrombocytopenia, acquired 06/30/2007  . Anemia, B12 deficiency 11/04/2008  . Personal history of peptic ulcer disease 06/30/2007  . GERD (gastroesophageal reflux disease) 06/30/2007  . Irritable bowel syndrome (IBS)  12/08/2008  . Gallstones  12/07/2011  . Hernia, hiatal  08/25/2008  . 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 History of colonic polyps 08/25/2008  . Diverticulosis of colon 12/08/2008  . Osteoarthritis 05/22/2008; 06/30/2007     with chronic upper and lumbar back pain as well as neck pain.  . Chronic back pain 12/06/2011  . Neck pain, chronic 12/06/2011  . Osteopenia 10/18/2008  . Hypothyroidism 10/18/2008  . Finger amputation, traumatic   . Personal history of TIA (transient ischemic attack) 2007   Past Surgical History  Procedure Laterality Date  . Foot surgery      left  . Carpal tunnel release    . Hernia repair    .  Total knee arthroplasty    . Carotid endarterectomy Left 2007    Dr.Hayes  . Amputation      left index finger -tramatic  . Vagotomy      partial gastrectomy  . Partial gastrectomy    . Corneal transplant    . Rotator cuff repair      left  . Spine surgery  1985    cervical laminectomy  . Cardiac catheterization  November 2011    40% mid LAD, 50% proximal RCA, 30-40% distal RCA (DOMINANT RCA with wraparound PDA & 2 large PLBs), 60-70% ostial OM1. No change from 2001. Medical therapy  . Shoulder arthroscopy with rotator cuff repair and subacromial decompression Right 07/01/2013    Procedure: RIGHT SHOULDER  ARTHROSCOPY WITH  SUBACROMIAL DECOMPRESSION/DISTAL CLAVICLE RESECTION/ROTATOR CUFF REPAIR ;  Surgeon: Marin Shutter, MD;  Location: [place];  Service: Orthopedics;  Laterality: Right;  . Coronary angioplasty with stent placement  09/2013; 05/2014    Promus DES 3.0 mm x 28 mm (3.2 mm)  to RCA with STEMI; residual 70% mid-distal LAD, OM 270-80%. Medical management; b) PTCA of 75 % ISR , LAD lesion noted as ~40%  . Nm myoview ltd  11/2013; 08/1014    a) 12/'14: EF 59%, Fixed Inferior-Inferoapical Defect c/w Inferior Scar; No Ischemia; b) 9/'15:  9/'15: EF 55% - persistent fixed inferior-inferoapical perfusion defect  - read as diaphragmatic attenuation, likely consistent with infarct/scar   . Left heart catheterization with coronary angiogram N/A 10/05/2013    Procedure: LEFT HEART CATHETERIZATION WITH CORONARY ANGIOGRAM;  Surgeon: Peter M Martinique, MD;  Location: [facility] CATH LAB;  Service: Cardiovascular;  Laterality: N/A;  . Left heart catheterization with coronary angiogram N/A 06/08/2014    Procedure: LEFT HEART CATHETERIZATION WITH CORONARY ANGIOGRAM;  Surgeon: Troy Sine, MD;  Location: [facility] CATH LAB;  Service: Cardiovascular;  Laterality: N/A;  . Percutaneous coronary intervention-balloon only  06/08/2014    Procedure: PERCUTANEOUS CORONARY INTERVENTION-BALLOON ONLY;  Surgeon: Troy Sine, MD;  Location: [facility] CATH LAB;  Service: Cardiovascular;;    reports that he quit smoking about 50 years ago. His smoking use included Cigarettes. He has never used smokeless tobacco. He reports that he does not drink alcohol or use illicit drugs. family history includes Diabetes in his sister; Heart disease in his sister. Allergies  Allergen Reactions  . Amlodipine Besylate Hives  . Hydralazine Itching  . Lipitor [Atorvastatin] Itching  . Naproxen Diarrhea  . Oxycodone-Acetaminophen Other (See Comments)    REACTION: unspecified   Current Outpatient Prescriptions on File Prior to Visit  Medication Sig Dispense  Refill  . brinzolamide (AZOPT) 1 % ophthalmic suspension Place 1 drop into both eyes every 12 (twelve) hours.     . clopidogrel (PLAVIX) 75 MG tablet Take 1 tablet (75 mg total) by mouth daily. 30 tablet 11  . isosorbide mononitrate (IMDUR) 30 MG 24 hr tablet Take 1 tablet (30 mg total) by mouth daily. 30 tablet 11  . losartan (COZAAR) 50 MG tablet Take 1 tablet (50 mg total) by mouth daily. 30 tablet 11  . metoprolol succinate (TOPROL-XL) 50 MG 24 hr tablet Take 1 tablet (50 mg total) by mouth daily with breakfast. 90 tablet 3  . naphazoline-glycerin (CLEAR EYES) 0.012-0.2 % SOLN Place 1-2 drops into the left eye every 4 (four) hours as needed for irritation.    . nitroGLYCERIN (NITROSTAT) 0.4 MG SL tablet Place 1 tablet (0.4 mg total) under the tongue every 5 (five) minutes as needed.  For chest pain. 25 tablet 6  . TRAVATAN Z 0.004 % SOLN ophthalmic solution Place 1 drop into both eyes at bedtime.      No current facility-administered medications on file prior to visit.     Past Medical History  Diagnosis Date  . CAD in native artery 2001; 2011; 2014; 05/2014    a) 2001 & 2011 -- Med Rx for: 60-70% ostial Cx-OM lesion, 40-50% lesions in the LAD and RC; b) Persantine Myoview 2012: No ischemia or infarction, EF 66% c) 10/'14: RCA 100% - PCI,CxOM stable, LAD ~70% mid; d) UA 6/'15: 75% ISR in RCA - PTCA, LAD lesion ~40%, CxOM stable.    . ST elevation myocardial infarction (STEMI) of inferior wall, emergent PCI with Promus DES to RCA 10/05/2013    100% mRCA occlusion; residual 80% OM2, ~70% LAD --> PCI to RCA  . CAD S/P percutaneous coronary angioplasty 10/05/2013; 05/2014    a) 10/'14: PCI to RCA - Promus Premier DES 3.0 mm x 28 mm to mRCA (~3.19 mm);; b) 6/'15: Cutting Balloon PTCA of 75 % RCA ISR  . Moderate aortic insufficiency February 2012    Echocardiogram: EF > 55%; impaired relaxation. Mild/moderate MAC with mild MR; Moderate Aortic Regurgitation and mild sclerosis/ NO stenosis; mild -  mod Pulm HTN (40-5mmHg);; Follow-up 05/2013: EF 65-70%, PAP ~31 mmH, otherwise no change  . Mild aortic stenosis  10/05/2013    Echo post STEMI: EF 55-60%. No regional W. MA. Mild aortic stenosis with mild regurgitation. MAC. Moderate pulmonary hypertension, 58 mmHg.  . Carotid artery stenosis  December 2012    s/p L CEA  . Hypertension, benign   . Hyperlipidemia LDL goal <70   . CKD (chronic kidney disease), stage III   . COPD (chronic obstructive pulmonary disease)  12/07/2011  . Thrombocytopenia, acquired 06/30/2007  . Anemia, B12 deficiency 11/04/2008  . Personal history of peptic ulcer disease 06/30/2007  . GERD (gastroesophageal reflux disease) 06/30/2007  . Irritable bowel syndrome (IBS)  12/08/2008  . Gallstones  12/07/2011  . Hernia, hiatal  08/25/2008  . 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 History of colonic polyps 08/25/2008  . Diverticulosis of colon 12/08/2008  . Osteoarthritis 05/22/2008; 06/30/2007     with chronic upper and lumbar back pain as well as neck pain.  . Chronic back pain 12/06/2011  . Neck pain, chronic 12/06/2011  . Osteopenia 10/18/2008  . Hypothyroidism 10/18/2008  . Finger amputation, traumatic   . Personal history of TIA (transient ischemic attack) 2007   Past Surgical History  Procedure Laterality Date  . Foot surgery      left  . Carpal tunnel release    . Hernia repair    . Total knee arthroplasty    . Carotid endarterectomy Left 2007    Dr.Hayes  . Amputation      left index finger -tramatic  . Vagotomy      partial gastrectomy  . Partial gastrectomy    . Corneal transplant    . Rotator cuff repair      left  . Spine surgery  1985    cervical laminectomy  . Cardiac catheterization  November 2011    40% mid LAD, 50% proximal RCA, 30-40% distal RCA (DOMINANT RCA with wraparound PDA & 2 large PLBs), 60-70% ostial OM1. No change from 2001. Medical therapy  . Shoulder arthroscopy with rotator cuff repair and subacromial decompression  Right 07/01/2013    Procedure: RIGHT SHOULDER ARTHROSCOPY WITH  SUBACROMIAL DECOMPRESSION/DISTAL CLAVICLE RESECTION/ROTATOR  CUFF REPAIR ;  Surgeon: Marin Shutter, MD;  Location: [place];  Service: Orthopedics;  Laterality: Right;  . Coronary angioplasty with stent placement  09/2013; 05/2014    Promus DES 3.0 mm x 28 mm (3.2 mm)  to RCA with STEMI; residual 70% mid-distal LAD, OM 270-80%. Medical management; b) PTCA of 75 % ISR , LAD lesion noted as ~40%  . Nm myoview ltd  11/2013; 08/1014    a) 12/'14: EF 59%, Fixed Inferior-Inferoapical Defect c/w Inferior Scar; No Ischemia; b) 9/'15:  9/'15: EF 55% - persistent fixed inferior-inferoapical perfusion defect  - read as diaphragmatic attenuation, likely consistent with infarct/scar   . Left heart catheterization with coronary angiogram N/A 10/05/2013    Procedure: LEFT HEART CATHETERIZATION WITH CORONARY ANGIOGRAM;  Surgeon: Peter M Martinique, MD;  Location: [facility] CATH LAB;  Service: Cardiovascular;  Laterality: N/A;  . Left heart catheterization with coronary angiogram N/A 06/08/2014    Procedure: LEFT HEART CATHETERIZATION WITH CORONARY ANGIOGRAM;  Surgeon: Troy Sine, MD;  Location: [facility] CATH LAB;  Service: Cardiovascular;  Laterality: N/A;  . Percutaneous coronary intervention-balloon only  06/08/2014    Procedure: PERCUTANEOUS CORONARY INTERVENTION-BALLOON ONLY;  Surgeon: Troy Sine, MD;  Location: [facility] CATH LAB;  Service: Cardiovascular;;    reports that he quit smoking about 50 years ago. His smoking use included Cigarettes. He has never used smokeless tobacco. He reports that he does not drink alcohol or use illicit drugs. family history includes Diabetes in his sister; Heart disease in his sister. Allergies  Allergen Reactions  . Amlodipine Besylate Hives  . Hydralazine Itching  . Lipitor [Atorvastatin] Itching  . Naproxen Diarrhea  . Oxycodone-Acetaminophen Other (See Comments)    REACTION: unspecified   Current Outpatient Prescriptions on  File Prior to Visit  Medication Sig Dispense Refill  . brinzolamide (AZOPT) 1 % ophthalmic suspension Place 1 drop into both eyes every 12 (twelve) hours.     . clopidogrel (PLAVIX) 75 MG tablet Take 1 tablet (75 mg total) by mouth daily. 30 tablet 11  . isosorbide mononitrate (IMDUR) 30 MG 24 hr tablet Take 1 tablet (30 mg total) by mouth daily. 30 tablet 11  . losartan (COZAAR) 50 MG tablet Take 1 tablet (50 mg total) by mouth daily. 30 tablet 11  . metoprolol succinate (TOPROL-XL) 50 MG 24 hr tablet Take 1 tablet (50 mg total) by mouth daily with breakfast. 90 tablet 3  . naphazoline-glycerin (CLEAR EYES) 0.012-0.2 % SOLN Place 1-2 drops into the left eye every 4 (four) hours as needed for irritation.    . nitroGLYCERIN (NITROSTAT) 0.4 MG SL tablet Place 1 tablet (0.4 mg total) under the tongue every 5 (five) minutes as needed. For chest pain. 25 tablet 6  . TRAVATAN Z 0.004 % SOLN ophthalmic solution Place 1 drop into both eyes at bedtime.      No current facility-administered medications on file prior to visit.   Review of Systems  Constitutional: Negative for unusual diaphoresis or other sweats  HENT: Negative for ringing in ear Eyes: Negative for double vision or worsening visual disturbance.  Respiratory: Negative for choking and stridor.   Gastrointestinal: Negative for vomiting or other signifcant bowel change Genitourinary: Negative for hematuria or decreased urine volume.  Musculoskeletal: Negative for other MSK pain or swelling Skin: Negative for color change and worsening wound.  Neurological: Negative for tremors and numbness other than noted  Psychiatric/Behavioral: Negative for decreased concentration or agitation other than above  Objective:   Physical Exam BP 140/76 mmHg  Pulse 55  Temp(Src) 98.2 F (36.8 C) (Oral)  Ht 5\' 7"  (1.702 m)  Wt 135 lb 12 oz (61.576 kg)  BMI 21.26 kg/m2  SpO2 99% VS noted,  Constitutional: Pt appears well-developed,  well-nourished.  HENT: Head: NCAT.  Right Ear: External ear normal.  Left Ear: External ear normal.  Eyes: . Pupils are equal, round, and reactive to light. Conjunctivae and EOM are normal Neck: Normal range of motion. Neck supple.  Cardiovascular: Normal rate and regular rhythm.   Pulmonary/Chest: Effort normal and breath sounds without rales or wheezing.  Neurological: Pt is alert. Not confused , motor grossly intact Skin: Skin is warm. No rash Psychiatric: Pt behavior is normal. No agitation. mild nervous Left 4th finger PIP with 1+ joint effusion/tender withslight bruising as well (on plavix)    Assessment & Plan:

## 2015-01-24 NOTE — Patient Instructions (Signed)
Please take all new medication as prescribed - the protonix 40 mg for reflux  OK to use tylenol as needed for the finger pain  Please continue all other medications as before, and refills have been done if requested.  Please have the pharmacy call with any other refills you may need.  Please keep your appointments with your specialists as you may have planned

## 2015-01-24 NOTE — Assessment & Plan Note (Signed)
For protonix 40 qd, anti-reflux precautions,  to f/u any worsening symptoms or concerns

## 2015-01-24 NOTE — Assessment & Plan Note (Addendum)
ECG reviewed as per emr, symtpoms C/w neurological related to age, pt to sit on side of bed when first getting OOB  For 1-2 min to avoid falling

## 2015-01-24 NOTE — Assessment & Plan Note (Signed)
Resolved with stopping asa,  to f/u any worsening symptoms or concerns, cont plavix only

## 2015-01-24 NOTE — Progress Notes (Signed)
Pre visit review using our clinic review tool, if applicable. No additional management support is needed unless otherwise documented below in the visit note. 

## 2015-01-25 DIAGNOSIS — H4011X2 Primary open-angle glaucoma, moderate stage: Secondary | ICD-10-CM | POA: Diagnosis not present

## 2015-02-24 ENCOUNTER — Ambulatory Visit: Payer: Medicare Other | Admitting: Internal Medicine

## 2015-03-01 DIAGNOSIS — M25572 Pain in left ankle and joints of left foot: Secondary | ICD-10-CM | POA: Diagnosis not present

## 2015-03-24 DIAGNOSIS — M25572 Pain in left ankle and joints of left foot: Secondary | ICD-10-CM | POA: Diagnosis not present

## 2015-04-12 DIAGNOSIS — M25572 Pain in left ankle and joints of left foot: Secondary | ICD-10-CM | POA: Diagnosis not present

## 2015-04-22 DIAGNOSIS — M25572 Pain in left ankle and joints of left foot: Secondary | ICD-10-CM | POA: Diagnosis not present

## 2015-04-25 DIAGNOSIS — H1851 Endothelial corneal dystrophy: Secondary | ICD-10-CM | POA: Diagnosis not present

## 2015-05-03 DIAGNOSIS — M25572 Pain in left ankle and joints of left foot: Secondary | ICD-10-CM | POA: Diagnosis not present

## 2015-05-31 ENCOUNTER — Ambulatory Visit: Payer: Medicare Other | Admitting: Internal Medicine

## 2015-06-13 ENCOUNTER — Ambulatory Visit: Payer: 59 | Admitting: Cardiology

## 2015-06-13 ENCOUNTER — Ambulatory Visit (INDEPENDENT_AMBULATORY_CARE_PROVIDER_SITE_OTHER): Payer: Medicare Other | Admitting: Cardiology

## 2015-06-13 ENCOUNTER — Encounter: Payer: Self-pay | Admitting: Cardiology

## 2015-06-13 VITALS — BP 138/64 | HR 60 | Ht 67.0 in | Wt 132.2 lb

## 2015-06-13 DIAGNOSIS — Z9861 Coronary angioplasty status: Secondary | ICD-10-CM

## 2015-06-13 DIAGNOSIS — I2510 Atherosclerotic heart disease of native coronary artery without angina pectoris: Secondary | ICD-10-CM | POA: Diagnosis not present

## 2015-06-13 DIAGNOSIS — I2119 ST elevation (STEMI) myocardial infarction involving other coronary artery of inferior wall: Secondary | ICD-10-CM

## 2015-06-13 DIAGNOSIS — E785 Hyperlipidemia, unspecified: Secondary | ICD-10-CM

## 2015-06-13 DIAGNOSIS — I208 Other forms of angina pectoris: Secondary | ICD-10-CM

## 2015-06-13 DIAGNOSIS — I10 Essential (primary) hypertension: Secondary | ICD-10-CM

## 2015-06-13 DIAGNOSIS — R6889 Other general symptoms and signs: Secondary | ICD-10-CM

## 2015-06-13 DIAGNOSIS — I350 Nonrheumatic aortic (valve) stenosis: Secondary | ICD-10-CM | POA: Diagnosis not present

## 2015-06-13 DIAGNOSIS — I351 Nonrheumatic aortic (valve) insufficiency: Secondary | ICD-10-CM

## 2015-06-13 MED ORDER — METOPROLOL SUCCINATE ER 25 MG PO TB24: 25.0000 mg | ORAL_TABLET | Freq: Every day | ORAL | Status: DC

## 2015-06-13 NOTE — Progress Notes (Signed)
PCP: Cathlean Cower, MD  Clinic Note: Chief Complaint  Patient presents with  . Follow-up    94month exam-decrease in energy; no chest pain, shortness of breath- with exertion, no edema, no pain in legs, no cramping in legs, no lightheadedness, no dizziness    HPI: Nicholas Porter is a 81 y.o. male with a PMH below who presents today for 6 month f/u - CAD-PCI (h/o STEMI), mild AS. Last seen Dec 2015.  Cardiac cath 05/2014 - PTCA of RCA ISR.  Myoview 08/2014: Low risk stress nuclear study Diaphragmatic attenuation artifact.; Normal LV Function / wall motion. No Infarct/ischemia.   Past Medical History  Diagnosis Date  . CAD in native artery 2001; 2011; 2014; 05/2014    a) 2001 & 2011 -- Med Rx for: 60-70% ostial Cx-OM lesion, 40-50% lesions in the LAD and RC; b) Persantine Myoview 2012: No ischemia or infarction, EF 66% c) 10/'14: RCA 100% - PCI,CxOM stable, LAD ~70% mid; d) UA 6/'15: 75% ISR in RCA - PTCA, LAD lesion ~40%, CxOM stable.    . ST elevation myocardial infarction (STEMI) of inferior wall, emergent PCI with Promus DES to RCA 10/05/2013    100% mRCA occlusion; residual 80% OM2, ~70% LAD --> PCI to RCA  . CAD S/P percutaneous coronary angioplasty 10/05/2013; 05/2014    a) 10/'14: PCI to RCA - Promus Premier DES 3.0 mm x 28 mm to mRCA (~3.19 mm);; b) 6/'15: Cutting Balloon PTCA of 75 % RCA ISR  . Moderate aortic insufficiency February 2012    Echocardiogram: EF > 55%; impaired relaxation. Mild/moderate MAC with mild MR; Moderate Aortic Regurgitation and mild sclerosis/ NO stenosis; mild - mod Pulm HTN (40-42mmHg);; Follow-up 05/2013: EF 65-70%, PAP ~31 mmH, otherwise no change  . Mild aortic stenosis  10/05/2013    Echo post STEMI: EF 55-60%. No regional W. MA. Mild aortic stenosis with mild regurgitation. MAC. Moderate pulmonary hypertension, 58 mmHg.  . Carotid artery stenosis  December 2012    s/p L CEA  . Hypertension, benign   . Hyperlipidemia LDL goal <70   . CKD (chronic  kidney disease), stage III   . COPD (chronic obstructive pulmonary disease)  12/07/2011  . Thrombocytopenia, acquired 06/30/2007  . Anemia, B12 deficiency 11/04/2008  . Personal history of peptic ulcer disease 06/30/2007  . GERD (gastroesophageal reflux disease) 06/30/2007  . Irritable bowel syndrome (IBS)  12/08/2008  . Gallstones  12/07/2011  . Hernia, hiatal  08/25/2008  . 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 History of colonic polyps 08/25/2008  . Diverticulosis of colon 12/08/2008  . Osteoarthritis 05/22/2008; 06/30/2007     with chronic upper and lumbar back pain as well as neck pain.  . Chronic back pain 12/06/2011  . Neck pain, chronic 12/06/2011  . Osteopenia 10/18/2008  . Hypothyroidism 10/18/2008  . Finger amputation, traumatic   . Personal history of TIA (transient ischemic attack) 2007  . GERD (gastroesophageal reflux disease) 01/24/2015    Prior Cardiac Evaluation and Past Surgical History: Past Surgical History  Procedure Laterality Date  . Foot surgery      left  . Carpal tunnel release    . Hernia repair    . Total knee arthroplasty    . Carotid endarterectomy Left 2007    Dr.Hayes  . Amputation      left index finger -tramatic  . Vagotomy      partial gastrectomy  . Partial gastrectomy    . Corneal transplant    . Rotator cuff  repair      left  . Spine surgery  1985    cervical laminectomy  . Cardiac catheterization  November 2011    40% mid LAD, 50% proximal RCA, 30-40% distal RCA (DOMINANT RCA with wraparound PDA & 2 large PLBs), 60-70% ostial OM1. No change from 2001. Medical therapy  . Shoulder arthroscopy with rotator cuff repair and subacromial decompression Right 07/01/2013    Procedure: RIGHT SHOULDER ARTHROSCOPY WITH  SUBACROMIAL DECOMPRESSION/DISTAL CLAVICLE RESECTION/ROTATOR CUFF REPAIR ;  Surgeon: Marin Shutter, MD;  Location: [place];  Service: Orthopedics;  Laterality: Right;  . Nm myoview ltd  11/2013; 08/1014    a) 12/'14: EF 59%, Fixed  Inferior-Inferoapical Defect c/w Inferior Scar; No Ischemia; b) 9/'15:  9/'15: EF 55% - persistent fixed inferior-inferoapical perfusion defect  - read as diaphragmatic attenuation, likely consistent with infarct/scar   . Left heart catheterization with coronary angiogram N/A 10/05/2013    Procedure: LEFT HEART CATHETERIZATION WITH CORONARY ANGIOGRAM;  Surgeon: Peter M Martinique, MD;  Location: [facility] CATH LAB;  Service: Cardiovascular;  RCA 100% - PCI,CxOM stable, LAD ~70% mid;  . Left heart catheterization with coronary angiogram N/A 06/08/2014    Procedure: LEFT HEART CATHETERIZATION WITH CORONARY ANGIOGRAM;  Surgeon: Troy Sine, MD;  Location: [facility] CATH LAB;  Service: Cardiovascular;  UA 6/'15: 75% ISR in RCA - PTCA, LAD lesion ~40%, CxOM stable.    . Percutaneous coronary intervention-balloon only  06/08/2014    Procedure: PERCUTANEOUS CORONARY INTERVENTION-BALLOON ONLY;  Surgeon: Troy Sine, MD;  Location: [facility] CATH LAB;  Service: Cardiovascular;;  . Coronary angioplasty with stent placement  09/2013; 05/2014     d) Promus DES 3.0 mm x 28 mm (3.2 mm)  to RCA with STEMI; residual 70% mid-distal LAD, OM 270-80%. Medical management; b) PTCA of 75 % ISR , LAD lesion noted as ~40%    Interval History: Nicholas Porter presents today for routine followup really only noting that he has reduced energy. He just feels that he "gives out soon as he used to. His legs give out and he gets more tired. He is not certain of any symptoms suggestive of angina no recently, but did have a couple episodes in the last few months when he said the nitroglycerin. For the most part, however he is noting that he just has a part-time maintaining any kind of exercise or activity for a longer time period. He also notes overall exercise intolerance.  Cardiovascular ROS: positive for - dyspnea on exertion and 3-4 episodes of angina decubitus, decrease exercise tolerance. negative for - chest pain, edema, irregular heartbeat, murmur, orthopnea,  palpitations, paroxysmal nocturnal dyspnea, rapid heart rate, shortness of breath or TIA/amaurosis fugax, syncope/near syncope No claudication.  ROS: A comprehensive was performed. Review of Systems  Constitutional: Positive for weight loss and malaise/fatigue (Mostly exercise intolerance as indicated above). Negative for fever and chills.  Eyes: Negative for blurred vision.  Respiratory: Negative.   Cardiovascular:       Per history of present illness  Gastrointestinal: Negative for blood in stool and melena.  Genitourinary: Negative for hematuria.  Musculoskeletal: Positive for joint pain.  Neurological: Positive for dizziness (Less notable in the morning than it used to be.).  Endo/Heme/Allergies: Bruises/bleeds easily.  Psychiatric/Behavioral: Negative.   All other systems reviewed and are negative.   Current Outpatient Prescriptions on File Prior to Visit  Medication Sig Dispense Refill  . brinzolamide (AZOPT) 1 % ophthalmic suspension Place 1 drop into both eyes every 12 (twelve) hours.     Marland Kitchen  isosorbide mononitrate (IMDUR) 30 MG 24 hr tablet Take 1 tablet (30 mg total) by mouth daily. 30 tablet 11  . losartan (COZAAR) 50 MG tablet Take 1 tablet (50 mg total) by mouth daily. 30 tablet 11  . naphazoline-glycerin (CLEAR EYES) 0.012-0.2 % SOLN Place 1-2 drops into the left eye every 4 (four) hours as needed for irritation.    . nitroGLYCERIN (NITROSTAT) 0.4 MG SL tablet Place 1 tablet (0.4 mg total) under the tongue every 5 (five) minutes as needed. For chest pain. 25 tablet 6  . pantoprazole (PROTONIX) 40 MG tablet Take 1 tablet (40 mg total) by mouth daily. 30 tablet 11  . TRAVATAN Z 0.004 % SOLN ophthalmic solution Place 1 drop into both eyes at bedtime.      No current facility-administered medications on file prior to visit.   Allergies  Allergen Reactions  . Amlodipine Besylate Hives  . Hydralazine Itching  . Lipitor [Atorvastatin] Itching  . Naproxen Diarrhea  .  Oxycodone-Acetaminophen Other (See Comments)    REACTION: unspecified    History  Substance Use Topics  . Smoking status: Former Smoker    Types: Cigarettes    Quit date: 12/23/1964  . Smokeless tobacco: Never Used  . Alcohol Use: No   family history includes Diabetes in his sister; Heart disease in his sister.  Wt Readings from Last 3 Encounters:  06/13/15 59.966 kg (132 lb 3.2 oz)  01/24/15 61.576 kg (135 lb 12 oz)  12/13/14 61.78 kg (136 lb 3.2 oz)    PHYSICAL EXAM BP 138/64 mmHg  Pulse 60  Ht 5\' 7"  (1.702 m)  Wt 59.966 kg (132 lb 3.2 oz)  BMI 20.70 kg/m2 General: alert, cooperative, appears stated age, no distress and Well-groomed. Well-nourished. Normal mood and affect. Answers questions appropriately, but tends to ramble &get off track.  HEENT: [place]/AT, EOMI, MMM, anicteric sclera; arcus senilis -- several tissues with streaks of bloody mucous is noted in the trash can (some from his nose & some that he "spit-up" -- not cough) Neck: no adenopathy, no JVD, supple, symmetrical; No LAN. Soft left-sided carotid bruit.  Lungs: mild interstitial rhonchi, but no Wheeze or Rales, normal percussion bilaterally and Nonlabored, good air movement  Heart: RRR, normal S1 and S2. Soft 1-2/6 early peaking c-d SEM at RUSB as well as ~1/6 DM in same location  Abdomen: Soft with mild right upper quadrant and epigastric tenderness. NABS. No rebound  Extremities: extremities normal, atraumatic, no cyanosis or edema  Pulses: 2+ and symmetric  Skin: Skin color, texture, turgor normal. No rashes or lesions or Just mild bruising from being on antiplatelet agents.  Neurologic: Grossly normal     Adult ECG Report - not checked  Recent Labs:  No new labs   Other studies Reviewed: Additional studies/ records that were reviewed today include:  Review of the above records demonstrates:   ASSESSMENT / PLAN: Problem List Items Addressed This Visit    Angina decubitus    Probably  related to small vessel disease and potentially mild bowing of blood. I will change Imdur to each bedtime. May need to increase ARB dose with reduction of Toprol. No signs of volume overload, we'll need to monitor for these symptoms as low dose diuretic may be appropriate.      Relevant Medications   metoprolol succinate (TOPROL-XL) 25 MG 24 hr tablet   CAD S/P percutaneous coronary angioplasty - DES in RCA with PTCA for ISR; Moderate mLAD, 80% ostial OM2 stable - Primary (  Chronic)    Recent Myoview was negative. He is not noting any chest tightness or pressure, simply reduced exercise tolerance. Continue Plavix without aspirin along with Toprol, Cozaar and losartan with changes noted above. He sounds like he is having some angina decubitus symptoms and therefore we will change his Imdur dosing from morning to night.      Relevant Medications   metoprolol succinate (TOPROL-XL) 25 MG 24 hr tablet   Essential hypertension (Chronic)    Stable blood pressure with increased dose of Cozaar. Reducing Toprol, may need to increase further.      Relevant Medications   metoprolol succinate (TOPROL-XL) 25 MG 24 hr tablet   Exercise intolerance (Chronic)    Is really describing symptoms of fatigue and we're in some, but concern with this higher dose beta blocker and pulse rate is 60. Will reduce Toprol to 25 mg daily. May need to increase Cozaar.      H/O Inferior STEMI (09/2013) emergent PCI with Promus DES to RCA (3.0 mm  x 28 mm - 3.2 mm) (Chronic)    Follow Myoview did not show signs of infarct. No heart failure symptoms per se.      Relevant Medications   metoprolol succinate (TOPROL-XL) 25 MG 24 hr tablet   Hyperlipidemia with target LDL less than 70; no longer On Atorvastatin (Chronic)    Labs are being monitored by PCP. I have not seen a recent lab check. May want to consider at least Zetia if not low dose Crestor.      Relevant Medications   metoprolol succinate (TOPROL-XL) 25 MG 24 hr  tablet   Mild aortic stenosis (Chronic)   Relevant Medications   metoprolol succinate (TOPROL-XL) 25 MG 24 hr tablet   Moderate aortic insufficiency (Chronic)    Important to monitor hypertension. At the reduction is crucial in reducing functional AI. He does have mild aortic stenosis which we're following. Will probably need a followup echocardiogram next followup visit.      Relevant Medications   metoprolol succinate (TOPROL-XL) 25 MG 24 hr tablet      Current medicines are reviewed at length with the patient today. (+/- concerns) - N/a The following changes have been made:   DECREASE DOSE OF METOPROLOL SUCC. 1/2 TABLET OF  50 MG ( WHICH WILL EQUAL 25 MG DOSE) IN THE MORNING.  --new Rx for 25 mg  Change Losartan dose to AM  Take Imdur QHS - for angina decubitus  Decrease salt intakt   Walk 2-3 x daily with rest as needed  labs/ tests ordered today include:   No orders of the defined types were placed in this encounter.   Meds ordered this encounter  Medications  . metoprolol succinate (TOPROL-XL) 25 MG 24 hr tablet    Sig: Take 1 tablet (25 mg total) by mouth daily.    Dispense:  90 tablet    Refill:  3    PATIENT WILL CALL WHEN BOTTLE IS NEEDED     Followup: 55months , 3minutes   Corrin Sieling, Leonie Green, M.D., M.S. Interventional Cardiologist   Pager # [phone]

## 2015-06-13 NOTE — Patient Instructions (Addendum)
KEEP WALKING A COUPLE TIMES A DAY - STOP AND REST IF NEEDED.  WATCH SALT INTAKE  DECREASE DOSE OF METOPROLOL SUCC. 1/2 TABLET OF  50 MG ( WHICH WILL EQUAL 25 MG DOSE) IN THE MORNING.   CALL PHARMACY WHEN YOU NEED A NEW BOTTLE OF 25 MG METOPROLOL SUCC. ( NEW PRESCRIPTION.    CHANGE  WITH TAKING LOSARTAN IN THE MORNING .  TAKE IMDUR (ISOSORBIDE)  AT BEDTIME.    Your physician wants you to follow-up in [place].  You will receive a reminder letter in the mail two months in advance. If you don't receive a letter, please call our office to schedule the follow-up appointment.

## 2015-06-14 DIAGNOSIS — M25572 Pain in left ankle and joints of left foot: Secondary | ICD-10-CM | POA: Diagnosis not present

## 2015-06-15 ENCOUNTER — Other Ambulatory Visit: Payer: Self-pay | Admitting: Cardiology

## 2015-06-15 ENCOUNTER — Encounter: Payer: Self-pay | Admitting: Cardiology

## 2015-06-15 NOTE — Assessment & Plan Note (Signed)
Probably related to small vessel disease and potentially mild bowing of blood. I will change Imdur to each bedtime. May need to increase ARB dose with reduction of Toprol. No signs of volume overload, we'll need to monitor for these symptoms as low dose diuretic may be appropriate.

## 2015-06-15 NOTE — Assessment & Plan Note (Signed)
Recent Myoview was negative. He is not noting any chest tightness or pressure, simply reduced exercise tolerance. Continue Plavix without aspirin along with Toprol, Cozaar and losartan with changes noted above. He sounds like he is having some angina decubitus symptoms and therefore we will change his Imdur dosing from morning to night.

## 2015-06-15 NOTE — Telephone Encounter (Signed)
REFILL 

## 2015-06-15 NOTE — Assessment & Plan Note (Signed)
Labs are being monitored by PCP. I have not seen a recent lab check. May want to consider at least Zetia if not low dose Crestor.

## 2015-06-15 NOTE — Assessment & Plan Note (Signed)
Stable blood pressure with increased dose of Cozaar. Reducing Toprol, may need to increase further.

## 2015-06-15 NOTE — Assessment & Plan Note (Signed)
Follow Myoview did not show signs of infarct. No heart failure symptoms per se.

## 2015-06-15 NOTE — Assessment & Plan Note (Signed)
Is really describing symptoms of fatigue and we're in some, but concern with this higher dose beta blocker and pulse rate is 60. Will reduce Toprol to 25 mg daily. May need to increase Cozaar.

## 2015-06-15 NOTE — Assessment & Plan Note (Signed)
Important to monitor hypertension. At the reduction is crucial in reducing functional AI. He does have mild aortic stenosis which we're following. Will probably need a followup echocardiogram next followup visit.

## 2015-06-29 DIAGNOSIS — H02054 Trichiasis without entropian left upper eyelid: Secondary | ICD-10-CM | POA: Diagnosis not present

## 2015-07-20 ENCOUNTER — Other Ambulatory Visit (INDEPENDENT_AMBULATORY_CARE_PROVIDER_SITE_OTHER): Payer: Medicare Other

## 2015-07-20 ENCOUNTER — Ambulatory Visit (INDEPENDENT_AMBULATORY_CARE_PROVIDER_SITE_OTHER): Payer: Medicare Other | Admitting: Internal Medicine

## 2015-07-20 ENCOUNTER — Encounter: Payer: Self-pay | Admitting: Internal Medicine

## 2015-07-20 VITALS — BP 118/60 | HR 65 | Temp 98.5°F | Ht 67.0 in | Wt 133.0 lb

## 2015-07-20 DIAGNOSIS — I208 Other forms of angina pectoris: Secondary | ICD-10-CM | POA: Diagnosis not present

## 2015-07-20 DIAGNOSIS — N183 Chronic kidney disease, stage 3 unspecified: Secondary | ICD-10-CM

## 2015-07-20 DIAGNOSIS — I10 Essential (primary) hypertension: Secondary | ICD-10-CM

## 2015-07-20 DIAGNOSIS — E785 Hyperlipidemia, unspecified: Secondary | ICD-10-CM

## 2015-07-20 DIAGNOSIS — Z23 Encounter for immunization: Secondary | ICD-10-CM

## 2015-07-20 DIAGNOSIS — L03114 Cellulitis of left upper limb: Secondary | ICD-10-CM | POA: Diagnosis not present

## 2015-07-20 LAB — BASIC METABOLIC PANEL
BUN: 37 mg/dL — ABNORMAL HIGH (ref 6–23)
CHLORIDE: 109 meq/L (ref 96–112)
CO2: 25 mEq/L (ref 19–32)
Calcium: 9.2 mg/dL (ref 8.4–10.5)
Creatinine, Ser: 1.92 mg/dL — ABNORMAL HIGH (ref 0.40–1.50)
GFR: 35.68 mL/min — AB (ref >60.00)
GLUCOSE: 86 mg/dL (ref 70–99)
Potassium: 5.3 mEq/L — ABNORMAL HIGH (ref 3.5–5.1)
Sodium: 140 mEq/L (ref 135–145)

## 2015-07-20 LAB — URINALYSIS, ROUTINE W REFLEX MICROSCOPIC
BILIRUBIN URINE: NEGATIVE
HGB URINE DIPSTICK: NEGATIVE
KETONES UR: NEGATIVE
Leukocytes, UA: NEGATIVE
Nitrite: NEGATIVE
PH: 5.5 (ref 5.0–8.0)
RBC / HPF: NONE SEEN (ref 0–?)
Specific Gravity, Urine: 1.02 (ref 1.000–1.030)
Total Protein, Urine: NEGATIVE
UROBILINOGEN UA: 0.2 (ref 0.0–1.0)
Urine Glucose: NEGATIVE

## 2015-07-20 LAB — CBC WITH DIFFERENTIAL/PLATELET
BASOS ABS: 0 10*3/uL (ref 0.0–0.1)
Basophils Relative: 0.3 % (ref 0.0–3.0)
EOS ABS: 0.2 10*3/uL (ref 0.0–0.7)
EOS PCT: 3.3 % (ref 0.0–5.0)
HEMATOCRIT: 34.6 % — AB (ref 39.0–52.0)
Hemoglobin: 11.4 g/dL — ABNORMAL LOW (ref 13.0–17.0)
LYMPHS PCT: 32.2 % (ref 12.0–46.0)
Lymphs Abs: 2.2 10*3/uL (ref 0.7–4.0)
MCHC: 33 g/dL (ref 30.0–36.0)
MCV: 87.3 fl (ref 78.0–100.0)
MONO ABS: 0.8 10*3/uL (ref 0.1–1.0)
Monocytes Relative: 11.1 % (ref 3.0–12.0)
NEUTROS PCT: 53.1 % (ref 43.0–77.0)
Neutro Abs: 3.6 10*3/uL (ref 1.4–7.7)
PLATELETS: 119 10*3/uL — AB (ref 150.0–400.0)
RBC: 3.97 Mil/uL — ABNORMAL LOW (ref 4.22–5.81)
RDW: 15.6 % — AB (ref 11.5–15.5)
WBC: 6.8 10*3/uL (ref 4.0–10.5)

## 2015-07-20 LAB — LIPID PANEL
Cholesterol: 122 mg/dL (ref 0–200)
HDL: 37.9 mg/dL — AB (ref >39.00)
LDL Cholesterol: 63 mg/dL (ref 0–99)
NonHDL: 84.15
Total CHOL/HDL Ratio: 3
Triglycerides: 106 mg/dL (ref 0.0–149.0)
VLDL: 21.2 mg/dL (ref 0.0–40.0)

## 2015-07-20 LAB — HEPATIC FUNCTION PANEL
ALK PHOS: 82 U/L (ref 39–117)
ALT: 7 U/L (ref 0–53)
AST: 14 U/L (ref 0–37)
Albumin: 3.9 g/dL (ref 3.5–5.2)
Bilirubin, Direct: 0.1 mg/dL (ref 0.0–0.3)
TOTAL PROTEIN: 6.6 g/dL (ref 6.0–8.3)
Total Bilirubin: 0.5 mg/dL (ref 0.2–1.2)

## 2015-07-20 LAB — TSH: TSH: 3.84 u[IU]/mL (ref 0.35–4.50)

## 2015-07-20 MED ORDER — DOXYCYCLINE HYCLATE 100 MG PO TABS: 100.0000 mg | ORAL_TABLET | Freq: Two times a day (BID) | ORAL | Status: DC

## 2015-07-20 MED ORDER — PNEUMOCOCCAL 13-VAL CONJ VACC IM SUSP
0.5000 mL | Freq: Once | INTRAMUSCULAR | Status: AC
  Administered 2015-07-20: 0.5 mL via INTRAMUSCULAR

## 2015-07-20 NOTE — Assessment & Plan Note (Signed)
Mild to mod, for antibx course,  to f/u any worsening symptoms or concerns 

## 2015-07-20 NOTE — Patient Instructions (Addendum)
You had the new Prevnar pneumonia shot today  Please take all new medication as prescribed - the antibiotic  Please continue all other medications as before, and refills have been done if requested.  Please have the pharmacy call with any other refills you may need.  Please continue your efforts at being more active, low cholesterol diet, and weight control.  You are otherwise up to date with prevention measures today.  Please keep your appointments with your specialists as you may have planned  Please go to the LAB in the Basement (turn left off the elevator) for the tests to be done today  You will be contacted by phone if any changes need to be made immediately.  Otherwise, you will receive a letter about your results with an explanation, but please check with MyChart first.  Please remember to sign up for MyChart if you have not done so, as this will be important to you in the future with finding out test results, communicating by private email, and scheduling acute appointments online when needed.  Please return in 6 months, or sooner if needed

## 2015-07-20 NOTE — Assessment & Plan Note (Signed)
stable overall by history and exam, recent data reviewed with pt, and pt to continue medical treatment as before,  to f/u any worsening symptoms or concerns BP Readings from Last 3 Encounters:  07/20/15 118/60  06/13/15 138/64  01/24/15 140/76

## 2015-07-20 NOTE — Addendum Note (Signed)
Addended by: Lyman Bishop on: 07/20/2015 10:45 AM   Modules accepted: Orders

## 2015-07-20 NOTE — Progress Notes (Signed)
Pre visit review using our clinic review tool, if applicable. No additional management support is needed unless otherwise documented below in the visit note. 

## 2015-07-20 NOTE — Progress Notes (Signed)
Subjective:    Patient ID: Nicholas Porter, male    DOB: 1930-02-17, 81 y.o.   MRN: [id number]  HPI  Here for yearly f/u;  Overall doing ok;  Pt denies Chest pain, worsening SOB, DOE, wheezing, orthopnea, PND, worsening LE edema, palpitations, dizziness or syncope.  Pt denies neurological change such as new headache, facial or extremity weakness.  Pt denies polydipsia, polyuria, or low sugar symptoms. Pt states overall good compliance with treatment and medications, good tolerability, and has been trying to follow appropriate diet.  Pt denies worsening depressive symptoms, suicidal ideation or panic. No fever, night sweats, wt loss, loss of appetite, or other constitutional symptoms.  Pt states good ability with ADL's, has low fall risk, home safety reviewed and adequate, no other significant changes in hearing or vision, and occasionally active with exercise, tends to be more active for his age  Did have a slip and fall with skin tear to near left elbow x 3 days, now with increased red/tender/swelling .  Due for prevnar Past Medical History  Diagnosis Date  . CAD in native artery 2001; 2011; 2014; 05/2014    a) 2001 & 2011 -- Med Rx for: 60-70% ostial Cx-OM lesion, 40-50% lesions in the LAD and RC; b) Persantine Myoview 2012: No ischemia or infarction, EF 66% c) 10/'14: RCA 100% - PCI,CxOM stable, LAD ~70% mid; d) UA 6/'15: 75% ISR in RCA - PTCA, LAD lesion ~40%, CxOM stable.    . ST elevation myocardial infarction (STEMI) of inferior wall, emergent PCI with Promus DES to RCA 10/05/2013    100% mRCA occlusion; residual 80% OM2, ~70% LAD --> PCI to RCA  . CAD S/P percutaneous coronary angioplasty 10/05/2013; 05/2014    a) 10/'14: PCI to RCA - Promus Premier DES 3.0 mm x 28 mm to mRCA (~3.19 mm);; b) 6/'15: Cutting Balloon PTCA of 75 % RCA ISR  . Moderate aortic insufficiency February 2012    Echocardiogram: EF > 55%; impaired relaxation. Mild/moderate MAC with mild MR; Moderate Aortic Regurgitation and  mild sclerosis/ NO stenosis; mild - mod Pulm HTN (40-28mmHg);; Follow-up 05/2013: EF 65-70%, PAP ~31 mmH, otherwise no change  . Mild aortic stenosis  10/05/2013    Echo post STEMI: EF 55-60%. No regional W. MA. Mild aortic stenosis with mild regurgitation. MAC. Moderate pulmonary hypertension, 58 mmHg.  . Carotid artery stenosis  December 2012    s/p L CEA  . Hypertension, benign   . Hyperlipidemia LDL goal <70   . CKD (chronic kidney disease), stage III   . COPD (chronic obstructive pulmonary disease)  12/07/2011  . Thrombocytopenia, acquired 06/30/2007  . Anemia, B12 deficiency 11/04/2008  . Personal history of peptic ulcer disease 06/30/2007  . GERD (gastroesophageal reflux disease) 06/30/2007  . Irritable bowel syndrome (IBS)  12/08/2008  . Gallstones  12/07/2011  . Hernia, hiatal  08/25/2008  . 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 History of colonic polyps 08/25/2008  . Diverticulosis of colon 12/08/2008  . Osteoarthritis 05/22/2008; 06/30/2007     with chronic upper and lumbar back pain as well as neck pain.  . Chronic back pain 12/06/2011  . Neck pain, chronic 12/06/2011  . Osteopenia 10/18/2008  . Hypothyroidism 10/18/2008  . Finger amputation, traumatic   . Personal history of TIA (transient ischemic attack) 2007  . GERD (gastroesophageal reflux disease) 01/24/2015   Past Surgical History  Procedure Laterality Date  . Foot surgery      left  . Carpal tunnel release    .  Hernia repair    . Total knee arthroplasty    . Carotid endarterectomy Left 2007    Dr.Hayes  . Amputation      left index finger -tramatic  . Vagotomy      partial gastrectomy  . Partial gastrectomy    . Corneal transplant    . Rotator cuff repair      left  . Spine surgery  1985    cervical laminectomy  . Cardiac catheterization  November 2011    40% mid LAD, 50% proximal RCA, 30-40% distal RCA (DOMINANT RCA with wraparound PDA & 2 large PLBs), 60-70% ostial OM1. No change from 2001. Medical  therapy  . Shoulder arthroscopy with rotator cuff repair and subacromial decompression Right 07/01/2013    Procedure: RIGHT SHOULDER ARTHROSCOPY WITH  SUBACROMIAL DECOMPRESSION/DISTAL CLAVICLE RESECTION/ROTATOR CUFF REPAIR ;  Surgeon: Marin Shutter, MD;  Location: [facility];  Service: Orthopedics;  Laterality: Right;  . Nm myoview ltd  11/2013; 08/1014    a) 12/'14: EF 59%, Fixed Inferior-Inferoapical Defect c/w Inferior Scar; No Ischemia; b) 9/'15:  9/'15: EF 55% - persistent fixed inferior-inferoapical perfusion defect  - read as diaphragmatic attenuation, likely consistent with infarct/scar   . Left heart catheterization with coronary angiogram N/A 10/05/2013    Procedure: LEFT HEART CATHETERIZATION WITH CORONARY ANGIOGRAM;  Surgeon: Peter M Martinique, MD;  Location: [facility] CATH LAB;  Service: Cardiovascular;  RCA 100% - PCI,CxOM stable, LAD ~70% mid;  . Left heart catheterization with coronary angiogram N/A 06/08/2014    Procedure: LEFT HEART CATHETERIZATION WITH CORONARY ANGIOGRAM;  Surgeon: Troy Sine, MD;  Location: [facility] CATH LAB;  Service: Cardiovascular;  UA 6/'15: 75% ISR in RCA - PTCA, LAD lesion ~40%, CxOM stable.    . Percutaneous coronary intervention-balloon only  06/08/2014    Procedure: PERCUTANEOUS CORONARY INTERVENTION-BALLOON ONLY;  Surgeon: Troy Sine, MD;  Location: [facility] CATH LAB;  Service: Cardiovascular;;  . Coronary angioplasty with stent placement  09/2013; 05/2014     d) Promus DES 3.0 mm x 28 mm (3.2 mm)  to RCA with STEMI; residual 70% mid-distal LAD, OM 270-80%. Medical management; b) PTCA of 75 % ISR , LAD lesion noted as ~40%    reports that he quit smoking about 50 years ago. His smoking use included Cigarettes. He has never used smokeless tobacco. He reports that he does not drink alcohol or use illicit drugs. family history includes Diabetes in his sister; Heart disease in his sister. Allergies  Allergen Reactions  . Amlodipine Besylate Hives  . Hydralazine Itching  .  Lipitor [Atorvastatin] Itching  . Naproxen Diarrhea  . Oxycodone-Acetaminophen Other (See Comments)    REACTION: unspecified   Current Outpatient Prescriptions on File Prior to Visit  Medication Sig Dispense Refill  . brinzolamide (AZOPT) 1 % ophthalmic suspension Place 1 drop into both eyes every 12 (twelve) hours.     . clopidogrel (PLAVIX) 75 MG tablet TAKE 1 TABLET (75 MG TOTAL) BY MOUTH DAILY. 30 tablet 5  . isosorbide mononitrate (IMDUR) 30 MG 24 hr tablet Take 1 tablet (30 mg total) by mouth daily. 30 tablet 11  . losartan (COZAAR) 50 MG tablet Take 1 tablet (50 mg total) by mouth daily. 30 tablet 11  . metoprolol succinate (TOPROL-XL) 25 MG 24 hr tablet Take 1 tablet (25 mg total) by mouth daily. 90 tablet 3  . naphazoline-glycerin (CLEAR EYES) 0.012-0.2 % SOLN Place 1-2 drops into the left eye every 4 (four) hours as needed for irritation.    Marland Kitchen  nitroGLYCERIN (NITROSTAT) 0.4 MG SL tablet Place 1 tablet (0.4 mg total) under the tongue every 5 (five) minutes as needed. For chest pain. 25 tablet 6  . pantoprazole (PROTONIX) 40 MG tablet Take 1 tablet (40 mg total) by mouth daily. (Patient taking differently: Take 40 mg by mouth at bedtime. ) 30 tablet 11  . TRAVATAN Z 0.004 % SOLN ophthalmic solution Place 1 drop into both eyes at bedtime.      No current facility-administered medications on file prior to visit.   Review of Systems  Constitutional: Negative for increased diaphoresis, other activity, appetite or siginficant weight change other than noted HENT: Negative for worsening hearing loss, ear pain, facial swelling, mouth sores and neck stiffness.   Eyes: Negative for other worsening pain, redness or visual disturbance.  Respiratory: Negative for shortness of breath and wheezing  Cardiovascular: Negative for chest pain and palpitations.  Gastrointestinal: Negative for diarrhea, blood in stool, abdominal distention or other pain Genitourinary: Negative for hematuria, flank pain or  change in urine volume.  Musculoskeletal: Negative for myalgias or other joint complaints.  Skin: Negative for color change and wound or drainage.  Neurological: Negative for syncope and numbness. other than noted Hematological: Negative for adenopathy. or other swelling Psychiatric/Behavioral: Negative for hallucinations, SI, self-injury, decreased concentration or other worsening agitation.      Objective:   Physical Exam BP 118/60 mmHg  Pulse 65  Temp(Src) 98.5 F (36.9 C) (Oral)  Ht 5\' 7"  (1.702 m)  Wt 133 lb (60.328 kg)  BMI 20.83 kg/m2  SpO2 97% VS noted,  Constitutional: Pt is oriented to person, place, and time. Appears well-developed and well-nourished, in no significant distress Head: Normocephalic and atraumatic.  Right Ear: External ear normal.  Left Ear: External ear normal.  Nose: Nose normal.  Mouth/Throat: Oropharynx is clear and moist.  Eyes: Conjunctivae and EOM are normal. Pupils are equal, round, and reactive to light.  Neck: Normal range of motion. Neck supple. No JVD present. No tracheal deviation present or significant neck LA or mass Cardiovascular: Normal rate, regular rhythm, normal heart sounds and intact distal pulses.   Pulmonary/Chest: Effort normal and breath sounds without rales or wheezing  Abdominal: Soft. Bowel sounds are normal. NT. No HSM  Musculoskeletal: Normal range of motion. Exhibits no edema.  Lymphadenopathy:  Has no cervical adenopathy.  Neurological: Pt is alert and oriented to person, place, and time. Pt has normal reflexes. No cranial nerve deficit. Motor grossly intact Skin: Skin is warm and dry. No rash noted. left arm post distal to elbow is 3-4 cm area tender/red/swelling Psychiatric:  Has normal mood and affect. Behavior is normal.      Assessment & Plan:

## 2015-07-20 NOTE — Assessment & Plan Note (Signed)
stable overall by history and exam, recent data reviewed with pt, and pt to continue medical treatment as before,  to f/u any worsening symptoms or concerns Lab Results  Component Value Date   LDLCALC 76 08/26/2014

## 2015-07-20 NOTE — Assessment & Plan Note (Signed)
Mild worsening last 2 yrs, for f/u, consider nephrology referral Lab Results  Component Value Date   WBC 7.1 08/26/2014   HGB 11.9* 08/26/2014   HCT 35.5* 08/26/2014   PLT 135.0* 08/26/2014   GLUCOSE 97 08/26/2014   CHOL 134 08/26/2014   TRIG 110.0 08/26/2014   HDL 35.90* 08/26/2014   LDLCALC 76 08/26/2014   ALT 13 08/26/2014   AST 18 08/26/2014   NA 138 08/26/2014   K 5.4* 08/26/2014   CL 113* 08/26/2014   CREATININE 2.1* 08/26/2014   BUN 46* 08/26/2014   CO2 18* 08/26/2014   TSH 3.14 08/26/2014   PSA 5.66* 10/19/2009   INR 1.0 08/09/2014   HGBA1C 5.3 10/05/2013

## 2015-07-25 DIAGNOSIS — H4011X2 Primary open-angle glaucoma, moderate stage: Secondary | ICD-10-CM | POA: Diagnosis not present

## 2015-07-26 ENCOUNTER — Ambulatory Visit: Payer: 59 | Admitting: Cardiology

## 2015-07-26 DIAGNOSIS — R509 Fever, unspecified: Secondary | ICD-10-CM | POA: Diagnosis not present

## 2015-07-27 ENCOUNTER — Encounter (HOSPITAL_COMMUNITY): Payer: Self-pay | Admitting: Emergency Medicine

## 2015-07-27 ENCOUNTER — Emergency Department (HOSPITAL_COMMUNITY): Payer: Medicare Other

## 2015-07-27 ENCOUNTER — Emergency Department (HOSPITAL_COMMUNITY)
Admission: EM | Admit: 2015-07-27 | Discharge: 2015-07-27 | Disposition: A | Discharge status: Home / Self Care | Payer: Medicare Other | Source: Home / Self Care | Attending: Emergency Medicine | Admitting: Emergency Medicine

## 2015-07-27 DIAGNOSIS — I252 Old myocardial infarction: Secondary | ICD-10-CM

## 2015-07-27 DIAGNOSIS — I129 Hypertensive chronic kidney disease with stage 1 through stage 4 chronic kidney disease, or unspecified chronic kidney disease: Secondary | ICD-10-CM

## 2015-07-27 DIAGNOSIS — R030 Elevated blood-pressure reading, without diagnosis of hypertension: Secondary | ICD-10-CM | POA: Diagnosis not present

## 2015-07-27 DIAGNOSIS — Z9861 Coronary angioplasty status: Secondary | ICD-10-CM

## 2015-07-27 DIAGNOSIS — R112 Nausea with vomiting, unspecified: Secondary | ICD-10-CM | POA: Diagnosis not present

## 2015-07-27 DIAGNOSIS — D696 Thrombocytopenia, unspecified: Secondary | ICD-10-CM | POA: Diagnosis not present

## 2015-07-27 DIAGNOSIS — G8929 Other chronic pain: Secondary | ICD-10-CM

## 2015-07-27 DIAGNOSIS — Z8711 Personal history of peptic ulcer disease: Secondary | ICD-10-CM

## 2015-07-27 DIAGNOSIS — B349 Viral infection, unspecified: Secondary | ICD-10-CM

## 2015-07-27 DIAGNOSIS — R6883 Chills (without fever): Secondary | ICD-10-CM

## 2015-07-27 DIAGNOSIS — E039 Hypothyroidism, unspecified: Secondary | ICD-10-CM

## 2015-07-27 DIAGNOSIS — J449 Chronic obstructive pulmonary disease, unspecified: Secondary | ICD-10-CM | POA: Insufficient documentation

## 2015-07-27 DIAGNOSIS — Z862 Personal history of diseases of the blood and blood-forming organs and certain disorders involving the immune mechanism: Secondary | ICD-10-CM | POA: Insufficient documentation

## 2015-07-27 DIAGNOSIS — Z8739 Personal history of other diseases of the musculoskeletal system and connective tissue: Secondary | ICD-10-CM | POA: Insufficient documentation

## 2015-07-27 DIAGNOSIS — I2510 Atherosclerotic heart disease of native coronary artery without angina pectoris: Secondary | ICD-10-CM

## 2015-07-27 DIAGNOSIS — R509 Fever, unspecified: Secondary | ICD-10-CM | POA: Diagnosis not present

## 2015-07-27 DIAGNOSIS — E872 Acidosis: Secondary | ICD-10-CM | POA: Diagnosis not present

## 2015-07-27 DIAGNOSIS — Z792 Long term (current) use of antibiotics: Secondary | ICD-10-CM | POA: Insufficient documentation

## 2015-07-27 DIAGNOSIS — N183 Chronic kidney disease, stage 3 (moderate): Secondary | ICD-10-CM

## 2015-07-27 DIAGNOSIS — Z9889 Other specified postprocedural states: Secondary | ICD-10-CM | POA: Insufficient documentation

## 2015-07-27 DIAGNOSIS — Z86010 Personal history of colonic polyps: Secondary | ICD-10-CM

## 2015-07-27 DIAGNOSIS — Z79899 Other long term (current) drug therapy: Secondary | ICD-10-CM

## 2015-07-27 DIAGNOSIS — K5730 Diverticulosis of large intestine without perforation or abscess without bleeding: Secondary | ICD-10-CM | POA: Diagnosis not present

## 2015-07-27 DIAGNOSIS — E785 Hyperlipidemia, unspecified: Secondary | ICD-10-CM | POA: Insufficient documentation

## 2015-07-27 DIAGNOSIS — N179 Acute kidney failure, unspecified: Secondary | ICD-10-CM | POA: Diagnosis not present

## 2015-07-27 DIAGNOSIS — Z8673 Personal history of transient ischemic attack (TIA), and cerebral infarction without residual deficits: Secondary | ICD-10-CM

## 2015-07-27 DIAGNOSIS — K219 Gastro-esophageal reflux disease without esophagitis: Secondary | ICD-10-CM | POA: Insufficient documentation

## 2015-07-27 DIAGNOSIS — R799 Abnormal finding of blood chemistry, unspecified: Secondary | ICD-10-CM | POA: Diagnosis not present

## 2015-07-27 DIAGNOSIS — R531 Weakness: Secondary | ICD-10-CM | POA: Diagnosis not present

## 2015-07-27 DIAGNOSIS — Z7902 Long term (current) use of antithrombotics/antiplatelets: Secondary | ICD-10-CM

## 2015-07-27 DIAGNOSIS — K8020 Calculus of gallbladder without cholecystitis without obstruction: Secondary | ICD-10-CM | POA: Diagnosis not present

## 2015-07-27 DIAGNOSIS — R7881 Bacteremia: Secondary | ICD-10-CM | POA: Diagnosis not present

## 2015-07-27 DIAGNOSIS — I272 Other secondary pulmonary hypertension: Secondary | ICD-10-CM | POA: Diagnosis not present

## 2015-07-27 DIAGNOSIS — I700 Atherosclerosis of aorta: Secondary | ICD-10-CM | POA: Diagnosis not present

## 2015-07-27 DIAGNOSIS — K902 Blind loop syndrome, not elsewhere classified: Secondary | ICD-10-CM | POA: Diagnosis not present

## 2015-07-27 LAB — URINALYSIS, ROUTINE W REFLEX MICROSCOPIC
Bilirubin Urine: NEGATIVE
Glucose, UA: NEGATIVE mg/dL
Hgb urine dipstick: NEGATIVE
Ketones, ur: NEGATIVE mg/dL
LEUKOCYTES UA: NEGATIVE
Nitrite: NEGATIVE
Protein, ur: NEGATIVE mg/dL
SPECIFIC GRAVITY, URINE: 1.013 (ref 1.005–1.030)
Urobilinogen, UA: 0.2 mg/dL (ref 0.0–1.0)
pH: 5 (ref 5.0–8.0)

## 2015-07-27 LAB — I-STAT CG4 LACTIC ACID, ED: LACTIC ACID, VENOUS: 1.38 mmol/L (ref 0.5–2.0)

## 2015-07-27 LAB — CBC WITH DIFFERENTIAL/PLATELET
Basophils Absolute: 0 10*3/uL (ref 0.0–0.1)
Basophils Relative: 0 % (ref 0–1)
EOS PCT: 1 % (ref 0–5)
Eosinophils Absolute: 0 10*3/uL (ref 0.0–0.7)
HCT: 32.5 % — ABNORMAL LOW (ref 39.0–52.0)
HEMOGLOBIN: 10.6 g/dL — AB (ref 13.0–17.0)
Lymphocytes Relative: 5 % — ABNORMAL LOW (ref 12–46)
Lymphs Abs: 0.2 10*3/uL — ABNORMAL LOW (ref 0.7–4.0)
MCH: 28.7 pg (ref 26.0–34.0)
MCHC: 32.6 g/dL (ref 30.0–36.0)
MCV: 88.1 fL (ref 78.0–100.0)
Monocytes Absolute: 0.1 10*3/uL (ref 0.1–1.0)
Monocytes Relative: 2 % — ABNORMAL LOW (ref 3–12)
Neutro Abs: 3.8 10*3/uL (ref 1.7–7.7)
Neutrophils Relative %: 92 % — ABNORMAL HIGH (ref 43–77)
Platelets: 65 10*3/uL — ABNORMAL LOW (ref 150–400)
RBC: 3.69 MIL/uL — ABNORMAL LOW (ref 4.22–5.81)
RDW: 14.6 % (ref 11.5–15.5)
WBC: 4.1 10*3/uL (ref 4.0–10.5)

## 2015-07-27 LAB — HEPATIC FUNCTION PANEL
ALK PHOS: 79 U/L (ref 38–126)
ALT: 12 U/L — ABNORMAL LOW (ref 17–63)
AST: 24 U/L (ref 15–41)
Albumin: 3.4 g/dL — ABNORMAL LOW (ref 3.5–5.0)
Total Bilirubin: 0.6 mg/dL (ref 0.3–1.2)
Total Protein: 6.3 g/dL — ABNORMAL LOW (ref 6.5–8.1)

## 2015-07-27 LAB — BASIC METABOLIC PANEL
Anion gap: 8 (ref 5–15)
BUN: 56 mg/dL — ABNORMAL HIGH (ref 6–20)
CALCIUM: 8.9 mg/dL (ref 8.9–10.3)
CO2: 19 mmol/L — AB (ref 22–32)
CREATININE: 1.91 mg/dL — AB (ref 0.61–1.24)
Chloride: 114 mmol/L — ABNORMAL HIGH (ref 101–111)
GFR calc Af Amer: 36 mL/min — ABNORMAL LOW (ref >60)
GFR calc non Af Amer: 31 mL/min — ABNORMAL LOW (ref >60)
Glucose, Bld: 129 mg/dL — ABNORMAL HIGH (ref 65–99)
Potassium: 4.2 mmol/L (ref 3.5–5.1)
SODIUM: 141 mmol/L (ref 135–145)

## 2015-07-27 LAB — LIPASE, BLOOD: Lipase: 34 U/L (ref 22–51)

## 2015-07-27 MED ORDER — ONDANSETRON HCL 4 MG/2ML IJ SOLN
4.0000 mg | Freq: Once | INTRAMUSCULAR | Status: AC
  Administered 2015-07-27: 4 mg via INTRAVENOUS
  Filled 2015-07-27: qty 2

## 2015-07-27 MED ORDER — ACETAMINOPHEN 325 MG PO TABS
650.0000 mg | ORAL_TABLET | Freq: Once | ORAL | Status: AC
  Administered 2015-07-27: 650 mg via ORAL
  Filled 2015-07-27: qty 2

## 2015-07-27 MED ORDER — IOHEXOL 300 MG/ML  SOLN
50.0000 mL | Freq: Once | INTRAMUSCULAR | Status: AC | PRN
  Administered 2015-07-27: 50 mL via ORAL

## 2015-07-27 MED ORDER — ONDANSETRON 8 MG PO TBDP: 8.0000 mg | ORAL_TABLET | Freq: Three times a day (TID) | ORAL | Status: DC | PRN

## 2015-07-27 NOTE — ED Notes (Signed)
Pt to XR

## 2015-07-27 NOTE — ED Notes (Signed)
Pt BIB [facility], reports awakening at 2130 tonight with chills. Pt reports being recently dx with cellulitis of his L arm, placed on ABX. Pt noted to have skin tear to L forearm from reported shaking. Temp 99.6 at this time.

## 2015-07-27 NOTE — ED Notes (Signed)
MD at bedside speaking to pt and family.  

## 2015-07-27 NOTE — ED Notes (Signed)
Bed: [id number] Expected date:  Expected time:  Means of arrival:  Comments: EMS 81 yo male with fever/recent antibiotics for cellulitus in arms

## 2015-07-27 NOTE — Discharge Instructions (Signed)
Tylenol every 4-6 hours as needed for fever.  Takes Zofran as needed for nausea and vomiting.  Stick to a bland diet, increase your fluid intake.  Return to the emergency room for worsening condition or new concerning symptoms.  Follow-up with your Dr. for recheck in 2 days.    Fever, Adult A fever is a higher than normal body temperature. In an adult, an oral temperature around 98.6 F (37 C) is considered normal. A temperature of 100.4 F (38 C) or higher is generally considered a fever. Mild or moderate fevers generally have no long-term effects and often do not require treatment. Extreme fever (greater than or equal to 106 F or 41.1 C) can cause seizures. The sweating that may occur with repeated or prolonged fever may cause dehydration. Elderly people can develop confusion during a fever. A measured temperature can vary with:  Age.  Time of day.  Method of measurement (mouth, underarm, rectal, or ear). The fever is confirmed by taking a temperature with a thermometer. Temperatures can be taken different ways. Some methods are accurate and some are not.  An oral temperature is used most commonly. Electronic thermometers are fast and accurate.  An ear temperature will only be accurate if the thermometer is positioned as recommended by the manufacturer.  A rectal temperature is accurate and done for those adults who have a condition where an oral temperature cannot be taken.  An underarm (axillary) temperature is not accurate and not recommended. Fever is a symptom, not a disease.  CAUSES   Infections commonly cause fever.  Some noninfectious causes for fever include:  Some arthritis conditions.  Some thyroid or adrenal gland conditions.  Some immune system conditions.  Some types of cancer.  A medicine reaction.  High doses of certain street drugs such as methamphetamine.  Dehydration.  Exposure to high outside or room temperatures.  Occasionally, the source of a  fever cannot be determined. This is sometimes called a "fever of unknown origin" (FUO).  Some situations may lead to a temporary rise in body temperature that may go away on its own. Examples are:  Childbirth.  Surgery.  Intense exercise. HOME CARE INSTRUCTIONS   Take appropriate medicines for fever. Follow dosing instructions carefully. If you use acetaminophen to reduce the fever, be careful to avoid taking other medicines that also contain acetaminophen. Do not take aspirin for a fever if you are younger than age 23. There is an association with Reye's syndrome. Reye's syndrome is a rare but potentially deadly disease.  If an infection is present and antibiotics have been prescribed, take them as directed. Finish them even if you start to feel better.  Rest as needed.  Maintain an adequate fluid intake. To prevent dehydration during an illness with prolonged or recurrent fever, you may need to drink extra fluid.Drink enough fluids to keep your urine clear or pale yellow.  Sponging or bathing with room temperature water may help reduce body temperature. Do not use ice water or alcohol sponge baths.  Dress comfortably, but do not over-bundle. SEEK MEDICAL CARE IF:   You are unable to keep fluids down.  You develop vomiting or diarrhea.  You are not feeling at least partly better after 3 days.  You develop new symptoms or problems. SEEK IMMEDIATE MEDICAL CARE IF:   You have shortness of breath or trouble breathing.  You develop excessive weakness.  You are dizzy or you faint.  You are extremely thirsty or you are making little or  no urine.  You develop new pain that was not there before (such as in the head, neck, chest, back, or abdomen).  You have persistent vomiting and diarrhea for more than 1 to 2 days.  You develop a stiff neck or your eyes become sensitive to light.  You develop a skin rash.  You have a fever or persistent symptoms for more than 2 to 3  days.  You have a fever and your symptoms suddenly get worse. MAKE SURE YOU:   Understand these instructions.  Will watch your condition.  Will get help right away if you are not doing well or get worse. Document Released: 06/04/2001 Document Revised: 04/25/2014 Document Reviewed: 10/10/2011 [facility] Patient Information 2015 [place], [place]. This information is not intended to replace advice given to you by your health care provider. Make sure you discuss any questions you have with your health care provider.  Nausea and Vomiting Nausea is a sick feeling that often comes before throwing up (vomiting). Vomiting is a reflex where stomach contents come out of your mouth. Vomiting can cause severe loss of body fluids (dehydration). Children and elderly adults can become dehydrated quickly, especially if they also have diarrhea. Nausea and vomiting are symptoms of a condition or disease. It is important to find the cause of your symptoms. CAUSES   Direct irritation of the stomach lining. This irritation can result from increased acid production (gastroesophageal reflux disease), infection, food poisoning, taking certain medicines (such as nonsteroidal anti-inflammatory drugs), alcohol use, or tobacco use.  Signals from the brain.These signals could be caused by a headache, heat exposure, an inner ear disturbance, increased pressure in the brain from injury, infection, a tumor, or a concussion, pain, emotional stimulus, or metabolic problems.  An obstruction in the gastrointestinal tract (bowel obstruction).  Illnesses such as diabetes, hepatitis, gallbladder problems, appendicitis, kidney problems, cancer, sepsis, atypical symptoms of a heart attack, or eating disorders.  Medical treatments such as chemotherapy and radiation.  Receiving medicine that makes you sleep (general anesthetic) during surgery. DIAGNOSIS Your caregiver may ask for tests to be done if the problems do not improve after  a few days. Tests may also be done if symptoms are severe or if the reason for the nausea and vomiting is not clear. Tests may include:  Urine tests.  Blood tests.  Stool tests.  Cultures (to look for evidence of infection).  X-rays or other imaging studies. Test results can help your caregiver make decisions about treatment or the need for additional tests. TREATMENT You need to stay well hydrated. Drink frequently but in small amounts.You may wish to drink water, sports drinks, clear broth, or eat frozen ice pops or gelatin dessert to help stay hydrated.When you eat, eating slowly may help prevent nausea.There are also some antinausea medicines that may help prevent nausea. HOME CARE INSTRUCTIONS   Take all medicine as directed by your caregiver.  If you do not have an appetite, do not force yourself to eat. However, you must continue to drink fluids.  If you have an appetite, eat a normal diet unless your caregiver tells you differently.  Eat a variety of complex carbohydrates (rice, wheat, potatoes, bread), lean meats, yogurt, fruits, and vegetables.  Avoid high-fat foods because they are more difficult to digest.  Drink enough water and fluids to keep your urine clear or pale yellow.  If you are dehydrated, ask your caregiver for specific rehydration instructions. Signs of dehydration may include:  Severe thirst.  Dry lips and  mouth.  Dizziness.  Dark urine.  Decreasing urine frequency and amount.  Confusion.  Rapid breathing or pulse. SEEK IMMEDIATE MEDICAL CARE IF:   You have blood or brown flecks (like coffee grounds) in your vomit.  You have black or bloody stools.  You have a severe headache or stiff neck.  You are confused.  You have severe abdominal pain.  You have chest pain or trouble breathing.  You do not urinate at least once every 8 hours.  You develop cold or clammy skin.  You continue to vomit for longer than 24 to 48 hours.  You  have a fever. MAKE SURE YOU:   Understand these instructions.  Will watch your condition.  Will get help right away if you are not doing well or get worse. Document Released: 12/09/2005 Document Revised: 03/02/2012 Document Reviewed: 05/08/2011 [facility] Patient Information 2015 [place], [place]. This information is not intended to replace advice given to you by your health care provider. Make sure you discuss any questions you have with your health care provider.  Viral Infections A viral infection can be caused by different types of viruses.Most viral infections are not serious and resolve on their own. However, some infections may cause severe symptoms and may lead to further complications. SYMPTOMS Viruses can frequently cause:  Minor sore throat.  Aches and pains.  Headaches.  Runny nose.  Different types of rashes.  Watery eyes.  Tiredness.  Cough.  Loss of appetite.  Gastrointestinal infections, resulting in nausea, vomiting, and diarrhea. These symptoms do not respond to antibiotics because the infection is not caused by bacteria. However, you might catch a bacterial infection following the viral infection. This is sometimes called a "superinfection." Symptoms of such a bacterial infection may include:  Worsening sore throat with pus and difficulty swallowing.  Swollen neck glands.  Chills and a high or persistent fever.  Severe headache.  Tenderness over the sinuses.  Persistent overall ill feeling (malaise), muscle aches, and tiredness (fatigue).  Persistent cough.  Yellow, green, or brown mucus production with coughing. HOME CARE INSTRUCTIONS   Only take over-the-counter or prescription medicines for pain, discomfort, diarrhea, or fever as directed by your caregiver.  Drink enough water and fluids to keep your urine clear or pale yellow. Sports drinks can provide valuable electrolytes, sugars, and hydration.  Get plenty of rest and maintain proper  nutrition. Soups and broths with crackers or rice are fine. SEEK IMMEDIATE MEDICAL CARE IF:   You have severe headaches, shortness of breath, chest pain, neck pain, or an unusual rash.  You have uncontrolled vomiting, diarrhea, or you are unable to keep down fluids.  You or your child has an oral temperature above 102 F (38.9 C), not controlled by medicine.  Your baby is older than 3 months with a rectal temperature of 102 F (38.9 C) or higher.  Your baby is 52 months old or younger with a rectal temperature of 100.4 F (38 C) or higher. MAKE SURE YOU:   Understand these instructions.  Will watch your condition.  Will get help right away if you are not doing well or get worse. Document Released: 09/18/2005 Document Revised: 03/02/2012 Document Reviewed: 04/15/2011 [facility] Patient Information 2015 [place], [place]. This information is not intended to replace advice given to you by your health care provider. Make sure you discuss any questions you have with your health care provider.

## 2015-07-27 NOTE — ED Provider Notes (Signed)
CSN: [id number]   Arrival date & time 07/27/15 0007  History  This chart was scribed for  Linton Flemings, MD by Altamease Oiler, ED Scribe. This patient was seen in room WA15/WA15 and the patient's care was started at 12:33 AM.  Chief Complaint  Patient presents with  . Chills    HPI The history is provided by the patient. No language interpreter was used.   Nicholas Porter is a 81 y.o. male who presents to the Emergency Department complaining of chills and intermittent "jerking" with onset tonight. Pt states that it is 75 degrees at his house and he is wearing long john's. Associated symptoms include nausea and vomiting with onset 1 hour ago. No known fever, abdominal pain, dysuria, or increased urinary frequency.  Pt has a skin tear to the left forearm and is on doxycycline for cellulitis of the left arm.   Past Medical History  Diagnosis Date  . CAD in native artery 2001; 2011; 2014; 05/2014    a) 2001 & 2011 -- Med Rx for: 60-70% ostial Cx-OM lesion, 40-50% lesions in the LAD and RC; b) Persantine Myoview 2012: No ischemia or infarction, EF 66% c) 10/'14: RCA 100% - PCI,CxOM stable, LAD ~70% mid; d) UA 6/'15: 75% ISR in RCA - PTCA, LAD lesion ~40%, CxOM stable.    . ST elevation myocardial infarction (STEMI) of inferior wall, emergent PCI with Promus DES to RCA 10/05/2013    100% mRCA occlusion; residual 80% OM2, ~70% LAD --> PCI to RCA  . CAD S/P percutaneous coronary angioplasty 10/05/2013; 05/2014    a) 10/'14: PCI to RCA - Promus Premier DES 3.0 mm x 28 mm to mRCA (~3.19 mm);; b) 6/'15: Cutting Balloon PTCA of 75 % RCA ISR  . Moderate aortic insufficiency February 2012    Echocardiogram: EF > 55%; impaired relaxation. Mild/moderate MAC with mild MR; Moderate Aortic Regurgitation and mild sclerosis/ NO stenosis; mild - mod Pulm HTN (40-44mmHg);; Follow-up 05/2013: EF 65-70%, PAP ~31 mmH, otherwise no change  . Mild aortic stenosis  10/05/2013    Echo post STEMI: EF 55-60%. No regional W. MA.  Mild aortic stenosis with mild regurgitation. MAC. Moderate pulmonary hypertension, 58 mmHg.  . Carotid artery stenosis  December 2012    s/p L CEA  . Hypertension, benign   . Hyperlipidemia LDL goal <70   . CKD (chronic kidney disease), stage III   . COPD (chronic obstructive pulmonary disease)  12/07/2011  . Thrombocytopenia, acquired 06/30/2007  . Anemia, B12 deficiency 11/04/2008  . Personal history of peptic ulcer disease 06/30/2007  . GERD (gastroesophageal reflux disease) 06/30/2007  . Irritable bowel syndrome (IBS)  12/08/2008  . Gallstones  12/07/2011  . Hernia, hiatal  08/25/2008  . 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 History of colonic polyps 08/25/2008  . Diverticulosis of colon 12/08/2008  . Osteoarthritis 05/22/2008; 06/30/2007     with chronic upper and lumbar back pain as well as neck pain.  . Chronic back pain 12/06/2011  . Neck pain, chronic 12/06/2011  . Osteopenia 10/18/2008  . Hypothyroidism 10/18/2008  . Finger amputation, traumatic   . Personal history of TIA (transient ischemic attack) 2007  . GERD (gastroesophageal reflux disease) 01/24/2015    Past Surgical History  Procedure Laterality Date  . Foot surgery      left  . Carpal tunnel release    . Hernia repair    . Total knee arthroplasty    . Carotid endarterectomy Left 2007    Dr.Hayes  .  Amputation      left index finger -tramatic  . Vagotomy      partial gastrectomy  . Partial gastrectomy    . Corneal transplant    . Rotator cuff repair      left  . Spine surgery  1985    cervical laminectomy  . Cardiac catheterization  November 2011    40% mid LAD, 50% proximal RCA, 30-40% distal RCA (DOMINANT RCA with wraparound PDA & 2 large PLBs), 60-70% ostial OM1. No change from 2001. Medical therapy  . Shoulder arthroscopy with rotator cuff repair and subacromial decompression Right 07/01/2013    Procedure: RIGHT SHOULDER ARTHROSCOPY WITH  SUBACROMIAL DECOMPRESSION/DISTAL CLAVICLE RESECTION/ROTATOR CUFF  REPAIR ;  Surgeon: Marin Shutter, MD;  Location: [place];  Service: Orthopedics;  Laterality: Right;  . Nm myoview ltd  11/2013; 08/1014    a) 12/'14: EF 59%, Fixed Inferior-Inferoapical Defect c/w Inferior Scar; No Ischemia; b) 9/'15:  9/'15: EF 55% - persistent fixed inferior-inferoapical perfusion defect  - read as diaphragmatic attenuation, likely consistent with infarct/scar   . Left heart catheterization with coronary angiogram N/A 10/05/2013    Procedure: LEFT HEART CATHETERIZATION WITH CORONARY ANGIOGRAM;  Surgeon: Peter M Martinique, MD;  Location: [facility] CATH LAB;  Service: Cardiovascular;  RCA 100% - PCI,CxOM stable, LAD ~70% mid;  . Left heart catheterization with coronary angiogram N/A 06/08/2014    Procedure: LEFT HEART CATHETERIZATION WITH CORONARY ANGIOGRAM;  Surgeon: Troy Sine, MD;  Location: [facility] CATH LAB;  Service: Cardiovascular;  UA 6/'15: 75% ISR in RCA - PTCA, LAD lesion ~40%, CxOM stable.    . Percutaneous coronary intervention-balloon only  06/08/2014    Procedure: PERCUTANEOUS CORONARY INTERVENTION-BALLOON ONLY;  Surgeon: Troy Sine, MD;  Location: [facility] CATH LAB;  Service: Cardiovascular;;  . Coronary angioplasty with stent placement  09/2013; 05/2014     d) Promus DES 3.0 mm x 28 mm (3.2 mm)  to RCA with STEMI; residual 70% mid-distal LAD, OM 270-80%. Medical management; b) PTCA of 75 % ISR , LAD lesion noted as ~40%    Family History  Problem Relation Age of Onset  . Heart disease Sister   . Diabetes Sister     History  Substance Use Topics  . Smoking status: Former Smoker    Types: Cigarettes    Quit date: 12/23/1964  . Smokeless tobacco: Never Used  . Alcohol Use: No     Review of Systems  Constitutional: Positive for chills. Negative for fever.  Gastrointestinal: Positive for nausea and vomiting. Negative for abdominal pain.  Genitourinary: Negative for dysuria, frequency and difficulty urinating.  Neurological: Positive for tremors.     Home Medications    Prior to Admission medications   Medication Sig Start Date End Date Taking? Authorizing Provider  brinzolamide (AZOPT) 1 % ophthalmic suspension Place 1 drop into both eyes every 12 (twelve) hours.     Historical Provider, MD  clopidogrel (PLAVIX) 75 MG tablet TAKE 1 TABLET (75 MG TOTAL) BY MOUTH DAILY. 06/15/15   Leonie Man, MD  doxycycline (VIBRA-TABS) 100 MG tablet Take 1 tablet (100 mg total) by mouth 2 (two) times daily. 07/20/15   Biagio Borg, MD  isosorbide mononitrate (IMDUR) 30 MG 24 hr tablet Take 1 tablet (30 mg total) by mouth daily. 09/05/14   Leonie Man, MD  losartan (COZAAR) 50 MG tablet Take 1 tablet (50 mg total) by mouth daily. 12/13/14   Leonie Man, MD  metoprolol succinate (TOPROL-XL) 25  MG 24 hr tablet Take 1 tablet (25 mg total) by mouth daily. 06/13/15   Leonie Man, MD  naphazoline-glycerin (CLEAR EYES) 0.012-0.2 % SOLN Place 1-2 drops into the left eye every 4 (four) hours as needed for irritation.    Historical Provider, MD  nitroGLYCERIN (NITROSTAT) 0.4 MG SL tablet Place 1 tablet (0.4 mg total) under the tongue every 5 (five) minutes as needed. For chest pain. 06/02/14   Leonie Man, MD  pantoprazole (PROTONIX) 40 MG tablet Take 1 tablet (40 mg total) by mouth daily. Patient taking differently: Take 40 mg by mouth at bedtime.  01/24/15   Biagio Borg, MD  TRAVATAN Z 0.004 % SOLN ophthalmic solution Place 1 drop into both eyes at bedtime.  04/20/13   Historical Provider, MD    Allergies  Amlodipine besylate; Hydralazine; Lipitor; Naproxen; and Oxycodone-acetaminophen  Triage Vitals: BP 184/63 mmHg  Pulse 98  Temp(Src) 99.6 F (37.6 C) (Oral)  Resp 16  Ht 5\' 7"  (1.702 m)  Wt 133 lb (60.328 kg)  BMI 20.83 kg/m2  SpO2 97%  Physical Exam  Constitutional: He appears well-developed and well-nourished.  Patient is mildly confused, reports that he is 76, and tends to repeat answers to questions  HENT:  Head: Normocephalic and atraumatic.  Nose:  Nose normal.  Mouth/Throat: Oropharynx is clear and moist.  Eyes: Conjunctivae and EOM are normal. Pupils are equal, round, and reactive to light.  Neck: Normal range of motion. Neck supple. No JVD present. No tracheal deviation present. No thyromegaly present.  Cardiovascular: Normal rate, regular rhythm, normal heart sounds and intact distal pulses.  Exam reveals no gallop and no friction rub.   No murmur heard. Pulmonary/Chest: Effort normal and breath sounds normal. No stridor. No respiratory distress. He has no wheezes. He has no rales. He exhibits no tenderness.  Abdominal: Soft. Bowel sounds are normal. He exhibits no distension and no mass. There is no tenderness. There is no rebound and no guarding.  Musculoskeletal: Normal range of motion. He exhibits no edema or tenderness.  Lymphadenopathy:    He has no cervical adenopathy.  Neurological: He is alert. He displays normal reflexes. No cranial nerve deficit. He exhibits normal muscle tone. Coordination normal.  Skin: Skin is warm and dry. No rash noted. No erythema. No pallor.  Psychiatric: He has a normal mood and affect. His behavior is normal. Judgment and thought content normal.  Nursing note and vitals reviewed.   ED Course  Procedures   DIAGNOSTIC STUDIES: Oxygen Saturation is 97% on RA, normal by my interpretation.    COORDINATION OF CARE: 12:40 AM Discussed treatment plan which includes lab work and Zofran with pt at bedside and pt agreed to plan.  Labs Review-  Labs Reviewed  CBC WITH DIFFERENTIAL/PLATELET - Abnormal; Notable for the following:    RBC 3.69 (*)    Hemoglobin 10.6 (*)    HCT 32.5 (*)    Platelets 65 (*)    Neutrophils Relative % 92 (*)    Lymphocytes Relative 5 (*)    Lymphs Abs 0.2 (*)    Monocytes Relative 2 (*)    All other components within normal limits  BASIC METABOLIC PANEL - Abnormal; Notable for the following:    Chloride 114 (*)    CO2 19 (*)    Glucose, Bld 129 (*)    BUN 56 (*)     Creatinine, Ser 1.91 (*)    GFR calc non Af Amer 31 (*)  GFR calc Af Amer 36 (*)    All other components within normal limits  HEPATIC FUNCTION PANEL - Abnormal; Notable for the following:    Total Protein 6.3 (*)    Albumin 3.4 (*)    ALT 12 (*)    Bilirubin, Direct <0.1 (*)    All other components within normal limits  CULTURE, BLOOD (ROUTINE X 2)  CULTURE, BLOOD (ROUTINE X 2)  URINALYSIS, ROUTINE W REFLEX MICROSCOPIC (NOT AT [facility])  LIPASE, BLOOD  I-STAT CG4 LACTIC ACID, ED    Imaging Review Ct Abdomen Pelvis Wo Contrast  07/27/2015   CLINICAL DATA:  Nausea vomiting and chills.  EXAM: CT ABDOMEN AND PELVIS WITHOUT CONTRAST  TECHNIQUE: Multidetector CT imaging of the abdomen and pelvis was performed following the standard protocol without IV contrast.  COMPARISON:  11/27/2008  FINDINGS: Lower chest:  No significant abnormality  Hepatobiliary: Cholelithiasis. Unremarkable unenhanced appearances of the liver. Bile ducts unremarkable.  Pancreas: Unremarkable  Spleen: Under  Adrenals/Urinary Tract: The adrenals and kidneys are remarkable only for mild renal atrophy bilaterally. There is no urinary calculus. There is no hydronephrosis or ureteral dilatation. Urinary bladder is unremarkable.  Stomach/Bowel: Surgical clips around the stomach. Otherwise unremarkable appearances of the stomach and small bowel. Appendix is normal. There is extensive colonic diverticulosis. There is no evidence of diverticulitis or other acute inflammatory process involving the bowel. There is no bowel obstruction. There is no extraluminal air.  Vascular/Lymphatic: The abdominal aorta is normal in caliber. There is extent atherosclerotic calcification. There is no adenopathy in the abdomen or pelvis.  Reproductive: Moderate prostate enlargement with elevation of the bladder base.  Other: No ascites. No acute findings are evident in the abdomen or pelvis.  Musculoskeletal: Moderately severe degenerative disc changes at  L5-S1. There also is moderately severe facet arthritis at L4-5 and L5-S1 on the right. There is grade 1 spondylolisthesis at L5-S1.  IMPRESSION: 1. No acute findings are evident in the abdomen or pelvis. 2. Cholelithiasis 3. Diverticulosis 4. Severe lumbar degenerative disc and facet changes.   Electronically Signed   By: Andreas Newport M.D.   On: 07/27/2015 04:29   Dg Chest 2 View  07/27/2015   CLINICAL DATA:  Acute onset of fever, chills, vomiting and weakness. Initial encounter.  EXAM: CHEST  2 VIEW  COMPARISON:  Chest radiograph performed 06/07/2014  FINDINGS: The lungs are well-aerated. Minimal bilateral atelectasis is noted. There is no evidence of pleural effusion or pneumothorax.  The heart is normal in size; the mediastinal contour is within normal limits. No acute osseous abnormalities are seen. Scattered clips are seen about the gastroesophageal junction.  IMPRESSION: Minimal bilateral atelectasis noted; lungs otherwise clear.   Electronically Signed   By: Garald Balding M.D.   On: 07/27/2015 01:24    EKG Interpretation  Date/Time:    Ventricular Rate:    PR Interval:    QRS Duration:   QT Interval:    QTC Calculation:   R Axis:     Text Interpretation:         MDM   Final diagnoses:  Fever  Chills  Nausea & vomiting  Viral syndrome     I personally performed the services described in this documentation, which was scribed in my presence. The recorded information has been reviewed and is accurate.  81 year old male woke up tonight with chills, nausea, vomiting.  Vomiting bilious material here in the emergency department.  Denies any abdominal pain.  Patient currently on doxycycline for recent  left arm cellulitis, has new skin tear to left arm with bleeding controlled.  Plan for labs, chest x-ray and probable CT abdomen pelvis given vomiting and chills.   Linton Flemings, MD 07/27/15 [phone]

## 2015-07-27 NOTE — ED Notes (Signed)
Pt to CT at this time.

## 2015-07-27 NOTE — ED Notes (Signed)
MD at bedside. 

## 2015-07-29 ENCOUNTER — Encounter (HOSPITAL_COMMUNITY): Payer: Self-pay

## 2015-07-29 ENCOUNTER — Inpatient Hospital Stay (HOSPITAL_COMMUNITY)
Admission: EM | Admit: 2015-07-29 | Discharge: 2015-08-03 | DRG: 872 | Disposition: A | Discharge status: Home / Self Care | Payer: Medicare Other | Attending: Internal Medicine | Admitting: Internal Medicine

## 2015-07-29 ENCOUNTER — Emergency Department (HOSPITAL_COMMUNITY): Payer: Medicare Other

## 2015-07-29 ENCOUNTER — Telehealth (HOSPITAL_COMMUNITY): Payer: Self-pay | Admitting: Emergency Medicine

## 2015-07-29 DIAGNOSIS — R51 Headache: Secondary | ICD-10-CM | POA: Diagnosis not present

## 2015-07-29 DIAGNOSIS — Z888 Allergy status to other drugs, medicaments and biological substances status: Secondary | ICD-10-CM

## 2015-07-29 DIAGNOSIS — E538 Deficiency of other specified B group vitamins: Secondary | ICD-10-CM | POA: Diagnosis present

## 2015-07-29 DIAGNOSIS — D649 Anemia, unspecified: Secondary | ICD-10-CM | POA: Diagnosis present

## 2015-07-29 DIAGNOSIS — R531 Weakness: Secondary | ICD-10-CM | POA: Diagnosis not present

## 2015-07-29 DIAGNOSIS — K5730 Diverticulosis of large intestine without perforation or abscess without bleeding: Secondary | ICD-10-CM | POA: Diagnosis present

## 2015-07-29 DIAGNOSIS — Z8673 Personal history of transient ischemic attack (TIA), and cerebral infarction without residual deficits: Secondary | ICD-10-CM | POA: Diagnosis not present

## 2015-07-29 DIAGNOSIS — M1990 Unspecified osteoarthritis, unspecified site: Secondary | ICD-10-CM | POA: Diagnosis present

## 2015-07-29 DIAGNOSIS — R799 Abnormal finding of blood chemistry, unspecified: Secondary | ICD-10-CM | POA: Diagnosis not present

## 2015-07-29 DIAGNOSIS — K589 Irritable bowel syndrome without diarrhea: Secondary | ICD-10-CM | POA: Diagnosis present

## 2015-07-29 DIAGNOSIS — Z8249 Family history of ischemic heart disease and other diseases of the circulatory system: Secondary | ICD-10-CM

## 2015-07-29 DIAGNOSIS — I350 Nonrheumatic aortic (valve) stenosis: Secondary | ICD-10-CM | POA: Diagnosis present

## 2015-07-29 DIAGNOSIS — I129 Hypertensive chronic kidney disease with stage 1 through stage 4 chronic kidney disease, or unspecified chronic kidney disease: Secondary | ICD-10-CM | POA: Diagnosis present

## 2015-07-29 DIAGNOSIS — Z903 Acquired absence of stomach [part of]: Secondary | ICD-10-CM | POA: Diagnosis present

## 2015-07-29 DIAGNOSIS — Z885 Allergy status to narcotic agent status: Secondary | ICD-10-CM

## 2015-07-29 DIAGNOSIS — I252 Old myocardial infarction: Secondary | ICD-10-CM | POA: Diagnosis not present

## 2015-07-29 DIAGNOSIS — E785 Hyperlipidemia, unspecified: Secondary | ICD-10-CM | POA: Diagnosis present

## 2015-07-29 DIAGNOSIS — Z79899 Other long term (current) drug therapy: Secondary | ICD-10-CM | POA: Diagnosis not present

## 2015-07-29 DIAGNOSIS — M542 Cervicalgia: Secondary | ICD-10-CM | POA: Diagnosis present

## 2015-07-29 DIAGNOSIS — Z66 Do not resuscitate: Secondary | ICD-10-CM | POA: Diagnosis present

## 2015-07-29 DIAGNOSIS — M8580 Other specified disorders of bone density and structure, unspecified site: Secondary | ICD-10-CM | POA: Diagnosis present

## 2015-07-29 DIAGNOSIS — Z886 Allergy status to analgesic agent status: Secondary | ICD-10-CM

## 2015-07-29 DIAGNOSIS — E872 Acidosis: Secondary | ICD-10-CM | POA: Diagnosis present

## 2015-07-29 DIAGNOSIS — Z955 Presence of coronary angioplasty implant and graft: Secondary | ICD-10-CM | POA: Diagnosis not present

## 2015-07-29 DIAGNOSIS — Z8711 Personal history of peptic ulcer disease: Secondary | ICD-10-CM

## 2015-07-29 DIAGNOSIS — N12 Tubulo-interstitial nephritis, not specified as acute or chronic: Secondary | ICD-10-CM | POA: Diagnosis present

## 2015-07-29 DIAGNOSIS — D696 Thrombocytopenia, unspecified: Secondary | ICD-10-CM | POA: Diagnosis not present

## 2015-07-29 DIAGNOSIS — N179 Acute kidney failure, unspecified: Secondary | ICD-10-CM | POA: Diagnosis present

## 2015-07-29 DIAGNOSIS — I700 Atherosclerosis of aorta: Secondary | ICD-10-CM | POA: Diagnosis not present

## 2015-07-29 DIAGNOSIS — G8929 Other chronic pain: Secondary | ICD-10-CM | POA: Diagnosis present

## 2015-07-29 DIAGNOSIS — E039 Hypothyroidism, unspecified: Secondary | ICD-10-CM | POA: Diagnosis present

## 2015-07-29 DIAGNOSIS — I351 Nonrheumatic aortic (valve) insufficiency: Secondary | ICD-10-CM | POA: Diagnosis present

## 2015-07-29 DIAGNOSIS — Z833 Family history of diabetes mellitus: Secondary | ICD-10-CM | POA: Diagnosis not present

## 2015-07-29 DIAGNOSIS — Z86010 Personal history of colonic polyps: Secondary | ICD-10-CM | POA: Diagnosis not present

## 2015-07-29 DIAGNOSIS — B9620 Unspecified Escherichia coli [E. coli] as the cause of diseases classified elsewhere: Secondary | ICD-10-CM | POA: Diagnosis present

## 2015-07-29 DIAGNOSIS — I6529 Occlusion and stenosis of unspecified carotid artery: Secondary | ICD-10-CM | POA: Diagnosis present

## 2015-07-29 DIAGNOSIS — Z87891 Personal history of nicotine dependence: Secondary | ICD-10-CM

## 2015-07-29 DIAGNOSIS — M545 Low back pain: Secondary | ICD-10-CM | POA: Diagnosis present

## 2015-07-29 DIAGNOSIS — I2510 Atherosclerotic heart disease of native coronary artery without angina pectoris: Secondary | ICD-10-CM | POA: Diagnosis not present

## 2015-07-29 DIAGNOSIS — R7881 Bacteremia: Principal | ICD-10-CM

## 2015-07-29 DIAGNOSIS — J449 Chronic obstructive pulmonary disease, unspecified: Secondary | ICD-10-CM | POA: Diagnosis present

## 2015-07-29 DIAGNOSIS — R42 Dizziness and giddiness: Secondary | ICD-10-CM | POA: Diagnosis not present

## 2015-07-29 DIAGNOSIS — I272 Other secondary pulmonary hypertension: Secondary | ICD-10-CM | POA: Diagnosis present

## 2015-07-29 DIAGNOSIS — N183 Chronic kidney disease, stage 3 (moderate): Secondary | ICD-10-CM | POA: Diagnosis present

## 2015-07-29 DIAGNOSIS — R509 Fever, unspecified: Secondary | ICD-10-CM | POA: Diagnosis not present

## 2015-07-29 DIAGNOSIS — K902 Blind loop syndrome, not elsewhere classified: Secondary | ICD-10-CM | POA: Diagnosis present

## 2015-07-29 DIAGNOSIS — Z7902 Long term (current) use of antithrombotics/antiplatelets: Secondary | ICD-10-CM | POA: Diagnosis not present

## 2015-07-29 DIAGNOSIS — K219 Gastro-esophageal reflux disease without esophagitis: Secondary | ICD-10-CM | POA: Diagnosis present

## 2015-07-29 DIAGNOSIS — R030 Elevated blood-pressure reading, without diagnosis of hypertension: Secondary | ICD-10-CM | POA: Diagnosis not present

## 2015-07-29 DIAGNOSIS — I10 Essential (primary) hypertension: Secondary | ICD-10-CM | POA: Diagnosis not present

## 2015-07-29 DIAGNOSIS — K8020 Calculus of gallbladder without cholecystitis without obstruction: Secondary | ICD-10-CM | POA: Diagnosis present

## 2015-07-29 LAB — LACTIC ACID, PLASMA
LACTIC ACID, VENOUS: 0.8 mmol/L (ref 0.5–2.0)
Lactic Acid, Venous: 1.6 mmol/L (ref 0.5–2.0)

## 2015-07-29 LAB — HEPATIC FUNCTION PANEL
ALT: 13 U/L — ABNORMAL LOW (ref 17–63)
AST: 24 U/L (ref 15–41)
Albumin: 3.9 g/dL (ref 3.5–5.0)
Alkaline Phosphatase: 80 U/L (ref 38–126)
Bilirubin, Direct: 0.2 mg/dL (ref 0.1–0.5)
Indirect Bilirubin: 0.6 mg/dL (ref 0.3–0.9)
TOTAL PROTEIN: 7 g/dL (ref 6.5–8.1)
Total Bilirubin: 0.8 mg/dL (ref 0.3–1.2)

## 2015-07-29 LAB — BASIC METABOLIC PANEL
Anion gap: 7 (ref 5–15)
BUN: 48 mg/dL — ABNORMAL HIGH (ref 6–20)
CALCIUM: 9.3 mg/dL (ref 8.9–10.3)
CHLORIDE: 110 mmol/L (ref 101–111)
CO2: 22 mmol/L (ref 22–32)
Creatinine, Ser: 2.03 mg/dL — ABNORMAL HIGH (ref 0.61–1.24)
GFR calc non Af Amer: 29 mL/min — ABNORMAL LOW (ref >60)
GFR, EST AFRICAN AMERICAN: 33 mL/min — AB (ref >60)
Glucose, Bld: 104 mg/dL — ABNORMAL HIGH (ref 65–99)
POTASSIUM: 5 mmol/L (ref 3.5–5.1)
SODIUM: 139 mmol/L (ref 135–145)

## 2015-07-29 LAB — URINALYSIS, ROUTINE W REFLEX MICROSCOPIC
Bilirubin Urine: NEGATIVE
GLUCOSE, UA: NEGATIVE mg/dL
Hgb urine dipstick: NEGATIVE
Ketones, ur: NEGATIVE mg/dL
Leukocytes, UA: NEGATIVE
Nitrite: NEGATIVE
PH: 5.5 (ref 5.0–8.0)
Protein, ur: NEGATIVE mg/dL
Specific Gravity, Urine: 1.014 (ref 1.005–1.030)
UROBILINOGEN UA: 0.2 mg/dL (ref 0.0–1.0)

## 2015-07-29 LAB — CBC WITH DIFFERENTIAL/PLATELET
BASOS PCT: 0 % (ref 0–1)
Basophils Absolute: 0 10*3/uL (ref 0.0–0.1)
Eosinophils Absolute: 0.1 10*3/uL (ref 0.0–0.7)
Eosinophils Relative: 2 % (ref 0–5)
HCT: 35.9 % — ABNORMAL LOW (ref 39.0–52.0)
HEMOGLOBIN: 11.8 g/dL — AB (ref 13.0–17.0)
LYMPHS ABS: 1.2 10*3/uL (ref 0.7–4.0)
Lymphocytes Relative: 17 % (ref 12–46)
MCH: 28.7 pg (ref 26.0–34.0)
MCHC: 32.9 g/dL (ref 30.0–36.0)
MCV: 87.3 fL (ref 78.0–100.0)
MONOS PCT: 11 % (ref 3–12)
Monocytes Absolute: 0.8 10*3/uL (ref 0.1–1.0)
NEUTROS ABS: 5.2 10*3/uL (ref 1.7–7.7)
NEUTROS PCT: 71 % (ref 43–77)
PLATELETS: 85 10*3/uL — AB (ref 150–400)
RBC: 4.11 MIL/uL — AB (ref 4.22–5.81)
RDW: 14.7 % (ref 11.5–15.5)
WBC: 7.3 10*3/uL (ref 4.0–10.5)

## 2015-07-29 LAB — CBG MONITORING, ED: Glucose-Capillary: 87 mg/dL (ref 65–99)

## 2015-07-29 LAB — TROPONIN I: Troponin I: 0.04 ng/mL — ABNORMAL HIGH (ref <0.031)

## 2015-07-29 MED ORDER — CETYLPYRIDINIUM CHLORIDE 0.05 % MT LIQD
7.0000 mL | Freq: Two times a day (BID) | OROMUCOSAL | Status: DC
  Administered 2015-07-30 – 2015-08-03 (×9): 7 mL via OROMUCOSAL

## 2015-07-29 MED ORDER — PIPERACILLIN-TAZOBACTAM 3.375 G IVPB 30 MIN
3.3750 g | Freq: Once | INTRAVENOUS | Status: AC
  Administered 2015-07-29: 3.375 g via INTRAVENOUS
  Filled 2015-07-29: qty 50

## 2015-07-29 MED ORDER — METOPROLOL SUCCINATE ER 25 MG PO TB24
25.0000 mg | ORAL_TABLET | Freq: Every day | ORAL | Status: DC
  Administered 2015-07-30 – 2015-08-03 (×5): 25 mg via ORAL
  Filled 2015-07-29 (×5): qty 1

## 2015-07-29 MED ORDER — SODIUM CHLORIDE 0.9 % IV SOLN: INTRAVENOUS | Status: AC

## 2015-07-29 MED ORDER — BRINZOLAMIDE 1 % OP SUSP
1.0000 [drp] | Freq: Two times a day (BID) | OPHTHALMIC | Status: DC
Start: 2015-07-29 — End: 2015-08-03
  Administered 2015-07-29 – 2015-08-03 (×10): 1 [drp] via OPHTHALMIC
  Filled 2015-07-29: qty 10

## 2015-07-29 MED ORDER — PANTOPRAZOLE SODIUM 40 MG PO TBEC
40.0000 mg | DELAYED_RELEASE_TABLET | Freq: Every day | ORAL | Status: DC
  Administered 2015-07-30 – 2015-08-03 (×5): 40 mg via ORAL
  Filled 2015-07-29 (×5): qty 1

## 2015-07-29 MED ORDER — SODIUM CHLORIDE 0.9 % IV SOLN
INTRAVENOUS | Status: DC
  Administered 2015-07-29 – 2015-08-01 (×6): via INTRAVENOUS

## 2015-07-29 MED ORDER — VANCOMYCIN HCL IN DEXTROSE 1-5 GM/200ML-% IV SOLN
1000.0000 mg | Freq: Once | INTRAVENOUS | Status: DC
  Filled 2015-07-29: qty 200

## 2015-07-29 MED ORDER — PIPERACILLIN-TAZOBACTAM 3.375 G IVPB
3.3750 g | Freq: Three times a day (TID) | INTRAVENOUS | Status: DC
  Administered 2015-07-30 – 2015-08-01 (×7): 3.375 g via INTRAVENOUS
  Filled 2015-07-29 (×8): qty 50

## 2015-07-29 MED ORDER — LATANOPROST 0.005 % OP SOLN
1.0000 [drp] | Freq: Every day | OPHTHALMIC | Status: DC
  Administered 2015-07-29 – 2015-08-02 (×5): 1 [drp] via OPHTHALMIC
  Filled 2015-07-29: qty 2.5

## 2015-07-29 MED ORDER — CLOPIDOGREL BISULFATE 75 MG PO TABS
75.0000 mg | ORAL_TABLET | Freq: Every day | ORAL | Status: DC
Start: 2015-07-30 — End: 2015-08-03
  Administered 2015-07-30 – 2015-08-03 (×5): 75 mg via ORAL
  Filled 2015-07-29 (×5): qty 1

## 2015-07-29 NOTE — ED Notes (Signed)
Pt presents after being notified of positive blood cultures.  Pt c/o weakness.  Denies pain.  Pt was seen at [facility] x 2 days ago and diagnosed w/ a virus.  Pt reports that he has been eating and drinking ok.

## 2015-07-29 NOTE — Telephone Encounter (Signed)
Lab called positive blood culture, aerobic bottle, gram negative rods. Dr Betsey Holiday notified, instructed flow manager to call patient to return to ED for recheck patient notified of positive blood cultures and instructed to return to ED for recheck. Patient and spouse verbalized understanding.

## 2015-07-29 NOTE — Progress Notes (Signed)
ANTIBIOTIC CONSULT NOTE - INITIAL  Pharmacy Consult for vancomycin/zosyn Indication:   Allergies  Allergen Reactions  . Amlodipine Besylate Hives  . Hydralazine Itching  . Lipitor [Atorvastatin] Itching  . Naproxen Diarrhea  . Oxycodone-Acetaminophen Other (See Comments)    REACTION: unspecified    Patient Measurements:   Adjusted Body Weight:   Vital Signs: Temp: 98.7 F (37.1 C) (08/06 1510) Temp Source: Oral (08/06 1510) BP: 191/53 mmHg (08/06 1713) Pulse Rate: 64 (08/06 1713) Intake/Output from previous day:   Intake/Output from this shift:    Labs:  Recent Labs  07/27/15 0043 07/29/15 1615  WBC 4.1 7.3  HGB 10.6* 11.8*  PLT 65* 85*  CREATININE 1.91* 2.03*   Estimated Creatinine Clearance: 23.5 mL/min (by C-G formula based on Cr of 2.03). No results for input(s): VANCOTROUGH, VANCOPEAK, VANCORANDOM, GENTTROUGH, GENTPEAK, GENTRANDOM, TOBRATROUGH, TOBRAPEAK, TOBRARND, AMIKACINPEAK, AMIKACINTROU, AMIKACIN in the last 72 hours.   Microbiology: Recent Results (from the past 720 hour(s))  Culture, blood (routine x 2)     Status: None (Preliminary result)   Collection Time: 07/27/15 12:45 AM  Result Value Ref Range Status   Specimen Description BLOOD BLOOD LEFT HAND  Final   Special Requests BOTTLES DRAWN AEROBIC AND ANAEROBIC 5ML  Final   Culture   Final    NO GROWTH 2 DAYS Performed at [facility]    Report Status PENDING  Incomplete  Culture, blood (routine x 2)     Status: None (Preliminary result)   Collection Time: 07/27/15 12:50 AM  Result Value Ref Range Status   Specimen Description BLOOD BLOOD RIGHT FOREARM  Final   Special Requests BOTTLES DRAWN AEROBIC AND ANAEROBIC 10CC  Final   Culture  Setup Time   Final    GRAM NEGATIVE RODS AEROBIC BOTTLE ONLY CRITICAL RESULT CALLED TO, READ BACK BY AND VERIFIED WITH: S GANNONS 07/29/15 @ 2 M VESTAL    Culture   Final    GRAM NEGATIVE RODS Performed at [facility]    Report Status  PENDING  Incomplete    Medical History: Past Medical History  Diagnosis Date  . CAD in native artery 2001; 2011; 2014; 05/2014    a) 2001 & 2011 -- Med Rx for: 60-70% ostial Cx-OM lesion, 40-50% lesions in the LAD and RC; b) Persantine Myoview 2012: No ischemia or infarction, EF 66% c) 10/'14: RCA 100% - PCI,CxOM stable, LAD ~70% mid; d) UA 6/'15: 75% ISR in RCA - PTCA, LAD lesion ~40%, CxOM stable.    . ST elevation myocardial infarction (STEMI) of inferior wall, emergent PCI with Promus DES to RCA 10/05/2013    100% mRCA occlusion; residual 80% OM2, ~70% LAD --> PCI to RCA  . CAD S/P percutaneous coronary angioplasty 10/05/2013; 05/2014    a) 10/'14: PCI to RCA - Promus Premier DES 3.0 mm x 28 mm to mRCA (~3.19 mm);; b) 6/'15: Cutting Balloon PTCA of 75 % RCA ISR  . Moderate aortic insufficiency February 2012    Echocardiogram: EF > 55%; impaired relaxation. Mild/moderate MAC with mild MR; Moderate Aortic Regurgitation and mild sclerosis/ NO stenosis; mild - mod Pulm HTN (40-75mmHg);; Follow-up 05/2013: EF 65-70%, PAP ~31 mmH, otherwise no change  . Mild aortic stenosis  10/05/2013    Echo post STEMI: EF 55-60%. No regional W. MA. Mild aortic stenosis with mild regurgitation. MAC. Moderate pulmonary hypertension, 58 mmHg.  . Carotid artery stenosis  December 2012    s/p L CEA  . Hypertension, benign   .  Hyperlipidemia LDL goal <70   . CKD (chronic kidney disease), stage III   . COPD (chronic obstructive pulmonary disease)  12/07/2011  . Thrombocytopenia, acquired 06/30/2007  . Anemia, B12 deficiency 11/04/2008  . Personal history of peptic ulcer disease 06/30/2007  . GERD (gastroesophageal reflux disease) 06/30/2007  . Irritable bowel syndrome (IBS)  12/08/2008  . Gallstones  12/07/2011  . Hernia, hiatal  08/25/2008  . 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 History of colonic polyps 08/25/2008  . Diverticulosis of colon 12/08/2008  . Osteoarthritis 05/22/2008; 06/30/2007     with chronic  upper and lumbar back pain as well as neck pain.  . Chronic back pain 12/06/2011  . Neck pain, chronic 12/06/2011  . Osteopenia 10/18/2008  . Hypothyroidism 10/18/2008  . Finger amputation, traumatic   . Personal history of TIA (transient ischemic attack) 2007  . GERD (gastroesophageal reflux disease) 01/24/2015   Assessment: 85 YOM instructed to return to ED for GNR in 1/2 blood cultures from 8/4. He was seen in ED on 8/4 for fever, chills, N/V and abdominal pain.  Orders for vancomycin and zosyn per pharmacy  8/6 >>vancomycin  >> 8/6 >> zosyn  >>    8/4 blood: GNR 1/2 (final ID pending) 8/6 blood: 8/6 urine:   Renal: SCr elevated, normalized CrCl 15ml/min WBC WNL afebrile  Dose changes/levels:  Goal of Therapy:  Vancomycin trough level 15-20 mcg/ml  Dose for indication and for patient-specific parameters  Plan:   Vancomycin 1gm x 1 ordered in ED then 750mg  IV q24h starting 8/7 am  Will confirm with prescriber that needs vancomycin based on GNR in blood culture (sent text paged) --> received orders to stop vancomycin  Zosyn 3.375 gm IV x 1 over 30 min in ED then 3.375gm IV q8h over 4h infusion  Borderline CrCl for extended interval dosing - monitor renal function closely  Doreene Eland, PharmD, BCPS.   Pager: [phone] 07/29/2015,6:13 PM

## 2015-07-29 NOTE — ED Provider Notes (Signed)
CSN: [id number]     Arrival date & time 07/29/15  1408 History   First MD Initiated Contact with Patient 07/29/15 1439     Chief Complaint  Patient presents with  . + blood cultures   . Weakness      HPI Pt was seen at 1510.  Per pt and his wife, c/o gradual onset and persistence of constant generalized weakness/fatigue that began this morning. Pt states he was seen in the ED 2 days ago for fever/chills, N/V and abd pain. States his workup was "ok" and he was discharged. Pt states he has not had any further symptoms since ED discharge. Pt states he was called today and told he needed to come back to the ED for "positive blood cultures." Denies abd pain, no N/V/D, no further fevers/chills, no CP/SOB, no cough, no back pain, no rash.    Past Medical History  Diagnosis Date  . CAD in native artery 2001; 2011; 2014; 05/2014    a) 2001 & 2011 -- Med Rx for: 60-70% ostial Cx-OM lesion, 40-50% lesions in the LAD and RC; b) Persantine Myoview 2012: No ischemia or infarction, EF 66% c) 10/'14: RCA 100% - PCI,CxOM stable, LAD ~70% mid; d) UA 6/'15: 75% ISR in RCA - PTCA, LAD lesion ~40%, CxOM stable.    . ST elevation myocardial infarction (STEMI) of inferior wall, emergent PCI with Promus DES to RCA 10/05/2013    100% mRCA occlusion; residual 80% OM2, ~70% LAD --> PCI to RCA  . CAD S/P percutaneous coronary angioplasty 10/05/2013; 05/2014    a) 10/'14: PCI to RCA - Promus Premier DES 3.0 mm x 28 mm to mRCA (~3.19 mm);; b) 6/'15: Cutting Balloon PTCA of 75 % RCA ISR  . Moderate aortic insufficiency February 2012    Echocardiogram: EF > 55%; impaired relaxation. Mild/moderate MAC with mild MR; Moderate Aortic Regurgitation and mild sclerosis/ NO stenosis; mild - mod Pulm HTN (40-90mmHg);; Follow-up 05/2013: EF 65-70%, PAP ~31 mmH, otherwise no change  . Mild aortic stenosis  10/05/2013    Echo post STEMI: EF 55-60%. No regional W. MA. Mild aortic stenosis with mild regurgitation. MAC. Moderate pulmonary  hypertension, 58 mmHg.  . Carotid artery stenosis  December 2012    s/p L CEA  . Hypertension, benign   . Hyperlipidemia LDL goal <70   . CKD (chronic kidney disease), stage III   . COPD (chronic obstructive pulmonary disease)  12/07/2011  . Thrombocytopenia, acquired 06/30/2007  . Anemia, B12 deficiency 11/04/2008  . Personal history of peptic ulcer disease 06/30/2007  . GERD (gastroesophageal reflux disease) 06/30/2007  . Irritable bowel syndrome (IBS)  12/08/2008  . Gallstones  12/07/2011  . Hernia, hiatal  08/25/2008  . 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 History of colonic polyps 08/25/2008  . Diverticulosis of colon 12/08/2008  . Osteoarthritis 05/22/2008; 06/30/2007     with chronic upper and lumbar back pain as well as neck pain.  . Chronic back pain 12/06/2011  . Neck pain, chronic 12/06/2011  . Osteopenia 10/18/2008  . Hypothyroidism 10/18/2008  . Finger amputation, traumatic   . Personal history of TIA (transient ischemic attack) 2007  . GERD (gastroesophageal reflux disease) 01/24/2015   Past Surgical History  Procedure Laterality Date  . Foot surgery      left  . Carpal tunnel release    . Hernia repair    . Total knee arthroplasty    . Carotid endarterectomy Left 2007    Dr.Hayes  . Amputation  left index finger -tramatic  . Vagotomy      partial gastrectomy  . Partial gastrectomy    . Corneal transplant    . Rotator cuff repair      left  . Spine surgery  1985    cervical laminectomy  . Cardiac catheterization  November 2011    40% mid LAD, 50% proximal RCA, 30-40% distal RCA (DOMINANT RCA with wraparound PDA & 2 large PLBs), 60-70% ostial OM1. No change from 2001. Medical therapy  . Shoulder arthroscopy with rotator cuff repair and subacromial decompression Right 07/01/2013    Procedure: RIGHT SHOULDER ARTHROSCOPY WITH  SUBACROMIAL DECOMPRESSION/DISTAL CLAVICLE RESECTION/ROTATOR CUFF REPAIR ;  Surgeon: Marin Shutter, MD;  Location: [place];  Service:  Orthopedics;  Laterality: Right;  . Nm myoview ltd  11/2013; 08/1014    a) 12/'14: EF 59%, Fixed Inferior-Inferoapical Defect c/w Inferior Scar; No Ischemia; b) 9/'15:  9/'15: EF 55% - persistent fixed inferior-inferoapical perfusion defect  - read as diaphragmatic attenuation, likely consistent with infarct/scar   . Left heart catheterization with coronary angiogram N/A 10/05/2013    Procedure: LEFT HEART CATHETERIZATION WITH CORONARY ANGIOGRAM;  Surgeon: Peter M Martinique, MD;  Location[facility] C[facility] LAB;  Service: Cardiovascular;  RCA 100% - PCI,CxOM stable, LAD ~70% mid;  . Left heart catheterization with coronary angiogram N/A 06/08/2014    Procedure: LEFT HEART CATHETERIZATION WITH CORONARY ANGIOGRAM;  Surgeon: Troy Sine, MD;  Location: [facility] CATH LAB;  Service: Cardiovascular;  UA 6/'15: 75% ISR in RCA - PTCA, LAD lesion ~40%, CxOM stable.    . Percutaneous coronary intervention-balloon only  06/08/2014    Procedure: PERCUTANEOUS CORONARY INTERVENTION-BALLOON ONLY;  Surgeon: Troy Sine, MD;  Location: [facility] CATH LAB;  Service: Cardiovascular;;  . Coronary angioplasty with stent placement  09/2013; 05/2014     d) Promus DES 3.0 mm x 28 mm (3.2 mm)  to RCA with STEMI; residual 70% mid-distal LAD, OM 270-80%. Medical management; b) PTCA of 75 % ISR , LAD lesion noted as ~40%   Family History  Problem Relation Age of Onset  . Heart disease Sister   . Diabetes Sister    History  Substance Use Topics  . Smoking status: Former Smoker    Types: Cigarettes    Quit date: 12/23/1964  . Smokeless tobacco: Never Used  . Alcohol Use: No    Review of Systems ROS: Statement: All systems negative except as marked or noted in the HPI; Constitutional: Negative for fever and chills. +generalized weakness/fatigue. ; ; Eyes: Negative for eye pain, redness and discharge. ; ; ENMT: Negative for ear pain, hoarseness, nasal congestion, sinus pressure and sore throat. ; ; Cardiovascular: Negative for chest pain,  palpitations, diaphoresis, dyspnea and peripheral edema. ; ; Respiratory: Negative for cough, wheezing and stridor. ; ; Gastrointestinal: Negative for nausea, vomiting, diarrhea, abdominal pain, blood in stool, hematemesis, jaundice and rectal bleeding. . ; ; Genitourinary: Negative for dysuria, flank pain and hematuria. ; ; Musculoskeletal: Negative for back pain and neck pain. Negative for swelling and trauma.; ; Skin: Negative for pruritus, rash, abrasions, blisters, bruising and skin lesion.; ; Neuro: Negative for headache, lightheadedness and neck stiffness. Negative for altered level of consciousness , altered mental status, extremity weakness, paresthesias, involuntary movement, seizure and syncope.     Allergies  Amlodipine besylate; Hydralazine; Lipitor; Naproxen; and Oxycodone-acetaminophen  Home Medications   Prior to Admission medications   Medication Sig Start Date End Date Taking? Authorizing Provider  brinzolamide (AZOPT) 1 %  ophthalmic suspension Place 1 drop into the left eye every 12 (twelve) hours.    Yes Historical Provider, MD  clopidogrel (PLAVIX) 75 MG tablet TAKE 1 TABLET (75 MG TOTAL) BY MOUTH DAILY. 06/15/15  Yes Leonie Man, MD  doxycycline (VIBRA-TABS) 100 MG tablet Take 1 tablet (100 mg total) by mouth 2 (two) times daily. 07/20/15  Yes Biagio Borg, MD  isosorbide mononitrate (IMDUR) 30 MG 24 hr tablet Take 1 tablet (30 mg total) by mouth daily. 09/05/14  Yes Leonie Man, MD  losartan (COZAAR) 50 MG tablet Take 1 tablet (50 mg total) by mouth daily. 12/13/14  Yes Leonie Man, MD  metoprolol succinate (TOPROL-XL) 25 MG 24 hr tablet Take 1 tablet (25 mg total) by mouth daily. Patient taking differently: Take 25 mg by mouth at bedtime.  06/13/15  Yes Leonie Man, MD  naphazoline-glycerin (CLEAR EYES) 0.012-0.2 % SOLN Place 1-2 drops into the left eye every 4 (four) hours as needed for irritation.   Yes Historical Provider, MD  TRAVATAN Z 0.004 % SOLN  ophthalmic solution Place 1 drop into both eyes at bedtime.  04/20/13  Yes Historical Provider, MD  nitroGLYCERIN (NITROSTAT) 0.4 MG SL tablet Place 1 tablet (0.4 mg total) under the tongue every 5 (five) minutes as needed. For chest pain. 06/02/14   Leonie Man, MD  ondansetron (ZOFRAN ODT) 8 MG disintegrating tablet Take 1 tablet (8 mg total) by mouth every 8 (eight) hours as needed for nausea or vomiting. 07/27/15   Linton Flemings, MD  pantoprazole (PROTONIX) 40 MG tablet Take 1 tablet (40 mg total) by mouth daily. Patient not taking: Reported on 07/27/2015 01/24/15   Biagio Borg, MD   BP 100/81 mmHg  Pulse 64  Temp(Src) 98.7 F (37.1 C) (Oral)  Resp 16  SpO2 100%   17:16 Orthostatic Vital Signs QA  Orthostatic Lying  - BP- Lying: 191/53 mmHg ; Pulse- Lying: 65  Orthostatic Sitting - BP- Sitting: 165/97 mmHg ; Pulse- Sitting: 68  Orthostatic Standing at 0 minutes - BP- Standing at 0 minutes: 144/67 mmHg ; Pulse- Standing at 0 minutes: 72      Physical Exam  1515: Physical examination:  Nursing notes reviewed; Vital signs and O2 SAT reviewed;  Constitutional: Well developed, Well nourished, Well hydrated, In no acute distress; Head:  Normocephalic, atraumatic; Eyes: EOMI, PERRL, No scleral icterus; ENMT: Mouth and pharynx normal, Mucous membranes moist; Neck: Supple, Full range of motion, No lymphadenopathy; Cardiovascular: Regular rate and rhythm, No gallop; Respiratory: Breath sounds clear & equal bilaterally, No wheezes.  Speaking full sentences with ease, Normal respiratory effort/excursion; Chest: Nontender, Movement normal; Abdomen: Soft, Nontender, Nondistended, Normal bowel sounds; Genitourinary: No CVA tenderness; Extremities: Pulses normal, No tenderness, No edema, No calf edema or asymmetry.; Neuro: AA&Ox3, Major CN grossly intact.  Speech clear. No gross focal motor or sensory deficits in extremities.; Skin: Color normal, Warm, Dry.     ED Course  Procedures     EKG  Interpretation   Date/Time:  Saturday July 29 2015 17:58:23 EDT Ventricular Rate:  63 PR Interval:  159 QRS Duration: 85 QT Interval:  397 QTC Calculation: 406 R Axis:   -11 Text Interpretation:  Sinus rhythm Anterior infarct, old When compared  with ECG of 06/09/2014 No significant change was found Confirmed by [facility]   MD, Nunzio Cory [phone]) on 07/29/2015 6:01:54 PM      MDM  MDM Reviewed: previous chart, nursing note and vitals Reviewed previous: labs  and ECG Interpretation: labs, ECG and x-ray     Culture, blood (routine x 2)  Status: Preliminaryresult Visible to patient:  Not Released Nextappt: 08/02/2015 at 10:15 AM in Internal Medicine Cathlean Cower, MD)         2d ago    Specimen Description BLOOD BLOOD RIGHT FOREARM   Special Requests BOTTLES DRAWN AEROBIC AND ANAEROBIC 10CC   Culture Setup Time GRAM NEGATIVE RODS  AEROBIC BOTTLE ONLY  CRITICAL RESULT CALLED TO, READ BACK BY AND VERIFIED WITH: S GANNONS 07/29/15 @ 76 M VESTAL       Culture GRAM NEGATIVE RODS  Performed at [facility]       Report Status PENDING   Resulting Agency [facility]      Specimen Collected: 07/27/15 12:50 AM Last Resulted: 07/29/15 11:22 AM          Results for orders placed or performed during the hospital encounter of [id number]  Basic metabolic panel  Result Value Ref Range   Sodium 139 135 - 145 mmol/L   Potassium 5.0 3.5 - 5.1 mmol/L   Chloride 110 101 - 111 mmol/L   CO2 22 22 - 32 mmol/L   Glucose, Bld 104 (H) 65 - 99 mg/dL   BUN 48 (H) 6 - 20 mg/dL   Creatinine, Ser 2.03 (H) 0.61 - 1.24 mg/dL   Calcium 9.3 8.9 - 10.3 mg/dL   GFR calc non Af Amer 29 (L) >60 mL/min   GFR calc Af Amer 33 (L) >60 mL/min   Anion gap 7 5 - 15  Urinalysis, Routine w reflex microscopic (not at [facility])  Result Value Ref Range   Color, Urine YELLOW YELLOW   APPearance CLEAR CLEAR   Specific Gravity, Urine 1.014 1.005 - 1.030   pH 5.5 5.0 - 8.0   Glucose, UA  NEGATIVE NEGATIVE mg/dL   Hgb urine dipstick NEGATIVE NEGATIVE   Bilirubin Urine NEGATIVE NEGATIVE   Ketones, ur NEGATIVE NEGATIVE mg/dL   Protein, ur NEGATIVE NEGATIVE mg/dL   Urobilinogen, UA 0.2 0.0 - 1.0 mg/dL   Nitrite NEGATIVE NEGATIVE   Leukocytes, UA NEGATIVE NEGATIVE  Lactic acid, plasma  Result Value Ref Range   Lactic Acid, Venous 1.6 0.5 - 2.0 mmol/L  Troponin I  Result Value Ref Range   Troponin I 0.04 (H) <0.031 ng/mL  Hepatic function panel  Result Value Ref Range   Total Protein 7.0 6.5 - 8.1 g/dL   Albumin 3.9 3.5 - 5.0 g/dL   AST 24 15 - 41 U/L   ALT 13 (L) 17 - 63 U/L   Alkaline Phosphatase 80 38 - 126 U/L   Total Bilirubin 0.8 0.3 - 1.2 mg/dL   Bilirubin, Direct 0.2 0.1 - 0.5 mg/dL   Indirect Bilirubin 0.6 0.3 - 0.9 mg/dL  CBC with Differential/Platelet  Result Value Ref Range   WBC 7.3 4.0 - 10.5 K/uL   RBC 4.11 (L) 4.22 - 5.81 MIL/uL   Hemoglobin 11.8 (L) 13.0 - 17.0 g/dL   HCT 35.9 (L) 39.0 - 52.0 %   MCV 87.3 78.0 - 100.0 fL   MCH 28.7 26.0 - 34.0 pg   MCHC 32.9 30.0 - 36.0 g/dL   RDW 14.7 11.5 - 15.5 %   Platelets 85 (L) 150 - 400 K/uL   Neutrophils Relative % 71 43 - 77 %   Neutro Abs 5.2 1.7 - 7.7 K/uL   Lymphocytes Relative 17 12 - 46 %   Lymphs Abs 1.2 0.7 - 4.0 K/uL  Monocytes Relative 11 3 - 12 %   Monocytes Absolute 0.8 0.1 - 1.0 K/uL   Eosinophils Relative 2 0 - 5 %   Eosinophils Absolute 0.1 0.0 - 0.7 K/uL   Basophils Relative 0 0 - 1 %   Basophils Absolute 0.0 0.0 - 0.1 K/uL  CBG monitoring, ED  Result Value Ref Range   Glucose-Capillary 87 65 - 99 mg/dL   Ct Abdomen Pelvis Wo Contrast 07/27/2015   CLINICAL DATA:  Nausea vomiting and chills.  EXAM: CT ABDOMEN AND PELVIS WITHOUT CONTRAST  TECHNIQUE: Multidetector CT imaging of the abdomen and pelvis was performed following the standard protocol without IV contrast.  COMPARISON:  11/27/2008  FINDINGS: Lower chest:  No significant abnormality  Hepatobiliary: Cholelithiasis. Unremarkable  unenhanced appearances of the liver. Bile ducts unremarkable.  Pancreas: Unremarkable  Spleen: Under  Adrenals/Urinary Tract: The adrenals and kidneys are remarkable only for mild renal atrophy bilaterally. There is no urinary calculus. There is no hydronephrosis or ureteral dilatation. Urinary bladder is unremarkable.  Stomach/Bowel: Surgical clips around the stomach. Otherwise unremarkable appearances of the stomach and small bowel. Appendix is normal. There is extensive colonic diverticulosis. There is no evidence of diverticulitis or other acute inflammatory process involving the bowel. There is no bowel obstruction. There is no extraluminal air.  Vascular/Lymphatic: The abdominal aorta is normal in caliber. There is extent atherosclerotic calcification. There is no adenopathy in the abdomen or pelvis.  Reproductive: Moderate prostate enlargement with elevation of the bladder base.  Other: No ascites. No acute findings are evident in the abdomen or pelvis.  Musculoskeletal: Moderately severe degenerative disc changes at L5-S1. There also is moderately severe facet arthritis at L4-5 and L5-S1 on the right. There is grade 1 spondylolisthesis at L5-S1.  IMPRESSION: 1. No acute findings are evident in the abdomen or pelvis. 2. Cholelithiasis 3. Diverticulosis 4. Severe lumbar degenerative disc and facet changes.   Electronically Signed   By: Andreas Newport M.D.   On: 07/27/2015 04:29   Dg Chest 2 View 07/29/2015   CLINICAL DATA:  Positive blood cultures. Weakness. Elevated blood pressure today. Initial encounter.  EXAM: CHEST  2 VIEW  COMPARISON:  Radiographs 07/27/2015 and 06/07/2014.  FINDINGS: The heart size and mediastinal contours are stable. Aortic atherosclerosis and a coronary artery stent are noted. The lungs are clear. There is no pleural effusion or pneumothorax. The bones appear unchanged. Multiple telemetry leads overlie the chest.  IMPRESSION: Stable chest.  No acute cardiopulmonary process.    Electronically Signed   By: Richardean Sale M.D.   On: 07/29/2015 17:11    [id number]:   No clear source for +blood cultures at this time. IV abx started after [facility] x2 and UC obtained today. Dx and testing d/w pt and family.  Questions answered.  Verb understanding, agreeable to admit.  T/C to [facility]. Erlinda Hong, case discussed, including:  HPI, pertinent PM/SHx, VS/PE, dx testing, ED course and treatment:  Agreeable to admit, requests to write temporary orders, obtain tele bed to team WLAdmits.    Francine Graven, DO 07/31/15 1437

## 2015-07-29 NOTE — H&P (Addendum)
History and Physical  Jamarques Erich [id number] DOB: [birth year] DOA: 07/29/2015  Referring physician: EDP PCP: Cathlean Cower, MD   Chief Complaint: g-rod bacteremia  HPI: Nicholas Porter is a 81 y.o. male   Very pleasant gentlemen was called back to ED due to blood culture drawn on 8/4 grew G-rods. Patient was seen in the ED due to fever, abpain, n/v on 8/4, work up unremarkable at the time, he was diagnosed as viral illness and discharged home, patient reported he felt fine after being discharged home, no more n/v, no discomfort, he was called  Back to the ED today, vital stable, he received zosyn , hospitalist called to admit the patient.  He did report chills on 8/4, but none since discharged from the ED on 8/4, denies cough, no sob, no weakness, no dysuria, no diarrhea, no fever, no n/v, no ab pain, no chest pain, no dizziness.  Review of Systems:  Detail per HPI, Review of systems are otherwise negative  Past Medical History  Diagnosis Date  . CAD in native artery 2001; 2011; 2014; 05/2014    a) 2001 & 2011 -- Med Rx for: 60-70% ostial Cx-OM lesion, 40-50% lesions in the LAD and RC; b) Persantine Myoview 2012: No ischemia or infarction, EF 66% c) 10/'14: RCA 100% - PCI,CxOM stable, LAD ~70% mid; d) UA 6/'15: 75% ISR in RCA - PTCA, LAD lesion ~40%, CxOM stable.    . ST elevation myocardial infarction (STEMI) of inferior wall, emergent PCI with Promus DES to RCA 10/05/2013    100% mRCA occlusion; residual 80% OM2, ~70% LAD --> PCI to RCA  . CAD S/P percutaneous coronary angioplasty 10/05/2013; 05/2014    a) 10/'14: PCI to RCA - Promus Premier DES 3.0 mm x 28 mm to mRCA (~3.19 mm);; b) 6/'15: Cutting Balloon PTCA of 75 % RCA ISR  . Moderate aortic insufficiency February 2012    Echocardiogram: EF > 55%; impaired relaxation. Mild/moderate MAC with mild MR; Moderate Aortic Regurgitation and mild sclerosis/ NO stenosis; mild - mod Pulm HTN (40-55mmHg);; Follow-up 05/2013: EF 65-70%, PAP ~31 mmH,  otherwise no change  . Mild aortic stenosis  10/05/2013    Echo post STEMI: EF 55-60%. No regional W. MA. Mild aortic stenosis with mild regurgitation. MAC. Moderate pulmonary hypertension, 58 mmHg.  . Carotid artery stenosis  December 2012    s/p L CEA  . Hypertension, benign   . Hyperlipidemia LDL goal <70   . CKD (chronic kidney disease), stage III   . COPD (chronic obstructive pulmonary disease)  12/07/2011  . Thrombocytopenia, acquired 06/30/2007  . Anemia, B12 deficiency 11/04/2008  . Personal history of peptic ulcer disease 06/30/2007  . GERD (gastroesophageal reflux disease) 06/30/2007  . Irritable bowel syndrome (IBS)  12/08/2008  . Gallstones  12/07/2011  . Hernia, hiatal  08/25/2008  . 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 History of colonic polyps 08/25/2008  . Diverticulosis of colon 12/08/2008  . Osteoarthritis 05/22/2008; 06/30/2007     with chronic upper and lumbar back pain as well as neck pain.  . Chronic back pain 12/06/2011  . Neck pain, chronic 12/06/2011  . Osteopenia 10/18/2008  . Hypothyroidism 10/18/2008  . Finger amputation, traumatic   . Personal history of TIA (transient ischemic attack) 2007  . GERD (gastroesophageal reflux disease) 01/24/2015   Past Surgical History  Procedure Laterality Date  . Foot surgery      left  . Carpal tunnel release    . Hernia repair    .  Total knee arthroplasty    . Carotid endarterectomy Left 2007    Dr.Hayes  . Amputation      left index finger -tramatic  . Vagotomy      partial gastrectomy  . Partial gastrectomy    . Corneal transplant    . Rotator cuff repair      left  . Spine surgery  1985    cervical laminectomy  . Cardiac catheterization  November 2011    40% mid LAD, 50% proximal RCA, 30-40% distal RCA (DOMINANT RCA with wraparound PDA & 2 large PLBs), 60-70% ostial OM1. No change from 2001. Medical therapy  . Shoulder arthroscopy with rotator cuff repair and subacromial decompression Right 07/01/2013     Procedure: RIGHT SHOULDER ARTHROSCOPY WITH  SUBACROMIAL DECOMPRESSION/DISTAL CLAVICLE RESECTION/ROTATOR CUFF REPAIR ;  Surgeon: Marin Shutter, MD;  Location: [facility];  Service: Orthopedics;  Laterality: Right;  . Nm myoview ltd  11/2013; 08/1014    a) 12/'14: EF 59%, Fixed Inferior-Inferoapical Defect c/w Inferior Scar; No Ischemia; b) 9/'15:  9/'15: EF 55% - persistent fixed inferior-inferoapical perfusion defect  - read as diaphragmatic attenuation, likely consistent with infarct/scar   . Left heart catheterization with coronary angiogram N/A 10/05/2013    Procedure: LEFT HEART CATHETERIZATION WITH CORONARY ANGIOGRAM;  Surgeon: Peter M Martinique, MD;  Location: [facility] CATH LAB;  Service: Cardiovascular;  RCA 100% - PCI,CxOM stable, LAD ~70% mid;  . Left heart catheterization with coronary angiogram N/A 06/08/2014    Procedure: LEFT HEART CATHETERIZATION WITH CORONARY ANGIOGRAM;  Surgeon: Troy Sine, MD;  Location[facility] CATH LAB;  Service: Cardiovascular;  UA 6/'15: 75% ISR in RCA - PTCA, LAD lesion ~40%, CxOM stable.    . Percutaneous coronary intervention-balloon only  06/08/2014    Procedure: PERCUTANEOUS CORONARY INTERVENTION-BALLOON ONLY;  Surgeon: Troy Sine, MD;  Location: [facility] CATH LAB;  Service: Cardiovascular;;  . Coronary angioplasty with stent placement  09/2013; 05/2014     d) Promus DES 3.0 mm x 28 mm (3.2 mm)  to RCA with STEMI; residual 70% mid-distal LAD, OM 270-80%. Medical management; b) PTCA of 75 % ISR , LAD lesion noted as ~40%   Social History:  reports that he quit smoking about 50 years ago. His smoking use included Cigarettes. He has never used smokeless tobacco. He reports that he does not drink alcohol or use illicit drugs. Patient lives at home & is able to participate in activities of daily living independently , baseline very active, still drives and do yardwork.  Allergies  Allergen Reactions  . Amlodipine Besylate Hives  . Hydralazine Itching  . Lipitor [Atorvastatin]  Itching  . Naproxen Diarrhea  . Oxycodone-Acetaminophen Other (See Comments)    REACTION: unspecified    Family History  Problem Relation Age of Onset  . Heart disease Sister   . Diabetes Sister       Prior to Admission medications   Medication Sig Start Date End Date Taking? Authorizing Provider  brinzolamide (AZOPT) 1 % ophthalmic suspension Place 1 drop into the left eye every 12 (twelve) hours.    Yes Historical Provider, MD  clopidogrel (PLAVIX) 75 MG tablet TAKE 1 TABLET (75 MG TOTAL) BY MOUTH DAILY. 06/15/15  Yes Leonie Man, MD  doxycycline (VIBRA-TABS) 100 MG tablet Take 1 tablet (100 mg total) by mouth 2 (two) times daily. 07/20/15  Yes Biagio Borg, MD  isosorbide mononitrate (IMDUR) 30 MG 24 hr tablet Take 1 tablet (30 mg total) by mouth daily.  09/05/14  Yes Leonie Man, MD  losartan (COZAAR) 50 MG tablet Take 1 tablet (50 mg total) by mouth daily. 12/13/14  Yes Leonie Man, MD  metoprolol succinate (TOPROL-XL) 25 MG 24 hr tablet Take 1 tablet (25 mg total) by mouth daily. Patient taking differently: Take 25 mg by mouth at bedtime.  06/13/15  Yes Leonie Man, MD  naphazoline-glycerin (CLEAR EYES) 0.012-0.2 % SOLN Place 1-2 drops into the left eye every 4 (four) hours as needed for irritation.   Yes Historical Provider, MD  TRAVATAN Z 0.004 % SOLN ophthalmic solution Place 1 drop into both eyes at bedtime.  04/20/13  Yes Historical Provider, MD  nitroGLYCERIN (NITROSTAT) 0.4 MG SL tablet Place 1 tablet (0.4 mg total) under the tongue every 5 (five) minutes as needed. For chest pain. 06/02/14   Leonie Man, MD  ondansetron (ZOFRAN ODT) 8 MG disintegrating tablet Take 1 tablet (8 mg total) by mouth every 8 (eight) hours as needed for nausea or vomiting. 07/27/15   Linton Flemings, MD  pantoprazole (PROTONIX) 40 MG tablet Take 1 tablet (40 mg total) by mouth daily. Patient not taking: Reported on 07/27/2015 01/24/15   Biagio Borg, MD    Physical Exam: BP 191/53 mmHg  Pulse  64  Temp(Src) 98.7 F (37.1 C) (Oral)  Resp 22  SpO2 100%  General:  NAD, very pleasant Eyes: PERRL ENT: unremarkable Neck: supple, no JVD Cardiovascular: RRR Respiratory: CTABL Abdomen: soft/ND/ND, positive bowel sounds Skin: scatter ecchymosis in upper extremities Musculoskeletal:  No edema Psychiatric: calm/cooperative Neurologic: no focal findings            Labs on Admission:  Basic Metabolic Panel:  Recent Labs Lab 07/27/15 0043 07/29/15 1615  NA 141 139  K 4.2 5.0  CL 114* 110  CO2 19* 22  GLUCOSE 129* 104*  BUN 56* 48*  CREATININE 1.91* 2.03*  CALCIUM 8.9 9.3   Liver Function Tests:  Recent Labs Lab 07/27/15 0044 07/29/15 1615  AST 24 24  ALT 12* 13*  ALKPHOS 79 80  BILITOT 0.6 0.8  PROT 6.3* 7.0  ALBUMIN 3.4* 3.9    Recent Labs Lab 07/27/15 0044  LIPASE 34   No results for input(s): AMMONIA in the last 168 hours. CBC:  Recent Labs Lab 07/27/15 0043 07/29/15 1615  WBC 4.1 7.3  NEUTROABS 3.8 5.2  HGB 10.6* 11.8*  HCT 32.5* 35.9*  MCV 88.1 87.3  PLT 65* 85*   Cardiac Enzymes:  Recent Labs Lab 07/29/15 1615  TROPONINI 0.04*    BNP (last 3 results) No results for input(s): BNP in the last 8760 hours.  ProBNP (last 3 results) No results for input(s): PROBNP in the last 8760 hours.  CBG:  Recent Labs Lab 07/29/15 1712  GLUCAP 87    Radiological Exams on Admission: Dg Chest 2 View  07/29/2015   CLINICAL DATA:  Positive blood cultures. Weakness. Elevated blood pressure today. Initial encounter.  EXAM: CHEST  2 VIEW  COMPARISON:  Radiographs 07/27/2015 and 06/07/2014.  FINDINGS: The heart size and mediastinal contours are stable. Aortic atherosclerosis and a coronary artery stent are noted. The lungs are clear. There is no pleural effusion or pneumothorax. The bones appear unchanged. Multiple telemetry leads overlie the chest.  IMPRESSION: Stable chest.  No acute cardiopulmonary process.   Electronically Signed   By: Richardean Sale M.D.   On: 07/29/2015 17:11    EKG: Independently reviewed. NSR, no acute ST/T changes  Assessment/Plan Present  on Admission:  . Positive blood culture . Bacteremia  G- rods bacteremia: unclear source, ua unremarkable, denies ab pain, did report was treated for cellulitis with doxycycline a few days ago.  Zosyn started, f/u on culture results.   Thrombocytopenia: could be due to infection, monitor plt, will continue plavix for now.  ckd III, at baseline, renal dosing meds. ua unremarkable  H/o CAD s/p MI s/p stent in 2013: stable, no chest pain. Continue plavix/betablocker.   H/o HTN: continue betablocker , held imdur/cozaar.   DVT prophylaxis: scd's  Consultants: none  Code Status: DNR/DNI, confirmed with patient and wife  Family Communication:  Patient and wife  Disposition Plan: admit to med tele  Time spent: 1mins  Yoandri Congrove MD, PhD [facility] Pager [phone] If 7PM-7AM, please contact night-coverage at [email or web address], password [facility]

## 2015-07-29 NOTE — ED Notes (Signed)
Attempted lab drawns by EMT and RN without success.  Will call Main lab for draw

## 2015-07-29 NOTE — ED Notes (Signed)
Patient was recalled back to hospital due to positive blood cultures.  He states he has been feeling fairly well until his morning. He describes himself as just feeling tired and week.

## 2015-07-29 NOTE — ED Notes (Signed)
MD at bedside. 

## 2015-07-30 LAB — COMPREHENSIVE METABOLIC PANEL
ALBUMIN: 3.3 g/dL — AB (ref 3.5–5.0)
ALT: 10 U/L — AB (ref 17–63)
AST: 18 U/L (ref 15–41)
Alkaline Phosphatase: 68 U/L (ref 38–126)
Anion gap: 5 (ref 5–15)
BUN: 42 mg/dL — ABNORMAL HIGH (ref 6–20)
CALCIUM: 8.9 mg/dL (ref 8.9–10.3)
CO2: 21 mmol/L — ABNORMAL LOW (ref 22–32)
CREATININE: 1.92 mg/dL — AB (ref 0.61–1.24)
Chloride: 114 mmol/L — ABNORMAL HIGH (ref 101–111)
GFR calc Af Amer: 36 mL/min — ABNORMAL LOW (ref >60)
GFR calc non Af Amer: 31 mL/min — ABNORMAL LOW (ref >60)
GLUCOSE: 96 mg/dL (ref 65–99)
Potassium: 4.8 mmol/L (ref 3.5–5.1)
Sodium: 140 mmol/L (ref 135–145)
Total Bilirubin: 0.7 mg/dL (ref 0.3–1.2)
Total Protein: 6 g/dL — ABNORMAL LOW (ref 6.5–8.1)

## 2015-07-30 LAB — CBC
HEMATOCRIT: 31.6 % — AB (ref 39.0–52.0)
Hemoglobin: 10.4 g/dL — ABNORMAL LOW (ref 13.0–17.0)
MCH: 28.6 pg (ref 26.0–34.0)
MCHC: 32.9 g/dL (ref 30.0–36.0)
MCV: 86.8 fL (ref 78.0–100.0)
Platelets: 65 10*3/uL — ABNORMAL LOW (ref 150–400)
RBC: 3.64 MIL/uL — ABNORMAL LOW (ref 4.22–5.81)
RDW: 14.7 % (ref 11.5–15.5)
WBC: 7.4 10*3/uL (ref 4.0–10.5)

## 2015-07-30 LAB — PROTIME-INR
INR: 1.12 (ref 0.00–1.49)
Prothrombin Time: 14.6 seconds (ref 11.6–15.2)

## 2015-07-30 MED ORDER — ACETAMINOPHEN 500 MG PO TABS
500.0000 mg | ORAL_TABLET | Freq: Four times a day (QID) | ORAL | Status: DC | PRN
  Administered 2015-07-30 – 2015-08-01 (×2): 500 mg via ORAL
  Filled 2015-07-30 (×2): qty 1

## 2015-07-30 MED ORDER — ISOSORBIDE MONONITRATE ER 30 MG PO TB24
30.0000 mg | ORAL_TABLET | Freq: Every day | ORAL | Status: DC
  Administered 2015-07-30 – 2015-08-01 (×3): 30 mg via ORAL
  Filled 2015-07-30 (×3): qty 1

## 2015-07-30 MED ORDER — VITAMINS A & D EX OINT
TOPICAL_OINTMENT | CUTANEOUS | Status: AC
  Administered 2015-07-30: 5
  Filled 2015-07-30: qty 5

## 2015-07-30 NOTE — Progress Notes (Signed)
Utilization review completed.  

## 2015-07-30 NOTE — Progress Notes (Addendum)
PROGRESS NOTE  Nicholas Porter [id number] DOB: 04/21/1930 DOA: 07/29/2015 PCP: Cathlean Cower, MD  HPI/Recap of past 24 hours:  Had uneventful night, denies pain, no fever, blood pressure stable, wife in room  Assessment/Plan: Active Problems:   Positive blood culture   Bacteremia  G- rods bacteremia: unclear source, ua unremarkable, denies ab pain, did report was treated for cellulitis with doxycycline a few days ago.  Zosyn started, f/u on culture results. No fever, no leukocytosis  Thrombocytopenia: could be due to infection, monitor plt, will continue plavix for now.  ckd III, at baseline, renal dosing meds. ua unremarkable  H/o CAD s/p MI s/p stent in 2013: stable, no chest pain. Continue plavix/betablocker.   H/o HTN: continue betablocker , held imdur/cozaar. 2pm Addendum: bp elevated, restart imdur, continue monitor.  6:50 pm addendum: patient c/o headache, h/o hydrocondon/acetaminophen allergy with hives, states ok to take tylenol, tylenol ordered. Will monitor.  DVT prophylaxis: scd's  Consultants: none  Code Status: DNR/DNI, confirmed with patient and wife  Family Communication: Patient and wife   Disposition Plan: pending blood culture result   Consultants:  none  Procedures:  none  Antibiotics:  Zosyn from 8/6   Objective: BP 130/70 mmHg  Pulse 63  Temp(Src) 98.5 F (36.9 C) (Oral)  Resp 16  Ht 5\' 7"  (1.702 m)  Wt 60.963 kg (134 lb 6.4 oz)  BMI 21.04 kg/m2  SpO2 99%  Intake/Output Summary (Last 24 hours) at 07/30/15 1007 Last data filed at 07/30/15 0600  Gross per 24 hour  Intake    875 ml  Output      0 ml  Net    875 ml   Filed Weights   07/29/15 2039  Weight: 60.963 kg (134 lb 6.4 oz)    Exam:   General:  NAD  Cardiovascular: RRR  Respiratory: CTABL  Abdomen: Soft/ND/NT, positive BS  Musculoskeletal: No Edema  Neuro: aaox3, pleasant  Data Reviewed: Basic Metabolic Panel:  Recent Labs Lab 07/27/15 0043  07/29/15 1615 07/30/15 0524  NA 141 139 140  K 4.2 5.0 4.8  CL 114* 110 114*  CO2 19* 22 21*  GLUCOSE 129* 104* 96  BUN 56* 48* 42*  CREATININE 1.91* 2.03* 1.92*  CALCIUM 8.9 9.3 8.9   Liver Function Tests:  Recent Labs Lab 07/27/15 0044 07/29/15 1615 07/30/15 0524  AST 24 24 18   ALT 12* 13* 10*  ALKPHOS 79 80 68  BILITOT 0.6 0.8 0.7  PROT 6.3* 7.0 6.0*  ALBUMIN 3.4* 3.9 3.3*    Recent Labs Lab 07/27/15 0044  LIPASE 34   No results for input(s): AMMONIA in the last 168 hours. CBC:  Recent Labs Lab 07/27/15 0043 07/29/15 1615 07/30/15 0524  WBC 4.1 7.3 7.4  NEUTROABS 3.8 5.2  --   HGB 10.6* 11.8* 10.4*  HCT 32.5* 35.9* 31.6*  MCV 88.1 87.3 86.8  PLT 65* 85* 65*   Cardiac Enzymes:    Recent Labs Lab 07/29/15 1615  TROPONINI 0.04*   BNP (last 3 results) No results for input(s): BNP in the last 8760 hours.  ProBNP (last 3 results) No results for input(s): PROBNP in the last 8760 hours.  CBG:  Recent Labs Lab 07/29/15 1712  GLUCAP 87    Recent Results (from the past 240 hour(s))  Culture, blood (routine x 2)     Status: None (Preliminary result)   Collection Time: 07/27/15 12:45 AM  Result Value Ref Range Status   Specimen Description BLOOD BLOOD LEFT HAND  Final   Special Requests BOTTLES DRAWN AEROBIC AND ANAEROBIC 5ML  Final   Culture   Final    NO GROWTH 2 DAYS Performed at [facility]    Report Status PENDING  Incomplete  Culture, blood (routine x 2)     Status: None (Preliminary result)   Collection Time: 07/27/15 12:50 AM  Result Value Ref Range Status   Specimen Description BLOOD BLOOD RIGHT FOREARM  Final   Special Requests BOTTLES DRAWN AEROBIC AND ANAEROBIC 10CC  Final   Culture  Setup Time   Final    GRAM NEGATIVE RODS AEROBIC BOTTLE ONLY CRITICAL RESULT CALLED TO, READ BACK BY AND VERIFIED WITH: S GANNONS 07/29/15 @ 91 M VESTAL    Culture   Final    GRAM NEGATIVE RODS Performed at [facility]     Report Status PENDING  Incomplete     Studies: Dg Chest 2 View  07/29/2015   CLINICAL DATA:  Positive blood cultures. Weakness. Elevated blood pressure today. Initial encounter.  EXAM: CHEST  2 VIEW  COMPARISON:  Radiographs 07/27/2015 and 06/07/2014.  FINDINGS: The heart size and mediastinal contours are stable. Aortic atherosclerosis and a coronary artery stent are noted. The lungs are clear. There is no pleural effusion or pneumothorax. The bones appear unchanged. Multiple telemetry leads overlie the chest.  IMPRESSION: Stable chest.  No acute cardiopulmonary process.   Electronically Signed   By: Richardean Sale M.D.   On: 07/29/2015 17:11    Scheduled Meds: . antiseptic oral rinse  7 mL Mouth Rinse BID  . brinzolamide  1 drop Left Eye Q12H  . clopidogrel  75 mg Oral Daily  . latanoprost  1 drop Both Eyes QHS  . metoprolol succinate  25 mg Oral Daily  . pantoprazole  40 mg Oral Daily  . piperacillin-tazobactam (ZOSYN)  IV  3.375 g Intravenous 3 times per day    Continuous Infusions: . sodium chloride 75 mL/hr at 07/30/15 0332     Time spent: 53mins  Correen Bubolz MD, PhD  [facility] Pager [phone]. If 7PM-7AM, please contact night-coverage at [email or web address], password [facility] 07/30/2015, 10:07 AM  LOS: 1 day

## 2015-07-31 LAB — BASIC METABOLIC PANEL
Anion gap: 5 (ref 5–15)
BUN: 38 mg/dL — AB (ref 6–20)
CHLORIDE: 114 mmol/L — AB (ref 101–111)
CO2: 19 mmol/L — AB (ref 22–32)
CREATININE: 2.12 mg/dL — AB (ref 0.61–1.24)
Calcium: 8.6 mg/dL — ABNORMAL LOW (ref 8.9–10.3)
GFR calc Af Amer: 31 mL/min — ABNORMAL LOW (ref >60)
GFR calc non Af Amer: 27 mL/min — ABNORMAL LOW (ref >60)
Glucose, Bld: 95 mg/dL (ref 65–99)
Potassium: 4.5 mmol/L (ref 3.5–5.1)
SODIUM: 138 mmol/L (ref 135–145)

## 2015-07-31 LAB — CBC
HCT: 29.6 % — ABNORMAL LOW (ref 39.0–52.0)
HEMOGLOBIN: 9.4 g/dL — AB (ref 13.0–17.0)
MCH: 27.8 pg (ref 26.0–34.0)
MCHC: 31.8 g/dL (ref 30.0–36.0)
MCV: 87.6 fL (ref 78.0–100.0)
Platelets: 74 10*3/uL — ABNORMAL LOW (ref 150–400)
RBC: 3.38 MIL/uL — ABNORMAL LOW (ref 4.22–5.81)
RDW: 14.9 % (ref 11.5–15.5)
WBC: 6 10*3/uL (ref 4.0–10.5)

## 2015-07-31 MED ORDER — ACYCLOVIR 400 MG PO TABS
400.0000 mg | ORAL_TABLET | Freq: Every day | ORAL | Status: DC
  Administered 2015-07-31 – 2015-08-03 (×15): 400 mg via ORAL
  Filled 2015-07-31 (×14): qty 1

## 2015-07-31 MED ORDER — LIP MEDEX EX OINT
TOPICAL_OINTMENT | CUTANEOUS | Status: DC | PRN
  Administered 2015-08-01: 1 via TOPICAL
  Filled 2015-07-31: qty 7

## 2015-07-31 NOTE — Care Management Important Message (Signed)
Important Message  Patient Details  Name: Nicholas Porter MRN: [id number] Date of Birth: [birth year]   Medicare Important Message Given:  Yes-second notification given    Camillo Flaming 07/31/2015, 1:06 [place] Message  Patient Details  Name: Nicholas Porter MRN: [id number] Date of Birth: Aug 23, 1930   Medicare Important Message Given:  Yes-second notification given    Camillo Flaming 07/31/2015, 1:06 PM

## 2015-07-31 NOTE — Evaluation (Signed)
Physical Therapy Evaluation Patient Details Name: Nicholas Porter MRN: [id number] DOB: 18-Jan-1930 Today's Date: 07/31/2015   History of Present Illness  Pt is a pleasant 81 year old gentlemen who was called back to ED due to blood culture drawn on 8/4 grew G-rods with hx of chronic neck and back pain, STEMI with PCI, OA, traumatic finger amputation  Clinical Impression  Pt admitted with above diagnosis. Pt currently with functional limitations due to the deficits listed below (see PT Problem List).  Pt will benefit from skilled PT to increase their independence and safety with mobility to allow discharge to the venue listed below.  Pt wished to rest but agreeable to obtain orthostatic vitals.  Pt agreeable to ambulate next visit however overall supervision level for transfers and likely will not requires f/u upon d/c.  Pt denied dizziness during session.     Follow Up Recommendations No PT follow up    Equipment Recommendations  None recommended by PT    Recommendations for Other Services       Precautions / Restrictions Precautions Precautions: Fall      Mobility  Bed Mobility Overal bed mobility: Modified Independent                Transfers Overall transfer level: Needs assistance Equipment used: None Transfers: Sit to/from Stand Sit to Stand: Supervision         General transfer comment: supervision for safety, pt able to stand 4 min beside bed, orthostatics obtained and documented in flowsheets, pt declined further mobility due to desire to rest  Ambulation/Gait Ambulation/Gait assistance:  (pt declined today)              Stairs            Wheelchair Mobility    Modified Rankin (Stroke Patients Only)       Balance                                             Pertinent Vitals/Pain Pain Assessment: No/denies pain    Home Living Family/patient expects to be discharged to:: Private residence Living Arrangements:  Spouse/significant other   Type of Home: House       Home Layout: Able to live on main level with bedroom/bathroom;Laundry or work area in [place]: Environmental consultant - 2 wheels;Cane - single point      Prior Function Level of Independence: Independent               Hand Dominance        Extremity/Trunk Assessment               Lower Extremity Assessment: Overall WFL for tasks assessed         Communication   Communication: No difficulties  Cognition Arousal/Alertness: Awake/alert Behavior During Therapy: WFL for tasks assessed/performed Overall Cognitive Status: Within Functional Limits for tasks assessed                      General Comments      Exercises        Assessment/Plan    PT Assessment Patient needs continued PT services  PT Diagnosis Difficulty walking   PT Problem List Decreased activity tolerance;Decreased mobility;Decreased knowledge of use of DME  PT Treatment Interventions DME instruction;Gait training;Stair training;Functional mobility training;Therapeutic activities;Therapeutic exercise;Patient/family education   PT Goals (Current goals can  be found in the Care Plan section) Acute Rehab PT Goals PT Goal Formulation: With patient Time For Goal Achievement: 08/07/15 Potential to Achieve Goals: Good    Frequency Min 3X/week   Barriers to discharge        Co-evaluation               End of Session   Activity Tolerance: Patient tolerated treatment well Patient left: in bed;with call bell/phone within reach;with bed alarm set Nurse Communication: Mobility status         Time: 1400-1416 PT Time Calculation (min) (ACUTE ONLY): 16 min   Charges:   PT Evaluation $Initial PT Evaluation Tier I: 1 Procedure     PT G Codes:        [name],KATHrine E 07/31/2015, 2:40 PM Carmelia Bake, PT, DPT 07/31/2015 Pager: [phone]

## 2015-07-31 NOTE — Progress Notes (Signed)
Pt is complaining of increasing pain and spreading of sores in mouths.  States its a 8/10 pain and feels like blisters. Did not see any visible sores upon assessment.  Cold popsicle given to relieve pain p er patient request.   MD made aware.

## 2015-07-31 NOTE — Progress Notes (Signed)
ANTIBIOTIC CONSULT NOTE - INITIAL  Pharmacy Consult for Acyclovir Indication:   Allergies  Allergen Reactions  . Amlodipine Besylate Hives  . Hydralazine Itching  . Lipitor [Atorvastatin] Itching  . Naproxen Diarrhea  . Oxycodone-Acetaminophen Other (See Comments)    REACTION: unspecified    Patient Measurements: Height: 5\' 7"  (170.2 cm) Weight: 134 lb 6.4 oz (60.963 kg) IBW/kg (Calculated) : 66.1  Vital Signs: Temp: 98.1 F (36.7 C) (08/08 0507) Temp Source: Oral (08/08 0507) BP: 160/56 mmHg (08/08 0507) Pulse Rate: 58 (08/08 0507) Intake/Output from previous day: 08/07 0701 - 08/08 0700 In: 2745 [P.O.:720; I.V.:1875; IV Piggyback:150] Out: 950 [Urine:950] Intake/Output from this shift: Total I/O In: 240 [P.O.:240] Out: 225 [Urine:225]  Labs:  Recent Labs  07/29/15 1615 07/30/15 0524 07/31/15 0430  WBC 7.3 7.4 6.0  HGB 11.8* 10.4* 9.4*  PLT 85* 65* 74*  CREATININE 2.03* 1.92* 2.12*   Estimated Creatinine Clearance: 22.8 mL/min (by C-G formula based on Cr of 2.12). No results for input(s): VANCOTROUGH, VANCOPEAK, VANCORANDOM, GENTTROUGH, GENTPEAK, GENTRANDOM, TOBRATROUGH, TOBRAPEAK, TOBRARND, AMIKACINPEAK, AMIKACINTROU, AMIKACIN in the last 72 hours.   Microbiology: Recent Results (from the past 720 hour(s))  Culture, blood (routine x 2)     Status: None (Preliminary result)   Collection Time: 07/27/15 12:45 AM  Result Value Ref Range Status   Specimen Description BLOOD BLOOD LEFT HAND  Final   Special Requests BOTTLES DRAWN AEROBIC AND ANAEROBIC 5ML  Final   Culture   Final    NO GROWTH 3 DAYS Performed at [facility]    Report Status PENDING  Incomplete  Culture, blood (routine x 2)     Status: None (Preliminary result)   Collection Time: 07/27/15 12:50 AM  Result Value Ref Range Status   Specimen Description BLOOD BLOOD RIGHT FOREARM  Final   Special Requests BOTTLES DRAWN AEROBIC AND ANAEROBIC 10CC  Final   Culture  Setup Time   Final   GRAM NEGATIVE RODS AEROBIC BOTTLE ONLY CRITICAL RESULT CALLED TO, READ BACK BY AND VERIFIED WITH: S GANNONS 07/29/15 @ 12 M VESTAL    Culture   Final    ESCHERICHIA COLI Performed at [facility]    Report Status PENDING  Incomplete  Urine culture     Status: None (Preliminary result)   Collection Time: 07/29/15  4:45 PM  Result Value Ref Range Status   Specimen Description URINE, CLEAN CATCH  Final   Special Requests NONE  Final   Culture   Final    CULTURE REINCUBATED FOR BETTER GROWTH Performed at [facility]    Report Status PENDING  Incomplete    Medical History: Past Medical History  Diagnosis Date  . CAD in native artery 2001; 2011; 2014; 05/2014    a) 2001 & 2011 -- Med Rx for: 60-70% ostial Cx-OM lesion, 40-50% lesions in the LAD and RC; b) Persantine Myoview 2012: No ischemia or infarction, EF 66% c) 10/'14: RCA 100% - PCI,CxOM stable, LAD ~70% mid; d) UA 6/'15: 75% ISR in RCA - PTCA, LAD lesion ~40%, CxOM stable.    . ST elevation myocardial infarction (STEMI) of inferior wall, emergent PCI with Promus DES to RCA 10/05/2013    100% mRCA occlusion; residual 80% OM2, ~70% LAD --> PCI to RCA  . CAD S/P percutaneous coronary angioplasty 10/05/2013; 05/2014    a) 10/'14: PCI to RCA - Promus Premier DES 3.0 mm x 28 mm to mRCA (~3.19 mm);; b) 6/'15: Cutting Balloon PTCA of 75 %  RCA ISR  . Moderate aortic insufficiency February 2012    Echocardiogram: EF > 55%; impaired relaxation. Mild/moderate MAC with mild MR; Moderate Aortic Regurgitation and mild sclerosis/ NO stenosis; mild - mod Pulm HTN (40-59mmHg);; Follow-up 05/2013: EF 65-70%, PAP ~31 mmH, otherwise no change  . Mild aortic stenosis  10/05/2013    Echo post STEMI: EF 55-60%. No regional W. MA. Mild aortic stenosis with mild regurgitation. MAC. Moderate pulmonary hypertension, 58 mmHg.  . Carotid artery stenosis  December 2012    s/p L CEA  . Hypertension, benign   . Hyperlipidemia LDL goal <70   . CKD  (chronic kidney disease), stage III   . COPD (chronic obstructive pulmonary disease)  12/07/2011  . Thrombocytopenia, acquired 06/30/2007  . Anemia, B12 deficiency 11/04/2008  . Personal history of peptic ulcer disease 06/30/2007  . GERD (gastroesophageal reflux disease) 06/30/2007  . Irritable bowel syndrome (IBS)  12/08/2008  . Gallstones  12/07/2011  . Hernia, hiatal  08/25/2008  . 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 History of colonic polyps 08/25/2008  . Diverticulosis of colon 12/08/2008  . Osteoarthritis 05/22/2008; 06/30/2007     with chronic upper and lumbar back pain as well as neck pain.  . Chronic back pain 12/06/2011  . Neck pain, chronic 12/06/2011  . Osteopenia 10/18/2008  . Hypothyroidism 10/18/2008  . Finger amputation, traumatic   . Personal history of TIA (transient ischemic attack) 2007  . GERD (gastroesophageal reflux disease) 01/24/2015   Assessment: 85 YOM known to Pharmacy from vancomycin and zosyn protocols. Now consulted 8/8 for acyclovir for oral mucosal HSV.  8/6 >>vancomycin  >> 8/6 >> zosyn  >>   8/8 >> acyclovir >>   8/4 blood: GNR 1/2 (final ID pending) 8/6 blood: 8/6 urine:   Renal: SCr elevated, CrCl 33ml/min WBC WNL afebrile  Goal of Therapy:  Dose for indication and for patient-specific parameters  Plan:  Acyclovir 400mg  PO 5 times daily x 7 days  Peggyann Juba, PharmD, BCPS Pager: [phone] 07/31/2015,12:56 PM

## 2015-07-31 NOTE — Progress Notes (Signed)
PROGRESS NOTE  Nicholas Porter [id number] DOB: 24-Oct-1930 DOA: 07/29/2015 PCP: Cathlean Cower, MD  HPI/Recap of past 24 hours:  Reported another episode of shivering yesterday evening, symptom resoled with tylenol Reported mouth sore, blistering in mouth/gum and going up to bilateral nostrils, Reported feeling dizzy upon standing  daughter in room  Assessment/Plan: Active Problems:   Positive blood culture   Bacteremia   Oral mucosa blisters: on exam , may be one mild whitish path on right buccal mucosa, no obvious blister or mucosa change on gum/tongue/oral pharynx. Will provide blistex topical, empiric acyclovir, serum hsa pcr, start contact precaution.  G- rods bacteremia: unclear source, ua unremarkable, denies ab pain, did report was treated for cellulitis with doxycycline a few days ago.  Zosyn started, f/u on culture results. No fever, no leukocytosis.  Thrombocytopenia: could be due to infection, monitor plt, will continue plavix for now.  ckd III, at baseline, renal dosing meds. ua unremarkable  H/o CAD s/p MI s/p stent in 2013: stable, no chest pain. Continue plavix/betablocker. imdur resumed on 8/7.  H/o HTN: continue betablocker ,resumed imdur,  continue hold cozaar.  Dizziness: check orthostatic vital sign, PT eval, continue ivf for another 24hrs.  DVT prophylaxis: scd's  Consultants: none  Code Status: DNR/DNI, confirmed with patient and wife  Family Communication: Patient and daughter   Disposition Plan: pending blood culture result   Consultants:  none  Procedures:  none  Antibiotics:  Zosyn from 8/6   Objective: BP 160/56 mmHg  Pulse 58  Temp(Src) 98.1 F (36.7 C) (Oral)  Resp 20  Ht 5\' 7"  (1.702 m)  Wt 60.963 kg (134 lb 6.4 oz)  BMI 21.04 kg/m2  SpO2 100%  Intake/Output Summary (Last 24 hours) at 07/31/15 1202 Last data filed at 07/31/15 0841  Gross per 24 hour  Intake   2745 ml  Output    825 ml  Net   1920 ml   Filed  Weights   07/29/15 2039  Weight: 60.963 kg (134 lb 6.4 oz)    Exam:   General:  NAD  ENT:may be one mild whitish path on right buccal mucosa, no obvious blister or mucosa change on gum/tongue/oral pharynx  Cardiovascular: RRR  Respiratory: CTABL  Abdomen: Soft/ND/NT, positive BS  Musculoskeletal: No Edema  Neuro: aaox3, pleasant  Data Reviewed: Basic Metabolic Panel:  Recent Labs Lab 07/27/15 0043 07/29/15 1615 07/30/15 0524 07/31/15 0430  NA 141 139 140 138  K 4.2 5.0 4.8 4.5  CL 114* 110 114* 114*  CO2 19* 22 21* 19*  GLUCOSE 129* 104* 96 95  BUN 56* 48* 42* 38*  CREATININE 1.91* 2.03* 1.92* 2.12*  CALCIUM 8.9 9.3 8.9 8.6*   Liver Function Tests:  Recent Labs Lab 07/27/15 0044 07/29/15 1615 07/30/15 0524  AST 24 24 18   ALT 12* 13* 10*  ALKPHOS 79 80 68  BILITOT 0.6 0.8 0.7  PROT 6.3* 7.0 6.0*  ALBUMIN 3.4* 3.9 3.3*    Recent Labs Lab 07/27/15 0044  LIPASE 34   No results for input(s): AMMONIA in the last 168 hours. CBC:  Recent Labs Lab 07/27/15 0043 07/29/15 1615 07/30/15 0524 07/31/15 0430  WBC 4.1 7.3 7.4 6.0  NEUTROABS 3.8 5.2  --   --   HGB 10.6* 11.8* 10.4* 9.4*  HCT 32.5* 35.9* 31.6* 29.6*  MCV 88.1 87.3 86.8 87.6  PLT 65* 85* 65* 74*   Cardiac Enzymes:    Recent Labs Lab 07/29/15 1615  TROPONINI 0.04*   BNP (  last 3 results) No results for input(s): BNP in the last 8760 hours.  ProBNP (last 3 results) No results for input(s): PROBNP in the last 8760 hours.  CBG:  Recent Labs Lab 07/29/15 1712  GLUCAP 87    Recent Results (from the past 240 hour(s))  Culture, blood (routine x 2)     Status: None (Preliminary result)   Collection Time: 07/27/15 12:45 AM  Result Value Ref Range Status   Specimen Description BLOOD BLOOD LEFT HAND  Final   Special Requests BOTTLES DRAWN AEROBIC AND ANAEROBIC 5ML  Final   Culture   Final    NO GROWTH 3 DAYS Performed at [facility]    Report Status PENDING   Incomplete  Culture, blood (routine x 2)     Status: None (Preliminary result)   Collection Time: 07/27/15 12:50 AM  Result Value Ref Range Status   Specimen Description BLOOD BLOOD RIGHT FOREARM  Final   Special Requests BOTTLES DRAWN AEROBIC AND ANAEROBIC 10CC  Final   Culture  Setup Time   Final    GRAM NEGATIVE RODS AEROBIC BOTTLE ONLY CRITICAL RESULT CALLED TO, READ BACK BY AND VERIFIED WITH: S GANNONS 07/29/15 @ 74 M VESTAL    Culture   Final    ESCHERICHIA COLI Performed at [facility]    Report Status PENDING  Incomplete  Urine culture     Status: None (Preliminary result)   Collection Time: 07/29/15  4:45 PM  Result Value Ref Range Status   Specimen Description URINE, CLEAN CATCH  Final   Special Requests NONE  Final   Culture   Final    CULTURE REINCUBATED FOR BETTER GROWTH Performed at [facility]    Report Status PENDING  Incomplete     Studies: No results found.  Scheduled Meds: . antiseptic oral rinse  7 mL Mouth Rinse BID  . brinzolamide  1 drop Left Eye Q12H  . clopidogrel  75 mg Oral Daily  . isosorbide mononitrate  30 mg Oral Daily  . latanoprost  1 drop Both Eyes QHS  . metoprolol succinate  25 mg Oral Daily  . pantoprazole  40 mg Oral Daily  . piperacillin-tazobactam (ZOSYN)  IV  3.375 g Intravenous 3 times per day    Continuous Infusions: . sodium chloride 75 mL/hr at 07/31/15 0555     Time spent: 78mins  Jules Vidovich MD, PhD  [facility] Pager [phone]. If 7PM-7AM, please contact night-coverage at [email or web address], password [facility] 07/31/2015, 12:02 PM  LOS: 2 days

## 2015-08-01 LAB — CULTURE, BLOOD (ROUTINE X 2): CULTURE: NO GROWTH

## 2015-08-01 LAB — COMPREHENSIVE METABOLIC PANEL
ALBUMIN: 2.9 g/dL — AB (ref 3.5–5.0)
ALT: 9 U/L — ABNORMAL LOW (ref 17–63)
ANION GAP: 7 (ref 5–15)
AST: 17 U/L (ref 15–41)
Alkaline Phosphatase: 55 U/L (ref 38–126)
BUN: 28 mg/dL — ABNORMAL HIGH (ref 6–20)
CALCIUM: 8.8 mg/dL — AB (ref 8.9–10.3)
CO2: 20 mmol/L — ABNORMAL LOW (ref 22–32)
CREATININE: 1.97 mg/dL — AB (ref 0.61–1.24)
Chloride: 114 mmol/L — ABNORMAL HIGH (ref 101–111)
GFR calc Af Amer: 34 mL/min — ABNORMAL LOW (ref >60)
GFR calc non Af Amer: 30 mL/min — ABNORMAL LOW (ref >60)
Glucose, Bld: 90 mg/dL (ref 65–99)
Potassium: 4.7 mmol/L (ref 3.5–5.1)
Sodium: 141 mmol/L (ref 135–145)
Total Bilirubin: 0.8 mg/dL (ref 0.3–1.2)
Total Protein: 5.7 g/dL — ABNORMAL LOW (ref 6.5–8.1)

## 2015-08-01 LAB — CBC
HEMATOCRIT: 31.5 % — AB (ref 39.0–52.0)
Hemoglobin: 10.3 g/dL — ABNORMAL LOW (ref 13.0–17.0)
MCH: 28.5 pg (ref 26.0–34.0)
MCHC: 32.7 g/dL (ref 30.0–36.0)
MCV: 87.3 fL (ref 78.0–100.0)
Platelets: 79 10*3/uL — ABNORMAL LOW (ref 150–400)
RBC: 3.61 MIL/uL — AB (ref 4.22–5.81)
RDW: 14.8 % (ref 11.5–15.5)
WBC: 6.1 10*3/uL (ref 4.0–10.5)

## 2015-08-01 LAB — URINE CULTURE: Culture: 50000

## 2015-08-01 LAB — LACTIC ACID, PLASMA: LACTIC ACID, VENOUS: 0.7 mmol/L (ref 0.5–2.0)

## 2015-08-01 MED ORDER — CLONIDINE HCL 0.1 MG PO TABS
0.1000 mg | ORAL_TABLET | Freq: Once | ORAL | Status: AC
  Administered 2015-08-01: 0.1 mg via ORAL
  Filled 2015-08-01: qty 1

## 2015-08-01 MED ORDER — ISOSORBIDE MONONITRATE ER 30 MG PO TB24
60.0000 mg | ORAL_TABLET | Freq: Every day | ORAL | Status: DC
  Administered 2015-08-02 – 2015-08-03 (×2): 60 mg via ORAL
  Filled 2015-08-01 (×2): qty 2

## 2015-08-01 MED ORDER — DEXTROSE 5 % IV SOLN
2.0000 g | INTRAVENOUS | Status: DC
  Administered 2015-08-01: 2 g via INTRAVENOUS
  Filled 2015-08-01 (×2): qty 2

## 2015-08-01 NOTE — Progress Notes (Signed)
Physical Therapy Treatment Patient Details Name: Nicholas Porter MRN: [id number] DOB: [birth year] Today's Date: 2015-08-21    History of Present Illness Pt is a pleasant 81 year old gentlemen who was called back to ED due to blood culture drawn on 8/4 grew G-rods with hx of chronic neck and back pain, STEMI with PCI, OA, traumatic finger amputation    PT Comments    Pt donned his shoes independently and then ambulated around unit without difficulty.  Pt hopeful to d/c home tomorrow.  BP post-session 157/57 and HR 66 bpm.  Follow Up Recommendations  No PT follow up     Equipment Recommendations  None recommended by PT    Recommendations for Other Services       Precautions / Restrictions Precautions Precautions: Fall    Mobility  Bed Mobility Overal bed mobility: Modified Independent                Transfers Overall transfer level: Needs assistance Equipment used: None Transfers: Sit to/from Stand Sit to Stand: Supervision            Ambulation/Gait Ambulation/Gait assistance: Supervision;Min guard Ambulation Distance (Feet): 400 Feet Assistive device: None Gait Pattern/deviations: WFL(Within Functional Limits)     General Gait Details: no unsteadiness or LOB observed, pt reports feeling better   Stairs            Wheelchair Mobility    Modified Rankin (Stroke Patients Only)       Balance Overall balance assessment:  (denies hx of falls)                                  Cognition Arousal/Alertness: Awake/alert Behavior During Therapy: WFL for tasks assessed/performed Overall Cognitive Status: Within Functional Limits for tasks assessed                      Exercises      General Comments        Pertinent Vitals/Pain Pain Assessment: No/denies pain    Home Living                      Prior Function            PT Goals (current goals can now be found in the care plan section) Progress towards  PT goals: Progressing toward goals    Frequency  Min 3X/week    PT Plan Current plan remains appropriate    Co-evaluation             End of Session   Activity Tolerance: Patient tolerated treatment well Patient left: with call bell/phone within reach;in chair     Time: 1202-1221 PT Time Calculation (min) (ACUTE ONLY): 19 min  Charges:  $Gait Training: 8-22 mins                    G Codes:      Masae Lukacs,KATHrine E 08/21/15, 1:18 PM Carmelia Bake, PT, DPT 08/21/2015 Pager: [phone]

## 2015-08-01 NOTE — Progress Notes (Signed)
ANTIBIOTIC CONSULT NOTE - Follow Up  Pharmacy Consult for Zosyn Indication: Bacteremia  Allergies  Allergen Reactions  . Amlodipine Besylate Hives  . Hydralazine Itching  . Lipitor [Atorvastatin] Itching  . Naproxen Diarrhea  . Oxycodone-Acetaminophen Other (See Comments)    REACTION: unspecified    Patient Measurements: Height: 5\' 7"  (170.2 cm) Weight: 134 lb 6.4 oz (60.963 kg) IBW/kg (Calculated) : 66.1  Vital Signs: Temp: 98.1 F (36.7 C) (08/09 0450) Temp Source: Oral (08/09 0450) BP: 184/65 mmHg (08/09 0450) Pulse Rate: 67 (08/09 0450) Intake/Output from previous day: 08/08 0701 - 08/09 0700 In: 2550 [P.O.:600; I.V.:1800; IV Piggyback:150] Out: 1090 [Urine:1090] Intake/Output from this shift: Total I/O In: 120 [P.O.:120] Out: -   Labs:  Recent Labs  07/30/15 0524 07/31/15 0430 08/01/15 0428  WBC 7.4 6.0 6.1  HGB 10.4* 9.4* 10.3*  PLT 65* 74* 79*  CREATININE 1.92* 2.12* 1.97*   Estimated Creatinine Clearance: 24.5 mL/min (by C-G formula based on Cr of 1.97). No results for input(s): VANCOTROUGH, VANCOPEAK, VANCORANDOM, GENTTROUGH, GENTPEAK, GENTRANDOM, TOBRATROUGH, TOBRAPEAK, TOBRARND, AMIKACINPEAK, AMIKACINTROU, AMIKACIN in the last 72 hours.   Microbiology: Recent Results (from the past 720 hour(s))  Culture, blood (routine x 2)     Status: None (Preliminary result)   Collection Time: 07/27/15 12:45 AM  Result Value Ref Range Status   Specimen Description BLOOD BLOOD LEFT HAND  Final   Special Requests BOTTLES DRAWN AEROBIC AND ANAEROBIC 5ML  Final   Culture   Final    NO GROWTH 4 DAYS Performed at [facility]    Report Status PENDING  Incomplete  Culture, blood (routine x 2)     Status: None (Preliminary result)   Collection Time: 07/27/15 12:50 AM  Result Value Ref Range Status   Specimen Description BLOOD BLOOD RIGHT FOREARM  Final   Special Requests BOTTLES DRAWN AEROBIC AND ANAEROBIC 10CC  Final   Culture  Setup Time   Final   GRAM NEGATIVE RODS AEROBIC BOTTLE ONLY CRITICAL RESULT CALLED TO, READ BACK BY AND VERIFIED WITH: S GANNONS 07/29/15 @ 61 M VESTAL    Culture   Final    ESCHERICHIA COLI Performed at [facility]    Report Status PENDING  Incomplete  Blood culture (routine x 2)     Status: None (Preliminary result)   Collection Time: 07/29/15  3:55 PM  Result Value Ref Range Status   Specimen Description BLOOD LEFT HAND  Final   Special Requests BOTTLES DRAWN AEROBIC ONLY 5CC  Final   Culture   Final    NO GROWTH 1 DAY Performed at [facility]    Report Status PENDING  Incomplete  Blood culture (routine x 2)     Status: None (Preliminary result)   Collection Time: 07/29/15  4:20 PM  Result Value Ref Range Status   Specimen Description BLOOD RIGHT ARM  Final   Special Requests BOTTLES DRAWN AEROBIC AND ANAEROBIC 10CC EACH  Final   Culture   Final    NO GROWTH 1 DAY Performed at [facility]    Report Status PENDING  Incomplete  Urine culture     Status: None (Preliminary result)   Collection Time: 07/29/15  4:45 PM  Result Value Ref Range Status   Specimen Description URINE, CLEAN CATCH  Final   Special Requests NONE  Final   Culture   Final    CULTURE REINCUBATED FOR BETTER GROWTH Performed at [facility]    Report Status PENDING  Incomplete   Assessment: 85 YOM instructed to return to ED for GNR in 1/2 blood cultures from 8/4. He was seen in ED on 8/4 for fever, chills, N/V and abdominal pain.  Pharmacy consulted to dose Zosyn  8/6 >> zosyn  >>    8/4 blood: E.coli 1/2 (sensitivities pending) 8/6 blood x 2: ngtd 8/6 urine: reincubating  Renal: SCr elevated, CrCl 62ml/min WBC WNL afebrile  Goal of Therapy:  Eradication of infection Dose for indication and for patient-specific parameters  Plan:   Continue Zosyn 3.375gm IV q8h (4hr extended infusions)  F/u E.coli sensitivities and narrow antibiotic spectrum appropriately  Continue to follow  renal function  Peggyann Juba, PharmD, BCPS Pager: [phone]  08/01/2015,8:51 AM

## 2015-08-01 NOTE — Progress Notes (Signed)
On-Call  Services  Made NP aware of patient's  continued elevated blood pressure.

## 2015-08-01 NOTE — Progress Notes (Signed)
PROGRESS NOTE  Nicholas Porter [id number] DOB: September 27, 1930 DOA: 07/29/2015 PCP: Cathlean Cower, MD  HPI/Recap of past 24 hours:  No more shivering episode, mouth blistering start to heal, less pain. bp elevated. Sister and  daughter in room  Assessment/Plan: Active Problems:   Positive blood culture   Bacteremia   Oral mucosa blisters:  8/8  One spot on right buccal mucosa and one spot of right upper lip and one spot on left lower lip, no obvious blister on gum/tongue/oral pharynx.  Better with blistex topical, empiric acyclovir, serum hsa pcr pending, start contact precaution. Will need to finish course of acyclovir.  Ecoli  bacteremia:  Fever subsided, unclear source, ua unremarkable ( but was on doxycycline for cellulitis prior to admission),  denies ab pain, but did report dysuria prior to admission.  Was treated with Zosyn , changed to rocephin due to pansensitive ecoli.  Repeat blood culture no growth so far,  Will need total of 14days of abx, may be discharge if repeat culture no growth. Recommend urology follow up, discussed with patient and family  Thrombocytopenia:  likely could be due to infection, improving,  continue plavix .  ckd III, at baseline, renal dosing meds. ua unremarkable. Recommend outpatient nephrology follow up, discussed with patient and family  H/o CAD s/p MI s/p stent in 2013: stable, no chest pain. Continue plavix/betablocker. imdur resumed on 8/7.  HTN:  continue betablocker ,resumed imdur,  continue hold cozaar. 8/9. bp elevated, d/c ivf, increase imdur, continue hold cozaar.  Dizziness: mild orthostatic vital sign, received ivf . Pt eval  DVT prophylaxis: scd's  Consultants: none  Code Status: DNR/DNI, confirmed with patient and wife  Family Communication: Patient and daughter   Disposition Plan: pending repeat blood culture result   Consultants:  none  Procedures:  none  Antibiotics:  Zosyn from 8/6-8/9  Rocephin from  8/9   Objective: BP 184/65 mmHg  Pulse 67  Temp(Src) 98.1 F (36.7 C) (Oral)  Resp 20  Ht 5\' 7"  (1.702 m)  Wt 60.963 kg (134 lb 6.4 oz)  BMI 21.04 kg/m2  SpO2 100%  Intake/Output Summary (Last 24 hours) at 08/01/15 1245 Last data filed at 08/01/15 1200  Gross per 24 hour  Intake   2430 ml  Output   1115 ml  Net   1315 ml   Filed Weights   07/29/15 2039  Weight: 60.963 kg (134 lb 6.4 oz)    Exam:   General:  NAD  ENT: healing lip and right buccal mucosa blisters,  no obvious blister or mucosa change on gum/tongue/oral pharynx  Cardiovascular: RRR  Respiratory: CTABL  Abdomen: Soft/ND/NT, positive BS  Musculoskeletal: No Edema  Neuro: aaox3, pleasant  Data Reviewed: Basic Metabolic Panel:  Recent Labs Lab 07/27/15 0043 07/29/15 1615 07/30/15 0524 07/31/15 0430 08/01/15 0428  NA 141 139 140 138 141  K 4.2 5.0 4.8 4.5 4.7  CL 114* 110 114* 114* 114*  CO2 19* 22 21* 19* 20*  GLUCOSE 129* 104* 96 95 90  BUN 56* 48* 42* 38* 28*  CREATININE 1.91* 2.03* 1.92* 2.12* 1.97*  CALCIUM 8.9 9.3 8.9 8.6* 8.8*   Liver Function Tests:  Recent Labs Lab 07/27/15 0044 07/29/15 1615 07/30/15 0524 08/01/15 0428  AST 24 24 18 17   ALT 12* 13* 10* 9*  ALKPHOS 79 80 68 55  BILITOT 0.6 0.8 0.7 0.8  PROT 6.3* 7.0 6.0* 5.7*  ALBUMIN 3.4* 3.9 3.3* 2.9*    Recent Labs Lab 07/27/15 0044  LIPASE 34   No results for input(s): AMMONIA in the last 168 hours. CBC:  Recent Labs Lab 07/27/15 0043 07/29/15 1615 07/30/15 0524 07/31/15 0430 08/01/15 0428  WBC 4.1 7.3 7.4 6.0 6.1  NEUTROABS 3.8 5.2  --   --   --   HGB 10.6* 11.8* 10.4* 9.4* 10.3*  HCT 32.5* 35.9* 31.6* 29.6* 31.5*  MCV 88.1 87.3 86.8 87.6 87.3  PLT 65* 85* 65* 74* 79*   Cardiac Enzymes:    Recent Labs Lab 07/29/15 1615  TROPONINI 0.04*   BNP (last 3 results) No results for input(s): BNP in the last 8760 hours.  ProBNP (last 3 results) No results for input(s): PROBNP in the last 8760  hours.  CBG:  Recent Labs Lab 07/29/15 1712  GLUCAP 87    Recent Results (from the past 240 hour(s))  Culture, blood (routine x 2)     Status: None (Preliminary result)   Collection Time: 07/27/15 12:45 AM  Result Value Ref Range Status   Specimen Description BLOOD BLOOD LEFT HAND  Final   Special Requests BOTTLES DRAWN AEROBIC AND ANAEROBIC 5ML  Final   Culture   Final    NO GROWTH 4 DAYS Performed at [facility]    Report Status PENDING  Incomplete  Culture, blood (routine x 2)     Status: None   Collection Time: 07/27/15 12:50 AM  Result Value Ref Range Status   Specimen Description BLOOD BLOOD RIGHT FOREARM  Final   Special Requests BOTTLES DRAWN AEROBIC AND ANAEROBIC 10CC  Final   Culture  Setup Time   Final    GRAM NEGATIVE RODS AEROBIC BOTTLE ONLY CRITICAL RESULT CALLED TO, READ BACK BY AND VERIFIED WITH: S GANNONS 07/29/15 @ 68 M VESTAL    Culture   Final    ESCHERICHIA COLI Performed at [facility]    Report Status 08/01/2015 FINAL  Final   Organism ID, Bacteria ESCHERICHIA COLI  Final      Susceptibility   Escherichia coli - MIC*    AMPICILLIN 4 SENSITIVE Sensitive     CEFAZOLIN <=4 SENSITIVE Sensitive     CEFEPIME <=1 SENSITIVE Sensitive     CEFTAZIDIME <=1 SENSITIVE Sensitive     CEFTRIAXONE <=1 SENSITIVE Sensitive     CIPROFLOXACIN <=0.25 SENSITIVE Sensitive     GENTAMICIN <=1 SENSITIVE Sensitive     IMIPENEM <=0.25 SENSITIVE Sensitive     TRIMETH/SULFA <=20 SENSITIVE Sensitive     AMPICILLIN/SULBACTAM <=2 SENSITIVE Sensitive     PIP/TAZO <=4 SENSITIVE Sensitive     * ESCHERICHIA COLI  Blood culture (routine x 2)     Status: None (Preliminary result)   Collection Time: 07/29/15  3:55 PM  Result Value Ref Range Status   Specimen Description BLOOD LEFT HAND  Final   Special Requests BOTTLES DRAWN AEROBIC ONLY 5CC  Final   Culture   Final    NO GROWTH 1 DAY Performed at [facility]    Report Status PENDING  Incomplete   Blood culture (routine x 2)     Status: None (Preliminary result)   Collection Time: 07/29/15  4:20 PM  Result Value Ref Range Status   Specimen Description BLOOD RIGHT ARM  Final   Special Requests BOTTLES DRAWN AEROBIC AND ANAEROBIC 10CC EACH  Final   Culture   Final    NO GROWTH 1 DAY Performed at [facility]    Report Status PENDING  Incomplete  Urine culture  Status: None   Collection Time: 07/29/15  4:45 PM  Result Value Ref Range Status   Specimen Description URINE, CLEAN CATCH  Final   Special Requests NONE  Final   Culture   Final    50,000 COLONIES/mL MULTIPLE SPECIES PRESENT, SUGGEST RECOLLECTION Performed at [facility]    Report Status 08/01/2015 FINAL  Final     Studies: No results found.  Scheduled Meds: . acyclovir  400 mg Oral 5 X Daily  . antiseptic oral rinse  7 mL Mouth Rinse BID  . brinzolamide  1 drop Left Eye Q12H  . cefTRIAXone (ROCEPHIN)  IV  2 g Intravenous Q24H  . clopidogrel  75 mg Oral Daily  . [START ON 08/02/2015] isosorbide mononitrate  60 mg Oral Daily  . latanoprost  1 drop Both Eyes QHS  . metoprolol succinate  25 mg Oral Daily  . pantoprazole  40 mg Oral Daily    Continuous Infusions:    Time spent: 3mins  Breannah Kratt MD, PhD  [facility] Pager [phone]. If 7PM-7AM, please contact night-coverage at [email or web address], password [facility] 08/01/2015, 12:45 PM  LOS: 3 days

## 2015-08-02 ENCOUNTER — Ambulatory Visit: Payer: Medicare Other | Admitting: Internal Medicine

## 2015-08-02 ENCOUNTER — Telehealth (HOSPITAL_COMMUNITY): Payer: Self-pay

## 2015-08-02 DIAGNOSIS — R7881 Bacteremia: Principal | ICD-10-CM

## 2015-08-02 DIAGNOSIS — I10 Essential (primary) hypertension: Secondary | ICD-10-CM

## 2015-08-02 DIAGNOSIS — I2510 Atherosclerotic heart disease of native coronary artery without angina pectoris: Secondary | ICD-10-CM

## 2015-08-02 DIAGNOSIS — N183 Chronic kidney disease, stage 3 (moderate): Secondary | ICD-10-CM

## 2015-08-02 LAB — CBC
HCT: 29.9 % — ABNORMAL LOW (ref 39.0–52.0)
Hemoglobin: 9.5 g/dL — ABNORMAL LOW (ref 13.0–17.0)
MCH: 27.6 pg (ref 26.0–34.0)
MCHC: 31.8 g/dL (ref 30.0–36.0)
MCV: 86.9 fL (ref 78.0–100.0)
Platelets: 73 10*3/uL — ABNORMAL LOW (ref 150–400)
RBC: 3.44 MIL/uL — ABNORMAL LOW (ref 4.22–5.81)
RDW: 14.7 % (ref 11.5–15.5)
WBC: 6 10*3/uL (ref 4.0–10.5)

## 2015-08-02 LAB — BASIC METABOLIC PANEL
Anion gap: 10 (ref 5–15)
BUN: 21 mg/dL — AB (ref 6–20)
CALCIUM: 8.5 mg/dL — AB (ref 8.9–10.3)
CO2: 17 mmol/L — ABNORMAL LOW (ref 22–32)
Chloride: 114 mmol/L — ABNORMAL HIGH (ref 101–111)
Creatinine, Ser: 1.81 mg/dL — ABNORMAL HIGH (ref 0.61–1.24)
GFR calc Af Amer: 38 mL/min — ABNORMAL LOW (ref >60)
GFR calc non Af Amer: 33 mL/min — ABNORMAL LOW (ref >60)
GLUCOSE: 89 mg/dL (ref 65–99)
POTASSIUM: 4 mmol/L (ref 3.5–5.1)
SODIUM: 141 mmol/L (ref 135–145)

## 2015-08-02 LAB — HERPES SIMPLEX VIRUS(HSV) DNA BY PCR
HSV 1 DNA: POSITIVE — AB
HSV 2 DNA: NEGATIVE

## 2015-08-02 MED ORDER — SODIUM BICARBONATE 650 MG PO TABS
650.0000 mg | ORAL_TABLET | Freq: Three times a day (TID) | ORAL | Status: DC
  Administered 2015-08-02 – 2015-08-03 (×4): 650 mg via ORAL
  Filled 2015-08-02 (×4): qty 1

## 2015-08-02 MED ORDER — CEFPODOXIME PROXETIL 200 MG PO TABS
200.0000 mg | ORAL_TABLET | Freq: Two times a day (BID) | ORAL | Status: DC
  Administered 2015-08-02 – 2015-08-03 (×3): 200 mg via ORAL
  Filled 2015-08-02 (×4): qty 1

## 2015-08-02 NOTE — Progress Notes (Signed)
[facility] PROGRESS NOTE  Nicholas Porter [id number] DOB: Jan 29, 1930 DOA: 07/29/2015  PCP: Cathlean Cower, MD  Brief HPI: 81 year old Caucasian male with a past medical history of coronary artery disease, hypertension, COPD, who initially presented to the emergency department with fevers and chills. He was discharged home. Subsequently, was called back due to positive blood cultures. He was subsequently hospitalized.  Past medical history:  Past Medical History  Diagnosis Date  . CAD in native artery 2001; 2011; 2014; 05/2014    a) 2001 & 2011 -- Med Rx for: 60-70% ostial Cx-OM lesion, 40-50% lesions in the LAD and RC; b) Persantine Myoview 2012: No ischemia or infarction, EF 66% c) 10/'14: RCA 100% - PCI,CxOM stable, LAD ~70% mid; d) UA 6/'15: 75% ISR in RCA - PTCA, LAD lesion ~40%, CxOM stable.    . ST elevation myocardial infarction (STEMI) of inferior wall, emergent PCI with Promus DES to RCA 10/05/2013    100% mRCA occlusion; residual 80% OM2, ~70% LAD --> PCI to RCA  . CAD S/P percutaneous coronary angioplasty 10/05/2013; 05/2014    a) 10/'14: PCI to RCA - Promus Premier DES 3.0 mm x 28 mm to mRCA (~3.19 mm);; b) 6/'15: Cut[place] Balloon PTCA of 75 % RCA ISR  . Moderate aortic insufficiency February 2012    Echocardiogram: EF > 55%; impaired relaxation. Mild/moderate MAC with mild MR; Moderate Aortic Regurgitation and mild sclerosis/ NO stenosis; mild - mod Pulm HTN (40-46mmHg);; Follow-up 05/2013: EF 65-70%, PAP ~31 mmH, otherwise no change  . Mild aortic stenosis  10/05/2013    Echo post STEMI: EF 55-60%. No regional W. MA. Mild aortic stenosis with mild regurgitation. MAC. Moderate pulmonary hypertension, 58 mmHg.  . Carotid artery stenosis  December 2012    s/p L CEA  . Hypertension, benign   . Hyperlipidemia LDL goal <70   . CKD (chronic kidney disease), stage III   . COPD (chronic obstructive pulmonary disease)  12/07/2011  . Thrombocytopenia, acquired 06/30/2007  . Anemia,  B12 deficiency 11/04/2008  . Personal history of peptic ulcer disease 06/30/2007  . GERD (gastroesophageal reflux disease) 06/30/2007  . Irritable bowel syndrome (IBS)  12/08/2008  . Gallstones  12/07/2011  . Hernia, hiatal  08/25/2008  . 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 History of colonic polyps 08/25/2008  . Diverticulosis of colon 12/08/2008  . Osteoarthritis 05/22/2008; 06/30/2007     with chronic upper and lumbar back pain as well as neck pain.  . Chronic back pain 12/06/2011  . Neck pain, chronic 12/06/2011  . Osteopenia 10/18/2008  . Hypothyroidism 10/18/2008  . Finger amputation, traumatic   . Personal history of TIA (transient ischemic attack) 2007  . GERD (gastroesophageal reflux disease) 01/24/2015    Consultants: None  Procedures: None  Antibiotics: Initially on Zosyn. Changed to ceftriaxone. Now change to Vantin.  Subjective: Patient denies any complaints. His wife is at bedside. Normal diarrhea. No dysuria. No flank pains. He was on antibiotics for on cellulitis, which has improved.  Objective: Vital Signs  Filed Vitals:   08/01/15 0450 08/01/15 1315 08/01/15 2036 08/02/15 0622  BP: 184/65 118/57 180/53 165/76  Pulse: 67 53 65 67  Temp: 98.1 F (36.7 C) 98.2 F (36.8 C) 98.2 F (36.8 C) 98.1 F (36.7 C)  TempSrc: Oral Oral Oral Oral  Resp: 20 18 18 18   Height:      Weight:      SpO2: 100% 100% 100% 100%    Intake/Output Summary (Last 24 hours) at 08/02/15  [place] filed at 08/01/15 1835  Gross per 24 hour  Intake    240 ml  Output    250 ml  Net    -10 ml   Filed Weights   07/29/15 2039  Weight: 60.963 kg (134 lb 6.4 oz)    General appearance: alert, cooperative, appears stated age and no distress Resp: clear to auscultation bilaterally Cardio: regular rate and rhythm, S1, S2 normal, no murmur, click, rub or gallop GI: soft, non-tender; bowel sounds normal; no masses,  no organomegaly Extremities: extremities normal, atraumatic, no  cyanosis or edema Neurologic: No focal deficits.  Lab Results:  Basic Metabolic Panel:  Recent Labs Lab 07/29/15 1615 07/30/15 0524 07/31/15 0430 08/01/15 0428 08/02/15 0501  NA 139 140 138 141 141  K 5.0 4.8 4.5 4.7 4.0  CL 110 114* 114* 114* 114*  CO2 22 21* 19* 20* 17*  GLUCOSE 104* 96 95 90 89  BUN 48* 42* 38* 28* 21*  CREATININE 2.03* 1.92* 2.12* 1.97* 1.81*  CALCIUM 9.3 8.9 8.6* 8.8* 8.5*   Liver Function Tests:  Recent Labs Lab 07/27/15 0044 07/29/15 1615 07/30/15 0524 08/01/15 0428  AST 24 24 18 17   ALT 12* 13* 10* 9*  ALKPHOS 79 80 68 55  BILITOT 0.6 0.8 0.7 0.8  PROT 6.3* 7.0 6.0* 5.7*  ALBUMIN 3.4* 3.9 3.3* 2.9*    Recent Labs Lab 07/27/15 0044  LIPASE 34   CBC:  Recent Labs Lab 07/27/15 0043 07/29/15 1615 07/30/15 0524 07/31/15 0430 08/01/15 0428 08/02/15 0501  WBC 4.1 7.3 7.4 6.0 6.1 6.0  NEUTROABS 3.8 5.2  --   --   --   --   HGB 10.6* 11.8* 10.4* 9.4* 10.3* 9.5*  HCT 32.5* 35.9* 31.6* 29.6* 31.5* 29.9*  MCV 88.1 87.3 86.8 87.6 87.3 86.9  PLT 65* 85* 65* 74* 79* 73*   Cardiac Enzymes:  Recent Labs Lab 07/29/15 1615  TROPONINI 0.04*    CBG:  Recent Labs Lab 07/29/15 1712  GLUCAP 87    Recent Results (from the past 240 hour(s))  Culture, blood (routine x 2)     Status: None   Collection Time: 07/27/15 12:45 AM  Result Value Ref Range Status   Specimen Description BLOOD BLOOD LEFT HAND  Final   Special Requests BOTTLES DRAWN AEROBIC AND ANAEROBIC 5ML  Final   Culture   Final    NO GROWTH 5 DAYS Performed at [facility]    Report Status 08/01/2015 FINAL  Final  Culture, blood (routine x 2)     Status: None   Collection Time: 07/27/15 12:50 AM  Result Value Ref Range Status   Specimen Description BLOOD BLOOD RIGHT FOREARM  Final   Special Requests BOTTLES DRAWN AEROBIC AND ANAEROBIC 10CC  Final   Culture  Setup Time   Final    GRAM NEGATIVE RODS AEROBIC BOTTLE ONLY CRITICAL RESULT CALLED TO, READ BACK BY  AND VERIFIED WITH: S GANNONS 07/29/15 @ [place]    Culture   Final    ESCHERICHIA COLI Performed at [facility]    Report Status 08/01/2015 FINAL  Final   Organism ID, Bacteria ESCHERICHIA COLI  Final      Susceptibility   Escherichia coli - MIC*    AMPICILLIN 4 SENSITIVE Sensitive     CEFAZOLIN <=4 SENSITIVE Sensitive     CEFEPIME <=1 SENSITIVE Sensitive     CEFTAZIDIME <=1 SENSITIVE Sensitive     CEFTRIAXONE <=1 SENSITIVE Sensitive  CIPROFLOXACIN <=0.25 SENSITIVE Sensitive     GENTAMICIN <=1 SENSITIVE Sensitive     IMIPENEM <=0.25 SENSITIVE Sensitive     TRIMETH/SULFA <=20 SENSITIVE Sensitive     AMPICILLIN/SULBACTAM <=2 SENSITIVE Sensitive     PIP/TAZO <=4 SENSITIVE Sensitive     * ESCHERICHIA COLI  Blood culture (routine x 2)     Status: None (Preliminary result)   Collection Time: 07/29/15  3:55 PM  Result Value Ref Range Status   Specimen Description BLOOD LEFT HAND  Final   Special Requests BOTTLES DRAWN AEROBIC ONLY 5CC  Final   Culture   Final    NO GROWTH 2 DAYS Performed at [facility]    Report Status PENDING  Incomplete  Blood culture (routine x 2)     Status: None (Preliminary result)   Collection Time: 07/29/15  4:20 PM  Result Value Ref Range Status   Specimen Description BLOOD RIGHT ARM  Final   Special Requests BOTTLES DRAWN AEROBIC AND ANAEROBIC 10CC EACH  Final   Culture   Final    NO GROWTH 2 DAYS Performed at [facility]    Report Status PENDING  Incomplete  Urine culture     Status: None   Collection Time: 07/29/15  4:45 PM  Result Value Ref Range Status   Specimen Description URINE, CLEAN CATCH  Final   Special Requests NONE  Final   Culture   Final    50,000 COLONIES/mL MULTIPLE SPECIES PRESENT, SUGGEST RECOLLECTION Performed at [facility]    Report Status 08/01/2015 FINAL  Final      Studies/Results: No results found.  Medications:  Scheduled: . acyclovir  400 mg Oral 5 X Daily  . antiseptic  oral rinse  7 mL Mouth Rinse BID  . brinzolamide  1 drop Left Eye Q12H  . cefpodoxime  200 mg Oral Q12H  . clopidogrel  75 mg Oral Daily  . isosorbide mononitrate  60 mg Oral Daily  . latanoprost  1 drop Both Eyes QHS  . metoprolol succinate  25 mg Oral Daily  . pantoprazole  40 mg Oral Daily  . sodium bicarbonate  650 mg Oral TID   Continuous:  [id number], lip balm  Assessment/Plan:  Active Problems:   Positive blood culture   Bacteremia    Escherichia coli bacteremia  Symptomatically, patient has improved. Fever has subsided. Source is unclear. He did have chills and fever initially. Could've had pyelonephritis, although his UA was clear. But he was on antibiotics at that time. We will change him to oral antibiotics today. Peak cultures are negative so far.  Oral mucosa blisters Continue with acyclovir. Stable.  Chronic kidney disease stage III with Normal anion gap metabolic acidosis Did have some acute renal failure at presentation. This has improved. Monitor urine output. Initiate sodium bicarbonate.  Thrombocytopenia Stable. No overt bleeding.  History of coronary artery disease status post MI and stent placement in 2013 Stable. Continue antiplatelets agents and beta blocker.  History of essential hypertension Monitor blood pressures closely. Continue anti-hypertensive agents.  Normocytic anemia Hemoglobin is stable. No overt bleeding.  DVT Prophylaxis: SCDs    Code Status: DO NOT RESUSCITATE  Family Communication: Discussed with the patient and his wife  Disposition Plan: Mobilize. Possible discharge tomorrow.   LOS: 4 days   [facility] Pager [phone] 08/02/2015, 9:42 AM  If 7PM-7AM, please contact night-coverage at [email or web address], password [facility]

## 2015-08-02 NOTE — Progress Notes (Signed)
OT Cancellation Note  Patient Details Name: Nicholas Porter MRN: [id number] DOB: 1930/07/25   Cancelled Treatment:    Reason Eval/Treat Not Completed: Other (comment).  Pt does not feel that he needs OT.  He walked earlier with PT and staff and was able to don shoes independently with PT.  Wife is home with pt and he will stay on first floor and sponge bathe.  Will sign off.  Machaela Caterino 08/02/2015, 3:50 PM  Lesle Chris, OTR/L [phone] 08/02/2015

## 2015-08-02 NOTE — Telephone Encounter (Signed)
Post ED Visit - Positive Culture Follow-up  Culture report reviewed by antimicrobial stewardship pharmacist: []  Wes Dulaney, Pharm.D., BCPS []  Heide Guile, Pharm.D., BCPS []  Alycia Rossetti, Pharm.D., BCPS []  [place], Pharm.D., BCPS, AAHIVP []  Legrand Como, Pharm.D., BCPS, AAHIVP []  Isac Sarna, Pharm.D., BCPS X  [place],T Pharm D  Positive Blood culture -> E Coli  Now admitted to [facility] and on ceftriaxone  Dortha [name] 08/02/2015, 11:46 AM

## 2015-08-03 LAB — CBC
HCT: 29.3 % — ABNORMAL LOW (ref 39.0–52.0)
Hemoglobin: 9.2 g/dL — ABNORMAL LOW (ref 13.0–17.0)
MCH: 27.3 pg (ref 26.0–34.0)
MCHC: 31.4 g/dL (ref 30.0–36.0)
MCV: 86.9 fL (ref 78.0–100.0)
PLATELETS: 93 10*3/uL — AB (ref 150–400)
RBC: 3.37 MIL/uL — AB (ref 4.22–5.81)
RDW: 14.8 % (ref 11.5–15.5)
WBC: 6 10*3/uL (ref 4.0–10.5)

## 2015-08-03 LAB — HSV(HERPES SMPLX)ABS-I+II(IGG+IGM)-BLD
HSV 2 Glycoprotein G Ab, IgG: <0.91 index (ref 0.00–0.90)
HSVI/II Comb IgM: <0.91 Ratio (ref 0.00–0.90)

## 2015-08-03 LAB — BASIC METABOLIC PANEL
Anion gap: 6 (ref 5–15)
BUN: 19 mg/dL (ref 6–20)
CO2: 21 mmol/L — ABNORMAL LOW (ref 22–32)
CREATININE: 1.7 mg/dL — AB (ref 0.61–1.24)
Calcium: 8.7 mg/dL — ABNORMAL LOW (ref 8.9–10.3)
Chloride: 114 mmol/L — ABNORMAL HIGH (ref 101–111)
GFR calc Af Amer: 41 mL/min — ABNORMAL LOW (ref >60)
GFR, EST NON AFRICAN AMERICAN: 35 mL/min — AB (ref >60)
Glucose, Bld: 91 mg/dL (ref 65–99)
POTASSIUM: 4 mmol/L (ref 3.5–5.1)
Sodium: 141 mmol/L (ref 135–145)

## 2015-08-03 MED ORDER — CEFPODOXIME PROXETIL 200 MG PO TABS: 200.0000 mg | ORAL_TABLET | Freq: Two times a day (BID) | ORAL | Status: DC

## 2015-08-03 MED ORDER — SODIUM BICARBONATE 650 MG PO TABS: 650.0000 mg | ORAL_TABLET | Freq: Three times a day (TID) | ORAL | Status: DC

## 2015-08-03 MED ORDER — ACYCLOVIR 400 MG PO TABS: 400.0000 mg | ORAL_TABLET | Freq: Every day | ORAL | Status: DC

## 2015-08-03 NOTE — Discharge Instructions (Signed)
Bacteremia °Bacteremia occurs when bacteria get in your blood. Normal blood does not usually have bacteria. Bacteremia is one way infections can spread from one part of the body to another. °CAUSES  °· Causes may include anything that allows bacteria to get into the body. Examples are: °¨ Catheters. °¨ Intravenous (IV) access tubes. °¨ Cuts or scrapes of the skin. °· Temporary bacteremia may occur during dental procedures, while brushing your teeth, or during a bowel movement. This rarely causes any symptoms or medical problems. °· Bacteria may also get in the bloodstream as a complication of a bacterial infection elsewhere. This includes infected wounds and bacterial infections of the: °¨ Lungs (pneumonia). °¨ Kidneys (pyelonephritis). °¨ Intestines (enteritis, colitis). °¨ Organs in the abdomen (appendicitis, cholecystitis, diverticulitis). °SYMPTOMS  °The body is usually able to clear small numbers of bacteria out of the blood quickly. Brief bacteremia usually does not cause problems.  °· Problems can occur if the bacteria start to grow in number or spread to other parts of the body. If the bacteria start growing, you may develop: °¨ Chills. °¨ Fever. °¨ Nausea. °¨ Vomiting. °¨ Sweating. °¨ Lightheadedness and low blood pressure. °¨ Pain. °· If bacteria start to grow in the linings around the brain, it is called meningitis. This can cause severe headaches, many other problems, and even death. °· If bacteria start to grow in a joint, it causes arthritis with painful joints. If bacteria start to grow in a bone, it is called osteomyelitis. °· Bacteria from the blood can also cause sores (abscesses) in many organs, such as the muscle, liver, spleen, lungs, brain, and kidneys. °DIAGNOSIS  °· This condition is diagnosed by cultures of the blood. °· Cultures may also be taken from other parts of the body that are thought to be causing the bacteremia. A small piece of tissue, fluid, or other product of the body is  sampled. The sample is then put on a growth plate to see if any bacteria grows. °· Other lab tests may be done and the results may be abnormal. °TREATMENT  °Treatment requires a stay in the hospital. You will be given antibiotic medicine through an IV access tube. °PREVENTION  °People with an increased risk of developing bacteremia or complications may be given antibiotics before certain procedures. Examples are: °· A person with a heart murmur or artificial heart valve, before having his or her teeth cleaned. °· Before having a surgical or other invasive procedure. °· Before having a bowel procedure. °Document Released: 09/22/2006 Document Revised: 03/02/2012 Document Reviewed: 07/04/2011 °ExitCare® Patient Information ©2015 [employer]. This information is not intended to replace advice given to you by your health care provider. Make sure you discuss any questions you have with your health care provider. ° °

## 2015-08-03 NOTE — Care Management Important Message (Signed)
Important Message  Patient Details  Name: Nicholas Porter MRN: [id number] Date of Birth: May 18, 1930   Medicare Important Message Given:  Yes-third notification given    Camillo Flaming 08/03/2015, 1:07 [place] Message  Patient Details  Name: Nicholas Porter MRN: [id number] Date of Birth: 21-Nov-1930   Medicare Important Message Given:  Yes-third notification given    Camillo Flaming 08/03/2015, 1:07 PM

## 2015-08-03 NOTE — Discharge Summary (Signed)
[facility]  Physician Discharge Summary   Patient ID: Nicholas Porter MRN: [id number] DOB/AGE: 79/01/1930 79 y.o.  Admit date: 07/29/2015 Discharge date: 08/03/2015  PCP: Cathlean Cower, MD  DISCHARGE DIAGNOSES:  Active Problems:   Positive blood culture   Bacteremia   RECOMMENDATIONS FOR OUTPATIENT FOLLOW UP: Patient discharged on oral Vantin. Monitor platelet counts and renal function as an outpatient.  DISCHARGE CONDITION: fair  Diet recommendation: as before  [facility] Weights   07/29/15 2039  Weight: 60.963 kg (134 lb 6.4 oz)    INITIAL HISTORY: 81 year old Caucasian male with a past medical history of coronary artery disease, hypertension, COPD, who initially presented to the emergency department with fevers and chills. He was discharged home. Subsequently, was called back due to positive blood cultures. He was subsequently hospitalized.   HOSPITAL COURSE:   Escherichia coli bacteremia  Symptomatically, patient has improved. Fever has subsided. Source of his bacteremia is unclear. He did have chills and fever initially. Could've had pyelonephritis, although his UA was clear. But he was on antibiotics at that time. Patient was changed over to oral antibiotics. Repeat cultures are negative so far.   Oral mucosa blisters/possible herpes Continue with acyclovir. 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 This has improved. Continue sodium bicarbonate for few more days.  Thrombocytopenia Stable. No overt bleeding. Monitor as outpatient  History of coronary artery disease status post MI and stent placement in 2013 Stable. Continue antiplatelets agents and beta blocker.  History of essential hypertension Continue home medication regimen  Normocytic anemia Hemoglobin is stable. No overt bleeding.  Patient feels much better. Has worked with physical therapy. Wants  to go home today. Okay for discharge.   PERTINENT LABS:  The results of significant diagnostics from this hospitalization (including imaging, microbiology, ancillary and laboratory) are listed below for reference.    Microbiology: Recent Results (from the past 240 hour(s))  Culture, blood (routine x 2)     Status: None   Collection Time: 07/27/15 12:45 AM  Result Value Ref Range Status   Specimen Description BLOOD BLOOD LEFT HAND  Final   Special Requests BOTTLES DRAWN AEROBIC AND ANAEROBIC 5ML  Final   Culture   Final    NO GROWTH 5 DAYS Performed at [facility]    Report Status 08/01/2015 FINAL  Final  Culture, blood (routine x 2)     Status: None   Collection Time: 07/27/15 12:50 AM  Result Value Ref Range Status   Specimen Description BLOOD BLOOD RIGHT FOREARM  Final   Special Requests BOTTLES DRAWN AEROBIC AND ANAEROBIC 10CC  Final   Culture  Setup Time   Final    GRAM NEGATIVE RODS AEROBIC BOTTLE ONLY CRITICAL RESULT CALLED TO, READ BACK BY AND VERIFIED WITH: S GANNONS 07/29/15 @ 71 M VESTAL    Culture   Final    ESCHERICHIA COLI Performed at [facility]    Report Status 08/01/2015 FINAL  Final   Organism ID, Bacteria ESCHERICHIA COLI  Final      Susceptibility   Escherichia coli - MIC*    AMPICILLIN 4 SENSITIVE Sensitive     CEFAZOLIN <=4 SENSITIVE Sensitive     CEFEPIME <=1 SENSITIVE Sensitive     CEFTAZIDIME <=1 SENSITIVE Sensitive     CEFTRIAXONE <=1 SENSITIVE Sensitive     CIPROFLOXACIN <=0.25 SENSITIVE Sensitive     GENTAMICIN <=1 SENSITIVE Sensitive  IMIPENEM <=0.25 SENSITIVE Sensitive     TRIMETH/SULFA <=20 SENSITIVE Sensitive     AMPICILLIN/SULBACTAM <=2 SENSITIVE Sensitive     PIP/TAZO <=4 SENSITIVE Sensitive     * ESCHERICHIA COLI  Blood culture (routine x 2)     Status: None (Preliminary result)   Collection Time: 07/29/15  3:55 PM  Result Value Ref Range Status   Specimen Description BLOOD LEFT HAND  Final   Special Requests  BOTTLES DRAWN AEROBIC ONLY 5CC  Final   Culture   Final    NO GROWTH 4 DAYS Performed at [facility]    Report Status PENDING  Incomplete  Blood culture (routine x 2)     Status: None (Preliminary result)   Collection Time: 07/29/15  4:20 PM  Result Value Ref Range Status   Specimen Description BLOOD RIGHT ARM  Final   Special Requests BOTTLES DRAWN AEROBIC AND ANAEROBIC 10CC EACH  Final   Culture   Final    NO GROWTH 4 DAYS Performed at [facility]    Report Status PENDING  Incomplete  Urine culture     Status: None   Collection Time: 07/29/15  4:45 PM  Result Value Ref Range Status   Specimen Description URINE, CLEAN CATCH  Final   Special Requests NONE  Final   Culture   Final    50,000 COLONIES/mL MULTIPLE SPECIES PRESENT, SUGGEST RECOLLECTION Performed at [facility]    Report Status 08/01/2015 FINAL  Final     Labs: Basic Metabolic Panel:  Recent Labs Lab 07/30/15 0524 07/31/15 0430 08/01/15 0428 08/02/15 0501 08/03/15 0503  NA 140 138 141 141 141  K 4.8 4.5 4.7 4.0 4.0  CL 114* 114* 114* 114* 114*  CO2 21* 19* 20* 17* 21*  GLUCOSE 96 95 90 89 91  BUN 42* 38* 28* 21* 19  CREATININE 1.92* 2.12* 1.97* 1.81* 1.70*  CALCIUM 8.9 8.6* 8.8* 8.5* 8.7*   Liver Function Tests:  Recent Labs Lab 07/29/15 1615 07/30/15 0524 08/01/15 0428  AST 24 18 17   ALT 13* 10* 9*  ALKPHOS 80 68 55  BILITOT 0.8 0.7 0.8  PROT 7.0 6.0* 5.7*  ALBUMIN 3.9 3.3* 2.9*   CBC:  Recent Labs Lab 07/29/15 1615 07/30/15 0524 07/31/15 0430 08/01/15 0428 08/02/15 0501 08/03/15 0503  WBC 7.3 7.4 6.0 6.1 6.0 6.0  NEUTROABS 5.2  --   --   --   --   --   HGB 11.8* 10.4* 9.4* 10.3* 9.5* 9.2*  HCT 35.9* 31.6* 29.6* 31.5* 29.9* 29.3*  MCV 87.3 86.8 87.6 87.3 86.9 86.9  PLT 85* 65* 74* 79* 73* 93*   Cardiac Enzymes:  Recent Labs Lab 07/29/15 1615  TROPONINI 0.04*    CBG:  Recent Labs Lab 07/29/15 1712  GLUCAP 87     IMAGING STUDIES Ct  Abdomen Pelvis Wo Contrast  07/27/2015   CLINICAL DATA:  Nausea vomiting and chills.  EXAM: CT ABDOMEN AND PELVIS WITHOUT CONTRAST  TECHNIQUE: Multidetector CT imaging of the abdomen and pelvis was performed following the standard protocol without IV contrast.  COMPARISON:  11/27/2008  FINDINGS: Lower chest:  No significant abnormality  Hepatobiliary: Cholelithiasis. Unremarkable unenhanced appearances of the liver. Bile ducts unremarkable.  Pancreas: Unremarkable  Spleen: Under  Adrenals/Urinary Tract: The adrenals and kidneys are remarkable only for mild renal atrophy bilaterally. There is no urinary calculus. There is no hydronephrosis or ureteral dilatation. Urinary bladder is unremarkable.  Stomach/Bowel: Surgical clips around the stomach. Otherwise  unremarkable appearances of the stomach and small bowel. Appendix is normal. There is extensive colonic diverticulosis. There is no evidence of diverticulitis or other acute inflammatory process involving the bowel. There is no bowel obstruction. There is no extraluminal air.  Vascular/Lymphatic: The abdominal aorta is normal in caliber. There is extent atherosclerotic calcification. There is no adenopathy in the abdomen or pelvis.  Reproductive: Moderate prostate enlargement with elevation of the bladder base.  Other: No ascites. No acute findings are evident in the abdomen or pelvis.  Musculoskeletal: Moderately severe degenerative disc changes at L5-S1. There also is moderately severe facet arthritis at L4-5 and L5-S1 on the right. There is grade 1 spondylolisthesis at L5-S1.  IMPRESSION: 1. No acute findings are evident in the abdomen or pelvis. 2. Cholelithiasis 3. Diverticulosis 4. Severe lumbar degenerative disc and facet changes.   Electronically Signed   By: Andreas Newport M.D.   On: 07/27/2015 04:29   Dg Chest 2 View  07/29/2015   CLINICAL DATA:  Positive blood cultures. Weakness. Elevated blood pressure today. Initial encounter.  EXAM: CHEST  2 VIEW   COMPARISON:  Radiographs 07/27/2015 and 06/07/2014.  FINDINGS: The heart size and mediastinal contours are stable. Aortic atherosclerosis and a coronary artery stent are noted. The lungs are clear. There is no pleural effusion or pneumothorax. The bones appear unchanged. Multiple telemetry leads overlie the chest.  IMPRESSION: Stable chest.  No acute cardiopulmonary process.   Electronically Signed   By: Richardean Sale M.D.   On: 07/29/2015 17:11   Dg Chest 2 View  07/27/2015   CLINICAL DATA:  Acute onset of fever, chills, vomiting and weakness. Initial encounter.  EXAM: CHEST  2 VIEW  COMPARISON:  Chest radiograph performed 06/07/2014  FINDINGS: The lungs are well-aerated. Minimal bilateral atelectasis is noted. There is no evidence of pleural effusion or pneumothorax.  The heart is normal in size; the mediastinal contour is within normal limits. No acute osseous abnormalities are seen. Scattered clips are seen about the gastroesophageal junction.  IMPRESSION: Minimal bilateral atelectasis noted; lungs otherwise clear.   Electronically Signed   By: Garald Balding M.D.   On: 07/27/2015 01:24    DISCHARGE EXAMINATION: Filed Vitals:   08/02/15 0622 08/02/15 1344 08/02/15 2236 08/03/15 1100  BP: 165/76 136/58 184/57 152/54  Pulse: 67 66 61 60  Temp: 98.1 F (36.7 C) 97.6 F (36.4 C) 97.2 F (36.2 C)   TempSrc: Oral Oral Oral   Resp: 18 18 18    Height:      Weight:      SpO2: 100% 100% 100%    General appearance: alert, cooperative, appears stated age and no distress Resp: clear to auscultation bilaterally Cardio: regular rate and rhythm, S1, S2 normal, no murmur, click, rub or gallop GI: soft, non-tender; bowel sounds normal; no masses,  no organomegaly Extremities: extremities normal, atraumatic, no cyanosis or edema  DISPOSITION: Home  Discharge Instructions    Call MD for:  difficulty breathing, headache or visual disturbances    Complete by:  As directed      Call MD for:  extreme  fatigue    Complete by:  As directed      Call MD for:  persistant dizziness or light-headedness    Complete by:  As directed      Call MD for:  persistant nausea and vomiting    Complete by:  As directed      Call MD for:  severe uncontrolled pain    Complete by:  As directed      Call MD for:  temperature >100.4    Complete by:  As directed      Diet - low sodium heart healthy    Complete by:  As directed      Discharge instructions    Complete by:  As directed   Please follow up with your PCP in 7-10 days. Complete the course of all the antibiotics. You will need blood work to check your kidneys at follow up.  You were cared for by a hospitalist during your hospital stay. If you have any questions about your discharge medications or the care you received while you were in the hospital after you are discharged, you can call the unit and asked to speak with the hospitalist on call if the hospitalist that took care of you is not available. Once you are discharged, your primary care physician will handle any further medical issues. Please note that NO REFILLS for any discharge medications will be authorized once you are discharged, as it is imperative that you return to your primary care physician (or establish a relationship with a primary care physician if you do not have one) for your aftercare needs so that they can reassess your need for medications and monitor your lab values. If you do not have a primary care physician, you can call [phone] for a physician referral.     Increase activity slowly    Complete by:  As directed            ALLERGIES:  Allergies  Allergen Reactions  . Amlodipine Besylate Hives  . Hydralazine Itching  . Lipitor [Atorvastatin] Itching  . Naproxen Diarrhea  . Oxycodone-Acetaminophen Other (See Comments)    REACTION: unspecified     Discharge Medication List as of 08/03/2015 11:36 AM    START taking these medications   Details  acyclovir (ZOVIRAX)  400 MG tablet Take 1 tablet (400 mg total) by mouth 5 (five) times daily. For 5 more days, Starting 08/03/2015, Until Discontinued, Normal    cefpodoxime (VANTIN) 200 MG tablet Take 1 tablet (200 mg total) by mouth every 12 (twelve) hours. For 9 more days, Starting 08/03/2015, Until Discontinued, Normal    sodium bicarbonate 650 MG tablet Take 1 tablet (650 mg total) by mouth 3 (three) times daily. For 7 more days, Starting 08/03/2015, Until Discontinued, Normal      CONTINUE these medications which have NOT CHANGED   Details  brinzolamide (AZOPT) 1 % ophthalmic suspension Place 1 drop into the left eye every 12 (twelve) hours. , Until Discontinued, Historical Med    clopidogrel (PLAVIX) 75 MG tablet TAKE 1 TABLET (75 MG TOTAL) BY MOUTH DAILY., Normal    isosorbide mononitrate (IMDUR) 30 MG 24 hr tablet Take 1 tablet (30 mg total) by mouth daily., Starting 09/05/2014, Until Discontinued, Normal    losartan (COZAAR) 50 MG tablet Take 1 tablet (50 mg total) by mouth daily., Starting 12/13/2014, Until Discontinued, Normal    metoprolol succinate (TOPROL-XL) 25 MG 24 hr tablet Take 1 tablet (25 mg total) by mouth daily., Starting 06/13/2015, Until Discontinued, Normal    naphazoline-glycerin (CLEAR EYES) 0.012-0.2 % SOLN Place 1-2 drops into the left eye every 4 (four) hours as needed for irritation., Until Discontinued, Historical Med    TRAVATAN Z 0.004 % SOLN ophthalmic solution Place 1 drop into both eyes at bedtime. , Starting 04/20/2013, Until Discontinued, Historical Med    nitroGLYCERIN (NITROSTAT) 0.4 MG SL tablet Place 1 tablet (  0.4 mg total) under the tongue every 5 (five) minutes as needed. For chest pain., Starting 06/02/2014, Until Discontinued, Normal    ondansetron (ZOFRAN ODT) 8 MG disintegrating tablet Take 1 tablet (8 mg total) by mouth every 8 (eight) hours as needed for nausea or vomiting., Starting 07/27/2015, Until Discontinued, Print    pantoprazole (PROTONIX) 40 MG tablet Take  1 tablet (40 mg total) by mouth daily., Starting 01/24/2015, Until Discontinued, Normal      STOP taking these medications     doxycycline (VIBRA-TABS) 100 MG tablet        Follow-up Information    Follow up with Cathlean Cower, MD. Schedule an appointment as soon as possible for a visit in 1 week.   Specialties:  Internal Medicine, Radiology   Why:  post hospitalization follow up, blood work (bmet)   Contact information:   [place] [address] [phone]       TOTAL DISCHARGE TIME: 58 minutes  [facility] Pager [phone]  08/03/2015, 12:49 PM

## 2015-08-04 ENCOUNTER — Telehealth: Payer: Self-pay | Admitting: *Deleted

## 2015-08-04 LAB — CULTURE, BLOOD (ROUTINE X 2)
CULTURE: NO GROWTH
CULTURE: NO GROWTH

## 2015-08-04 NOTE — Telephone Encounter (Signed)
Transition Care Management Follow-up Telephone Call   Date discharged? 08/03/15   How have you been since you were released from the hospital? Pt states he is feeling ok   Do you understand why you were in the hospital? YES   Do you understand the discharge instructions? YES   Where were you discharged to? Home   Items Reviewed:  Medications reviewed: YES  Allergies reviewed: YES  Dietary changes reviewed: NO  Referrals reviewed: No referral needed   Functional Questionnaire:   Activities of Daily Living (ADLs):   He states he are independent in the following: ambulation, bathing and hygiene, feeding, continence, grooming, toileting and dressing States he doesn't require assistance    Any transportation issues/concerns?: YES   Any patient concerns? NO   Confirmed importance and date/time of follow-up visits scheduled YES, made 08/09/15  Provider Appointment booked with Dr. Jenny Reichmann  Confirmed with patient if condition begins to worsen call PCP or go to the ER.  Patient was given the office number and encouraged to call back with question or concerns.  : YES

## 2015-08-09 ENCOUNTER — Ambulatory Visit: Payer: Medicare Other | Admitting: Internal Medicine

## 2015-08-16 ENCOUNTER — Other Ambulatory Visit (INDEPENDENT_AMBULATORY_CARE_PROVIDER_SITE_OTHER): Payer: Medicare Other

## 2015-08-16 ENCOUNTER — Ambulatory Visit (INDEPENDENT_AMBULATORY_CARE_PROVIDER_SITE_OTHER): Payer: Medicare Other | Admitting: Internal Medicine

## 2015-08-16 ENCOUNTER — Ambulatory Visit: Payer: Medicare Other | Admitting: Internal Medicine

## 2015-08-16 ENCOUNTER — Encounter: Payer: Self-pay | Admitting: Internal Medicine

## 2015-08-16 VITALS — BP 164/62 | HR 59 | Temp 97.5°F | Ht 67.0 in | Wt 133.0 lb

## 2015-08-16 DIAGNOSIS — I208 Other forms of angina pectoris: Secondary | ICD-10-CM

## 2015-08-16 DIAGNOSIS — I10 Essential (primary) hypertension: Secondary | ICD-10-CM

## 2015-08-16 DIAGNOSIS — D696 Thrombocytopenia, unspecified: Secondary | ICD-10-CM

## 2015-08-16 DIAGNOSIS — N183 Chronic kidney disease, stage 3 unspecified: Secondary | ICD-10-CM

## 2015-08-16 DIAGNOSIS — R197 Diarrhea, unspecified: Secondary | ICD-10-CM | POA: Diagnosis not present

## 2015-08-16 NOTE — Assessment & Plan Note (Signed)
stable overall by history and exam, recent data reviewed with pt, and pt to continue medical treatment as before,  to f/u any worsening symptoms or concerns Lab Results  Component Value Date   CREATININE 1.70* 08/03/2015   For f/u lab today

## 2015-08-16 NOTE — Patient Instructions (Addendum)

## 2015-08-16 NOTE — Assessment & Plan Note (Signed)
Exam benign, suspect antibx related , for probiotic prn, has finished vantin, likely to be self limited

## 2015-08-16 NOTE — Assessment & Plan Note (Addendum)
Elevated today, stable overall by history and exam, recent data reviewed with pt, and pt to continue medical treatment as before as declines further med change for now,  to f/u any worsening symptoms or concerns BP Readings from Last 3 Encounters:  08/16/15 164/62  08/03/15 152/54  07/27/15 130/44

## 2015-08-16 NOTE — Progress Notes (Signed)
Pre visit review using our clinic review tool, if applicable. No additional management support is needed unless otherwise documented below in the visit note. 

## 2015-08-16 NOTE — Assessment & Plan Note (Signed)
No overt bleeding/bruising - for f/u labs today,  to f/u any worsening symptoms or concerns

## 2015-08-16 NOTE — Progress Notes (Signed)
Subjective:    Patient ID: Nicholas Porter, male    DOB: 1930-04-18, 81 y.o.   MRN: [id number]  HPI  Here after recent hospn aug 6-11 for bacteremia, now finished vantin, pt thinks related to skin tear infection left post armm, course complicated by renal insuff and platelet abnormality.  Pt remains afeb without specific complaint except for loose stools 3-4 times yesterday, but Denies worsening reflux, abd pain, dysphagia, n/v, or blood. Taking po well.  No other overt bruising or bleeding.   Past Medical History  Diagnosis Date  . CAD in native artery 2001; 2011; 2014; 05/2014    a) 2001 & 2011 -- Med Rx for: 60-70% ostial Cx-OM lesion, 40-50% lesions in the LAD and RC; b) Persantine Myoview 2012: No ischemia or infarction, EF 66% c) 10/'14: RCA 100% - PCI,CxOM stable, LAD ~70% mid; d) UA 6/'15: 75% ISR in RCA - PTCA, LAD lesion ~40%, CxOM stable.    . ST elevation myocardial infarction (STEMI) of inferior wall, emergent PCI with Promus DES to RCA 10/05/2013    100% mRCA occlusion; residual 80% OM2, ~70% LAD --> PCI to RCA  . CAD S/P percutaneous coronary angioplasty 10/05/2013; 05/2014    a) 10/'14: PCI to RCA - Promus Premier DES 3.0 mm x 28 mm to mRCA (~3.19 mm);; b) 6/'15: Cutting Balloon PTCA of 75 % RCA ISR  . Moderate aortic insufficiency February 2012    Echocardiogram: EF > 55%; impaired relaxation. Mild/moderate MAC with mild MR; Moderate Aortic Regurgitation and mild sclerosis/ NO stenosis; mild - mod Pulm HTN (40-86mmHg);; Follow-up 05/2013: EF 65-70%, PAP ~31 mmH, otherwise no change  . Mild aortic stenosis  10/05/2013    Echo post STEMI: EF 55-60%. No regional W. MA. Mild aortic stenosis with mild regurgitation. MAC. Moderate pulmonary hypertension, 58 mmHg.  . Carotid artery stenosis  December 2012    s/p L CEA  . Hypertension, benign   . Hyperlipidemia LDL goal <70   . CKD (chronic kidney disease), stage III   . COPD (chronic obstructive pulmonary disease)  12/07/2011  .  Thrombocytopenia, acquired 06/30/2007  . Anemia, B12 deficiency 11/04/2008  . Personal history of peptic ulcer disease 06/30/2007  . GERD (gastroesophageal reflux disease) 06/30/2007  . Irritable bowel syndrome (IBS)  12/08/2008  . Gallstones  12/07/2011  . Hernia, hiatal  08/25/2008  . 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 History of colonic polyps 08/25/2008  . Diverticulosis of colon 12/08/2008  . Osteoarthritis 05/22/2008; 06/30/2007     with chronic upper and lumbar back pain as well as neck pain.  . Chronic back pain 12/06/2011  . Neck pain, chronic 12/06/2011  . Osteopenia 10/18/2008  . Hypothyroidism 10/18/2008  . Finger amputation, traumatic   . Personal history of TIA (transient ischemic attack) 2007  . GERD (gastroesophageal reflux disease) 01/24/2015   Past Surgical History  Procedure Laterality Date  . Foot surgery      left  . Carpal tunnel release    . Hernia repair    . Total knee arthroplasty    . Carotid endarterectomy Left 2007    Dr.Hayes  . Amputation      left index finger -tramatic  . Vagotomy      partial gastrectomy  . Partial gastrectomy    . Corneal transplant    . Rotator cuff repair      left  . Spine surgery  1985    cervical laminectomy  . Cardiac catheterization  November 2011  40% mid LAD, 50% proximal RCA, 30-40% distal RCA (DOMINANT RCA with wraparound PDA & 2 large PLBs), 60-70% ostial OM1. No change from 2001. Medical therapy  . Shoulder arthroscopy with rotator cuff repair and subacromial decompression Right 07/01/2013    Procedure: RIGHT SHOULDER ARTHROSCOPY WITH  SUBACROMIAL DECOMPRESSION/DISTAL CLAVICLE RESECTION/ROTATOR CUFF REPAIR ;  Surgeon: Marin Shutter, MD;  Location: [place];  Service: Orthopedics;  Laterality: Right;  . Nm myoview ltd  11/2013; 08/1014    a) 12/'14: EF 59%, Fixed Inferior-Inferoapical Defect c/w Inferior Scar; No Ischemia; b) 9/'15:  9/'15: EF 55% - persistent fixed inferior-inferoapical perfusion defect  - read as  diaphragmatic attenuation, likely consistent with infarct/scar   . Left heart catheterization with coronary angiogram N/A 10/05/2013    Procedure: LEFT HEART CATHETERIZATION WITH CORONARY ANGIOGRAM;  Surgeon: Peter M Martinique, MD;  Location[facility] C[facility] LAB;  Service: Cardiovascular;  RCA 100% - PCI,CxOM stable, LAD ~70% mid;  . Left heart catheterization with coronary angiogram N/A 06/08/2014    Procedure: LEFT HEART CATHETERIZATION WITH CORONARY ANGIOGRAM;  Surgeon: Troy Sine, MD;  Location: [facility] CATH LAB;  Service: Cardiovascular;  UA 6/'15: 75% ISR in RCA - PTCA, LAD lesion ~40%, CxOM stable.    . Percutaneous coronary intervention-balloon only  06/08/2014    Procedure: PERCUTANEOUS CORONARY INTERVENTION-BALLOON ONLY;  Surgeon: Troy Sine, MD;  Location: [facility] CATH LAB;  Service: Cardiovascular;;  . Coronary angioplasty with stent placement  09/2013; 05/2014     d) Promus DES 3.0 mm x 28 mm (3.2 mm)  to RCA with STEMI; residual 70% mid-distal LAD, OM 270-80%. Medical management; b) PTCA of 75 % ISR , LAD lesion noted as ~40%    reports that he quit smoking about 50 years ago. His smoking use included Cigarettes. He has never used smokeless tobacco. He reports that he does not drink alcohol or use illicit drugs. family history includes Diabetes in his sister; Heart disease in his sister. Allergies  Allergen Reactions  . Amlodipine Besylate Hives  . Hydralazine Itching  . Lipitor [Atorvastatin] Itching  . Naproxen Diarrhea  . Oxycodone-Acetaminophen Other (See Comments)    REACTION: unspecified   Current Outpatient Prescriptions on File Prior to Visit  Medication Sig Dispense Refill  . acyclovir (ZOVIRAX) 400 MG tablet Take 1 tablet (400 mg total) by mouth 5 (five) times daily. For 5 more days 25 tablet 0  . brinzolamide (AZOPT) 1 % ophthalmic suspension Place 1 drop into the left eye every 12 (twelve) hours.     . cefpodoxime (VANTIN) 200 MG tablet Take 1 tablet (200 mg total) by mouth every  12 (twelve) hours. For 9 more days 18 tablet 0  . clopidogrel (PLAVIX) 75 MG tablet TAKE 1 TABLET (75 MG TOTAL) BY MOUTH DAILY. 30 tablet 5  . isosorbide mononitrate (IMDUR) 30 MG 24 hr tablet Take 1 tablet (30 mg total) by mouth daily. 30 tablet 11  . losartan (COZAAR) 50 MG tablet Take 1 tablet (50 mg total) by mouth daily. 30 tablet 11  . metoprolol succinate (TOPROL-XL) 25 MG 24 hr tablet Take 1 tablet (25 mg total) by mouth daily. (Patient taking differently: Take 25 mg by mouth at bedtime. ) 90 tablet 3  . naphazoline-glycerin (CLEAR EYES) 0.012-0.2 % SOLN Place 1-2 drops into the left eye every 4 (four) hours as needed for irritation.    . nitroGLYCERIN (NITROSTAT) 0.4 MG SL tablet Place 1 tablet (0.4 mg total) under the tongue every 5 (five) minutes  as needed. For chest pain. 25 tablet 6  . ondansetron (ZOFRAN ODT) 8 MG disintegrating tablet Take 1 tablet (8 mg total) by mouth every 8 (eight) hours as needed for nausea or vomiting. 20 tablet 0  . pantoprazole (PROTONIX) 40 MG tablet Take 1 tablet (40 mg total) by mouth daily. 30 tablet 11  . sodium bicarbonate 650 MG tablet Take 1 tablet (650 mg total) by mouth 3 (three) times daily. For 7 more days 21 tablet 0  . TRAVATAN Z 0.004 % SOLN ophthalmic solution Place 1 drop into both eyes at bedtime.      No current facility-administered medications on file prior to visit.   Review of Systems  Constitutional: Negative for unusual diaphoresis or night sweats HENT: Negative for ringing in ear or discharge Eyes: Negative for double vision or worsening visual disturbance.  Respiratory: Negative for choking and stridor.   Gastrointestinal: Negative for vomiting or other signifcant bowel change Genitourinary: Negative for hematuria or change in urine volume.  Musculoskeletal: Negative for other MSK pain or swelling Skin: Negative for color change and worsening wound.  Neurological: Negative for tremors and numbness other than noted    Psychiatric/Behavioral: Negative for decreased concentration or agitation other than above       Objective:   Physical Exam BP 164/62 mmHg  Pulse 59  Temp(Src) 97.5 F (36.4 C) (Oral)  Ht 5\' 7"  (1.702 m)  Wt 133 lb (60.328 kg)  BMI 20.83 kg/m2  SpO2 97% VS noted, not ill appearing Constitutional: Pt appears in no significant distress HENT: Head: NCAT.  Right Ear: External ear normal.  Left Ear: External ear normal.  Eyes: . Pupils are equal, round, and reactive to light. Conjunctivae and EOM are normal Neck: Normal range of motion. Neck supple.  Cardiovascular: Normal rate and regular rhythm.   Pulmonary/Chest: Effort normal and breath sounds without rales or wheezing.  Abd:  Soft, NT, ND, + BS Neurological: Pt is alert. Not confused , motor grossly intact Skin: Skin is warm. No rash, no LE edema Psychiatric: Pt behavior is normal. No agitation.     Assessment & Plan:

## 2015-08-17 ENCOUNTER — Encounter: Payer: Self-pay | Admitting: Internal Medicine

## 2015-08-17 LAB — CBC WITH DIFFERENTIAL/PLATELET
Basophils Absolute: 0 10*3/uL (ref 0.0–0.1)
Basophils Relative: 0.4 % (ref 0.0–3.0)
Eosinophils Absolute: 0.2 10*3/uL (ref 0.0–0.7)
Eosinophils Relative: 3.2 % (ref 0.0–5.0)
HCT: 31.8 % — ABNORMAL LOW (ref 39.0–52.0)
Hemoglobin: 10.4 g/dL — ABNORMAL LOW (ref 13.0–17.0)
Lymphocytes Relative: 31.4 % (ref 12.0–46.0)
Lymphs Abs: 2 10*3/uL (ref 0.7–4.0)
MCHC: 32.7 g/dL (ref 30.0–36.0)
MCV: 87.4 fl (ref 78.0–100.0)
Monocytes Absolute: 0.5 10*3/uL (ref 0.1–1.0)
Monocytes Relative: 7.9 % (ref 3.0–12.0)
Neutro Abs: 3.7 10*3/uL (ref 1.4–7.7)
Neutrophils Relative %: 57.1 % (ref 43.0–77.0)
Platelets: 133 10*3/uL — ABNORMAL LOW (ref 150.0–400.0)
RBC: 3.64 Mil/uL — ABNORMAL LOW (ref 4.22–5.81)
RDW: 16 % — ABNORMAL HIGH (ref 11.5–15.5)
WBC: 6.5 10*3/uL (ref 4.0–10.5)

## 2015-08-17 LAB — BASIC METABOLIC PANEL
BUN: 33 mg/dL — ABNORMAL HIGH (ref 6–23)
CO2: 25 mEq/L (ref 19–32)
Calcium: 9 mg/dL (ref 8.4–10.5)
Chloride: 109 mEq/L (ref 96–112)
Creatinine, Ser: 1.87 mg/dL — ABNORMAL HIGH (ref 0.40–1.50)
GFR: 36.78 mL/min — ABNORMAL LOW (ref >60.00)
Glucose, Bld: 94 mg/dL (ref 70–99)
Potassium: 4.6 mEq/L (ref 3.5–5.1)
Sodium: 141 mEq/L (ref 135–145)

## 2015-08-19 ENCOUNTER — Other Ambulatory Visit: Payer: Self-pay | Admitting: Cardiology

## 2015-08-21 NOTE — Telephone Encounter (Signed)
REFILL 

## 2015-08-30 ENCOUNTER — Telehealth: Payer: Self-pay | Admitting: Cardiology

## 2015-08-30 NOTE — Telephone Encounter (Signed)
Patient told pharmacy staff his losartan dose was 25mg  instead of 50mg . Clarified that this med dose was not changed in June, just that Dr. Ellyn Hack recommended he take it in the AM and that his metoprolol dose was decreased from 50 to 25mg . She will communicate this with patient.

## 2015-08-30 NOTE — Telephone Encounter (Signed)
Faith(pharmacy tech) is calling in to get some clarification on the pt's Losartan prescription. She said that a prescription was sent in for 25 mg but he has been taking 50mg  for months. She just wanted to see which strength is correct. Please f/u with her   Thanks

## 2015-10-14 DIAGNOSIS — Z23 Encounter for immunization: Secondary | ICD-10-CM | POA: Diagnosis not present

## 2015-11-15 DIAGNOSIS — H401132 Primary open-angle glaucoma, bilateral, moderate stage: Secondary | ICD-10-CM | POA: Diagnosis not present

## 2015-11-19 ENCOUNTER — Other Ambulatory Visit: Payer: Self-pay | Admitting: Cardiology

## 2015-11-20 NOTE — Telephone Encounter (Signed)
Rx request sent to pharmacy.  

## 2015-11-27 ENCOUNTER — Inpatient Hospital Stay (HOSPITAL_COMMUNITY)
Admission: EM | Admit: 2015-11-27 | Discharge: 2015-12-03 | DRG: 391 | Disposition: A | Discharge status: Home / Self Care | Payer: Medicare Other | Attending: Internal Medicine | Admitting: Internal Medicine

## 2015-11-27 ENCOUNTER — Encounter (HOSPITAL_COMMUNITY): Payer: Self-pay | Admitting: Emergency Medicine

## 2015-11-27 DIAGNOSIS — Z87891 Personal history of nicotine dependence: Secondary | ICD-10-CM

## 2015-11-27 DIAGNOSIS — K902 Blind loop syndrome, not elsewhere classified: Secondary | ICD-10-CM | POA: Diagnosis present

## 2015-11-27 DIAGNOSIS — E43 Unspecified severe protein-calorie malnutrition: Secondary | ICD-10-CM | POA: Diagnosis present

## 2015-11-27 DIAGNOSIS — I2510 Atherosclerotic heart disease of native coronary artery without angina pectoris: Secondary | ICD-10-CM | POA: Diagnosis not present

## 2015-11-27 DIAGNOSIS — Z9861 Coronary angioplasty status: Secondary | ICD-10-CM | POA: Diagnosis not present

## 2015-11-27 DIAGNOSIS — Z8249 Family history of ischemic heart disease and other diseases of the circulatory system: Secondary | ICD-10-CM

## 2015-11-27 DIAGNOSIS — N179 Acute kidney failure, unspecified: Secondary | ICD-10-CM

## 2015-11-27 DIAGNOSIS — E440 Moderate protein-calorie malnutrition: Secondary | ICD-10-CM | POA: Insufficient documentation

## 2015-11-27 DIAGNOSIS — E872 Acidosis, unspecified: Secondary | ICD-10-CM | POA: Clinically undetermined

## 2015-11-27 DIAGNOSIS — I2119 ST elevation (STEMI) myocardial infarction involving other coronary artery of inferior wall: Secondary | ICD-10-CM

## 2015-11-27 DIAGNOSIS — K219 Gastro-esophageal reflux disease without esophagitis: Secondary | ICD-10-CM | POA: Diagnosis present

## 2015-11-27 DIAGNOSIS — Z79899 Other long term (current) drug therapy: Secondary | ICD-10-CM | POA: Diagnosis not present

## 2015-11-27 DIAGNOSIS — I252 Old myocardial infarction: Secondary | ICD-10-CM

## 2015-11-27 DIAGNOSIS — Z903 Acquired absence of stomach [part of]: Secondary | ICD-10-CM

## 2015-11-27 DIAGNOSIS — R197 Diarrhea, unspecified: Secondary | ICD-10-CM | POA: Diagnosis not present

## 2015-11-27 DIAGNOSIS — I10 Essential (primary) hypertension: Secondary | ICD-10-CM | POA: Diagnosis not present

## 2015-11-27 DIAGNOSIS — I779 Disorder of arteries and arterioles, unspecified: Secondary | ICD-10-CM | POA: Diagnosis present

## 2015-11-27 DIAGNOSIS — E860 Dehydration: Secondary | ICD-10-CM | POA: Diagnosis present

## 2015-11-27 DIAGNOSIS — I129 Hypertensive chronic kidney disease with stage 1 through stage 4 chronic kidney disease, or unspecified chronic kidney disease: Secondary | ICD-10-CM | POA: Diagnosis present

## 2015-11-27 DIAGNOSIS — D631 Anemia in chronic kidney disease: Secondary | ICD-10-CM | POA: Diagnosis present

## 2015-11-27 DIAGNOSIS — E875 Hyperkalemia: Secondary | ICD-10-CM | POA: Diagnosis present

## 2015-11-27 DIAGNOSIS — R112 Nausea with vomiting, unspecified: Secondary | ICD-10-CM | POA: Diagnosis present

## 2015-11-27 DIAGNOSIS — I739 Peripheral vascular disease, unspecified: Secondary | ICD-10-CM

## 2015-11-27 DIAGNOSIS — Z681 Body mass index (BMI) 19 or less, adult: Secondary | ICD-10-CM | POA: Diagnosis not present

## 2015-11-27 DIAGNOSIS — Z96659 Presence of unspecified artificial knee joint: Secondary | ICD-10-CM | POA: Diagnosis present

## 2015-11-27 DIAGNOSIS — D696 Thrombocytopenia, unspecified: Secondary | ICD-10-CM | POA: Diagnosis present

## 2015-11-27 DIAGNOSIS — A0819 Acute gastroenteropathy due to other small round viruses: Principal | ICD-10-CM | POA: Diagnosis present

## 2015-11-27 DIAGNOSIS — N184 Chronic kidney disease, stage 4 (severe): Secondary | ICD-10-CM | POA: Diagnosis present

## 2015-11-27 DIAGNOSIS — A09 Infectious gastroenteritis and colitis, unspecified: Secondary | ICD-10-CM | POA: Diagnosis not present

## 2015-11-27 DIAGNOSIS — Z833 Family history of diabetes mellitus: Secondary | ICD-10-CM

## 2015-11-27 DIAGNOSIS — D72829 Elevated white blood cell count, unspecified: Secondary | ICD-10-CM | POA: Diagnosis present

## 2015-11-27 HISTORY — DX: Chronic kidney disease, stage 4 (severe): N17.9

## 2015-11-27 HISTORY — DX: Chronic kidney disease, stage 4 (severe): N18.4

## 2015-11-27 HISTORY — DX: Anemia in chronic kidney disease: D63.1

## 2015-11-27 LAB — URINALYSIS, ROUTINE W REFLEX MICROSCOPIC
Glucose, UA: NEGATIVE mg/dL
Hgb urine dipstick: NEGATIVE
KETONES UR: NEGATIVE mg/dL
LEUKOCYTES UA: NEGATIVE
NITRITE: NEGATIVE
PH: 5 (ref 5.0–8.0)
Protein, ur: NEGATIVE mg/dL
Specific Gravity, Urine: 1.023 (ref 1.005–1.030)

## 2015-11-27 LAB — COMPREHENSIVE METABOLIC PANEL
ALBUMIN: 4.4 g/dL (ref 3.5–5.0)
ALT: 17 U/L (ref 17–63)
AST: 29 U/L (ref 15–41)
Alkaline Phosphatase: 86 U/L (ref 38–126)
Anion gap: 14 (ref 5–15)
BUN: 61 mg/dL — ABNORMAL HIGH (ref 6–20)
CO2: 14 mmol/L — ABNORMAL LOW (ref 22–32)
Calcium: 9.5 mg/dL (ref 8.9–10.3)
Chloride: 111 mmol/L (ref 101–111)
Creatinine, Ser: 2.55 mg/dL — ABNORMAL HIGH (ref 0.61–1.24)
GFR calc non Af Amer: 22 mL/min — ABNORMAL LOW (ref >60)
GFR, EST AFRICAN AMERICAN: 25 mL/min — AB (ref >60)
Glucose, Bld: 185 mg/dL — ABNORMAL HIGH (ref 65–99)
POTASSIUM: 5.3 mmol/L — AB (ref 3.5–5.1)
Sodium: 139 mmol/L (ref 135–145)
Total Bilirubin: 0.8 mg/dL (ref 0.3–1.2)
Total Protein: 8 g/dL (ref 6.5–8.1)

## 2015-11-27 LAB — C DIFFICILE QUICK SCREEN W PCR REFLEX
C Diff antigen: NEGATIVE
C Diff interpretation: NEGATIVE
C Diff toxin: NEGATIVE

## 2015-11-27 LAB — CBC WITH DIFFERENTIAL/PLATELET
BASOS PCT: 0 %
Basophils Absolute: 0 10*3/uL (ref 0.0–0.1)
EOS PCT: 0 %
Eosinophils Absolute: 0 10*3/uL (ref 0.0–0.7)
HCT: 38.8 % — ABNORMAL LOW (ref 39.0–52.0)
Hemoglobin: 12.1 g/dL — ABNORMAL LOW (ref 13.0–17.0)
LYMPHS ABS: 0.5 10*3/uL — AB (ref 0.7–4.0)
Lymphocytes Relative: 5 %
MCH: 27.4 pg (ref 26.0–34.0)
MCHC: 31.2 g/dL (ref 30.0–36.0)
MCV: 87.8 fL (ref 78.0–100.0)
MONOS PCT: 2 %
Monocytes Absolute: 0.2 10*3/uL (ref 0.1–1.0)
Neutro Abs: 9.8 10*3/uL — ABNORMAL HIGH (ref 1.7–7.7)
Neutrophils Relative %: 93 %
PLATELETS: 115 10*3/uL — AB (ref 150–400)
RBC: 4.42 MIL/uL (ref 4.22–5.81)
RDW: 16 % — ABNORMAL HIGH (ref 11.5–15.5)
WBC: 10.7 10*3/uL — AB (ref 4.0–10.5)

## 2015-11-27 LAB — POC OCCULT BLOOD, ED: FECAL OCCULT BLD: POSITIVE — AB

## 2015-11-27 LAB — PHOSPHORUS: PHOSPHORUS: 4.3 mg/dL (ref 2.5–4.6)

## 2015-11-27 LAB — MAGNESIUM: Magnesium: 1.8 mg/dL (ref 1.7–2.4)

## 2015-11-27 MED ORDER — SODIUM CHLORIDE 0.9 % IV BOLUS (SEPSIS)
1000.0000 mL | Freq: Once | INTRAVENOUS | Status: AC
  Administered 2015-11-27: 1000 mL via INTRAVENOUS

## 2015-11-27 MED ORDER — CLOPIDOGREL BISULFATE 75 MG PO TABS
75.0000 mg | ORAL_TABLET | Freq: Every day | ORAL | Status: DC
  Administered 2015-11-27 – 2015-12-03 (×7): 75 mg via ORAL
  Filled 2015-11-27 (×7): qty 1

## 2015-11-27 MED ORDER — NAPHAZOLINE HCL 0.1 % OP SOLN
1.0000 [drp] | Freq: Four times a day (QID) | OPHTHALMIC | Status: DC | PRN
  Filled 2015-11-27: qty 15

## 2015-11-27 MED ORDER — METOPROLOL SUCCINATE ER 25 MG PO TB24
25.0000 mg | ORAL_TABLET | Freq: Every day | ORAL | Status: DC
  Administered 2015-11-27 – 2015-12-03 (×7): 25 mg via ORAL
  Filled 2015-11-27 (×7): qty 1

## 2015-11-27 MED ORDER — ONDANSETRON HCL 4 MG PO TABS: 4.0000 mg | ORAL_TABLET | Freq: Four times a day (QID) | ORAL | Status: DC | PRN

## 2015-11-27 MED ORDER — SODIUM CHLORIDE 0.9 % IJ SOLN
3.0000 mL | Freq: Two times a day (BID) | INTRAMUSCULAR | Status: DC
  Administered 2015-11-27 – 2015-11-28 (×2): 3 mL via INTRAVENOUS

## 2015-11-27 MED ORDER — ACETAMINOPHEN ER 650 MG PO TBCR: 650.0000 mg | EXTENDED_RELEASE_TABLET | Freq: Three times a day (TID) | ORAL | Status: DC | PRN

## 2015-11-27 MED ORDER — NAPHAZOLINE-PHENIRAMINE 0.025-0.3 % OP SOLN
1.0000 [drp] | Freq: Four times a day (QID) | OPHTHALMIC | Status: DC | PRN
  Filled 2015-11-27: qty 5

## 2015-11-27 MED ORDER — FAMOTIDINE IN NACL 20-0.9 MG/50ML-% IV SOLN
20.0000 mg | Freq: Two times a day (BID) | INTRAVENOUS | Status: DC
  Administered 2015-11-27 – 2015-11-29 (×5): 20 mg via INTRAVENOUS
  Filled 2015-11-27 (×5): qty 50

## 2015-11-27 MED ORDER — BRINZOLAMIDE 1 % OP SUSP
1.0000 [drp] | Freq: Two times a day (BID) | OPHTHALMIC | Status: DC
  Administered 2015-11-27 – 2015-12-03 (×9): 1 [drp] via OPHTHALMIC
  Filled 2015-11-27: qty 10

## 2015-11-27 MED ORDER — ONDANSETRON HCL 4 MG/2ML IJ SOLN
4.0000 mg | Freq: Four times a day (QID) | INTRAMUSCULAR | Status: DC | PRN
  Administered 2015-11-27: 4 mg via INTRAVENOUS
  Filled 2015-11-27 (×2): qty 2

## 2015-11-27 MED ORDER — ACETAMINOPHEN 325 MG PO TABS
650.0000 mg | ORAL_TABLET | Freq: Three times a day (TID) | ORAL | Status: DC | PRN
  Administered 2015-11-27 – 2015-12-02 (×5): 650 mg via ORAL
  Filled 2015-11-27 (×5): qty 2

## 2015-11-27 MED ORDER — SODIUM CHLORIDE 0.9 % IV SOLN
INTRAVENOUS | Status: DC
  Administered 2015-11-27 – 2015-11-28 (×2): via INTRAVENOUS

## 2015-11-27 MED ORDER — LATANOPROST 0.005 % OP SOLN
1.0000 [drp] | Freq: Every day | OPHTHALMIC | Status: DC
  Administered 2015-11-27 – 2015-12-02 (×6): 1 [drp] via OPHTHALMIC
  Filled 2015-11-27: qty 2.5

## 2015-11-27 MED ORDER — SODIUM CHLORIDE 0.9 % IV BOLUS (SEPSIS)
500.0000 mL | Freq: Once | INTRAVENOUS | Status: AC
  Administered 2015-11-27: 500 mL via INTRAVENOUS

## 2015-11-27 NOTE — Progress Notes (Signed)
Initial Nutrition Assessment  DOCUMENTATION CODES:   Non-severe (moderate) malnutrition in context of acute illness/injury  INTERVENTION:  - Encourage adequate PO hydration - RD will continue to monitor for needs  NUTRITION DIAGNOSIS:   Altered nutrition lab value related to acute illness as evidenced by other (see comment) (K: 5.3 mmol/L.).  GOAL:   Patient will meet greater than or equal to 90% of their needs  MONITOR:   PO intake, Weight trends, Labs, Skin, I & O's  REASON FOR ASSESSMENT:   Consult Diet education  ASSESSMENT:   81 year old male with quite an extensive medical history including but not limited to coronary artery disease, STEMI status post PCI, carotid artery disease, status post CEA, chronic kidney disease stage III, anemia of chronic disease, hypertension. Patient presented to [facility] with reports of ongoing nausea, nonbloody vomiting and nonbloody diarrhea started around 3-4 AM this morning. Patient reports his wife had similar symptoms about one week ago. Patient reports some abdominal discomfort which gets better after vomiting. He has not had any vomiting or diarrhea while in ED.   Pt seen for consult. BMI indicates normal weight status. Pt eating lunch at time of RD visit and reports his appetite has improved. He states appetite decreased x1 day PTA with onset of current/admitting symptoms. Pt and wife, who is at bedside, states that pt usually has a very good appetite.  He denies usually having abdominal pain or nausea with intakes but that for the 1 day PTA he was experiencing these symptoms. He denies having chewing or swallowing difficulty now or PTA.   Pt and wife report that pt's weight has been stable with no recent changes. Per chart review, pt has lost 8 lbs (6% body weight) in the past 4 months which is not significant for time frame. Mild muscle and fat wasting noted during physical assessment.   Likely meeting needs until 1 day  ago. Will monitor for intakes and need for nutrition supplements during hospitalization. Medications reviewed. Labs reviewed; K: 5.3 mmol/L, BUN/creatinine elevated, GFR: 22.   Diet Order:  Diet regular Room service appropriate?: Yes; Fluid consistency:: Thin  Skin:  Reviewed, no issues  Last BM:  PTA  Height:   Ht Readings from Last 1 Encounters:  11/27/15 5\' 7"  (1.702 m)    Weight:   Wt Readings from Last 1 Encounters:  11/27/15 126 lb 1.6 oz (57.199 kg)    Ideal Body Weight:  67.27 kg (kg)  BMI:  Body mass index is 19.75 kg/(m^2).  Estimated Nutritional Needs:   Kcal:  1400-1600  Protein:  55-65 grams  Fluid:  >/= 2 L/day  EDUCATION NEEDS:   No education needs identified at this time     Jarome Matin, RD, LDN Inpatient Clinical Dietitian Pager # [phone] After hours/weekend pager # [phone]

## 2015-11-27 NOTE — ED Notes (Addendum)
Dr. Jeanell Sparrow @ bedside, will obtain Orthostatic VS when she is finished with evaluation. Pt is also aware we need a urine sample. Pt stated he used restroom around 0700 before coming to ED. Pt was provided urinal and will notify when he has voided.

## 2015-11-27 NOTE — ED Notes (Signed)
Pt can go up at 11:45

## 2015-11-27 NOTE — ED Provider Notes (Signed)
CSN: [id number]     Arrival date & time 11/27/15  0740 History   First MD Initiated Contact with Patient 11/27/15 [phone]     Chief Complaint  Patient presents with  . Diarrhea  . Nausea     (Consider location/radiation/quality/duration/timing/severity/associated sxs/prior Treatment) HPI 81 year old man who presents today from home. He has a history of multiple health problems. He reports that last night he became nauseated, vomited one time and had many episodes of watery diarrhea. He denies any fever or chills. His wife had a similar episode on Friday night although hers was more vomiting and diarrhea. He he Escherichia coli bacteremia in August.He reports no recent antibiotics.  Past Medical History  Diagnosis Date  . CAD in native artery 2001; 2011; 2014; 05/2014    a) 2001 & 2011 -- Med Rx for: 60-70% ostial Cx-OM lesion, 40-50% lesions in the LAD and RC; b) Persantine Myoview 2012: No ischemia or infarction, EF 66% c) 10/'14: RCA 100% - PCI,CxOM stable, LAD ~70% mid; d) UA 6/'15: 75% ISR in RCA - PTCA, LAD lesion ~40%, CxOM stable.    . ST elevation myocardial infarction (STEMI) of inferior wall, emergent PCI with Promus DES to RCA 10/05/2013    100% mRCA occlusion; residual 80% OM2, ~70% LAD --> PCI to RCA  . CAD S/P percutaneous coronary angioplasty 10/05/2013; 05/2014    a) 10/'14: PCI to RCA - Promus Premier DES 3.0 mm x 28 mm to mRCA (~3.19 mm);; b) 6/'15: Cutting Balloon PTCA of 75 % RCA ISR  . Moderate aortic insufficiency February 2012    Echocardiogram: EF > 55%; impaired relaxation. Mild/moderate MAC with mild MR; Moderate Aortic Regurgitation and mild sclerosis/ NO stenosis; mild - mod Pulm HTN (40-82mmHg);; Follow-up 05/2013: EF 65-70%, PAP ~31 mmH, otherwise no change  . Mild aortic stenosis  10/05/2013    Echo post STEMI: EF 55-60%. No regional W. MA. Mild aortic stenosis with mild regurgitation. MAC. Moderate pulmonary hypertension, 58 mmHg.  . Carotid artery stenosis   December 2012    s/p L CEA  . Hypertension, benign   . Hyperlipidemia LDL goal <70   . CKD (chronic kidney disease), stage III   . COPD (chronic obstructive pulmonary disease) ([place])  12/07/2011  . Thrombocytopenia, acquired 06/30/2007  . Anemia, B12 deficiency 11/04/2008  . Personal history of peptic ulcer disease 06/30/2007  . GERD (gastroesophageal reflux disease) 06/30/2007  . Irritable bowel syndrome (IBS)  12/08/2008  . Gallstones  12/07/2011  . Hernia, hiatal  08/25/2008  . 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 History of colonic polyps 08/25/2008  . Diverticulosis of colon 12/08/2008  . Osteoarthritis 05/22/2008; 06/30/2007     with chronic upper and lumbar back pain as well as neck pain.  . Chronic back pain 12/06/2011  . Neck pain, chronic 12/06/2011  . Osteopenia 10/18/2008  . Hypothyroidism 10/18/2008  . Finger amputation, traumatic   . Personal history of TIA (transient ischemic attack) 2007  . GERD (gastroesophageal reflux disease) 01/24/2015   Past Surgical History  Procedure Laterality Date  . Foot surgery      left  . Carpal tunnel release    . Hernia repair    . Total knee arthroplasty    . Carotid endarterectomy Left 2007    Dr.Hayes  . Amputation      left index finger -tramatic  . Vagotomy      partial gastrectomy  . Partial gastrectomy    . Corneal transplant    . Rotator  cuff repair      left  . Spine surgery  1985    cervical laminectomy  . Cardiac catheterization  November 2011    40% mid LAD, 50% proximal RCA, 30-40% distal RCA (DOMINANT RCA with wraparound PDA & 2 large PLBs), 60-70% ostial OM1. No change from 2001. Medical therapy  . Shoulder arthroscopy with rotator cuff repair and subacromial decompression Right 07/01/2013    Procedure: RIGHT SHOULDER ARTHROSCOPY WITH  SUBACROMIAL DECOMPRESSION/DISTAL CLAVICLE RESECTION/ROTATOR CUFF REPAIR ;  Surgeon: Marin Shutter, MD;  Location: [place];  Service: Orthopedics;  Laterality: Right;  . Nm myoview  ltd  11/2013; 08/1014    a) 12/'14: EF 59%, Fixed Inferior-Inferoapical Defect c/w Inferior Scar; No Ischemia; b) 9/'15:  9/'15: EF 55% - persistent fixed inferior-inferoapical perfusion defect  - read as diaphragmatic attenuation, likely consistent with infarct/scar   . Left heart catheterization with coronary angiogram N/A 10/05/2013    Procedure: LEFT HEART CATHETERIZATION WITH CORONARY ANGIOGRAM;  Surgeon: Peter M Martinique, MD;  Location: [facility] CATH LAB;  Service: Cardiovascular;  RCA 100% - PCI,CxOM stable, LAD ~70% mid;  . Left heart catheterization with coronary angiogram N/A 06/08/2014    Procedure: LEFT HEART CATHETERIZATION WITH CORONARY ANGIOGRAM;  Surgeon: Troy Sine, MD;  Location: [facility] CATH LAB;  Service: Cardiovascular;  UA 6/'15: 75% ISR in RCA - PTCA, LAD lesion ~40%, CxOM stable.    . Percutaneous coronary intervention-balloon only  06/08/2014    Procedure: PERCUTANEOUS CORONARY INTERVENTION-BALLOON ONLY;  Surgeon: Troy Sine, MD;  Location: [facility] CATH LAB;  Service: Cardiovascular;;  . Coronary angioplasty with stent placement  09/2013; 05/2014     d) Promus DES 3.0 mm x 28 mm (3.2 mm)  to RCA with STEMI; residual 70% mid-distal LAD, OM 270-80%. Medical management; b) PTCA of 75 % ISR , LAD lesion noted as ~40%   Family History  Problem Relation Age of Onset  . Heart disease Sister   . Diabetes Sister    Social History  Substance Use Topics  . Smoking status: Former Smoker    Types: Cigarettes    Quit date: 12/23/1964  . Smokeless tobacco: Never Used  . Alcohol Use: No    Review of Systems  All other systems reviewed and are negative.     Allergies  Amlodipine besylate; Hydralazine; Lipitor; Naproxen; and Oxycodone-acetaminophen  Home Medications   Prior to Admission medications   Medication Sig Start Date End Date Taking? Authorizing Provider  acyclovir (ZOVIRAX) 400 MG tablet Take 1 tablet (400 mg total) by mouth 5 (five) times daily. For 5 more days 08/03/15    Bonnielee Haff, MD  brinzolamide (AZOPT) 1 % ophthalmic suspension Place 1 drop into the left eye every 12 (twelve) hours.     Historical Provider, MD  cefpodoxime (VANTIN) 200 MG tablet Take 1 tablet (200 mg total) by mouth every 12 (twelve) hours. For 9 more days 08/03/15   Bonnielee Haff, MD  clopidogrel (PLAVIX) 75 MG tablet TAKE 1 TABLET (75 MG TOTAL) BY MOUTH DAILY. 06/15/15   Leonie Man, MD  isosorbide mononitrate (IMDUR) 30 MG 24 hr tablet TAKE 1 TABLET BY MOUTH EVERY DAY 08/21/15   Leonie Man, MD  losartan (COZAAR) 50 MG tablet TAKE 1 TABLET BY MOUTH EVERY DAY 11/20/15   Leonie Man, MD  metoprolol succinate (TOPROL-XL) 25 MG 24 hr tablet Take 1 tablet (25 mg total) by mouth daily. Patient taking differently: Take 25 mg by mouth at bedtime.  06/13/15   Leonie Man, MD  naphazoline-glycerin (CLEAR EYES) 0.012-0.2 % SOLN Place 1-2 drops into the left eye every 4 (four) hours as needed for irritation.    Historical Provider, MD  nitroGLYCERIN (NITROSTAT) 0.4 MG SL tablet Place 1 tablet (0.4 mg total) under the tongue every 5 (five) minutes as needed. For chest pain. 06/02/14   Leonie Man, MD  ondansetron (ZOFRAN ODT) 8 MG disintegrating tablet Take 1 tablet (8 mg total) by mouth every 8 (eight) hours as needed for nausea or vomiting. 07/27/15   Linton Flemings, MD  pantoprazole (PROTONIX) 40 MG tablet Take 1 tablet (40 mg total) by mouth daily. 01/24/15   Biagio Borg, MD  sodium bicarbonate 650 MG tablet Take 1 tablet (650 mg total) by mouth 3 (three) times daily. For 7 more days 08/03/15   Bonnielee Haff, MD  TRAVATAN Z 0.004 % SOLN ophthalmic solution Place 1 drop into both eyes at bedtime.  04/20/13   Historical Provider, MD   BP 137/65 mmHg  Pulse 80  Temp(Src) 97.5 F (36.4 C) (Oral)  Resp 17  SpO2 100% Physical Exam  Constitutional: He is oriented to person, place, and time. He appears well-developed and well-nourished.  HENT:  Head: Normocephalic and atraumatic.  Right  Ear: External ear normal.  Left Ear: External ear normal.  Nose: Nose normal.  Mouth/Throat: Oropharynx is clear and moist.  Eyes: Conjunctivae and EOM are normal. Pupils are equal, round, and reactive to light.  Neck: Normal range of motion. Neck supple.  Cardiovascular: Normal rate, regular rhythm, normal heart sounds and intact distal pulses.   Pulmonary/Chest: Effort normal and breath sounds normal. No respiratory distress. He has no wheezes. He exhibits no tenderness.  Abdominal: Soft. Bowel sounds are normal. He exhibits no distension and no mass. There is no tenderness. There is no guarding.  Musculoskeletal: Normal range of motion.  Neurological: He is alert and oriented to person, place, and time. He has normal reflexes. He exhibits normal muscle tone. Coordination normal.  Skin: Skin is warm and dry.  Psychiatric: He has a normal mood and affect. His behavior is normal. Judgment and thought content normal.  Nursing note and vitals reviewed.   ED Course  Procedures (including critical care time) Labs Review Labs Reviewed  CBC WITH DIFFERENTIAL/PLATELET - Abnormal; Notable for the following:    WBC 10.7 (*)    Hemoglobin 12.1 (*)    HCT 38.8 (*)    RDW 16.0 (*)    Platelets 115 (*)    Neutro Abs 9.8 (*)    Lymphs Abs 0.5 (*)    All other components within normal limits  COMPREHENSIVE METABOLIC PANEL - Abnormal; Notable for the following:    Potassium 5.3 (*)    CO2 14 (*)    Glucose, Bld 185 (*)    BUN 61 (*)    Creatinine, Ser 2.55 (*)    GFR calc non Af Amer 22 (*)    GFR calc Af Amer 25 (*)    All other components within normal limits  URINALYSIS, ROUTINE W REFLEX MICROSCOPIC (NOT AT [facility]) - Abnormal; Notable for the following:    Color, Urine AMBER (*)    APPearance CLOUDY (*)    Bilirubin Urine MODERATE (*)    All other components within normal limits  POC OCCULT BLOOD, ED - Abnormal; Notable for the following:    Fecal Occult Bld POSITIVE (*)    All other  components within normal limits  C DIFFICILE QUICK SCREEN W PCR REFLEX    Imaging Review No results found. I have personally reviewed and evaluated these images and lab results as part of my medical decision-making.   EKG Interpretation   Date/Time:  Monday November 27 2015 08:10:43 EST Ventricular Rate:  68 PR Interval:  156 QRS Duration: 83 QT Interval:  401 QTC Calculation: 426 R Axis:   16 Text Interpretation:  Sinus rhythm No significant change since january  2000 Confirmed by Rozelle Caudle MD, Andee Poles [phone]) on 11/27/2015 10:19:12 AM      MDM   Final diagnoses:  Diarrhea of presumed infectious origin  AKI (acute kidney injury) ([place])    81 year old male with multiple medical problems who comes in today complaining of symptoms consistent with infectious diarrhea. Here workup is significant for creatinine of the elevated 2.55. White blood cell count is slightly elevated at 10,700 with significant left shift. Potassium is elevated at 5.3. Patient is dehydrated and can have potassium rechecked. No evidence of changes consistent with hypokalemia are noted on EKG. Patient has had stool sent for C. difficile. Plan discussion with hospitalist regarding admission for further evaluation and treatment.    Pattricia Boss, MD 11/27/15 [phone]

## 2015-11-27 NOTE — ED Notes (Signed)
Pt states that he has been having diarrhea since last night (20-50 times).  Nausea with one episode of vomiting this morning at 0400.  Pt states he is not having any pain.

## 2015-11-27 NOTE — H&P (Signed)
[facility] History and Physical  Nicholas Porter [id number] DOB: 1930-09-28 DOA: 11/27/2015  Referring physician: ER physician: Dr. Pattricia Boss  PCP: Cathlean Cower, MD  Chief Complaint: Nausea, vomiting and diarrhea  HPI:  81 year old male with quite an extensive medical history including but not limited to coronary artery disease, STEMI status post PCI, carotid artery disease, status post CEA, chronic kidney disease stage III, anemia of chronic disease, hypertension. Patient presented to [facility] with reports of ongoing nausea, nonbloody vomiting and nonbloody diarrhea started around 3-4 AM this morning. Patient reports his wife had similar symptoms about one week ago. Patient reports some abdominal discomfort which gets better after vomiting. He has not had any vomiting or diarrhea while in ED. No reports of lightheadedness or loss of consciousness. No reports of chest pain, shortness of breath or palpitations. No reports of blood in the stool or urine. No recent antibiotic use.  In ED, patient was hemodynamically stable. Blood work was notable for white blood cell count of 10.7, hemoglobin 12.1, platelets 115, potassium 5.3, creatinine 2.55 (baseline about few months ago was 2.12). Patient was admitted for evaluation of nausea, vomiting and diarrhea.  Assessment & Plan    Principal Problem:   Nausea, vomiting and diarrhea / leukocytosis - Possible viral gastroenteritis - Stool for C. difficile collected, obtain GI pathogen panel - Continue supportive care with IV fluids, antiemetics as needed  Active Problems:   Essential hypertension - Continue metoprolol 25 mg daily - Losartan on hold because of renal insufficiency     History of blind loop syndrome - S/p ulcer surgury 1963 with vagotomy, partial gastrectomy with Bilroth I gastroenterostomy    CAD S/P percutaneous coronary angioplasty - DES in RCA with PTCA for ISR; Moderate mLAD, 80% ostial OM2 stable / H/O  Inferior STEMI (09/2013) emergent PCI with Promus DES to RCA (3.0 mm  x 28 mm - 3.2 mm) - No reports of chest pain - Continue Plavix    Left-sided carotid artery disease -- s/p CEA - Stable. Continue Plavix    GERD (gastroesophageal reflux disease) - Patient apparently no longer takes Protonix. Will place order for Pepcid IV 20 mg every 12 hours    Acute renal failure superimposed on stage 4 chronic kidney disease ([place]) - Baseline creatinine in August 2016 was 2.12 and on this admission 2.55. Likely secondary to prerenal etiology, GI losses as well as losartan. Losartan was placed on hold - Continue to monitor renal function. Will give IV fluids and hopefully creatinine will improve.    Anemia of chronic renal failure, stage 4 (severe) (HCC) - Hemoglobin stable -No current indication for transfusion   Thrombocytopenia (HCC) - Mild. Platelet count 115. - Continue to monitor daily CBC    Hyperkalemia - We'll repeat admission labs to make sure this is not an error    Dehydration - Due to GI losses - Continue supportive care with IV fluids    Protein-calorie malnutrition, severe ([place]) - In the context of acute illness - Nutrition consulted   DVT prophylaxis:  - SCDs bilaterally  Radiological Exams on Admission: No results found.   Code Status: Full Family Communication: Plan of care discussed with the patient and his wife at the bedsdie  Disposition Plan: Admit for further evaluation, telemetry   Black[place], Dedra Skeens, MD  [facility] Pager [phone]  Time spent in minutes: 75 minutes  Review of Systems:  Constitutional: Negative for fever, chills and malaise/fatigue. Negative for diaphoresis.  HENT: Negative for hearing  loss, ear pain, nosebleeds, congestion, sore throat, neck pain, tinnitus and ear discharge.   Eyes: Negative for blurred vision, double vision, photophobia, pain, discharge and redness.  Respiratory: Negative for cough, hemoptysis, sputum production,  shortness of breath, wheezing and stridor.   Cardiovascular: Negative for chest pain, palpitations, orthopnea, claudication and leg swelling.  Gastrointestinal: per HPI.  Genitourinary: Negative for dysuria, urgency, frequency, hematuria and flank pain.  Musculoskeletal: Negative for myalgias, back pain, joint pain and falls.  Skin: Negative for itching and rash.  Neurological: Negative for dizziness and weakness. Negative for tingling, tremors, sensory change, speech change, focal weakness, loss of consciousness and headaches.  Endo/Heme/Allergies: Negative for environmental allergies and polydipsia. Does not bruise/bleed easily.  Psychiatric/Behavioral: Negative for suicidal ideas. The patient is not nervous/anxious.      History reviewed. No pertinent past medical history. Past Surgical History  Procedure Laterality Date  . Foot surgery      left  . Carpal tunnel release    . Hernia repair    . Total knee arthroplasty    . Carotid endarterectomy Left 2007    Dr.Hayes  . Amputation      left index finger -tramatic  . Vagotomy      partial gastrectomy  . Partial gastrectomy    . Corneal transplant    . Rotator cuff repair      left  . Spine surgery  1985    cervical laminectomy  . Cardiac catheterization  November 2011    40% mid LAD, 50% proximal RCA, 30-40% distal RCA (DOMINANT RCA with wraparound PDA & 2 large PLBs), 60-70% ostial OM1. No change from 2001. Medical therapy  . Shoulder arthroscopy with rotator cuff repair and subacromial decompression Right 07/01/2013    Procedure: RIGHT SHOULDER ARTHROSCOPY WITH  SUBACROMIAL DECOMPRESSION/DISTAL CLAVICLE RESECTION/ROTATOR CUFF REPAIR ;  Surgeon: Marin Shutter, MD;  Location: [place];  Service: Orthopedics;  Laterality: Right;  . Nm myoview ltd  11/2013; 08/1014    a) 12/'14: EF 59%, Fixed Inferior-Inferoapical Defect c/w Inferior Scar; No Ischemia; b) 9/'15:  9/'15: EF 55% - persistent fixed inferior-inferoapical perfusion defect   - read as diaphragmatic attenuation, likely consistent with infarct/scar   . Left heart catheterization with coronary angiogram N/A 10/05/2013    Procedure: LEFT HEART CATHETERIZATION WITH CORONARY ANGIOGRAM;  Surgeon: Peter M Martinique, MD;  Location: [facility] CATH LAB;  Service: Cardiovascular;  RCA 100% - PCI,CxOM stable, LAD ~70% mid;  . Left heart catheterization with coronary angiogram N/A 06/08/2014    Procedure: LEFT HEART CATHETERIZATION WITH CORONARY ANGIOGRAM;  Surgeon: Troy Sine, MD;  Location: [facility] CATH LAB;  Service: Cardiovascular;  UA 6/'15: 75% ISR in RCA - PTCA, LAD lesion ~40%, CxOM stable.    . Percutaneous coronary intervention-balloon only  06/08/2014    Procedure: PERCUTANEOUS CORONARY INTERVENTION-BALLOON ONLY;  Surgeon: Troy Sine, MD;  Location: [facility] CATH LAB;  Service: Cardiovascular;;  . Coronary angioplasty with stent placement  09/2013; 05/2014     d) Promus DES 3.0 mm x 28 mm (3.2 mm)  to RCA with STEMI; residual 70% mid-distal LAD, OM 270-80%. Medical management; b) PTCA of 75 % ISR , LAD lesion noted as ~40%   Social History:  reports that he quit smoking about 50 years ago. His smoking use included Cigarettes. He has never used smokeless tobacco. He reports that he does not drink alcohol or use illicit drugs.  Allergies  Allergen Reactions  . Amlodipine Besylate Hives  . Hydralazine Itching  .  Lipitor [Atorvastatin] Itching  . Naproxen Diarrhea  . Oxycodone-Acetaminophen Itching    Family History:  Family History  Problem Relation Age of Onset  . Heart disease Sister   . Diabetes Sister      Prior to Admission medications   Medication Sig Start Date End Date Taking? Authorizing Provider  acetaminophen (TYLENOL) 650 MG CR tablet Take 650 mg by mouth every 8 (eight) hours as needed for pain.   Yes Historical Provider, MD  brinzolamide (AZOPT) 1 % ophthalmic suspension Place 1 drop into the left eye every 12 (twelve) hours.    Yes Historical Provider, MD   clopidogrel (PLAVIX) 75 MG tablet TAKE 1 TABLET (75 MG TOTAL) BY MOUTH DAILY. 06/15/15  Yes Leonie Man, MD  losartan (COZAAR) 50 MG tablet TAKE 1 TABLET BY MOUTH EVERY DAY 11/20/15  Yes Leonie Man, MD  metoprolol succinate (TOPROL-XL) 25 MG 24 hr tablet Take 1 tablet (25 mg total) by mouth daily. 06/13/15  Yes Leonie Man, MD  naphazoline-glycerin (CLEAR EYES) 0.012-0.2 % SOLN Place 1 drop into the left eye every 4 (four) hours as needed for irritation.    Yes Historical Provider, MD  nitroGLYCERIN (NITROSTAT) 0.4 MG SL tablet Place 1 tablet (0.4 mg total) under the tongue every 5 (five) minutes as needed. For chest pain. 06/02/14  Yes Leonie Man, MD  TRAVATAN Z 0.004 % SOLN ophthalmic solution Place 1 drop into both eyes at bedtime.  04/20/13  Yes Historical Provider, MD  isosorbide mononitrate (IMDUR) 30 MG 24 hr tablet TAKE 1 TABLET BY MOUTH EVERY DAY Patient not taking: Reported on 11/27/2015 08/21/15   Leonie Man, MD  ondansetron (ZOFRAN ODT) 8 MG disintegrating tablet Take 1 tablet (8 mg total) by mouth every 8 (eight) hours as needed for nausea or vomiting. Patient not taking: Reported on 11/27/2015 07/27/15   Linton Flemings, MD  pantoprazole (PROTONIX) 40 MG tablet Take 1 tablet (40 mg total) by mouth daily. Patient not taking: Reported on 11/27/2015 01/24/15   Biagio Borg, MD  sodium bicarbonate 650 MG tablet Take 1 tablet (650 mg total) by mouth 3 (three) times daily. For 7 more days Patient not taking: Reported on 11/27/2015 08/03/15   Bonnielee Haff, MD   Physical Exam: Filed Vitals:   11/27/15 0823 11/27/15 0824 11/27/15 0945 11/27/15 1050  BP:  126/52  145/68  Pulse: 80  63 63  Temp:      TempSrc:      Resp: 18 25 19 21   SpO2: 100%  100% 100%    Physical Exam  Constitutional: Appears ill, malnourished  HENT: Normocephalic. No tonsillar erythema or exudates Eyes: Conjunctivae are normal. No scleral icterus.  Neck: Normal ROM. Neck supple. No JVD. No tracheal  deviation. No thyromegaly.  CVS: RRR, S1/S2 appreciated.  Pulmonary: Effort and breath sounds normal, no stridor, rhonchi, wheezes, rales.  Abdominal: Soft. BS +,  no distension, tenderness, rebound or guarding.  Musculoskeletal: Normal range of motion. No edema and no tenderness.  Lymphadenopathy: No lymphadenopathy noted, cervical, inguinal. Neuro: Alert. Normal reflexes, muscle tone coordination. No focal neurologic deficits. Skin: Skin is warm and dry. No rash noted.  No erythema. No pallor.  Psychiatric: Normal mood and affect. Behavior, judgment, thought content normal.   Labs on Admission:  Basic Metabolic Panel:  Recent Labs Lab 11/27/15 0813  NA 139  K 5.3*  CL 111  CO2 14*  GLUCOSE 185*  BUN 61*  CREATININE 2.55*  CALCIUM 9.5  Liver Function Tests:  Recent Labs Lab 11/27/15 0813  AST 29  ALT 17  ALKPHOS 86  BILITOT 0.8  PROT 8.0  ALBUMIN 4.4   No results for input(s): LIPASE, AMYLASE in the last 168 hours. No results for input(s): AMMONIA in the last 168 hours. CBC:  Recent Labs Lab 11/27/15 0813  WBC 10.7*  NEUTROABS 9.8*  HGB 12.1*  HCT 38.8*  MCV 87.8  PLT 115*   Cardiac Enzymes: No results for input(s): CKTOTAL, CKMB, CKMBINDEX, TROPONINI in the last 168 hours. BNP: Invalid input(s): POCBNP CBG: No results for input(s): GLUCAP in the last 168 hours.  If 7PM-7AM, please contact night-coverage [email or web address] Password [email or web address] 11/27/2015, 12:05 PM

## 2015-11-28 DIAGNOSIS — E872 Acidosis, unspecified: Secondary | ICD-10-CM | POA: Clinically undetermined

## 2015-11-28 LAB — BASIC METABOLIC PANEL
Anion gap: 6 (ref 5–15)
BUN: 47 mg/dL — ABNORMAL HIGH (ref 6–20)
CALCIUM: 8.5 mg/dL — AB (ref 8.9–10.3)
CHLORIDE: 120 mmol/L — AB (ref 101–111)
CO2: 14 mmol/L — ABNORMAL LOW (ref 22–32)
CREATININE: 1.92 mg/dL — AB (ref 0.61–1.24)
GFR, EST AFRICAN AMERICAN: 36 mL/min — AB (ref >60)
GFR, EST NON AFRICAN AMERICAN: 31 mL/min — AB (ref >60)
Glucose, Bld: 96 mg/dL (ref 65–99)
Potassium: 4.9 mmol/L (ref 3.5–5.1)
SODIUM: 140 mmol/L (ref 135–145)

## 2015-11-28 LAB — GLUCOSE, CAPILLARY: GLUCOSE-CAPILLARY: 82 mg/dL (ref 65–99)

## 2015-11-28 LAB — TSH: TSH: 1.641 u[IU]/mL (ref 0.350–4.500)

## 2015-11-28 LAB — CBC
HCT: 31.9 % — ABNORMAL LOW (ref 39.0–52.0)
HEMOGLOBIN: 10.2 g/dL — AB (ref 13.0–17.0)
MCH: 27.6 pg (ref 26.0–34.0)
MCHC: 32 g/dL (ref 30.0–36.0)
MCV: 86.2 fL (ref 78.0–100.0)
Platelets: 75 10*3/uL — ABNORMAL LOW (ref 150–400)
RBC: 3.7 MIL/uL — AB (ref 4.22–5.81)
RDW: 16.3 % — ABNORMAL HIGH (ref 11.5–15.5)
WBC: 4.7 10*3/uL (ref 4.0–10.5)

## 2015-11-28 MED ORDER — STERILE WATER FOR INJECTION IV SOLN
INTRAVENOUS | Status: DC
  Administered 2015-11-28 (×2): via INTRAVENOUS
  Filled 2015-11-28 (×2): qty 850

## 2015-11-28 MED ORDER — LOPERAMIDE HCL 2 MG PO CAPS
4.0000 mg | ORAL_CAPSULE | Freq: Once | ORAL | Status: AC
  Administered 2015-11-28: 4 mg via ORAL
  Filled 2015-11-28: qty 2

## 2015-11-28 MED ORDER — ISOSORBIDE MONONITRATE ER 30 MG PO TB24
30.0000 mg | ORAL_TABLET | Freq: Every day | ORAL | Status: DC
  Administered 2015-11-28 – 2015-12-03 (×6): 30 mg via ORAL
  Filled 2015-11-28 (×6): qty 1

## 2015-11-28 MED ORDER — LOPERAMIDE HCL 2 MG PO CAPS
2.0000 mg | ORAL_CAPSULE | ORAL | Status: DC | PRN
  Administered 2015-11-28 – 2015-12-02 (×3): 2 mg via ORAL
  Filled 2015-11-28 (×3): qty 1

## 2015-11-28 MED ORDER — HYDRALAZINE HCL 20 MG/ML IJ SOLN
10.0000 mg | Freq: Four times a day (QID) | INTRAMUSCULAR | Status: DC | PRN
  Administered 2015-12-02: 10 mg via INTRAVENOUS
  Filled 2015-11-28 (×2): qty 1

## 2015-11-28 NOTE — Plan of Care (Signed)
Problem: Safety: Goal: Ability to remain free from injury will improve Outcome: Not Progressing Encouraged patient to use call bell with needs for toileting, Pt verbalized understanding but has not demonstrated knowledge.

## 2015-11-28 NOTE — Progress Notes (Signed)
[facility] PROGRESS NOTE  Nicholas Porter [id number] DOB: 03/06/1930 DOA: 11/27/2015 PCP: Cathlean Cower, MD  Assessment/Plan: #1 acute gastroenteritis Patient presented with nausea vomiting diarrhea. Patient with multiple loose stools. Patient with 5 loose stools today. Patient is afebrile. C. difficile PCR is negative. We'll place on Imodium as needed. IV fluids. Antiemetics. Supportive care.  #2 acute on chronic kidney disease stage IV Patient on admission noted to have elevated creatinine of 2.55. Felt to be secondary to prerenal azotemia in the setting of ARB. ARB was held. Patient was hydrated with IV fluids. Patient's renal function trended down and had improved. Resume ARB in 1-2 days. Follow.  #3 hypertension Patient's ARB was held secondary to problem #2. Patient was maintained on metoprolol. Imdur was resumed. Hydralazine as needed.  #4 history of blind loop syndrome Status post also surgery 1963 with vagotomy, partial gastrectomy with Billroth I gastroenterostomy. Outpatient follow-up.  #5 coronary artery disease status post percutaneous coronary angioplasty as-DES in RCA with PTCA for ISR; Moderate mLAD, 80% ostial OM2 stable / H/O Inferior STEMI (09/2013) emergent PCI with Promus DES to RCA (3.0 mm x 28 mm - 3.2 mm) Stable. Continue Plavix.  #6 left-sided carotid artery disease status post CEA Stable. Plavix.  #7 gastroesophageal reflux disease Pepcid.  #8 anemia of chronic kidney disease Stable.  #9 thrombocytopenia Stable. Follow.  #10 hyperkalemia Resolved.  0000000 metabolic acidosis Secondary to chronic kidney disease and GI losses. Place on bicarbonate drip. Follow. 1 bicarbonate levels have improved may transition to home dose oral bicarbonate tablets.  #12 dehydration  IV fluids.  #13 Protein Calorie malnuitrition  #14 Prophylaxis  SCDs for DVT  Code Status:Full  Communication: Updated patient and wife at bedside. Disposition Plan: Home once,  dairrhea has resolved.   Consultants:  None  Procedures:  None  Antibiotics:  None  HPI/Subjective: Patient states had 5 loose stools today. No nausea, no emesis, tolerating current diet.  Objective: Filed Vitals:   11/27/15 2049 11/28/15 0412  BP: 156/63 171/87  Pulse: 63 65  Temp: 98.8 F (37.1 C) 98.2 F (36.8 C)  Resp: 18 18    Intake/Output Summary (Last 24 hours) at 11/28/15 1222 Last data filed at 11/27/15 2300  Gross per 24 hour  Intake   1370 ml  Output    201 ml  Net   1169 ml   Filed Weights   11/27/15 1200 11/28/15 0429  Weight: 57.199 kg (126 lb 1.6 oz) 56.926 kg (125 lb 8 oz)    Exam:   General:  NAD  Cardiovascular: RRR  Respiratory: CTAB  Abdomen: Soft/NT/ND/+BS  Musculoskeletal: No c/c/e   Data Reviewed: Basic Metabolic Panel:  Recent Labs Lab 11/27/15 0813 11/27/15 1236 11/28/15 0529  NA 139  --  140  K 5.3*  --  4.9  CL 111  --  120*  CO2 14*  --  14*  GLUCOSE 185*  --  96  BUN 61*  --  47*  CREATININE 2.55*  --  1.92*  CALCIUM 9.5  --  8.5*  MG  --  1.8  --   PHOS  --  4.3  --    Liver Function Tests:  Recent Labs Lab 11/27/15 0813  AST 29  ALT 17  ALKPHOS 86  BILITOT 0.8  PROT 8.0  ALBUMIN 4.4   No results for input(s): LIPASE, AMYLASE in the last 168 hours. No results for input(s): AMMONIA in the last 168 hours. CBC:  Recent Labs Lab 11/27/15 0813  11/28/15 0529  WBC 10.7* 4.7  NEUTROABS 9.8*  --   HGB 12.1* 10.2*  HCT 38.8* 31.9*  MCV 87.8 86.2  PLT 115* 75*   Cardiac Enzymes: No results for input(s): CKTOTAL, CKMB, CKMBINDEX, TROPONINI in the last 168 hours. BNP (last 3 results) No results for input(s): BNP in the last 8760 hours.  ProBNP (last 3 results) No results for input(s): PROBNP in the last 8760 hours.  CBG:  Recent Labs Lab 11/28/15 0922  GLUCAP 82    Recent Results (from the past 240 hour(s))  C difficile quick scan w PCR reflex     Status: None   Collection Time:  11/27/15  4:58 PM  Result Value Ref Range Status   C Diff antigen NEGATIVE NEGATIVE Final   C Diff toxin NEGATIVE NEGATIVE Final   C Diff interpretation Negative for toxigenic C. difficile  Final     Studies: No results found.  Scheduled Meds: . brinzolamide  1 drop Left Eye Q12H  . clopidogrel  75 mg Oral Daily  . famotidine (PEPCID) IV  20 mg Intravenous Q12H  . latanoprost  1 drop Both Eyes QHS  . metoprolol succinate  25 mg Oral Daily  . sodium chloride  3 mL Intravenous Q12H   Continuous Infusions: .  sodium bicarbonate 150 mEq in sterile water 1000 mL infusion 75 mL/hr at 11/28/15 [id number]    Principal Problem:   Nausea vomiting and diarrhea Active Problems:   Essential hypertension   CAD S/P percutaneous coronary angioplasty - DES in RCA with PTCA for ISR; Moderate mLAD, 80% ostial OM2 stable   Blind loop syndrome   Left-sided carotid artery disease -- s/p CEA   H/O Inferior STEMI (09/2013) emergent PCI with Promus DES to RCA (3.0 mm  x 28 mm - 3.2 mm)   Thrombocytopenia (HCC)   GERD (gastroesophageal reflux disease)   Acute renal failure superimposed on stage 4 chronic kidney disease (HCC)   Anemia of chronic renal failure, stage 4 (severe) (HCC)   Hyperkalemia   Dehydration   Protein-calorie malnutrition, severe (HCC)   Leukocytosis   Metabolic acidosis    Time spent: [place] MD [facility] Pager [phone]. If 7PM-7AM, please contact night-coverage at [email or web address], password [facility] 11/28/2015, 12:22 PM  LOS: 1 day

## 2015-11-28 NOTE — Evaluation (Signed)
Physical Therapy Evaluation Patient Details Name: Nicholas Porter MRN: [id number] DOB: June 20, 1930 Today's Date: 11/28/2015   History of Present Illness  81 yo  male admitted with ongoing nausea, nonbloody vomiting and nonbloody diarrhea. PMH significant for coronary artery disease, STEMI status post PCI, carotid artery disease, status post CEA, chronic kidney disease stage III, anemia of chronic disease, and hypertension  Clinical Impression  Pt admitted with above diagnosis. Pt currently with functional limitations due to the deficits listed below (see PT Problem List). Pt will benefit from skilled PT to increase their independence and safety with mobility to allow discharge to the venue listed below. Pt did well ambulating with physical therapy today, however, was impulsive and demonstrated unsafe behavior. Experienced LOB during ambulation, but was able to recover and prevent a fall with therapist's assistance. Pt would benefit from PT while in the hospital and would require supervision at home. Pt lives with his wife who is able to provide necessary assistance. No equipment needs, pt has a walker and a cane at home.      Follow Up Recommendations No PT follow up;Supervision/Assistance - 24 hour    Equipment Recommendations  None recommended by PT    Recommendations for Other Services       Precautions / Restrictions Precautions Precautions: Fall Precaution Comments: incontinence Restrictions Weight Bearing Restrictions: No      Mobility  Bed Mobility               General bed mobility comments: pt up standing after using BSC at arrival   Transfers Overall transfer level: Modified independent Equipment used: None             General transfer comment: no cues for technique, however, pt is quick to get up and walk without considerations for lines and clutter in the room    Ambulation/Gait Ambulation/Gait assistance: Max assist Ambulation Distance (Feet): 320  Feet Assistive device: None Gait Pattern/deviations: Step-through pattern;Decreased stride length     General Gait Details: cues for safety and to slow down; pt is impulsive and quick on turns during walking balance check, pt experienced complete LOB and required max assist to recover his balance   Stairs            Wheelchair Mobility    Modified Rankin (Stroke Patients Only)       Balance Overall balance assessment: Needs assistance Sitting-balance support: No upper extremity supported Sitting balance-Leahy Scale: Good     Standing balance support: No upper extremity supported;During functional activity Standing balance-Leahy Scale: Fair               High level balance activites: Side stepping;Backward walking;Direction changes;Turns;Sudden stops;Head turns High Level Balance Comments: assesed pt's balance during amb, pt did well with head turns, backward walking, direction changes, and side stepping, however, experinced complete LOB during a 360* turn and required max assist from therapist to avoid fall              Pertinent Vitals/Pain Pain Assessment: No/denies pain    Home Living Family/patient expects to be discharged to:: Private residence Living Arrangements: Spouse/significant other Available Help at Discharge: Family Type of Home: House Home Access: Stairs to enter Entrance Stairs-Rails: Right Entrance Stairs-Number of Steps: 2 Home Layout: One level Home Equipment: Walker - 2 wheels;Cane - single point      Prior Function Level of Independence: Independent               Hand Dominance  Extremity/Trunk Assessment   Upper Extremity Assessment: Overall WFL for tasks assessed           Lower Extremity Assessment: Overall WFL for tasks assessed      Cervical / Trunk Assessment: Normal  Communication   Communication: No difficulties  Cognition Arousal/Alertness: Awake/alert Behavior During Therapy:  Impulsive Overall Cognitive Status: Within Functional Limits for tasks assessed                      General Comments General comments (skin integrity, edema, etc.): Pt is impulsive and wants to move fast without consideration for environment, provided education on importance of taking time to slow down, using RW at home for safety, and asking fo assistance when necessary.      Exercises        Assessment/Plan    PT Assessment Patient needs continued PT services  PT Diagnosis Difficulty walking   PT Problem List Decreased balance;Decreased activity tolerance;Decreased safety awareness;Decreased knowledge of use of DME;Decreased mobility  PT Treatment Interventions DME instruction;Gait training;Functional mobility training;Therapeutic activities;Therapeutic exercise;Patient/family education;Balance training   PT Goals (Current goals can be found in the Care Plan section) Acute Rehab PT Goals Patient Stated Goal: to stop diarrhea  PT Goal Formulation: With patient Time For Goal Achievement: 12/19/15 Potential to Achieve Goals: Good Additional Goals Additional Goal #1: Pt will demonstrate safe behavior while in the hospital adhering to fall precautions    Frequency Min 3X/week   Barriers to discharge        Co-evaluation               End of Session Equipment Utilized During Treatment: Gait belt Activity Tolerance: Patient tolerated treatment well Patient left: in chair;with call bell/phone within reach;with chair alarm set Nurse Communication: Mobility status;Other (comment) (incontinence, fall risk )         Time: [id number] PT Time Calculation (min) (ACUTE ONLY): 33 min   Charges:   PT Evaluation $Initial PT Evaluation Tier I: 1 Procedure PT Treatments $Gait Training: 8-22 mins   PT G Codes:        Damyia Strider 17-Dec-2015, 4:31 PM

## 2015-11-29 ENCOUNTER — Other Ambulatory Visit: Payer: Self-pay | Admitting: Cardiology

## 2015-11-29 DIAGNOSIS — I10 Essential (primary) hypertension: Secondary | ICD-10-CM

## 2015-11-29 DIAGNOSIS — D631 Anemia in chronic kidney disease: Secondary | ICD-10-CM

## 2015-11-29 DIAGNOSIS — E440 Moderate protein-calorie malnutrition: Secondary | ICD-10-CM | POA: Insufficient documentation

## 2015-11-29 DIAGNOSIS — K902 Blind loop syndrome, not elsewhere classified: Secondary | ICD-10-CM

## 2015-11-29 DIAGNOSIS — Z9861 Coronary angioplasty status: Secondary | ICD-10-CM

## 2015-11-29 DIAGNOSIS — I2510 Atherosclerotic heart disease of native coronary artery without angina pectoris: Secondary | ICD-10-CM

## 2015-11-29 DIAGNOSIS — R197 Diarrhea, unspecified: Secondary | ICD-10-CM

## 2015-11-29 DIAGNOSIS — R112 Nausea with vomiting, unspecified: Secondary | ICD-10-CM

## 2015-11-29 DIAGNOSIS — N184 Chronic kidney disease, stage 4 (severe): Secondary | ICD-10-CM

## 2015-11-29 LAB — RENAL FUNCTION PANEL
ALBUMIN: 3 g/dL — AB (ref 3.5–5.0)
ANION GAP: 6 (ref 5–15)
BUN: 41 mg/dL — ABNORMAL HIGH (ref 6–20)
CO2: 21 mmol/L — ABNORMAL LOW (ref 22–32)
Calcium: 8.3 mg/dL — ABNORMAL LOW (ref 8.9–10.3)
Chloride: 116 mmol/L — ABNORMAL HIGH (ref 101–111)
Creatinine, Ser: 1.95 mg/dL — ABNORMAL HIGH (ref 0.61–1.24)
GFR calc non Af Amer: 30 mL/min — ABNORMAL LOW (ref >60)
GFR, EST AFRICAN AMERICAN: 35 mL/min — AB (ref >60)
GLUCOSE: 93 mg/dL (ref 65–99)
PHOSPHORUS: 2.6 mg/dL (ref 2.5–4.6)
POTASSIUM: 4.3 mmol/L (ref 3.5–5.1)
Sodium: 143 mmol/L (ref 135–145)

## 2015-11-29 LAB — GI PATHOGEN PANEL BY PCR, STOOL
C DIFFICILE TOXIN A/B: NOT DETECTED
CAMPYLOBACTER BY PCR: NOT DETECTED
CRYPTOSPORIDIUM BY PCR: NOT DETECTED
E COLI (STEC): NOT DETECTED
E COLI 0157 BY PCR: NOT DETECTED
E coli (ETEC) LT/ST: NOT DETECTED
G lamblia by PCR: NOT DETECTED
Rotavirus A by PCR: NOT DETECTED
SALMONELLA BY PCR: NOT DETECTED
Shigella by PCR: NOT DETECTED

## 2015-11-29 LAB — CBC
HEMATOCRIT: 27.7 % — AB (ref 39.0–52.0)
HEMOGLOBIN: 8.9 g/dL — AB (ref 13.0–17.0)
MCH: 27.8 pg (ref 26.0–34.0)
MCHC: 32.1 g/dL (ref 30.0–36.0)
MCV: 86.6 fL (ref 78.0–100.0)
Platelets: 85 10*3/uL — ABNORMAL LOW (ref 150–400)
RBC: 3.2 MIL/uL — AB (ref 4.22–5.81)
RDW: 16.3 % — ABNORMAL HIGH (ref 11.5–15.5)
WBC: 4.9 10*3/uL (ref 4.0–10.5)

## 2015-11-29 LAB — GLUCOSE, CAPILLARY
GLUCOSE-CAPILLARY: 93 mg/dL (ref 65–99)
GLUCOSE-CAPILLARY: 105 mg/dL — AB (ref 65–99)

## 2015-11-29 MED ORDER — FAMOTIDINE IN NACL 20-0.9 MG/50ML-% IV SOLN
20.0000 mg | INTRAVENOUS | Status: DC
  Administered 2015-11-30 – 2015-12-03 (×4): 20 mg via INTRAVENOUS
  Filled 2015-11-29 (×4): qty 50

## 2015-11-29 MED ORDER — SODIUM CHLORIDE 0.9 % IV SOLN
INTRAVENOUS | Status: DC
  Administered 2015-11-29 – 2015-12-03 (×5): via INTRAVENOUS

## 2015-11-29 NOTE — Telephone Encounter (Signed)
REFILL 

## 2015-11-29 NOTE — Progress Notes (Signed)
[facility] PROGRESS NOTE  Nicholas Porter [id number] DOB: 02-Dec-1930 DOA: 11/27/2015 PCP: Cathlean Cower, MD  HPI/Brief narrative HPI:  81 year old male with quite an extensive medical history including but not limited to coronary artery disease, STEMI status post PCI, carotid artery disease, status post CEA, chronic kidney disease stage III, anemia of chronic disease, hypertension. Patient presented to [facility] long hospital with reports of ongoing nausea, nonbloody vomiting and nonbloody diarrhea started the morning of admission.  Assessment/Plan:  Nausea, vomiting and diarrhea / leukocytosis - Suspect likely viral gastroenteritis - Stool  C. difficile, obtain GI pathogen panel - Continue supportive care with IV fluids, antiemetics as needed - As pt is still symptomatic with marked diarrhea, will cont on full liquids only, advance as tolerated   Essential hypertension - Continue on metoprolol 25 mg daily - Losartan remains on hold because of renal insufficiency   History of blind loop syndrome - S/p ulcer surgury 1963 with vagotomy, partial gastrectomy with Bilroth I gastroenterostomy   CAD S/P percutaneous coronary angioplasty - DES in RCA with PTCA for ISR; Moderate mLAD, 80% ostial OM2 stable / H/O Inferior STEMI (09/2013) emergent PCI with Promus DES to RCA (3.0 mm x 28 mm - 3.2 mm) - Pt without chest pain - Continued on Plavix   Left-sided carotid artery disease -- s/p CEA - Stable. Continue Plavix   GERD (gastroesophageal reflux disease) - Patient apparently no longer takes Protonix. Cont    Acute renal failure superimposed on stage 4 chronic kidney disease ([employer]) - Baseline creatinine in August 2016 was 2.12 and on this admission 2.55. Likely secondary to prerenal etiology with GI losses in the setting of losartan. Losartan has since been placed on hold - Cont to monitor renal function   Anemia of chronic renal failure, stage 4 (severe) (HCC) - Hemoglobin  stable -No current indication for transfusion  Thrombocytopenia (HCC) - Mild. Platelet count stable. - Continue to monitor closely   Hyperkalemia - Normal   Dehydration - Due to GI losses - Continue supportive care with IV fluids   Protein-calorie malnutrition, severe (HCC) - In the context of acute illness - Nutrition was consulted  DVT prophylaxis:  - SCDs bilaterally  Code Status: Full Family Communication: Pt in room Disposition Plan: Home when diarrhea resolves and pt tolerates soft PO   Antibiotics: Anti-infectives    None      HPI/Subjective: Complains of diarrhea after eating eggs this AM  Objective: Filed Vitals:   11/28/15 2029 11/28/15 2100 11/29/15 0507 11/29/15 1016  BP: 182/71 131/54 130/60 140/49  Pulse: 69  65 60  Temp: 97.9 F (36.6 C)  97.7 F (36.5 C)   TempSrc: Oral  Oral   Resp: 18  18   Height:      Weight:   56.79 kg (125 lb 3.2 oz)   SpO2: 100%  100%     Intake/Output Summary (Last 24 hours) at 11/29/15 1448 Last data filed at 11/29/15 1012  Gross per 24 hour  Intake   1135 ml  Output      0 ml  Net   1135 ml   Filed Weights   11/27/15 1200 11/28/15 0429 11/29/15 0507  Weight: 57.199 kg (126 lb 1.6 oz) 56.926 kg (125 lb 8 oz) 56.79 kg (125 lb 3.2 oz)    Exam:   General:  Awake, in nad  Cardiovascular: regular, s1, s2  Respiratory: normal resp effort, no wheezing  Abdomen: soft,nondistended  Musculoskeletal: perfused, no clubbing   Data  Reviewed: Basic Metabolic Panel:  Recent Labs Lab 11/27/15 0813 11/27/15 1236 11/28/15 0529 11/29/15 0423  NA 139  --  140 143  K 5.3*  --  4.9 4.3  CL 111  --  120* 116*  CO2 14*  --  14* 21*  GLUCOSE 185*  --  96 93  BUN 61*  --  47* 41*  CREATININE 2.55*  --  1.92* 1.95*  CALCIUM 9.5  --  8.5* 8.3*  MG  --  1.8  --   --   PHOS  --  4.3  --  2.6   Liver Function Tests:  Recent Labs Lab 11/27/15 0813 11/29/15 0423  AST 29  --   ALT 17  --   ALKPHOS 86  --    BILITOT 0.8  --   PROT 8.0  --   ALBUMIN 4.4 3.0*   No results for input(s): LIPASE, AMYLASE in the last 168 hours. No results for input(s): AMMONIA in the last 168 hours. CBC:  Recent Labs Lab 11/27/15 0813 11/28/15 0529 11/29/15 0423  WBC 10.7* 4.7 4.9  NEUTROABS 9.8*  --   --   HGB 12.1* 10.2* 8.9*  HCT 38.8* 31.9* 27.7*  MCV 87.8 86.2 86.6  PLT 115* 75* 85*   Cardiac Enzymes: No results for input(s): CKTOTAL, CKMB, CKMBINDEX, TROPONINI in the last 168 hours. BNP (last 3 results) No results for input(s): BNP in the last 8760 hours.  ProBNP (last 3 results) No results for input(s): PROBNP in the last 8760 hours.  CBG:  Recent Labs Lab 11/28/15 0922 11/29/15 0822  GLUCAP 82 93    Recent Results (from the past 240 hour(s))  C difficile quick scan w PCR reflex     Status: None   Collection Time: 11/27/15  4:58 PM  Result Value Ref Range Status   C Diff antigen NEGATIVE NEGATIVE Final   C Diff toxin NEGATIVE NEGATIVE Final   C Diff interpretation Negative for toxigenic C. difficile  Final     Studies: No results found.  Scheduled Meds: . brinzolamide  1 drop Left Eye Q12H  . clopidogrel  75 mg Oral Daily  . [START ON 11/30/2015] famotidine (PEPCID) IV  20 mg Intravenous Q24H  . isosorbide mononitrate  30 mg Oral Daily  . latanoprost  1 drop Both Eyes QHS  . metoprolol succinate  25 mg Oral Daily  . sodium chloride  3 mL Intravenous Q12H   Continuous Infusions: . sodium chloride 75 mL/hr at 11/29/15 1224  .  sodium bicarbonate 150 mEq in sterile water 1000 mL infusion 75 mL/hr at 11/28/15 2141    Principal Problem:   Nausea vomiting and diarrhea Active Problems:   Essential hypertension   CAD S/P percutaneous coronary angioplasty - DES in RCA with PTCA for ISR; Moderate mLAD, 80% ostial OM2 stable   Blind loop syndrome   Left-sided carotid artery disease -- s/p CEA   H/O Inferior STEMI (09/2013) emergent PCI with Promus DES to RCA (3.0 mm  x 28 mm  - 3.2 mm)   Thrombocytopenia (HCC)   GERD (gastroesophageal reflux disease)   Acute renal failure superimposed on stage 4 chronic kidney disease (HCC)   Anemia of chronic renal failure, stage 4 (severe) (HCC)   Hyperkalemia   Dehydration   Protein-calorie malnutrition, severe (HCC)   Leukocytosis   Metabolic acidosis   Malnutrition of moderate degree   Ziv Welchel, [facility] Pager [phone]. If 7PM-7AM, please contact night-coverage at  [email or web address], password [facility] 11/29/2015, 2:48 PM  LOS: 2 days

## 2015-11-30 ENCOUNTER — Encounter: Payer: Self-pay | Admitting: Internal Medicine

## 2015-11-30 DIAGNOSIS — E440 Moderate protein-calorie malnutrition: Secondary | ICD-10-CM

## 2015-11-30 DIAGNOSIS — E43 Unspecified severe protein-calorie malnutrition: Secondary | ICD-10-CM

## 2015-11-30 DIAGNOSIS — E875 Hyperkalemia: Secondary | ICD-10-CM

## 2015-11-30 DIAGNOSIS — N179 Acute kidney failure, unspecified: Secondary | ICD-10-CM

## 2015-11-30 DIAGNOSIS — D72829 Elevated white blood cell count, unspecified: Secondary | ICD-10-CM

## 2015-11-30 LAB — BASIC METABOLIC PANEL
Anion gap: 6 (ref 5–15)
BUN: 30 mg/dL — ABNORMAL HIGH (ref 6–20)
CALCIUM: 8.2 mg/dL — AB (ref 8.9–10.3)
CO2: 21 mmol/L — AB (ref 22–32)
CREATININE: 1.81 mg/dL — AB (ref 0.61–1.24)
Chloride: 115 mmol/L — ABNORMAL HIGH (ref 101–111)
GFR calc Af Amer: 38 mL/min — ABNORMAL LOW (ref >60)
GFR calc non Af Amer: 33 mL/min — ABNORMAL LOW (ref >60)
GLUCOSE: 88 mg/dL (ref 65–99)
Potassium: 3.7 mmol/L (ref 3.5–5.1)
Sodium: 142 mmol/L (ref 135–145)

## 2015-11-30 LAB — GLUCOSE, CAPILLARY: Glucose-Capillary: 85 mg/dL (ref 65–99)

## 2015-11-30 MED ORDER — HYDRALAZINE HCL 20 MG/ML IJ SOLN
5.0000 mg | Freq: Once | INTRAMUSCULAR | Status: AC
  Administered 2015-11-30: 5 mg via INTRAVENOUS

## 2015-11-30 NOTE — Progress Notes (Signed)
[facility] PROGRESS NOTE  Nicholas Porter [id number] DOB: 08-30-1930 DOA: 11/27/2015 PCP: Cathlean Cower, MD  HPI/Brief narrative HPI:  81 year old male with quite an extensive medical history including but not limited to coronary artery disease, STEMI status post PCI, carotid artery disease, status post CEA, chronic kidney disease stage III, anemia of chronic disease, hypertension. Patient presented to [facility] long hospital with reports of ongoing nausea, nonbloody vomiting and nonbloody diarrhea started the morning of admission.  Assessment/Plan:  Nausea, vomiting and diarrhea / leukocytosis secondary to Norovirus - Stool studies pos for Norovirus, cdiff neg - Continue supportive care with IV fluids, antiemetics as needed - As pt is still symptomatic with marked diarrhea, will cont on full liquids only, advance as tolerated - Pt continues to report diarrhea this AM with eating - Continue enteric precautions, have advised family to limit visitation with patient   Essential hypertension - Continue on metoprolol 25 mg daily - Losartan remains on hold because of renal insufficiency   History of blind loop syndrome - S/p ulcer surgury 1963 with vagotomy, partial gastrectomy with Bilroth I gastroenterostomy   CAD S/P percutaneous coronary angioplasty - DES in RCA with PTCA for ISR; Moderate mLAD, 80% ostial OM2 stable / H/O Inferior STEMI (09/2013) emergent PCI with Promus DES to RCA (3.0 mm x 28 mm - 3.2 mm) - Pt without chest pain - Continued on Plavix   Left-sided carotid artery disease -- s/p CEA - Stable. Continue Plavix   GERD (gastroesophageal reflux disease) - Patient apparently no longer takes Protonix. Cont    Acute renal failure superimposed on stage 4 chronic kidney disease ([place]) - Baseline creatinine in August 2016 was 2.12 and on this admission 2.55. Likely secondary to prerenal etiology with GI losses in the setting of losartan. Losartan has since been  placed on hold - Cont to monitor renal function   Anemia of chronic renal failure, stage 4 (severe) (HCC) - Hemoglobin stable - No current indication for transfusion  Thrombocytopenia (HCC) - Mild. Platelet count stable. - Continue to monitor closely   Hyperkalemia - Normal   Dehydration - Due to GI losses - Continue supportive care with IV fluids   Protein-calorie malnutrition, severe (HCC) - In the context of acute illness - Nutrition was consulted  DVT prophylaxis:  - SCDs bilaterally  Code Status: Full Family Communication: Pt in room, pt's wife at bedside Disposition Plan: Home when diarrhea resolves and pt tolerates soft PO   Antibiotics: Anti-infectives    None      HPI/Subjective: Continues to have diarrhea shortly after eating  Objective: Filed Vitals:   11/30/15 0511 11/30/15 0614 11/30/15 0918 11/30/15 1415  BP: 173/52  176/60 166/47  Pulse: 57  89 72  Temp:    97.9 F (36.6 C)  TempSrc:    Oral  Resp:    20  Height:      Weight:  60.147 kg (132 lb 9.6 oz)    SpO2:    100%    Intake/Output Summary (Last 24 hours) at 11/30/15 1557 Last data filed at 11/30/15 1447  Gross per 24 hour  Intake   1610 ml  Output      1 ml  Net   1609 ml   Filed Weights   11/28/15 0429 11/29/15 0507 11/30/15 0614  Weight: 56.926 kg (125 lb 8 oz) 56.79 kg (125 lb 3.2 oz) 60.147 kg (132 lb 9.6 oz)    Exam:   General:  Awake, laying in bed, in nad  Cardiovascular: regular, s1, s2  Respiratory: normal resp effort, no wheezing  Abdomen: soft,nondistended  Musculoskeletal: perfused, no clubbing, no cyanosis  Data Reviewed: Basic Metabolic Panel:  Recent Labs Lab 11/27/15 0813 11/27/15 1236 11/28/15 0529 11/29/15 0423 11/30/15 0450  NA 139  --  140 143 142  K 5.3*  --  4.9 4.3 3.7  CL 111  --  120* 116* 115*  CO2 14*  --  14* 21* 21*  GLUCOSE 185*  --  96 93 88  BUN 61*  --  47* 41* 30*  CREATININE 2.55*  --  1.92* 1.95* 1.81*  CALCIUM  9.5  --  8.5* 8.3* 8.2*  MG  --  1.8  --   --   --   PHOS  --  4.3  --  2.6  --    Liver Function Tests:  Recent Labs Lab 11/27/15 0813 11/29/15 0423  AST 29  --   ALT 17  --   ALKPHOS 86  --   BILITOT 0.8  --   PROT 8.0  --   ALBUMIN 4.4 3.0*   No results for input(s): LIPASE, AMYLASE in the last 168 hours. No results for input(s): AMMONIA in the last 168 hours. CBC:  Recent Labs Lab 11/27/15 0813 11/28/15 0529 11/29/15 0423  WBC 10.7* 4.7 4.9  NEUTROABS 9.8*  --   --   HGB 12.1* 10.2* 8.9*  HCT 38.8* 31.9* 27.7*  MCV 87.8 86.2 86.6  PLT 115* 75* 85*   Cardiac Enzymes: No results for input(s): CKTOTAL, CKMB, CKMBINDEX, TROPONINI in the last 168 hours. BNP (last 3 results) No results for input(s): BNP in the last 8760 hours.  ProBNP (last 3 results) No results for input(s): PROBNP in the last 8760 hours.  CBG:  Recent Labs Lab 11/28/15 0922 11/29/15 0822 11/29/15 2121 11/30/15 0734  GLUCAP 82 93 105* 85    Recent Results (from the past 240 hour(s))  C difficile quick scan w PCR reflex     Status: None   Collection Time: 11/27/15  4:58 PM  Result Value Ref Range Status   C Diff antigen NEGATIVE NEGATIVE Final   C Diff toxin NEGATIVE NEGATIVE Final   C Diff interpretation Negative for toxigenic C. difficile  Final     Studies: No results found.  Scheduled Meds: . brinzolamide  1 drop Left Eye Q12H  . clopidogrel  75 mg Oral Daily  . famotidine (PEPCID) IV  20 mg Intravenous Q24H  . isosorbide mononitrate  30 mg Oral Daily  . latanoprost  1 drop Both Eyes QHS  . metoprolol succinate  25 mg Oral Daily  . sodium chloride  3 mL Intravenous Q12H   Continuous Infusions: . sodium chloride 75 mL/hr at 11/30/15 0155  .  sodium bicarbonate 150 mEq in sterile water 1000 mL infusion 75 mL/hr at 11/28/15 2141    Principal Problem:   Nausea vomiting and diarrhea Active Problems:   Essential hypertension   CAD S/P percutaneous coronary angioplasty -  DES in RCA with PTCA for ISR; Moderate mLAD, 80% ostial OM2 stable   Blind loop syndrome   Left-sided carotid artery disease -- s/p CEA   H/O Inferior STEMI (09/2013) emergent PCI with Promus DES to RCA (3.0 mm  x 28 mm - 3.2 mm)   Thrombocytopenia (HCC)   GERD (gastroesophageal reflux disease)   Acute renal failure superimposed on stage 4 chronic kidney disease (HCC)   Anemia of chronic renal failure, stage 4 (severe) (  [place])   Hyperkalemia   Dehydration   Protein-calorie malnutrition, severe (HCC)   Leukocytosis   Metabolic acidosis   Malnutrition of moderate degree   CHIU, STEPHEN K  [facility] Pager [phone]. If 7PM-7AM, please contact night-coverage at [email or web address], password [facility] 11/30/2015, 3:57 PM  LOS: 3 days

## 2015-11-30 NOTE — Progress Notes (Signed)
Physical Therapy Treatment Patient Details Name: Nicholas Porter MRN: [id number] DOB: Oct 30, 1930 Today's Date: 11/30/2015    History of Present Illness 81 yo  male admitted with ongoing nausea, nonbloody vomiting and nonbloody diarrhea. PMH significant for coronary artery disease, STEMI status post PCI, carotid artery disease, status post CEA, chronic kidney disease stage III, anemia of chronic disease, and hypertension    PT Comments    Pt feeling "better".  Assisted OOB to amb a greater distance.  No AD.  Did hold to IV pole.  Hopes to D/C to home tomorrow.  No equipment needed.   Follow Up Recommendations  No PT follow up;Supervision/Assistance - 24 hour     Equipment Recommendations  None recommended by PT    Recommendations for Other Services       Precautions / Restrictions Precautions Precautions: Fall Restrictions Weight Bearing Restrictions: No    Mobility  Bed Mobility Overal bed mobility: Modified Independent                Transfers Overall transfer level: Modified independent Equipment used: None             General transfer comment: good safety cognition and use of hands to steady self  Ambulation/Gait Ambulation/Gait assistance: Supervision Ambulation Distance (Feet): 385 Feet Assistive device: None Gait Pattern/deviations: Step-through pattern;Decreased stride length Gait velocity: decreased but functional   General Gait Details: pt did hold to IV pole and tolerated increased distance.  No LOB.     Stairs            Wheelchair Mobility    Modified Rankin (Stroke Patients Only)       Balance                                    Cognition Arousal/Alertness: Awake/alert Behavior During Therapy: WFL for tasks assessed/performed Overall Cognitive Status: Within Functional Limits for tasks assessed                      Exercises      General Comments        Pertinent Vitals/Pain Pain Assessment:  No/denies pain    Home Living                      Prior Function            PT Goals (current goals can now be found in the care plan section) Progress towards PT goals: Progressing toward goals    Frequency  Min 3X/week    PT Plan Current plan remains appropriate    Co-evaluation             End of Session Equipment Utilized During Treatment: Gait belt Activity Tolerance: Patient tolerated treatment well Patient left: in bed;with call bell/phone within reach     Time: 1515-1530 PT Time Calculation (min) (ACUTE ONLY): 15 min  Charges:  $Gait Training: 8-22 mins                    G Codes:      Rica Koyanagi  PTA [facility]  Acute  Rehab Pager      [phone]

## 2015-11-30 NOTE — Care Management Important Message (Signed)
Important Message  Patient Details Important Message  Patient Details IM Letter given to Cookie/Case Manager to present to Patient Name: Nicholas Porter MRN: [id number] Date of Birth: 22-Oct-1930   Medicare Important Message Given:       Nicholas Porter 11/30/2015, 11:50 AM Name: Courtney Reifsnyder MRN: [id number] Date of Birth: Dec 05, 1930   Medicare Important Message Given:       Nicholas Porter 11/30/2015, 11:50 AM

## 2015-12-01 LAB — BASIC METABOLIC PANEL
Anion gap: 6 (ref 5–15)
BUN: 21 mg/dL — AB (ref 6–20)
CALCIUM: 8.2 mg/dL — AB (ref 8.9–10.3)
CO2: 19 mmol/L — ABNORMAL LOW (ref 22–32)
Chloride: 115 mmol/L — ABNORMAL HIGH (ref 101–111)
Creatinine, Ser: 1.61 mg/dL — ABNORMAL HIGH (ref 0.61–1.24)
GFR calc Af Amer: 44 mL/min — ABNORMAL LOW (ref >60)
GFR, EST NON AFRICAN AMERICAN: 38 mL/min — AB (ref >60)
GLUCOSE: 92 mg/dL (ref 65–99)
Potassium: 4.2 mmol/L (ref 3.5–5.1)
SODIUM: 140 mmol/L (ref 135–145)

## 2015-12-01 LAB — GLUCOSE, CAPILLARY: GLUCOSE-CAPILLARY: 82 mg/dL (ref 65–99)

## 2015-12-01 MED ORDER — BOOST / RESOURCE BREEZE PO LIQD
1.0000 | Freq: Two times a day (BID) | ORAL | Status: DC
  Administered 2015-12-01 – 2015-12-03 (×4): 1 via ORAL

## 2015-12-01 NOTE — Progress Notes (Signed)
Nutrition Follow-up  DOCUMENTATION CODES:   Severe malnutrition in context of acute illness/injury  INTERVENTION:  - Will order Boost Breeze BID, each supplement provides 250 kcal and 9 grams of protein -RD will continue to monitor for needs  NUTRITION DIAGNOSIS:   Altered nutrition lab value related to acute illness as evidenced by other (see comment) (K: 5.3 mmol/L.). -resolved  Malnutrition related to acute illness as evidenced by moderate and severe muscle and fat wasting -NEW  GOAL:   Patient will meet greater than or equal to 90% of their needs -unmet on average  MONITOR:   PO intake, Supplement acceptance, Weight trends, Labs, Skin, I & O's  ASSESSMENT:   81 year old male with quite an extensive medical history including but not limited to coronary artery disease, STEMI status post PCI, carotid artery disease, status post CEA, chronic kidney disease stage III, anemia of chronic disease, hypertension. Patient presented to [facility] long hospital with reports of ongoing nausea, nonbloody vomiting and nonbloody diarrhea started around 3-4 AM this morning. Patient reports his wife had similar symptoms about one week ago. Patient reports some abdominal discomfort which gets better after vomiting. He has not had any vomiting or diarrhea while in ED.   12/9 Physical assessment redone as pt more willing at this time as he was not eating as he was during initial assessment. Findings of severe muscle and moderate fat wasting noted; malnutrition report updated based on findings.   Diet changes as follows: 12/5 @ 1149: Regular 12/7 @ 1031: CLD 12/7 @ 1116: FLD 12/9 @ [id number]: Soft  Per chart review, pt consumed 100% of breakfast and lunch and 50% of dinner 12/6 on Regular diet. No intakes documented while pt was on liquid diets. Pt reports that he ate breakfast prior to diet advancement this AM and consumed 100% of a bowl of grits. He denies abdominal pain or nausea with intakes this AM. Pt  states that he had 45% of stomach (partial gastrectomy) several years ago and is unable to consume much at any one time; will order Boost Breeze to supplement as pt may not feel as full after this supplement as he would with Ensure Enlive or Boost Plus, and, therefore, be able to consume more at meals.   He states that his UBW until 2 years ago was 165 lbs. Two years ago, he states, he had a heart attack and has been losing weight since that time. Weight trends addressed below from previous assessment.   Likely not consistently meeting needs at this time. Medications reviewed. Labs reviewed; CBGs: 82-105 mg/dL, Cl: 115 mmol/L, BUN/creatinine elevated but trending down, Ca: 8.2 mg/dL, GFR: 38.    12/5 - Pt eating lunch at time of RD visit and reports his appetite has improved.  - He states appetite decreased x1 day PTA with onset of current/admitting symptoms. - Pt and wife, who is at bedside, states that pt usually has a very good appetite. - He denies usually having abdominal pain or nausea with intakes but that for the 1 day PTA he was experiencing these symptoms.  - He denies having chewing or swallowing difficulty now or PTA.  - Pt and wife report that pt's weight has been stable with no recent changes.  - Per chart review, pt has lost 8 lbs (6% body weight) in the past 4 months which is not significant for time frame.  - Mild muscle and fat wasting noted during physical assessment.  - Likely meeting needs until 1 day ago.  -  Will monitor for intakes and need for nutrition supplements during hospitalization.   Diet Order:  DIET SOFT Room service appropriate?: Yes; Fluid consistency:: Thin  Skin:  Reviewed, no issues  Last BM:  12/8  Height:   Ht Readings from Last 1 Encounters:  11/27/15 5\' 7"  (1.702 m)    Weight:   Wt Readings from Last 1 Encounters:  12/01/15 144 lb 9.6 oz (65.59 kg)    Ideal Body Weight:  67.27 kg (kg)  BMI:  Body mass index is 22.64  kg/(m^2).  Estimated Nutritional Needs:   Kcal:  1400-1600  Protein:  55-65 grams  Fluid:  >/= 2 L/day  EDUCATION NEEDS:   No education needs identified at this time     Jarome Matin, RD, LDN Inpatient Clinical Dietitian Pager # [phone] After hours/weekend pager # [phone]

## 2015-12-01 NOTE — Progress Notes (Signed)
[facility] PROGRESS NOTE  Nicholas Porter [id number] DOB: 03/04/1930 DOA: 11/27/2015 PCP: Cathlean Cower, MD  HPI/Brief narrative HPI:  81 year old male with quite an extensive medical history including but not limited to coronary artery disease, STEMI status post PCI, carotid artery disease, status post CEA, chronic kidney disease stage III, anemia of chronic disease, hypertension. Patient presented to [facility] long hospital with reports of ongoing nausea, nonbloody vomiting and nonbloody diarrhea started the morning of admission.  Assessment/Plan:  Nausea, vomiting and diarrhea / leukocytosis secondary to Norovirus - Stool studies pos for Norovirus, cdiff neg - Continue supportive care with IV fluids, antiemetics as needed - Pt reports improving diarrhea - Continue enteric precautions, have advised family to limit visitation with patient - Will try to advance diet to soft diet   Essential hypertension - Continue on metoprolol 25 mg daily - Losartan remains on hold because of renal insufficiency   History of blind loop syndrome - S/p ulcer surgury 1963 with vagotomy, partial gastrectomy with Bilroth I gastroenterostomy   CAD S/P percutaneous coronary angioplasty - DES in RCA with PTCA for ISR; Moderate mLAD, 80% ostial OM2 stable / H/O Inferior STEMI (09/2013) emergent PCI with Promus DES to RCA (3.0 mm x 28 mm - 3.2 mm) - Pt without chest pain - Continued on Plavix   Left-sided carotid artery disease -- s/p CEA - Stable. Continue Plavix   GERD (gastroesophageal reflux disease) - Patient apparently no longer takes Protonix. Cont    Acute renal failure superimposed on stage 4 chronic kidney disease ([place]) - Baseline creatinine in August 2016 was 2.12 and on this admission 2.55. Likely secondary to prerenal etiology with GI losses in the setting of losartan. Losartan has since been placed on hold - Cont to monitor renal function - Cr has improved markedly since  admission. Currently 1.61   Anemia of chronic renal failure, stage 4 (severe) (HCC) - Hemoglobin stable - No current indication for transfusion  Thrombocytopenia (HCC) - Mild. Platelet count stable. - Continue to monitor closely   Hyperkalemia - Normalized   Dehydration - Due to GI losses - Continue supportive care with IV fluids   Protein-calorie malnutrition, severe (HCC) - In the context of acute illness - Nutrition was consulted  DVT prophylaxis:  - SCDs bilaterally  Code Status: Full Family Communication: Pt in room, pt's wife at bedside Disposition Plan: Home when diarrhea resolves and pt tolerates soft PO   Antibiotics: Anti-infectives    None      HPI/Subjective: Reports improving diarrhea. Willing to try soft diet today  Objective: Filed Vitals:   11/30/15 2200 12/01/15 0424 12/01/15 0429 12/01/15 0918  BP: 132/95 200/57  140/95  Pulse: 71 59  75  Temp: 97.9 F (36.6 C) 98.1 F (36.7 C)    TempSrc: Oral Oral    Resp: 20 16    Height:      Weight:   65.59 kg (144 lb 9.6 oz)   SpO2: 100% 98%      Intake/Output Summary (Last 24 hours) at 12/01/15 1133 Last data filed at 11/30/15 1757  Gross per 24 hour  Intake 2381.25 ml  Output    150 ml  Net 2231.25 ml   Filed Weights   11/29/15 0507 11/30/15 0614 12/01/15 0429  Weight: 56.79 kg (125 lb 3.2 oz) 60.147 kg (132 lb 9.6 oz) 65.59 kg (144 lb 9.6 oz)    Exam:   General:  Awake, laying in bed, in nad  Cardiovascular: regular, s1, s2  Respiratory: normal resp effort, no wheezing  Abdomen: soft,nondistended, pos BS  Musculoskeletal: perfused, no clubbing, no cyanosis  Data Reviewed: Basic Metabolic Panel:  Recent Labs Lab 11/27/15 0813 11/27/15 1236 11/28/15 0529 11/29/15 0423 11/30/15 0450 12/01/15 0440  NA 139  --  140 143 142 140  K 5.3*  --  4.9 4.3 3.7 4.2  CL 111  --  120* 116* 115* 115*  CO2 14*  --  14* 21* 21* 19*  GLUCOSE 185*  --  96 93 88 92  BUN 61*  --   47* 41* 30* 21*  CREATININE 2.55*  --  1.92* 1.95* 1.81* 1.61*  CALCIUM 9.5  --  8.5* 8.3* 8.2* 8.2*  MG  --  1.8  --   --   --   --   PHOS  --  4.3  --  2.6  --   --    Liver Function Tests:  Recent Labs Lab 11/27/15 0813 11/29/15 0423  AST 29  --   ALT 17  --   ALKPHOS 86  --   BILITOT 0.8  --   PROT 8.0  --   ALBUMIN 4.4 3.0*   No results for input(s): LIPASE, AMYLASE in the last 168 hours. No results for input(s): AMMONIA in the last 168 hours. CBC:  Recent Labs Lab 11/27/15 0813 11/28/15 0529 11/29/15 0423  WBC 10.7* 4.7 4.9  NEUTROABS 9.8*  --   --   HGB 12.1* 10.2* 8.9*  HCT 38.8* 31.9* 27.7*  MCV 87.8 86.2 86.6  PLT 115* 75* 85*   Cardiac Enzymes: No results for input(s): CKTOTAL, CKMB, CKMBINDEX, TROPONINI in the last 168 hours. BNP (last 3 results) No results for input(s): BNP in the last 8760 hours.  ProBNP (last 3 results) No results for input(s): PROBNP in the last 8760 hours.  CBG:  Recent Labs Lab 11/28/15 0922 11/29/15 0822 11/29/15 2121 11/30/15 0734 12/01/15 0749  GLUCAP 82 93 105* 85 82    Recent Results (from the past 240 hour(s))  C difficile quick scan w PCR reflex     Status: None   Collection Time: 11/27/15  4:58 PM  Result Value Ref Range Status   C Diff antigen NEGATIVE NEGATIVE Final   C Diff toxin NEGATIVE NEGATIVE Final   C Diff interpretation Negative for toxigenic C. difficile  Final     Studies: No results found.  Scheduled Meds: . brinzolamide  1 drop Left Eye Q12H  . clopidogrel  75 mg Oral Daily  . famotidine (PEPCID) IV  20 mg Intravenous Q24H  . feeding supplement  1 Container Oral BID BM  . isosorbide mononitrate  30 mg Oral Daily  . latanoprost  1 drop Both Eyes QHS  . metoprolol succinate  25 mg Oral Daily  . sodium chloride  3 mL Intravenous Q12H   Continuous Infusions: . sodium chloride 75 mL/hr at 11/30/15 0155  .  sodium bicarbonate 150 mEq in sterile water 1000 mL infusion 75 mL/hr at 11/28/15  2141    Principal Problem:   Nausea vomiting and diarrhea Active Problems:   Essential hypertension   CAD S/P percutaneous coronary angioplasty - DES in RCA with PTCA for ISR; Moderate mLAD, 80% ostial OM2 stable   Blind loop syndrome   Left-sided carotid artery disease -- s/p CEA   H/O Inferior STEMI (09/2013) emergent PCI with Promus DES to RCA (3.0 mm  x 28 mm - 3.2 mm)   Thrombocytopenia (HCC)   GERD (  gastroesophageal reflux disease)   Acute renal failure superimposed on stage 4 chronic kidney disease (HCC)   Anemia of chronic renal failure, stage 4 (severe) (HCC)   Hyperkalemia   Dehydration   Protein-calorie malnutrition, severe (HCC)   Leukocytosis   Metabolic acidosis   Malnutrition of moderate degree   Zaylynn Rickett, [facility] Pager [phone]. If 7PM-7AM, please contact night-coverage at [email or web address], password [facility] 12/01/2015, 11:33 AM  LOS: 4 days

## 2015-12-02 DIAGNOSIS — E860 Dehydration: Secondary | ICD-10-CM

## 2015-12-02 LAB — BASIC METABOLIC PANEL
ANION GAP: 4 — AB (ref 5–15)
BUN: 19 mg/dL (ref 6–20)
CALCIUM: 8 mg/dL — AB (ref 8.9–10.3)
CHLORIDE: 114 mmol/L — AB (ref 101–111)
CO2: 19 mmol/L — AB (ref 22–32)
Creatinine, Ser: 1.62 mg/dL — ABNORMAL HIGH (ref 0.61–1.24)
GFR calc non Af Amer: 38 mL/min — ABNORMAL LOW (ref >60)
GFR, EST AFRICAN AMERICAN: 44 mL/min — AB (ref >60)
GLUCOSE: 87 mg/dL (ref 65–99)
POTASSIUM: 3.7 mmol/L (ref 3.5–5.1)
Sodium: 137 mmol/L (ref 135–145)

## 2015-12-02 LAB — GLUCOSE, CAPILLARY: Glucose-Capillary: 71 mg/dL (ref 65–99)

## 2015-12-02 NOTE — Progress Notes (Signed)
[facility] PROGRESS NOTE  Nicholas Porter [id number] DOB: 1930/01/15 DOA: 11/27/2015 PCP: Cathlean Cower, MD  HPI/Brief narrative HPI:  81 year old male with quite an extensive medical history including but not limited to coronary artery disease, STEMI status post PCI, carotid artery disease, status post CEA, chronic kidney disease stage III, anemia of chronic disease, hypertension. Patient presented to [facility] hospital with reports of ongoing nausea, nonbloody vomiting and nonbloody diarrhea started the morning of admission.  Assessment/Plan:  Nausea, vomiting and diarrhea / leukocytosis secondary to Norovirus - Stool studies pos for Norovirus, cdiff neg - Continue supportive care with IV fluids, antiemetics as needed - Pt reports improving diarrhea - Continue enteric precautions, have advised family to limit visitation with patient - Failed soft diet on 12/9 after pt tried eating hamburger - continues on full liquids for now, would advance cautiously as tolerated   Essential hypertension - Continue on metoprolol 25 mg daily - Losartan remains on hold because of renal insufficiency   History of blind loop syndrome - S/p ulcer surgury 1963 with vagotomy, partial gastrectomy with Bilroth I gastroenterostomy   CAD S/P percutaneous coronary angioplasty - DES in RCA with PTCA for ISR; Moderate mLAD, 80% ostial OM2 stable / H/O Inferior STEMI (09/2013) emergent PCI with Promus DES to RCA (3.0 mm x 28 mm - 3.2 mm) - Pt without chest pain - Continued on Plavix   Left-sided carotid artery disease -- s/p CEA - Stable. Continue Plavix   GERD (gastroesophageal reflux disease) - Patient apparently no longer takes Protonix. Cont    Acute renal failure superimposed on stage 4 chronic kidney disease ([place]) - Baseline creatinine in August 2016 was 2.12 and on this admission 2.55. Likely secondary to prerenal etiology with GI losses in the setting of losartan. Losartan has since  been placed on hold - Cont to monitor renal function - Cr has improved markedly since admission. Currently stable   Anemia of chronic renal failure, stage 4 (severe) (HCC) - Hemoglobin stable - No current indication for transfusion  Thrombocytopenia (HCC) - Mild. Platelet count stable. - Continue to monitor closely   Hyperkalemia - Normalized   Dehydration - Due to GI losses - Continue supportive care with IV fluids   Protein-calorie malnutrition, severe (HCC) - In the context of acute illness - Nutrition was consulted  DVT prophylaxis:  - SCDs bilaterally  Code Status: Full Family Communication: Pt in room Disposition Plan: Home when diarrhea resolves and pt tolerates soft PO   Antibiotics: Anti-infectives    None      HPI/Subjective: Feels better today. Failed trial of hamburger yesterday  Objective: Filed Vitals:   12/01/15 1627 12/01/15 2057 12/02/15 0503 12/02/15 0850  BP: 153/79 180/61 177/69 218/66  Pulse: 65 57 71   Temp: 97.4 F (36.3 C) 98.3 F (36.8 C) 98.1 F (36.7 C)   TempSrc: Oral Oral Oral   Resp: 20 18 20 18   Height:      Weight:   63.3 kg (139 lb 8.8 oz)   SpO2: 100% 97% 99%     Intake/Output Summary (Last 24 hours) at 12/02/15 1346 Last data filed at 12/02/15 1155  Gross per 24 hour  Intake 2778.75 ml  Output    575 ml  Net 2203.75 ml   Filed Weights   11/30/15 0614 12/01/15 0429 12/02/15 0503  Weight: 60.147 kg (132 lb 9.6 oz) 65.59 kg (144 lb 9.6 oz) 63.3 kg (139 lb 8.8 oz)    Exam:   General:  Awake, laying in bed, in nad  Cardiovascular: regular, s1, s2  Respiratory: normal resp effort, no wheezing  Abdomen: soft,nondistended, pos BS  Musculoskeletal: perfused, no clubbing, no cyanosis  Data Reviewed: Basic Metabolic Panel:  Recent Labs Lab 11/27/15 1236 11/28/15 0529 11/29/15 0423 11/30/15 0450 12/01/15 0440 12/02/15 0445  NA  --  140 143 142 140 137  K  --  4.9 4.3 3.7 4.2 3.7  CL  --  120*  116* 115* 115* 114*  CO2  --  14* 21* 21* 19* 19*  GLUCOSE  --  96 93 88 92 87  BUN  --  47* 41* 30* 21* 19  CREATININE  --  1.92* 1.95* 1.81* 1.61* 1.62*  CALCIUM  --  8.5* 8.3* 8.2* 8.2* 8.0*  MG 1.8  --   --   --   --   --   PHOS 4.3  --  2.6  --   --   --    Liver Function Tests:  Recent Labs Lab 11/27/15 0813 11/29/15 0423  AST 29  --   ALT 17  --   ALKPHOS 86  --   BILITOT 0.8  --   PROT 8.0  --   ALBUMIN 4.4 3.0*   No results for input(s): LIPASE, AMYLASE in the last 168 hours. No results for input(s): AMMONIA in the last 168 hours. CBC:  Recent Labs Lab 11/27/15 0813 11/28/15 0529 11/29/15 0423  WBC 10.7* 4.7 4.9  NEUTROABS 9.8*  --   --   HGB 12.1* 10.2* 8.9*  HCT 38.8* 31.9* 27.7*  MCV 87.8 86.2 86.6  PLT 115* 75* 85*   Cardiac Enzymes: No results for input(s): CKTOTAL, CKMB, CKMBINDEX, TROPONINI in the last 168 hours. BNP (last 3 results) No results for input(s): BNP in the last 8760 hours.  ProBNP (last 3 results) No results for input(s): PROBNP in the last 8760 hours.  CBG:  Recent Labs Lab 11/29/15 0822 11/29/15 2121 11/30/15 0734 12/01/15 0749 12/02/15 0753  GLUCAP 93 105* 85 82 71    Recent Results (from the past 240 hour(s))  C difficile quick scan w PCR reflex     Status: None   Collection Time: 11/27/15  4:58 PM  Result Value Ref Range Status   C Diff antigen NEGATIVE NEGATIVE Final   C Diff toxin NEGATIVE NEGATIVE Final   C Diff interpretation Negative for toxigenic C. difficile  Final     Studies: No results found.  Scheduled Meds: . brinzolamide  1 drop Left Eye Q12H  . clopidogrel  75 mg Oral Daily  . famotidine (PEPCID) IV  20 mg Intravenous Q24H  . feeding supplement  1 Container Oral BID BM  . isosorbide mononitrate  30 mg Oral Daily  . latanoprost  1 drop Both Eyes QHS  . metoprolol succinate  25 mg Oral Daily  . sodium chloride  3 mL Intravenous Q12H   Continuous Infusions: . sodium chloride 75 mL/hr at  12/02/15 0506  .  sodium bicarbonate 150 mEq in sterile water 1000 mL infusion 75 mL/hr at 11/28/15 2141    Principal Problem:   Nausea vomiting and diarrhea Active Problems:   Essential hypertension   CAD S/P percutaneous coronary angioplasty - DES in RCA with PTCA for ISR; Moderate mLAD, 80% ostial OM2 stable   Blind loop syndrome   Left-sided carotid artery disease -- s/p CEA   H/O Inferior STEMI (09/2013) emergent PCI with Promus DES to RCA (3.0 mm  x  28 mm - 3.2 mm)   Thrombocytopenia (HCC)   GERD (gastroesophageal reflux disease)   Acute renal failure superimposed on stage 4 chronic kidney disease (HCC)   Anemia of chronic renal failure, stage 4 (severe) (HCC)   Hyperkalemia   Dehydration   Protein-calorie malnutrition, severe (HCC)   Leukocytosis   Metabolic acidosis   Malnutrition of moderate degree   CHIU, [facility] Pager [phone]. If 7PM-7AM, please contact night-coverage at [email or web address], password [facility] 12/02/2015, 1:46 PM  LOS: 5 days

## 2015-12-03 DIAGNOSIS — R197 Diarrhea, unspecified: Secondary | ICD-10-CM | POA: Insufficient documentation

## 2015-12-03 DIAGNOSIS — A09 Infectious gastroenteritis and colitis, unspecified: Secondary | ICD-10-CM

## 2015-12-03 LAB — BASIC METABOLIC PANEL
Anion gap: 6 (ref 5–15)
BUN: 15 mg/dL (ref 6–20)
CHLORIDE: 116 mmol/L — AB (ref 101–111)
CO2: 18 mmol/L — ABNORMAL LOW (ref 22–32)
CREATININE: 1.46 mg/dL — AB (ref 0.61–1.24)
Calcium: 8.3 mg/dL — ABNORMAL LOW (ref 8.9–10.3)
GFR calc Af Amer: 49 mL/min — ABNORMAL LOW (ref >60)
GFR calc non Af Amer: 43 mL/min — ABNORMAL LOW (ref >60)
Glucose, Bld: 100 mg/dL — ABNORMAL HIGH (ref 65–99)
Potassium: 4.1 mmol/L (ref 3.5–5.1)
SODIUM: 140 mmol/L (ref 135–145)

## 2015-12-03 LAB — TROPONIN I

## 2015-12-03 LAB — GLUCOSE, CAPILLARY: GLUCOSE-CAPILLARY: 90 mg/dL (ref 65–99)

## 2015-12-03 MED ORDER — SACCHAROMYCES BOULARDII 250 MG PO CAPS
250.0000 mg | ORAL_CAPSULE | Freq: Two times a day (BID) | ORAL | Status: DC
  Administered 2015-12-03: 250 mg via ORAL
  Filled 2015-12-03: qty 1

## 2015-12-03 MED ORDER — ONDANSETRON HCL 4 MG PO TABS: 4.0000 mg | ORAL_TABLET | Freq: Four times a day (QID) | ORAL | Status: DC | PRN

## 2015-12-03 MED ORDER — LOPERAMIDE HCL 2 MG PO CAPS: 2.0000 mg | ORAL_CAPSULE | ORAL | Status: DC | PRN

## 2015-12-03 MED ORDER — LOSARTAN POTASSIUM 50 MG PO TABS
50.0000 mg | ORAL_TABLET | Freq: Every day | ORAL | Status: DC
  Filled 2015-12-03: qty 1

## 2015-12-03 MED ORDER — SACCHAROMYCES BOULARDII 250 MG PO CAPS: 250.0000 mg | ORAL_CAPSULE | Freq: Two times a day (BID) | ORAL | Status: DC

## 2015-12-03 NOTE — Progress Notes (Signed)
Last night, around 2200, pt called RN into room stating he felt nauseous but could not vomit. By the time this RN entered room, nausea had passed. Pt denied any other complaints at that time and later went on to sleep.   This morning, upon entering pt's room this am, pt states "I am positive I had a heart attack last night." Pt stated that is how he felt last time he had a heart attack. VSS at this time. NP on call, L. Easterwood, paged regarding this. Will continue to monitor pt closely. Carnella [place] I

## 2015-12-03 NOTE — Progress Notes (Signed)
Pt tolerated diet. Pt denies any nausea, vomiting, abdominal pain, craps, of diarrhea.

## 2015-12-03 NOTE — Discharge Instructions (Signed)

## 2015-12-03 NOTE — Discharge Summary (Signed)
Physician Discharge Summary  Nicholas Porter [id number] DOB: May 23, 1930 DOA: 11/27/2015  PCP: Cathlean Cower, MD  Admit date: 11/27/2015 Discharge date: 12/03/2015  Time spent: 20 minutes  Recommendations for Outpatient Follow-up:  1. Follow up with PCP in 1-2 weeks   Discharge Diagnoses:  Principal Problem:   Nausea vomiting and diarrhea Active Problems:   Essential hypertension   CAD S/P percutaneous coronary angioplasty - DES in RCA with PTCA for ISR; Moderate mLAD, 80% ostial OM2 stable   Blind loop syndrome   Left-sided carotid artery disease -- s/p CEA   H/O Inferior STEMI (09/2013) emergent PCI with Promus DES to RCA (3.0 mm  x 28 mm - 3.2 mm)   Thrombocytopenia (HCC)   GERD (gastroesophageal reflux disease)   Acute renal failure superimposed on stage 4 chronic kidney disease (HCC)   Anemia of chronic renal failure, stage 4 (severe) (HCC)   Hyperkalemia   Dehydration   Protein-calorie malnutrition, severe (HCC)   Leukocytosis   Metabolic acidosis   Malnutrition of moderate degree   Discharge Condition: Improved  Diet recommendation: Soft, advance as tolerated  Filed Weights   12/01/15 0429 12/02/15 0503 12/03/15 0535  Weight: 65.59 kg (144 lb 9.6 oz) 63.3 kg (139 lb 8.8 oz) 64.411 kg (142 lb)    History of present illness:  Please review dictated H and P from 12/5 for details. Briefly, 81 year old male with quite an extensive medical history including but not limited to coronary artery disease, STEMI status post PCI, carotid artery disease, status post CEA, chronic kidney disease stage III, anemia of chronic disease, hypertension. Patient presented to [facility] hospital with reports of ongoing nausea, nonbloody vomiting and nonbloody diarrhea started the morning of admission.  Hospital Course:  Nausea, vomiting and diarrhea / leukocytosis secondary to Norovirus - Stool studies pos for Norovirus, cdiff neg - Continued supportive care with IV fluids, antiemetics as  needed - Continue enteric precautions, have advised family to limit visitation with patient - Failed soft diet on 12/9 after pt tried eating hamburger - Diet ultimately was successfully advanced to a soft diet, to be advanced as an outpatient  Essential hypertension - Continued on metoprolol 25 mg daily - Losartan was placed on hold because of renal insufficiency, to be resumed given return of normal renal function  History of blind loop syndrome - S/p ulcer surgury 1963 with vagotomy, partial gastrectomy with Bilroth I gastroenterostomy  CAD S/P percutaneous coronary angioplasty - DES in RCA with PTCA for ISR; Moderate mLAD, 80% ostial OM2 stable / H/O Inferior STEMI (09/2013) emergent PCI with Promus DES to RCA (3.0 mm x 28 mm - 3.2 mm) - Pt without chest pain - Continued on Plavix - Pt did have bout of nausea w/o emesis and pt believed he had MI. Troponin was neg and EKG was without acute changes. In the setting of active noravirus infection, infectious gastroenteritis is a much more likely etiology of pt's complaint of transient nausea  Left-sided carotid artery disease -- s/p CEA - Stable. Continue Plavix  GERD (gastroesophageal reflux disease) - Patient apparently no longer takes Protonix. Cont   Acute renal failure superimposed on stage 4 chronic kidney disease ([place]) - Baseline creatinine in August 2016 was 2.12 and on this admission 2.55. Likely secondary to prerenal etiology with GI losses in the setting of losartan. Losartan was placed on hold - Cr has improved markedly since admission, 1.46 on day of discharge -Pt to resume losartan  Anemia of chronic renal failure, stage 4 (  severe) (HCC) - Hemoglobin stable - No current indication for transfusion  Thrombocytopenia (HCC) - Mild. Platelet count stable. - Continue to monitor closely  Hyperkalemia - Normalized  Dehydration - Due to GI losses - Continued supportive care with IV fluids  Protein-calorie  malnutrition, severe ([place]) - In the context of acute illness - Nutrition was consulted  DVT prophylaxis:  - SCDs bilaterally while admitted  Discharge Exam: Filed Vitals:   12/02/15 1500 12/02/15 2104 12/03/15 0535 12/03/15 1300  BP: 176/82 199/62 157/69 152/66  Pulse: 61 64 64 62  Temp: 98.6 F (37 C) 99.2 F (37.3 C) 98.4 F (36.9 C) 98.6 F (37 C)  TempSrc: Oral Oral Oral Oral  Resp: 20 20 19 18   Height:      Weight:   64.411 kg (142 lb)   SpO2: 100% 99% 100% 100%    General: awake, in nad Cardiovascular: regular, s1, s2 Respiratory: normal resp effort, no wheezing  Discharge Instructions     Medication List    STOP taking these medications        ondansetron 8 MG disintegrating tablet  Commonly known as:  ZOFRAN ODT     sodium bicarbonate 650 MG tablet      TAKE these medications        acetaminophen 650 MG CR tablet  Commonly known as:  TYLENOL  Take 650 mg by mouth every 8 (eight) hours as needed for pain.     brinzolamide 1 % ophthalmic suspension  Commonly known as:  AZOPT  Place 1 drop into the left eye every 12 (twelve) hours.     clopidogrel 75 MG tablet  Commonly known as:  PLAVIX  Take 1 tablet (75 mg total) by mouth daily. NEED OV.     isosorbide mononitrate 30 MG 24 hr tablet  Commonly known as:  IMDUR  TAKE 1 TABLET BY MOUTH EVERY DAY     loperamide 2 MG capsule  Commonly known as:  IMODIUM  Take 1 capsule (2 mg total) by mouth as needed for diarrhea or loose stools.     losartan 50 MG tablet  Commonly known as:  COZAAR  TAKE 1 TABLET BY MOUTH EVERY DAY     metoprolol succinate 25 MG 24 hr tablet  Commonly known as:  TOPROL-XL  Take 1 tablet (25 mg total) by mouth daily.     naphazoline-glycerin 0.012-0.2 % Soln  Commonly known as:  CLEAR EYES  Place 1 drop into the left eye every 4 (four) hours as needed for irritation.     nitroGLYCERIN 0.4 MG SL tablet  Commonly known as:  NITROSTAT  Place 1 tablet (0.4 mg total) under  the tongue every 5 (five) minutes as needed. For chest pain.     ondansetron 4 MG tablet  Commonly known as:  ZOFRAN  Take 1 tablet (4 mg total) by mouth every 6 (six) hours as needed for nausea.     saccharomyces boulardii 250 MG capsule  Commonly known as:  FLORASTOR  Take 1 capsule (250 mg total) by mouth 2 (two) times daily.     TRAVATAN Z 0.004 % Soln ophthalmic solution  Generic drug:  Travoprost (BAK Free)  Place 1 drop into both eyes at bedtime.       Allergies  Allergen Reactions  . Amlodipine Besylate Hives  . Hydralazine Itching  . Lipitor [Atorvastatin] Itching  . Naproxen Diarrhea  . Oxycodone-Acetaminophen Itching   Follow-up Information    Follow up with Cathlean Cower,  MD. Schedule an appointment as soon as possible for a visit in 1 week.   Specialties:  Internal Medicine, Radiology   Why:  Hospital follow up   Contact information:   [address] [place] [address] [phone]        The results of significant diagnostics from this hospitalization (including imaging, microbiology, ancillary and laboratory) are listed below for reference.    Significant Diagnostic Studies: No results found.  Microbiology: Recent Results (from the past 240 hour(s))  C difficile quick scan w PCR reflex     Status: None   Collection Time: 11/27/15  4:58 PM  Result Value Ref Range Status   C Diff antigen NEGATIVE NEGATIVE Final   C Diff toxin NEGATIVE NEGATIVE Final   C Diff interpretation Negative for toxigenic C. difficile  Final     Labs: Basic Metabolic Panel:  Recent Labs Lab 11/27/15 1236  11/29/15 0423 11/30/15 0450 12/01/15 0440 12/02/15 0445 12/03/15 0509  NA  --   < > 143 142 140 137 140  K  --   < > 4.3 3.7 4.2 3.7 4.1  CL  --   < > 116* 115* 115* 114* 116*  CO2  --   < > 21* 21* 19* 19* 18*  GLUCOSE  --   < > 93 88 92 87 100*  BUN  --   < > 41* 30* 21* 19 15  CREATININE  --   < > 1.95* 1.81* 1.61* 1.62* 1.46*  CALCIUM  --   < > 8.3*  8.2* 8.2* 8.0* 8.3*  MG 1.8  --   --   --   --   --   --   PHOS 4.3  --  2.6  --   --   --   --   < > = values in this interval not displayed. Liver Function Tests:  Recent Labs Lab 11/27/15 0813 11/29/15 0423  AST 29  --   ALT 17  --   ALKPHOS 86  --   BILITOT 0.8  --   PROT 8.0  --   ALBUMIN 4.4 3.0*   No results for input(s): LIPASE, AMYLASE in the last 168 hours. No results for input(s): AMMONIA in the last 168 hours. CBC:  Recent Labs Lab 11/27/15 0813 11/28/15 0529 11/29/15 0423  WBC 10.7* 4.7 4.9  NEUTROABS 9.8*  --   --   HGB 12.1* 10.2* 8.9*  HCT 38.8* 31.9* 27.7*  MCV 87.8 86.2 86.6  PLT 115* 75* 85*   Cardiac Enzymes:  Recent Labs Lab 12/03/15 0844  TROPONINI <0.03   BNP: BNP (last 3 results) No results for input(s): BNP in the last 8760 hours.  ProBNP (last 3 results) No results for input(s): PROBNP in the last 8760 hours.  CBG:  Recent Labs Lab 11/29/15 2121 11/30/15 0734 12/01/15 0749 12/02/15 0753 12/03/15 0715  GLUCAP 105* 85 82 71 90    Signed:  CHIU, STEPHEN K  [facility] 12/03/2015, 2:00 PM

## 2015-12-04 ENCOUNTER — Telehealth: Payer: Self-pay | Admitting: *Deleted

## 2015-12-04 NOTE — Telephone Encounter (Signed)
Transition Care Management Follow-up Telephone Call   Date discharged? 12/03/15   How have you been since you were released from the hospital? Call pt spoke with wife/pt states he is feeling ok just lil weak   Do you understand why you were in the hospital? YES   Do you understand the discharge instructions? YES   Where were you discharged to? Home   Items Reviewed:  Medications reviewed: YES  Allergies reviewed: YES  Dietary changes reviewed: YES, soft food  Referrals reviewed: YES   Functional Questionnaire:   Activities of Daily Living (ADLs):   She states he are independent in the following: ambulation, bathing and hygiene, feeding, continence, grooming, toileting and dressing States he require assistance with the following: ambulationsometimes   Any transportation issues/concerns?: NO   Any patient concerns? NO   Confirmed importance and date/time of follow-up visits scheduled YES, made 12/06/15  Provider Appointment booked with Terri Piedra  Confirmed with patient if condition begins to worsen call PCP or go to the ER.  Patient was given the office number and encouraged to call back with question or concerns.  : YES

## 2015-12-06 ENCOUNTER — Ambulatory Visit (INDEPENDENT_AMBULATORY_CARE_PROVIDER_SITE_OTHER): Payer: Medicare Other | Admitting: Family

## 2015-12-06 ENCOUNTER — Telehealth: Payer: Self-pay | Admitting: Family

## 2015-12-06 ENCOUNTER — Other Ambulatory Visit (INDEPENDENT_AMBULATORY_CARE_PROVIDER_SITE_OTHER): Payer: Medicare Other

## 2015-12-06 ENCOUNTER — Encounter: Payer: Self-pay | Admitting: Family

## 2015-12-06 VITALS — BP 142/78 | HR 56 | Temp 98.1°F | Resp 18 | Ht 67.0 in | Wt 136.0 lb

## 2015-12-06 DIAGNOSIS — R197 Diarrhea, unspecified: Secondary | ICD-10-CM

## 2015-12-06 DIAGNOSIS — R112 Nausea with vomiting, unspecified: Secondary | ICD-10-CM | POA: Diagnosis not present

## 2015-12-06 LAB — BASIC METABOLIC PANEL
BUN: 27 mg/dL — AB (ref 6–23)
CHLORIDE: 114 meq/L — AB (ref 96–112)
CO2: 20 mEq/L (ref 19–32)
Calcium: 8.6 mg/dL (ref 8.4–10.5)
Creatinine, Ser: 1.81 mg/dL — ABNORMAL HIGH (ref 0.40–1.50)
GFR: 38.16 mL/min — ABNORMAL LOW (ref >60.00)
GLUCOSE: 94 mg/dL (ref 70–99)
POTASSIUM: 4.2 meq/L (ref 3.5–5.1)
Sodium: 144 mEq/L (ref 135–145)

## 2015-12-06 LAB — CBC
HCT: 29.5 % — ABNORMAL LOW (ref 39.0–52.0)
Hemoglobin: 9.6 g/dL — ABNORMAL LOW (ref 13.0–17.0)
MCHC: 32.6 g/dL (ref 30.0–36.0)
MCV: 84.2 fl (ref 78.0–100.0)
PLATELETS: 150 10*3/uL (ref 150.0–400.0)
RBC: 3.5 Mil/uL — AB (ref 4.22–5.81)
RDW: 17.3 % — ABNORMAL HIGH (ref 11.5–15.5)
WBC: 8.1 10*3/uL (ref 4.0–10.5)

## 2015-12-06 NOTE — Assessment & Plan Note (Addendum)
Nausea, vomiting, and diarrhea as a result of neurovirus infection has resolved since leaving the hospital. Continues to eat a normal diet and return to baseline function without fatigue or weakness. Continue previously prescribed home medications. Obtain CBC and basic metabolic profile. No further follow-up pending blood work if normal.

## 2015-12-06 NOTE — Progress Notes (Signed)
Pre visit review using our clinic review tool, if applicable. No additional management support is needed unless otherwise documented below in the visit note. 

## 2015-12-06 NOTE — Progress Notes (Signed)
Subjective:    Patient ID: Nicholas Porter, male    DOB: 10-31-1930, 81 y.o.   MRN: [id number]  Chief Complaint  Patient presents with  . Hospitalization Follow-up    was in the hospital with the noro virus, states he feel much better    HPI:  Nicholas Porter is a 81 y.o. male who  has no past medical history on file. and presents today for an office visit following hospitalization.  Recently evaluated in the emergency room and it does hospital for nausea, vomiting, and diarrhea. He did have previous E. Coli infection. Lab work was significant for elevated creatinine and white blood cell count with shift to the left for concern for infectious diarrhea. Stool studies were positive for normal virus and negative for Clostridium difficile. Supportive care was provided with IV fluids and antiemetics. He was able to tolerate a normal diet prior to discharge. Hypertension remained stable throughout the hospitalization. He was continued on his home medications upon discharge. All hospital records and lab work was reviewed in detail.  Since leaving the hospital he reports that he is feeling good and his appetitie has returned. Reports that he is back to eating a regular diet without complication. Denies any further incidence of nausea, vomiting, or diarrhea. Notes he is back to baseline and able to complete his activities of daily living.   Allergies  Allergen Reactions  . Amlodipine Besylate Hives  . Hydralazine Itching  . Lipitor [Atorvastatin] Itching  . Naproxen Diarrhea  . Oxycodone-Acetaminophen Itching     Current Outpatient Prescriptions on File Prior to Visit  Medication Sig Dispense Refill  . acetaminophen (TYLENOL) 650 MG CR tablet Take 650 mg by mouth every 8 (eight) hours as needed for pain.    . brinzolamide (AZOPT) 1 % ophthalmic suspension Place 1 drop into the left eye every 12 (twelve) hours.     . clopidogrel (PLAVIX) 75 MG tablet Take 1 tablet (75 mg total) by mouth daily.  NEED OV. 30 tablet 0  . isosorbide mononitrate (IMDUR) 30 MG 24 hr tablet TAKE 1 TABLET BY MOUTH EVERY DAY 30 tablet 6  . loperamide (IMODIUM) 2 MG capsule Take 1 capsule (2 mg total) by mouth as needed for diarrhea or loose stools. 30 capsule 0  . losartan (COZAAR) 50 MG tablet TAKE 1 TABLET BY MOUTH EVERY DAY 30 tablet 0  . metoprolol succinate (TOPROL-XL) 25 MG 24 hr tablet Take 1 tablet (25 mg total) by mouth daily. 90 tablet 3  . naphazoline-glycerin (CLEAR EYES) 0.012-0.2 % SOLN Place 1 drop into the left eye every 4 (four) hours as needed for irritation.     . nitroGLYCERIN (NITROSTAT) 0.4 MG SL tablet Place 1 tablet (0.4 mg total) under the tongue every 5 (five) minutes as needed. For chest pain. 25 tablet 6  . ondansetron (ZOFRAN) 4 MG tablet Take 1 tablet (4 mg total) by mouth every 6 (six) hours as needed for nausea. 20 tablet 0  . saccharomyces boulardii (FLORASTOR) 250 MG capsule Take 1 capsule (250 mg total) by mouth 2 (two) times daily. 30 capsule 0  . TRAVATAN Z 0.004 % SOLN ophthalmic solution Place 1 drop into both eyes at bedtime.      No current facility-administered medications on file prior to visit.     Past Surgical History  Procedure Laterality Date  . Foot surgery      left  . Carpal tunnel release    . Hernia repair    .  Total knee arthroplasty    . Carotid endarterectomy Left 2007    Dr.Hayes  . Amputation      left index finger -tramatic  . Vagotomy      partial gastrectomy  . Partial gastrectomy    . Corneal transplant    . Rotator cuff repair      left  . Spine surgery  1985    cervical laminectomy  . Cardiac catheterization  November 2011    40% mid LAD, 50% proximal RCA, 30-40% distal RCA (DOMINANT RCA with wraparound PDA & 2 large PLBs), 60-70% ostial OM1. No change from 2001. Medical therapy  . Shoulder arthroscopy with rotator cuff repair and subacromial decompression Right 07/01/2013    Procedure: RIGHT SHOULDER ARTHROSCOPY WITH  SUBACROMIAL  DECOMPRESSION/DISTAL CLAVICLE RESECTION/ROTATOR CUFF REPAIR ;  Surgeon: Marin Shutter, MD;  Location: [place];  Service: Orthopedics;  Laterality: Right;  . Nm myoview ltd  11/2013; 08/1014    a) 12/'14: EF 59%, Fixed Inferior-Inferoapical Defect c/w Inferior Scar; No Ischemia; b) 9/'15:  9/'15: EF 55% - persistent fixed inferior-inferoapical perfusion defect  - read as diaphragmatic attenuation, likely consistent with infarct/scar   . Left heart catheterization with coronary angiogram N/A 10/05/2013    Procedure: LEFT HEART CATHETERIZATION WITH CORONARY ANGIOGRAM;  Surgeon: Peter M Martinique, MD;  Location: [facility] LAB;  Service: Cardiovascular;  RCA 100% - PCI,CxOM stable, LAD ~70% mid;  . Left heart catheterization with coronary angiogram N/A 06/08/2014    Procedure: LEFT HEART CATHETERIZATION WITH CORONARY ANGIOGRAM;  Surgeon: Troy Sine, MD;  Location: [facility] CATH LAB;  Service: Cardiovascular;  UA 6/'15: 75% ISR in RCA - PTCA, LAD lesion ~40%, CxOM stable.    . Percutaneous coronary intervention-balloon only  06/08/2014    Procedure: PERCUTANEOUS CORONARY INTERVENTION-BALLOON ONLY;  Surgeon: Troy Sine, MD;  Location: [facility] CATH LAB;  Service: Cardiovascular;;  . Coronary angioplasty with stent placement  09/2013; 05/2014     d) Promus DES 3.0 mm x 28 mm (3.2 mm)  to RCA with STEMI; residual 70% mid-distal LAD, OM 270-80%. Medical management; b) PTCA of 75 % ISR , LAD lesion noted as ~40%    No past medical history on file.    Review of Systems  Constitutional: Negative for fever and chills.  Respiratory: Negative for chest tightness and shortness of breath.   Cardiovascular: Negative for chest pain, palpitations and leg swelling.  Gastrointestinal: Negative for nausea, vomiting, abdominal pain, diarrhea, constipation and blood in stool.  Neurological: Negative for dizziness, weakness and headaches.      Objective:    BP 142/78 mmHg  Pulse 56  Temp(Src) 98.1 F (36.7 C) (Oral)  Resp 18   Ht 5\' 7"  (1.702 m)  Wt 136 lb (61.689 kg)  BMI 21.30 kg/m2  SpO2 99% Nursing note and vital signs reviewed.  Physical Exam  Constitutional: He is oriented to person, place, and time. He appears well-developed and well-nourished. No distress.  Cardiovascular: Normal rate, regular rhythm, normal heart sounds and intact distal pulses.   Pulmonary/Chest: Effort normal and breath sounds normal.  Neurological: He is alert and oriented to person, place, and time.  Skin: Skin is warm and dry.  Psychiatric: He has a normal mood and affect. His behavior is normal. Judgment and thought content normal.       Assessment & Plan:   Problem List Items Addressed This Visit      Digestive   Nausea vomiting and diarrhea - Primary  Nausea, vomiting, and diarrhea as a result of neurovirus infection has resolved since leaving the hospital. Continues to eat a normal diet and return to baseline function without fatigue or weakness. Continue previously prescribed home medications. Obtain CBC and basic metabolic profile. No further follow-up pending blood work if normal.      Relevant Orders   CBC (Completed)   Basic metabolic panel (Completed)

## 2015-12-06 NOTE — Patient Instructions (Signed)
Thank you for choosing [facility].  Summary/Instructions:  Please stop by the lab on the basement level of the building for your blood work. Your results will be released to [place] (or called to you) after review, usually within 72 hours after test completion. If any changes need to be made, you will be notified at that same time.  If your symptoms worsen or fail to improve, please contact our office for further instruction, or in case of emergency go directly to the emergency room at the closest medical facility.   Please continue to take your medications as prescribed.  Follow up if symptoms return.

## 2015-12-06 NOTE — Telephone Encounter (Signed)
Please inform patient that his lab results are improved and to follow up as scheduled with Dr. Jenny Reichmann.

## 2015-12-07 NOTE — Telephone Encounter (Signed)
LVM informing pt

## 2015-12-20 ENCOUNTER — Encounter: Payer: Medicare Other | Admitting: Cardiology

## 2015-12-20 ENCOUNTER — Encounter: Payer: Self-pay | Admitting: Cardiology

## 2015-12-22 NOTE — Progress Notes (Signed)
This encounter was created in error - please disregard.

## 2015-12-28 ENCOUNTER — Telehealth: Payer: Self-pay | Admitting: Cardiology

## 2015-12-28 NOTE — Telephone Encounter (Signed)
Returned call to patient's wife.I will send message to Dr.Harding to see if ok to excuse patient from jury duty.

## 2015-12-28 NOTE — Telephone Encounter (Signed)
Spoke to patient letter to excuse him for jury duty left at front desk for pick up.

## 2015-12-28 NOTE — Telephone Encounter (Signed)
Pt needs a letter stating that he is unable to do jury duty.If you do not ger her at first number,please call after 11:oo [phone].

## 2015-12-28 NOTE — Telephone Encounter (Signed)
In my experience- judges tend to not accept these notes. If there is a particular wording, it may work, but I doubt it.   [place]

## 2016-01-02 ENCOUNTER — Other Ambulatory Visit: Payer: Self-pay | Admitting: Cardiology

## 2016-01-02 NOTE — Telephone Encounter (Signed)
Rx request sent to pharmacy.  

## 2016-01-09 ENCOUNTER — Ambulatory Visit: Payer: Medicare Other | Admitting: Internal Medicine

## 2016-01-10 ENCOUNTER — Ambulatory Visit: Payer: Medicare Other | Admitting: Internal Medicine

## 2016-01-17 ENCOUNTER — Ambulatory Visit: Payer: Medicare Other | Admitting: Internal Medicine

## 2016-01-23 ENCOUNTER — Other Ambulatory Visit: Payer: Self-pay | Admitting: Cardiology

## 2016-01-30 ENCOUNTER — Ambulatory Visit (INDEPENDENT_AMBULATORY_CARE_PROVIDER_SITE_OTHER): Payer: Medicare Other | Admitting: Cardiology

## 2016-01-30 VITALS — BP 158/60 | HR 60 | Ht 67.0 in | Wt 122.6 lb

## 2016-01-30 DIAGNOSIS — I2119 ST elevation (STEMI) myocardial infarction involving other coronary artery of inferior wall: Secondary | ICD-10-CM

## 2016-01-30 DIAGNOSIS — E43 Unspecified severe protein-calorie malnutrition: Secondary | ICD-10-CM

## 2016-01-30 DIAGNOSIS — I10 Essential (primary) hypertension: Secondary | ICD-10-CM

## 2016-01-30 DIAGNOSIS — I2510 Atherosclerotic heart disease of native coronary artery without angina pectoris: Secondary | ICD-10-CM | POA: Diagnosis not present

## 2016-01-30 DIAGNOSIS — Z9861 Coronary angioplasty status: Secondary | ICD-10-CM

## 2016-01-30 MED ORDER — NITROGLYCERIN 0.4 MG SL SUBL: 0.4000 mg | SUBLINGUAL_TABLET | SUBLINGUAL | Status: DC | PRN

## 2016-01-30 NOTE — Progress Notes (Signed)
PCP: Cathlean Cower, MD  Clinic Note: Chief Complaint  Patient presents with  . Follow-up    pt c/o weight loss--lost 10lb since june, SOB on exertion  . Coronary Artery Disease    HPI: Nicholas Porter is a 82 y.o. male with a PMH below who presents today for 6 month f/u - CAD-PCI (h/o STEMI), mild AS. Last seen Dec 2015.  Cardiac cath 05/2014 - PTCA of RCA ISR. Myoview 08/2014: Low risk stress nuclear study Diaphragmatic attenuation artifact.; Normal LV Function / wall motion. No Infarct/ischemia.  Nicholas Porter was last seen in June 2016; noted that it gives out easier than usual. Otherwise no angina symptoms. He had some angina decubitus, but not exertional.  Recent Hospitalizations: Aug 2016 with infection. (Norovirus)  Studies Reviewed: none  Interval History: Abnormality presents today really without any major cardiac symptoms. The biggest complaint he has is that he has lost a significant amount of weight over the last several months. He has a hard time even try to keep any weight on. He doesn't think he isn't eating a more unusual, just doesn't have good appetite. Hasn't noticed any fatigue or fever/chills. Overall pretty stable from a cardiac standpoint. No further angina decubitus symptoms. Continues to note some exercise intolerance. But no angina. He has stable exertional dyspnea, but not anything more than was usual for him. No resting or exertional chest tightness or pressure. No heart failure symptoms of PND, orthopnea or edema.  No palpitations, lightheadedness, dizziness, weakness or syncope/near syncope. No TIA/amaurosis fugax symptoms. No melena, hematochezia, hematuria, or epstaxis. No claudication.  ROS: A comprehensive was performed. Review of Systems  Constitutional: Positive for weight loss. Negative for malaise/fatigue.       Does not really note reduced appetite  HENT: Negative for nosebleeds.   Respiratory: Negative for cough, shortness of breath and  wheezing.   Gastrointestinal: Negative for blood in stool and melena.  Genitourinary: Negative for hematuria.  Musculoskeletal: Positive for back pain, joint pain (mild arthralgias.) and neck pain. Negative for myalgias.  Neurological: Positive for dizziness (Positional.). Negative for tingling, tremors, sensory change, speech change, focal weakness, seizures, loss of consciousness, weakness and headaches.  Psychiatric/Behavioral: Negative.   All other systems reviewed and are negative.   History reviewed. No pertinent past medical history.  Past Surgical History  Procedure Laterality Date  . Foot surgery      left  . Carpal tunnel release    . Hernia repair    . Total knee arthroplasty    . Carotid endarterectomy Left 2007    Dr.Hayes  . Amputation      left index finger -tramatic  . Vagotomy      partial gastrectomy  . Partial gastrectomy    . Corneal transplant    . Rotator cuff repair      left  . Spine surgery  1985    cervical laminectomy  . Cardiac catheterization  November 2011    40% mid LAD, 50% proximal RCA, 30-40% distal RCA (DOMINANT RCA with wraparound PDA & 2 large PLBs), 60-70% ostial OM1. No change from 2001. Medical therapy  . Shoulder arthroscopy with rotator cuff repair and subacromial decompression Right 07/01/2013    Procedure: RIGHT SHOULDER ARTHROSCOPY WITH  SUBACROMIAL DECOMPRESSION/DISTAL CLAVICLE RESECTION/ROTATOR CUFF REPAIR ;  Surgeon: Marin Shutter, MD;  Location: [place];  Service: Orthopedics;  Laterality: Right;  . Nm myoview ltd  11/2013; 08/1014    a) 12/'14: EF 59%, Fixed Inferior-Inferoapical Defect c/w Inferior  Scar; No Ischemia; b) 9/'15:  9/'15: EF 55% - persistent fixed inferior-inferoapical perfusion defect  - read as diaphragmatic attenuation, likely consistent with infarct/scar   . Left heart catheterization with coronary angiogram N/A 10/05/2013    Procedure: LEFT HEART CATHETERIZATION WITH CORONARY ANGIOGRAM;  Surgeon: Peter M Martinique,  MD;  Location: [facility] CATH LAB;  Service: Cardiovascular;  RCA 100% - PCI,CxOM stable, LAD ~70% mid;  . Left heart catheterization with coronary angiogram N/A 06/08/2014    Procedure: LEFT HEART CATHETERIZATION WITH CORONARY ANGIOGRAM;  Surgeon: Troy Sine, MD;  Location: [facility] CATH LAB;  Service: Cardiovascular;  UA 6/'15: 75% ISR in RCA - PTCA, LAD lesion ~40%, CxOM stable.    . Percutaneous coronary intervention-balloon only  06/08/2014    Procedure: PERCUTANEOUS CORONARY INTERVENTION-BALLOON ONLY;  Surgeon: Troy Sine, MD;  Location: [facility] CATH LAB;  Service: Cardiovascular;;  . Coronary angioplasty with stent placement  09/2013; 05/2014     d) Promus DES 3.0 mm x 28 mm (3.2 mm)  to RCA with STEMI; residual 70% mid-distal LAD, OM 270-80%. Medical management; b) PTCA of 75 % ISR , LAD lesion noted as ~40%   Prior to Admission medications   Medication Sig Start Date End Date Taking? Authorizing Provider  brinzolamide (AZOPT) 1 % ophthalmic suspension Place 1 drop into the left eye every 12 (twelve) hours.    Yes Historical Provider, MD  clopidogrel (PLAVIX) 75 MG tablet TAKE 1 TABLET (75 MG TOTAL) BY MOUTH DAILY. NEED OV. 01/02/16  Yes Leonie Man, MD  losartan (COZAAR) 50 MG tablet TAKE 1 TABLET BY MOUTH EVERY DAY 01/23/16  Yes Leonie Man, MD  metoprolol succinate (TOPROL-XL) 25 MG 24 hr tablet Take 1 tablet (25 mg total) by mouth daily. 06/13/15  Yes Leonie Man, MD  nitroGLYCERIN (NITROSTAT) 0.4 MG SL tablet Place 1 tablet (0.4 mg total) under the tongue every 5 (five) minutes as needed. For chest pain. 06/02/14  Yes Leonie Man, MD  TRAVATAN Z 0.004 % SOLN ophthalmic solution Place 1 drop into both eyes at bedtime.  04/20/13  Yes Historical Provider, MD   Allergies  Allergen Reactions  . Amlodipine Besylate Hives  . Hydralazine Itching  . Lipitor [Atorvastatin] Itching  . Naproxen Diarrhea  . Oxycodone-Acetaminophen Itching    Social History   Social History  . Marital  Status: Married    Spouse Name: N/A  . Number of Children: N/A  . Years of Education: N/A   Occupational History  .      Retired   Social History Main Topics  . Smoking status: Former Smoker    Types: Cigarettes    Quit date: 12/23/1964  . Smokeless tobacco: Never Used  . Alcohol Use: No  . Drug Use: No  . Sexual Activity: Not Asked   Other Topics Concern  . None   Social History Narrative   He is married with 2 children. He lives with his wife.   Very active at home, prior to a STEMI, he could walk several miles without discomfort. Prior to his MI, he would use his push mower to mow his entire lawn.   He is a former smoker and quit back in 1966. He does not drink alcohol   Family History  Problem Relation Age of Onset  . Heart disease Sister   . Diabetes Sister     Wt Readings from Last 3 Encounters:  01/30/16 122 lb 9.6 oz (55.611 kg)  12/20/15 126 lb (  57.153 kg)  12/06/15 136 lb (61.689 kg)  -- Drinks plenty, but not eating as well.  PHYSICAL EXAM BP 158/60 mmHg  Pulse 60  Ht 5\' 7"  (1.702 m)  Wt 122 lb 9.6 oz (55.611 kg)  BMI 19.20 kg/m2 General: alert, cooperative, appears stated age, no distress and Well-groomed. Well-nourished. Normal mood and affect. Answers questions appropriately, but tends to ramble &get off track.  HEENT: [place]/AT, EOMI, MMM, anicteric sclera; arcus senilis -- several tissues with streaks of bloody mucous is noted in the trash can (some from his nose & some that he "spit-up" -- not cough) Neck: no adenopathy, no JVD, supple, symmetrical; No LAN. Soft left-sided carotid bruit.  Lungs: mild interstitial rhonchi, but no Wheeze or Rales, normal percussion bilaterally and Nonlabored, good air movement  Heart: RRR, normal S1 and S2. Soft 1-2/6 early peaking c-d SEM at RUSB as well as ~1/6 DM in same location  Abdomen: Soft with mild right upper quadrant and epigastric tenderness. NABS. No rebound  Extremities: extremities normal, atraumatic,  no cyanosis or edema  Pulses: 2+ and symmetric  Skin: Skin color, texture, turgor normal. No rashes or lesions or Just mild bruising from being on antiplatelet agents.  Neurologic: Grossly normal    Adult ECG Report None checked   Other studies Reviewed: Additional studies/ records that were reviewed today include:  Recent Labs:   Lab Results  Component Value Date   CHOL 122 07/20/2015   HDL 37.90* 07/20/2015   LDLCALC 63 07/20/2015   TRIG 106.0 07/20/2015   CHOLHDL 3 07/20/2015    ASSESSMENT / PLAN: Problem List Items Addressed This Visit    Protein-calorie malnutrition, severe ([place]) (Chronic)    He needs to supplement his diet. We talked about using either boost, insurer or even [employer]. He needs at least 3 per day as well as one snack. He can even use smoothies if necessary as long as he uses yogurt. I think that'll help with his overall energy level.  Unconcern restarting have some failure to thrive.      H/O Inferior STEMI (09/2013) emergent PCI with Promus DES to RCA (3.0 mm  x 28 mm - 3.2 mm) (Chronic)    Myoview from September 2015 shows a persistent fixed inferior-inferoapical perfusion defect that was read as diaphragmatic attenuation think is probably consistent with prior infarct. Preserved EF however. The Myoview from 2014 was read as inferior scar. No heart failure symptoms. No arrhythmias.      Relevant Medications   nitroGLYCERIN (NITROSTAT) 0.4 MG SL tablet   Essential hypertension (Chronic)    This seems to be relatively stable for him. Somewhat elevated, but I'm reluctant to push too hard. With his exertional fatigue, blood in the post beta blocker. We could push the ARB dose higher. He would prefer not to adjust his medications.      Relevant Medications   nitroGLYCERIN (NITROSTAT) 0.4 MG SL tablet   CAD S/P percutaneous coronary angioplasty - DES in RCA with PTCA for ISR; Moderate mLAD, 80% ostial OM2 stable - Primary (Chronic)     Doing relatively well without any significant angina. Negative Myoview from September 2015 following his last PCI. He really isn't complaining as much about any exertional dyspnea or exercise intolerance is much as before.  Plan: Continue Plavix alone without aspirin, losartan plus Toprol. When necessary nitroglycerin. He was on Imdur, but that was stopped because of headache. No further angina decubitus  Not on statin. His cholesterol levels look great,  probably because of his significant weight loss.      Relevant Medications   nitroGLYCERIN (NITROSTAT) 0.4 MG SL tablet      Current medicines are reviewed at length with the patient today. (+/- concerns) none The following changes have been made:  NEED TO SUPPLEMENT YOUR DIET WITH- BOOST,CARNATION BREAKFAST, OR SMOOTHIES WITH YOGURT AS SNACKS IN BETWEEN MEALS.  TALK WITH WITH DR Jenny Reichmann ABOUT YOUR WEIGHT LOSS   NO OTHER CHANGES WITH MEDICATIONS.  Your physician wants you to follow-up in [place].  Studies Ordered:   No orders of the defined types were placed in this encounter.      Leonie Man, M.D., M.S. Interventional Cardiologist   Pager # [phone] Phone # [phone] [address] [place], [place] [address]

## 2016-01-30 NOTE — Patient Instructions (Signed)
NEED TO SUPPLEMENT YOUR DIET WITH- BOOST,CARNATION BREAKFAST, OR SMOOTHIES WITH YOGURT AS SNACKS IN BETWEEN MEALS.  TALK WITH WITH DR Jenny Reichmann ABOUT YOUR WEIGHT LOSS   NO OTHER CHANGES WITH MEDICATIONS.  Your physician wants you to follow-up in [place].  You will receive a reminder letter in the mail two months in advance. If you don't receive a letter, please call our office to schedule the follow-up appointment.  If you need a refill on your cardiac medications before your next appointment, please call your pharmacy.

## 2016-02-02 ENCOUNTER — Encounter: Payer: Self-pay | Admitting: Cardiology

## 2016-02-02 NOTE — Assessment & Plan Note (Signed)
This seems to be relatively stable for him. Somewhat elevated, but I'm reluctant to push too hard. With his exertional fatigue, blood in the post beta blocker. We could push the ARB dose higher. He would prefer not to adjust his medications.

## 2016-02-02 NOTE — Assessment & Plan Note (Addendum)
Doing relatively well without any significant angina. Negative Myoview from September 2015 following his last PCI. He really isn't complaining as much about any exertional dyspnea or exercise intolerance is much as before.  Plan: Continue Plavix alone without aspirin, losartan plus Toprol. When necessary nitroglycerin. He was on Imdur, but that was stopped because of headache. No further angina decubitus  Not on statin. His cholesterol levels look great, probably because of his significant weight loss.

## 2016-02-02 NOTE — Assessment & Plan Note (Signed)
He needs to supplement his diet. We talked about using either boost, insurer or even [employer]. He needs at least 3 per day as well as one snack. He can even use smoothies if necessary as long as he uses yogurt. I think that'll help with his overall energy level.  Unconcern restarting have some failure to thrive.

## 2016-02-02 NOTE — Assessment & Plan Note (Signed)
Myoview from September 2015 shows a persistent fixed inferior-inferoapical perfusion defect that was read as diaphragmatic attenuation think is probably consistent with prior infarct. Preserved EF however. The Myoview from 2014 was read as inferior scar. No heart failure symptoms. No arrhythmias.

## 2016-02-13 DIAGNOSIS — H401132 Primary open-angle glaucoma, bilateral, moderate stage: Secondary | ICD-10-CM | POA: Diagnosis not present

## 2016-02-27 ENCOUNTER — Other Ambulatory Visit: Payer: Self-pay | Admitting: Cardiology

## 2016-04-27 ENCOUNTER — Other Ambulatory Visit: Payer: Self-pay | Admitting: Cardiology

## 2016-04-29 NOTE — Telephone Encounter (Signed)
Rx request sent to pharmacy.  

## 2016-05-15 DIAGNOSIS — H401132 Primary open-angle glaucoma, bilateral, moderate stage: Secondary | ICD-10-CM | POA: Diagnosis not present

## 2016-05-24 ENCOUNTER — Other Ambulatory Visit: Payer: Self-pay

## 2016-05-24 MED ORDER — METOPROLOL SUCCINATE ER 25 MG PO TB24: 25.0000 mg | ORAL_TABLET | Freq: Every day | ORAL | Status: DC

## 2016-08-21 DIAGNOSIS — H401132 Primary open-angle glaucoma, bilateral, moderate stage: Secondary | ICD-10-CM | POA: Diagnosis not present

## 2016-08-30 ENCOUNTER — Other Ambulatory Visit: Payer: Self-pay

## 2016-08-30 ENCOUNTER — Other Ambulatory Visit: Payer: Self-pay | Admitting: Cardiology

## 2016-08-30 MED ORDER — LOSARTAN POTASSIUM 50 MG PO TABS: 50.0000 mg | ORAL_TABLET | Freq: Every day | ORAL | 5 refills | Status: DC

## 2016-09-03 ENCOUNTER — Encounter: Payer: Self-pay | Admitting: Cardiology

## 2016-09-03 ENCOUNTER — Ambulatory Visit (INDEPENDENT_AMBULATORY_CARE_PROVIDER_SITE_OTHER): Payer: Medicare Other | Admitting: Cardiology

## 2016-09-03 VITALS — BP 142/68 | HR 54 | Ht 66.0 in | Wt 126.4 lb

## 2016-09-03 DIAGNOSIS — I10 Essential (primary) hypertension: Secondary | ICD-10-CM

## 2016-09-03 DIAGNOSIS — I779 Disorder of arteries and arterioles, unspecified: Secondary | ICD-10-CM | POA: Diagnosis not present

## 2016-09-03 DIAGNOSIS — I340 Nonrheumatic mitral (valve) insufficiency: Secondary | ICD-10-CM | POA: Insufficient documentation

## 2016-09-03 DIAGNOSIS — I739 Peripheral vascular disease, unspecified: Secondary | ICD-10-CM

## 2016-09-03 DIAGNOSIS — I2119 ST elevation (STEMI) myocardial infarction involving other coronary artery of inferior wall: Secondary | ICD-10-CM | POA: Diagnosis not present

## 2016-09-03 DIAGNOSIS — R0609 Other forms of dyspnea: Secondary | ICD-10-CM

## 2016-09-03 DIAGNOSIS — I2510 Atherosclerotic heart disease of native coronary artery without angina pectoris: Secondary | ICD-10-CM | POA: Diagnosis not present

## 2016-09-03 DIAGNOSIS — Z9861 Coronary angioplasty status: Secondary | ICD-10-CM

## 2016-09-03 NOTE — Patient Instructions (Signed)
Medications:  Your physician recommends that you continue on your current medications as directed. Please refer to the Current Medication list given to you today.   Testing/Procedures:  Your physician has requested that you have an echocardiogram. Echocardiography is a painless test that uses sound waves to create images of your heart. It provides your doctor with information about the size and shape of your heart and how well your heart's chambers and valves are working. This procedure takes approximately one hour. There are no restrictions for this procedure.at [place]. [address].   Your physician has requested that you have a carotid duplex. This test is an ultrasound of the carotid arteries in your neck. It looks at blood flow through these arteries that supply the brain with blood. Allow one hour for this exam. There are no restrictions or special instructions.    Follow-Up:  Your physician wants you to follow-up in: 6 months with Dr. Ellyn Hack. You will receive a reminder letter in the mail two months in advance. If you don't receive a letter, please call our office to schedule the follow-up appointment.  If you need a refill on your cardiac medications before your next appointment, please call your pharmacy.

## 2016-09-03 NOTE — Progress Notes (Signed)
PCP: Cathlean Cower, MD  Clinic Note: Chief Complaint  Patient presents with  . Chest Pain    pt states he can get SOB and it causes his chest to hurt   . Follow-up   Problem List: 1. Dyspnea on exertion   2. CAD S/P percutaneous coronary angioplasty - DES in RCA with PTCA for ISR; Moderate mLAD, 80% ostial OM2 stable   3. H/O Inferior STEMI (09/2013) emergent PCI with Promus DES to RCA (3.0 mm  x 28 mm - 3.2 mm)   4. Left-sided carotid artery disease -- s/p CEA   5. Essential hypertension   6. Mitral regurgitation - onexam     HPI: Nicholas Porter is a 82 y.o. male with a PMH below who presents today for 6 month f/u - CAD-PCI (h/o STEMI), mild AS. Last seen Dec 2015.  Cardiac cath 05/2014 - PTCA of RCA ISR. Myoview 08/2014: Low risk stress nuclear study Diaphragmatic attenuation artifact.; Normal LV Function / wall motion. No Infarct/ischemia.  Nicholas Porter was last seen in February 2017; noted that it gives out easier than usual.  Notice significant weight loss. Poor appetite. No further angina decubitus.  Recent Hospitalizations: None  Studies Reviewed: none  Interval History: Nicholas Porter presents today really without any major cardiac symptoms.  Feels better -- " couldn't be happier" Still gets DOE walking down the driveway to the street & taking out garbage -- He mentioned to the CNA that if he exerts himself to the point of getting really short of breath that he may note some chest discomfort, but he did not mention this to me. He doesn't have it with routine exercise.    More stable appetite. Hasn't noticed any fatigue or fever/chills. Overall pretty stable from a cardiac standpoint. No further angina decubitus symptoms. Continues to note some exercise intolerance. But no angina. He has stable exertional dyspnea, but not anything more than was usual for him. No resting or exertional chest tightness or pressure. No heart failure symptoms of PND, orthopnea or edema.  No  palpitations, lightheadedness, dizziness, weakness or syncope/near syncope. No TIA/amaurosis fugax symptoms. No melena, hematochezia, hematuria, or epstaxis. No claudication.  ROS: A comprehensive was performed. Review of Systems  Constitutional: Negative for malaise/fatigue and weight loss (stable weight).       Does not really note reduced appetite  HENT: Negative for nosebleeds.   Respiratory: Negative for cough, shortness of breath and wheezing.   Gastrointestinal: Negative for blood in stool and melena.  Genitourinary: Negative for hematuria.  Musculoskeletal: Positive for back pain, joint pain (mild arthralgias.) and neck pain. Negative for myalgias.  Neurological: Positive for dizziness (Positional.). Negative for tingling, tremors, sensory change, speech change, focal weakness, seizures, loss of consciousness, weakness and headaches.  Psychiatric/Behavioral: Negative.   All other systems reviewed and are negative.   Past Medical History:  Diagnosis Date  . Anemia of chronic renal failure, stage 4 (severe) ([place]) 11/27/2015  . 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 CAD S/P percutaneous coronary angioplasty - DES in RCA with PTCA for ISR; Moderate mLAD, 80% ostial OM2 stable 05/22/2008   Qualifier: Diagnosis of  By: Linda Hedges MD, Heinz Knuckles   . CKD (chronic kidney disease) stage 3, GFR 30-59 ml/min   . Essential hypertension 06/30/2007   Qualifier: Diagnosis of  By: Cori Razor RN, Kyle H/O Inferior STEMI (09/2013) emergent PCI with Promus DES  to RCA (3.0 mm  x 28 mm - 3.2 mm) 10/05/2013  . Left-sided carotid artery disease -- s/p CEA 04/06/2006   S/p Left CEA   . Mitral regurgitation - onexam 09/03/2016    Past Surgical History:  Procedure Laterality Date  . AMPUTATION     left index finger -tramatic  . CARDIAC CATHETERIZATION  November 2011   40% mid LAD, 50% proximal RCA, 30-40% distal  RCA (DOMINANT RCA with wraparound PDA & 2 large PLBs), 60-70% ostial OM1. No change from 2001. Medical therapy  . CAROTID ENDARTERECTOMY Left 2007   Dr.Hayes  . CARPAL TUNNEL RELEASE    . CORNEAL TRANSPLANT    . CORONARY ANGIOPLASTY WITH STENT PLACEMENT  09/2013; 05/2014    d) Promus DES 3.0 mm x 28 mm (3.2 mm)  to RCA with STEMI; residual 70% mid-distal LAD, OM 270-80%. Medical management; b) PTCA of 75 % ISR , LAD lesion noted as ~40%  . FOOT SURGERY     left  . HERNIA REPAIR    . LEFT HEART CATHETERIZATION WITH CORONARY ANGIOGRAM N/A 10/05/2013   Procedure: LEFT HEART CATHETERIZATION WITH CORONARY ANGIOGRAM;  Surgeon: Peter M Martinique, MD;  Location: [facility] CATH LAB;  Service: Cardiovascular;  RCA 100% - PCI,CxOM stable, LAD ~70% mid;  . LEFT HEART CATHETERIZATION WITH CORONARY ANGIOGRAM N/A 06/08/2014   Procedure: LEFT HEART CATHETERIZATION WITH CORONARY ANGIOGRAM;  Surgeon: Troy Sine, MD;  Location: [facility] CATH LAB;  Service: Cardiovascular;  UA 6/'15: 75% ISR in RCA - PTCA, LAD lesion ~40%, CxOM stable.    Marland Kitchen NM MYOVIEW LTD  11/2013; 08/1014   a) 12/'14: EF 59%, Fixed Inferior-Inferoapical Defect c/w Inferior Scar; No Ischemia; b) 9/'15:  9/'15: EF 55% - persistent fixed inferior-inferoapical perfusion defect  - read as diaphragmatic attenuation, likely consistent with infarct/scar   . PARTIAL GASTRECTOMY    . PERCUTANEOUS CORONARY INTERVENTION-BALLOON ONLY  06/08/2014   Procedure: PERCUTANEOUS CORONARY INTERVENTION-BALLOON ONLY;  Surgeon: Troy Sine, MD;  Location: [facility] CATH LAB;  Service: Cardiovascular;;  . ROTATOR CUFF REPAIR     left  . SHOULDER ARTHROSCOPY WITH ROTATOR CUFF REPAIR AND SUBACROMIAL DECOMPRESSION Right 07/01/2013   Procedure: RIGHT SHOULDER ARTHROSCOPY WITH  SUBACROMIAL DECOMPRESSION/DISTAL CLAVICLE RESECTION/ROTATOR CUFF REPAIR ;  Surgeon: Marin Shutter, MD;  Location: [facility];  Service: Orthopedics;  Laterality: Right;  . SPINE SURGERY  1985   cervical laminectomy  . TOTAL  KNEE ARTHROPLASTY    . VAGOTOMY     partial gastrectomy    Prior to Admission medications   Medication Sig Start Date End Date Taking? Authorizing Provider  brinzolamide (AZOPT) 1 % ophthalmic suspension Place 1 drop into the left eye every 12 (twelve) hours.    Yes Historical Provider, MD  clopidogrel (PLAVIX) 75 MG tablet Take 1 tablet (75 mg total) by mouth daily. 04/29/16  Yes Leonie Man, MD  losartan (COZAAR) 50 MG tablet Take 1 tablet (50 mg total) by mouth daily. 08/30/16  Yes Leonie Man, MD  metoprolol succinate (TOPROL-XL) 25 MG 24 hr tablet Take 1 tablet (25 mg total) by mouth daily. 05/24/16  Yes Leonie Man, MD  nitroGLYCERIN (NITROSTAT) 0.4 MG SL tablet Place 1 tablet (0.4 mg total) under the tongue every 5 (five) minutes as needed. For chest pain. 01/30/16  Yes Leonie Man, MD  TRAVATAN Z 0.004 % SOLN ophthalmic solution Place 1 drop into both eyes at bedtime.  04/20/13  Yes Historical Provider, MD  Allergies  Allergen Reactions  . Amlodipine Besylate Hives  . Hydralazine Itching  . Lipitor [Atorvastatin] Itching  . Naproxen Diarrhea  . Oxycodone-Acetaminophen Itching    Social History   Social History  . Marital status: Married    Spouse name: N/A  . Number of children: N/A  . Years of education: N/A   Occupational History  .  Retired    Retired   Social History Main Topics  . Smoking status: Former Smoker    Types: Cigarettes    Quit date: 12/23/1964  . Smokeless tobacco: Never Used  . Alcohol use No  . Drug use: No  . Sexual activity: Not Asked   Other Topics Concern  . None   Social History Narrative   He is married with 2 children. He lives with his wife.   Very active at home, prior to a STEMI, he could walk several miles without discomfort. Prior to his MI, he would use his push mower to mow his entire lawn.   He is a former smoker and quit back in 1966. He does not drink alcohol   Family History  Problem Relation Age of Onset  .  Heart disease Sister   . Diabetes Sister     Wt Readings from Last 3 Encounters:  09/03/16 126 lb 6.4 oz (57.3 kg)  01/30/16 122 lb 9.6 oz (55.6 kg)  12/20/15 126 lb (57.2 kg)  -- Drinks plenty, but not eating as well. Stable    PHYSICAL EXAM BP (!) 142/68   Pulse (!) 54   Ht 5\' 6"  (1.676 m)   Wt 126 lb 6.4 oz (57.3 kg)   BMI 20.40 kg/m  General: alert, cooperative, appears stated age, no distress and Well-groomed. Well-nourished. Normal mood and affect. Answers questions appropriately, but tends to ramble &get off track.  HEENT: [place]/AT, EOMI, MMM, anicteric sclera; arcus senilis -- several tissues with streaks of bloody mucous is noted in the trash can (some from his nose & some that he "spit-up" -- not cough) Neck: no adenopathy, no JVD, supple, symmetrical; No LAN. louder left-sided carotid bruit.  Lungs: CTAB, normal percussion bilaterally and Nonlabored, good air movement  Heart: RRR, normal S1 and S2. Soft 1-2/6 early peaking c-d SEM at RUSB as well as ~1/6 DM in same location - now 3/6 HSM @ apex Abdomen: Soft with mild right upper quadrant and epigastric tenderness. NABS. No rebound  Extremities: extremities normal, atraumatic, no cyanosis or edema  Pulses: 2+ and symmetric  Skin: Skin color, texture, turgor normal. No rashes or lesions or Just mild bruising from being on antiplatelet agents.  Neurologic: Grossly normal    Adult ECG Report S Brady - 54, LVH by voltage, AnteroSeptal MI - Age undetermined, ST-T wave and [place], consider inferior ischemia versus early re-pole. This may be a new finding, but not specific. Otherwise stable EKG   Other studies Reviewed: Additional studies/ records that were reviewed today include:  Recent Labs:  Due for recheck Lab Results  Component Value Date   CHOL 122 07/20/2015   HDL 37.90 (L) 07/20/2015   LDLCALC 63 07/20/2015   TRIG 106.0 07/20/2015   CHOLHDL 3 07/20/2015    ASSESSMENT / PLAN: Problem List Items  Addressed This Visit    Mitral regurgitation - on exam    I think is the first time I really heard what sounds like a holosystolic murmur. He always had a little bit of the aortic murmur from sclerosis/mild regurg. I would like to  assess his valvular function with an echocardiogram.      Relevant Orders   ECHOCARDIOGRAM COMPLETE   EKG 12-Lead (Completed)   Left-sided carotid artery disease -- s/p CEA (Chronic)    He had a left carotid endarterectomy. I do hear left-sided bruit. He will need to have carotid Dopplers ordered to reassess.      Relevant Orders   VAS US CAROTID   EKG 12-Lead (Completed)   H/O Inferior STEMI (09/2013) emergent PCI with Promus DES to RCA (3.0 mm  x 28 mm - 3.2 mm) (Chronic)    Persistent fixed inferior-inferoapical defect noted on Myoview as of September 2015 that I think was probably more consistent with the previously described inferior infarct. He is not really having significant heart failure symptoms at this point besides some exertional dyspnea. On stable regimen.      Relevant Orders   EKG 12-Lead (Completed)   Essential hypertension (Chronic)   Dyspnea on exertion - Primary (Chronic)    A little bit more exertional dyspnea that he previously noted. With a more prominent HSM on exam, on the go ahead and check a 2-D echocardiogram to assess EF and for any wall motion and amount is. We can reevaluate based on this result and on his symptoms as whether or not we proceed with a stress test or other ischemic evaluation.      CAD S/P percutaneous coronary angioplasty - DES in RCA with PTCA for ISR; Moderate mLAD, 80% ostial OM2 stable (Chronic)    He seems to be doing pretty well, and a little bit concerned with the more pronounced exertional dyspnea these been having. He did have a negative Myoview 2 years ago following his last PCI. We are evaluating his exertional dyspnea and murmur with an echocardiogram. Provided this shows stable cardiac function with  no regional wall motion abnormality is, I think I would probably not pursue a stress test. We can reassess in follow-up. If symptoms progress, may need to reevaluate for ischemia.  Plan: You Plavix alone along with Toprol and Cozaar. When necessary nitroglycerin Did not tolerate Imdur because of headache. He is not having angina decubitus now. Not on statin, but he has had significant weight loss and his last lipids look good.      Relevant Orders   EKG 12-Lead (Completed)    Other Visit Diagnoses   None.     Current medicines are reviewed at length with the patient today. (+/- concerns) none The following changes have been made:  NO OTHER CHANGES WITH MEDICATIONS.  Your physician wants you to follow-up in [place].  Studies Ordered:   Orders Placed This Encounter  Procedures  . EKG 12-Lead  . ECHOCARDIOGRAM COMPLETE   Carotid Dopplers    Glenetta Hew, M.D., M.S. Interventional Cardiologist   Pager # [phone] Phone # [phone] [address] [place], [place] [address]

## 2016-09-05 ENCOUNTER — Encounter: Payer: Self-pay | Admitting: Cardiology

## 2016-09-05 DIAGNOSIS — R0602 Shortness of breath: Secondary | ICD-10-CM | POA: Insufficient documentation

## 2016-09-05 NOTE — Assessment & Plan Note (Addendum)
Persistent fixed inferior-inferoapical defect noted on Myoview as of September 2015 that I think was probably more consistent with the previously described inferior infarct. He is not really having significant heart failure symptoms at this point besides some exertional dyspnea. On stable regimen.

## 2016-09-05 NOTE — Assessment & Plan Note (Signed)
I think is the first time I really heard what sounds like a holosystolic murmur. He always had a little bit of the aortic murmur from sclerosis/mild regurg. I would like to assess his valvular function with an echocardiogram.

## 2016-09-05 NOTE — Assessment & Plan Note (Signed)
He had a left carotid endarterectomy. I do hear left-sided bruit. He will need to have carotid Dopplers ordered to reassess.

## 2016-09-05 NOTE — Assessment & Plan Note (Signed)
He seems to be doing pretty well, and a little bit concerned with the more pronounced exertional dyspnea these been having. He did have a negative Myoview 2 years ago following his last PCI. We are evaluating his exertional dyspnea and murmur with an echocardiogram. Provided this shows stable cardiac function with no regional wall motion abnormality is, I think I would probably not pursue a stress test. We can reassess in follow-up. If symptoms progress, may need to reevaluate for ischemia.  Plan: You Plavix alone along with Toprol and Cozaar. When necessary nitroglycerin Did not tolerate Imdur because of headache. He is not having angina decubitus now. Not on statin, but he has had significant weight loss and his last lipids look good.

## 2016-09-05 NOTE — Assessment & Plan Note (Signed)
A little bit more exertional dyspnea that he previously noted. With a more prominent HSM on exam, on the go ahead and check a 2-D echocardiogram to assess EF and for any wall motion and amount is. We can reevaluate based on this result and on his symptoms as whether or not we proceed with a stress test or other ischemic evaluation.

## 2016-09-17 ENCOUNTER — Ambulatory Visit (HOSPITAL_COMMUNITY): Payer: Medicare Other | Attending: Cardiology

## 2016-09-17 ENCOUNTER — Other Ambulatory Visit: Payer: Self-pay

## 2016-09-17 DIAGNOSIS — I059 Rheumatic mitral valve disease, unspecified: Secondary | ICD-10-CM | POA: Diagnosis present

## 2016-09-17 DIAGNOSIS — I10 Essential (primary) hypertension: Secondary | ICD-10-CM | POA: Insufficient documentation

## 2016-09-17 DIAGNOSIS — I252 Old myocardial infarction: Secondary | ICD-10-CM | POA: Insufficient documentation

## 2016-09-17 DIAGNOSIS — I352 Nonrheumatic aortic (valve) stenosis with insufficiency: Secondary | ICD-10-CM | POA: Insufficient documentation

## 2016-09-17 DIAGNOSIS — I2510 Atherosclerotic heart disease of native coronary artery without angina pectoris: Secondary | ICD-10-CM | POA: Insufficient documentation

## 2016-09-17 DIAGNOSIS — I071 Rheumatic tricuspid insufficiency: Secondary | ICD-10-CM | POA: Diagnosis not present

## 2016-09-17 DIAGNOSIS — I340 Nonrheumatic mitral (valve) insufficiency: Secondary | ICD-10-CM | POA: Diagnosis not present

## 2016-09-17 NOTE — Progress Notes (Signed)
Echo results: Good news: Essentially stable echocardiogram and normal pump function. Mild to moderate aortic stenosis with moderate regurgitation noted. Mild mitral regurgitation noted. .  No signs to suggest heart attack.. EF: 55-60%. No regional wall motion abnormalities  Glenetta Hew, MD

## 2016-09-18 ENCOUNTER — Ambulatory Visit (HOSPITAL_COMMUNITY)
Admission: RE | Admit: 2016-09-18 | Discharge: 2016-09-18 | Disposition: A | Discharge status: Home / Self Care | Payer: Medicare Other | Source: Ambulatory Visit | Attending: Cardiovascular Disease | Admitting: Cardiovascular Disease

## 2016-09-18 DIAGNOSIS — I10 Essential (primary) hypertension: Secondary | ICD-10-CM | POA: Diagnosis not present

## 2016-09-18 DIAGNOSIS — I779 Disorder of arteries and arterioles, unspecified: Secondary | ICD-10-CM

## 2016-09-18 DIAGNOSIS — I6523 Occlusion and stenosis of bilateral carotid arteries: Secondary | ICD-10-CM | POA: Diagnosis not present

## 2016-09-18 DIAGNOSIS — I739 Peripheral vascular disease, unspecified: Secondary | ICD-10-CM

## 2016-09-18 DIAGNOSIS — Z720 Tobacco use: Secondary | ICD-10-CM | POA: Diagnosis not present

## 2016-09-18 DIAGNOSIS — R0989 Other specified symptoms and signs involving the circulatory and respiratory systems: Secondary | ICD-10-CM | POA: Diagnosis present

## 2016-09-25 ENCOUNTER — Other Ambulatory Visit: Payer: Self-pay | Admitting: *Deleted

## 2016-09-25 DIAGNOSIS — I2510 Atherosclerotic heart disease of native coronary artery without angina pectoris: Secondary | ICD-10-CM

## 2016-09-25 DIAGNOSIS — I2119 ST elevation (STEMI) myocardial infarction involving other coronary artery of inferior wall: Secondary | ICD-10-CM

## 2016-09-25 DIAGNOSIS — I739 Peripheral vascular disease, unspecified: Principal | ICD-10-CM

## 2016-09-25 DIAGNOSIS — I779 Disorder of arteries and arterioles, unspecified: Secondary | ICD-10-CM

## 2016-09-25 DIAGNOSIS — Z9861 Coronary angioplasty status: Secondary | ICD-10-CM

## 2016-09-25 NOTE — Progress Notes (Unsigned)
[place] CAROTID DOPPLER - BILATERAL DUE 2018

## 2016-09-28 DIAGNOSIS — Z23 Encounter for immunization: Secondary | ICD-10-CM | POA: Diagnosis not present

## 2016-11-12 ENCOUNTER — Other Ambulatory Visit: Payer: Self-pay | Admitting: Cardiology

## 2016-11-19 DIAGNOSIS — H401132 Primary open-angle glaucoma, bilateral, moderate stage: Secondary | ICD-10-CM | POA: Diagnosis not present

## 2016-12-14 ENCOUNTER — Other Ambulatory Visit: Payer: Self-pay | Admitting: Cardiology

## 2016-12-17 ENCOUNTER — Other Ambulatory Visit: Payer: Self-pay

## 2016-12-17 MED ORDER — CLOPIDOGREL BISULFATE 75 MG PO TABS: 75.0000 mg | ORAL_TABLET | Freq: Every day | ORAL | 0 refills | Status: DC

## 2016-12-17 NOTE — Telephone Encounter (Signed)
REFILL 

## 2016-12-24 ENCOUNTER — Telehealth: Payer: Self-pay | Admitting: *Deleted

## 2016-12-24 NOTE — Telephone Encounter (Signed)
Spoke to patient - disability parking placard form ready to be picked up-to be mailed -patient verbalized understanding and will pick up tomorrow.

## 2017-02-07 DIAGNOSIS — T1490XA Injury, unspecified, initial encounter: Secondary | ICD-10-CM | POA: Diagnosis not present

## 2017-02-13 ENCOUNTER — Encounter: Payer: Self-pay | Admitting: Family Medicine

## 2017-02-13 ENCOUNTER — Other Ambulatory Visit: Payer: Self-pay | Admitting: Family Medicine

## 2017-02-13 ENCOUNTER — Telehealth: Payer: Self-pay | Admitting: Internal Medicine

## 2017-02-13 ENCOUNTER — Ambulatory Visit (INDEPENDENT_AMBULATORY_CARE_PROVIDER_SITE_OTHER): Payer: Medicare Other | Admitting: Family Medicine

## 2017-02-13 VITALS — BP 126/68 | HR 69 | Temp 98.3°F | Ht 66.0 in | Wt 127.7 lb

## 2017-02-13 DIAGNOSIS — S51811A Laceration without foreign body of right forearm, initial encounter: Secondary | ICD-10-CM | POA: Diagnosis not present

## 2017-02-13 DIAGNOSIS — R300 Dysuria: Secondary | ICD-10-CM | POA: Diagnosis not present

## 2017-02-13 DIAGNOSIS — I10 Essential (primary) hypertension: Secondary | ICD-10-CM

## 2017-02-13 LAB — URINALYSIS, MICROSCOPIC ONLY

## 2017-02-13 LAB — POCT URINALYSIS DIPSTICK
Bilirubin, UA: NEGATIVE
Glucose, UA: NEGATIVE
Ketones, UA: NEGATIVE
NITRITE UA: NEGATIVE
PH UA: 5
Spec Grav, UA: 1.025
UROBILINOGEN UA: 0.2

## 2017-02-13 MED ORDER — NITROFURANTOIN MONOHYD MACRO 100 MG PO CAPS: 100.0000 mg | ORAL_CAPSULE | Freq: Two times a day (BID) | ORAL | 0 refills | Status: DC

## 2017-02-13 NOTE — Telephone Encounter (Signed)
Please schedule est. Care for patient. Thank you!

## 2017-02-13 NOTE — Patient Instructions (Addendum)
BEFORE YOU LEAVE: -urine dip -follow up:  New Patient Visit with Dr. Martinique, [name] or [place] in next 1 month - he prefers this location, used to see Dr. Joni Fears  Use caution with walking. Wear good shoes. Ensure good balance before walking.  Follow up sooner if new or recurring symptoms.  Follow up with your cardiologist as planned and sooner if needed.

## 2017-02-13 NOTE — Progress Notes (Signed)
HPI:  Nicholas Porter is a pleasant 83 yo here with is wife for an acute visit regarding his blood pressure, a recent fall and to check a laceration on his arm. Reports a mechanical fall 2 days ago when had on slippery soft slipper on hardwood and the slipper started to slide off his foot. He suffered a laceration to the R forearm and had significant bleeding so spouse called EMS. Denies head injury, other injury, LOC or precipitating dizziness/balance issues/CP/etc. Wife reports EMS cleaned an bandaged wound and advised PCP follow up to check this and to recheck BP as it was high at the time. Denies CP, SOB, HA, any other injury today or balance issues. He reports his cardiologist watches his BP and he reports he has a follow up next month. Wife wants him to check a urine today because he had a chill once yesterday and had some urinary hesitancy. Pt reports this is not a chronic or regular occurrence for him, but he had one similar episode last year and his daughter is concerned he may have a UTI. Denies fevers, any symptoms since, hematuria. He wants to establish care with a provider here. Used to see Dr. Joni Fears. Was made to go to [facility] when Dr. Joni Fears left and wife reports this is a much more convenient location for them.    ROS: See pertinent positives and negatives per HPI.  Past Medical History:  Diagnosis Date  . Anemia of chronic renal failure, stage 4 (severe) ([place]) 11/27/2015  . 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 CAD S/P percutaneous coronary angioplasty - DES in RCA with PTCA for ISR; Moderate mLAD, 80% ostial OM2 stable 05/22/2008   Qualifier: Diagnosis of  By: Linda Hedges MD, Heinz Knuckles   . CKD (chronic kidney disease) stage 3, GFR 30-59 ml/min   . Essential hypertension 06/30/2007   Qualifier: Diagnosis of  By: Cori Razor RN, Mikal Plane    . H/O Inferior STEMI (09/2013) emergent PCI with Promus DES to RCA  (3.0 mm  x 28 mm - 3.2 mm) 10/05/2013  . Left-sided carotid artery disease -- s/p CEA 04/06/2006   S/p Left CEA   . Mitral regurgitation - onexam 09/03/2016    Past Surgical History:  Procedure Laterality Date  . AMPUTATION     left index finger -tramatic  . CARDIAC CATHETERIZATION  November 2011   40% mid LAD, 50% proximal RCA, 30-40% distal RCA (DOMINANT RCA with wraparound PDA & 2 large PLBs), 60-70% ostial OM1. No change from 2001. Medical therapy  . CAROTID ENDARTERECTOMY Left 2007   Dr.Hayes  . CARPAL TUNNEL RELEASE    . CORNEAL TRANSPLANT    . CORONARY ANGIOPLASTY WITH STENT PLACEMENT  09/2013; 05/2014    d) Promus DES 3.0 mm x 28 mm (3.2 mm)  to RCA with STEMI; residual 70% mid-distal LAD, OM 270-80%. Medical management; b) PTCA of 75 % ISR , LAD lesion noted as ~40%  . FOOT SURGERY     left  . HERNIA REPAIR    . LEFT HEART CATHETERIZATION WITH CORONARY ANGIOGRAM N/A 10/05/2013   Procedure: LEFT HEART CATHETERIZATION WITH CORONARY ANGIOGRAM;  Surgeon: Peter M Martinique, MD;  Location: [facility] CATH LAB;  Service: Cardiovascular;  RCA 100% - PCI,CxOM stable, LAD ~70% mid;  . LEFT HEART CATHETERIZATION WITH CORONARY ANGIOGRAM N/A 06/08/2014   Procedure: LEFT HEART CATHETERIZATION WITH CORONARY ANGIOGRAM;  Surgeon: Troy Sine, MD;  Location: [facility] CATH LAB;  Service: Cardiovascular;  UA 6/'15: 75% ISR in RCA - PTCA, LAD lesion ~40%, CxOM stable.    Marland Kitchen NM MYOVIEW LTD  11/2013; 08/1014   a) 12/'14: EF 59%, Fixed Inferior-Inferoapical Defect c/w Inferior Scar; No Ischemia; b) 9/'15:  9/'15: EF 55% - persistent fixed inferior-inferoapical perfusion defect  - read as diaphragmatic attenuation, likely consistent with infarct/scar   . PARTIAL GASTRECTOMY    . PERCUTANEOUS CORONARY INTERVENTION-BALLOON ONLY  06/08/2014   Procedure: PERCUTANEOUS CORONARY INTERVENTION-BALLOON ONLY;  Surgeon: Troy Sine, MD;  Location: [facility] CATH LAB;  Service: Cardiovascular;;  . ROTATOR CUFF REPAIR     left  . SHOULDER  ARTHROSCOPY WITH ROTATOR CUFF REPAIR AND SUBACROMIAL DECOMPRESSION Right 07/01/2013   Procedure: RIGHT SHOULDER ARTHROSCOPY WITH  SUBACROMIAL DECOMPRESSION/DISTAL CLAVICLE RESECTION/ROTATOR CUFF REPAIR ;  Surgeon: Marin Shutter, MD;  Location: [facility];  Service: Orthopedics;  Laterality: Right;  . SPINE SURGERY  1985   cervical laminectomy  . TOTAL KNEE ARTHROPLASTY    . VAGOTOMY     partial gastrectomy    Family History  Problem Relation Age of Onset  . Heart disease Sister   . Diabetes Sister     Social History   Social History  . Marital status: Married    Spouse name: N/A  . Number of children: N/A  . Years of education: N/A   Occupational History  .  Retired    Retired   Social History Main Topics  . Smoking status: Former Smoker    Types: Cigarettes    Quit date: 12/23/1964  . Smokeless tobacco: Never Used  . Alcohol use No  . Drug use: No  . Sexual activity: Not Asked   Other Topics Concern  . None   Social History Narrative   He is married with 2 children. He lives with his wife.   Very active at home, prior to a STEMI, he could walk several miles without discomfort. Prior to his MI, he would use his push mower to mow his entire lawn.   He is a former smoker and quit back in 1966. He does not drink alcohol     Current Outpatient Prescriptions:  .  brinzolamide (AZOPT) 1 % ophthalmic suspension, Place 1 drop into the left eye every 12 (twelve) hours. , Disp: , Rfl:  .  clopidogrel (PLAVIX) 75 MG tablet, Take 1 tablet (75 mg total) by mouth daily., Disp: 90 tablet, Rfl: 0 .  losartan (COZAAR) 50 MG tablet, Take 1 tablet (50 mg total) by mouth daily., Disp: 30 tablet, Rfl: 5 .  metoprolol succinate (TOPROL-XL) 25 MG 24 hr tablet, Take 1 tablet (25 mg total) by mouth daily., Disp: 90 tablet, Rfl: 2 .  nitroGLYCERIN (NITROSTAT) 0.4 MG SL tablet, Place 1 tablet (0.4 mg total) under the tongue every 5 (five) minutes as needed. For chest pain., Disp: 25 tablet, Rfl:  6 .  TRAVATAN Z 0.004 % SOLN ophthalmic solution, Place 1 drop into both eyes at bedtime. , Disp: , Rfl:   EXAM:  Vitals:   02/13/17 0948  BP: 126/68  Pulse: 69  Temp: 98.3 F (36.8 C)    Body mass index is 20.61 kg/m.  GENERAL: vitals reviewed and listed above, alert, oriented, appears well hydrated and in no acute distress  HEENT: atraumatic, conjunttiva clear, no obvious abnormalities on inspection of external nose and ears  NECK: no obvious masses on inspection  LUNGS: clear to auscultation bilaterally, no wheezes, rales or  rhonchi, good air movement  CV: HRRR, no peripheral edema  MS: moves all extremities without noticeable abnormality  PSYCH: pleasant and cooperative, no obvious depression or anxiety  ASSESSMENT AND PLAN:  Discussed the following assessment and plan:  Essential hypertension -BP is good today -continue current meds, advised f/u with cardiologist as planned and recheck at [facility]  Skin tear of right forearm without complication, initial encounter -healing well, no signs of infection or persistent bleeding, wound recs provided  Dysuria -udip -advised to seek care if recurrent symptoms  Fall: -sound like this was mechanical with no other injury, head injury or LOC -advised fall precautions, not to wear socks or slippers on hardwood floors, etc  He is behind on routine PCP follow up. Wife reports this is because they don't like location of new PCP office and they want to come back to [place]. She is a bit upset that she felt she was not offered this choice when Dr. Joni Fears retired. Advised her of providers taking new patients here and they plan to set up new pt visit as they leave today.  -Patient advised to return or notify a doctor immediately if symptoms worsen or persist or new concerns arise.  Patient Instructions  BEFORE YOU LEAVE: -urine dip -follow up:  New Patient Visit with Dr. Martinique, [name] or [place] in next 1 month - he prefers this  location, used to see Dr. Joni Fears  Use caution with walking. Wear good shoes. Ensure good balance before walking.  Follow up sooner if new or recurring symptoms.  Follow up with your cardiologist as planned and sooner if needed.    Colin Benton R., DO

## 2017-02-13 NOTE — Telephone Encounter (Signed)
That is fine with me.

## 2017-02-13 NOTE — Telephone Encounter (Signed)
Pt Nicholas Porter and Nicholas Porter want to transfer to [employer] [facility].

## 2017-02-13 NOTE — Telephone Encounter (Signed)
Is this ok?

## 2017-02-13 NOTE — Addendum Note (Signed)
Addended by: Agnes Lawrence on: 02/13/2017 10:26 AM   Modules accepted: Orders

## 2017-02-13 NOTE — Progress Notes (Signed)
Pre visit review using our clinic review tool, if applicable. No additional management support is needed unless otherwise documented below in the visit note. 

## 2017-02-13 NOTE — Telephone Encounter (Signed)
Ok with me, thanks.

## 2017-02-13 NOTE — Addendum Note (Signed)
Addended by: Lahoma Crocker A on: 02/13/2017 10:30 AM   Modules accepted: Orders

## 2017-02-15 LAB — URINE CULTURE

## 2017-02-21 NOTE — Telephone Encounter (Signed)
Pt and wife has been scheduled.

## 2017-02-23 ENCOUNTER — Inpatient Hospital Stay (HOSPITAL_COMMUNITY)
Admission: EM | Admit: 2017-02-23 | Discharge: 2017-02-28 | DRG: 690 | Disposition: A | Discharge status: Home Health | Payer: Medicare Other | Attending: Internal Medicine | Admitting: Internal Medicine

## 2017-02-23 ENCOUNTER — Encounter (HOSPITAL_COMMUNITY): Payer: Self-pay | Admitting: *Deleted

## 2017-02-23 ENCOUNTER — Emergency Department (HOSPITAL_COMMUNITY): Payer: Medicare Other

## 2017-02-23 DIAGNOSIS — Z885 Allergy status to narcotic agent status: Secondary | ICD-10-CM | POA: Diagnosis not present

## 2017-02-23 DIAGNOSIS — I340 Nonrheumatic mitral (valve) insufficiency: Secondary | ICD-10-CM | POA: Diagnosis present

## 2017-02-23 DIAGNOSIS — I2510 Atherosclerotic heart disease of native coronary artery without angina pectoris: Secondary | ICD-10-CM | POA: Diagnosis present

## 2017-02-23 DIAGNOSIS — I10 Essential (primary) hypertension: Secondary | ICD-10-CM | POA: Diagnosis not present

## 2017-02-23 DIAGNOSIS — E872 Acidosis, unspecified: Secondary | ICD-10-CM | POA: Diagnosis present

## 2017-02-23 DIAGNOSIS — N3001 Acute cystitis with hematuria: Secondary | ICD-10-CM | POA: Diagnosis present

## 2017-02-23 DIAGNOSIS — N179 Acute kidney failure, unspecified: Secondary | ICD-10-CM | POA: Diagnosis not present

## 2017-02-23 DIAGNOSIS — Z903 Acquired absence of stomach [part of]: Secondary | ICD-10-CM

## 2017-02-23 DIAGNOSIS — Z955 Presence of coronary angioplasty implant and graft: Secondary | ICD-10-CM

## 2017-02-23 DIAGNOSIS — Z79899 Other long term (current) drug therapy: Secondary | ICD-10-CM | POA: Diagnosis not present

## 2017-02-23 DIAGNOSIS — B961 Klebsiella pneumoniae [K. pneumoniae] as the cause of diseases classified elsewhere: Secondary | ICD-10-CM | POA: Diagnosis present

## 2017-02-23 DIAGNOSIS — Z833 Family history of diabetes mellitus: Secondary | ICD-10-CM | POA: Diagnosis not present

## 2017-02-23 DIAGNOSIS — N184 Chronic kidney disease, stage 4 (severe): Secondary | ICD-10-CM | POA: Diagnosis not present

## 2017-02-23 DIAGNOSIS — N390 Urinary tract infection, site not specified: Secondary | ICD-10-CM | POA: Diagnosis present

## 2017-02-23 DIAGNOSIS — Z7901 Long term (current) use of anticoagulants: Secondary | ICD-10-CM | POA: Diagnosis not present

## 2017-02-23 DIAGNOSIS — D631 Anemia in chronic kidney disease: Secondary | ICD-10-CM | POA: Diagnosis present

## 2017-02-23 DIAGNOSIS — Z9861 Coronary angioplasty status: Secondary | ICD-10-CM | POA: Diagnosis not present

## 2017-02-23 DIAGNOSIS — R05 Cough: Secondary | ICD-10-CM | POA: Diagnosis not present

## 2017-02-23 DIAGNOSIS — R531 Weakness: Secondary | ICD-10-CM | POA: Diagnosis not present

## 2017-02-23 DIAGNOSIS — Z7902 Long term (current) use of antithrombotics/antiplatelets: Secondary | ICD-10-CM

## 2017-02-23 DIAGNOSIS — R309 Painful micturition, unspecified: Secondary | ICD-10-CM | POA: Diagnosis not present

## 2017-02-23 DIAGNOSIS — Z96659 Presence of unspecified artificial knee joint: Secondary | ICD-10-CM | POA: Diagnosis present

## 2017-02-23 DIAGNOSIS — I129 Hypertensive chronic kidney disease with stage 1 through stage 4 chronic kidney disease, or unspecified chronic kidney disease: Secondary | ICD-10-CM | POA: Diagnosis present

## 2017-02-23 DIAGNOSIS — Z87891 Personal history of nicotine dependence: Secondary | ICD-10-CM | POA: Diagnosis not present

## 2017-02-23 DIAGNOSIS — R030 Elevated blood-pressure reading, without diagnosis of hypertension: Secondary | ICD-10-CM | POA: Diagnosis not present

## 2017-02-23 DIAGNOSIS — R339 Retention of urine, unspecified: Secondary | ICD-10-CM | POA: Diagnosis present

## 2017-02-23 DIAGNOSIS — E440 Moderate protein-calorie malnutrition: Secondary | ICD-10-CM | POA: Diagnosis present

## 2017-02-23 DIAGNOSIS — Z8249 Family history of ischemic heart disease and other diseases of the circulatory system: Secondary | ICD-10-CM

## 2017-02-23 DIAGNOSIS — Z947 Corneal transplant status: Secondary | ICD-10-CM

## 2017-02-23 DIAGNOSIS — I252 Old myocardial infarction: Secondary | ICD-10-CM

## 2017-02-23 DIAGNOSIS — Z89022 Acquired absence of left finger(s): Secondary | ICD-10-CM | POA: Diagnosis not present

## 2017-02-23 DIAGNOSIS — N3000 Acute cystitis without hematuria: Secondary | ICD-10-CM | POA: Diagnosis not present

## 2017-02-23 DIAGNOSIS — R0602 Shortness of breath: Secondary | ICD-10-CM | POA: Diagnosis not present

## 2017-02-23 DIAGNOSIS — Z888 Allergy status to other drugs, medicaments and biological substances status: Secondary | ICD-10-CM

## 2017-02-23 LAB — COMPREHENSIVE METABOLIC PANEL
ALBUMIN: 3.7 g/dL (ref 3.5–5.0)
ALT: 18 U/L (ref 17–63)
AST: 17 U/L (ref 15–41)
Alkaline Phosphatase: 64 U/L (ref 38–126)
Anion gap: 8 (ref 5–15)
BUN: 48 mg/dL — AB (ref 6–20)
CO2: 18 mmol/L — ABNORMAL LOW (ref 22–32)
Calcium: 9.2 mg/dL (ref 8.9–10.3)
Chloride: 113 mmol/L — ABNORMAL HIGH (ref 101–111)
Creatinine, Ser: 2.23 mg/dL — ABNORMAL HIGH (ref 0.61–1.24)
GFR calc Af Amer: 29 mL/min — ABNORMAL LOW (ref >60)
GFR, EST NON AFRICAN AMERICAN: 25 mL/min — AB (ref >60)
Glucose, Bld: 130 mg/dL — ABNORMAL HIGH (ref 65–99)
POTASSIUM: 5.4 mmol/L — AB (ref 3.5–5.1)
SODIUM: 139 mmol/L (ref 135–145)
Total Bilirubin: 0.6 mg/dL (ref 0.3–1.2)
Total Protein: 7.4 g/dL (ref 6.5–8.1)

## 2017-02-23 LAB — URINALYSIS, ROUTINE W REFLEX MICROSCOPIC
BILIRUBIN URINE: NEGATIVE
Glucose, UA: NEGATIVE mg/dL
KETONES UR: NEGATIVE mg/dL
NITRITE: POSITIVE — AB
Protein, ur: 30 mg/dL — AB
Specific Gravity, Urine: 1.017 (ref 1.005–1.030)
Squamous Epithelial / LPF: NONE SEEN
pH: 5 (ref 5.0–8.0)

## 2017-02-23 LAB — CBC WITH DIFFERENTIAL/PLATELET
BASOS PCT: 0 %
Basophils Absolute: 0 10*3/uL (ref 0.0–0.1)
EOS PCT: 0 %
Eosinophils Absolute: 0 10*3/uL (ref 0.0–0.7)
HEMATOCRIT: 30.7 % — AB (ref 39.0–52.0)
Hemoglobin: 9.7 g/dL — ABNORMAL LOW (ref 13.0–17.0)
LYMPHS ABS: 0.9 10*3/uL (ref 0.7–4.0)
Lymphocytes Relative: 6 %
MCH: 27.2 pg (ref 26.0–34.0)
MCHC: 31.6 g/dL (ref 30.0–36.0)
MCV: 86 fL (ref 78.0–100.0)
MONOS PCT: 9 %
Monocytes Absolute: 1.4 10*3/uL — ABNORMAL HIGH (ref 0.1–1.0)
Neutro Abs: 13.2 10*3/uL — ABNORMAL HIGH (ref 1.7–7.7)
Neutrophils Relative %: 85 %
Platelets: 168 10*3/uL (ref 150–400)
RBC: 3.57 MIL/uL — AB (ref 4.22–5.81)
RDW: 17.2 % — AB (ref 11.5–15.5)
WBC: 15.5 10*3/uL — AB (ref 4.0–10.5)

## 2017-02-23 LAB — INFLUENZA PANEL BY PCR (TYPE A & B)
INFLAPCR: NEGATIVE
INFLBPCR: NEGATIVE

## 2017-02-23 LAB — I-STAT CG4 LACTIC ACID, ED: Lactic Acid, Venous: 1.13 mmol/L (ref 0.5–1.9)

## 2017-02-23 MED ORDER — DEXTROSE 5 % IV SOLN
1.0000 g | Freq: Every day | INTRAVENOUS | Status: DC
  Administered 2017-02-23 – 2017-02-26 (×4): 1 g via INTRAVENOUS
  Filled 2017-02-23 (×5): qty 10

## 2017-02-23 MED ORDER — ACETAMINOPHEN 325 MG PO TABS
650.0000 mg | ORAL_TABLET | Freq: Once | ORAL | Status: AC
  Administered 2017-02-23: 650 mg via ORAL
  Filled 2017-02-23: qty 2

## 2017-02-23 MED ORDER — ACETAMINOPHEN 325 MG PO TABS
650.0000 mg | ORAL_TABLET | Freq: Four times a day (QID) | ORAL | Status: DC | PRN
  Administered 2017-02-25 – 2017-02-26 (×2): 650 mg via ORAL
  Filled 2017-02-23 (×3): qty 2

## 2017-02-23 MED ORDER — ONDANSETRON HCL 4 MG PO TABS: 4.0000 mg | ORAL_TABLET | Freq: Four times a day (QID) | ORAL | Status: DC | PRN

## 2017-02-23 MED ORDER — ACETAMINOPHEN 650 MG RE SUPP: 650.0000 mg | Freq: Four times a day (QID) | RECTAL | Status: DC | PRN

## 2017-02-23 MED ORDER — DEXTROSE 5 % IV SOLN: 1.0000 g | INTRAVENOUS | Status: DC

## 2017-02-23 MED ORDER — SODIUM CHLORIDE 0.9 % IV BOLUS (SEPSIS)
1000.0000 mL | Freq: Once | INTRAVENOUS | Status: AC
  Administered 2017-02-23: 1000 mL via INTRAVENOUS

## 2017-02-23 MED ORDER — ONDANSETRON HCL 4 MG/2ML IJ SOLN: 4.0000 mg | Freq: Four times a day (QID) | INTRAMUSCULAR | Status: DC | PRN

## 2017-02-23 MED ORDER — LATANOPROST 0.005 % OP SOLN
1.0000 [drp] | Freq: Every day | OPHTHALMIC | Status: DC
  Administered 2017-02-24 – 2017-02-27 (×5): 1 [drp] via OPHTHALMIC
  Filled 2017-02-23: qty 2.5

## 2017-02-23 MED ORDER — SODIUM CHLORIDE 0.9 % IV SOLN
INTRAVENOUS | Status: DC
  Administered 2017-02-24 (×2): via INTRAVENOUS

## 2017-02-23 NOTE — ED Provider Notes (Signed)
[place] DEPT Provider Note   CSN: [id number] Arrival date & time: 02/23/17  [place]     History   Chief Complaint Chief Complaint  Patient presents with  . Urinary Retention    HPI Nicholas Porter is a 83 y.o. male.  HPI 83 year old Caucasian male with a past medical history significant for CKD stage III, CAD status post PCI 2009, hypertension that presents to the ED today with complaints of urinary retention and increased weakness. Patient states that approximately 3 weeks ago he had a mechanical fall onto his sacral region. Sustained minor abrasions to his forearms. Since then he was having urinary complaints of urgency, frequency, retention. Was seen by his PCP 2/22 who prescribed him a course of Macrobid. States that he was put onto a course of Macrobid. States that he finished his medication on 3/1. Has continued to have urinary symptoms of retention, hesitancy. States that he is not make much urine. Patient also states that he has been feeling more weak over the past 2-3 days. Says that it is difficult for him to get up from being too weak. Patient is ambulatory at home. EMS was called out tonight to bring patient to the ED for evaluation due to his weakness and urinary retention. Wife states he had a fever on and off for the past 2-3 days. She has given him Tylenol for his fever. States he has had poor by mouth intake. Denies any headaches, vision changes, lightheadedness, dizziness, cough, rhinorrhea, shortness of breath, chest pain, abdominal pain, change in bowel habits. Past Medical History:  Diagnosis Date  . Anemia of chronic renal failure, stage 4 (severe) ([place]) 11/27/2015  . 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 CAD S/P percutaneous coronary angioplasty - DES in RCA with PTCA for ISR; Moderate mLAD, 80% ostial OM2 stable 05/22/2008   Qualifier: Diagnosis of  By: Linda Hedges MD, Heinz Knuckles   .  CKD (chronic kidney disease) stage 3, GFR 30-59 ml/min   . Essential hypertension 06/30/2007   Qualifier: Diagnosis of  By: Cori Razor RN, Mikal Plane    . H/O Inferior STEMI (09/2013) emergent PCI with Promus DES to RCA (3.0 mm  x 28 mm - 3.2 mm) 10/05/2013  . Left-sided carotid artery disease -- s/p CEA 04/06/2006   S/p Left CEA   . Mitral regurgitation - onexam 09/03/2016    Patient Active Problem List   Diagnosis Date Noted  . Dyspnea on exertion 09/05/2016  . Mitral regurgitation - on exam 09/03/2016  . Diarrhea of presumed infectious origin   . Malnutrition of moderate degree 11/29/2015  . Metabolic acidosis 26/33/3545  . Nausea vomiting and diarrhea 11/27/2015  . Acute renal failure superimposed on stage 4 chronic kidney disease ([place]) 11/27/2015  . Anemia of chronic renal failure, stage 4 (severe) ([place]) 11/27/2015  . Hyperkalemia 11/27/2015  . Dehydration 11/27/2015  . Protein-calorie malnutrition, severe ([place]) 11/27/2015  . Leukocytosis 11/27/2015  . GERD (gastroesophageal reflux disease) 01/24/2015  . Thrombocytopenia ([place]) 08/09/2014  . H/O Inferior STEMI (09/2013) emergent PCI with Promus DES to RCA (3.0 mm  x 28 mm - 3.2 mm) 10/05/2013  . 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 CAD S/P percutaneous coronary angioplasty - DES in RCA with PTCA for ISR; Moderate mLAD, 80% ostial OM2 stable 05/22/2008  . Essential hypertension 06/30/2007  . Left-sided carotid artery disease -- s/p CEA 04/06/2006    Class: History of  Past Surgical History:  Procedure Laterality Date  . AMPUTATION     left index finger -tramatic  . CARDIAC CATHETERIZATION  November 2011   40% mid LAD, 50% proximal RCA, 30-40% distal RCA (DOMINANT RCA with wraparound PDA & 2 large PLBs), 60-70% ostial OM1. No change from 2001. Medical therapy  . CAROTID ENDARTERECTOMY Left 2007   Dr.Hayes  . CARPAL TUNNEL RELEASE    . CORNEAL TRANSPLANT    . CORONARY ANGIOPLASTY WITH STENT PLACEMENT  09/2013; 05/2014    d) Promus DES  3.0 mm x 28 mm (3.2 mm)  to RCA with STEMI; residual 70% mid-distal LAD, OM 270-80%. Medical management; b) PTCA of 75 % ISR , LAD lesion noted as ~40%  . FOOT SURGERY     left  . HERNIA REPAIR    . LEFT HEART CATHETERIZATION WITH CORONARY ANGIOGRAM N/A 10/05/2013   Procedure: LEFT HEART CATHETERIZATION WITH CORONARY ANGIOGRAM;  Surgeon: Peter M Martinique, MD;  Location: [facility] CATH LAB;  Service: Cardiovascular;  RCA 100% - PCI,CxOM stable, LAD ~70% mid;  . LEFT HEART CATHETERIZATION WITH CORONARY ANGIOGRAM N/A 06/08/2014   Procedure: LEFT HEART CATHETERIZATION WITH CORONARY ANGIOGRAM;  Surgeon: Troy Sine, MD;  Location: [facility] CATH LAB;  Service: Cardiovascular;  UA 6/'15: 75% ISR in RCA - PTCA, LAD lesion ~40%, CxOM stable.    Marland Kitchen NM MYOVIEW LTD  11/2013; 08/1014   a) 12/'14: EF 59%, Fixed Inferior-Inferoapical Defect c/w Inferior Scar; No Ischemia; b) 9/'15:  9/'15: EF 55% - persistent fixed inferior-inferoapical perfusion defect  - read as diaphragmatic attenuation, likely consistent with infarct/scar   . PARTIAL GASTRECTOMY    . PERCUTANEOUS CORONARY INTERVENTION-BALLOON ONLY  06/08/2014   Procedure: PERCUTANEOUS CORONARY INTERVENTION-BALLOON ONLY;  Surgeon: Troy Sine, MD;  Location: [facility] CATH LAB;  Service: Cardiovascular;;  . ROTATOR CUFF REPAIR     left  . SHOULDER ARTHROSCOPY WITH ROTATOR CUFF REPAIR AND SUBACROMIAL DECOMPRESSION Right 07/01/2013   Procedure: RIGHT SHOULDER ARTHROSCOPY WITH  SUBACROMIAL DECOMPRESSION/DISTAL CLAVICLE RESECTION/ROTATOR CUFF REPAIR ;  Surgeon: Marin Shutter, MD;  Location: [place];  Service: Orthopedics;  Laterality: Right;  . SPINE SURGERY  1985   cervical laminectomy  . TOTAL KNEE ARTHROPLASTY    . VAGOTOMY     partial gastrectomy       Home Medications    Prior to Admission medications   Medication Sig Start Date End Date Taking? Authorizing Provider  brinzolamide (AZOPT) 1 % ophthalmic suspension Place 1 drop into the left eye every 12 (twelve) hours.      Historical Provider, MD  clopidogrel (PLAVIX) 75 MG tablet Take 1 tablet (75 mg total) by mouth daily. 12/17/16   Leonie Man, MD  losartan (COZAAR) 50 MG tablet Take 1 tablet (50 mg total) by mouth daily. 08/30/16   Leonie Man, MD  metoprolol succinate (TOPROL-XL) 25 MG 24 hr tablet Take 1 tablet (25 mg total) by mouth daily. 05/24/16   Leonie Man, MD  nitrofurantoin, macrocrystal-monohydrate, (MACROBID) 100 MG capsule Take 1 capsule (100 mg total) by mouth 2 (two) times daily. 02/13/17   Lucretia [name], DO  nitroGLYCERIN (NITROSTAT) 0.4 MG SL tablet Place 1 tablet (0.4 mg total) under the tongue every 5 (five) minutes as needed. For chest pain. 01/30/16   Leonie Man, MD  TRAVATAN Z 0.004 % SOLN ophthalmic solution Place 1 drop into both eyes at bedtime.  04/20/13   Historical Provider, MD    Family History Family History  Problem Relation Age of Onset  . Heart disease Sister   . Diabetes Sister     Social History Social History  Substance Use Topics  . Smoking status: Former Smoker    Types: Cigarettes    Quit date: 12/23/1964  . Smokeless tobacco: Never Used  . Alcohol use No     Allergies   Amlodipine besylate; Hydralazine; Lipitor [atorvastatin]; Naproxen; and Oxycodone-acetaminophen   Review of Systems Review of Systems  Constitutional: Positive for chills and fever.  HENT: Negative for congestion.   Eyes: Negative for visual disturbance.  Respiratory: Negative for cough and shortness of breath.   Cardiovascular: Negative for chest pain, palpitations and leg swelling.  Gastrointestinal: Negative for abdominal pain, diarrhea, nausea and vomiting.  Genitourinary: Positive for decreased urine volume, dysuria, frequency and urgency. Negative for hematuria.  Musculoskeletal: Positive for back pain.  Skin: Positive for wound.  Neurological: Positive for weakness. Negative for dizziness, syncope, light-headedness, numbness and headaches.  All other systems  reviewed and are negative.    Physical Exam Updated Vital Signs BP 176/73   Pulse (!) 59   Temp 100.9 F (38.3 C) (Oral)   Resp 18   SpO2 97%   Physical Exam  Constitutional: He is oriented to person, place, and time. He appears well-developed and well-nourished. No distress.  Frail elderly man that is nontoxic-appearing. Sitting in bed in no acute distress.  HENT:  Head: Normocephalic and atraumatic.  Right Ear: Tympanic membrane, external ear and ear canal normal. No hemotympanum.  Left Ear: Tympanic membrane, external ear and ear canal normal. No hemotympanum.  Nose: Nose normal. No nasal septal hematoma.  Mouth/Throat: Uvula is midline, oropharynx is clear and moist and mucous membranes are normal.  No hematoma or wounds.  Eyes: Conjunctivae and EOM are normal. Pupils are equal, round, and reactive to light.  Neck: Normal range of motion. Neck supple.  No midline C-spine tenderness.  Cardiovascular: Normal rate, regular rhythm, normal heart sounds and intact distal pulses.  Exam reveals no friction rub.   No murmur heard. Pulmonary/Chest: Effort normal and breath sounds normal. No respiratory distress. He has no wheezes. He has no rales.  Abdominal: Soft. Bowel sounds are normal. He exhibits no distension. There is no tenderness. There is no rebound and no guarding.  Genitourinary:  Genitourinary Comments: Chaperone present for exam. Normal rectal tone.  Musculoskeletal: Normal range of motion.  Lymphadenopathy:    He has no cervical adenopathy.  Neurological: He is alert and oriented to person, place, and time.  The patient is alert, attentive, and oriented x 3. Speech is clear. Cranial nerve II-VII grossly intact. Negative pronator drift. Sensation intact. Strength 5/5 in all extremities. Reflexes 2+ and symmetric at biceps, triceps, knees, and ankles. Rapid alternating movement and fine finger movements intact.   Skin: Skin is warm and dry. Capillary refill takes less  than 2 seconds.  Healed scab wounds to the bilateral forearms there are no signs of infection. No purulent drainage. No erythema.  Nursing note and vitals reviewed.    ED Treatments / Results  Labs (all labs ordered are listed, but only abnormal results are displayed) Labs Reviewed  COMPREHENSIVE METABOLIC PANEL - Abnormal; Notable for the following:       Result Value   Potassium 5.4 (*)    Chloride 113 (*)    CO2 18 (*)    Glucose, Bld 130 (*)    BUN 48 (*)    Creatinine, Ser 2.23 (*)  GFR calc non Af Amer 25 (*)    GFR calc Af Amer 29 (*)    All other components within normal limits  CBC WITH DIFFERENTIAL/PLATELET - Abnormal; Notable for the following:    WBC 15.5 (*)    RBC 3.57 (*)    Hemoglobin 9.7 (*)    HCT 30.7 (*)    RDW 17.2 (*)    Neutro Abs 13.2 (*)    Monocytes Absolute 1.4 (*)    All other components within normal limits  URINALYSIS, ROUTINE W REFLEX MICROSCOPIC - Abnormal; Notable for the following:    APPearance CLOUDY (*)    Hgb urine dipstick SMALL (*)    Protein, ur 30 (*)    Nitrite POSITIVE (*)    Leukocytes, UA LARGE (*)    Bacteria, UA FEW (*)    All other components within normal limits  CULTURE, BLOOD (ROUTINE X 2)  CULTURE, BLOOD (ROUTINE X 2)  INFLUENZA PANEL BY PCR (TYPE A & B)  I-STAT CG4 LACTIC ACID, ED    EKG  EKG Interpretation None       Radiology Dg Chest 2 View  Result Date: 02/23/2017 CLINICAL DATA:  Recent cough, congestion and sore throat. Cold sores. Recent diagnosis of UTI. Chronic shortness of breath due to heart attack 3 years ago. EXAM: CHEST  2 VIEW COMPARISON:  Chest x-ray dated 07/29/2015. FINDINGS: Heart size and mediastinal contours are normal. Atherosclerotic changes again noted at the aortic arch. Lungs are hyperexpanded. Suspect associated chronic bronchitic changes centrally. Lungs otherwise clear. No pleural effusion or pneumothorax seen. Degenerative spurring again noted throughout the slightly scoliotic  thoracic spine. No acute or suspicious osseous finding. IMPRESSION: 1. Hyperexpanded lungs suggesting COPD. Suspect associated chronic bronchitic changes centrally. 2. No acute findings.  No evidence of pneumonia or pulmonary edema. 3. Aortic atherosclerosis. Electronically Signed   By: Franki Cabot M.D.   On: 02/23/2017 19:47    Procedures Procedures (including critical care time)  Medications Ordered in ED Medications  sodium chloride 0.9 % bolus 1,000 mL (not administered)  cefTRIAXone (ROCEPHIN) 1 g in dextrose 5 % 50 mL IVPB (not administered)  acetaminophen (TYLENOL) tablet 650 mg (650 mg Oral Given 02/23/17 1900)     Initial Impression / Assessment and Plan / ED Course  I have reviewed the triage vital signs and the nursing notes.  Pertinent labs & imaging results that were available during my care of the patient were reviewed by me and considered in my medical decision making (see chart for details).     The patient resents to the ED with things complaint of urinary retention, urinary urgency, dysuria, weakness. Was treated for a UTI 1 week ago with Macrobid by PCP. Continued symptoms. He is febrile in the ED. Heart rate is normal. He is normotensive. Mild leukocytosis of 15,000. Abdominal exam is benign. No focal neuro deficits. Normal rectal tone. Doubt cauda equina to the patient's recent fall. UA shows signs of infection including nitrites, leukocytes, bacteria. Will start Rocephin. Patient was noted to have a potassium of 5.4. Creatinine is 2.2 baseline at 1.6. Bicarbonate is 18. Stable anemia of 9.7. Acute on chronic kidney disease. Patient given fluids in the ED. EKG without any acute changes. Feel like hyperkalemia intervention is severe at this time. Blood cultures obtained prior to antibiotics throughout bacteremia. Lactic acid was normal. Sepsis protocol was not initiated. Patient is nontoxic appearing. Resting comfortably on the bed. Vital signs are stable. He spoke with Dr.  Shanon Brow  who will admit patient and place orders. Patient was discussed with Dr. Zenia Resides who agrees with the above plan. Patient updated on plan of care.  FinaClinical Impressions(s) / ED Diagnoses   Final diagnoses:  Weakness  AKI (acute kidney injury) ([place])  Acute cystitis with hematuria    New Prescriptions New Prescriptions   No medications on file     Doristine Devoid, Hershal Coria 02/23/17 2131    Lacretia Leigh, MD 02/25/17 [phone]

## 2017-02-23 NOTE — ED Notes (Signed)
ED Provider at bedside. 

## 2017-02-23 NOTE — ED Notes (Signed)
Patient transported to X-ray 

## 2017-02-23 NOTE — ED Notes (Signed)
Pt is unable to urinate but is aware of the urine specimen needed

## 2017-02-23 NOTE — ED Triage Notes (Signed)
Pt fell 3 weeks ago, since has been having difficulty with urinary retention, has to "bare down" to get urine out. Pt is ambulatory, weak, dizzy when he stands up. Pressures 180's/70's en route to ED tonight. A/O. IV established. CBG 137

## 2017-02-23 NOTE — ED Notes (Signed)
Bladder scan 100cc. Last urination around 1700 today

## 2017-02-23 NOTE — ED Notes (Signed)
Attempted report 

## 2017-02-23 NOTE — H&P (Signed)
History and Physical    Jhony Antrim MPN:[id number] DOB: 1930/03/22 DOA: 02/23/2017  PCP: Cathlean Cower, MD  Patient coming from:  home  Chief Complaint:   Urinary problems, weak  HPI: Luisenrique Conran is a 83 y.o. male with medical history significant of CKD, HTN, CAD comes in with over a week of dysuria, urinary retention, urgency , hesitancy and progressive worsening weakness.  Reports chills no fever.  No n/v/d.  Pt went to see his doctor and was placed on macrobid over 2 days ago and this is not helping.  Pt found to have mild AKI and referred for admission for his weakness and uti.   Review of Systems: As per HPI otherwise 10 point review of systems negative.   Past Medical History:  Diagnosis Date  . Anemia of chronic renal failure, stage 4 (severe) ([place]) 11/27/2015  . 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 CAD S/P percutaneous coronary angioplasty - DES in RCA with PTCA for ISR; Moderate mLAD, 80% ostial OM2 stable 05/22/2008   Qualifier: Diagnosis of  By: Linda Hedges MD, Heinz Knuckles   . CKD (chronic kidney disease) stage 3, GFR 30-59 ml/min   . Essential hypertension 06/30/2007   Qualifier: Diagnosis of  By: Cori Razor RN, Mikal Plane    . H/O Inferior STEMI (09/2013) emergent PCI with Promus DES to RCA (3.0 mm  x 28 mm - 3.2 mm) 10/05/2013  . Left-sided carotid artery disease -- s/p CEA 04/06/2006   S/p Left CEA   . Mitral regurgitation - onexam 09/03/2016    Past Surgical History:  Procedure Laterality Date  . AMPUTATION     left index finger -tramatic  . CARDIAC CATHETERIZATION  November 2011   40% mid LAD, 50% proximal RCA, 30-40% distal RCA (DOMINANT RCA with wraparound PDA & 2 large PLBs), 60-70% ostial OM1. No change from 2001. Medical therapy  . CAROTID ENDARTERECTOMY Left 2007   Dr.Hayes  . CARPAL TUNNEL RELEASE    . CORNEAL TRANSPLANT    . CORONARY ANGIOPLASTY WITH STENT PLACEMENT  09/2013;  05/2014    d) Promus DES 3.0 mm x 28 mm (3.2 mm)  to RCA with STEMI; residual 70% mid-distal LAD, OM 270-80%. Medical management; b) PTCA of 75 % ISR , LAD lesion noted as ~40%  . FOOT SURGERY     left  . HERNIA REPAIR    . LEFT HEART CATHETERIZATION WITH CORONARY ANGIOGRAM N/A 10/05/2013   Procedure: LEFT HEART CATHETERIZATION WITH CORONARY ANGIOGRAM;  Surgeon: Peter M Martinique, MD;  Location[facility] C[facility] LAB;  Service: Cardiovascular;  RCA 100% - PCI,CxOM stable, LAD ~70% mid;  . LEFT HEART CATHETERIZATION WITH CORONARY ANGIOGRAM N/A 06/08/2014   Procedure: LEFT HEART CATHETERIZATION WITH CORONARY ANGIOGRAM;  Surgeon: Troy Sine, MD;  Location: [facility] CATH LAB;  Service: Cardiovascular;  UA 6/'15: 75% ISR in RCA - PTCA, LAD lesion ~40%, CxOM stable.    Marland Kitchen NM MYOVIEW LTD  11/2013; 08/1014   a) 12/'14: EF 59%, Fixed Inferior-Inferoapical Defect c/w Inferior Scar; No Ischemia; b) 9/'15:  9/'15: EF 55% - persistent fixed inferior-inferoapical perfusion defect  - read as diaphragmatic attenuation, likely consistent with infarct/scar   . PARTIAL GASTRECTOMY    . PERCUTANEOUS CORONARY INTERVENTION-BALLOON ONLY  06/08/2014   Procedure: PERCUTANEOUS CORONARY INTERVENTION-BALLOON ONLY;  Surgeon: Troy Sine, MD;  Location: [facility] CATH LAB;  Service: Cardiovascular;;  . ROTATOR CUFF REPAIR  left  . SHOULDER ARTHROSCOPY WITH ROTATOR CUFF REPAIR AND SUBACROMIAL DECOMPRESSION Right 07/01/2013   Procedure: RIGHT SHOULDER ARTHROSCOPY WITH  SUBACROMIAL DECOMPRESSION/DISTAL CLAVICLE RESECTION/ROTATOR CUFF REPAIR ;  Surgeon: Marin Shutter, MD;  Location: [place];  Service: Orthopedics;  Laterality: Right;  . SPINE SURGERY  1985   cervical laminectomy  . TOTAL KNEE ARTHROPLASTY    . VAGOTOMY     partial gastrectomy     reports that he quit smoking about 52 years ago. His smoking use included Cigarettes. He has never used smokeless tobacco. He reports that he does not drink alcohol or use drugs.  Allergies  Allergen  Reactions  . Amlodipine Besylate Hives  . Hydralazine Itching  . Lipitor [Atorvastatin] Itching  . Naproxen Diarrhea  . Oxycodone-Acetaminophen Itching    Family History  Problem Relation Age of Onset  . Heart disease Sister   . Diabetes Sister     Prior to Admission medications   Medication Sig Start Date End Date Taking? Authorizing Provider  brinzolamide (AZOPT) 1 % ophthalmic suspension Place 1 drop into the left eye every 12 (twelve) hours.     Historical Provider, MD  clopidogrel (PLAVIX) 75 MG tablet Take 1 tablet (75 mg total) by mouth daily. 12/17/16   Leonie Man, MD  losartan (COZAAR) 50 MG tablet Take 1 tablet (50 mg total) by mouth daily. 08/30/16   Leonie Man, MD  metoprolol succinate (TOPROL-XL) 25 MG 24 hr tablet Take 1 tablet (25 mg total) by mouth daily. 05/24/16   Leonie Man, MD  nitrofurantoin, macrocrystal-monohydrate, (MACROBID) 100 MG capsule Take 1 capsule (100 mg total) by mouth 2 (two) times daily. 02/13/17   Lucretia [name], DO  nitroGLYCERIN (NITROSTAT) 0.4 MG SL tablet Place 1 tablet (0.4 mg total) under the tongue every 5 (five) minutes as needed. For chest pain. 01/30/16   Leonie Man, MD  TRAVATAN Z 0.004 % SOLN ophthalmic solution Place 1 drop into both eyes at bedtime.  04/20/13   Historical Provider, MD    Physical Exam: Vitals:   02/23/17 1841 02/23/17 1924 02/23/17 2029 02/23/17 2126  BP: 178/62 176/73  146/83  Pulse: 73 67 (!) 59 (!) 58  Resp: 20 18 18 23   Temp: 102.4 F (39.1 C)  100.9 F (38.3 C)   TempSrc: Oral  Oral   SpO2: 95% 98% 97% 97%    Constitutional: NAD, calm, comfortable Vitals:   02/23/17 1841 02/23/17 1924 02/23/17 2029 02/23/17 2126  BP: 178/62 176/73  146/83  Pulse: 73 67 (!) 59 (!) 58  Resp: 20 18 18 23   Temp: 102.4 F (39.1 C)  100.9 F (38.3 C)   TempSrc: Oral  Oral   SpO2: 95% 98% 97% 97%   Eyes: PERRL, lids and conjunctivae normal ENMT: Mucous membranes are dry. Posterior pharynx clear of any  exudate or lesions.Normal dentition.  Neck: normal, supple, no masses, no thyromegaly Respiratory: clear to auscultation bilaterally, no wheezing, no crackles. Normal respiratory effort. No accessory muscle use.  Cardiovascular: Regular rate and rhythm, no murmurs / rubs / gallops. No extremity edema. 2+ pedal pulses. No carotid bruits.  Abdomen: no tenderness, no masses palpated. No hepatosplenomegaly. Bowel sounds positive.  Musculoskeletal: no clubbing / cyanosis. No joint deformity upper and lower extremities. Good ROM, no contractures. Normal muscle tone.  Skin: no rashes, lesions, ulcers. No induration Neurologic: CN 2-12 grossly intact. Sensation intact, DTR normal. Strength 5/5 in all 4.  Psychiatric: Normal judgment and insight. Alert  and oriented x 3. Normal mood.    Labs on Admission: I have personally reviewed following labs and imaging studies  CBC:  Recent Labs Lab 02/23/17 1848  WBC 15.5*  NEUTROABS 13.2*  HGB 9.7*  HCT 30.7*  MCV 86.0  PLT 973   Basic Metabolic Panel:  Recent Labs Lab 02/23/17 1848  NA 139  K 5.4*  CL 113*  CO2 18*  GLUCOSE 130*  BUN 48*  CREATININE 2.23*  CALCIUM 9.2   GFR: Estimated Creatinine Clearance: 19.8 mL/min (by C-G formula based on SCr of 2.23 mg/dL (H)). Liver Function Tests:  Recent Labs Lab 02/23/17 1848  AST 17  ALT 18  ALKPHOS 64  BILITOT 0.6  PROT 7.4  ALBUMIN 3.7   Urine analysis:    Component Value Date/Time   COLORURINE YELLOW 02/23/2017 1842   APPEARANCEUR CLOUDY (A) 02/23/2017 1842   LABSPEC 1.017 02/23/2017 1842   PHURINE 5.0 02/23/2017 1842   GLUCOSEU NEGATIVE 02/23/2017 1842   GLUCOSEU NEGATIVE 07/20/2015 1033   HGBUR SMALL (A) 02/23/2017 1842   HGBUR negative 12/06/2009 0836   BILIRUBINUR NEGATIVE 02/23/2017 1842   BILIRUBINUR negative 02/13/2017 1025   KETONESUR NEGATIVE 02/23/2017 1842   PROTEINUR 30 (A) 02/23/2017 1842   UROBILINOGEN 0.2 02/13/2017 1025   UROBILINOGEN 0.2 07/29/2015 1645    NITRITE POSITIVE (A) 02/23/2017 1842   LEUKOCYTESUR LARGE (A) 02/23/2017 1842     Radiological Exams on Admission: Dg Chest 2 View  Result Date: 02/23/2017 CLINICAL DATA:  Recent cough, congestion and sore throat. Cold sores. Recent diagnosis of UTI. Chronic shortness of breath due to heart attack 3 years ago. EXAM: CHEST  2 VIEW COMPARISON:  Chest x-ray dated 07/29/2015. FINDINGS: Heart size and mediastinal contours are normal. Atherosclerotic changes again noted at the aortic arch. Lungs are hyperexpanded. Suspect associated chronic bronchitic changes centrally. Lungs otherwise clear. No pleural effusion or pneumothorax seen. Degenerative spurring again noted throughout the slightly scoliotic thoracic spine. No acute or suspicious osseous finding. IMPRESSION: 1. Hyperexpanded lungs suggesting COPD. Suspect associated chronic bronchitic changes centrally. 2. No acute findings.  No evidence of pneumonia or pulmonary edema. 3. Aortic atherosclerosis. Electronically Signed   By: Franki Cabot M.D.   On: 02/23/2017 19:47     Assessment/Plan 83 yo male with generalized weakness, AKI and UTI  Principal Problem:   UTI (urinary tract infection)- place on rocephin.  Obtain urine culture.  Active Problems:   Acute renal failure superimposed on stage 4 chronic kidney disease ([place])- cr up to 2.2 from baseline of around 1.6.  Treat infection, give ivf.   Essential hypertension- stable, holding ARB   CAD S/P percutaneous coronary angioplasty - DES in RCA with PTCA for ISR; Moderate mLAD, 80% ostial OM2 stable- stable   Anemia of chronic renal failure, stage 4 (severe) (HCC)- stable   Metabolic acidosis- due to mild AKI, ivf and repeat bmp in the am    DVT prophylaxis: scds  Code Status:  full Family Communication:  none  Disposition Plan:  Per day team Consults called:  none Admission status:  admission   Kenyonna Micek A MD [facility]  If 7PM-7AM, please contact  night-coverage [email or web address] Password [email or web address]  02/23/2017, 9:44 PM

## 2017-02-23 NOTE — ED Notes (Signed)
Attempted report, RN to call back

## 2017-02-23 NOTE — Progress Notes (Signed)
PHARMACY NOTE -  ANTIBIOTIC RENAL DOSE ADJUSTMENT   Request received for Pharmacy to assist with antibiotic renal dose adjustment.  Patient has been initiated on ceftriaxone 1gm iv q24hr for UTI. SCr 2.23 , estimated CrCl 19 ml/min Current dosage is appropriate and need for further dosage adjustment appears unlikely at present. Will sign off at this time.  Please reconsult if a change in clinical status warrants re-evaluation of dosage.

## 2017-02-24 ENCOUNTER — Inpatient Hospital Stay (HOSPITAL_COMMUNITY): Payer: Medicare Other

## 2017-02-24 ENCOUNTER — Encounter (HOSPITAL_COMMUNITY): Payer: Self-pay

## 2017-02-24 DIAGNOSIS — N3000 Acute cystitis without hematuria: Secondary | ICD-10-CM

## 2017-02-24 LAB — CBC
HEMATOCRIT: 28.8 % — AB (ref 39.0–52.0)
Hemoglobin: 9.1 g/dL — ABNORMAL LOW (ref 13.0–17.0)
MCH: 27.3 pg (ref 26.0–34.0)
MCHC: 31.6 g/dL (ref 30.0–36.0)
MCV: 86.5 fL (ref 78.0–100.0)
Platelets: 163 10*3/uL (ref 150–400)
RBC: 3.33 MIL/uL — ABNORMAL LOW (ref 4.22–5.81)
RDW: 17.6 % — AB (ref 11.5–15.5)
WBC: 17.1 10*3/uL — ABNORMAL HIGH (ref 4.0–10.5)

## 2017-02-24 LAB — BASIC METABOLIC PANEL
Anion gap: 6 (ref 5–15)
BUN: 43 mg/dL — AB (ref 6–20)
CALCIUM: 8.7 mg/dL — AB (ref 8.9–10.3)
CO2: 16 mmol/L — AB (ref 22–32)
Chloride: 119 mmol/L — ABNORMAL HIGH (ref 101–111)
Creatinine, Ser: 2.07 mg/dL — ABNORMAL HIGH (ref 0.61–1.24)
GFR calc Af Amer: 32 mL/min — ABNORMAL LOW (ref >60)
GFR, EST NON AFRICAN AMERICAN: 28 mL/min — AB (ref >60)
Glucose, Bld: 116 mg/dL — ABNORMAL HIGH (ref 65–99)
Potassium: 4.8 mmol/L (ref 3.5–5.1)
Sodium: 141 mmol/L (ref 135–145)

## 2017-02-24 LAB — TROPONIN I: TROPONIN I: 0.04 ng/mL — AB (ref <0.03)

## 2017-02-24 MED ORDER — SODIUM CHLORIDE 0.9 % IV SOLN
INTRAVENOUS | Status: DC
  Administered 2017-02-24 – 2017-02-25 (×3): via INTRAVENOUS

## 2017-02-24 MED ORDER — LIDOCAINE 5 % EX OINT
TOPICAL_OINTMENT | Freq: Four times a day (QID) | CUTANEOUS | Status: DC | PRN
  Filled 2017-02-24: qty 35.44

## 2017-02-24 MED ORDER — LIDOCAINE VISCOUS 2 % MT SOLN
15.0000 mL | OROMUCOSAL | Status: DC | PRN
  Administered 2017-02-24: 15 mL via OROMUCOSAL
  Filled 2017-02-24 (×2): qty 15

## 2017-02-24 MED ORDER — BRINZOLAMIDE 1 % OP SUSP: 1.0000 [drp] | Freq: Two times a day (BID) | OPHTHALMIC | Status: DC

## 2017-02-24 NOTE — Progress Notes (Signed)
PROGRESS NOTE    Nicholas Porter  EXB:[id number] DOB: [birth year] DOA: 02/23/2017 PCP: Cathlean Cower, MD     Brief Narrative:  Hendrick Pavich is a 83 y.o. male with medical history significant of CKD, HTN, CAD comes in with over a week of dysuria, urinary retention, urgency , hesitancy and progressive worsening weakness. He was recently evaluated by his PCP and was placed on Macrobid 2 days ago for urinary tract infection. He states that this has not helped much. He continues to complain of dysuria, urinary frequency and urgency. He also reports low-grade fever at home of 100.4. Patient was admitted for further treatment of UTI.  Assessment & Plan:   Principal Problem:   UTI (urinary tract infection) Active Problems:   Essential hypertension   CAD S/P percutaneous coronary angioplasty - DES in RCA with PTCA for ISR; Moderate mLAD, 80% ostial OM2 stable   Acute renal failure superimposed on stage 4 chronic kidney disease (HCC)   Anemia of chronic renal failure, stage 4 (severe) (HCC)   Metabolic acidosis   Urinary tract infection, present on admission  -Likely not prostatitis, DRE in ED was unremarkable -Previous urine culture positive for Klebsiella, sensitive to Rocephin  -Repeat urine culture pending -Blood culture pending -Continue Rocephin  Acute kidney injury on chronic kidney disease stage IV -Baseline creatinine of 1.6 -Continue IV fluids -Trend BMP -Renal ultrasound  Essential hypertension -Hold ARB due to acute kidney injury  CAD -Currently stable, no complaints of CP   Chronic anemia of chronic renal failure -Currently stable   DVT prophylaxis: SCDs Code Status: Full code Family Communication: Wife at bedside Disposition Plan: Pending further improvement, suspect discharge back home   Consultants:   None  Procedures:   None  Antimicrobials:   Rocephin    Subjective: Patient's main complaint remains dysuria, urinary frequency, pain around his mouth.  Regarding mouth sores. He states that he had a fever of 100.4 at home. He has been taking his antibiotics for 2 days as outpatient without any improvement. No other complaints of chest pain, abdominal pain, shortness of breath.  Objective: Vitals:   02/23/17 2130 02/23/17 2215 02/23/17 2250 02/24/17 0409  BP: 164/56 170/64 (!) 189/63 (!) 164/71  Pulse: (!) 58 63 63 78  Resp: 22 17 20  (!) 22  Temp:   98.2 F (36.8 C) 98.5 F (36.9 C)  TempSrc:   Oral Oral  SpO2: 97% 99% 100% 100%  Height:   5\' 7"  (1.702 m)     Intake/Output Summary (Last 24 hours) at 02/24/17 1217 Last data filed at 02/24/17 1027  Gross per 24 hour  Intake                0 ml  Output              500 ml  Net             -500 ml   There were no vitals filed for this visit.  Examination:  General exam: Appears calm and comfortable  Respiratory system: Clear to auscultation. Respiratory effort normal. Cardiovascular system: S1 & S2 heard, RRR. No JVD, murmurs, rubs, gallops or clicks. No pedal edema. Gastrointestinal system: Abdomen is nondistended, soft and nontender. No organomegaly or masses felt. Normal bowel sounds heard. Central nervous system: Alert and oriented. No focal neurological deficits. Extremities: Symmetric 5 x 5 power. Psychiatry: Judgement and insight appear normal. Mood & affect appropriate.   Data Reviewed: I have personally reviewed following labs and imaging  studies  CBC:  Recent Labs Lab 02/23/17 1848 02/24/17 0359  WBC 15.5* 17.1*  NEUTROABS 13.2*  --   HGB 9.7* 9.1*  HCT 30.7* 28.8*  MCV 86.0 86.5  PLT 168 829   Basic Metabolic Panel:  Recent Labs Lab 02/23/17 1848 02/24/17 0359  NA 139 141  K 5.4* 4.8  CL 113* 119*  CO2 18* 16*  GLUCOSE 130* 116*  BUN 48* 43*  CREATININE 2.23* 2.07*  CALCIUM 9.2 8.7*   GFR: Estimated Creatinine Clearance: 21.4 mL/min (by C-G formula based on SCr of 2.07 mg/dL (H)). Liver Function Tests:  Recent Labs Lab 02/23/17 1848  AST  17  ALT 18  ALKPHOS 64  BILITOT 0.6  PROT 7.4  ALBUMIN 3.7   No results for input(s): LIPASE, AMYLASE in the last 168 hours. No results for input(s): AMMONIA in the last 168 hours. Coagulation Profile: No results for input(s): INR, PROTIME in the last 168 hours. Cardiac Enzymes:  Recent Labs Lab 02/24/17 0631  TROPONINI 0.04*   BNP (last 3 results) No results for input(s): PROBNP in the last 8760 hours. HbA1C: No results for input(s): HGBA1C in the last 72 hours. CBG: No results for input(s): GLUCAP in the last 168 hours. Lipid Profile: No results for input(s): CHOL, HDL, LDLCALC, TRIG, CHOLHDL, LDLDIRECT in the last 72 hours. Thyroid Function Tests: No results for input(s): TSH, T4TOTAL, FREET4, T3FREE, THYROIDAB in the last 72 hours. Anemia Panel: No results for input(s): VITAMINB12, FOLATE, FERRITIN, TIBC, IRON, RETICCTPCT in the last 72 hours. Sepsis Labs:  Recent Labs Lab 02/23/17 1856  LATICACIDVEN 1.13    Recent Results (from the past 240 hour(s))  Culture, blood (routine x 2)     Status: None (Preliminary result)   Collection Time: 02/23/17  7:07 PM  Result Value Ref Range Status   Specimen Description BLOOD RIGHT HAND  Final   Special Requests BOTTLES DRAWN AEROBIC AND ANAEROBIC 5CC  Final   Culture PENDING  Incomplete   Report Status PENDING  Incomplete  Culture, blood (routine x 2)     Status: None (Preliminary result)   Collection Time: 02/23/17  7:12 PM  Result Value Ref Range Status   Specimen Description BLOOD RIGHT FOREARM  Final   Special Requests BOTTLES DRAWN AEROBIC AND ANAEROBIC 5CC  Final   Culture PENDING  Incomplete   Report Status PENDING  Incomplete       Radiology Studies: Dg Chest 2 View  Result Date: 02/23/2017 CLINICAL DATA:  Recent cough, congestion and sore throat. Cold sores. Recent diagnosis of UTI. Chronic shortness of breath due to heart attack 3 years ago. EXAM: CHEST  2 VIEW COMPARISON:  Chest x-ray dated 07/29/2015.  FINDINGS: Heart size and mediastinal contours are normal. Atherosclerotic changes again noted at the aortic arch. Lungs are hyperexpanded. Suspect associated chronic bronchitic changes centrally. Lungs otherwise clear. No pleural effusion or pneumothorax seen. Degenerative spurring again noted throughout the slightly scoliotic thoracic spine. No acute or suspicious osseous finding. IMPRESSION: 1. Hyperexpanded lungs suggesting COPD. Suspect associated chronic bronchitic changes centrally. 2. No acute findings.  No evidence of pneumonia or pulmonary edema. 3. Aortic atherosclerosis. Electronically Signed   By: Franki Cabot M.D.   On: 02/23/2017 19:47      Scheduled Meds: . cefTRIAXone (ROCEPHIN)  IV  1 g Intravenous QHS  . latanoprost  1 drop Both Eyes QHS   Continuous Infusions:   LOS: 1 day    Time spent: 40 minutes   Dessa Phi,  DO [facility] [email or web address] Password [facility] 02/24/2017, 12:17 PM

## 2017-02-25 LAB — BASIC METABOLIC PANEL
ANION GAP: 6 (ref 5–15)
BUN: 31 mg/dL — AB (ref 6–20)
CALCIUM: 8.4 mg/dL — AB (ref 8.9–10.3)
CO2: 15 mmol/L — ABNORMAL LOW (ref 22–32)
CREATININE: 2 mg/dL — AB (ref 0.61–1.24)
Chloride: 122 mmol/L — ABNORMAL HIGH (ref 101–111)
GFR calc Af Amer: 33 mL/min — ABNORMAL LOW (ref >60)
GFR calc non Af Amer: 29 mL/min — ABNORMAL LOW (ref >60)
GLUCOSE: 106 mg/dL — AB (ref 65–99)
Potassium: 4.7 mmol/L (ref 3.5–5.1)
Sodium: 143 mmol/L (ref 135–145)

## 2017-02-25 LAB — CBC WITH DIFFERENTIAL/PLATELET
BASOS PCT: 0 %
Basophils Absolute: 0 10*3/uL (ref 0.0–0.1)
Eosinophils Absolute: 0 10*3/uL (ref 0.0–0.7)
Eosinophils Relative: 0 %
HEMATOCRIT: 24.5 % — AB (ref 39.0–52.0)
Hemoglobin: 7.7 g/dL — ABNORMAL LOW (ref 13.0–17.0)
LYMPHS ABS: 1.2 10*3/uL (ref 0.7–4.0)
Lymphocytes Relative: 8 %
MCH: 27.5 pg (ref 26.0–34.0)
MCHC: 31.4 g/dL (ref 30.0–36.0)
MCV: 87.5 fL (ref 78.0–100.0)
MONO ABS: 1.3 10*3/uL — AB (ref 0.1–1.0)
MONOS PCT: 9 %
NEUTROS ABS: 12.1 10*3/uL — AB (ref 1.7–7.7)
Neutrophils Relative %: 83 %
Platelets: 151 10*3/uL (ref 150–400)
RBC: 2.8 MIL/uL — ABNORMAL LOW (ref 4.22–5.81)
RDW: 17.6 % — AB (ref 11.5–15.5)
WBC: 14.6 10*3/uL — ABNORMAL HIGH (ref 4.0–10.5)

## 2017-02-25 MED ORDER — BENZOCAINE 10 % MT GEL
Freq: Four times a day (QID) | OROMUCOSAL | Status: DC | PRN
  Filled 2017-02-25: qty 9.4

## 2017-02-25 NOTE — Evaluation (Signed)
Physical Therapy Evaluation Patient Details Name: Nicholas Porter MRN: [id number] DOB: 1930-02-27 Today's Date: 02/25/2017   History of Present Illness  83 yo male admitted with UTI, weakness. Hx of CKD, HTN, CAD, anemia.   Clinical Impression  On eval, pt was Min guard assist for mobility. He walked ~75 feet in the hallway with intermittent use of hallway handrail. Dyspnea 2/4. Pt presents with mild general weakness and decreased activity tolerance. He c/o diarrhea on today and is hesitant to mobilize. Recommend daily ambulation in hallway with nursing supervision. Recommend HHPT follow up if pt is agreeable.     Follow Up Recommendations Home health PT (if pt agreeable)    Equipment Recommendations  None recommended by PT    Recommendations for Other Services       Precautions / Restrictions Precautions Precautions: Fall Restrictions Weight Bearing Restrictions: No      Mobility  Bed Mobility Overal bed mobility: Modified Independent                Transfers Overall transfer level: Modified independent                  Ambulation/Gait Ambulation/Gait assistance: Min guard Ambulation Distance (Feet): 75 Feet Assistive device: None Gait Pattern/deviations: Step-through pattern;Decreased stride length;Decreased step length - right;Decreased step length - left;Antalgic     General Gait Details: Pt c/o bil feet pain with ambulation (he prefers to walk in shoes). Close guard for safety. Intermittent use of hallway handrail. Dyspnea 2/4  Stairs            Wheelchair Mobility    Modified Rankin (Stroke Patients Only)       Balance                                             Pertinent Vitals/Pain Pain Assessment: Faces Faces Pain Scale: Hurts a little bit Pain Location: bil feet with ambulation Pain Descriptors / Indicators: Sore Pain Intervention(s): Monitored during session    Home Living Family/patient expects to be  discharged to:: Private residence Living Arrangements: Spouse/significant other Available Help at Discharge: Family Type of Home: House Home Access: Stairs to enter Entrance Stairs-Rails: Right Entrance Stairs-Number of Steps: 2 Home Layout: One level Home Equipment: None      Prior Function Level of Independence: Independent               Hand Dominance        Extremity/Trunk Assessment   Upper Extremity Assessment Upper Extremity Assessment: Overall WFL for tasks assessed    Lower Extremity Assessment Lower Extremity Assessment: Generalized weakness    Cervical / Trunk Assessment Cervical / Trunk Assessment: Kyphotic  Communication   Communication: No difficulties  Cognition Arousal/Alertness: Awake/alert Behavior During Therapy: WFL for tasks assessed/performed Overall Cognitive Status: Within Functional Limits for tasks assessed                      General Comments      Exercises     Assessment/Plan    PT Assessment Patient needs continued PT services  PT Problem List Decreased strength;Decreased mobility;Decreased activity tolerance;Pain       PT Treatment Interventions DME instruction;Gait training;Therapeutic activities;Therapeutic exercise;Patient/family education;Functional mobility training    PT Goals (Current goals can be found in the Care Plan section)  Acute Rehab PT Goals Patient Stated Goal: home soon.  PT Goal Formulation: With patient/family Time For Goal Achievement: 03/11/17 Potential to Achieve Goals: Good    Frequency Min 3X/week   Barriers to discharge        Co-evaluation               End of Session   Activity Tolerance: Patient tolerated treatment well Patient left: in bed;with call bell/phone within reach;with family/visitor present;with bed alarm set   PT Visit Diagnosis: Difficulty in walking, not elsewhere classified (R26.2)         Time: 1449-1510 PT Time Calculation (min) (ACUTE ONLY):  21 min   Charges:   PT Evaluation $PT Eval Low Complexity: 1 Procedure     PT G Codes:         Weston Anna, MPT Pager: [phone]

## 2017-02-25 NOTE — Progress Notes (Signed)
PROGRESS NOTE    Nicholas Porter  WNI:[id number] DOB: 18-Jul-1930 DOA: 02/23/2017 PCP: Cathlean Cower, MD     Brief Narrative:  Nicholas Porter is a 83 y.o. male with medical history significant of CKD, HTN, CAD comes in with over a week of dysuria, urinary retention, urgency , hesitancy and progressive worsening weakness. He was recently evaluated by his PCP and was placed on Macrobid 2 days ago for urinary tract infection. He states that this has not helped much. He continues to complain of dysuria, urinary frequency and urgency. He also reports low-grade fever at home of 100.4. Patient was admitted for further treatment of UTI.  Assessment & Plan:   Principal Problem:   UTI (urinary tract infection) Active Problems:   Essential hypertension   CAD S/P percutaneous coronary angioplasty - DES in RCA with PTCA for ISR; Moderate mLAD, 80% ostial OM2 stable   Acute renal failure superimposed on stage 4 chronic kidney disease (HCC)   Anemia of chronic renal failure, stage 4 (severe) (HCC)   Metabolic acidosis   Urinary tract infection, present on admission  -Likely not prostatitis, DRE in ED was unremarkable -Previous urine culture 2/22 positive for Klebsiella, sensitive to Rocephin  -Repeat urine culture 3/4 pending -Blood culture pending -Continue Rocephin for now   Acute kidney injury on chronic kidney disease stage IV -Baseline creatinine of 1.6 -Continue IV fluids -Trend BMP -Renal ultrasound unremkarble  Essential hypertension -Hold ARB due to acute kidney injury  CAD -Currently stable, no complaints of CP   Chronic anemia of chronic renal failure -Currently stable   DVT prophylaxis: SCDs Code Status: Full code Family Communication: Wife at bedside Disposition Plan: Pending further improvement, suspect discharge back home. PT to eval    Consultants:   None  Procedures:   None  Antimicrobials:   Rocephin    Subjective: Main complaint is lack of appetite,  generalized weakness. His dysuria has improved with antibiotics.  Objective: Vitals:   02/24/17 2020 02/25/17 0146 02/25/17 0417 02/25/17 0454  BP: (!) 163/70  (!) 170/62 (!) 172/62  Pulse: 68  72   Resp: 18  18   Temp: 100.3 F (37.9 C)  98.7 F (37.1 C)   TempSrc: Oral  Oral   SpO2: 99%  100% 99%  Weight:  55.3 kg (122 lb)    Height:        Intake/Output Summary (Last 24 hours) at 02/25/17 0933 Last data filed at 02/25/17 0843  Gross per 24 hour  Intake          1401.67 ml  Output              400 ml  Net          1001.67 ml   Filed Weights   02/25/17 0146  Weight: 55.3 kg (122 lb)    Examination:  General exam: Appears calm and comfortable  Respiratory system: Clear to auscultation. Respiratory effort normal. Cardiovascular system: S1 & S2 heard, RRR. No JVD, murmurs, rubs, gallops or clicks. No pedal edema. Gastrointestinal system: Abdomen is nondistended, soft and nontender. No organomegaly or masses felt. Normal bowel sounds heard. Central nervous system: Alert and oriented. No focal neurological deficits. Extremities: Symmetric 5 x 5 power. Psychiatry: Judgement and insight appear normal. Mood & affect appropriate.   Data Reviewed: I have personally reviewed following labs and imaging studies  CBC:  Recent Labs Lab 02/23/17 1848 02/24/17 0359 02/25/17 0347  WBC 15.5* 17.1* 14.6*  NEUTROABS 13.2*  --  12.1*  HGB 9.7* 9.1* 7.7*  HCT 30.7* 28.8* 24.5*  MCV 86.0 86.5 87.5  PLT 168 163 037   Basic Metabolic Panel:  Recent Labs Lab 02/23/17 1848 02/24/17 0359 02/25/17 0347  NA 139 141 143  K 5.4* 4.8 4.7  CL 113* 119* 122*  CO2 18* 16* 15*  GLUCOSE 130* 116* 106*  BUN 48* 43* 31*  CREATININE 2.23* 2.07* 2.00*  CALCIUM 9.2 8.7* 8.4*   GFR: Estimated Creatinine Clearance: 21.1 mL/min (by C-G formula based on SCr of 2 mg/dL (H)). Liver Function Tests:  Recent Labs Lab 02/23/17 1848  AST 17  ALT 18  ALKPHOS 64  BILITOT 0.6  PROT 7.4    ALBUMIN 3.7   No results for input(s): LIPASE, AMYLASE in the last 168 hours. No results for input(s): AMMONIA in the last 168 hours. Coagulation Profile: No results for input(s): INR, PROTIME in the last 168 hours. Cardiac Enzymes:  Recent Labs Lab 02/24/17 0631  TROPONINI 0.04*   BNP (last 3 results) No results for input(s): PROBNP in the last 8760 hours. HbA1C: No results for input(s): HGBA1C in the last 72 hours. CBG: No results for input(s): GLUCAP in the last 168 hours. Lipid Profile: No results for input(s): CHOL, HDL, LDLCALC, TRIG, CHOLHDL, LDLDIRECT in the last 72 hours. Thyroid Function Tests: No results for input(s): TSH, T4TOTAL, FREET4, T3FREE, THYROIDAB in the last 72 hours. Anemia Panel: No results for input(s): VITAMINB12, FOLATE, FERRITIN, TIBC, IRON, RETICCTPCT in the last 72 hours. Sepsis Labs:  Recent Labs Lab 02/23/17 1856  LATICACIDVEN 1.13    Recent Results (from the past 240 hour(s))  Culture, blood (routine x 2)     Status: None (Preliminary result)   Collection Time: 02/23/17  7:07 PM  Result Value Ref Range Status   Specimen Description BLOOD RIGHT HAND  Final   Special Requests BOTTLES DRAWN AEROBIC AND ANAEROBIC 5CC  Final   Culture   Final    NO GROWTH < 24 HOURS Performed at [facility], [place] [address], [place], [place] [address]    Report Status PENDING  Incomplete  Culture, blood (routine x 2)     Status: None (Preliminary result)   Collection Time: 02/23/17  7:12 PM  Result Value Ref Range Status   Specimen Description BLOOD RIGHT FOREARM  Final   Special Requests BOTTLES DRAWN AEROBIC AND ANAEROBIC 5CC  Final   Culture   Final    NO GROWTH < 24 HOURS Performed at [facility], [place] [address] [place], [place] [address]    Report Status PENDING  Incomplete       Radiology Studies: Dg Chest 2 View  Result Date: 02/23/2017 CLINICAL DATA:  Recent cough, congestion and sore throat. Cold sores. Recent diagnosis  of UTI. Chronic shortness of breath due to heart attack 3 years ago. EXAM: CHEST  2 VIEW COMPARISON:  Chest x-ray dated 07/29/2015. FINDINGS: Heart size and mediastinal contours are normal. Atherosclerotic changes again noted at the aortic arch. Lungs are hyperexpanded. Suspect associated chronic bronchitic changes centrally. Lungs otherwise clear. No pleural effusion or pneumothorax seen. Degenerative spurring again noted throughout the slightly scoliotic thoracic spine. No acute or suspicious osseous finding. IMPRESSION: 1. Hyperexpanded lungs suggesting COPD. Suspect associated chronic bronchitic changes centrally. 2. No acute findings.  No evidence of pneumonia or pulmonary edema. 3. Aortic atherosclerosis. Electronically Signed   By: Franki Cabot M.D.   On: 02/23/2017 19:47   US Renal  Result Date:  02/24/2017 CLINICAL DATA:  Acute kidney injury. Stage 3 chronic kidney disease. EXAM: RENAL / URINARY TRACT ULTRASOUND COMPLETE COMPARISON:  CT scan dated 07/27/2015 FINDINGS: Right Kidney: Length: 9.0 cm. Echogenicity within normal limits. No mass or hydronephrosis visualized. Left Kidney: Length: 8.5 cm. Echogenicity within normal limits. No mass or hydronephrosis visualized. Bladder: Appears normal for degree of bladder distention. IMPRESSION: Normal exam. Electronically Signed   By: Lorriane Shire M.D.   On: 02/24/2017 15:42      Scheduled Meds: . cefTRIAXone (ROCEPHIN)  IV  1 g Intravenous QHS  . latanoprost  1 drop Both Eyes QHS   Continuous Infusions:   LOS: 2 days    Time spent: 30 minutes   Dessa Phi, DO [facility] [email or web address] Password [facility] 02/25/2017, 9:33 AM

## 2017-02-25 NOTE — Progress Notes (Signed)
Initial Nutrition Assessment  DOCUMENTATION CODES:   Non-severe (moderate) malnutrition in context of chronic illness  INTERVENTION:   Provide Boost Breeze po TID, each supplement provides 250 kcal and 9 grams of protein Encourage PO intake RD to continue to monitor  NUTRITION DIAGNOSIS:   Inadequate oral intake related to poor appetite as evidenced by per patient/family report.  GOAL:   Patient will meet greater than or equal to 90% of their needs  MONITOR:   PO intake, Supplement acceptance, Labs, Weight trends, I & O's  REASON FOR ASSESSMENT:   Consult Assessment of nutrition requirement/status  ASSESSMENT:   83 y.o. male with medical history significant of CKD, HTN, CAD comes in with over a week of dysuria, urinary retention, urgency , hesitancy and progressive worsening weakness. He was recently evaluated by his PCP and was placed on Macrobid 2 days ago for urinary tract infection. He states that this has not helped much. He continues to complain of dysuria, urinary frequency and urgency. He also reports low-grade fever at home of 100.4. Patient was admitted for further treatment of UTI.  Patient in room with wife at bedside. Pt states ever since he had a heart attack in 1982 he lost weight and has been unable to gain the weight back. Pt with history of partial gastrectomy. Pt states he tends to run to the bathroom after he eats. Pt's wife states he is afraid to eat sometimes. Pt states his stool is normally formed, but recently he has been having loose stools and continues to do so. Pt states he usually eats ~2 meals a day and will have an evening snack. For breakfast he eats 1 fried egg and toast with coffee, and for lunch he will have a mini frozen hamburger or grilled cheese. He only drinks water, states he gave up [employer] after his doctor told him to. Pt is agreeable to trying Boost Breeze supplements, RD will order.  Pt states UBW of 127 lb recently. Pt has lost 5 lb  since 2/22 (2% wt loss x 1.5 weeks, significant for time frame). Nutrition-Focused physical exam completed. Findings are mild fat depletion, mild muscle depletion, and no edema.   Medications reviewed. Labs reviewed: GFR: 29  Diet Order:  Diet Heart Room service appropriate? Yes; Fluid consistency: Thin  Skin:  Reviewed, no issues  Last BM:  3/6  Height:   Ht Readings from Last 1 Encounters:  02/23/17 5\' 7"  (1.702 m)    Weight:   Wt Readings from Last 1 Encounters:  02/25/17 122 lb (55.3 kg)    Ideal Body Weight:  67.3 kg  BMI:  Body mass index is 19.11 kg/m.  Estimated Nutritional Needs:   Kcal:  1550-1750  Protein:  70-80g  Fluid:  1.7L/day  EDUCATION NEEDS:   Education needs addressed  Clayton Bibles, MS, RD, LDN Pager: [phone] After Hours Pager: [phone]

## 2017-02-26 ENCOUNTER — Ambulatory Visit: Payer: Medicare Other | Admitting: Adult Health

## 2017-02-26 LAB — BASIC METABOLIC PANEL
ANION GAP: 7 (ref 5–15)
BUN: 26 mg/dL — AB (ref 6–20)
CHLORIDE: 122 mmol/L — AB (ref 101–111)
CO2: 16 mmol/L — AB (ref 22–32)
Calcium: 8.7 mg/dL — ABNORMAL LOW (ref 8.9–10.3)
Creatinine, Ser: 1.82 mg/dL — ABNORMAL HIGH (ref 0.61–1.24)
GFR calc Af Amer: 37 mL/min — ABNORMAL LOW (ref >60)
GFR, EST NON AFRICAN AMERICAN: 32 mL/min — AB (ref >60)
GLUCOSE: 99 mg/dL (ref 65–99)
POTASSIUM: 4.6 mmol/L (ref 3.5–5.1)
Sodium: 145 mmol/L (ref 135–145)

## 2017-02-26 LAB — CBC WITH DIFFERENTIAL/PLATELET
BASOS PCT: 0 %
Basophils Absolute: 0 10*3/uL (ref 0.0–0.1)
EOS ABS: 0 10*3/uL (ref 0.0–0.7)
EOS PCT: 0 %
HEMATOCRIT: 26.6 % — AB (ref 39.0–52.0)
Hemoglobin: 8.3 g/dL — ABNORMAL LOW (ref 13.0–17.0)
Lymphocytes Relative: 10 %
Lymphs Abs: 1.3 10*3/uL (ref 0.7–4.0)
MCH: 27.4 pg (ref 26.0–34.0)
MCHC: 31.2 g/dL (ref 30.0–36.0)
MCV: 87.8 fL (ref 78.0–100.0)
MONO ABS: 1 10*3/uL (ref 0.1–1.0)
Monocytes Relative: 8 %
NEUTROS ABS: 10.8 10*3/uL — AB (ref 1.7–7.7)
Neutrophils Relative %: 82 %
PLATELETS: 156 10*3/uL (ref 150–400)
RBC: 3.03 MIL/uL — ABNORMAL LOW (ref 4.22–5.81)
RDW: 17.4 % — ABNORMAL HIGH (ref 11.5–15.5)
WBC: 13.1 10*3/uL — ABNORMAL HIGH (ref 4.0–10.5)

## 2017-02-26 MED ORDER — METOPROLOL SUCCINATE ER 25 MG PO TB24
25.0000 mg | ORAL_TABLET | Freq: Every day | ORAL | Status: DC
  Administered 2017-02-26 – 2017-02-28 (×3): 25 mg via ORAL
  Filled 2017-02-26 (×3): qty 1

## 2017-02-26 MED ORDER — BOOST / RESOURCE BREEZE PO LIQD
1.0000 | Freq: Three times a day (TID) | ORAL | Status: DC
  Administered 2017-02-26 – 2017-02-27 (×3): 1 via ORAL

## 2017-02-26 MED ORDER — PSYLLIUM 95 % PO PACK
1.0000 | PACK | Freq: Every day | ORAL | Status: DC
  Administered 2017-02-26 – 2017-02-27 (×2): 1 via ORAL
  Filled 2017-02-26 (×2): qty 1

## 2017-02-26 MED ORDER — CLOPIDOGREL BISULFATE 75 MG PO TABS
75.0000 mg | ORAL_TABLET | Freq: Every day | ORAL | Status: DC
Start: 2017-02-26 — End: 2017-02-28
  Administered 2017-02-26 – 2017-02-28 (×3): 75 mg via ORAL
  Filled 2017-02-26 (×3): qty 1

## 2017-02-26 NOTE — Progress Notes (Signed)
Physical Therapy Treatment Patient Details Name: Nicholas Porter MRN: [id number] DOB: 07/14/1930 Today's Date: 02/26/2017    History of Present Illness 83 yo male admitted with UTI, weakness. Hx of CKD, HTN, CAD, anemia.     PT Comments    Mod encouragement for participation. Pt not feeling well on today. He continues to c/o diarrhea. Pt required increased assistance on today compared to yesterday. Min assist to stand and ambulate in hallway. Discussed d/c plan-pt states he will return home. When asked if he could consider ST rehab, pt stated "no, I'll be fine at home." Instructed pt to use a RW for ambulation safety. Encouraged pt and wife to think about a short term rehab stay to get stronger before returning home. Will continue to follow and progress activity as tolerated. Highly recommend OOB/ambulation with nursing supervision/assistance as able to increase activity level.     Follow Up Recommendations  Home health PT;Supervision/Assistance - 24 hour (pt declined SNF when discussed during PT session on today)     Equipment Recommendations  None recommended by PT (pt stated he has access to a walker and cane at home)    Recommendations for Other Services       Precautions / Restrictions Precautions Precautions: Fall Restrictions Weight Bearing Restrictions: No    Mobility  Bed Mobility Overal bed mobility: Modified Independent                Transfers Overall transfer level: Needs assistance   Transfers: Sit to/from Stand Sit to Stand: Min assist         General transfer comment: Assist to rise, stabilize, control descent. Unsteady.   Ambulation/Gait Ambulation/Gait assistance: Min assist Ambulation Distance (Feet): 60 Feet Assistive device: 1 person hand held assist Gait Pattern/deviations: Step-through pattern;Decreased stride length     General Gait Details: Assist to stabilize. Pt intermittently reaching out for hallway handrail to hold on to as  well. dyspnea 2/4. O2 sats >90% on RA. Pt fatigued easily on today.    Stairs            Wheelchair Mobility    Modified Rankin (Stroke Patients Only)       Balance Overall balance assessment: Needs assistance;History of Falls           Standing balance-Leahy Scale: Poor                      Cognition Arousal/Alertness: Awake/alert Behavior During Therapy: WFL for tasks assessed/performed Overall Cognitive Status: Within Functional Limits for tasks assessed                      Exercises      General Comments        Pertinent Vitals/Pain Pain Assessment: Faces Faces Pain Scale: Hurts a little bit Pain Location: bil feet with ambulation Pain Descriptors / Indicators: Sore Pain Intervention(s): Monitored during session    Home Living                      Prior Function            PT Goals (current goals can now be found in the care plan section) Progress towards PT goals: Progressing toward goals    Frequency    Min 3X/week      PT Plan Current plan remains appropriate    Co-evaluation             End of Session Equipment Utilized During  Treatment: Gait belt Activity Tolerance: Patient limited by fatigue Patient left: in bed;with call bell/phone within reach;with family/visitor present;with bed alarm set   PT Visit Diagnosis: Difficulty in walking, not elsewhere classified (R26.2);Muscle weakness (generalized) (M62.81)     Time: 1355-1420 PT Time Calculation (min) (ACUTE ONLY): 25 min  Charges:  $Gait Training: 8-22 mins                    G Codes:       Weston Anna, MPT Pager: [phone]

## 2017-02-26 NOTE — Progress Notes (Signed)
PROGRESS NOTE    Nicholas Porter  WCH:[id number] DOB: 15-Jun-1930 DOA: 02/23/2017 PCP: Cathlean Cower, MD     Brief Narrative:  Nicholas Porter is a 83 y.o. male with medical history significant of CKD, HTN, CAD comes in with over a week of dysuria, urinary retention, urgency , hesitancy and progressive worsening weakness. He was recently evaluated by his PCP and was placed on Macrobid 2 days ago for urinary tract infection. He states that this has not helped much. He continues to complain of dysuria, urinary frequency and urgency. He also reports low-grade fever at home of 100.4. Patient was admitted for further treatment of UTI.  Assessment & Plan:   Principal Problem:   UTI (urinary tract infection) Active Problems:   Essential hypertension   CAD S/P percutaneous coronary angioplasty - DES in RCA with PTCA for ISR; Moderate mLAD, 80% ostial OM2 stable   Acute renal failure superimposed on stage 4 chronic kidney disease (HCC)   Anemia of chronic renal failure, stage 4 (severe) (HCC)   Metabolic acidosis   Urinary tract infection, present on admission  -Likely not prostatitis, DRE in ED was unremarkable -Previous urine culture 2/22 positive for Klebsiella, sensitive to Rocephin  -Repeat urine culture 3/4 with GNR, MIC pending  -Blood culture negative at 2 days  -Continue Rocephin for now until MIC available. Also patient with new fevers overnight so will keep IV antibiotics.   Acute kidney injury on chronic kidney disease stage IV -Baseline creatinine of 1.6, improved with IVF  -Renal ultrasound unremkarble -Trend BMP  Essential hypertension -Hold ARB due to acute kidney injury -Continue metoprolol   CAD -Currently stable, no complaints of CP  -Continue plavix   Chronic anemia of chronic renal failure -Currently stable   DVT prophylaxis: SCDs Code Status: Full code Family Communication: Wife at bedside Disposition Plan: Pending further improvement, suspect discharge back  home with HHPT    Consultants:   None  Procedures:   None  Antimicrobials:   Rocephin    Subjective: Main complaint is lack of appetite, but sounds more like he is very picky with his food. He states that when food tray gets to his room, it is cold and he refuses to eat cold food. We discussed nutrition supplements like boost, ensure and he asks if he can eat ICEE or ice cream. We discussed importance of protein in diet as well as fruits and vegetables. Discussed that we can change diet to regular diet today and he may increase his nutrition and oral intake. Otherwise, no new complaints. Dysuria resolved.    Objective: Vitals:   02/25/17 2029 02/25/17 2222 02/26/17 0408 02/26/17 0411  BP: (!) 175/62  (!) 179/65   Pulse: 73  64   Resp: 18  18   Temp: (!) 101.3 F (38.5 C) (!) 100.7 F (38.2 C) 98.4 F (36.9 C)   TempSrc: Oral Oral Oral   SpO2: 100%  100%   Weight:    56 kg (123 lb 7.3 oz)  Height:        Intake/Output Summary (Last 24 hours) at 02/26/17 0808 Last data filed at 02/26/17 0648  Gross per 24 hour  Intake              650 ml  Output              500 ml  Net              150 ml   Filed Weights   02/25/17  0146 02/26/17 0411  Weight: 55.3 kg (122 lb) 56 kg (123 lb 7.3 oz)    Examination:  General exam: Appears calm and comfortable  Respiratory system: Clear to auscultation. Respiratory effort normal. Cardiovascular system: S1 & S2 heard, RRR. No JVD, murmurs, rubs, gallops or clicks. No pedal edema. Gastrointestinal system: Abdomen is nondistended, soft and nontender. No organomegaly or masses felt. Normal bowel sounds heard. Central nervous system: Alert and oriented. No focal neurological deficits. Extremities: Symmetric 5 x 5 power. Psychiatry: Judgement and insight appear normal. Mood & affect appropriate.   Data Reviewed: I have personally reviewed following labs and imaging studies  CBC:  Recent Labs Lab 02/23/17 1848 02/24/17 0359  02/25/17 0347 02/26/17 0403  WBC 15.5* 17.1* 14.6* 13.1*  NEUTROABS 13.2*  --  12.1* 10.8*  HGB 9.7* 9.1* 7.7* 8.3*  HCT 30.7* 28.8* 24.5* 26.6*  MCV 86.0 86.5 87.5 87.8  PLT 168 163 151 902   Basic Metabolic Panel:  Recent Labs Lab 02/23/17 1848 02/24/17 0359 02/25/17 0347 02/26/17 0403  NA 139 141 143 145  K 5.4* 4.8 4.7 4.6  CL 113* 119* 122* 122*  CO2 18* 16* 15* 16*  GLUCOSE 130* 116* 106* 99  BUN 48* 43* 31* 26*  CREATININE 2.23* 2.07* 2.00* 1.82*  CALCIUM 9.2 8.7* 8.4* 8.7*   GFR: Estimated Creatinine Clearance: 23.5 mL/min (by C-G formula based on SCr of 1.82 mg/dL (H)). Liver Function Tests:  Recent Labs Lab 02/23/17 1848  AST 17  ALT 18  ALKPHOS 64  BILITOT 0.6  PROT 7.4  ALBUMIN 3.7   No results for input(s): LIPASE, AMYLASE in the last 168 hours. No results for input(s): AMMONIA in the last 168 hours. Coagulation Profile: No results for input(s): INR, PROTIME in the last 168 hours. Cardiac Enzymes:  Recent Labs Lab 02/24/17 0631  TROPONINI 0.04*   BNP (last 3 results) No results for input(s): PROBNP in the last 8760 hours. HbA1C: No results for input(s): HGBA1C in the last 72 hours. CBG: No results for input(s): GLUCAP in the last 168 hours. Lipid Profile: No results for input(s): CHOL, HDL, LDLCALC, TRIG, CHOLHDL, LDLDIRECT in the last 72 hours. Thyroid Function Tests: No results for input(s): TSH, T4TOTAL, FREET4, T3FREE, THYROIDAB in the last 72 hours. Anemia Panel: No results for input(s): VITAMINB12, FOLATE, FERRITIN, TIBC, IRON, RETICCTPCT in the last 72 hours. Sepsis Labs:  Recent Labs Lab 02/23/17 1856  LATICACIDVEN 1.13    Recent Results (from the past 240 hour(s))  Culture, Urine     Status: Abnormal (Preliminary result)   Collection Time: 02/23/17  6:42 PM  Result Value Ref Range Status   Specimen Description URINE, RANDOM  Final   Special Requests NONE  Final   Culture (A)  Final    >=100,000 COLONIES/mL GRAM  NEGATIVE RODS IDENTIFICATION AND SUSCEPTIBILITIES TO FOLLOW Performed at [facility], [address], [place], [place] [address]    Report Status PENDING  Incomplete  Culture, blood (routine x 2)     Status: None (Preliminary result)   Collection Time: 02/23/17  7:07 PM  Result Value Ref Range Status   Specimen Description BLOOD RIGHT HAND  Final   Special Requests BOTTLES DRAWN AEROBIC AND ANAEROBIC 5CC  Final   Culture   Final    NO GROWTH 2 DAYS Performed at [facility], [place] [address] [place], [place] [address]    Report Status PENDING  Incomplete  Culture, blood (routine x 2)  Status: None (Preliminary result)   Collection Time: 02/23/17  7:12 PM  Result Value Ref Range Status   Specimen Description BLOOD RIGHT FOREARM  Final   Special Requests BOTTLES DRAWN AEROBIC AND ANAEROBIC 5CC  Final   Culture   Final    NO GROWTH 2 DAYS Performed at [facility], [place] [address], [place], [place] [address]    Report Status PENDING  Incomplete       Radiology Studies: US Renal  Result Date: 02/24/2017 CLINICAL DATA:  Acute kidney injury. Stage 3 chronic kidney disease. EXAM: RENAL / URINARY TRACT ULTRASOUND COMPLETE COMPARISON:  CT scan dated 07/27/2015 FINDINGS: Right Kidney: Length: 9.0 cm. Echogenicity within normal limits. No mass or hydronephrosis visualized. Left Kidney: Length: 8.5 cm. Echogenicity within normal limits. No mass or hydronephrosis visualized. Bladder: Appears normal for degree of bladder distention. IMPRESSION: Normal exam. Electronically Signed   By: Lorriane Shire M.D.   On: 02/24/2017 15:42      Scheduled Meds: . cefTRIAXone (ROCEPHIN)  IV  1 g Intravenous QHS  . clopidogrel  75 mg Oral Daily  . latanoprost  1 drop Both Eyes QHS  . metoprolol succinate  25 mg Oral Daily  . psyllium  1 packet Oral Daily   Continuous Infusions:   LOS: 3 days    Time spent: 30 minutes   Dessa Phi, DO [facility] [email or web address] Password [email or web address] 02/26/2017, 8:08 AM

## 2017-02-26 NOTE — Progress Notes (Signed)
Nutrition Follow-up  DOCUMENTATION CODES:   Non-severe (moderate) malnutrition in context of chronic illness  INTERVENTION:   -Continue Boost Breeze po TID, each supplement provides 250 kcal and 9 grams of protein (Try mixed with Sprite) -Provide Magic cup BID with meals, each supplement provides 290 kcal and 9 grams of protein -Will place order for daily snacks -Encourage PO intakes (5-6 small frequent meals/day) -RD will continue to monitor needs  NUTRITION DIAGNOSIS:   Inadequate oral intake related to poor appetite as evidenced by per patient/family report.  Ongoing but improving.  GOAL:   Patient will meet greater than or equal to 90% of their needs  Progressing.  MONITOR:   PO intake, Supplement acceptance, Labs, Weight trends, I & O's  REASON FOR ASSESSMENT:   Consult Assessment of nutrition requirement/status  ASSESSMENT:   83 y.o. male with medical history significant of CKD, HTN, CAD comes in with over a week of dysuria, urinary retention, urgency , hesitancy and progressive worsening weakness. He was recently evaluated by his PCP and was placed on Macrobid 2 days ago for urinary tract infection. He states that this has not helped much. He continues to complain of dysuria, urinary frequency and urgency. He also reports low-grade fever at home of 100.4. Patient was admitted for further treatment of UTI.  Patient in room with wife at bedside. Pt reports not liking the Boost Breeze supplements by themselves. RD suggested trying supplement mixed with Sprite, pt willing to try this. Pt now on regular diet and wanted confirmation that he can eat whatever he wants. Pt plans to order a hamburger. Encouraged pt to try and eat 5-6 small frequent meals a day. Reviewed protein foods with patient. Pt would like daily snacks provided between meals.  Medications reviewed. Labs reviewed: GFR: 32  Diet Order:  Diet regular Room service appropriate? Yes; Fluid consistency:  Thin  Skin:  Reviewed, no issues  Last BM:  3/7  Height:   Ht Readings from Last 1 Encounters:  02/23/17 5\' 7"  (1.702 m)    Weight:   Wt Readings from Last 1 Encounters:  02/26/17 123 lb 7.3 oz (56 kg)    Ideal Body Weight:  67.3 kg  BMI:  Body mass index is 19.34 kg/m.  Estimated Nutritional Needs:   Kcal:  1550-1750  Protein:  70-80g  Fluid:  1.7L/day  EDUCATION NEEDS:   Education needs addressed  Clayton Bibles, MS, RD, LDN Pager: [phone] After Hours Pager: [phone]

## 2017-02-27 ENCOUNTER — Encounter (HOSPITAL_COMMUNITY): Payer: Self-pay | Admitting: *Deleted

## 2017-02-27 LAB — BASIC METABOLIC PANEL
Anion gap: 8 (ref 5–15)
BUN: 29 mg/dL — AB (ref 6–20)
CHLORIDE: 119 mmol/L — AB (ref 101–111)
CO2: 16 mmol/L — AB (ref 22–32)
CREATININE: 1.9 mg/dL — AB (ref 0.61–1.24)
Calcium: 8.7 mg/dL — ABNORMAL LOW (ref 8.9–10.3)
GFR calc Af Amer: 35 mL/min — ABNORMAL LOW (ref >60)
GFR calc non Af Amer: 31 mL/min — ABNORMAL LOW (ref >60)
Glucose, Bld: 107 mg/dL — ABNORMAL HIGH (ref 65–99)
POTASSIUM: 4.4 mmol/L (ref 3.5–5.1)
Sodium: 143 mmol/L (ref 135–145)

## 2017-02-27 LAB — CBC WITH DIFFERENTIAL/PLATELET
BASOS PCT: 0 %
Basophils Absolute: 0 10*3/uL (ref 0.0–0.1)
EOS PCT: 1 %
Eosinophils Absolute: 0.1 10*3/uL (ref 0.0–0.7)
HEMATOCRIT: 28.5 % — AB (ref 39.0–52.0)
HEMOGLOBIN: 9 g/dL — AB (ref 13.0–17.0)
LYMPHS PCT: 11 %
Lymphs Abs: 1.2 10*3/uL (ref 0.7–4.0)
MCH: 27.4 pg (ref 26.0–34.0)
MCHC: 31.6 g/dL (ref 30.0–36.0)
MCV: 86.9 fL (ref 78.0–100.0)
Monocytes Absolute: 1 10*3/uL (ref 0.1–1.0)
Monocytes Relative: 9 %
NEUTROS ABS: 8.9 10*3/uL — AB (ref 1.7–7.7)
Neutrophils Relative %: 79 %
Platelets: 191 10*3/uL (ref 150–400)
RBC: 3.28 MIL/uL — ABNORMAL LOW (ref 4.22–5.81)
RDW: 17.1 % — ABNORMAL HIGH (ref 11.5–15.5)
WBC: 11.2 10*3/uL — ABNORMAL HIGH (ref 4.0–10.5)

## 2017-02-27 LAB — URINE CULTURE

## 2017-02-27 MED ORDER — HYDRALAZINE HCL 20 MG/ML IJ SOLN
10.0000 mg | Freq: Three times a day (TID) | INTRAMUSCULAR | Status: DC | PRN
  Administered 2017-02-27: 10 mg via INTRAVENOUS
  Filled 2017-02-27: qty 1

## 2017-02-27 MED ORDER — SODIUM CHLORIDE 0.9 % IV SOLN
INTRAVENOUS | Status: DC
  Administered 2017-02-27: 13:00:00 via INTRAVENOUS

## 2017-02-27 MED ORDER — SACCHAROMYCES BOULARDII 250 MG PO CAPS
250.0000 mg | ORAL_CAPSULE | Freq: Two times a day (BID) | ORAL | Status: DC
  Administered 2017-02-27 – 2017-02-28 (×3): 250 mg via ORAL
  Filled 2017-02-27 (×3): qty 1

## 2017-02-27 MED ORDER — LIP MEDEX EX OINT
TOPICAL_OINTMENT | CUTANEOUS | Status: AC
  Administered 2017-02-27: 13:00:00
  Filled 2017-02-27: qty 7

## 2017-02-27 MED ORDER — PHENAZOPYRIDINE HCL 100 MG PO TABS
100.0000 mg | ORAL_TABLET | Freq: Two times a day (BID) | ORAL | Status: DC
  Administered 2017-02-27 – 2017-02-28 (×3): 100 mg via ORAL
  Filled 2017-02-27 (×4): qty 1

## 2017-02-27 MED ORDER — CIPROFLOXACIN HCL 500 MG PO TABS
500.0000 mg | ORAL_TABLET | Freq: Two times a day (BID) | ORAL | Status: DC
  Administered 2017-02-27 – 2017-02-28 (×3): 500 mg via ORAL
  Filled 2017-02-27 (×3): qty 1

## 2017-02-27 NOTE — Progress Notes (Signed)
  Physical Therapy Treatment Patient Details Name: Nicholas Porter MRN: [id number] DOB: August 03, 1930 Today's Date: 02/27/2017    History of Present Illness 83 yo male admitted with UTI, weakness. Hx of CKD, HTN, CAD, anemia.     PT Comments    Progressing with mobility. Improved motivation and mobility on today. Pt is still unsteady at times requiring Min assist intermittently. Recommend RW use for ambulation safety (will need CM to order RW-pt has a standard walker without wheels at home). Continue to recommend daily OOB/ambulation with nursing assistance during hospital stay. Pt stated he will return home at discharge.     Follow Up Recommendations  Home health PT;Supervision/Assistance - 24 hour     Equipment Recommendations  Rolling walker with 5" wheels    Recommendations for Other Services       Precautions / Restrictions Precautions Precautions: Fall Restrictions Weight Bearing Restrictions: No    Mobility  Bed Mobility Overal bed mobility: Modified Independent                Transfers Overall transfer level: Needs assistance Equipment used: Rolling walker (2 wheeled) Transfers: Sit to/from Stand Sit to Stand: Min guard         General transfer comment: close guard for safety. VCs safety.   Ambulation/Gait Ambulation/Gait assistance: Min assist;Min guard Ambulation Distance (Feet): 75 Feet Assistive device: Rolling walker (2 wheeled)       General Gait Details: LOB/unsteady with 1st few steps. Min guard for remainder of distance. dyspnea 2/4. Cues for safety, pacing.   Stairs            Wheelchair Mobility    Modified Rankin (Stroke Patients Only)       Balance             Standing balance-Leahy Scale: Poor                      Cognition Arousal/Alertness: Awake/alert Behavior During Therapy: WFL for tasks assessed/performed Overall Cognitive Status: Within Functional Limits for tasks assessed                       Exercises      General Comments        Pertinent Vitals/Pain Pain Assessment: No/denies pain    Home Living                      Prior Function            PT Goals (current goals can now be found in the care plan section) Progress towards PT goals: Progressing toward goals    Frequency    Min 3X/week      PT Plan Current plan remains appropriate    Co-evaluation             End of Session Equipment Utilized During Treatment: Gait belt Activity Tolerance: Patient tolerated treatment well Patient left: in bed;with call bell/phone within reach;with family/visitor present;with nursing/sitter in room   PT Visit Diagnosis: Difficulty in walking, not elsewhere classified (R26.2);Muscle weakness (generalized) (M62.81)     Time: 9371-6967 PT Time Calculation (min) (ACUTE ONLY): 9 min  Charges:  $Gait Training: 8-22 mins                    G Codes:        Weston Anna, MPT Pager: [phone]

## 2017-02-27 NOTE — Progress Notes (Signed)
PROGRESS NOTE    Nicholas Porter  ZOX:[id number] DOB: 1930/01/14 DOA: 02/23/2017 PCP: Cathlean Cower, MD     Brief Narrative:  Nicholas Porter is a 83 y.o. male with medical history significant of CKD, HTN, CAD comes in with over a week of dysuria, urinary retention, urgency , hesitancy and progressive worsening weakness. He was recently evaluated by his PCP and was placed on Macrobid 2 days ago for urinary tract infection. He states that this has not helped much. He continues to complain of dysuria, urinary frequency and urgency. He also reports low-grade fever at home of 100.4. Patient was admitted for further treatment of UTI.  Assessment & Plan:   Principal Problem:   UTI (urinary tract infection) Active Problems:   Essential hypertension   CAD S/P percutaneous coronary angioplasty - DES in RCA with PTCA for ISR; Moderate mLAD, 80% ostial OM2 stable   Acute renal failure superimposed on stage 4 chronic kidney disease (HCC)   Anemia of chronic renal failure, stage 4 (severe) (HCC)   Metabolic acidosis   Klebsiella Urinary tract infection, present on admission  -seen on repeated culture -Patient with low grade temp overnight; but will like to go home no later than Friday  -Blood culture has remained negative so far -will transition to Ciprofloxacin  -start low dose pyridium for ongoing dysuria -WBC's trending down  Acute kidney injury on chronic kidney disease stage IV -Baseline creatinine of 1.6, improving with IVF  -Renal ultrasound unremarkable -holding nephrotoxic agents -Trend BMP  Essential hypertension -Hold ARB due to acute kidney injury -Continue metoprolol and use PRN hydralazine  CAD -Currently stable, no complaints of CP  -Continue plavix and B-blocker   Chronic anemia of chronic renal failure -Currently stable -no signs of acute bleeding   Diarrhea associated with abx's -will start florastor -rocephin changed to ciprofloxacin now -will monitor    DVT  prophylaxis: SCDs Code Status: Full code Family Communication: Wife at bedside Disposition Plan: Pending further improvement, suspect discharge back home with HHPT  Services in the next 24-48 hours   Consultants:   None  Procedures:   See below for x-ray reports   Antimicrobials:   Rocephin 3/4>>>>3/8  Ciprofloxacin 3/8   Subjective: Patient continue to have poor appetite and endorses diarrhea when eating. Also with dysuria and low grade fever overnight.   Objective: Vitals:   02/26/17 1029 02/26/17 1439 02/26/17 2037 02/27/17 0447  BP: (!) 171/67 (!) 179/61 (!) 172/60 (!) 168/52  Pulse: (!) 58 71 72 (!) 54  Resp:   18 18  Temp:  99.9 F (37.7 C) (!) 101.5 F (38.6 C) 99.1 F (37.3 C)  TempSrc:  Oral Oral Oral  SpO2:  99% 99% 97%  Weight:    56.9 kg (125 lb 7.1 oz)  Height:        Intake/Output Summary (Last 24 hours) at 02/27/17 1221 Last data filed at 02/27/17 0925  Gross per 24 hour  Intake              600 ml  Output              700 ml  Net             -100 ml   Filed Weights   02/25/17 0146 02/26/17 0411 02/27/17 0447  Weight: 55.3 kg (122 lb) 56 kg (123 lb 7.3 oz) 56.9 kg (125 lb 7.1 oz)    Examination:  General exam: Appears calm and comfortable  Respiratory system: Clear to  auscultation. Respiratory effort normal. Cardiovascular system: S1 & S2 heard, RRR. No JVD, murmurs, rubs, gallops or clicks. No pedal edema. Gastrointestinal system: Abdomen is nondistended, soft and nontender. No organomegaly or masses felt. Normal bowel sounds heard. Central nervous system: Alert and oriented. No focal neurological deficits. Extremities: Symmetric 4 x 5 power. Psychiatry: Judgement and insight appear normal. Mood & affect appropriate.   Data Reviewed: I have personally reviewed following labs and imaging studies  CBC:  Recent Labs Lab 02/23/17 1848 02/24/17 0359 02/25/17 0347 02/26/17 0403 02/27/17 0401  WBC 15.5* 17.1* 14.6* 13.1* 11.2*    NEUTROABS 13.2*  --  12.1* 10.8* 8.9*  HGB 9.7* 9.1* 7.7* 8.3* 9.0*  HCT 30.7* 28.8* 24.5* 26.6* 28.5*  MCV 86.0 86.5 87.5 87.8 86.9  PLT 168 163 151 156 939   Basic Metabolic Panel:  Recent Labs Lab 02/23/17 1848 02/24/17 0359 02/25/17 0347 02/26/17 0403 02/27/17 0401  NA 139 141 143 145 143  K 5.4* 4.8 4.7 4.6 4.4  CL 113* 119* 122* 122* 119*  CO2 18* 16* 15* 16* 16*  GLUCOSE 130* 116* 106* 99 107*  BUN 48* 43* 31* 26* 29*  CREATININE 2.23* 2.07* 2.00* 1.82* 1.90*  CALCIUM 9.2 8.7* 8.4* 8.7* 8.7*   GFR: Estimated Creatinine Clearance: 22.9 mL/min (by C-G formula based on SCr of 1.9 mg/dL (H)). Liver Function Tests:  Recent Labs Lab 02/23/17 1848  AST 17  ALT 18  ALKPHOS 64  BILITOT 0.6  PROT 7.4  ALBUMIN 3.7   No results for input(s): LIPASE, AMYLASE in the last 168 hours. No results for input(s): AMMONIA in the last 168 hours. Coagulation Profile: No results for input(s): INR, PROTIME in the last 168 hours. Cardiac Enzymes:  Recent Labs Lab 02/24/17 0631  TROPONINI 0.04*   Sepsis Labs:  Recent Labs Lab 02/23/17 1856  LATICACIDVEN 1.13    Recent Results (from the past 240 hour(s))  Culture, Urine     Status: Abnormal   Collection Time: 02/23/17  6:42 PM  Result Value Ref Range Status   Specimen Description URINE, RANDOM  Final   Special Requests NONE  Final   Culture >=100,000 COLONIES/mL KLEBSIELLA OXYTOCA (A)  Final   Report Status 02/27/2017 FINAL  Final   Organism ID, Bacteria KLEBSIELLA OXYTOCA (A)  Final      Susceptibility   Klebsiella oxytoca - MIC*    AMPICILLIN >=32 RESISTANT Resistant     CEFAZOLIN 8 SENSITIVE Sensitive     CEFTRIAXONE <=1 SENSITIVE Sensitive     CIPROFLOXACIN <=0.25 SENSITIVE Sensitive     GENTAMICIN <=1 SENSITIVE Sensitive     IMIPENEM <=0.25 SENSITIVE Sensitive     NITROFURANTOIN 128 RESISTANT Resistant     TRIMETH/SULFA <=20 SENSITIVE Sensitive     AMPICILLIN/SULBACTAM 16 INTERMEDIATE Intermediate      PIP/TAZO <=4 SENSITIVE Sensitive     Extended ESBL NEGATIVE Sensitive     * >=100,000 COLONIES/mL KLEBSIELLA OXYTOCA  Culture, blood (routine x 2)     Status: None (Preliminary result)   Collection Time: 02/23/17  7:07 PM  Result Value Ref Range Status   Specimen Description BLOOD RIGHT HAND  Final   Special Requests BOTTLES DRAWN AEROBIC AND ANAEROBIC 5CC  Final   Culture   Final    NO GROWTH 3 DAYS Performed at [facility], [address], [place], [place] [address]    Report Status PENDING  Incomplete  Culture, blood (routine x 2)     Status: None (Preliminary  result)   Collection Time: 02/23/17  7:12 PM  Result Value Ref Range Status   Specimen Description BLOOD RIGHT FOREARM  Final   Special Requests BOTTLES DRAWN AEROBIC AND ANAEROBIC 5CC  Final   Culture   Final    NO GROWTH 3 DAYS Performed at [facility], [place] [address], [place], [place] [address]    Report Status PENDING  Incomplete      Radiology Studies: No results found.   Scheduled Meds: . cefTRIAXone (ROCEPHIN)  IV  1 g Intravenous QHS  . clopidogrel  75 mg Oral Daily  . feeding supplement  1 Container Oral TID BM  . latanoprost  1 drop Both Eyes QHS  . metoprolol succinate  25 mg Oral Daily  . phenazopyridine  100 mg Oral BID  . saccharomyces boulardii  250 mg Oral BID   Continuous Infusions: . sodium chloride       LOS: 4 days    Time spent: 30 minutes   Barton Dubois, MD [facility]  [phone]  02/27/2017, 12:21 PM

## 2017-02-27 NOTE — Care Management Note (Signed)
Case Management Note  Patient Details  Name: Nicholas Porter MRN: [id number] Date of Birth: 1930/06/22  Subjective/Objective:       83 yo admitted with UTI.             Action/Plan: Pt from home with wife. Wife works and pt is alone at times. Pt politely declines home health services at this time and states he has all the equipment he needs. Home health provider list left with pt and he was encouraged to inform staff if he changes his mind. No other CM needs communicated.  Expected Discharge Date:  02/26/17               Expected Discharge Plan:  Home/Self Care  In-House Referral:     Discharge planning Services  CM Consult  Post Acute Care Choice:  Home Health Choice offered to:  Patient  DME Arranged:    DME Agency:     HH Arranged:    [place] Agency:     Status of Service:  Completed, signed off  If discussed at [employer], dates discussed:    Additional CommentsLynnell Catalan, RN 02/27/2017, 10:18 AM  [phone]

## 2017-02-28 ENCOUNTER — Encounter (HOSPITAL_COMMUNITY): Payer: Self-pay

## 2017-02-28 DIAGNOSIS — R531 Weakness: Secondary | ICD-10-CM

## 2017-02-28 LAB — CBC WITH DIFFERENTIAL/PLATELET
BASOS ABS: 0 10*3/uL (ref 0.0–0.1)
BASOS PCT: 0 %
EOS ABS: 0 10*3/uL (ref 0.0–0.7)
Eosinophils Relative: 0 %
HEMATOCRIT: 26 % — AB (ref 39.0–52.0)
HEMOGLOBIN: 8.3 g/dL — AB (ref 13.0–17.0)
Lymphocytes Relative: 12 %
Lymphs Abs: 1.2 10*3/uL (ref 0.7–4.0)
MCH: 27.3 pg (ref 26.0–34.0)
MCHC: 31.9 g/dL (ref 30.0–36.0)
MCV: 85.5 fL (ref 78.0–100.0)
MONOS PCT: 11 %
Monocytes Absolute: 1.1 10*3/uL — ABNORMAL HIGH (ref 0.1–1.0)
NEUTROS ABS: 7.5 10*3/uL (ref 1.7–7.7)
NEUTROS PCT: 77 %
Platelets: 173 10*3/uL (ref 150–400)
RBC: 3.04 MIL/uL — ABNORMAL LOW (ref 4.22–5.81)
RDW: 16.9 % — AB (ref 11.5–15.5)
WBC: 9.7 10*3/uL (ref 4.0–10.5)

## 2017-02-28 LAB — BASIC METABOLIC PANEL
ANION GAP: 5 (ref 5–15)
BUN: 29 mg/dL — ABNORMAL HIGH (ref 6–20)
CALCIUM: 8.5 mg/dL — AB (ref 8.9–10.3)
CHLORIDE: 121 mmol/L — AB (ref 101–111)
CO2: 18 mmol/L — ABNORMAL LOW (ref 22–32)
CREATININE: 1.8 mg/dL — AB (ref 0.61–1.24)
GFR calc non Af Amer: 33 mL/min — ABNORMAL LOW (ref >60)
GFR, EST AFRICAN AMERICAN: 38 mL/min — AB (ref >60)
Glucose, Bld: 111 mg/dL — ABNORMAL HIGH (ref 65–99)
Potassium: 4.3 mmol/L (ref 3.5–5.1)
SODIUM: 144 mmol/L (ref 135–145)

## 2017-02-28 LAB — CULTURE, BLOOD (ROUTINE X 2)
CULTURE: NO GROWTH
Culture: NO GROWTH

## 2017-02-28 MED ORDER — SACCHAROMYCES BOULARDII 250 MG PO CAPS: 250.0000 mg | ORAL_CAPSULE | Freq: Two times a day (BID) | ORAL | 0 refills | Status: DC

## 2017-02-28 MED ORDER — PHENAZOPYRIDINE HCL 100 MG PO TABS: 100.0000 mg | ORAL_TABLET | Freq: Two times a day (BID) | ORAL | 0 refills | Status: DC

## 2017-02-28 MED ORDER — CIPROFLOXACIN HCL 500 MG PO TABS: 500.0000 mg | ORAL_TABLET | Freq: Two times a day (BID) | ORAL | 0 refills | Status: DC

## 2017-02-28 MED ORDER — BOOST / RESOURCE BREEZE PO LIQD: 1.0000 | Freq: Three times a day (TID) | ORAL | Status: DC

## 2017-02-28 MED ORDER — METOPROLOL SUCCINATE ER 25 MG PO TB24: 37.5000 mg | ORAL_TABLET | Freq: Every day | ORAL | 1 refills | Status: DC

## 2017-02-28 MED ORDER — LOSARTAN POTASSIUM 50 MG PO TABS: 50.0000 mg | ORAL_TABLET | Freq: Every day | ORAL | Status: DC

## 2017-02-28 NOTE — Discharge Summary (Signed)
Physician Discharge Summary  Nicholas Porter NTZ:[id number] DOB: 1930-07-22 DOA: 02/23/2017  PCP: Cathlean Cower, MD  Admit date: 02/23/2017 Discharge date: 02/28/2017  Time spent: 35 minutes  Recommendations for Outpatient Follow-up:  Repeat BMET to follow electrolytes and renal function Reassess BP and restart/adjust antihypertensive regimen as needed. Repeat CBC to follow Hgb trend  Discharge Diagnoses:  Principal Problem:   UTI (urinary tract infection) Active Problems:   Essential hypertension   CAD S/P percutaneous coronary angioplasty - DES in RCA with PTCA for ISR; Moderate mLAD, 80% ostial OM2 stable   Acute renal failure superimposed on stage 4 chronic kidney disease (HCC)   Anemia of chronic renal failure, stage 4 (severe) (HCC)   Metabolic acidosis   Weakness physical deconditioning   Discharge Condition: stable and improved. Discharge home with home health services and instructions to follow up with PCP in 10 days.  Diet recommendation: heart healthy and low sodium diet  Filed Weights   02/26/17 0411 02/27/17 0447 02/28/17 0458  Weight: 56 kg (123 lb 7.3 oz) 56.9 kg (125 lb 7.1 oz) 60.5 kg (133 lb 4.8 oz)    History of present illness:  Nicholas Porter a 83 y.o.malewith medical history significant of CKD, HTN, CAD comes in with over a week of dysuria, urinary retention, urgency , hesitancy and progressive worsening weakness. He was recently evaluated by his PCP and was placed on Macrobid 2 days ago for urinary tract infection. He states that this has not helped much. He continues to complain of dysuria, urinary frequency and urgency. He also reports low-grade fever at home of 100.4. Patient was admitted for further treatment of UTI.  Hospital Course:  Klebsiella Urinary tract infection, present on admission  -seen on repeated culture (sensitive to rocephin and ciprofloxacin) -treated with IV rocephin initially  -Patient afebrile for 24 hours prior to discharge -Blood  culture has remained negative so far -transition to Ciprofloxacin to complete antibiotic therapy -discharge on low dose pyridium for ongoing dysuria -WBC's WNL at discharge  Acute kidney injury on chronic kidney disease stage III-IV -Baseline creatinine of 1.6, improved with IVF and treatment of UTI -Renal ultrasound unremarkable -holding nephrotoxic agents -Trend BMP as an outpatient   Essential hypertension -Holding ARB due to acute kidney injury -Continue metoprolol (1.5 tablets for now) and instructed to follow low sodium diet  CAD -Currently stable, no complaints of CP  -Continue plavix and B-blocker   Chronic anemia of chronic renal failure -Currently stable -no signs of acute bleeding -repeat CBC as an outpatient to follow trend   Diarrhea associated with abx's -will discharge on florastor -patient advise to maintain good hydration. -antibiotics changed from rocephin to ciprofloxacin -stool was more form at discharge  Physical deconditioning  -PT evaluated patient and recommended [facility] services.  Procedures:  See below for x-ray reports   Consultations:  None   Discharge Exam: Vitals:   02/27/17 2039 02/28/17 0458  BP: (!) 165/59 (!) 158/63  Pulse: 70 68  Resp: 16 14  Temp: 98.9 F (37.2 C) 98.6 F (37 C)   General exam: Appears calm and comfortable; afebrile. denies CP and SOB. No nausea, no vomiting. Respiratory system: Clear to auscultation. Respiratory effort normal. Cardiovascular system: S1 & S2 heard, RRR. No JVD, murmurs, rubs, gallops or clicks. No pedal edema. Gastrointestinal system: Abdomen is nondistended, soft and nontender. No organomegaly or masses felt. Normal bowel sounds heard. Central nervous system: Alert and oriented. No focal neurological deficits. Extremities: Symmetric 4 x 5 power bilaterally simetrically .  Psychiatry: Judgement and insight appear normal. Mood & affect appropriate.    Discharge Instructions   Discharge  Instructions    Discharge instructions    Complete by:  As directed    [facility] PCP IN 10 DAYS FOLLOW LOW SODIUM/HEART HEALTHY DIET HOLD YOUR COZAAR (BLOOD PRESSURE MEDICATION) UNTIL FOLLOW UP WITH PCP [facility] FOR IMPROVEMENT IN CONDITIONING AND TO HELP REGAINING YOUR STRENGTH     Current Discharge Medication List    START taking these medications   Details  ciprofloxacin (CIPRO) 500 MG tablet Take 1 tablet (500 mg total) by mouth 2 (two) times daily. Qty: 12 tablet, Refills: 0    feeding supplement (BOOST / RESOURCE BREEZE) LIQD Take 1 Container by mouth 3 (three) times daily between meals.    phenazopyridine (PYRIDIUM) 100 MG tablet Take 1 tablet (100 mg total) by mouth 2 (two) times daily. Qty: 8 tablet, Refills: 0    saccharomyces boulardii (FLORASTOR) 250 MG capsule Take 1 capsule (250 mg total) by mouth 2 (two) times daily. Qty: 20 capsule, Refills: 0      CONTINUE these medications which have CHANGED   Details  losartan (COZAAR) 50 MG tablet Take 1 tablet (50 mg total) by mouth daily. HOLD UNTIL FOLLOW UP WITH PRIMARY CARE DOCTOR    metoprolol succinate (TOPROL-XL) 25 MG 24 hr tablet Take 1.5 tablets (37.5 mg total) by mouth daily. Qty: 45 tablet, Refills: 1      CONTINUE these medications which have NOT CHANGED   Details  brinzolamide (AZOPT) 1 % ophthalmic suspension Place 1 drop into the left eye every 12 (twelve) hours.     clopidogrel (PLAVIX) 75 MG tablet Take 1 tablet (75 mg total) by mouth daily. Qty: 90 tablet, Refills: 0    nitroGLYCERIN (NITROSTAT) 0.4 MG SL tablet Place 1 tablet (0.4 mg total) under the tongue every 5 (five) minutes as needed. For chest pain. Qty: 25 tablet, Refills: 6    TRAVATAN Z 0.004 % SOLN ophthalmic solution Place 1 drop into both eyes at bedtime.        Allergies  Allergen Reactions  . Amlodipine Besylate Hives   . Hydralazine Itching  . Lipitor [Atorvastatin] Itching  . Naproxen Diarrhea  . Oxycodone-Acetaminophen Itching   Follow-up Information    Cathlean Cower, MD. Schedule an appointment as soon as possible for a visit in 10 day(s).   Specialties:  Internal Medicine, Radiology Contact information: [address] [place] [address] [phone]            The results of significant diagnostics from this hospitalization (including imaging, microbiology, ancillary and laboratory) are listed below for reference.    Significant Diagnostic Studies: Dg Chest 2 View  Result Date: 02/23/2017 CLINICAL DATA:  Recent cough, congestion and sore throat. Cold sores. Recent diagnosis of UTI. Chronic shortness of breath due to heart attack 3 years ago. EXAM: CHEST  2 VIEW COMPARISON:  Chest x-ray dated 07/29/2015. FINDINGS: Heart size and mediastinal contours are normal. Atherosclerotic changes again noted at the aortic arch. Lungs are hyperexpanded. Suspect associated chronic bronchitic changes centrally. Lungs otherwise clear. No pleural effusion or pneumothorax seen. Degenerative spurring again noted throughout the slightly scoliotic thoracic spine. No acute or suspicious osseous finding. IMPRESSION: 1. Hyperexpanded lungs suggesting COPD. Suspect associated chronic bronchitic changes centrally. 2. No acute findings.  No evidence of pneumonia or pulmonary edema. 3. Aortic  atherosclerosis. Electronically Signed   By: Franki Cabot M.D.   On: 02/23/2017 19:47   US Renal  Result Date: 02/24/2017 CLINICAL DATA:  Acute kidney injury. Stage 3 chronic kidney disease. EXAM: RENAL / URINARY TRACT ULTRASOUND COMPLETE COMPARISON:  CT scan dated 07/27/2015 FINDINGS: Right Kidney: Length: 9.0 cm. Echogenicity within normal limits. No mass or hydronephrosis visualized. Left Kidney: Length: 8.5 cm. Echogenicity within normal limits. No mass or hydronephrosis visualized. Bladder: Appears normal for degree of bladder  distention. IMPRESSION: Normal exam. Electronically Signed   By: Lorriane Shire M.D.   On: 02/24/2017 15:42    Microbiology: Recent Results (from the past 240 hour(s))  Culture, Urine     Status: Abnormal   Collection Time: 02/23/17  6:42 PM  Result Value Ref Range Status   Specimen Description URINE, RANDOM  Final   Special Requests NONE  Final   Culture >=100,000 COLONIES/mL KLEBSIELLA OXYTOCA (A)  Final   Report Status 02/27/2017 FINAL  Final   Organism ID, Bacteria KLEBSIELLA OXYTOCA (A)  Final      Susceptibility   Klebsiella oxytoca - MIC*    AMPICILLIN >=32 RESISTANT Resistant     CEFAZOLIN 8 SENSITIVE Sensitive     CEFTRIAXONE <=1 SENSITIVE Sensitive     CIPROFLOXACIN <=0.25 SENSITIVE Sensitive     GENTAMICIN <=1 SENSITIVE Sensitive     IMIPENEM <=0.25 SENSITIVE Sensitive     NITROFURANTOIN 128 RESISTANT Resistant     TRIMETH/SULFA <=20 SENSITIVE Sensitive     AMPICILLIN/SULBACTAM 16 INTERMEDIATE Intermediate     PIP/TAZO <=4 SENSITIVE Sensitive     Extended ESBL NEGATIVE Sensitive     * >=100,000 COLONIES/mL KLEBSIELLA OXYTOCA  Culture, blood (routine x 2)     Status: None (Preliminary result)   Collection Time: 02/23/17  7:07 PM  Result Value Ref Range Status   Specimen Description BLOOD RIGHT HAND  Final   Special Requests BOTTLES DRAWN AEROBIC AND ANAEROBIC 5CC  Final   Culture   Final    NO GROWTH 4 DAYS Performed at [facility], [address] [place], [place] [address]    Report Status PENDING  Incomplete  Culture, blood (routine x 2)     Status: None (Preliminary result)   Collection Time: 02/23/17  7:12 PM  Result Value Ref Range Status   Specimen Description BLOOD RIGHT FOREARM  Final   Special Requests BOTTLES DRAWN AEROBIC AND ANAEROBIC 5CC  Final   Culture   Final    NO GROWTH 4 DAYS Performed at [facility], [place] [address] [place], [place] [address]    Report Status PENDING  Incomplete     Labs: Basic Metabolic Panel:  Recent  Labs Lab 02/24/17 0359 02/25/17 0347 02/26/17 0403 02/27/17 0401 02/28/17 0421  NA 141 143 145 143 144  K 4.8 4.7 4.6 4.4 4.3  CL 119* 122* 122* 119* 121*  CO2 16* 15* 16* 16* 18*  GLUCOSE 116* 106* 99 107* 111*  BUN 43* 31* 26* 29* 29*  CREATININE 2.07* 2.00* 1.82* 1.90* 1.80*  CALCIUM 8.7* 8.4* 8.7* 8.7* 8.5*   Liver Function Tests:  Recent Labs Lab 02/23/17 1848  AST 17  ALT 18  ALKPHOS 64  BILITOT 0.6  PROT 7.4  ALBUMIN 3.7   No results for input(s): LIPASE, AMYLASE in the last 168 hours. No results for input(s): AMMONIA in the last 168 hours. CBC:  Recent Labs Lab 02/23/17 1848 02/24/17 0359 02/25/17 0347 02/26/17 0403 02/27/17 0401 02/28/17  0421  WBC 15.5* 17.1* 14.6* 13.1* 11.2* 9.7  NEUTROABS 13.2*  --  12.1* 10.8* 8.9* 7.5  HGB 9.7* 9.1* 7.7* 8.3* 9.0* 8.3*  HCT 30.7* 28.8* 24.5* 26.6* 28.5* 26.0*  MCV 86.0 86.5 87.5 87.8 86.9 85.5  PLT 168 163 151 156 191 173   Cardiac Enzymes:  Recent Labs Lab 02/24/17 0631  TROPONINI 0.04*   BNP: BNP (last 3 results) No results for input(s): BNP in the last 8760 hours.  ProBNP (last 3 results) No results for input(s): PROBNP in the last 8760 hours.  CBG: No results for input(s): GLUCAP in the last 168 hours.     Signed:  Barton Dubois MD.  [facility] 02/28/2017, 8:28 AM

## 2017-03-05 ENCOUNTER — Ambulatory Visit: Payer: Medicare Other | Admitting: Cardiology

## 2017-03-11 ENCOUNTER — Encounter: Payer: Self-pay | Admitting: Internal Medicine

## 2017-03-11 ENCOUNTER — Ambulatory Visit (INDEPENDENT_AMBULATORY_CARE_PROVIDER_SITE_OTHER): Payer: Medicare Other | Admitting: Internal Medicine

## 2017-03-11 ENCOUNTER — Other Ambulatory Visit: Payer: Self-pay | Admitting: Internal Medicine

## 2017-03-11 ENCOUNTER — Other Ambulatory Visit (INDEPENDENT_AMBULATORY_CARE_PROVIDER_SITE_OTHER): Payer: Medicare Other

## 2017-03-11 VITALS — BP 124/60 | HR 73 | Temp 98.0°F | Ht 67.0 in | Wt 179.0 lb

## 2017-03-11 DIAGNOSIS — N400 Enlarged prostate without lower urinary tract symptoms: Secondary | ICD-10-CM

## 2017-03-11 DIAGNOSIS — E538 Deficiency of other specified B group vitamins: Secondary | ICD-10-CM | POA: Diagnosis not present

## 2017-03-11 DIAGNOSIS — Z9861 Coronary angioplasty status: Secondary | ICD-10-CM | POA: Diagnosis not present

## 2017-03-11 DIAGNOSIS — N184 Chronic kidney disease, stage 4 (severe): Secondary | ICD-10-CM | POA: Diagnosis not present

## 2017-03-11 DIAGNOSIS — D509 Iron deficiency anemia, unspecified: Secondary | ICD-10-CM

## 2017-03-11 DIAGNOSIS — I2510 Atherosclerotic heart disease of native coronary artery without angina pectoris: Secondary | ICD-10-CM

## 2017-03-11 DIAGNOSIS — N3000 Acute cystitis without hematuria: Secondary | ICD-10-CM | POA: Diagnosis not present

## 2017-03-11 DIAGNOSIS — I10 Essential (primary) hypertension: Secondary | ICD-10-CM

## 2017-03-11 DIAGNOSIS — D631 Anemia in chronic kidney disease: Secondary | ICD-10-CM

## 2017-03-11 DIAGNOSIS — N183 Chronic kidney disease, stage 3 unspecified: Secondary | ICD-10-CM

## 2017-03-11 LAB — URINALYSIS, ROUTINE W REFLEX MICROSCOPIC
Bilirubin Urine: NEGATIVE
Hgb urine dipstick: NEGATIVE
Ketones, ur: NEGATIVE
Nitrite: NEGATIVE
Specific Gravity, Urine: 1.02 (ref 1.000–1.030)
Total Protein, Urine: NEGATIVE
Urine Glucose: NEGATIVE
Urobilinogen, UA: 0.2 (ref 0.0–1.0)
pH: 5.5 (ref 5.0–8.0)

## 2017-03-11 LAB — CBC WITH DIFFERENTIAL/PLATELET
BASOS ABS: 0 10*3/uL (ref 0.0–0.1)
BASOS PCT: 0.6 % (ref 0.0–3.0)
EOS ABS: 0.2 10*3/uL (ref 0.0–0.7)
Eosinophils Relative: 2.7 % (ref 0.0–5.0)
LYMPHS PCT: 26.7 % (ref 12.0–46.0)
Lymphs Abs: 1.7 10*3/uL (ref 0.7–4.0)
MCHC: 32.2 g/dL (ref 30.0–36.0)
MCV: 85.8 fl (ref 78.0–100.0)
Monocytes Absolute: 0.6 10*3/uL (ref 0.1–1.0)
Monocytes Relative: 9.9 % (ref 3.0–12.0)
NEUTROS PCT: 60.1 % (ref 43.0–77.0)
Neutro Abs: 3.8 10*3/uL (ref 1.4–7.7)
PLATELETS: 183 10*3/uL (ref 150.0–400.0)
RBC: 3.08 Mil/uL — ABNORMAL LOW (ref 4.22–5.81)
RDW: 18.9 % — AB (ref 11.5–15.5)
WBC: 6.4 10*3/uL (ref 4.0–10.5)

## 2017-03-11 LAB — BASIC METABOLIC PANEL
BUN: 33 mg/dL — ABNORMAL HIGH (ref 6–23)
CALCIUM: 8.9 mg/dL (ref 8.4–10.5)
CO2: 21 mEq/L (ref 19–32)
CREATININE: 1.81 mg/dL — AB (ref 0.40–1.50)
Chloride: 113 mEq/L — ABNORMAL HIGH (ref 96–112)
GFR: 38.05 mL/min — AB (ref >60.00)
Glucose, Bld: 89 mg/dL (ref 70–99)
Potassium: 4.4 mEq/L (ref 3.5–5.1)
Sodium: 140 mEq/L (ref 135–145)

## 2017-03-11 LAB — VITAMIN B12: Vitamin B-12: 277 pg/mL (ref 211–911)

## 2017-03-11 LAB — IBC PANEL
Iron: 21 ug/dL — ABNORMAL LOW (ref 42–165)
Saturation Ratios: 5.1 % — ABNORMAL LOW (ref 20.0–50.0)
Transferrin: 295 mg/dL (ref 212.0–360.0)

## 2017-03-11 MED ORDER — METOPROLOL SUCCINATE ER 25 MG PO TB24: 37.5000 mg | ORAL_TABLET | Freq: Every day | ORAL | 5 refills | Status: DC

## 2017-03-11 NOTE — Progress Notes (Signed)
Pre visit review using our clinic review tool, if applicable. No additional management support is needed unless otherwise documented below in the visit note. 

## 2017-03-11 NOTE — Patient Instructions (Addendum)
OK to stay off the losartan for now  OK to continue the metoprolol at 1.5 pills per day  Please continue all other medications as before, and refills have been done if requested.  Please have the pharmacy call with any other refills you may need.  Please continue your efforts at being more active, low cholesterol diet, and weight control.  You are otherwise up to date with prevention measures today.  Please keep your appointments with your specialists as you may have planned - cardiology on mar 22  You will be contacted regarding the referral for: urology  Please go to the LAB in the Basement (turn left off the elevator) for the tests to be done today  Depending on your blood tests, we may need to refer you to Kidney specialists  You will be contacted by phone if any changes need to be made immediately.  Otherwise, you will receive a letter about your results with an explanation, but please check with MyChart first.  Please remember to sign up for MyChart if you have not done so, as this will be important to you in the future with finding out test results, communicating by private email, and scheduling acute appointments online when needed.  Please return in 4 months, or sooner if needed

## 2017-03-11 NOTE — Progress Notes (Signed)
Subjective:    Patient ID: Nicholas Porter, male    DOB: 1930/04/10, 83 y.o.   MRN: [id number]  HPI  Here to f/u recent hospn 3/4 - 3/9 with UTI, HTN, CAD, AKI, Anemia, Metabolic acidosis, and general weakness/debility, after EMS had to go to his home twice.  Denies urinary symptoms such as dysuria, frequency, urgency, flank pain, hematuria or n/v, fever, chills, though only urinates every 3 hrs now, with 2-3 times nocturia.  Pt denies chest pain, increased sob or doe, wheezing, orthopnea, PND, increased LE swelling, palpitations, dizziness or syncope.  Pt denies new neurological symptoms such as new headache, or facial or extremity weakness or numbness   Pt denies polydipsia, polyuria, Denies worsening reflux, abd pain, dysphagia, n/v, bowel change or blood, except did have 1 wk fo diarrhea now resolved.  Denies significant prostatism though does have nocturia x 2-3 times. Past Medical History:  Diagnosis Date  . Anemia of chronic renal failure, stage 4 (severe) ([place]) 11/27/2015  . 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 CAD S/P percutaneous coronary angioplasty - DES in RCA with PTCA for ISR; Moderate mLAD, 80% ostial OM2 stable 05/22/2008   Qualifier: Diagnosis of  By: Linda Hedges MD, Heinz Knuckles   . CKD (chronic kidney disease) stage 3, GFR 30-59 ml/min   . Essential hypertension 06/30/2007   Qualifier: Diagnosis of  By: Cori Razor RN, Mikal Plane    . H/O Inferior STEMI (09/2013) emergent PCI with Promus DES to RCA (3.0 mm  x 28 mm - 3.2 mm) 10/05/2013  . Left-sided carotid artery disease -- s/p CEA 04/06/2006   S/p Left CEA   . Mitral regurgitation - onexam 09/03/2016   Past Surgical History:  Procedure Laterality Date  . AMPUTATION     left index finger -tramatic  . CARDIAC CATHETERIZATION  November 2011   40% mid LAD, 50% proximal RCA, 30-40% distal RCA (DOMINANT RCA with wraparound PDA & 2 large PLBs), 60-70% ostial  OM1. No change from 2001. Medical therapy  . CAROTID ENDARTERECTOMY Left 2007   Dr.Hayes: 08/2016 Dopplers: Stable less than 40% right internal carotid stenosis and stable moderate 40-60% left internal carotid stenosis. Patent vertebral and subclavian arteries.  . CARPAL TUNNEL RELEASE    . CORNEAL TRANSPLANT    . CORONARY ANGIOPLASTY WITH STENT PLACEMENT  09/2013; 05/2014    d) Promus DES 3.0 mm x 28 mm (3.2 mm)  to RCA with STEMI; residual 70% mid-distal LAD, OM 270-80%. Medical management; b) PTCA of 75 % ISR , LAD lesion noted as ~40%  . FOOT SURGERY     left  . HERNIA REPAIR    . LEFT HEART CATHETERIZATION WITH CORONARY ANGIOGRAM N/A 10/05/2013   Procedure: LEFT HEART CATHETERIZATION WITH CORONARY ANGIOGRAM;  Surgeon: Peter M Martinique, MD;  Location: [facility] CATH LAB;  Service: Cardiovascular;  RCA 100% - PCI,CxOM stable, LAD ~70% mid;  . LEFT HEART CATHETERIZATION WITH CORONARY ANGIOGRAM N/A 06/08/2014   Procedure: LEFT HEART CATHETERIZATION WITH CORONARY ANGIOGRAM;  Surgeon: Troy Sine, MD;  Location: [facility] CATH LAB;  Service: Cardiovascular;  UA 6/'15: 75% ISR in RCA - PTCA, LAD lesion ~40%, CxOM stable.    Marland Kitchen NM MYOVIEW LTD  11/2013; 08/1014   a) 12/'14: EF 59%, Fixed Inferior-Inferoapical Defect c/w Inferior Scar; No Ischemia; b) 9/'15:  9/'15: EF 55% - persistent fixed inferior-inferoapical perfusion defect  - read as diaphragmatic attenuation, likely consistent with  infarct/scar   . PARTIAL GASTRECTOMY    . PERCUTANEOUS CORONARY INTERVENTION-BALLOON ONLY  06/08/2014   Procedure: PERCUTANEOUS CORONARY INTERVENTION-BALLOON ONLY;  Surgeon: Troy Sine, MD;  Location: [facility] CATH LAB;  Service: Cardiovascular;;  . ROTATOR CUFF REPAIR     left  . SHOULDER ARTHROSCOPY WITH ROTATOR CUFF REPAIR AND SUBACROMIAL DECOMPRESSION Right 07/01/2013   Procedure: RIGHT SHOULDER ARTHROSCOPY WITH  SUBACROMIAL DECOMPRESSION/DISTAL CLAVICLE RESECTION/ROTATOR CUFF REPAIR ;  Surgeon: Marin Shutter, MD;  Location: [place];  Service: Orthopedics;  Laterality: Right;  . SPINE SURGERY  1985   cervical laminectomy  . TOTAL KNEE ARTHROPLASTY    . TRANSTHORACIC ECHOCARDIOGRAM  08/2016   Normal EF 55-60%. Mild to moderate aortic stenosis with moderate regurgitation. Mild MR.  . VAGOTOMY     partial gastrectomy    reports that he quit smoking about 52 years ago. His smoking use included Cigarettes. He has never used smokeless tobacco. He reports that he does not drink alcohol or use drugs. family history includes Diabetes in his sister; Heart disease in his sister. Allergies  Allergen Reactions  . Amlodipine Besylate Hives  . Hydralazine Itching  . Lipitor [Atorvastatin] Itching  . Naproxen Diarrhea  . Oxycodone-Acetaminophen Itching   Current Outpatient Prescriptions on File Prior to Visit  Medication Sig Dispense Refill  . brinzolamide (AZOPT) 1 % ophthalmic suspension Place 1 drop into the left eye every 12 (twelve) hours.     . ciprofloxacin (CIPRO) 500 MG tablet Take 1 tablet (500 mg total) by mouth 2 (two) times daily. 12 tablet 0  . clopidogrel (PLAVIX) 75 MG tablet Take 1 tablet (75 mg total) by mouth daily. 90 tablet 0  . feeding supplement (BOOST / RESOURCE BREEZE) LIQD Take 1 Container by mouth 3 (three) times daily between meals.    . nitroGLYCERIN (NITROSTAT) 0.4 MG SL tablet Place 1 tablet (0.4 mg total) under the tongue every 5 (five) minutes as needed. For chest pain. 25 tablet 6  . phenazopyridine (PYRIDIUM) 100 MG tablet Take 1 tablet (100 mg total) by mouth 2 (two) times daily. 8 tablet 0  . saccharomyces boulardii (FLORASTOR) 250 MG capsule Take 1 capsule (250 mg total) by mouth 2 (two) times daily. 20 capsule 0  . TRAVATAN Z 0.004 % SOLN ophthalmic solution Place 1 drop into both eyes at bedtime.     Marland Kitchen losartan (COZAAR) 50 MG tablet Take 1 tablet (50 mg total) by mouth daily. HOLD UNTIL FOLLOW UP WITH PRIMARY CARE DOCTOR     No current facility-administered medications on file prior to  visit.    Review of Systems  Constitutional: Negative for unusual diaphoresis or night sweats HENT: Negative for ear swelling or discharge Eyes: Negative for worsening visual haziness  Respiratory: Negative for choking and stridor.   Gastrointestinal: Negative for distension or worsening eructation Genitourinary: Negative for retention or change in urine volume.  Musculoskeletal: Negative for other MSK pain or swelling Skin: Negative for color change and worsening wound Neurological: Negative for tremors and numbness other than noted  Psychiatric/Behavioral: Negative for decreased concentration or agitation other than above   All other system neg per pt    Objective:   Physical Exam BP 124/60   Pulse 73   Temp 98 F (36.7 C) (Oral)   Ht 5\' 7"  (1.702 m)   Wt 179 lb (81.2 kg)   SpO2 96%   BMI 28.04 kg/m  VS noted, not ill appearing Constitutional: Pt appears in no apparent distress HENT:  Head: NCAT.  Right Ear: External ear normal.  Left Ear: External ear normal.  Eyes: . Pupils are equal, round, and reactive to light. Conjunctivae and EOM are normal Neck: Normal range of motion. Neck supple.  Cardiovascular: Normal rate and regular rhythm.   Pulmonary/Chest: Effort normal and breath sounds without rales or wheezing.  Abd:  Soft, NT, ND, + BS Neurological: Pt is alert. Not confused , motor grossly intact Skin: Skin is warm. No rash, no LE edema Psychiatric: Pt behavior is normal. No agitation.  No other exam findings    Assessment & Plan:

## 2017-03-12 ENCOUNTER — Telehealth: Payer: Self-pay

## 2017-03-12 NOTE — Telephone Encounter (Signed)
-----   Message from Biagio Borg, MD sent at 03/11/2017  8:43 PM EDT ----- Letter sent, cont same tx except  The test results show that your current treatment is OK, except the Kidney function is still low, and we also found that you have low iron, which likely contributes to the anemia  Please start OTC iron sulfate 325 mg - 1 per day.  We will also refer to GI to look into any further evaluation that might be appropriate, as well as the referral to Nephrology (kidney doctors).  As you know, you should also hear from [place] about the Urology referral..    Troy Kanouse to please inform pt's Daughter (not the pt who has dementia), I will do referrals

## 2017-03-12 NOTE — Telephone Encounter (Signed)
Results were given. Patient expressed understanding.

## 2017-03-13 ENCOUNTER — Encounter: Payer: Self-pay | Admitting: Cardiology

## 2017-03-13 ENCOUNTER — Ambulatory Visit (INDEPENDENT_AMBULATORY_CARE_PROVIDER_SITE_OTHER): Payer: Medicare Other | Admitting: Cardiology

## 2017-03-13 VITALS — BP 142/50 | HR 66 | Ht 67.0 in | Wt 125.6 lb

## 2017-03-13 DIAGNOSIS — N184 Chronic kidney disease, stage 4 (severe): Secondary | ICD-10-CM

## 2017-03-13 DIAGNOSIS — I2510 Atherosclerotic heart disease of native coronary artery without angina pectoris: Secondary | ICD-10-CM

## 2017-03-13 DIAGNOSIS — D631 Anemia in chronic kidney disease: Secondary | ICD-10-CM | POA: Diagnosis not present

## 2017-03-13 DIAGNOSIS — I779 Disorder of arteries and arterioles, unspecified: Secondary | ICD-10-CM | POA: Diagnosis not present

## 2017-03-13 DIAGNOSIS — I340 Nonrheumatic mitral (valve) insufficiency: Secondary | ICD-10-CM | POA: Diagnosis not present

## 2017-03-13 DIAGNOSIS — I2119 ST elevation (STEMI) myocardial infarction involving other coronary artery of inferior wall: Secondary | ICD-10-CM

## 2017-03-13 DIAGNOSIS — I10 Essential (primary) hypertension: Secondary | ICD-10-CM | POA: Diagnosis not present

## 2017-03-13 DIAGNOSIS — Z9861 Coronary angioplasty status: Secondary | ICD-10-CM | POA: Diagnosis not present

## 2017-03-13 DIAGNOSIS — R0609 Other forms of dyspnea: Secondary | ICD-10-CM

## 2017-03-13 DIAGNOSIS — R079 Chest pain, unspecified: Secondary | ICD-10-CM

## 2017-03-13 DIAGNOSIS — I739 Peripheral vascular disease, unspecified: Secondary | ICD-10-CM

## 2017-03-13 MED ORDER — ISOSORBIDE MONONITRATE ER 30 MG PO TB24: 30.0000 mg | ORAL_TABLET | Freq: Every day | ORAL | 3 refills | Status: DC

## 2017-03-13 NOTE — Patient Instructions (Signed)
Medication Instructions: START Isosorbide 30 mg daily at bedtime.  Testing: Your physician has requested that you have a lexiscan myoview. For further information please visit [email or web address]. Please follow instruction sheet, as given.  Follow-Up: Your physician recommends that you schedule a follow-up appointment in: 1 month with Dr. Ellyn Hack.   Any Other Special Instructions will be listed below:  Pharmacologic Stress Electrocardiogram Introduction A pharmacologic stress electrocardiogram is a heart (cardiac) test that uses nuclear imaging to evaluate the blood supply to your heart. This test may also be called a pharmacologic stress electrocardiography. Pharmacologic means that a medicine is used to increase your heart rate and blood pressure. This stress test is done to find areas of poor blood flow to the heart by determining the extent of coronary artery disease (CAD). Some people exercise on a treadmill, which naturally increases the blood flow to the heart. For those people unable to exercise on a treadmill, a medicine is used. This medicine stimulates your heart and will cause your heart to beat harder and more quickly, as if you were exercising. Pharmacologic stress tests can help determine:  The adequacy of blood flow to your heart during increased levels of activity in order to clear you for discharge home.  The extent of coronary artery blockage caused by CAD.  Your prognosis if you have suffered a heart attack.  The effectiveness of cardiac procedures done, such as an angioplasty, which can increase the circulation in your coronary arteries.  Causes of chest pain or pressure. LET [facility] CARE PROVIDER KNOW ABOUT:  Any allergies you have.  All medicines you are taking, including vitamins, herbs, eye drops, creams, and over-the-counter medicines.  Previous problems you or members of your family have had with the use of anesthetics.  Any blood disorders you  have.  Previous surgeries you have had.  Medical conditions you have.  Possibility of pregnancy, if this applies.  If you are currently breastfeeding. RISKS AND COMPLICATIONS Generally, this is a safe procedure. However, as with any procedure, complications can occur. Possible complications include:  You develop pain or pressure in the following areas:  Chest.  Jaw or neck.  Between your shoulder blades.  Radiating down your left arm.  Headache.  Dizziness or light-headedness.  Shortness of breath.  Increased or irregular heartbeat.  Low blood pressure.  Nausea or vomiting.  Flushing.  Redness going up the arm and slight pain during injection of medicine.  Heart attack (rare). BEFORE THE PROCEDURE  Avoid all forms of caffeine for 24 hours before your test or as directed by your health care provider. This includes coffee, tea (even decaffeinated tea), caffeinated sodas, chocolate, cocoa, and certain pain medicines.  Follow your health care provider's instructions regarding eating and drinking before the test.  Take your medicines as directed at regular times with water unless instructed otherwise. Exceptions may include:  If you have diabetes, ask how you are to take your insulin or pills. It is common to adjust insulin dosing the morning of the test.  If you are taking beta-blocker medicines, it is important to talk to your health care provider about these medicines well before the date of your test. Taking beta-blocker medicines may interfere with the test. In some cases, these medicines need to be changed or stopped 24 hours or more before the test.  If you wear a nitroglycerin patch, it may need to be removed prior to the test. Ask your health care provider if the patch should be removed before  the test.  If you use an inhaler for any breathing condition, bring it with you to the test.  If you are an outpatient, bring a snack so you can eat right after the  stress phase of the test.  Do not smoke for 4 hours prior to the test or as directed by your health care provider.  Do not apply lotions, powders, creams, or oils on your chest prior to the test.  Wear comfortable shoes and clothing. Let your health care provider know if you were unable to complete or follow the preparations for your test. PROCEDURE  Multiple patches (electrodes) will be put on your chest. If needed, small areas of your chest may be shaved to get better contact with the electrodes. Once the electrodes are attached to your body, multiple wires will be attached to the electrodes, and your heart rate will be monitored.  An IV access will be started. A nuclear trace (isotope) is given. The isotope may be given intravenously, or it may be swallowed. Nuclear refers to several types of radioactive isotopes, and the nuclear isotope lights up the arteries so that the nuclear images are clear. The isotope is absorbed by your body. This results in low radiation exposure.  A resting nuclear image is taken to show how your heart functions at rest.  A medicine is given through the IV access.  A second scan is done about 1 hour after the medicine injection and determines how your heart functions under stress.  During this stress phase, you will be connected to an electrocardiogram machine. Your blood pressure and oxygen levels will be monitored. What to expect after the procedure  Your heart rate and blood pressure will be monitored after the test.  You may return to your normal schedule, including diet,activities, and medicines, unless your health care provider tells you otherwise. This information is not intended to replace advice given to you by your health care provider. Make sure you discuss any questions you have with your health care provider. Document Released: 04/27/2009 Document Revised: 05/16/2016 Document Reviewed: 06/17/2016 Elsevier Interactive Patient Education  2017  [employer].    If you need a refill on your cardiac medications before your next appointment, please call your pharmacy.

## 2017-03-13 NOTE — Progress Notes (Signed)
PCP: Cathlean Cower, MD  Clinic Note: Chief Complaint  Patient presents with  . Follow-up    CAD  . Shortness of Breath    when exerting self.    Problem List: 1. Chest pain with moderate risk for cardiac etiology   2. CAD S/P percutaneous coronary angioplasty - DES in RCA with PTCA for ISR; Moderate mLAD, 80% ostial OM2 stable   3. Essential hypertension   4. H/O Inferior STEMI (09/2013) emergent PCI with Promus DES to RCA (3.0 mm  x 28 mm - 3.2 mm)   5. Left-sided carotid artery disease -- s/p CEA   6. Non-rheumatic mitral regurgitation   7. Anemia of chronic renal failure, stage 4 (severe) (HCC)   8. Dyspnea on exertion     HPI: Nicholas Porter is a 83 y.o. male with a PMH below who presents today for 6 month f/u - CAD-PCI (h/o STEMI), mild AS. Last seen September 2017  Cardiac cath 05/2014 - PTCA of RCA ISR. Myoview 08/2014: Low risk stress nuclear study Diaphragmatic attenuation artifact.; Normal LV Function / wall motion. No Infarct/ischemia.  Nicholas Porter was last seen in September 2017, he is doing well without any complaints. Filling as well as he has in years.  Recent Hospitalizations: 02/23/2017 - unable to urinate, had a fall --> acute renal failure  Studies Reviewed:   2-D echo September 2017: Normal EF 55-60%. Mild to moderate aortic stenosis with moderate regurgitation. Mild MR.  Carotid Dopplers September 2017 : Stable less than 40% right internal carotid stenosis and stable moderate 40-60% left internal carotid stenosis. Patent vertebral and subclavian arteries.  Interval History: Nicholas Porter presents today really without any major cardiac symptoms.  He is finally now starting to recover from his recent hospital plan. His ARB was held from his last hospitalization - related to acute renal insufficiency. He as usual still notes his exertional dyspnea which has been chronic when he walks up the driveway. He is had a couple episodes of chest discomfort occasionally with  exertion. He is not sure what this is related because he has been diagnosed with iron deficiency anemia.  He does not note any chest discomfort with routine walking around however. He denies any PND, orthopnea or edema.  No palpitations, lightheadedness, dizziness, weakness or syncope/near syncope. No TIA/amaurosis fugax symptoms. No melena, hematochezia, hematuria, or epstaxis. No claudication.  ROS: A comprehensive was performed. Review of Systems  Constitutional: Negative for malaise/fatigue and weight loss (stable weight).       Does not really note reduced appetite  HENT: Negative for nosebleeds.   Respiratory: Negative for cough and wheezing. Shortness of breath: stable with exertion.   Gastrointestinal: Negative for blood in stool and melena.  Genitourinary: Negative for hematuria.  Musculoskeletal: Positive for back pain and joint pain (mild arthralgias.). Negative for myalgias and neck pain.  Neurological: Positive for dizziness (Positional.). Negative for tingling, tremors, sensory change, speech change, focal weakness, seizures, loss of consciousness, weakness and headaches.  Psychiatric/Behavioral: Negative.  Negative for memory loss.  All other systems reviewed and are negative.   Past Medical History:  Diagnosis Date  . Anemia of chronic renal failure, stage 4 (severe) ([place]) 11/27/2015  . 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 CAD S/P percutaneous coronary angioplasty - DES in RCA with PTCA for ISR; Moderate mLAD, 80% ostial OM2 stable 05/22/2008   Qualifier: Diagnosis of  By: Linda Hedges MD, Legrand [place]  E   . CKD (chronic kidney disease) stage 3, GFR 30-59 ml/min   . Essential hypertension 06/30/2007   Qualifier: Diagnosis of  By: Cori Razor RN, Mikal Plane    . H/O Inferior STEMI (09/2013) emergent PCI with Promus DES to RCA (3.0 mm  x 28 mm - 3.2 mm) 10/05/2013  . Left-sided carotid artery disease --  s/p CEA 04/06/2006   S/p Left CEA   . Mitral regurgitation - onexam 09/03/2016    Past Surgical History:  Procedure Laterality Date  . AMPUTATION     left index finger -tramatic  . CARDIAC CATHETERIZATION  November 2011   40% mid LAD, 50% proximal RCA, 30-40% distal RCA (DOMINANT RCA with wraparound PDA & 2 large PLBs), 60-70% ostial OM1. No change from 2001. Medical therapy  . CAROTID ENDARTERECTOMY Left 2007   Dr.Hayes: 08/2016 Dopplers: Stable less than 40% right internal carotid stenosis and stable moderate 40-60% left internal carotid stenosis. Patent vertebral and subclavian arteries.  . CARPAL TUNNEL RELEASE    . CORNEAL TRANSPLANT    . CORONARY ANGIOPLASTY WITH STENT PLACEMENT  09/2013; 05/2014    d) Promus DES 3.0 mm x 28 mm (3.2 mm)  to RCA with STEMI; residual 70% mid-distal LAD, OM 270-80%. Medical management; b) PTCA of 75 % ISR , LAD lesion noted as ~40%  . FOOT SURGERY     left  . HERNIA REPAIR    . LEFT HEART CATHETERIZATION WITH CORONARY ANGIOGRAM N/A 10/05/2013   Procedure: LEFT HEART CATHETERIZATION WITH CORONARY ANGIOGRAM;  Surgeon: Peter M Martinique, MD;  Location[facility] LAB;  Service: Cardiovascular;  RCA 100% - PCI,CxOM stable, LAD ~70% mid;  . LEFT HEART CATHETERIZATION WITH CORONARY ANGIOGRAM N/A 06/08/2014   Procedure: LEFT HEART CATHETERIZATION WITH CORONARY ANGIOGRAM;  Surgeon: Troy Sine, MD;  Location: [facility] CATH LAB;  Service: Cardiovascular;  UA 6/'15: 75% ISR in RCA - PTCA, LAD lesion ~40%, CxOM stable.    Marland Kitchen NM MYOVIEW LTD  11/2013; 08/1014   a) 12/'14: EF 59%, Fixed Inferior-Inferoapical Defect c/w Inferior Scar; No Ischemia; b) 9/'15:  9/'15: EF 55% - persistent fixed inferior-inferoapical perfusion defect  - read as diaphragmatic attenuation, likely consistent with infarct/scar   . PARTIAL GASTRECTOMY    . PERCUTANEOUS CORONARY INTERVENTION-BALLOON ONLY  06/08/2014   Procedure: PERCUTANEOUS CORONARY INTERVENTION-BALLOON ONLY;  Surgeon: Troy Sine, MD;   Location: [facility] CATH LAB;  Service: Cardiovascular;;  . ROTATOR CUFF REPAIR     left  . SHOULDER ARTHROSCOPY WITH ROTATOR CUFF REPAIR AND SUBACROMIAL DECOMPRESSION Right 07/01/2013   Procedure: RIGHT SHOULDER ARTHROSCOPY WITH  SUBACROMIAL DECOMPRESSION/DISTAL CLAVICLE RESECTION/ROTATOR CUFF REPAIR ;  Surgeon: Marin Shutter, MD;  Location: [place];  Service: Orthopedics;  Laterality: Right;  . SPINE SURGERY  1985   cervical laminectomy  . TOTAL KNEE ARTHROPLASTY    . TRANSTHORACIC ECHOCARDIOGRAM  08/2016   Normal EF 55-60%. Mild to moderate aortic stenosis with moderate regurgitation. Mild MR.  . VAGOTOMY     partial gastrectomy   Lab Results  Component Value Date   CREATININE 1.81 (H) 03/11/2017   BUN 33 (H) 03/11/2017   NA 140 03/11/2017   K 4.4 03/11/2017   CL 113 (H) 03/11/2017   CO2 21 03/11/2017   Lab Results  Component Value Date   WBC 6.4 03/11/2017   HGB 8.5 Repeated and verified X2. (L) 03/11/2017   HCT 26.4 Repeated and verified X2. (L) 03/11/2017   MCV 85.8 03/11/2017   PLT 183.0  03/11/2017    Allergies  Allergen Reactions  . Amlodipine Besylate Hives  . Hydralazine Itching  . Lipitor [Atorvastatin] Itching  . Naproxen Diarrhea  . Oxycodone-Acetaminophen Itching   Current Meds  Medication Sig  . brinzolamide (AZOPT) 1 % ophthalmic suspension Place 1 drop into the left eye every 12 (twelve) hours.   . ciprofloxacin (CIPRO) 500 MG tablet Take 1 tablet (500 mg total) by mouth 2 (two) times daily.  . clopidogrel (PLAVIX) 75 MG tablet Take 1 tablet (75 mg total) by mouth daily.  . feeding supplement (BOOST / RESOURCE BREEZE) LIQD Take 1 Container by mouth 3 (three) times daily between meals.  Marland Kitchen losartan (COZAAR) 50 MG tablet Take 1 tablet (50 mg total) by mouth daily. HOLD UNTIL FOLLOW UP WITH PRIMARY CARE DOCTOR  . metoprolol succinate (TOPROL-XL) 25 MG 24 hr tablet Take 1.5 tablets (37.5 mg total) by mouth daily.  . nitroGLYCERIN (NITROSTAT) 0.4 MG SL tablet Place 1  tablet (0.4 mg total) under the tongue every 5 (five) minutes as needed. For chest pain.  . phenazopyridine (PYRIDIUM) 100 MG tablet Take 1 tablet (100 mg total) by mouth 2 (two) times daily.  Marland Kitchen saccharomyces boulardii (FLORASTOR) 250 MG capsule Take 1 capsule (250 mg total) by mouth 2 (two) times daily.  . TRAVATAN Z 0.004 % SOLN ophthalmic solution Place 1 drop into both eyes at bedtime.   -- Off of Losartan  Social History   Social History  . Marital status: Married    Spouse name: N/A  . Number of children: N/A  . Years of education: N/A   Occupational History  .  Retired    Retired   Social History Main Topics  . Smoking status: Former Smoker    Types: Cigarettes    Quit date: 12/23/1964  . Smokeless tobacco: Never Used  . Alcohol use No  . Drug use: No  . Sexual activity: Not Asked   Other Topics Concern  . None   Social History Narrative   He is married with 2 children. He lives with his wife.   Very active at home, prior to a STEMI, he could walk several miles without discomfort. Prior to his MI, he would use his push mower to mow his entire lawn.   He is a former smoker and quit back in 1966. He does not drink alcohol   Family History  Problem Relation Age of Onset  . Heart disease Sister   . Diabetes Sister     Wt Readings from Last 3 Encounters:  03/13/17 57 kg (125 lb 9.6 oz)  03/11/17 81.2 kg (179 lb)  02/28/17 60.5 kg (133 lb 4.8 oz)  -- Drinks plenty, but not eating as well. Stable    PHYSICAL EXAM BP (!) 142/50   Pulse 66   Ht 5\' 7"  (1.702 m)   Wt 57 kg (125 lb 9.6 oz)   BMI 19.67 kg/m  General: alert, cooperative, appears stated age, no distress and Well-groomed. Well-nourished. Normal mood and affect. Answers questions appropriately, but tends to ramble &get off track.  HEENT: [place]/AT, EOMI, MMM, anicteric sclera; arcus senilis -- several tissues with streaks of bloody mucous is noted in the trash can (some from his nose & some that he "spit-up"  -- not cough) Neck: no adenopathy, no JVD, supple, symmetrical; No LAN. louder left-sided carotid bruit.  Lungs: CTAB, normal percussion bilaterally and Nonlabored, good air movement  Heart: RRR, normal S1 and S2. Soft 1-2/6 early peaking c-d SEM  at RUSB as well as ~1/6 DM in same location Abdomen: Soft with mild right upper quadrant and epigastric tenderness. NABS. No rebound  Extremities: extremities normal, atraumatic, no cyanosis or edema  Pulses: 2+ and symmetric  Skin: Skin color, texture, turgor normal. No rashes or lesions or Just mild bruising from being on antiplatelet agents.  Neurologic: Grossly normal    Adult ECG Report n/a  Other studies Reviewed: Additional studies/ records that were reviewed today include:  Recent Labs:   Lab Results  Component Value Date   CHOL 122 07/20/2015   HDL 37.90 (L) 07/20/2015   LDLCALC 63 07/20/2015   TRIG 106.0 07/20/2015   CHOLHDL 3 07/20/2015    ASSESSMENT / PLAN: Problem List Items Addressed This Visit    Anemia of chronic renal failure, stage 4 (severe) (HCC) (Chronic)    During recent hospitalization. I would like to see his renal function stayed stable and normal restarting the ARB.      CAD S/P percutaneous coronary angioplasty - DES in RCA with PTCA for ISR; Moderate mLAD, 80% ostial OM2 stable (Chronic)    I had been concerned about his exertional dyspnea and now with him having an episode of chest pain relieved with nitroglycerin, I ordering a Myoview stress test. Starting low-dose Imdur. When he did confirm or deny the presence of active CAD because with his anemia, I am stopping his Plavix. He is not on statin.      Relevant Medications   isosorbide mononitrate (IMDUR) 30 MG 24 hr tablet   Chest pain with moderate risk for cardiac etiology - Primary    This the first time he actually mentioned to me having anything suggestive of angina chest pain - relieved with nitroglycerin. I'm concerned about it being  potentially anginal in nature. This is because he has exertional dyspnea as well. He is no longer on his ARB which I agree that probably should be held for a little while to make sure his renal function stable. For now I will start Imdur 30 mg daily because he has taken some nitroglycerin.  Plan: Lexiscan Myoview, and start Imdur 30 mg, continue with slightly increased dose of beta blocker      Relevant Orders   Myocardial Perfusion Imaging   Dyspnea on exertion (Chronic)    This has been getting progressively worse although he seems to his echo was relatively unrevealing. But now is having an episode of chest discomfort we are going to evaluate with a Myoview stress test.      Essential hypertension (Chronic)    Borderline control now despite being off his ARB. I would like to see his renal function stabilize over the next month or so before we decide whether to restart it. We will recheck labs next visit. If his renal function is stable, we can restart.      Relevant Medications   isosorbide mononitrate (IMDUR) 30 MG 24 hr tablet   H/O Inferior STEMI (09/2013) emergent PCI with Promus DES to RCA (3.0 mm  x 28 mm - 3.2 mm) (Chronic)    He had a persistent fixed inferoapical defect on the Myoview back in September 2050. This goes along with his prior infarct. I'm looking to see if there is any additional area of concern. Thankfully no heart failure symptoms.      Relevant Medications   isosorbide mononitrate (IMDUR) 30 MG 24 hr tablet   Left-sided carotid artery disease -- s/p CEA (Chronic)    Routine carotid Dopplers  monitor last year. Stable      Relevant Medications   isosorbide mononitrate (IMDUR) 30 MG 24 hr tablet   Mitral regurgitation - on exam    Only mild MR noted on recent echo.. I don't hear the holosystolic murmur is loud anymore. We'll continue to monitor.      Relevant Medications   isosorbide mononitrate (IMDUR) 30 MG 24 hr tablet      Current medicines are  reviewed at length with the patient today. (+/- concerns) none The following changes have been made:  Patient Instructions  Medication Instructions: START Isosorbide 30 mg daily at bedtime.  Testing: Your physician has requested that you have a lexiscan myoview. For further information please visit [email or web address]. Please follow instruction sheet, as given.  Follow-Up: Your physician recommends that you schedule a follow-up appointment in: 1 month with Dr. Ellyn Hack.   Studies Ordered:   Orders Placed This Encounter  Procedures  . Myocardial Perfusion Imaging   Carotid Dopplers    Glenetta Hew, M.D., M.S. Interventional Cardiologist   Pager # [phone] Phone # [phone] [address], [place] [address]

## 2017-03-15 ENCOUNTER — Encounter: Payer: Self-pay | Admitting: Cardiology

## 2017-03-15 ENCOUNTER — Other Ambulatory Visit: Payer: Self-pay | Admitting: Cardiology

## 2017-03-15 DIAGNOSIS — R079 Chest pain, unspecified: Secondary | ICD-10-CM | POA: Insufficient documentation

## 2017-03-15 NOTE — Assessment & Plan Note (Signed)
Routine carotid Dopplers monitor last year. Stable

## 2017-03-15 NOTE — Assessment & Plan Note (Signed)
Only mild MR noted on recent echo.. I don't hear the holosystolic murmur is loud anymore. We'll continue to monitor.

## 2017-03-15 NOTE — Assessment & Plan Note (Signed)
I had been concerned about his exertional dyspnea and now with him having an episode of chest pain relieved with nitroglycerin, I ordering a Myoview stress test. Starting low-dose Imdur. When he did confirm or deny the presence of active CAD because with his anemia, I am stopping his Plavix. He is not on statin.

## 2017-03-15 NOTE — Assessment & Plan Note (Signed)
This has been getting progressively worse although he seems to his echo was relatively unrevealing. But now is having an episode of chest discomfort we are going to evaluate with a Myoview stress test.

## 2017-03-15 NOTE — Assessment & Plan Note (Signed)
Borderline control now despite being off his ARB. I would like to see his renal function stabilize over the next month or so before we decide whether to restart it. We will recheck labs next visit. If his renal function is stable, we can restart.

## 2017-03-15 NOTE — Assessment & Plan Note (Signed)
He had a persistent fixed inferoapical defect on the Myoview back in September 2050. This goes along with his prior infarct. I'm looking to see if there is any additional area of concern. Thankfully no heart failure symptoms.

## 2017-03-15 NOTE — Assessment & Plan Note (Signed)
This the first time he actually mentioned to me having anything suggestive of angina chest pain - relieved with nitroglycerin. I'm concerned about it being potentially anginal in nature. This is because he has exertional dyspnea as well. He is no longer on his ARB which I agree that probably should be held for a little while to make sure his renal function stable. For now I will start Imdur 30 mg daily because he has taken some nitroglycerin.  Plan: Lexiscan Myoview, and start Imdur 30 mg, continue with slightly increased dose of beta blocker

## 2017-03-15 NOTE — Assessment & Plan Note (Signed)
During recent hospitalization. I would like to see his renal function stayed stable and normal restarting the ARB.

## 2017-03-16 NOTE — Assessment & Plan Note (Signed)
With mild worsening, for urology referral, cont same tx

## 2017-03-16 NOTE — Assessment & Plan Note (Signed)
stable overall by history and exam, recent data reviewed with pt, and pt to continue medical treatment as before,  to f/u any worsening symptoms or concerns Lab Results  Component Value Date   WBC 6.4 03/11/2017   HGB 8.5 Repeated and verified X2. (L) 03/11/2017   HCT 26.4 Repeated and verified X2. (L) 03/11/2017   MCV 85.8 03/11/2017   PLT 183.0 03/11/2017

## 2017-03-16 NOTE — Assessment & Plan Note (Signed)
Improved, to finish current antibx

## 2017-03-16 NOTE — Assessment & Plan Note (Signed)
Stable, to cont the metoprolol 1.5 tab bid

## 2017-03-17 NOTE — Telephone Encounter (Signed)
Rx(s) sent to pharmacy electronically.  

## 2017-03-18 DIAGNOSIS — H401132 Primary open-angle glaucoma, bilateral, moderate stage: Secondary | ICD-10-CM | POA: Diagnosis not present

## 2017-03-23 HISTORY — PX: NM MYOVIEW LTD: HXRAD82

## 2017-03-24 ENCOUNTER — Telehealth: Payer: Self-pay | Admitting: Cardiology

## 2017-03-24 NOTE — Telephone Encounter (Signed)
Patient aware of recommendations-will return call in 3 days to let us know if improves.

## 2017-03-24 NOTE — Telephone Encounter (Signed)
Pt is on Isosorbide,he is itching.Pt wants to know if it might be coming from the Isorbide and will it sop?

## 2017-03-24 NOTE — Telephone Encounter (Signed)
Rash and itching can occur with most medications.  Have him hold for 3 days to see if any improvement.

## 2017-03-24 NOTE — Telephone Encounter (Signed)
Returned call, spoke to wife (ok per DPR). Reports patient is itching on his left side near his armpit and rib area.  No redness or rash noted.  Reports this started last week after starting medication (isosorbide).  Denies change of lotion, body wash, soap, detergent, etc.  Advised I do not think this would be coming from the medication but would confirm with pharmacist.  Wife verbalized understanding.  Also wanted to verify location of stress test.  Aware test location is at [facility].

## 2017-03-25 ENCOUNTER — Inpatient Hospital Stay (HOSPITAL_COMMUNITY): Admission: RE | Admit: 2017-03-25 | Payer: Medicare Other | Source: Ambulatory Visit

## 2017-03-25 ENCOUNTER — Telehealth (HOSPITAL_COMMUNITY): Payer: Self-pay

## 2017-03-25 NOTE — Telephone Encounter (Signed)
Encounter complete. 

## 2017-03-27 ENCOUNTER — Telehealth: Payer: Self-pay | Admitting: *Deleted

## 2017-03-27 ENCOUNTER — Ambulatory Visit (HOSPITAL_BASED_OUTPATIENT_CLINIC_OR_DEPARTMENT_OTHER)
Admission: RE | Admit: 2017-03-27 | Discharge: 2017-03-27 | Disposition: A | Discharge status: Home / Self Care | Payer: Medicare Other | Source: Ambulatory Visit | Attending: Cardiology | Admitting: Cardiology

## 2017-03-27 DIAGNOSIS — R079 Chest pain, unspecified: Secondary | ICD-10-CM

## 2017-03-27 DIAGNOSIS — R6520 Severe sepsis without septic shock: Secondary | ICD-10-CM | POA: Diagnosis not present

## 2017-03-27 DIAGNOSIS — E860 Dehydration: Secondary | ICD-10-CM | POA: Diagnosis not present

## 2017-03-27 DIAGNOSIS — G9341 Metabolic encephalopathy: Secondary | ICD-10-CM | POA: Diagnosis not present

## 2017-03-27 DIAGNOSIS — E43 Unspecified severe protein-calorie malnutrition: Secondary | ICD-10-CM | POA: Diagnosis not present

## 2017-03-27 DIAGNOSIS — N390 Urinary tract infection, site not specified: Secondary | ICD-10-CM | POA: Diagnosis not present

## 2017-03-27 DIAGNOSIS — R55 Syncope and collapse: Secondary | ICD-10-CM | POA: Diagnosis not present

## 2017-03-27 DIAGNOSIS — R740 Nonspecific elevation of levels of transaminase and lactic acid dehydrogenase [LDH]: Secondary | ICD-10-CM | POA: Diagnosis not present

## 2017-03-27 DIAGNOSIS — Z87891 Personal history of nicotine dependence: Secondary | ICD-10-CM | POA: Insufficient documentation

## 2017-03-27 DIAGNOSIS — R531 Weakness: Secondary | ICD-10-CM | POA: Diagnosis not present

## 2017-03-27 DIAGNOSIS — Z8249 Family history of ischemic heart disease and other diseases of the circulatory system: Secondary | ICD-10-CM | POA: Insufficient documentation

## 2017-03-27 DIAGNOSIS — A419 Sepsis, unspecified organism: Secondary | ICD-10-CM | POA: Diagnosis not present

## 2017-03-27 DIAGNOSIS — E872 Acidosis: Secondary | ICD-10-CM | POA: Diagnosis not present

## 2017-03-27 DIAGNOSIS — N179 Acute kidney failure, unspecified: Secondary | ICD-10-CM | POA: Diagnosis not present

## 2017-03-27 DIAGNOSIS — A4159 Other Gram-negative sepsis: Secondary | ICD-10-CM | POA: Diagnosis not present

## 2017-03-27 DIAGNOSIS — N183 Chronic kidney disease, stage 3 (moderate): Secondary | ICD-10-CM

## 2017-03-27 LAB — MYOCARDIAL PERFUSION IMAGING
CHL CUP RESTING HR STRESS: 60 {beats}/min
LVDIAVOL: 102 mL (ref 62–150)
LVSYSVOL: 50 mL
NUC STRESS TID: 1.09
Peak HR: 83 {beats}/min
SDS: 2
SRS: 0
SSS: 2

## 2017-03-27 MED ORDER — TECHNETIUM TC 99M TETROFOSMIN IV KIT
10.1000 | PACK | Freq: Once | INTRAVENOUS | Status: AC | PRN
  Administered 2017-03-27: 10.1 via INTRAVENOUS
  Filled 2017-03-27: qty 11

## 2017-03-27 MED ORDER — REGADENOSON 0.4 MG/5ML IV SOLN
0.4000 mg | Freq: Once | INTRAVENOUS | Status: AC
  Administered 2017-03-27: 0.4 mg via INTRAVENOUS

## 2017-03-27 MED ORDER — TECHNETIUM TC 99M TETROFOSMIN IV KIT
31.5000 | PACK | Freq: Once | INTRAVENOUS | Status: AC | PRN
  Administered 2017-03-27: 31.5 via INTRAVENOUS
  Filled 2017-03-27: qty 32

## 2017-03-27 NOTE — Progress Notes (Signed)
Stress Test Result: Images look good. MI or Ischemia Low Normal function.  Leonie Man, MD

## 2017-03-27 NOTE — Telephone Encounter (Signed)
Richarda Blade that this ac is a true ute reaction to isosorbide. However his stress test was read as low risk with no ischemia. Therefore I'm fine with stopping it.  Glenetta Hew, MD

## 2017-03-27 NOTE — Telephone Encounter (Signed)
Patient here for stress test, he reported to the nurse when he started the isosorbide he broke out in an itchy rash. He stopped the medication and the rash went away. He wants to make dr harding aware. Isosorbide placed on allergy list. Will forward to dr harding.

## 2017-03-28 ENCOUNTER — Telehealth: Payer: Self-pay

## 2017-03-28 ENCOUNTER — Emergency Department (HOSPITAL_COMMUNITY): Payer: Medicare Other

## 2017-03-28 ENCOUNTER — Encounter (HOSPITAL_COMMUNITY): Payer: Self-pay | Admitting: Emergency Medicine

## 2017-03-28 ENCOUNTER — Inpatient Hospital Stay (HOSPITAL_COMMUNITY)
Admission: EM | Admit: 2017-03-28 | Discharge: 2017-04-08 | DRG: 871 | Disposition: A | Discharge status: Home Health | Payer: Medicare Other | Attending: Family Medicine | Admitting: Family Medicine

## 2017-03-28 ENCOUNTER — Encounter (HOSPITAL_COMMUNITY): Payer: Medicare Other

## 2017-03-28 DIAGNOSIS — N390 Urinary tract infection, site not specified: Secondary | ICD-10-CM | POA: Diagnosis present

## 2017-03-28 DIAGNOSIS — N179 Acute kidney failure, unspecified: Secondary | ICD-10-CM | POA: Diagnosis not present

## 2017-03-28 DIAGNOSIS — N183 Chronic kidney disease, stage 3 (moderate): Secondary | ICD-10-CM | POA: Diagnosis not present

## 2017-03-28 DIAGNOSIS — R6520 Severe sepsis without septic shock: Secondary | ICD-10-CM

## 2017-03-28 DIAGNOSIS — Z87891 Personal history of nicotine dependence: Secondary | ICD-10-CM | POA: Diagnosis not present

## 2017-03-28 DIAGNOSIS — A419 Sepsis, unspecified organism: Secondary | ICD-10-CM | POA: Diagnosis not present

## 2017-03-28 DIAGNOSIS — N400 Enlarged prostate without lower urinary tract symptoms: Secondary | ICD-10-CM | POA: Diagnosis present

## 2017-03-28 DIAGNOSIS — Z66 Do not resuscitate: Secondary | ICD-10-CM | POA: Diagnosis not present

## 2017-03-28 DIAGNOSIS — Z79899 Other long term (current) drug therapy: Secondary | ICD-10-CM

## 2017-03-28 DIAGNOSIS — A4159 Other Gram-negative sepsis: Secondary | ICD-10-CM | POA: Diagnosis not present

## 2017-03-28 DIAGNOSIS — R0603 Acute respiratory distress: Secondary | ICD-10-CM | POA: Diagnosis present

## 2017-03-28 DIAGNOSIS — I248 Other forms of acute ischemic heart disease: Secondary | ICD-10-CM | POA: Diagnosis present

## 2017-03-28 DIAGNOSIS — N189 Chronic kidney disease, unspecified: Secondary | ICD-10-CM | POA: Diagnosis present

## 2017-03-28 DIAGNOSIS — I2510 Atherosclerotic heart disease of native coronary artery without angina pectoris: Secondary | ICD-10-CM

## 2017-03-28 DIAGNOSIS — Z833 Family history of diabetes mellitus: Secondary | ICD-10-CM

## 2017-03-28 DIAGNOSIS — N184 Chronic kidney disease, stage 4 (severe): Secondary | ICD-10-CM | POA: Diagnosis not present

## 2017-03-28 DIAGNOSIS — I739 Peripheral vascular disease, unspecified: Secondary | ICD-10-CM

## 2017-03-28 DIAGNOSIS — Z681 Body mass index (BMI) 19 or less, adult: Secondary | ICD-10-CM

## 2017-03-28 DIAGNOSIS — D631 Anemia in chronic kidney disease: Secondary | ICD-10-CM | POA: Diagnosis present

## 2017-03-28 DIAGNOSIS — E611 Iron deficiency: Secondary | ICD-10-CM | POA: Diagnosis present

## 2017-03-28 DIAGNOSIS — E876 Hypokalemia: Secondary | ICD-10-CM | POA: Diagnosis present

## 2017-03-28 DIAGNOSIS — E43 Unspecified severe protein-calorie malnutrition: Secondary | ICD-10-CM | POA: Diagnosis present

## 2017-03-28 DIAGNOSIS — G9341 Metabolic encephalopathy: Secondary | ICD-10-CM

## 2017-03-28 DIAGNOSIS — R531 Weakness: Secondary | ICD-10-CM | POA: Diagnosis not present

## 2017-03-28 DIAGNOSIS — Z8249 Family history of ischemic heart disease and other diseases of the circulatory system: Secondary | ICD-10-CM

## 2017-03-28 DIAGNOSIS — R197 Diarrhea, unspecified: Secondary | ICD-10-CM | POA: Diagnosis present

## 2017-03-28 DIAGNOSIS — I779 Disorder of arteries and arterioles, unspecified: Secondary | ICD-10-CM | POA: Diagnosis not present

## 2017-03-28 DIAGNOSIS — E872 Acidosis: Secondary | ICD-10-CM | POA: Diagnosis present

## 2017-03-28 DIAGNOSIS — D696 Thrombocytopenia, unspecified: Secondary | ICD-10-CM | POA: Diagnosis not present

## 2017-03-28 DIAGNOSIS — R7989 Other specified abnormal findings of blood chemistry: Secondary | ICD-10-CM | POA: Diagnosis present

## 2017-03-28 DIAGNOSIS — I129 Hypertensive chronic kidney disease with stage 1 through stage 4 chronic kidney disease, or unspecified chronic kidney disease: Secondary | ICD-10-CM | POA: Diagnosis present

## 2017-03-28 DIAGNOSIS — N401 Enlarged prostate with lower urinary tract symptoms: Secondary | ICD-10-CM | POA: Diagnosis not present

## 2017-03-28 DIAGNOSIS — R0602 Shortness of breath: Secondary | ICD-10-CM | POA: Diagnosis not present

## 2017-03-28 DIAGNOSIS — N2889 Other specified disorders of kidney and ureter: Secondary | ICD-10-CM | POA: Diagnosis not present

## 2017-03-28 DIAGNOSIS — I951 Orthostatic hypotension: Secondary | ICD-10-CM | POA: Diagnosis present

## 2017-03-28 DIAGNOSIS — I252 Old myocardial infarction: Secondary | ICD-10-CM | POA: Diagnosis not present

## 2017-03-28 DIAGNOSIS — I10 Essential (primary) hypertension: Secondary | ICD-10-CM | POA: Diagnosis not present

## 2017-03-28 DIAGNOSIS — E8770 Fluid overload, unspecified: Secondary | ICD-10-CM | POA: Diagnosis present

## 2017-03-28 DIAGNOSIS — Z7902 Long term (current) use of antithrombotics/antiplatelets: Secondary | ICD-10-CM

## 2017-03-28 DIAGNOSIS — R7889 Finding of other specified substances, not normally found in blood: Secondary | ICD-10-CM | POA: Diagnosis not present

## 2017-03-28 DIAGNOSIS — R55 Syncope and collapse: Secondary | ICD-10-CM | POA: Diagnosis not present

## 2017-03-28 DIAGNOSIS — R509 Fever, unspecified: Secondary | ICD-10-CM | POA: Diagnosis present

## 2017-03-28 DIAGNOSIS — D6959 Other secondary thrombocytopenia: Secondary | ICD-10-CM | POA: Diagnosis present

## 2017-03-28 DIAGNOSIS — R740 Nonspecific elevation of levels of transaminase and lactic acid dehydrogenase [LDH]: Secondary | ICD-10-CM | POA: Diagnosis not present

## 2017-03-28 DIAGNOSIS — E860 Dehydration: Secondary | ICD-10-CM

## 2017-03-28 DIAGNOSIS — Z955 Presence of coronary angioplasty implant and graft: Secondary | ICD-10-CM

## 2017-03-28 DIAGNOSIS — B961 Klebsiella pneumoniae [K. pneumoniae] as the cause of diseases classified elsewhere: Secondary | ICD-10-CM

## 2017-03-28 DIAGNOSIS — B9689 Other specified bacterial agents as the cause of diseases classified elsewhere: Secondary | ICD-10-CM | POA: Diagnosis present

## 2017-03-28 DIAGNOSIS — Z9861 Coronary angioplasty status: Secondary | ICD-10-CM | POA: Diagnosis not present

## 2017-03-28 DIAGNOSIS — A414 Sepsis due to anaerobes: Secondary | ICD-10-CM | POA: Diagnosis not present

## 2017-03-28 DIAGNOSIS — Z903 Acquired absence of stomach [part of]: Secondary | ICD-10-CM

## 2017-03-28 DIAGNOSIS — J9601 Acute respiratory failure with hypoxia: Secondary | ICD-10-CM | POA: Diagnosis not present

## 2017-03-28 DIAGNOSIS — R748 Abnormal levels of other serum enzymes: Secondary | ICD-10-CM | POA: Diagnosis not present

## 2017-03-28 DIAGNOSIS — R351 Nocturia: Secondary | ICD-10-CM | POA: Diagnosis not present

## 2017-03-28 HISTORY — DX: Metabolic encephalopathy: G93.41

## 2017-03-28 LAB — COMPREHENSIVE METABOLIC PANEL
ALBUMIN: 3.3 g/dL — AB (ref 3.5–5.0)
ALT: 11 U/L — ABNORMAL LOW (ref 17–63)
AST: 30 U/L (ref 15–41)
Alkaline Phosphatase: 71 U/L (ref 38–126)
Anion gap: 10 (ref 5–15)
BILIRUBIN TOTAL: 0.8 mg/dL (ref 0.3–1.2)
BUN: 53 mg/dL — AB (ref 6–20)
CHLORIDE: 115 mmol/L — AB (ref 101–111)
CO2: 13 mmol/L — ABNORMAL LOW (ref 22–32)
Calcium: 8.5 mg/dL — ABNORMAL LOW (ref 8.9–10.3)
Creatinine, Ser: 2.99 mg/dL — ABNORMAL HIGH (ref 0.61–1.24)
GFR calc Af Amer: 20 mL/min — ABNORMAL LOW (ref >60)
GFR calc non Af Amer: 18 mL/min — ABNORMAL LOW (ref >60)
GLUCOSE: 197 mg/dL — AB (ref 65–99)
POTASSIUM: 4.7 mmol/L (ref 3.5–5.1)
Sodium: 138 mmol/L (ref 135–145)
Total Protein: 6.6 g/dL (ref 6.5–8.1)

## 2017-03-28 LAB — CBC WITH DIFFERENTIAL/PLATELET
BASOS PCT: 0 %
Basophils Absolute: 0 10*3/uL (ref 0.0–0.1)
EOS PCT: 0 %
Eosinophils Absolute: 0 10*3/uL (ref 0.0–0.7)
HEMATOCRIT: 30 % — AB (ref 39.0–52.0)
Hemoglobin: 9.5 g/dL — ABNORMAL LOW (ref 13.0–17.0)
LYMPHS PCT: 2 %
Lymphs Abs: 0.2 10*3/uL — ABNORMAL LOW (ref 0.7–4.0)
MCH: 28.4 pg (ref 26.0–34.0)
MCHC: 31.7 g/dL (ref 30.0–36.0)
MCV: 89.8 fL (ref 78.0–100.0)
MONOS PCT: 1 %
Monocytes Absolute: 0.1 10*3/uL (ref 0.1–1.0)
NEUTROS PCT: 97 %
Neutro Abs: 7.8 10*3/uL — ABNORMAL HIGH (ref 1.7–7.7)
Platelets: 76 10*3/uL — ABNORMAL LOW (ref 150–400)
RBC: 3.34 MIL/uL — ABNORMAL LOW (ref 4.22–5.81)
RDW: 21 % — ABNORMAL HIGH (ref 11.5–15.5)
WBC: 8.1 10*3/uL (ref 4.0–10.5)

## 2017-03-28 LAB — I-STAT CG4 LACTIC ACID, ED: Lactic Acid, Venous: 2.69 mmol/L (ref 0.5–1.9)

## 2017-03-28 LAB — PROTIME-INR
INR: 1.26
Prothrombin Time: 15.9 seconds — ABNORMAL HIGH (ref 11.4–15.2)

## 2017-03-28 MED ORDER — ACETAMINOPHEN 650 MG RE SUPP
650.0000 mg | Freq: Four times a day (QID) | RECTAL | Status: DC | PRN
  Administered 2017-03-29: 650 mg via RECTAL
  Filled 2017-03-28: qty 1

## 2017-03-28 MED ORDER — SODIUM CHLORIDE 0.9 % IV BOLUS (SEPSIS)
1000.0000 mL | Freq: Once | INTRAVENOUS | Status: AC
  Administered 2017-03-28: 1000 mL via INTRAVENOUS

## 2017-03-28 MED ORDER — CLOPIDOGREL BISULFATE 75 MG PO TABS
75.0000 mg | ORAL_TABLET | Freq: Every day | ORAL | Status: DC
  Administered 2017-03-29 – 2017-04-08 (×11): 75 mg via ORAL
  Filled 2017-03-28 (×11): qty 1

## 2017-03-28 MED ORDER — ACETAMINOPHEN 500 MG PO TABS
1000.0000 mg | ORAL_TABLET | Freq: Once | ORAL | Status: AC
  Administered 2017-03-28: 1000 mg via ORAL
  Filled 2017-03-28: qty 2

## 2017-03-28 MED ORDER — NITROGLYCERIN 0.4 MG SL SUBL
0.4000 mg | SUBLINGUAL_TABLET | SUBLINGUAL | Status: DC | PRN
  Administered 2017-03-29: 0.4 mg via SUBLINGUAL
  Filled 2017-03-28: qty 1

## 2017-03-28 MED ORDER — VANCOMYCIN HCL IN DEXTROSE 750-5 MG/150ML-% IV SOLN: 750.0000 mg | INTRAVENOUS | Status: DC

## 2017-03-28 MED ORDER — ONDANSETRON HCL 4 MG/2ML IJ SOLN: 4.0000 mg | Freq: Four times a day (QID) | INTRAMUSCULAR | Status: DC | PRN

## 2017-03-28 MED ORDER — SODIUM CHLORIDE 0.9 % IV BOLUS (SEPSIS)
500.0000 mL | Freq: Once | INTRAVENOUS | Status: AC
Start: 2017-03-28 — End: 2017-03-29
  Administered 2017-03-28: 500 mL via INTRAVENOUS

## 2017-03-28 MED ORDER — ACETAMINOPHEN 325 MG PO TABS
650.0000 mg | ORAL_TABLET | Freq: Four times a day (QID) | ORAL | Status: DC | PRN
  Administered 2017-04-02 – 2017-04-05 (×3): 650 mg via ORAL
  Filled 2017-03-28 (×3): qty 2

## 2017-03-28 MED ORDER — PIPERACILLIN-TAZOBACTAM 3.375 G IVPB 30 MIN
3.3750 g | Freq: Once | INTRAVENOUS | Status: AC
  Administered 2017-03-28: 3.375 g via INTRAVENOUS
  Filled 2017-03-28: qty 50

## 2017-03-28 MED ORDER — VANCOMYCIN HCL IN DEXTROSE 1-5 GM/200ML-% IV SOLN
1000.0000 mg | Freq: Once | INTRAVENOUS | Status: AC
Start: 2017-03-28 — End: 2017-03-29
  Administered 2017-03-28: 1000 mg via INTRAVENOUS
  Filled 2017-03-28: qty 200

## 2017-03-28 MED ORDER — BRINZOLAMIDE 1 % OP SUSP
1.0000 [drp] | Freq: Two times a day (BID) | OPHTHALMIC | Status: DC
  Administered 2017-03-29 – 2017-04-08 (×20): 1 [drp] via OPHTHALMIC
  Filled 2017-03-28 (×3): qty 10

## 2017-03-28 MED ORDER — LABETALOL HCL 5 MG/ML IV SOLN: 10.0000 mg | Freq: Once | INTRAVENOUS | Status: DC

## 2017-03-28 MED ORDER — FERROUS SULFATE 325 (65 FE) MG PO TABS
325.0000 mg | ORAL_TABLET | Freq: Every day | ORAL | Status: DC
  Administered 2017-03-29 – 2017-04-02 (×5): 325 mg via ORAL
  Filled 2017-03-28 (×5): qty 1

## 2017-03-28 MED ORDER — LATANOPROST 0.005 % OP SOLN
1.0000 [drp] | Freq: Every day | OPHTHALMIC | Status: DC
  Administered 2017-03-29 – 2017-04-07 (×10): 1 [drp] via OPHTHALMIC
  Filled 2017-03-28 (×3): qty 2.5

## 2017-03-28 MED ORDER — SODIUM BICARBONATE 8.4 % IV SOLN
INTRAVENOUS | Status: DC
  Administered 2017-03-29 (×2): via INTRAVENOUS
  Filled 2017-03-28 (×2): qty 150

## 2017-03-28 MED ORDER — PIPERACILLIN-TAZOBACTAM IN DEX 2-0.25 GM/50ML IV SOLN
2.2500 g | Freq: Four times a day (QID) | INTRAVENOUS | Status: DC
  Administered 2017-03-29 – 2017-03-31 (×9): 2.25 g via INTRAVENOUS
  Filled 2017-03-28 (×11): qty 50

## 2017-03-28 MED ORDER — SACCHAROMYCES BOULARDII 250 MG PO CAPS
250.0000 mg | ORAL_CAPSULE | Freq: Two times a day (BID) | ORAL | Status: DC
  Administered 2017-03-29 – 2017-04-08 (×21): 250 mg via ORAL
  Filled 2017-03-28 (×21): qty 1

## 2017-03-28 MED ORDER — ONDANSETRON HCL 4 MG PO TABS: 4.0000 mg | ORAL_TABLET | Freq: Four times a day (QID) | ORAL | Status: DC | PRN

## 2017-03-28 MED ORDER — METOPROLOL SUCCINATE ER 25 MG PO TB24
37.5000 mg | ORAL_TABLET | Freq: Every day | ORAL | Status: DC
  Administered 2017-03-29 – 2017-03-30 (×2): 37.5 mg via ORAL
  Filled 2017-03-28 (×2): qty 1

## 2017-03-28 MED ORDER — SODIUM CHLORIDE 0.9 % IV BOLUS (SEPSIS)
250.0000 mL | Freq: Once | INTRAVENOUS | Status: AC
  Administered 2017-03-28: 250 mL via INTRAVENOUS

## 2017-03-28 NOTE — Progress Notes (Signed)
Pharmacy Antibiotic Note  Nicholas Porter is a 83 y.o. male with fever admitted on 03/28/2017 with sepsis.  Pharmacy has been consulted for zosyn/vancomycin dosing.  Plan: Zosyn 3.375 Gm x1 then 2.25 Gm IV q6h Vancomycin 1 Gm x1 then 750 mg IV q48h If Scr does not improve check Vanc R b/f next dose Daily Scr    No data recorded.   Recent Labs Lab 03/28/17 2140 03/28/17 2204  WBC 8.1  --   CREATININE 2.99*  --   LATICACIDVEN  --  2.69*    Estimated Creatinine Clearance: 14.5 mL/min (A) (by C-G formula based on SCr of 2.99 mg/dL (H)).    Allergies  Allergen Reactions  . Amlodipine Besylate Hives  . Hydralazine Itching  . Lipitor [Atorvastatin] Itching  . Naproxen Diarrhea  . Oxycodone-Acetaminophen Itching  . Isosorbide Rash    Antimicrobials this admission: 4/6 zosyn >>  4/6 vancomycin >>   Dose adjustments this admission:   Microbiology results:  BCx:   UCx:    Sputum:    MRSA PCR:   Thank you for allowing pharmacy to be a part of this patient's care.  Dorrene German 03/28/2017 10:54 PM

## 2017-03-28 NOTE — ED Notes (Signed)
Pt's daughter is leaving to take her husband home, states she will be back in about an hour. Daughter's name is Company secretary. Home # [phone] Cell# [phone] Wife of pt is staying here and can contact daughter by cell phone.

## 2017-03-28 NOTE — Telephone Encounter (Signed)
PATIENT MADE AWARE OF INFORMATION  RESULT OF MYOVIEW GIVEN . VERBALIZED UNDERSTANDING OF BOTH.

## 2017-03-28 NOTE — ED Notes (Signed)
Pt was placed on cardiac monitoring.

## 2017-03-28 NOTE — H&P (Signed)
History and Physical    Nicholas Porter GMW:[id number] DOB: [birth year] DOA: 03/28/2017  PCP: Nicholas Cower, Nicholas Porter   Patient coming from: Home via EMS  Chief Complaint: Fever, confusion, near syncope  HPI: Nicholas Porter is a 83 y.o. gentleman with a history of CAD S/P RCA stent (recent outpatient stress test low risk for ischemia), carotid artery disease S/P left CEA, HTN, CKD 3 (baseline creatinine around 1.6), and anemia of chronic kidney disease who was admitted from 3/4 - 3/9 for a Klebsiella UTI.  He was treated with IV Rocephin, then transitioned to oral cipro, which he completed as directed.  Today, EMS was called for new onset weakness, confusion, subjective fever at home, chills, and a near syncopal episode while up to the restroom.  He also had a single episode of explosive diarrhea, which was watery per his wife.  He has had nausea but no vomiting.  He reports urinary urgency but denies dysuria or odor.  En route to the ED, he received 400cc NS.  He had tachypnea and tachycardia.  ED Course: Rectal temp 104.  Lactic acid level 2.69.  Patient treated per sepsis protocol.  A total of 2750cc of NS ordered in the ED.  He has received vanc and zosyn.  Blood and urine cultures ordered.  WBC count 8.  Hgb 9.5.  Platelet 76 (appears to be new).  Creatinine 3 (almost twice his baseline).  Bicarb 13.  Hospitalist asked to admit.  Review of Systems: As per HPI otherwise 10 systems reviewed and negative.   Past Medical History:  Diagnosis Date  . Anemia of chronic renal failure, stage 4 (severe) ([place]) 11/27/2015  . 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 CAD S/P percutaneous coronary angioplasty - DES in RCA with PTCA for ISR; Moderate mLAD, 80% ostial OM2 stable 05/22/2008   Qualifier: Diagnosis of  By: Nicholas Hedges Nicholas Porter, Nicholas Porter   . CKD (chronic kidney disease) stage 3, GFR 30-59 ml/min   . Essential hypertension  06/30/2007   Qualifier: Diagnosis of  By: Nicholas Razor RN, Nicholas Porter    . H/O Inferior STEMI (09/2013) emergent PCI with Promus DES to RCA (3.0 mm  x 28 mm - 3.2 mm) 10/05/2013  . Left-sided carotid artery disease -- s/p CEA 04/06/2006   S/p Left CEA   . Mitral regurgitation - onexam 09/03/2016    Past Surgical History:  Procedure Laterality Date  . AMPUTATION     left index finger -tramatic  . CARDIAC CATHETERIZATION  November 2011   40% mid LAD, 50% proximal RCA, 30-40% distal RCA (DOMINANT RCA with wraparound PDA & 2 large PLBs), 60-70% ostial OM1. No change from 2001. Medical therapy  . CAROTID ENDARTERECTOMY Left 2007   Dr.Hayes: 08/2016 Dopplers: Stable less than 40% right internal carotid stenosis and stable moderate 40-60% left internal carotid stenosis. Patent vertebral and subclavian arteries.  . CARPAL TUNNEL RELEASE    . CORNEAL TRANSPLANT    . CORONARY ANGIOPLASTY WITH STENT PLACEMENT  09/2013; 05/2014    d) Promus DES 3.0 mm x 28 mm (3.2 mm)  to RCA with STEMI; residual 70% mid-distal LAD, OM 270-80%. Medical management; b) PTCA of 75 % ISR , LAD lesion noted as ~40%  . FOOT SURGERY     left  . HERNIA REPAIR    . LEFT HEART CATHETERIZATION WITH CORONARY ANGIOGRAM N/A 10/05/2013   Procedure: LEFT HEART CATHETERIZATION WITH CORONARY ANGIOGRAM;  Surgeon: Nicholas M Martinique, Nicholas Porter;  Location: [facility] CATH LAB;  Service: Cardiovascular;  RCA 100% - PCI,CxOM stable, LAD ~70% mid;  . LEFT HEART CATHETERIZATION WITH CORONARY ANGIOGRAM N/A 06/08/2014   Procedure: LEFT HEART CATHETERIZATION WITH CORONARY ANGIOGRAM;  Surgeon: Nicholas Sine, Nicholas Porter;  Location: [facility] CATH LAB;  Service: Cardiovascular;  UA 6/'15: 75% ISR in RCA - PTCA, LAD lesion ~40%, CxOM stable.    Marland Kitchen NM MYOVIEW LTD  11/2013; 08/1014   a) 12/'14: EF 59%, Fixed Inferior-Inferoapical Defect c/w Inferior Scar; No Ischemia; b) 9/'15:  9/'15: EF 55% - persistent fixed inferior-inferoapical perfusion defect  - read as diaphragmatic attenuation, likely  consistent with infarct/scar   . PARTIAL GASTRECTOMY    . PERCUTANEOUS CORONARY INTERVENTION-BALLOON ONLY  06/08/2014   Procedure: PERCUTANEOUS CORONARY INTERVENTION-BALLOON ONLY;  Surgeon: Nicholas Sine, Nicholas Porter;  Location: [facility] CATH LAB;  Service: Cardiovascular;;  . ROTATOR CUFF REPAIR     left  . SHOULDER ARTHROSCOPY WITH ROTATOR CUFF REPAIR AND SUBACROMIAL DECOMPRESSION Right 07/01/2013   Procedure: RIGHT SHOULDER ARTHROSCOPY WITH  SUBACROMIAL DECOMPRESSION/DISTAL CLAVICLE RESECTION/ROTATOR CUFF REPAIR ;  Surgeon: Marin Shutter, Nicholas Porter;  Location: [place];  Service: Orthopedics;  Laterality: Right;  . SPINE SURGERY  1985   cervical laminectomy  . TOTAL KNEE ARTHROPLASTY    . TRANSTHORACIC ECHOCARDIOGRAM  08/2016   Normal EF 55-60%. Mild to moderate aortic stenosis with moderate regurgitation. Mild MR.  . VAGOTOMY     partial gastrectomy     reports that he quit smoking about 52 years ago. His smoking use included Cigarettes. He has never used smokeless tobacco. He reports that he does not drink alcohol or use drugs.  Allergies  Allergen Reactions  . Amlodipine Besylate Hives  . Hydralazine Itching  . Lipitor [Atorvastatin] Itching  . Naproxen Diarrhea  . Oxycodone-Acetaminophen Itching  . Isosorbide Rash    Family History  Problem Relation Age of Onset  . Heart disease Sister   . Diabetes Sister      Prior to Admission medications   Medication Sig Start Date End Date Taking? Authorizing Provider  brinzolamide (AZOPT) 1 % ophthalmic suspension Place 1 drop into the left eye every 12 (twelve) hours.    Yes Historical Provider, Nicholas Porter  clopidogrel (PLAVIX) 75 MG tablet TAKE 1 TABLET BY MOUTH DAILY Patient taking differently: TAKE  75 MG BY MOUTH DAILY 03/17/17  Yes Nicholas Man, Nicholas Porter  ferrous sulfate 325 (65 FE) MG EC tablet Take 325 mg by mouth daily with breakfast.   Yes Historical Provider, Nicholas Porter  losartan (COZAAR) 50 MG tablet Take 1 tablet (50 mg total) by mouth daily. HOLD UNTIL FOLLOW  UP WITH PRIMARY CARE DOCTOR 02/28/17  Yes Nicholas Dubois, Nicholas Porter  metoprolol succinate (TOPROL-XL) 25 MG 24 hr tablet Take 1.5 tablets (37.5 mg total) by mouth daily. 03/11/17  Yes Biagio Borg, Nicholas Porter  nitroGLYCERIN (NITROSTAT) 0.4 MG SL tablet Place 1 tablet (0.4 mg total) under the tongue every 5 (five) minutes as needed. For chest pain. 01/30/16  Yes Nicholas Man, Nicholas Porter  saccharomyces boulardii (FLORASTOR) 250 MG capsule Take 1 capsule (250 mg total) by mouth 2 (two) times daily. 02/28/17  Yes Nicholas Dubois, Nicholas Porter  TRAVATAN Z 0.004 % SOLN ophthalmic solution Place 1 drop into both eyes at bedtime.  04/20/13  Yes Historical Provider, Nicholas Porter    Physical Exam: Vitals:   03/28/17 2145 03/28/17 2230 03/28/17 2300 03/28/17 2318  BP: (!) 180/60 (!) 182/75 (!) 174/72 (!) 174/72  Pulse:  100 (!) 102 (!) 104 (!) 108  Resp: (!) 36 (!) 34 (!) 41 18  Temp: (!) 104 F (40 C)     TempSrc: Rectal     SpO2: 98% 100% 97% 98%      Constitutional: NAD, calm, comfortable, but tachycardic and tachypnic; denies acute pain Vitals:   03/28/17 2145 03/28/17 2230 03/28/17 2300 03/28/17 2318  BP: (!) 180/60 (!) 182/75 (!) 174/72 (!) 174/72  Pulse: 100 (!) 102 (!) 104 (!) 108  Resp: (!) 36 (!) 34 (!) 41 18  Temp: (!) 104 F (40 C)     TempSrc: Rectal     SpO2: 98% 100% 97% 98%   Eyes: PERRL, lids and conjunctivae normal ENMT: Mucous membranes are moist. Posterior pharynx clear of any exudate or lesions. Normal dentition.  Neck: normal appearance, supple, no masses Respiratory: clear to auscultation bilaterally, no wheezing, no crackles. Normal respiratory effort. No accessory muscle use.  Cardiovascular: Tachycardic but regular.  No murmurs / rubs / gallops. No extremity edema. 2+ pedal pulses.   GI: abdomen is soft and compressible.  No distention.  No tenderness.  Bowel sounds are present. Musculoskeletal:  No joint deformity in upper and lower extremities. Good ROM, no contractures. Normal muscle tone.  Skin: no rashes, HOT  to touch but he is not diaphoretic Neurologic: No apparent focal deficits. Sensation intact, Generalized weakness.  Psychiatric: Normal judgment and insight. Alert and oriented to person and place only.  Normal mood.     Labs on Admission: I have personally reviewed following labs and imaging studies  CBC:  Recent Labs Lab 03/28/17 2140  WBC 8.1  NEUTROABS 7.8*  HGB 9.5*  HCT 30.0*  MCV 89.8  PLT 76*   Basic Metabolic Panel:  Recent Labs Lab 03/28/17 2140  NA 138  K 4.7  CL 115*  CO2 13*  GLUCOSE 197*  BUN 53*  CREATININE 2.99*  CALCIUM 8.5*   GFR: Estimated Creatinine Clearance: 14.5 mL/min (A) (by C-G formula based on SCr of 2.99 mg/dL (H)). Liver Function Tests:  Recent Labs Lab 03/28/17 2140  AST 30  ALT 11*  ALKPHOS 71  BILITOT 0.8  PROT 6.6  ALBUMIN 3.3*   Coagulation Profile:  Recent Labs Lab 03/28/17 2140  INR 1.26   Urine analysis: Urinalysis    Component Value Date/Time   COLORURINE YELLOW 03/28/2017 2357   APPEARANCEUR HAZY (A) 03/28/2017 2357   LABSPEC 1.016 03/28/2017 2357   PHURINE 5.0 03/28/2017 2357   GLUCOSEU NEGATIVE 03/28/2017 2357   GLUCOSEU NEGATIVE 03/11/2017 1605   HGBUR MODERATE (A) 03/28/2017 2357   HGBUR negative 12/06/2009 0836   BILIRUBINUR NEGATIVE 03/28/2017 2357   BILIRUBINUR negative 02/13/2017 1025   KETONESUR NEGATIVE 03/28/2017 2357   PROTEINUR 30 (A) 03/28/2017 2357   UROBILINOGEN 0.2 03/11/2017 1605   NITRITE NEGATIVE 03/28/2017 2357   LEUKOCYTESUR LARGE (A) 03/28/2017 2357      Sepsis Labs:  Lactic acid level 2.69  Radiological Exams on Admission: Dg Chest Port 1 View  Result Date: 03/28/2017 CLINICAL DATA:  Weakness and confusion tonight.  Near syncope. EXAM: PORTABLE CHEST 1 VIEW COMPARISON:  02/23/2017 FINDINGS: AP portable views of the chest demonstrate no focal airspace consolidation or alveolar edema. The lungs are grossly clear. There is no large effusion or pneumothorax. Cardiac and  mediastinal contours appear unremarkable. IMPRESSION: No active disease. Electronically Signed   By: Andreas Newport M.D.   On: 03/28/2017 22:09    EKG: Interpreted by me.  I  believe he has a sinus tachycardia.  Baseline is clouded by artifact but P waves are discernable in some leads and his QRS intervals are regular.  I do not think the patient is in atrial fibrillation at this time.  Assessment/Plan Principal Problem:   Sepsis ([place]) Active Problems:   Essential hypertension   CAD S/P percutaneous coronary angioplasty - DES in RCA with PTCA for ISR; Moderate mLAD, 80% ostial OM2 stable   Left-sided carotid artery disease -- s/p CEA   Thrombocytopenia (HCC)   Acute renal failure superimposed on stage 4 chronic kidney disease (HCC)   Acute metabolic encephalopathy   Fever      Sepsis secondary to UTI --Vanc and zosyn initiated in the ED --Blood and urine cultures pending --Follow lactic acid level --Check procalcitonin --Acetaminophen prn for fever  AKI on CKD 3 --Hold ARB --Hydrate --Renal ultrasound  HTN --Holding ARB due to AKI --Continue metoprolol --IV labetalol prn  CAD --Plavix --BB  Chronic anemia --On iron  Thrombocytopenia, suspect secondary to sepsis --Monitor for bleeding --Continue plavix for now  DVT prophylaxis: SCDs Code Status: FULL Family Communication: Wife present in the ED at time of admission. Disposition Plan: To be determined. Consults called: NONE Admission status: Inpatient, stepdown unit.  I expect this patient will need inpatient services for greater than two midnights.   TIME SPENT: 70 minutes   Eber Jones Nicholas Porter [facility] Pager [phone]  If 7PM-7AM, please contact night-coverage [email or web address] Password [email or web address]  03/28/2017, 11:41 PM

## 2017-03-28 NOTE — ED Provider Notes (Signed)
[facility] DEPT Provider Note   CSN: [id number] Arrival date & time: 03/28/17  2126     History   Chief Complaint Chief Complaint  Patient presents with  . Fever  . Code Sepsis    HPI Nicholas Porter is a 83 y.o. male.  Patient with onset genralized weakness and fever, onset today. Acute onset, symptoms moderate, perssitent. Had uti approx 2 weeks ago, no current gu symptoms.  Pt with weakness, mild confusion, and 'almost passed out'.  Pt does not know where fever is coming from. Denies headache. No cough or uri c/o. No sob. No abd pain. No vomiting or diarrhea. No rash/skin lesions/sores.    The history is provided by the patient.  Fever   Pertinent negatives include no chest pain, no diarrhea, no vomiting, no headaches, no sore throat and no cough.    Past Medical History:  Diagnosis Date  . Anemia of chronic renal failure, stage 4 (severe) ([place]) 11/27/2015  . 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 CAD S/P percutaneous coronary angioplasty - DES in RCA with PTCA for ISR; Moderate mLAD, 80% ostial OM2 stable 05/22/2008   Qualifier: Diagnosis of  By: Linda Hedges MD, Heinz Knuckles   . CKD (chronic kidney disease) stage 3, GFR 30-59 ml/min   . Essential hypertension 06/30/2007   Qualifier: Diagnosis of  By: Cori Razor RN, Mikal Plane    . H/O Inferior STEMI (09/2013) emergent PCI with Promus DES to RCA (3.0 mm  x 28 mm - 3.2 mm) 10/05/2013  . Left-sided carotid artery disease -- s/p CEA 04/06/2006   S/p Left CEA   . Mitral regurgitation - onexam 09/03/2016    Patient Active Problem List   Diagnosis Date Noted  . Chest pain with moderate risk for cardiac etiology 03/15/2017  . Benign prostatic hyperplasia 03/11/2017  . Weakness   . AKI (acute kidney injury) ([place]) 02/23/2017  . UTI (urinary tract infection) 02/23/2017  . Dyspnea on exertion 09/05/2016  . Mitral regurgitation - on exam 09/03/2016  .  Diarrhea of presumed infectious origin   . Malnutrition of moderate degree 11/29/2015  . Metabolic acidosis 62/13/0865  . Nausea vomiting and diarrhea 11/27/2015  . Acute renal failure superimposed on stage 4 chronic kidney disease ([place]) 11/27/2015  . Anemia of chronic renal failure, stage 4 (severe) ([place]) 11/27/2015  . Hyperkalemia 11/27/2015  . Dehydration 11/27/2015  . Protein-calorie malnutrition, severe ([place]) 11/27/2015  . Leukocytosis 11/27/2015  . GERD (gastroesophageal reflux disease) 01/24/2015  . Thrombocytopenia ([place]) 08/09/2014  . H/O Inferior STEMI (09/2013) emergent PCI with Promus DES to RCA (3.0 mm  x 28 mm - 3.2 mm) 10/05/2013  . 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 CAD S/P percutaneous coronary angioplasty - DES in RCA with PTCA for ISR; Moderate mLAD, 80% ostial OM2 stable 05/22/2008  . Essential hypertension 06/30/2007  . Left-sided carotid artery disease -- s/p CEA 04/06/2006    Class: History of    Past Surgical History:  Procedure Laterality Date  . AMPUTATION     left index finger -tramatic  . CARDIAC CATHETERIZATION  November 2011   40% mid LAD, 50% proximal RCA, 30-40% distal RCA (DOMINANT RCA with wraparound PDA & 2 large PLBs), 60-70% ostial OM1. No change from 2001. Medical therapy  . CAROTID ENDARTERECTOMY Left 2007   Dr.Hayes: 08/2016 Dopplers: Stable less than 40% right internal carotid stenosis and stable moderate 40-60% left internal carotid stenosis. Patent  vertebral and subclavian arteries.  . CARPAL TUNNEL RELEASE    . CORNEAL TRANSPLANT    . CORONARY ANGIOPLASTY WITH STENT PLACEMENT  09/2013; 05/2014    d) Promus DES 3.0 mm x 28 mm (3.2 mm)  to RCA with STEMI; residual 70% mid-distal LAD, OM 270-80%. Medical management; b) PTCA of 75 % ISR , LAD lesion noted as ~40%  . FOOT SURGERY     left  . HERNIA REPAIR    . LEFT HEART CATHETERIZATION WITH CORONARY ANGIOGRAM N/A 10/05/2013   Procedure: LEFT HEART CATHETERIZATION WITH CORONARY ANGIOGRAM;   Surgeon: Peter M Martinique, MD;  Location: [facility] CATH LAB;  Service: Cardiovascular;  RCA 100% - PCI,CxOM stable, LAD ~70% mid;  . LEFT HEART CATHETERIZATION WITH CORONARY ANGIOGRAM N/A 06/08/2014   Procedure: LEFT HEART CATHETERIZATION WITH CORONARY ANGIOGRAM;  Surgeon: Troy Sine, MD;  Location: [facility] CATH LAB;  Service: Cardiovascular;  UA 6/'15: 75% ISR in RCA - PTCA, LAD lesion ~40%, CxOM stable.    Marland Kitchen NM MYOVIEW LTD  11/2013; 08/1014   a) 12/'14: EF 59%, Fixed Inferior-Inferoapical Defect c/w Inferior Scar; No Ischemia; b) 9/'15:  9/'15: EF 55% - persistent fixed inferior-inferoapical perfusion defect  - read as diaphragmatic attenuation, likely consistent with infarct/scar   . PARTIAL GASTRECTOMY    . PERCUTANEOUS CORONARY INTERVENTION-BALLOON ONLY  06/08/2014   Procedure: PERCUTANEOUS CORONARY INTERVENTION-BALLOON ONLY;  Surgeon: Troy Sine, MD;  Location: [facility] CATH LAB;  Service: Cardiovascular;;  . ROTATOR CUFF REPAIR     left  . SHOULDER ARTHROSCOPY WITH ROTATOR CUFF REPAIR AND SUBACROMIAL DECOMPRESSION Right 07/01/2013   Procedure: RIGHT SHOULDER ARTHROSCOPY WITH  SUBACROMIAL DECOMPRESSION/DISTAL CLAVICLE RESECTION/ROTATOR CUFF REPAIR ;  Surgeon: Marin Shutter, MD;  Location: [facility];  Service: Orthopedics;  Laterality: Right;  . SPINE SURGERY  1985   cervical laminectomy  . TOTAL KNEE ARTHROPLASTY    . TRANSTHORACIC ECHOCARDIOGRAM  08/2016   Normal EF 55-60%. Mild to moderate aortic stenosis with moderate regurgitation. Mild MR.  . VAGOTOMY     partial gastrectomy       Home Medications    Prior to Admission medications   Medication Sig Start Date End Date Taking? Authorizing Provider  brinzolamide (AZOPT) 1 % ophthalmic suspension Place 1 drop into the left eye every 12 (twelve) hours.    Yes Historical Provider, MD  clopidogrel (PLAVIX) 75 MG tablet TAKE 1 TABLET BY MOUTH DAILY Patient taking differently: TAKE  75 MG BY MOUTH DAILY 03/17/17  Yes Leonie Man, MD  ferrous  sulfate 325 (65 FE) MG EC tablet Take 325 mg by mouth daily with breakfast.   Yes Historical Provider, MD  losartan (COZAAR) 50 MG tablet Take 1 tablet (50 mg total) by mouth daily. HOLD UNTIL FOLLOW UP WITH PRIMARY CARE DOCTOR 02/28/17  Yes Barton Dubois, MD  metoprolol succinate (TOPROL-XL) 25 MG 24 hr tablet Take 1.5 tablets (37.5 mg total) by mouth daily. 03/11/17  Yes Biagio Borg, MD  nitroGLYCERIN (NITROSTAT) 0.4 MG SL tablet Place 1 tablet (0.4 mg total) under the tongue every 5 (five) minutes as needed. For chest pain. 01/30/16  Yes Leonie Man, MD  saccharomyces boulardii (FLORASTOR) 250 MG capsule Take 1 capsule (250 mg total) by mouth 2 (two) times daily. 02/28/17  Yes Barton Dubois, MD  TRAVATAN Z 0.004 % SOLN ophthalmic solution Place 1 drop into both eyes at bedtime.  04/20/13  Yes Historical Provider, MD  ciprofloxacin (CIPRO) 500 MG tablet Take 1 tablet (  500 mg total) by mouth 2 (two) times daily. Patient not taking: Reported on 03/28/2017 02/28/17   Barton Dubois, MD  feeding supplement (BOOST / RESOURCE BREEZE) LIQD Take 1 Container by mouth 3 (three) times daily between meals. Patient not taking: Reported on 03/28/2017 02/28/17   Barton Dubois, MD  phenazopyridine (PYRIDIUM) 100 MG tablet Take 1 tablet (100 mg total) by mouth 2 (two) times daily. Patient not taking: Reported on 03/28/2017 02/28/17   Barton Dubois, MD    Family History Family History  Problem Relation Age of Onset  . Heart disease Sister   . Diabetes Sister     Social History Social History  Substance Use Topics  . Smoking status: Former Smoker    Types: Cigarettes    Quit date: 12/23/1964  . Smokeless tobacco: Never Used  . Alcohol use No     Allergies   Amlodipine besylate; Hydralazine; Lipitor [atorvastatin]; Naproxen; Oxycodone-acetaminophen; and Isosorbide   Review of Systems Review of Systems  Constitutional: Positive for fever.  HENT: Negative for sore throat.   Eyes: Negative for redness.    Respiratory: Negative for cough and shortness of breath.   Cardiovascular: Negative for chest pain.  Gastrointestinal: Negative for abdominal pain, diarrhea and vomiting.  Genitourinary: Negative for dysuria and flank pain.  Musculoskeletal: Negative for back pain and neck pain.  Skin: Negative for rash.  Neurological: Negative for headaches.  Hematological: Does not bruise/bleed easily.  Psychiatric/Behavioral: Negative for confusion.     Physical Exam Updated Vital Signs There were no vitals taken for this visit.  Physical Exam  Constitutional: He is oriented to person, place, and time. He appears well-developed and well-nourished. No distress.  Febrile.   HENT:  Mouth/Throat: Oropharynx is clear and moist.  Eyes: Conjunctivae are normal.  Neck: Neck supple. No tracheal deviation present.  No stiffness or rigidity  Cardiovascular: Normal rate, regular rhythm, normal heart sounds and intact distal pulses.  Exam reveals no gallop and no friction rub.   No murmur heard. Pulmonary/Chest: Effort normal and breath sounds normal. No accessory muscle usage. No respiratory distress.  Abdominal: Soft. Bowel sounds are normal. He exhibits no distension. There is no tenderness.  Genitourinary:  Genitourinary Comments: Normal external gu exam. No cva tenderness  Musculoskeletal: He exhibits no edema.  Neurological: He is alert and oriented to person, place, and time.  Skin: Skin is warm and dry. He is not diaphoretic.  Psychiatric: He has a normal mood and affect.  Nursing note and vitals reviewed.    ED Treatments / Results  Labs (all labs ordered are listed, but only abnormal results are displayed) Results for orders placed or performed during the hospital encounter of 03/28/17  Comprehensive metabolic panel  Result Value Ref Range   Sodium 138 135 - 145 mmol/L   Potassium 4.7 3.5 - 5.1 mmol/L   Chloride 115 (H) 101 - 111 mmol/L   CO2 13 (L) 22 - 32 mmol/L   Glucose, Bld 197  (H) 65 - 99 mg/dL   BUN 53 (H) 6 - 20 mg/dL   Creatinine, Ser 2.99 (H) 0.61 - 1.24 mg/dL   Calcium 8.5 (L) 8.9 - 10.3 mg/dL   Total Protein 6.6 6.5 - 8.1 g/dL   Albumin 3.3 (L) 3.5 - 5.0 g/dL   AST 30 15 - 41 U/L   ALT 11 (L) 17 - 63 U/L   Alkaline Phosphatase 71 38 - 126 U/L   Total Bilirubin 0.8 0.3 - 1.2 mg/dL   GFR  calc non Af Amer 18 (L) >60 mL/min   GFR calc Af Amer 20 (L) >60 mL/min   Anion gap 10 5 - 15  CBC with Differential  Result Value Ref Range   WBC 8.1 4.0 - 10.5 K/uL   RBC 3.34 (L) 4.22 - 5.81 MIL/uL   Hemoglobin 9.5 (L) 13.0 - 17.0 g/dL   HCT 30.0 (L) 39.0 - 52.0 %   MCV 89.8 78.0 - 100.0 fL   MCH 28.4 26.0 - 34.0 pg   MCHC 31.7 30.0 - 36.0 g/dL   RDW 21.0 (H) 11.5 - 15.5 %   Platelets 76 (L) 150 - 400 K/uL   Neutrophils Relative % 97 %   Lymphocytes Relative 2 %   Monocytes Relative 1 %   Eosinophils Relative 0 %   Basophils Relative 0 %   Neutro Abs 7.8 (H) 1.7 - 7.7 K/uL   Lymphs Abs 0.2 (L) 0.7 - 4.0 K/uL   Monocytes Absolute 0.1 0.1 - 1.0 K/uL   Eosinophils Absolute 0.0 0.0 - 0.7 K/uL   Basophils Absolute 0.0 0.0 - 0.1 K/uL   RBC Morphology ELLIPTOCYTES    Smear Review PLATELET COUNT CONFIRMED BY SMEAR   Protime-INR  Result Value Ref Range   Prothrombin Time 15.9 (H) 11.4 - 15.2 seconds   INR 1.26   I-Stat CG4 Lactic Acid, ED  Result Value Ref Range   Lactic Acid, Venous 2.69 (HH) 0.5 - 1.9 mmol/L   Comment NOTIFIED PHYSICIAN    Dg Chest Port 1 View  Result Date: 03/28/2017 CLINICAL DATA:  Weakness and confusion tonight.  Near syncope. EXAM: PORTABLE CHEST 1 VIEW COMPARISON:  02/23/2017 FINDINGS: AP portable views of the chest demonstrate no focal airspace consolidation or alveolar edema. The lungs are grossly clear. There is no large effusion or pneumothorax. Cardiac and mediastinal contours appear unremarkable. IMPRESSION: No active disease. Electronically Signed   By: Andreas Newport M.D.   On: 03/28/2017 22:09    EKG  EKG  Interpretation None       Radiology No results found.  Procedures Procedures (including critical care time)  Medications Ordered in ED Medications - No data to display   Initial Impression / Assessment and Plan / ED Course  I have reviewed the triage vital signs and the nursing notes.  Pertinent labs & imaging results that were available during my care of the patient were reviewed by me and considered in my medical decision making (see chart for details).  Iv ns. Cultures and labs. cxr. Continuous pulse ox and monitor.   Reviewed nursing notes and prior charts for additional history.   Lactate elevated, aki on labs. 30 cc/kg ns bolus.   After cultures sent. Iv abx given.  Will admit.   Additional ns bolus.  CRITICAL CARE  RE severe sepsis, elevated lactate, acute kidney injury.  Performed by: Mirna Mires Total critical care time: 45 minutes Critical care time was exclusive of separately billable procedures and treating other patients. Critical care was necessary to treat or prevent imminent or life-threatening deterioration. Critical care was time spent personally by me on the following activities: development of treatment plan with patient and/or surrogate as well as nursing, discussions with consultants, evaluation of patient's response to treatment, examination of patient, obtaining history from patient or surrogate, ordering and performing treatments and interventions, ordering and review of laboratory studies, ordering and review of radiographic studies, pulse oximetry and re-evaluation of patient's condition.  Hospitalists consulted for admission.   Final Clinical  Impressions(s) / ED Diagnoses   Final diagnoses:  None    New Prescriptions New Prescriptions   No medications on file     Lajean Saver, MD 03/28/17 2246

## 2017-03-28 NOTE — ED Triage Notes (Signed)
Pt from home via EMS.  Pt was here about two wks ago for UTI, took abx and finished last week.  A&Ox4, ambulatory.  About five hours ago pt became confused, weak, and starting shaking.  Family took pt to toilet and pt nearly passed out so family called EMS.  One episode diarrhea w/EMS.  Resp about 38-40/min.  169/72, 96% RA, NSR, 110 bpm, febrile, 18 G LAC, given about 400 ml NS

## 2017-03-28 NOTE — Telephone Encounter (Signed)
Patient contacted, results were given. He expressed understanding.

## 2017-03-28 NOTE — ED Notes (Signed)
Alerted MD Steinl to BP and temp.

## 2017-03-28 NOTE — ED Notes (Signed)
Bed: ZU36 Expected date:  Expected time:  Means of arrival:  Comments: 83 yr old possible sepsis

## 2017-03-28 NOTE — Telephone Encounter (Signed)
-----   Message from Biagio Borg, MD sent at 03/27/2017  6:29 PM EDT ----- Dajohn Ellender to let pt know- stress test negative

## 2017-03-28 NOTE — ED Notes (Signed)
Lactic Acid 2.69, MD Steinl notified

## 2017-03-29 ENCOUNTER — Inpatient Hospital Stay (HOSPITAL_COMMUNITY): Payer: Medicare Other

## 2017-03-29 DIAGNOSIS — R351 Nocturia: Secondary | ICD-10-CM

## 2017-03-29 DIAGNOSIS — N401 Enlarged prostate with lower urinary tract symptoms: Secondary | ICD-10-CM

## 2017-03-29 DIAGNOSIS — I10 Essential (primary) hypertension: Secondary | ICD-10-CM

## 2017-03-29 DIAGNOSIS — N183 Chronic kidney disease, stage 3 (moderate): Secondary | ICD-10-CM

## 2017-03-29 LAB — BLOOD CULTURE ID PANEL (REFLEXED)
Acinetobacter baumannii: NOT DETECTED
CANDIDA GLABRATA: NOT DETECTED
CANDIDA KRUSEI: NOT DETECTED
Candida albicans: NOT DETECTED
Candida parapsilosis: NOT DETECTED
Candida tropicalis: NOT DETECTED
Carbapenem resistance: NOT DETECTED
ENTEROCOCCUS SPECIES: NOT DETECTED
ESCHERICHIA COLI: NOT DETECTED
Enterobacter cloacae complex: NOT DETECTED
Enterobacteriaceae species: DETECTED — AB
Haemophilus influenzae: NOT DETECTED
KLEBSIELLA OXYTOCA: DETECTED — AB
Klebsiella pneumoniae: NOT DETECTED
LISTERIA MONOCYTOGENES: NOT DETECTED
Neisseria meningitidis: NOT DETECTED
PROTEUS SPECIES: NOT DETECTED
Pseudomonas aeruginosa: NOT DETECTED
SERRATIA MARCESCENS: NOT DETECTED
STAPHYLOCOCCUS SPECIES: NOT DETECTED
STREPTOCOCCUS PNEUMONIAE: NOT DETECTED
Staphylococcus aureus (BCID): NOT DETECTED
Streptococcus agalactiae: NOT DETECTED
Streptococcus pyogenes: NOT DETECTED
Streptococcus species: NOT DETECTED

## 2017-03-29 LAB — CBC WITH DIFFERENTIAL/PLATELET
BASOS ABS: 0 10*3/uL (ref 0.0–0.1)
Basophils Relative: 0 %
Eosinophils Absolute: 0 10*3/uL (ref 0.0–0.7)
Eosinophils Relative: 0 %
HCT: 29.5 % — ABNORMAL LOW (ref 39.0–52.0)
HEMOGLOBIN: 9.3 g/dL — AB (ref 13.0–17.0)
LYMPHS PCT: 2 %
Lymphs Abs: 0.3 10*3/uL — ABNORMAL LOW (ref 0.7–4.0)
MCH: 28.5 pg (ref 26.0–34.0)
MCHC: 31.5 g/dL (ref 30.0–36.0)
MCV: 90.5 fL (ref 78.0–100.0)
MONOS PCT: 6 %
Monocytes Absolute: 0.9 10*3/uL (ref 0.1–1.0)
Neutro Abs: 13.9 10*3/uL — ABNORMAL HIGH (ref 1.7–7.7)
Neutrophils Relative %: 92 %
Platelets: 53 10*3/uL — ABNORMAL LOW (ref 150–400)
RBC: 3.26 MIL/uL — AB (ref 4.22–5.81)
RDW: 21.3 % — ABNORMAL HIGH (ref 11.5–15.5)
WBC: 15.1 10*3/uL — ABNORMAL HIGH (ref 4.0–10.5)

## 2017-03-29 LAB — BASIC METABOLIC PANEL
ANION GAP: 7 (ref 5–15)
ANION GAP: 9 (ref 5–15)
BUN: 49 mg/dL — ABNORMAL HIGH (ref 6–20)
BUN: 60 mg/dL — ABNORMAL HIGH (ref 6–20)
CHLORIDE: 118 mmol/L — AB (ref 101–111)
CHLORIDE: 113 mmol/L — AB (ref 101–111)
CO2: 15 mmol/L — ABNORMAL LOW (ref 22–32)
CO2: 19 mmol/L — AB (ref 22–32)
Calcium: 7.7 mg/dL — ABNORMAL LOW (ref 8.9–10.3)
Calcium: 7.3 mg/dL — ABNORMAL LOW (ref 8.9–10.3)
Creatinine, Ser: 3.02 mg/dL — ABNORMAL HIGH (ref 0.61–1.24)
Creatinine, Ser: 3.64 mg/dL — ABNORMAL HIGH (ref 0.61–1.24)
GFR calc non Af Amer: 17 mL/min — ABNORMAL LOW (ref >60)
GFR calc non Af Amer: 14 mL/min — ABNORMAL LOW (ref >60)
GFR, EST AFRICAN AMERICAN: 20 mL/min — AB (ref >60)
GFR, EST AFRICAN AMERICAN: 16 mL/min — AB (ref >60)
GLUCOSE: 126 mg/dL — AB (ref 65–99)
Glucose, Bld: 124 mg/dL — ABNORMAL HIGH (ref 65–99)
POTASSIUM: 3.7 mmol/L (ref 3.5–5.1)
Potassium: 4.3 mmol/L (ref 3.5–5.1)
SODIUM: 140 mmol/L (ref 135–145)
Sodium: 141 mmol/L (ref 135–145)

## 2017-03-29 LAB — URINALYSIS, ROUTINE W REFLEX MICROSCOPIC
BILIRUBIN URINE: NEGATIVE
GLUCOSE, UA: NEGATIVE mg/dL
KETONES UR: NEGATIVE mg/dL
NITRITE: NEGATIVE
PROTEIN: 30 mg/dL — AB
Specific Gravity, Urine: 1.016 (ref 1.005–1.030)
pH: 5 (ref 5.0–8.0)

## 2017-03-29 LAB — C DIFFICILE QUICK SCREEN W PCR REFLEX
C DIFFICILE (CDIFF) INTERP: NOT DETECTED
C Diff antigen: NEGATIVE
C Diff toxin: NEGATIVE

## 2017-03-29 LAB — LACTIC ACID, PLASMA
LACTIC ACID, VENOUS: 2.6 mmol/L — AB (ref 0.5–1.9)
Lactic Acid, Venous: 2.4 mmol/L (ref 0.5–1.9)
Lactic Acid, Venous: 2.3 mmol/L (ref 0.5–1.9)

## 2017-03-29 LAB — TROPONIN I: Troponin I: 0.82 ng/mL (ref <0.03)

## 2017-03-29 LAB — PROCALCITONIN: Procalcitonin: >150 ng/mL

## 2017-03-29 LAB — MRSA PCR SCREENING: MRSA by PCR: NEGATIVE

## 2017-03-29 MED ORDER — LABETALOL HCL 5 MG/ML IV SOLN
INTRAVENOUS | Status: AC
  Administered 2017-03-29: 20 mg
  Filled 2017-03-29: qty 4

## 2017-03-29 MED ORDER — LABETALOL HCL 5 MG/ML IV SOLN
20.0000 mg | Freq: Once | INTRAVENOUS | Status: AC
  Administered 2017-03-29: 20 mg via INTRAVENOUS
  Filled 2017-03-29: qty 4

## 2017-03-29 MED ORDER — HYDRALAZINE HCL 20 MG/ML IJ SOLN
10.0000 mg | Freq: Four times a day (QID) | INTRAMUSCULAR | Status: DC | PRN
  Administered 2017-03-29: 10 mg via INTRAVENOUS
  Filled 2017-03-29: qty 1

## 2017-03-29 MED ORDER — LABETALOL HCL 5 MG/ML IV SOLN
20.0000 mg | Freq: Four times a day (QID) | INTRAVENOUS | Status: DC | PRN
  Administered 2017-03-30 – 2017-03-31 (×4): 20 mg via INTRAVENOUS
  Filled 2017-03-29 (×5): qty 4

## 2017-03-29 MED ORDER — TAMSULOSIN HCL 0.4 MG PO CAPS
0.4000 mg | ORAL_CAPSULE | Freq: Every day | ORAL | Status: DC
  Administered 2017-03-29 – 2017-04-07 (×10): 0.4 mg via ORAL
  Filled 2017-03-29 (×10): qty 1

## 2017-03-29 MED ORDER — SODIUM CHLORIDE 0.9 % IV BOLUS (SEPSIS)
1000.0000 mL | Freq: Once | INTRAVENOUS | Status: AC
  Administered 2017-03-29: 1000 mL via INTRAVENOUS

## 2017-03-29 MED ORDER — FUROSEMIDE 10 MG/ML IJ SOLN
30.0000 mg | Freq: Once | INTRAMUSCULAR | Status: AC
  Administered 2017-03-29: 30 mg via INTRAVENOUS
  Filled 2017-03-29: qty 4

## 2017-03-29 NOTE — Progress Notes (Signed)
PHARMACY - PHYSICIAN COMMUNICATION CRITICAL VALUE ALERT - BLOOD CULTURE IDENTIFICATION (BCID)  Results for orders placed or performed during the hospital encounter of 03/28/17  Blood Culture ID Panel (Reflexed) (Collected: 03/28/2017  9:40 PM)  Result Value Ref Range   Enterococcus species NOT DETECTED NOT DETECTED   Listeria monocytogenes NOT DETECTED NOT DETECTED   Staphylococcus species NOT DETECTED NOT DETECTED   Staphylococcus aureus NOT DETECTED NOT DETECTED   Streptococcus species NOT DETECTED NOT DETECTED   Streptococcus agalactiae NOT DETECTED NOT DETECTED   Streptococcus pneumoniae NOT DETECTED NOT DETECTED   Streptococcus pyogenes NOT DETECTED NOT DETECTED   Acinetobacter baumannii NOT DETECTED NOT DETECTED   Enterobacteriaceae species DETECTED (A) NOT DETECTED   Enterobacter cloacae complex NOT DETECTED NOT DETECTED   Escherichia coli NOT DETECTED NOT DETECTED   Klebsiella oxytoca DETECTED (A) NOT DETECTED   Klebsiella pneumoniae NOT DETECTED NOT DETECTED   Proteus species NOT DETECTED NOT DETECTED   Serratia marcescens NOT DETECTED NOT DETECTED   Carbapenem resistance NOT DETECTED NOT DETECTED   Haemophilus influenzae NOT DETECTED NOT DETECTED   Neisseria meningitidis NOT DETECTED NOT DETECTED   Pseudomonas aeruginosa NOT DETECTED NOT DETECTED   Candida albicans NOT DETECTED NOT DETECTED   Candida glabrata NOT DETECTED NOT DETECTED   Candida krusei NOT DETECTED NOT DETECTED   Candida parapsilosis NOT DETECTED NOT DETECTED   Candida tropicalis NOT DETECTED NOT DETECTED    Name of physician (or Provider) Contacted: [place]  Changes to prescribed antibiotics required: Stop vancomycin; continue Zosyn for now  Aneliese Beaudry A 03/29/2017  5:13 PM

## 2017-03-29 NOTE — Progress Notes (Signed)
[facility] PROGRESS NOTE  Nicholas Porter NWG:[id number] DOB: 1930-06-14 DOA: 03/28/2017 PCP: Cathlean Cower, MD  Interim summary and HPI 83 y.o.malewith medical history significant of CKD, HTN, CAD comes in with fever, confusion and near syncope event. Found to have sepsis due to UTI most likely. Still with elevated lactic acid and spiking fever.  Assessment/Plan: Sepsis due to Urinary tract infection, present on admission  -will follow cultures -continue broad spectrum antibiotic for now -will continue IVF's and supportive care -follow clinical repsosne  Acute kidney injury on chronic kidney disease stage III-IV -Baseline creatinine of 1.6, on admission 3.02 -will follow up Renal ultrasound  -holding nephrotoxic agents -continue IVF's and treatment for UTI -repeat BMP to follow renal function trend   Essential hypertension -Holding ARB due to acute kidney injury -Continue metoprolol (1.5 tablets for now) and PRN hydralazine for elevated BP  CAD -Currently stable, no complaints of CP  -Continue plavix and B-blocker  -recent outpatient stress test and low risk for ischemia  Chronic anemia of chronic renal failure -no signs of acute bleeding -repeat CBC to follow Hgb trend -continue ferrous sulfate    Diarrhea -will check for C. Diff -continue supportive care  Metabolic/Lactic acidosis -due to infection and acute on chronic renal failure -will continue IVF and bicarbonate -will follow lactic acid and renal function trend   Suspected BPH -patient decrease urine stream and also with nicturia -will start him on flomax  Code Status: Full Family Communication: no family at bedside  Disposition Plan: remains inpatient, continue IV antibiotics, follow cultures, continue IV fluids. Will follow clinical response and narrow treatment as available.   Consultants:  None   Procedures:  See below for x-ray reports   Antibiotics:  Vancomycin and zosyn  03/28/17  HPI/Subjective: No CP, spiking high fever overnight (even afebrile now). Patient is tachypneic and using oxygen supplementation. Still with elevated lactic acid.  Objective: Vitals:   03/29/17 0900 03/29/17 1000  BP: (!) 122/50 (!) 144/59  Pulse:    Resp: (!) 26 (!) 26  Temp:      Intake/Output Summary (Last 24 hours) at 03/29/17 1010 Last data filed at 03/29/17 1000  Gross per 24 hour  Intake              770 ml  Output              402 ml  Net              368 ml   Filed Weights   03/29/17 0110  Weight: 55.5 kg (122 lb 5.7 oz)    Exam:   General:  Afebrile, no CP, tachypnea and using [place] oxygen supplementation. Patient denies abd pain; endorses some dysuria and decrease in urine output. Per staff has required In and Out cath 2 times already.  Cardiovascular: S1 and S2, no rubs or gallops  Respiratory: tachypneic, no wheezing, no crackles  Abdomen: soft, positive BS, no guarding   Musculoskeletal: no edema or cyanosis   Data Reviewed: Basic Metabolic Panel:  Recent Labs Lab 03/28/17 2140 03/29/17 0332  NA 138 140  K 4.7 3.7  CL 115* 118*  CO2 13* 15*  GLUCOSE 197* 124*  BUN 53* 49*  CREATININE 2.99* 3.02*  CALCIUM 8.5* 7.7*   Liver Function Tests:  Recent Labs Lab 03/28/17 2140  AST 30  ALT 11*  ALKPHOS 71  BILITOT 0.8  PROT 6.6  ALBUMIN 3.3*   CBC:  Recent Labs Lab 03/28/17 2140 03/29/17 0332  WBC 8.1 15.1*  NEUTROABS 7.8* 13.9*  HGB 9.5* 9.3*  HCT 30.0* 29.5*  MCV 89.8 90.5  PLT 76* 53*   CBG: No results for input(s): GLUCAP in the last 168 hours.  Recent Results (from the past 240 hour(s))  MRSA PCR Screening     Status: None   Collection Time: 03/29/17  1:14 AM  Result Value Ref Range Status   MRSA by PCR NEGATIVE NEGATIVE Final    Comment:        The GeneXpert MRSA Assay (FDA approved for NASAL specimens only), is one component of a comprehensive MRSA colonization surveillance program. It is not intended to  diagnose MRSA infection nor to guide or monitor treatment for MRSA infections.      Studies: US Renal  Result Date: 03/29/2017 CLINICAL DATA:  Acute renal injury EXAM: RENAL / URINARY TRACT ULTRASOUND COMPLETE COMPARISON:  February 24, 2017 FINDINGS: Right Kidney: Length: 9 cm. Increased cortical echogenicity. No mass or hydronephrosis. Left Kidney: Length: 8.5 cm. Mild prominence of the central left renal collecting system as seen on image 26 without gross hydronephrosis. This was not visualized previously. Bladder: The bladder is decompressed and poorly evaluated but grossly unremarkable. IMPRESSION: Mild prominence of the central left renal collecting system not seen previously. No gross hydronephrosis or caliectasis. Recommend clinical correlation. Electronically Signed   By: Dorise Bullion III M.D   On: 03/29/2017 09:43   Dg Chest Port 1 View  Result Date: 03/28/2017 CLINICAL DATA:  Weakness and confusion tonight.  Near syncope. EXAM: PORTABLE CHEST 1 VIEW COMPARISON:  02/23/2017 FINDINGS: AP portable views of the chest demonstrate no focal airspace consolidation or alveolar edema. The lungs are grossly clear. There is no large effusion or pneumothorax. Cardiac and mediastinal contours appear unremarkable. IMPRESSION: No active disease. Electronically Signed   By: Andreas Newport M.D.   On: 03/28/2017 22:09    Scheduled Meds: . brinzolamide  1 drop Left Eye Q12H  . clopidogrel  75 mg Oral Daily  . ferrous sulfate  325 mg Oral Q breakfast  . labetalol  10 mg Intravenous Once  . latanoprost  1 drop Both Eyes QHS  . metoprolol succinate  37.5 mg Oral Daily  . piperacillin-tazobactam (ZOSYN)  IV  2.25 g Intravenous Q6H  . saccharomyces boulardii  250 mg Oral BID  . sodium chloride  1,000 mL Intravenous Once  . [START ON 03/30/2017] vancomycin  750 mg Intravenous Q48H   Continuous Infusions: .  sodium bicarbonate  infusion 1000 mL 75 mL/hr at 03/29/17 1000    Principal Problem:   Sepsis  ([place]) Active Problems:   Essential hypertension   CAD S/P percutaneous coronary angioplasty - DES in RCA with PTCA for ISR; Moderate mLAD, 80% ostial OM2 stable   Left-sided carotid artery disease -- s/p CEA   Thrombocytopenia (HCC)   Acute renal failure superimposed on stage 4 chronic kidney disease ([place])   Acute metabolic encephalopathy   Fever    Time spent: 25 minutes    Barton Dubois  [facility] Pager [phone]. If 7PM-7AM, please contact night-coverage at [email or web address], password [facility] 03/29/2017, 10:10 AM  LOS: 1 day

## 2017-03-29 NOTE — Progress Notes (Signed)
MD notified of temp 105.2 rectal.  Ice packs applied to underarms and groin area.  Will continue to monitor.

## 2017-03-29 NOTE — Progress Notes (Signed)
Pt's BP was running in the 132'G systolic.  PRN Hydralazine given.  Pt was assessed to be having some mild expiratory wheezes at that time.  Approximately 15-20 minutes later, pt's wife came to nurse's station and made RN aware that pt was having difficulty breathing.  RN immediately went bedside to assess.    Pt was actively wheezing, with increased work of breathing, and shortness of breath.  Pt was placed in a more upright position.  Pt began c/o chest tightness.  EKG was obtained.  Pt oxygen saturations decreased to the 70's, and pt was placed on a NRB.  HR increased to 120's, and pt remained hypertensive.  MD was immediately made aware of the change in the patient's status and of the results of the EKG.    PRN SL Nitroglycerin was administered, with no improvement.   STAT chest x-ray was ordered and obtained.  MD arrived at bedside.  Pt had no improvement in HR (remaining 120's-140's) or BP (SBP 190's-200's). MD ordered 20 mg Labatolol IV to be given. Orders executed. Pt HR went back to baseline of 80's-90's, SBP improved to 150's-160's. Rectal temperature was checked, 100 degrees F.  PRN Tylenol administered per MD order.  Oxygen saturations improved to 90's-100%.  Pt switched to 6 L nasal cannula.  Further orders by MD were placed, and will be executed.  Will continue to monitor.

## 2017-03-29 NOTE — Progress Notes (Signed)
Called by nurse to assess patient due to acute resp distress and chest tightness. BP running high and O2 sat down into the mid 70's. On arrival patient was shivering and with appreciated resp distress. He expressed chest tightness. EKG with ST changes on lateral leads (of note patient has recent cardiac work up as an outpatient, demonstrating low risk for ischemia).   Intervention: -NTG given and stat troponin and CXR ordered -labetalol IV given 20mg ; with this BP and HR normalizes and patient resp distress improved significantly. -he was placed on 6L [place] and was saturating in the high 90's -CXR results back demonstrating interstitial edema; IVF's stopped and lasix 30mg  IV ordered -rectal temp 100.2 F; tylenol given as well -discussed case and update wife at bedside; she rectify patient code status to be DNR/DNI. -will monitor closely and follow ordered blood work up. But after intervention, stable and in no distress.   Barton Dubois [phone]

## 2017-03-29 NOTE — Progress Notes (Signed)
Patient ID: Nicholas Porter, male   DOB: Apr 02, 1930, 83 y.o.   MRN: [id number]   Called by nursing secondary to critical troponin 0.82. Denies chest pain.  Likely demand ischemia in the setting of AKI on CKD. BP elevated Labetalol IV ordered x1.

## 2017-03-29 NOTE — Progress Notes (Signed)
Pt has still had very little urine output this shift.  Pt was bladder scanned again at 1600 with a 156 mL measured.  MD was made aware of findings.  MD stated not to in and out catheterize pt at this time and to continue to monitor.  If pt continues to have no urine output, perform bladder scans; if scan shows > or equal to 200 mL further action will have to be taken.  Will continue to monitor.

## 2017-03-30 ENCOUNTER — Inpatient Hospital Stay (HOSPITAL_COMMUNITY): Payer: Medicare Other

## 2017-03-30 DIAGNOSIS — R7889 Finding of other specified substances, not normally found in blood: Secondary | ICD-10-CM

## 2017-03-30 DIAGNOSIS — J9601 Acute respiratory failure with hypoxia: Secondary | ICD-10-CM

## 2017-03-30 DIAGNOSIS — R748 Abnormal levels of other serum enzymes: Secondary | ICD-10-CM

## 2017-03-30 LAB — BASIC METABOLIC PANEL
ANION GAP: 11 (ref 5–15)
BUN: 67 mg/dL — AB (ref 6–20)
CHLORIDE: 111 mmol/L (ref 101–111)
CO2: 19 mmol/L — ABNORMAL LOW (ref 22–32)
Calcium: 7.5 mg/dL — ABNORMAL LOW (ref 8.9–10.3)
Creatinine, Ser: 4.1 mg/dL — ABNORMAL HIGH (ref 0.61–1.24)
GFR calc Af Amer: 14 mL/min — ABNORMAL LOW (ref >60)
GFR, EST NON AFRICAN AMERICAN: 12 mL/min — AB (ref >60)
Glucose, Bld: 129 mg/dL — ABNORMAL HIGH (ref 65–99)
POTASSIUM: 4.5 mmol/L (ref 3.5–5.1)
Sodium: 141 mmol/L (ref 135–145)

## 2017-03-30 LAB — ECHOCARDIOGRAM COMPLETE
Height: 66 in
WEIGHTICAEL: 1957.68 [oz_av]

## 2017-03-30 LAB — LACTIC ACID, PLASMA: LACTIC ACID, VENOUS: 2.2 mmol/L — AB (ref 0.5–1.9)

## 2017-03-30 LAB — TROPONIN I
TROPONIN I: 0.84 ng/mL — AB (ref <0.03)
Troponin I: 0.93 ng/mL (ref <0.03)

## 2017-03-30 MED ORDER — CARVEDILOL 25 MG PO TABS
25.0000 mg | ORAL_TABLET | Freq: Two times a day (BID) | ORAL | Status: DC
  Administered 2017-03-30 – 2017-04-08 (×18): 25 mg via ORAL
  Filled 2017-03-30: qty 2
  Filled 2017-03-30 (×3): qty 1
  Filled 2017-03-30: qty 2
  Filled 2017-03-30 (×13): qty 1

## 2017-03-30 MED ORDER — IPRATROPIUM-ALBUTEROL 0.5-2.5 (3) MG/3ML IN SOLN: 3.0000 mL | RESPIRATORY_TRACT | Status: DC | PRN

## 2017-03-30 MED ORDER — DIPHENOXYLATE-ATROPINE 2.5-0.025 MG PO TABS
1.0000 | ORAL_TABLET | Freq: Four times a day (QID) | ORAL | Status: DC | PRN
  Administered 2017-04-01 – 2017-04-07 (×8): 1 via ORAL
  Filled 2017-03-30 (×11): qty 1

## 2017-03-30 MED ORDER — LIP MEDEX EX OINT
TOPICAL_OINTMENT | CUTANEOUS | Status: AC
  Filled 2017-03-30: qty 7

## 2017-03-30 MED ORDER — METOPROLOL TARTRATE 5 MG/5ML IV SOLN
5.0000 mg | Freq: Once | INTRAVENOUS | Status: AC
  Administered 2017-03-30: 5 mg via INTRAVENOUS
  Filled 2017-03-30: qty 5

## 2017-03-30 NOTE — Progress Notes (Signed)
RT called to see pt. for assessment for >'d WOB. found on 3 lpm n/c 99%. b/l b.s.clear upper lung fields and diminished bibasilar with only few scattered crackles heard upon auscultation w/o noted wheeze, Pt. alert & orientated, progress notes/Labs/Hx. reviewed, had similar episode on 03/29/2017 with Dr. Dyann Kief being notified with pt. given medication for elevated blood pressure, pt. also has hx. of urine retention, RN notified.

## 2017-03-30 NOTE — Progress Notes (Signed)
PT Cancellation Note  Patient Details Name: Nicholas Porter MRN: [id number] DOB: [birth year]   Cancelled Treatment:    Reason Eval/Treat Not Completed: Medical issues which prohibited therapy Pt on strict bedrest and also with high BPs. Please update activity level for PT to evaluate.  Thanks.   Marta Bouie,KATHrine E 03/30/2017, 9:25 AM Carmelia Bake, PT, DPT 03/30/2017 Pager: [phone]

## 2017-03-30 NOTE — Progress Notes (Signed)
[facility] PROGRESS NOTE  Nicholas Porter OIN:[id number] DOB: 1930/01/18 DOA: 03/28/2017 PCP: Cathlean Cower, MD  Interim summary and HPI 83 y.o.malewith medical history significant of CKD, HTN, CAD comes in with fever, confusion and near syncope event. Found to have sepsis due to UTI most likely. Still with elevated lactic acid and experienced acute resp distress. BP difficult to control on current therapy.  Assessment/Plan: Sepsis due to Urinary tract infection, present on admission  -will follow cultures -continue broad spectrum antibiotic (zosyn for now) -will continue supportive care -follow clinical repsosne  Acute kidney injury on chronic kidney disease stage III-IV -Baseline creatinine of 1.6, on admission 3.02 and now up to 4.10 -Renal ultrasound without hydronephrosis  -continue holding nephrotoxic agents -continue treatment for UTI -repeat BMP to follow renal function trend   Essential hypertension -Holding ARB due to acute kidney injury -Continue PRN labetalol -will d/c metoprolol and use coreg 25 mg/BID  CAD/elevated troponin  -Currently stable, no complaints of CP this morning; but with chest tightness complaints yesterday evening -troponin most likely elevated in presence of demand ischemia and acute renal failure.  -Continue plavix and B-blocker  -recent outpatient stress test and low risk for ischemia -2-D echo with global hypokinesis, EF 45-50%  Chronic anemia of chronic renal failure -no signs of acute bleeding -repeat CBC to follow Hgb trend -continue ferrous sulfate    Diarrhea -C. Diff neg -continue supportive care -started on flroastor -will use PRN lomotil as well   Metabolic/Lactic acidosis -due to infection and acute on chronic renal failure -will follow Bicarb and lactic acid -given resp distress and edema seen on CXR will stop IVF's. -will follow renal function trend  -bicarb 19  Suspected BPH -patient decrease urine stream and  also with nicturia -will continue flomax  Code Status: Full Family Communication: no family at bedside  Disposition Plan: remains inpatient, continue IV antibiotics, follow cultures, IV fluids on hold due to vascular congestion seen on CXR on 4/7 and resp distress. Will follow clinical response and narrow treatment as available. Follow lactic acid.   Consultants:  None   Procedures:  See below for x-ray reports   2-D echo - Left ventricle: The cavity size was normal. Wall thickness was   normal. Systolic function was mildly reduced. The estimated   ejection fraction was in the range of 45% to 50%. Diffuse   hypokinesis. Doppler parameters are consistent with abnormal left   ventricular relaxation (grade 1 diastolic dysfunction). Doppler   parameters are consistent with high ventricular filling pressure. - Aortic valve: Valve mobility was restricted. There was mild   stenosis. There was mild regurgitation. Valve area (VTI): 1.58   cm^2. Valve area (Vmax): 1.5 cm^2. Valve area (Vmean): 1.41 cm^2. - Mitral valve: Calcified annulus. There was mild to moderate   regurgitation. - Pulmonary arteries: Systolic pressure was moderately increased.   PA peak pressure: 59 mm Hg (S).  Impressions: - Mild global reduction in LV systolic function; grade 1 diastolic   dysfunction; elevated LV filling pressure; mild AS and AI; mild   to moderate MR; mild TR; moderately elevated pulmonary pressure.   Antibiotics:  Vancomycin 03/28/17>>03/29/17  Zosyn 03/28/17  HPI/Subjective: No CP, increase resp distress and chest discomfort spiking high fever overnight (even afebrile now). Patient is tachypneic and using oxygen supplementation. Still with elevated lactic acid, but much improved.  Objective: Vitals:   03/30/17 1010 03/30/17 1100  BP: (!) 203/85 (!) 179/82  Pulse: 72 80  Resp:  (!) 35  Temp:      Intake/Output Summary (Last 24 hours) at 03/30/17 1349 Last data filed at 03/30/17  0900  Gross per 24 hour  Intake            512.5 ml  Output              325 ml  Net            187.5 ml   Filed Weights   03/29/17 0110  Weight: 55.5 kg (122 lb 5.7 oz)    Exam:   General:  Afebrile, no CP, positive tachypnea, but requiring only 2L [place] oxygen supplementation. Patient denies abd pain; endorses some dysuria. BP continue to be elevated and at times difficult to control.  Able to pee on his own.   Cardiovascular: S1 and S2, no rubs or gallops  Respiratory: tachypneic, no wheezing, no crackles  Abdomen: soft, positive BS, no guarding   Musculoskeletal: no edema or cyanosis   Data Reviewed: Basic Metabolic Panel:  Recent Labs Lab 03/28/17 2140 03/29/17 0332 03/29/17 1913 03/30/17 0629  NA 138 140 141 141  K 4.7 3.7 4.3 4.5  CL 115* 118* 113* 111  CO2 13* 15* 19* 19*  GLUCOSE 197* 124* 126* 129*  BUN 53* 49* 60* 67*  CREATININE 2.99* 3.02* 3.64* 4.10*  CALCIUM 8.5* 7.7* 7.3* 7.5*   Liver Function Tests:  Recent Labs Lab 03/28/17 2140  AST 30  ALT 11*  ALKPHOS 71  BILITOT 0.8  PROT 6.6  ALBUMIN 3.3*   CBC:  Recent Labs Lab 03/28/17 2140 03/29/17 0332  WBC 8.1 15.1*  NEUTROABS 7.8* 13.9*  HGB 9.5* 9.3*  HCT 30.0* 29.5*  MCV 89.8 90.5  PLT 76* 53*   CBG: No results for input(s): GLUCAP in the last 168 hours.  Recent Results (from the past 240 hour(s))  Culture, blood (Routine x 2)     Status: None (Preliminary result)   Collection Time: 03/28/17  9:40 PM  Result Value Ref Range Status   Specimen Description BLOOD LAC  Final   Special Requests BOTTLES DRAWN AEROBIC AND ANAEROBIC BCAV  Final   Culture  Setup Time   Final    GRAM NEGATIVE RODS IN BOTH AEROBIC AND ANAEROBIC BOTTLES CRITICAL RESULT CALLED TO, READ BACK BY AND VERIFIED WITH: D. WOFFARD, [facility], 03/29/17 AT 1624 BY J FUDESCO    Culture   Final    GRAM NEGATIVE RODS CULTURE REINCUBATED FOR BETTER GROWTH Performed at [facility], [place] [address]  [place], [place] [address]    Report Status PENDING  Incomplete  Blood Culture ID Panel (Reflexed)     Status: Abnormal   Collection Time: 03/28/17  9:40 PM  Result Value Ref Range Status   Enterococcus species NOT DETECTED NOT DETECTED Final   Listeria monocytogenes NOT DETECTED NOT DETECTED Final   Staphylococcus species NOT DETECTED NOT DETECTED Final   Staphylococcus aureus NOT DETECTED NOT DETECTED Final   Streptococcus species NOT DETECTED NOT DETECTED Final   Streptococcus agalactiae NOT DETECTED NOT DETECTED Final   Streptococcus pneumoniae NOT DETECTED NOT DETECTED Final   Streptococcus pyogenes NOT DETECTED NOT DETECTED Final   Acinetobacter baumannii NOT DETECTED NOT DETECTED Final   Enterobacteriaceae species DETECTED (A) NOT DETECTED Final    Comment: Enterobacteriaceae represent a large family of gram-negative bacteria, not a single organism. CRITICAL RESULT CALLED TO, READ BACK BY AND VERIFIED WITH: D. WOFFARD, [facility], 03/29/17 AT 1623 BY J FUDESCO  Enterobacter cloacae complex NOT DETECTED NOT DETECTED Final   Escherichia coli NOT DETECTED NOT DETECTED Final   Klebsiella oxytoca DETECTED (A) NOT DETECTED Final    Comment: CRITICAL RESULT CALLED TO, READ BACK BY AND VERIFIED WITH: D. WOFFARD, [facility], 03/29/17 AT 1623 BY J FUDESCO    Klebsiella pneumoniae NOT DETECTED NOT DETECTED Final   Proteus species NOT DETECTED NOT DETECTED Final   Serratia marcescens NOT DETECTED NOT DETECTED Final   Carbapenem resistance NOT DETECTED NOT DETECTED Final   Haemophilus influenzae NOT DETECTED NOT DETECTED Final   Neisseria meningitidis NOT DETECTED NOT DETECTED Final   Pseudomonas aeruginosa NOT DETECTED NOT DETECTED Final   Candida albicans NOT DETECTED NOT DETECTED Final   Candida glabrata NOT DETECTED NOT DETECTED Final   Candida krusei NOT DETECTED NOT DETECTED Final   Candida parapsilosis NOT DETECTED NOT DETECTED Final   Candida tropicalis NOT DETECTED NOT DETECTED  Final    Comment: Performed at [facility], [place] [address], [place], [place] [address]  Culture, blood (Routine x 2)     Status: None (Preliminary result)   Collection Time: 03/28/17  9:45 PM  Result Value Ref Range Status   Specimen Description BLOOD RIGHT ARM  Final   Special Requests IN PEDIATRIC BOTTLE BCAV  Final   Culture  Setup Time   Final    GRAM NEGATIVE RODS IN PEDIATRIC BOTTLE CRITICAL VALUE NOTED.  VALUE IS CONSISTENT WITH PREVIOUSLY REPORTED AND CALLED VALUE.    Culture   Final    GRAM NEGATIVE RODS CULTURE REINCUBATED FOR BETTER GROWTH Performed at [facility], [place] [address] [place], [place] [address]    Report Status PENDING  Incomplete  Urine culture     Status: Abnormal (Preliminary result)   Collection Time: 03/28/17 11:57 PM  Result Value Ref Range Status   Specimen Description URINE, CLEAN CATCH  Final   Special Requests NONE  Final   Culture (A)  Final    >=100,000 COLONIES/mL KLEBSIELLA OXYTOCA SUSCEPTIBILITIES TO FOLLOW Performed at [facility], [place] [address], [place], [place] [address]    Report Status PENDING  Incomplete  MRSA PCR Screening     Status: None   Collection Time: 03/29/17  1:14 AM  Result Value Ref Range Status   MRSA by PCR NEGATIVE NEGATIVE Final    Comment:        The GeneXpert MRSA Assay (FDA approved for NASAL specimens only), is one component of a comprehensive MRSA colonization surveillance program. It is not intended to diagnose MRSA infection nor to guide or monitor treatment for MRSA infections.   C difficile quick scan w PCR reflex     Status: None   Collection Time: 03/29/17  3:08 PM  Result Value Ref Range Status   C Diff antigen NEGATIVE NEGATIVE Final   C Diff toxin NEGATIVE NEGATIVE Final   C Diff interpretation No C. difficile detected.  Final     Studies: US Renal  Result Date: 03/29/2017 CLINICAL DATA:  Acute renal injury EXAM: RENAL / URINARY TRACT ULTRASOUND COMPLETE COMPARISON:   February 24, 2017 FINDINGS: Right Kidney: Length: 9 cm. Increased cortical echogenicity. No mass or hydronephrosis. Left Kidney: Length: 8.5 cm. Mild prominence of the central left renal collecting system as seen on image 26 without gross hydronephrosis. This was not visualized previously. Bladder: The bladder is decompressed and poorly evaluated but grossly unremarkable. IMPRESSION: Mild prominence of the central left renal collecting system not  seen previously. No gross hydronephrosis or caliectasis. Recommend clinical correlation. Electronically Signed   By: Dorise Bullion III M.D   On: 03/29/2017 09:43   Dg Chest Port 1 View  Result Date: 03/29/2017 CLINICAL DATA:  Shortness of breath. History carneae artery disease, inferior STEMI and mitral regurgitation. History of cardiac catheterization. Former smoker. EXAM: PORTABLE CHEST 1 VIEW COMPARISON:  Chest x-rays dated 03/28/2017, 02/23/2017 and 07/29/2015 FINDINGS: Heart size is upper normal. Atherosclerotic changes noted at the aortic arch. There is new central pulmonary vascular congestion and hazy opacities at each lung base which likely represents associated interstitial edema. No significant pleural effusion. No pneumothorax seen. IMPRESSION: 1. Central pulmonary vascular congestion, and probable bibasilar interstitial edema, suggesting mild volume overload/CHF. 2.  Aortic atherosclerosis. Electronically Signed   By: Franki Cabot M.D.   On: 03/29/2017 18:16   Dg Chest Port 1 View  Result Date: 03/28/2017 CLINICAL DATA:  Weakness and confusion tonight.  Near syncope. EXAM: PORTABLE CHEST 1 VIEW COMPARISON:  02/23/2017 FINDINGS: AP portable views of the chest demonstrate no focal airspace consolidation or alveolar edema. The lungs are grossly clear. There is no large effusion or pneumothorax. Cardiac and mediastinal contours appear unremarkable. IMPRESSION: No active disease. Electronically Signed   By: Andreas Newport M.D.   On: 03/28/2017 22:09     Scheduled Meds: . brinzolamide  1 drop Left Eye Q12H  . carvedilol  25 mg Oral BID WC  . clopidogrel  75 mg Oral Daily  . ferrous sulfate  325 mg Oral Q breakfast  . latanoprost  1 drop Both Eyes QHS  . piperacillin-tazobactam (ZOSYN)  IV  2.25 g Intravenous Q6H  . saccharomyces boulardii  250 mg Oral BID  . tamsulosin  0.4 mg Oral QPC supper   Continuous Infusions: .  sodium bicarbonate  infusion 1000 mL Stopped (03/29/17 1750)    Principal Problem:   Sepsis ([place]) Active Problems:   Essential hypertension   CAD S/P percutaneous coronary angioplasty - DES in RCA with PTCA for ISR; Moderate mLAD, 80% ostial OM2 stable   Left-sided carotid artery disease -- s/p CEA   Thrombocytopenia ([facility])   Acute renal failure superimposed on stage 4 chronic kidney disease ([place])   Acute metabolic encephalopathy   Fever   Time spent: 25 minutes   Barton Dubois  [facility] Pager [phone]. If 7PM-7AM, please contact night-coverage at [email or web address], password [facility] 03/30/2017, 1:49 PM  LOS: 2 days

## 2017-03-30 NOTE — Progress Notes (Signed)
  Echocardiogram 2D Echocardiogram has been performed.  Nicholas Porter M 03/30/2017, 10:13 AM

## 2017-03-31 DIAGNOSIS — R7989 Other specified abnormal findings of blood chemistry: Secondary | ICD-10-CM | POA: Diagnosis present

## 2017-03-31 DIAGNOSIS — E860 Dehydration: Secondary | ICD-10-CM

## 2017-03-31 DIAGNOSIS — R0602 Shortness of breath: Secondary | ICD-10-CM

## 2017-03-31 LAB — URINE CULTURE: Culture: >=100000 — AB

## 2017-03-31 LAB — CULTURE, BLOOD (ROUTINE X 2)

## 2017-03-31 LAB — BASIC METABOLIC PANEL
ANION GAP: 12 (ref 5–15)
BUN: 85 mg/dL — AB (ref 6–20)
CHLORIDE: 111 mmol/L (ref 101–111)
CO2: 17 mmol/L — AB (ref 22–32)
Calcium: 7.5 mg/dL — ABNORMAL LOW (ref 8.9–10.3)
Creatinine, Ser: 4.55 mg/dL — ABNORMAL HIGH (ref 0.61–1.24)
GFR calc Af Amer: 12 mL/min — ABNORMAL LOW (ref >60)
GFR, EST NON AFRICAN AMERICAN: 11 mL/min — AB (ref >60)
GLUCOSE: 128 mg/dL — AB (ref 65–99)
POTASSIUM: 3.9 mmol/L (ref 3.5–5.1)
Sodium: 140 mmol/L (ref 135–145)

## 2017-03-31 LAB — LACTIC ACID, PLASMA: LACTIC ACID, VENOUS: 1 mmol/L (ref 0.5–1.9)

## 2017-03-31 MED ORDER — DEXTROSE 5 % IV SOLN
2.0000 g | INTRAVENOUS | Status: DC
  Administered 2017-03-31 – 2017-04-06 (×7): 2 g via INTRAVENOUS
  Filled 2017-03-31 (×9): qty 2

## 2017-03-31 MED ORDER — SODIUM BICARBONATE 650 MG PO TABS
650.0000 mg | ORAL_TABLET | Freq: Two times a day (BID) | ORAL | Status: DC
  Administered 2017-03-31 – 2017-04-08 (×16): 650 mg via ORAL
  Filled 2017-03-31 (×16): qty 1

## 2017-03-31 MED ORDER — PIPERACILLIN-TAZOBACTAM IN DEX 2-0.25 GM/50ML IV SOLN
2.2500 g | Freq: Three times a day (TID) | INTRAVENOUS | Status: DC
  Administered 2017-03-31: 2.25 g via INTRAVENOUS
  Filled 2017-03-31 (×2): qty 50

## 2017-03-31 MED ORDER — BUDESONIDE 0.25 MG/2ML IN SUSP
0.2500 mg | Freq: Two times a day (BID) | RESPIRATORY_TRACT | Status: DC
  Administered 2017-03-31 – 2017-04-07 (×10): 0.25 mg via RESPIRATORY_TRACT
  Filled 2017-03-31 (×15): qty 2

## 2017-03-31 MED ORDER — CLONIDINE HCL 0.1 MG PO TABS
0.1000 mg | ORAL_TABLET | Freq: Once | ORAL | Status: AC
  Administered 2017-03-31: 0.1 mg via ORAL
  Filled 2017-03-31: qty 1

## 2017-03-31 NOTE — Progress Notes (Signed)
[facility] PROGRESS NOTE  Nicholas Porter HYI:[id number] DOB: Oct 23, 1930 DOA: 03/28/2017 PCP: Cathlean Cower, MD  Interim summary and HPI 83 y.o.malewith medical history significant of CKD, HTN, CAD comes in with fever, confusion and near syncope event. Found to have sepsis due to UTI most likely. Still with elevated lactic acid and experienced acute resp distress. BP difficult to control on current therapy.  Assessment/Plan: Sepsis due to klebsiella Oxytoca Urinary tract infection and bacteremia -following cultures results will narrow antibiotics to rocephin -sepsis features resolving/improved. -will continue supportive care -follow clinical response  Acute kidney injury on chronic kidney disease stage III-IV -Baseline creatinine of 1.6, on admission 3.02 and now up to 4.55 -Renal ultrasound without hydronephrosis or obstructive uropathy  -continue holding nephrotoxic agents -continue treatment for UTI -repeat BMP to follow renal function trend; depending course will involved renal service   Essential hypertension -Holding ARB due to acute kidney injury -Continue PRN labetalol -will continue coreg 25 mg/BID -overall better, even still running high at times.  CAD/elevated troponin  -Currently stable, no complaints of CP this morning; but with chest tightness complaints yesterday evening -troponin most likely elevated in presence of demand ischemia and acute renal failure.  -Continue plavix and B-blocker  -recent outpatient stress test and low risk for ischemia -2-D echo with global hypokinesis (no focal wall abnormalities), EF 45-50%  Chronic anemia of chronic renal failure -no signs of acute bleeding -repeat CBC to follow Hgb trend -continue ferrous sulfate    Diarrhea -C. Diff neg -continue supportive care -started on flroastor -will use PRN lomotil as well   Metabolic/Lactic acidosis -due to infection and acute on chronic renal failure -will follow Bicarb and  lactic acid -given resp distress and edema seen on CXR will stop IVF's. -will follow renal function trend  -bicarb 17 today; will ad bicarb tablets   Suspected BPH -patient decrease urine stream and also with nicturia (prior to admission) -will continue flomax -had already follow up up arranged with urology service -renal US w/o obstructive abnormalities   Code Status: DNR Family Communication: no family at bedside  Disposition Plan: remains inpatient, continue IV antibiotics (now narrowed to Rocephin, base on cultures results); follow renal function and continue supportive care. Will move to telemetry bed and have PT evaluating him. Lactic acid WNL.  Consultants:  None   Procedures:  See below for x-ray reports   2-D echo - Left ventricle: The cavity size was normal. Wall thickness was   normal. Systolic function was mildly reduced. The estimated   ejection fraction was in the range of 45% to 50%. Diffuse   hypokinesis. Doppler parameters are consistent with abnormal left   ventricular relaxation (grade 1 diastolic dysfunction). Doppler   parameters are consistent with high ventricular filling pressure. - Aortic valve: Valve mobility was restricted. There was mild   stenosis. There was mild regurgitation. Valve area (VTI): 1.58   cm^2. Valve area (Vmax): 1.5 cm^2. Valve area (Vmean): 1.41 cm^2. - Mitral valve: Calcified annulus. There was mild to moderate   regurgitation. - Pulmonary arteries: Systolic pressure was moderately increased.   PA peak pressure: 59 mm Hg (S).  Impressions: - Mild global reduction in LV systolic function; grade 1 diastolic   dysfunction; elevated LV filling pressure; mild AS and AI; mild   to moderate MR; mild TR; moderately elevated pulmonary pressure.   Antibiotics:  Vancomycin 03/28/17>>03/29/17  Zosyn 03/28/17>>>03/31/17  Rocephin 03/31/17  HPI/Subjective: No CP, Patient is tachypneic and using oxygen supplementation. Still with elevated  lactic acid, but much improved.  Objective: Vitals:   03/31/17 0700 03/31/17 0800  BP: (!) 187/54   Pulse: 60   Resp: (!) 21   Temp:  97.8 F (36.6 C)    Intake/Output Summary (Last 24 hours) at 03/31/17 1344 Last data filed at 03/31/17 0542  Gross per 24 hour  Intake              300 ml  Output                0 ml  Net              300 ml   Filed Weights   03/29/17 0110  Weight: 55.5 kg (122 lb 5.7 oz)    Exam:   General:  Afebrile, breathing easier and denying CP. Still requiring only 2L [place] oxygen supplementation. Patient denies abd pain; endorses some dysuria. BP continue to be elevated and at times difficult to control (even overall improved).  Able to pee on his own and according to patient with increase in urinary output.   Cardiovascular: S1 and S2, no rubs or gallops  Respiratory: tachypneic, no wheezing, no crackles  Abdomen: soft, positive BS, no guarding   Musculoskeletal: no edema or cyanosis   Data Reviewed: Basic Metabolic Panel:  Recent Labs Lab 03/28/17 2140 03/29/17 0332 03/29/17 1913 03/30/17 0629 03/31/17 0422  NA 138 140 141 141 140  K 4.7 3.7 4.3 4.5 3.9  CL 115* 118* 113* 111 111  CO2 13* 15* 19* 19* 17*  GLUCOSE 197* 124* 126* 129* 128*  BUN 53* 49* 60* 67* 85*  CREATININE 2.99* 3.02* 3.64* 4.10* 4.55*  CALCIUM 8.5* 7.7* 7.3* 7.5* 7.5*   Liver Function Tests:  Recent Labs Lab 03/28/17 2140  AST 30  ALT 11*  ALKPHOS 71  BILITOT 0.8  PROT 6.6  ALBUMIN 3.3*   CBC:  Recent Labs Lab 03/28/17 2140 03/29/17 0332  WBC 8.1 15.1*  NEUTROABS 7.8* 13.9*  HGB 9.5* 9.3*  HCT 30.0* 29.5*  MCV 89.8 90.5  PLT 76* 53*   CBG: No results for input(s): GLUCAP in the last 168 hours.  Recent Results (from the past 240 hour(s))  Culture, blood (Routine x 2)     Status: Abnormal   Collection Time: 03/28/17  9:40 PM  Result Value Ref Range Status   Specimen Description BLOOD LAC  Final   Special Requests BOTTLES DRAWN AEROBIC AND  ANAEROBIC BCAV  Final   Culture  Setup Time   Final    GRAM NEGATIVE RODS IN BOTH AEROBIC AND ANAEROBIC BOTTLES CRITICAL RESULT CALLED TO, READ BACK BY AND VERIFIED WITH: D. WOFFARD, PH[facility] AT [facility], 03/29/17 AT 1624 BY J FUDESCO Performed at [facility], [address] [place], [place] [address]    Culture KLEBSIELLA OXYTOCA (A)  Final   Report Status 03/31/2017 FINAL  Final   Organism ID, Bacteria KLEBSIELLA OXYTOCA  Final      Susceptibility   Klebsiella oxytoca - MIC*    AMPICILLIN >=32 RESISTANT Resistant     CEFAZOLIN 8 SENSITIVE Sensitive     CEFEPIME <=1 SENSITIVE Sensitive     CEFTAZIDIME <=1 SENSITIVE Sensitive     CEFTRIAXONE <=1 SENSITIVE Sensitive     CIPROFLOXACIN <=0.25 SENSITIVE Sensitive     GENTAMICIN <=1 SENSITIVE Sensitive     IMIPENEM <=0.25 SENSITIVE Sensitive     TRIMETH/SULFA <=20 SENSITIVE Sensitive     AMPICILLIN/SULBACTAM 16 INTERMEDIATE Intermediate  PIP/TAZO <=4 SENSITIVE Sensitive     Extended ESBL NEGATIVE Sensitive     * KLEBSIELLA OXYTOCA  Blood Culture ID Panel (Reflexed)     Status: Abnormal   Collection Time: 03/28/17  9:40 PM  Result Value Ref Range Status   Enterococcus species NOT DETECTED NOT DETECTED Final   Listeria monocytogenes NOT DETECTED NOT DETECTED Final   Staphylococcus species NOT DETECTED NOT DETECTED Final   Staphylococcus aureus NOT DETECTED NOT DETECTED Final   Streptococcus species NOT DETECTED NOT DETECTED Final   Streptococcus agalactiae NOT DETECTED NOT DETECTED Final   Streptococcus pneumoniae NOT DETECTED NOT DETECTED Final   Streptococcus pyogenes NOT DETECTED NOT DETECTED Final   Acinetobacter baumannii NOT DETECTED NOT DETECTED Final   Enterobacteriaceae species DETECTED (A) NOT DETECTED Final    Comment: Enterobacteriaceae represent a large family of gram-negative bacteria, not a single organism. CRITICAL RESULT CALLED TO, READ BACK BY AND VERIFIED WITH: D. WOFFARD, [facility], 03/29/17 AT 1623 BY J FUDESCO     Enterobacter cloacae complex NOT DETECTED NOT DETECTED Final   Escherichia coli NOT DETECTED NOT DETECTED Final   Klebsiella oxytoca DETECTED (A) NOT DETECTED Final    Comment: CRITICAL RESULT CALLED TO, READ BACK BY AND VERIFIED WITH: D. WOFFARD, [facility], 03/29/17 AT 1623 BY J FUDESCO    Klebsiella pneumoniae NOT DETECTED NOT DETECTED Final   Proteus species NOT DETECTED NOT DETECTED Final   Serratia marcescens NOT DETECTED NOT DETECTED Final   Carbapenem resistance NOT DETECTED NOT DETECTED Final   Haemophilus influenzae NOT DETECTED NOT DETECTED Final   Neisseria meningitidis NOT DETECTED NOT DETECTED Final   Pseudomonas aeruginosa NOT DETECTED NOT DETECTED Final   Candida albicans NOT DETECTED NOT DETECTED Final   Candida glabrata NOT DETECTED NOT DETECTED Final   Candida krusei NOT DETECTED NOT DETECTED Final   Candida parapsilosis NOT DETECTED NOT DETECTED Final   Candida tropicalis NOT DETECTED NOT DETECTED Final    Comment: Performed at [facility], [address], [place], [place] [address]  Culture, blood (Routine x 2)     Status: Abnormal   Collection Time: 03/28/17  9:45 PM  Result Value Ref Range Status   Specimen Description BLOOD RIGHT ARM  Final   Special Requests IN PEDIATRIC BOTTLE BCAV  Final   Culture  Setup Time   Final    GRAM NEGATIVE RODS IN PEDIATRIC BOTTLE CRITICAL VALUE NOTED.  VALUE IS CONSISTENT WITH PREVIOUSLY REPORTED AND CALLED VALUE.    Culture (A)  Final    KLEBSIELLA OXYTOCA SUSCEPTIBILITIES PERFORMED ON PREVIOUS CULTURE WITHIN THE LAST 5 DAYS. Performed at [facility], [place] [address] [place], [place] [address]    Report Status 03/31/2017 FINAL  Final  Urine culture     Status: Abnormal   Collection Time: 03/28/17 11:57 PM  Result Value Ref Range Status   Specimen Description URINE, CLEAN CATCH  Final   Special Requests NONE  Final   Culture >=100,000 COLONIES/mL KLEBSIELLA OXYTOCA (A)  Final   Report Status 03/31/2017  FINAL  Final   Organism ID, Bacteria KLEBSIELLA OXYTOCA (A)  Final      Susceptibility   Klebsiella oxytoca - MIC*    AMPICILLIN >=32 RESISTANT Resistant     CEFAZOLIN 8 SENSITIVE Sensitive     CEFTRIAXONE <=1 SENSITIVE Sensitive     CIPROFLOXACIN <=0.25 SENSITIVE Sensitive     GENTAMICIN <=1 SENSITIVE Sensitive     IMIPENEM <=0.25 SENSITIVE Sensitive  NITROFURANTOIN 32 SENSITIVE Sensitive     TRIMETH/SULFA <=20 SENSITIVE Sensitive     AMPICILLIN/SULBACTAM 16 INTERMEDIATE Intermediate     PIP/TAZO <=4 SENSITIVE Sensitive     Extended ESBL NEGATIVE Sensitive     * >=100,000 COLONIES/mL KLEBSIELLA OXYTOCA  MRSA PCR Screening     Status: None   Collection Time: 03/29/17  1:14 AM  Result Value Ref Range Status   MRSA by PCR NEGATIVE NEGATIVE Final    Comment:        The GeneXpert MRSA Assay (FDA approved for NASAL specimens only), is one component of a comprehensive MRSA colonization surveillance program. It is not intended to diagnose MRSA infection nor to guide or monitor treatment for MRSA infections.   C difficile quick scan w PCR reflex     Status: None   Collection Time: 03/29/17  3:08 PM  Result Value Ref Range Status   C Diff antigen NEGATIVE NEGATIVE Final   C Diff toxin NEGATIVE NEGATIVE Final   C Diff interpretation No C. difficile detected.  Final     Studies: Dg Chest Port 1 View  Result Date: 03/29/2017 CLINICAL DATA:  Shortness of breath. History carneae artery disease, inferior STEMI and mitral regurgitation. History of cardiac catheterization. Former smoker. EXAM: PORTABLE CHEST 1 VIEW COMPARISON:  Chest x-rays dated 03/28/2017, 02/23/2017 and 07/29/2015 FINDINGS: Heart size is upper normal. Atherosclerotic changes noted at the aortic arch. There is new central pulmonary vascular congestion and hazy opacities at each lung base which likely represents associated interstitial edema. No significant pleural effusion. No pneumothorax seen. IMPRESSION: 1. Central  pulmonary vascular congestion, and probable bibasilar interstitial edema, suggesting mild volume overload/CHF. 2.  Aortic atherosclerosis. Electronically Signed   By: Franki Cabot M.D.   On: 03/29/2017 18:16    Scheduled Meds: . brinzolamide  1 drop Left Eye Q12H  . budesonide (PULMICORT) nebulizer solution  0.25 mg Nebulization BID  . carvedilol  25 mg Oral BID WC  . clopidogrel  75 mg Oral Daily  . ferrous sulfate  325 mg Oral Q breakfast  . latanoprost  1 drop Both Eyes QHS  . piperacillin-tazobactam (ZOSYN)  IV  2.25 g Intravenous Q8H  . saccharomyces boulardii  250 mg Oral BID  . tamsulosin  0.4 mg Oral QPC supper   Continuous Infusions: .  sodium bicarbonate  infusion 1000 mL Stopped (03/29/17 1750)    Principal Problem:   Sepsis ([facility]) Active Problems:   Essential hypertension   CAD S/P percutaneous coronary angioplasty - DES in RCA with PTCA for ISR; Moderate mLAD, 80% ostial OM2 stable   Left-sided carotid artery disease -- s/p CEA   Thrombocytopenia (HCC)   Acute renal failure superimposed on stage 4 chronic kidney disease ([facility]CC)   Acute metabolic encephalopathy   Fever   Elevated lactic acid level   Time spent: 25 minutes   Barton Dubois  [facility] Pager [phone]. If 7PM-7AM, please contact night-coverage at [email or web address], password [facility] 03/31/2017, 1:44 PM  LOS: 3 days

## 2017-03-31 NOTE — Progress Notes (Addendum)
Pharmacy Antibiotic Note  Nicholas Porter is a 83 y.o. male with fever admitted on 03/28/2017 with sepsis.  Pharmacy has been consulted for zosyn/vancomycin dosing. Vancomycin was stopped with isolation of klebsiella in blood cx.   Today, 03/31/2017 Day #3 antibiotics  Worsening AKI  No WBC today  Afebrile  Urine cx susceptibilities - R to amp and I to ampsulb  Plan:  Zosyn  2.25 Gm IV q8h for worsening renal function and documented decrease UOP  Based on culture susceptibilities, suggest narrow to ceftriaxone 2gm IV q24h.  Height: 5\' 6"  (167.6 cm) Weight: 122 lb 5.7 oz (55.5 kg) IBW/kg (Calculated) : 63.8  Temp (24hrs), Avg:98.1 F (36.7 C), Min:97.8 F (36.6 C), Max:98.3 F (36.8 C)   Recent Labs Lab 03/28/17 2140  03/29/17 0332 03/29/17 0650 03/29/17 1314 03/29/17 1913 03/30/17 0629 03/31/17 0422  WBC 8.1  --  15.1*  --   --   --   --   --   CREATININE 2.99*  --  3.02*  --   --  3.64* 4.10* 4.55*  LATICACIDVEN  --   < > 2.6* 2.4* 2.3*  --  2.2* 1.0  < > = values in this interval not displayed.  Estimated Creatinine Clearance: 9.3 mL/min (A) (by C-G formula based on SCr of 4.55 mg/dL (H)).    Allergies  Allergen Reactions  . Amlodipine Besylate Hives  . Hydralazine Itching  . Lipitor [Atorvastatin] Itching  . Naproxen Diarrhea  . Oxycodone-Acetaminophen Itching  . Isosorbide Rash    Antimicrobials this admission: 4/6 Vanc >>4/7 4/6 Zosyn >>  Dose adjustments this admission:  Microbiology results: 4/6 BCx: Klebsiella oxytoca (BCID + Klebsiella) 2 of 2 4/6 UCx: >100K Klebsiella oxytoca (R to amp, I amp/sulb) 4/6 Cdiff: negative 4/7 MRSA PCR: neg  Thank you for allowing pharmacy to be a part of this patient's care.  Doreene Eland, PharmD, BCPS.   Pager: [phone] 03/31/2017 9:27 AM

## 2017-03-31 NOTE — Evaluation (Signed)
Physical Therapy Evaluation Patient Details Name: Nicholas Porter MRN: [id number] DOB: 06-Jun-1930 Today's Date: 03/31/2017   History of Present Illness  Pt admitted with confusion and near syncope and dx with sepsis 2* UTI.  Pt with hx of DKC, HTN, CAD and MI  Clinical Impression  Pt admitted as above and presenting with functional mobility limitations 2* generalized weakness and balance deficits.  Pt hopes for dc home and would benefit from follow up HHPT to further address deficits.    Follow Up Recommendations Home health PT    Equipment Recommendations  None recommended by PT    Recommendations for Other Services OT consult     Precautions / Restrictions Precautions Precautions: Fall Restrictions Weight Bearing Restrictions: No      Mobility  Bed Mobility Overal bed mobility: Needs Assistance Bed Mobility: Supine to Sit     Supine to sit: Min assist     General bed mobility comments: Increased time and min assist to complete transition to EOB sitting  Transfers Overall transfer level: Needs assistance Equipment used: Rolling walker (2 wheeled) Transfers: Sit to/from Stand Sit to Stand: Min assist;From elevated surface         General transfer comment: cues for safe transition position and use of UEs to self assist  Ambulation/Gait Ambulation/Gait assistance: Min assist;+2 safety/equipment Ambulation Distance (Feet): 30 Feet Assistive device: Rolling walker (2 wheeled) Gait Pattern/deviations: Step-through pattern;Decreased step length - right;Decreased step length - left;Shuffle;Trunk flexed Gait velocity: decr Gait velocity interpretation: Below normal speed for age/gender General Gait Details: Cues for posture and position from RW.  Multiple short rests to complete task  Stairs            Wheelchair Mobility    Modified Rankin (Stroke Patients Only)       Balance Overall balance assessment: Needs assistance Sitting-balance support: Feet  supported;No upper extremity supported Sitting balance-Leahy Scale: Fair     Standing balance support: Bilateral upper extremity supported Standing balance-Leahy Scale: Poor                               Pertinent Vitals/Pain Pain Assessment: No/denies pain    Home Living Family/patient expects to be discharged to:: Private residence Living Arrangements: Spouse/significant other Available Help at Discharge: Family Type of Home: House Home Access: Stairs to enter Entrance Stairs-Rails: Right Entrance Stairs-Number of Steps: 2 Home Layout: One level Home Equipment: Environmental consultant - 2 wheels;Cane - single point;Bedside commode      Prior Function Level of Independence: Independent               Hand Dominance        Extremity/Trunk Assessment   Upper Extremity Assessment Upper Extremity Assessment: Generalized weakness    Lower Extremity Assessment Lower Extremity Assessment: Generalized weakness       Communication   Communication: No difficulties  Cognition Arousal/Alertness: Awake/alert Behavior During Therapy: WFL for tasks assessed/performed Overall Cognitive Status: Within Functional Limits for tasks assessed                                        General Comments      Exercises     Assessment/Plan    PT Assessment Patient needs continued PT services  PT Problem List Decreased strength;Decreased activity tolerance;Decreased balance;Decreased mobility;Decreased knowledge of use of DME  PT Treatment Interventions DME instruction;Gait training;Stair training;Functional mobility training;Therapeutic activities;Therapeutic exercise;Patient/family education    PT Goals (Current goals can be found in the Care Plan section)  Acute Rehab PT Goals Patient Stated Goal: HOME PT Goal Formulation: With patient Time For Goal Achievement: 04/12/17 Potential to Achieve Goals: Fair    Frequency Min 3X/week   Barriers to  discharge        Co-evaluation               End of Session Equipment Utilized During Treatment: Gait belt;Oxygen Activity Tolerance: Patient tolerated treatment well;Patient limited by fatigue Patient left: in chair;with call bell/phone within reach;with chair alarm set;with family/visitor present Nurse Communication: Mobility status PT Visit Diagnosis: Unsteadiness on feet (R26.81);Muscle weakness (generalized) (M62.81);Difficulty in walking, not elsewhere classified (R26.2)    Time: 4315-4008 PT Time Calculation (min) (ACUTE ONLY): 23 min   Charges:   PT Evaluation $PT Eval Low Complexity: 1 Procedure PT Treatments $Gait Training: 8-22 mins   PT G Codes:        Pg [phone]  Nicholas Porter 03/31/2017, 12:55 PM

## 2017-04-01 LAB — BASIC METABOLIC PANEL
ANION GAP: 11 (ref 5–15)
BUN: 99 mg/dL — ABNORMAL HIGH (ref 6–20)
CALCIUM: 7.9 mg/dL — AB (ref 8.9–10.3)
CO2: 18 mmol/L — AB (ref 22–32)
Chloride: 113 mmol/L — ABNORMAL HIGH (ref 101–111)
Creatinine, Ser: 5.03 mg/dL — ABNORMAL HIGH (ref 0.61–1.24)
GFR, EST AFRICAN AMERICAN: 11 mL/min — AB (ref >60)
GFR, EST NON AFRICAN AMERICAN: 9 mL/min — AB (ref >60)
Glucose, Bld: 112 mg/dL — ABNORMAL HIGH (ref 65–99)
POTASSIUM: 3.6 mmol/L (ref 3.5–5.1)
Sodium: 142 mmol/L (ref 135–145)

## 2017-04-01 LAB — IRON AND TIBC
IRON: 21 ug/dL — AB (ref 45–182)
Saturation Ratios: 9 % — ABNORMAL LOW (ref 17.9–39.5)
TIBC: 230 ug/dL — AB (ref 250–450)
UIBC: 209 ug/dL

## 2017-04-01 LAB — URINALYSIS, ROUTINE W REFLEX MICROSCOPIC
Bilirubin Urine: NEGATIVE
Glucose, UA: NEGATIVE mg/dL
Ketones, ur: NEGATIVE mg/dL
Leukocytes, UA: NEGATIVE
Nitrite: NEGATIVE
Protein, ur: NEGATIVE mg/dL
SPECIFIC GRAVITY, URINE: 1.013 (ref 1.005–1.030)
Squamous Epithelial / LPF: NONE SEEN
pH: 5 (ref 5.0–8.0)

## 2017-04-01 LAB — FERRITIN: Ferritin: 182 ng/mL (ref 24–336)

## 2017-04-01 MED ORDER — CLONIDINE HCL 0.1 MG PO TABS: 0.1000 mg | ORAL_TABLET | ORAL | Status: DC | PRN

## 2017-04-01 MED ORDER — FUROSEMIDE 80 MG PO TABS
160.0000 mg | ORAL_TABLET | Freq: Two times a day (BID) | ORAL | Status: DC
  Administered 2017-04-01 – 2017-04-03 (×4): 160 mg via ORAL
  Filled 2017-04-01: qty 4
  Filled 2017-04-01 (×2): qty 2
  Filled 2017-04-01: qty 4

## 2017-04-01 NOTE — Progress Notes (Signed)
[facility] PROGRESS NOTE  Nicholas Porter DDU:[id number] DOB: 05/25/1930 DOA: 03/28/2017 PCP: Cathlean Cower, MD  Interim summary and HPI 83 y.o.malewith medical history significant of CKD, HTN, CAD comes in with fever, confusion and near syncope event. Found to have sepsis due to UTI most likely. Still with elevated lactic acid and experienced acute resp distress. BP difficult to control on current therapy.  Assessment/Plan: Sepsis due to klebsiella Oxytoca Urinary tract infection and bacteremia -following cultures results will narrow antibiotics to rocephin; one more day of IV antibiotics and if remained stable will then switched to PO and complete a total of 10 days treatment. -sepsis features resolving/improved. -will continue supportive care -follow clinical response  Acute kidney injury on chronic kidney disease stage III-IV -Baseline creatinine of 1.6, on admission 3.02 and now up to 4.55 -Renal ultrasound without hydronephrosis or obstructive uropathy  -continue holding nephrotoxic agents -continue treatment for UTI -repeat BMP to follow renal function trend; given continue rising on Cr renal service has been consulted, will follow rec's  Essential hypertension -Holding ARB due to acute kidney injury -Continue PRN labetalol -will continue coreg 25 mg/BID -overall better, even still running high at times.  CAD/elevated troponin  -Currently stable, no complaints of CP this morning; but with chest tightness complaints yesterday evening -troponin most likely elevated in presence of demand ischemia and acute renal failure.  -Continue plavix and B-blocker  -recent outpatient stress test and low risk for ischemia -2-D echo with global hypokinesis (no focal wall abnormalities), EF 45-50%  Chronic anemia of chronic renal failure -no signs of acute bleeding -repeat CBC to follow Hgb trend -continue ferrous sulfate    Diarrhea -C. Diff neg -continue supportive  care -started on flroastor -will use PRN lomotil as well   Metabolic/Lactic acidosis -due to infection and acute on chronic renal failure -will follow Bicarb and lactic acid -given resp distress and edema seen on CXR IVF's stopped on 4/9. -will continue bicarb tablets   Suspected BPH -patient decrease urine stream and also with nicturia (prior to admission) -will continue flomax -had already follow up up arranged with urology service after discharge -renal US w/o obstructive abnormalities   Code Status: DNR Family Communication: no family at bedside  Disposition Plan: remains inpatient, continue IV antibiotics (now narrowed to Rocephin, base on cultures results); given worsening on renal function, renal service consulted. Will continue evaluation on telemetry bed and follow clinical response.  Consultants:  Renal service (Dr. Mercy Moore)   Procedures:  See below for x-ray reports   2-D echo - Left ventricle: The cavity size was normal. Wall thickness was   normal. Systolic function was mildly reduced. The estimated   ejection fraction was in the range of 45% to 50%. Diffuse   hypokinesis. Doppler parameters are consistent with abnormal left   ventricular relaxation (grade 1 diastolic dysfunction). Doppler   parameters are consistent with high ventricular filling pressure. - Aortic valve: Valve mobility was restricted. There was mild   stenosis. There was mild regurgitation. Valve area (VTI): 1.58   cm^2. Valve area (Vmax): 1.5 cm^2. Valve area (Vmean): 1.41 cm^2. - Mitral valve: Calcified annulus. There was mild to moderate   regurgitation. - Pulmonary arteries: Systolic pressure was moderately increased.   PA peak pressure: 59 mm Hg (S).  Impressions: - Mild global reduction in LV systolic function; grade 1 diastolic   dysfunction; elevated LV filling pressure; mild AS and AI; mild   to moderate MR; mild TR; moderately elevated pulmonary  pressure.  Antibiotics:  Vancomycin 03/28/17>>03/29/17  Zosyn 03/28/17>>>03/31/17  Rocephin 03/31/17  HPI/Subjective: No CP, Patient is feeling better, still with some intermittent episodes of diarrhea and general malaise. Using 2L [place] oxygen supplementation, but endorsing that breathing is much better. Lactic acid WNL now  Objective: Vitals:   03/31/17 2126 04/01/17 0616  BP: 126/61 (!) 143/47  Pulse: 66 68  Resp: 19 20  Temp: 98.5 F (36.9 C) 99.5 F (37.5 C)    Intake/Output Summary (Last 24 hours) at 04/01/17 1422 Last data filed at 04/01/17 0930  Gross per 24 hour  Intake              400 ml  Output              100 ml  Net              300 ml   Filed Weights   03/29/17 0110  Weight: 55.5 kg (122 lb 5.7 oz)    Exam:   General:  Afebrile, breathing easier and denying CP. Still requiring 2L [place] oxygen supplementation; but endorses no work of breathing. Patient denies abd pain; and dysuria currently. BP continue to be elevated; but much better overall. Able to urinate on his own and according to patient with increase in urinary output.   Cardiovascular: S1 and S2, no rubs or gallops  Respiratory: tachypneic, no wheezing, no crackles  Abdomen: soft, positive BS, no guarding   Musculoskeletal: no edema or cyanosis   Data Reviewed: Basic Metabolic Panel:  Recent Labs Lab 03/29/17 0332 03/29/17 1913 03/30/17 0629 03/31/17 0422 04/01/17 0623  NA 140 141 141 140 142  K 3.7 4.3 4.5 3.9 3.6  CL 118* 113* 111 111 113*  CO2 15* 19* 19* 17* 18*  GLUCOSE 124* 126* 129* 128* 112*  BUN 49* 60* 67* 85* 99*  CREATININE 3.02* 3.64* 4.10* 4.55* 5.03*  CALCIUM 7.7* 7.3* 7.5* 7.5* 7.9*   Liver Function Tests:  Recent Labs Lab 03/28/17 2140  AST 30  ALT 11*  ALKPHOS 71  BILITOT 0.8  PROT 6.6  ALBUMIN 3.3*   CBC:  Recent Labs Lab 03/28/17 2140 03/29/17 0332  WBC 8.1 15.1*  NEUTROABS 7.8* 13.9*  HGB 9.5* 9.3*  HCT 30.0* 29.5*  MCV 89.8 90.5  PLT 76* 53*    CBG: No results for input(s): GLUCAP in the last 168 hours.  Recent Results (from the past 240 hour(s))  Culture, blood (Routine x 2)     Status: Abnormal   Collection Time: 03/28/17  9:40 PM  Result Value Ref Range Status   Specimen Description BLOOD LAC  Final   Special Requests BOTTLES DRAWN AEROBIC AND ANAEROBIC BCAV  Final   Culture  Setup Time   Final    GRAM NEGATIVE RODS IN BOTH AEROBIC AND ANAEROBIC BOTTLES CRITICAL RESULT CALLED TO, READ BACK BY AND VERIFIED WITH: D. WOFFARD, PHARM AT [facility], 03/29/17 AT 1624 BY J FUDESCO Performed at [facility], [address] [place], [place] [address]    Culture KLEBSIELLA OXYTOCA (A)  Final   Report Status 03/31/2017 FINAL  Final   Organism ID, Bacteria KLEBSIELLA OXYTOCA  Final      Susceptibility   Klebsiella oxytoca - MIC*    AMPICILLIN >=32 RESISTANT Resistant     CEFAZOLIN 8 SENSITIVE Sensitive     CEFEPIME <=1 SENSITIVE Sensitive     CEFTAZIDIME <=1 SENSITIVE Sensitive     CEFTRIAXONE <=1 SENSITIVE Sensitive     CIPROFLOXACIN <=0.25  SENSITIVE Sensitive     GENTAMICIN <=1 SENSITIVE Sensitive     IMIPENEM <=0.25 SENSITIVE Sensitive     TRIMETH/SULFA <=20 SENSITIVE Sensitive     AMPICILLIN/SULBACTAM 16 INTERMEDIATE Intermediate     PIP/TAZO <=4 SENSITIVE Sensitive     Extended ESBL NEGATIVE Sensitive     * KLEBSIELLA OXYTOCA  Blood Culture ID Panel (Reflexed)     Status: Abnormal   Collection Time: 03/28/17  9:40 PM  Result Value Ref Range Status   Enterococcus species NOT DETECTED NOT DETECTED Final   Listeria monocytogenes NOT DETECTED NOT DETECTED Final   Staphylococcus species NOT DETECTED NOT DETECTED Final   Staphylococcus aureus NOT DETECTED NOT DETECTED Final   Streptococcus species NOT DETECTED NOT DETECTED Final   Streptococcus agalactiae NOT DETECTED NOT DETECTED Final   Streptococcus pneumoniae NOT DETECTED NOT DETECTED Final   Streptococcus pyogenes NOT DETECTED NOT DETECTED Final   Acinetobacter  baumannii NOT DETECTED NOT DETECTED Final   Enterobacteriaceae species DETECTED (A) NOT DETECTED Final    Comment: Enterobacteriaceae represent a large family of gram-negative bacteria, not a single organism. CRITICAL RESULT CALLED TO, READ BACK BY AND VERIFIED WITH: D. WOFFARD, PH[facility] AT [facility], 03/29/17 AT 1623 BY J FUDESCO    Enterobacter cloacae complex NOT DETECTED NOT DETECTED Final   Escherichia coli NOT DETECTED NOT DETECTED Final   Klebsiella oxytoca DETECTED (A) NOT DETECTED Final    Comment: CRITICAL RESULT CALLED TO, READ BACK BY AND VERIFIED WITH: D. WOFFARD, PHARM AT [facility], 03/29/17 AT 1623 BY J FUDESCO    Klebsiella pneumoniae NOT DETECTED NOT DETECTED Final   Proteus species NOT DETECTED NOT DETECTED Final   Serratia marcescens NOT DETECTED NOT DETECTED Final   Carbapenem resistance NOT DETECTED NOT DETECTED Final   Haemophilus influenzae NOT DETECTED NOT DETECTED Final   Neisseria meningitidis NOT DETECTED NOT DETECTED Final   Pseudomonas aeruginosa NOT DETECTED NOT DETECTED Final   Candida albicans NOT DETECTED NOT DETECTED Final   Candida glabrata NOT DETECTED NOT DETECTED Final   Candida krusei NOT DETECTED NOT DETECTED Final   Candida parapsilosis NOT DETECTED NOT DETECTED Final   Candida tropicalis NOT DETECTED NOT DETECTED Final    Comment: Performed at [facility], [address], [place], [place] [address]  Culture, blood (Routine x 2)     Status: Abnormal   Collection Time: 03/28/17  9:45 PM  Result Value Ref Range Status   Specimen Description BLOOD RIGHT ARM  Final   Special Requests IN PEDIATRIC BOTTLE BCAV  Final   Culture  Setup Time   Final    GRAM NEGATIVE RODS IN PEDIATRIC BOTTLE CRITICAL VALUE NOTED.  VALUE IS CONSISTENT WITH PREVIOUSLY REPORTED AND CALLED VALUE.    Culture (A)  Final    KLEBSIELLA OXYTOCA SUSCEPTIBILITIES PERFORMED ON PREVIOUS CULTURE WITHIN THE LAST 5 DAYS. Performed at [facility], [place] [address], [place], [place]  [address]    Report Status 03/31/2017 FINAL  Final  Urine culture     Status: Abnormal   Collection Time: 03/28/17 11:57 PM  Result Value Ref Range Status   Specimen Description URINE, CLEAN CATCH  Final   Special Requests NONE  Final   Culture >=100,000 COLONIES/mL KLEBSIELLA OXYTOCA (A)  Final   Report Status 03/31/2017 FINAL  Final   Organism ID, Bacteria KLEBSIELLA OXYTOCA (A)  Final      Susceptibility   Klebsiella oxytoca - MIC*    AMPICILLIN >=32 RESISTANT Resistant  CEFAZOLIN 8 SENSITIVE Sensitive     CEFTRIAXONE <=1 SENSITIVE Sensitive     CIPROFLOXACIN <=0.25 SENSITIVE Sensitive     GENTAMICIN <=1 SENSITIVE Sensitive     IMIPENEM <=0.25 SENSITIVE Sensitive     NITROFURANTOIN 32 SENSITIVE Sensitive     TRIMETH/SULFA <=20 SENSITIVE Sensitive     AMPICILLIN/SULBACTAM 16 INTERMEDIATE Intermediate     PIP/TAZO <=4 SENSITIVE Sensitive     Extended ESBL NEGATIVE Sensitive     * >=100,000 COLONIES/mL KLEBSIELLA OXYTOCA  MRSA PCR Screening     Status: None   Collection Time: 03/29/17  1:14 AM  Result Value Ref Range Status   MRSA by PCR NEGATIVE NEGATIVE Final    Comment:        The GeneXpert MRSA Assay (FDA approved for NASAL specimens only), is one component of a comprehensive MRSA colonization surveillance program. It is not intended to diagnose MRSA infection nor to guide or monitor treatment for MRSA infections.   C difficile quick scan w PCR reflex     Status: None   Collection Time: 03/29/17  3:08 PM  Result Value Ref Range Status   C Diff antigen NEGATIVE NEGATIVE Final   C Diff toxin NEGATIVE NEGATIVE Final   C Diff interpretation No C. difficile detected.  Final     Studies: No results found.  Scheduled Meds: . brinzolamide  1 drop Left Eye Q12H  . budesonide (PULMICORT) nebulizer solution  0.25 mg Nebulization BID  . carvedilol  25 mg Oral BID WC  . cefTRIAXone (ROCEPHIN)  IV  2 g Intravenous Q24H  . clopidogrel  75 mg Oral Daily  . ferrous sulfate   325 mg Oral Q breakfast  . latanoprost  1 drop Both Eyes QHS  . saccharomyces boulardii  250 mg Oral BID  . sodium bicarbonate  650 mg Oral BID  . tamsulosin  0.4 mg Oral QPC supper   Continuous Infusions:   Principal Problem:   Sepsis ([place]) Active Problems:   Essential hypertension   CAD S/P percutaneous coronary angioplasty - DES in RCA with PTCA for ISR; Moderate mLAD, 80% ostial OM2 stable   Left-sided carotid artery disease -- s/p CEA   Thrombocytopenia (HCC)   Acute renal failure superimposed on stage 4 chronic kidney disease (HCC)   Acute metabolic encephalopathy   Fever   Elevated lactic acid level   Time spent: 25 minutes   Barton Dubois  [facility] Pager [phone]. If 7PM-7AM, please contact night-coverage at [email or web address], password [facility] 04/01/2017, 2:22 PM  LOS: 4 days

## 2017-04-01 NOTE — Consult Note (Signed)
Nicholas Porter is an 83 y.o. male referred by Dr Dyann Kief   Chief Complaint: Acute on CKD 3, metabolic acidosis HPI: 08QP M admitted on 03/28/17 for weakness, confusion and fevers.  Urine and blood cultures growing Klebsiella.  Had similar UTI just last month.  Has CKD 3 with baseline Scr in the upper 1's and reportedly is due to see someone in our office next month.  Baseline Scr seems to be 1.6-1.9 ( As far back as 2015) and on admission Scr was 2.99 and has increased to 5.03. UO only 200cc yest but otherwise not well recorded and does not have an indwelling foley.  Was on an ARB on admission but has not had since. Renal US shows 2 kidneys Rt 9cm Lt 8.5cm with increased echogenicity and mild prominence of lt collecting system. Past Medical History:  Diagnosis Date  . Anemia of chronic renal failure, stage 4 (severe) ([place]) 11/27/2015  . 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 CAD S/P percutaneous coronary angioplasty - DES in RCA with PTCA for ISR; Moderate mLAD, 80% ostial OM2 stable 05/22/2008   Qualifier: Diagnosis of  By: Linda Hedges MD, Heinz Knuckles   . CKD (chronic kidney disease) stage 3, GFR 30-59 ml/min   . Essential hypertension 06/30/2007   Qualifier: Diagnosis of  By: Cori Razor RN, Mikal Plane    . H/O Inferior STEMI (09/2013) emergent PCI with Promus DES to RCA (3.0 mm  x 28 mm - 3.2 mm) 10/05/2013  . Left-sided carotid artery disease -- s/p CEA 04/06/2006   S/p Left CEA   . Mitral regurgitation - onexam 09/03/2016    Past Surgical History:  Procedure Laterality Date  . AMPUTATION     left index finger -tramatic  . CARDIAC CATHETERIZATION  November 2011   40% mid LAD, 50% proximal RCA, 30-40% distal RCA (DOMINANT RCA with wraparound PDA & 2 large PLBs), 60-70% ostial OM1. No change from 2001. Medical therapy  . CAROTID ENDARTERECTOMY Left 2007   Dr.Hayes: 08/2016 Dopplers: Stable less than 40% right internal carotid  stenosis and stable moderate 40-60% left internal carotid stenosis. Patent vertebral and subclavian arteries.  . CARPAL TUNNEL RELEASE    . CORNEAL TRANSPLANT    . CORONARY ANGIOPLASTY WITH STENT PLACEMENT  09/2013; 05/2014    d) Promus DES 3.0 mm x 28 mm (3.2 mm)  to RCA with STEMI; residual 70% mid-distal LAD, OM 270-80%. Medical management; b) PTCA of 75 % ISR , LAD lesion noted as ~40%  . FOOT SURGERY     left  . HERNIA REPAIR    . LEFT HEART CATHETERIZATION WITH CORONARY ANGIOGRAM N/A 10/05/2013   Procedure: LEFT HEART CATHETERIZATION WITH CORONARY ANGIOGRAM;  Surgeon: Peter M Martinique, MD;  Location: [facility] CATH LAB;  Service: Cardiovascular;  RCA 100% - PCI,CxOM stable, LAD ~70% mid;  . LEFT HEART CATHETERIZATION WITH CORONARY ANGIOGRAM N/A 06/08/2014   Procedure: LEFT HEART CATHETERIZATION WITH CORONARY ANGIOGRAM;  Surgeon: Troy Sine, MD;  Location: [facility] CATH LAB;  Service: Cardiovascular;  UA 6/'15: 75% ISR in RCA - PTCA, LAD lesion ~40%, CxOM stable.    Marland Kitchen [facility]  11/2013; 08/1014   a) 12/'14: EF 59%, Fixed Inferior-Inferoapical Defect c/w Inferior Scar; No Ischemia; b) 9/'15:  9/'15: EF 55% - persistent fixed inferior-inferoapical perfusion defect  - read as diaphragmatic attenuation, likely consistent with infarct/scar   . PARTIAL GASTRECTOMY    . PERCUTANEOUS  CORONARY INTERVENTION-BALLOON ONLY  06/08/2014   Procedure: PERCUTANEOUS CORONARY INTERVENTION-BALLOON ONLY;  Surgeon: Troy Sine, MD;  Location: [facility] CATH LAB;  Service: Cardiovascular;;  . ROTATOR CUFF REPAIR     left  . SHOULDER ARTHROSCOPY WITH ROTATOR CUFF REPAIR AND SUBACROMIAL DECOMPRESSION Right 07/01/2013   Procedure: RIGHT SHOULDER ARTHROSCOPY WITH  SUBACROMIAL DECOMPRESSION/DISTAL CLAVICLE RESECTION/ROTATOR CUFF REPAIR ;  Surgeon: Marin Shutter, MD;  Location: [place];  Service: Orthopedics;  Laterality: Right;  . SPINE SURGERY  1985   cervical laminectomy  . TOTAL KNEE ARTHROPLASTY    . TRANSTHORACIC  ECHOCARDIOGRAM  08/2016   Normal EF 55-60%. Mild to moderate aortic stenosis with moderate regurgitation. Mild MR.  . VAGOTOMY     partial gastrectomy    Family History  Problem Relation Age of Onset  . Heart disease Sister   . Diabetes Sister   FH neg for renal Ds  Social History:  reports that he quit smoking about 52 years ago. His smoking use included Cigarettes. He has never used smokeless tobacco. He reports that he does not drink alcohol or use drugs. Lives with his wife in [place]  Allergies:  Allergies  Allergen Reactions  . Amlodipine Besylate Hives  . Hydralazine Itching  . Lipitor [Atorvastatin] Itching  . Naproxen Diarrhea  . Oxycodone-Acetaminophen Itching  . Isosorbide Rash    Medications Prior to Admission  Medication Sig Dispense Refill  . brinzolamide (AZOPT) 1 % ophthalmic suspension Place 1 drop into the left eye every 12 (twelve) hours.     . clopidogrel (PLAVIX) 75 MG tablet TAKE 1 TABLET BY MOUTH DAILY (Patient taking differently: TAKE  75 MG BY MOUTH DAILY) 90 tablet 2  . ferrous sulfate 325 (65 FE) MG EC tablet Take 325 mg by mouth daily with breakfast.    . losartan (COZAAR) 50 MG tablet Take 1 tablet (50 mg total) by mouth daily. HOLD UNTIL FOLLOW UP WITH PRIMARY CARE DOCTOR    . metoprolol succinate (TOPROL-XL) 25 MG 24 hr tablet Take 1.5 tablets (37.5 mg total) by mouth daily. 45 tablet 5  . nitroGLYCERIN (NITROSTAT) 0.4 MG SL tablet Place 1 tablet (0.4 mg total) under the tongue every 5 (five) minutes as needed. For chest pain. 25 tablet 6  . saccharomyces boulardii (FLORASTOR) 250 MG capsule Take 1 capsule (250 mg total) by mouth 2 (two) times daily. 20 capsule 0  . TRAVATAN Z 0.004 % SOLN ophthalmic solution Place 1 drop into both eyes at bedtime.        Lab Results: UA: 30mg  protein, TNTC WBC's, 0-5 rbc's No results for input(s): WBC, HGB, HCT, PLT in the last 72 hours. BMET  Recent Labs  03/30/17 0629 03/31/17 0422 04/01/17 0623  NA 141  140 142  K 4.5 3.9 3.6  CL 111 111 113*  CO2 19* 17* 18*  GLUCOSE 129* 128* 112*  BUN 67* 85* 99*  CREATININE 4.10* 4.55* 5.03*  CALCIUM 7.5* 7.5* 7.9*   LFT No results for input(s): PROT, ALBUMIN, AST, ALT, ALKPHOS, BILITOT, BILIDIR, IBILI in the last 72 hours. No results found.  ROS: Appetite decreased Mild SOB No CP No Abd pain + diarrhea since starting AB No neuropathic Sxs  PHYSICAL EXAM: Blood pressure (!) 143/47, pulse 68, temperature 99.5 F (37.5 C), temperature source Oral, resp. rate 20, height 5\' 6"  (1.676 m), weight 55.5 kg (122 lb 5.7 oz), SpO2 100 %. HEENT: PERRLA EOMI NECK:No JVD  Bil carotid bruits Rt>>Lt LUNGS:clear CARDIAC:RRR wo MRG ABD:+  BS NT Mild suprapubic fullness with mild tenderness EXT:No edema.  Traumatic amp of Lt index finger NEURO:CNI, Ox3 No asterixis  Assessment: 1. Acute on CKD 3 in setting of Klebsiella Oxytoca bacteremia.  Suspect hemodynamics changes from infection as I do not get any sxs of back pain to suggest pyelo 2. Klebsiella UTI and bacteremia 3. HTN 4. Metabolic acidosis sec #1 5. Chronic thrombocytopenia  PLAN: 1. Discussed the chances that renal fx could worsen and require HD.  If so expect it to be temporary.  If needed HD then he would need to be transferred to [facility] 2. Check SPEP 3. Place foley to make sure bladder is decompressed 4. Cont po bicarb 5. Start PO lasix to increase UO 6. Will get FU renal US in few days as unclear of the significance of the "mild prominence of Lt collecting system" 7. Stay off ARB 8. Daily Scr 9. Renal diet   Shakeem Stern T 04/01/2017, 2:21 PM

## 2017-04-01 NOTE — Care Management Important Message (Signed)
Important Message  Patient Details  Name: Lucion Dilger MRN: [id number] Date of Birth: 08-06-1930   Medicare Important Message Given:  Yes    Kerin Salen 04/01/2017, 12:00 [place] Message  Patient Details  Name: Gillian Kluever MRN: [id number] Date of Birth: September 25, 1930   Medicare Important Message Given:  Yes    Kerin Salen 04/01/2017, 12:00 PM

## 2017-04-02 DIAGNOSIS — A4159 Other Gram-negative sepsis: Secondary | ICD-10-CM

## 2017-04-02 DIAGNOSIS — N179 Acute kidney failure, unspecified: Secondary | ICD-10-CM | POA: Diagnosis present

## 2017-04-02 DIAGNOSIS — B9689 Other specified bacterial agents as the cause of diseases classified elsewhere: Secondary | ICD-10-CM | POA: Diagnosis present

## 2017-04-02 DIAGNOSIS — B961 Klebsiella pneumoniae [K. pneumoniae] as the cause of diseases classified elsewhere: Secondary | ICD-10-CM

## 2017-04-02 DIAGNOSIS — N390 Urinary tract infection, site not specified: Secondary | ICD-10-CM | POA: Diagnosis present

## 2017-04-02 DIAGNOSIS — N183 Chronic kidney disease, stage 3 (moderate): Secondary | ICD-10-CM

## 2017-04-02 DIAGNOSIS — A414 Sepsis due to anaerobes: Secondary | ICD-10-CM

## 2017-04-02 HISTORY — DX: Other gram-negative sepsis: A41.59

## 2017-04-02 LAB — PROTEIN ELECTROPHORESIS, SERUM
A/G RATIO SPE: 1 (ref 0.7–1.7)
ALBUMIN ELP: 2.4 g/dL — AB (ref 2.9–4.4)
ALPHA-1-GLOBULIN: 0.4 g/dL (ref 0.0–0.4)
ALPHA-2-GLOBULIN: 0.9 g/dL (ref 0.4–1.0)
Beta Globulin: 0.7 g/dL (ref 0.7–1.3)
GLOBULIN, TOTAL: 2.5 g/dL (ref 2.2–3.9)
Gamma Globulin: 0.5 g/dL (ref 0.4–1.8)
Total Protein ELP: 4.9 g/dL — ABNORMAL LOW (ref 6.0–8.5)

## 2017-04-02 LAB — CBC WITH DIFFERENTIAL/PLATELET
BASOS ABS: 0 10*3/uL (ref 0.0–0.1)
Basophils Relative: 0 %
Eosinophils Absolute: 0.1 10*3/uL (ref 0.0–0.7)
Eosinophils Relative: 1 %
HCT: 24.2 % — ABNORMAL LOW (ref 39.0–52.0)
HEMOGLOBIN: 7.8 g/dL — AB (ref 13.0–17.0)
LYMPHS ABS: 0.5 10*3/uL — AB (ref 0.7–4.0)
LYMPHS PCT: 9 %
MCH: 27.9 pg (ref 26.0–34.0)
MCHC: 32.2 g/dL (ref 30.0–36.0)
MCV: 86.4 fL (ref 78.0–100.0)
MONOS PCT: 9 %
Monocytes Absolute: 0.5 10*3/uL (ref 0.1–1.0)
NEUTROS PCT: 81 %
Neutro Abs: 4.7 10*3/uL (ref 1.7–7.7)
Platelets: 44 10*3/uL — ABNORMAL LOW (ref 150–400)
RBC: 2.8 MIL/uL — AB (ref 4.22–5.81)
RDW: 21.4 % — ABNORMAL HIGH (ref 11.5–15.5)
WBC: 5.8 10*3/uL (ref 4.0–10.5)

## 2017-04-02 LAB — RENAL FUNCTION PANEL
ALBUMIN: 2.4 g/dL — AB (ref 3.5–5.0)
ANION GAP: 12 (ref 5–15)
BUN: 96 mg/dL — AB (ref 6–20)
CO2: 20 mmol/L — ABNORMAL LOW (ref 22–32)
Calcium: 8.4 mg/dL — ABNORMAL LOW (ref 8.9–10.3)
Chloride: 111 mmol/L (ref 101–111)
Creatinine, Ser: 5.58 mg/dL — ABNORMAL HIGH (ref 0.61–1.24)
GFR, EST AFRICAN AMERICAN: 10 mL/min — AB (ref >60)
GFR, EST NON AFRICAN AMERICAN: 8 mL/min — AB (ref >60)
Glucose, Bld: 100 mg/dL — ABNORMAL HIGH (ref 65–99)
PHOSPHORUS: 5.8 mg/dL — AB (ref 2.5–4.6)
POTASSIUM: 3.7 mmol/L (ref 3.5–5.1)
Sodium: 143 mmol/L (ref 135–145)

## 2017-04-02 MED ORDER — LIP MEDEX EX OINT
TOPICAL_OINTMENT | CUTANEOUS | Status: DC | PRN
  Filled 2017-04-02: qty 7

## 2017-04-02 MED ORDER — WHITE PETROLATUM GEL
Status: AC
  Filled 2017-04-02: qty 1

## 2017-04-02 MED ORDER — FERUMOXYTOL INJECTION 510 MG/17 ML
510.0000 mg | INTRAVENOUS | Status: DC
  Administered 2017-04-02: 510 mg via INTRAVENOUS
  Filled 2017-04-02: qty 17

## 2017-04-02 NOTE — Progress Notes (Signed)
Initial Nutrition Assessment  DOCUMENTATION CODES:   Severe malnutrition in context of chronic illness  INTERVENTION:   Nepro Shake po TID, each supplement provides 425 kcal and 19 grams protein  Magic cup TID with meals, each supplement provides 290 kcal and 9 grams of protein  NUTRITION DIAGNOSIS:   Malnutrition (severe) related to chronic illness, CKD, UTI w/ sepsis as evidenced by severe depletion of muscle mass, severe depletion of body fat.  GOAL:   Patient will meet greater than or equal to 90% of their needs  MONITOR:   PO intake, Supplement acceptance, Labs, Weight trends  REASON FOR ASSESSMENT:   Other (Comment) (low BMI)    ASSESSMENT:   83 y.o. male with medical history significant of CKD III-IV, HTN, CAD comes in with fever, confusion and near syncope event. Found to have sepsis due to UTI most likely.   Met with pt in room today. Pt reports poor appetite and oral intake for 1 week pta. Pt has history of gastrectomy secondary to ulcers and gets full easily. Pt currently eating 50% meals. Pt ate eggs and a popcicle for breakfast this morning. Per chart, pt is weight stable. Pt complaining of diarrhea today. Pt with worsening renal values and high phosphorus. Plan is for pt to [facility] for [facility] hemodialysis. Pt wants strawberry supplement; can give pt Ensure if refusing Nepro.   Medications reviewed and include: ceftriaxone, plavix, lasix, florastor, Na Bicarb  Labs reviewed: BUN 96(H), creat 5.58(H), Ca 8.4(L) adj. 9.68 wnl, P 5.8(H), Alb 2.4(L) Hgb 7.8(L), Hct 24.2(L)  Nutrition-Focused physical exam completed. Findings are severe fat depletion, severe muscle depletion, and no edema.   Diet Order:  Diet renal with fluid restriction Fluid restriction: 1200 mL Fluid; Room service appropriate? Yes; Fluid consistency: Thin  Skin:  Reviewed, no issues  Last BM:  4/11  Height:   Ht Readings from Last 1 Encounters:  03/29/17 '5\' 6"'  (1.676 m)     Weight:   Wt Readings from Last 1 Encounters:  03/29/17 122 lb 5.7 oz (55.5 kg)    Ideal Body Weight:  64.5 kg  BMI:  Body mass index is 19.75 kg/m.  Estimated Nutritional Needs:   Kcal:  1650-1950kcal/day   Protein:  72-83g/day   Fluid:  >1.6L/day   EDUCATION NEEDS:   No education needs identified at this time  Koleen Distance, RD, LDN Pager #[phone]

## 2017-04-02 NOTE — Progress Notes (Signed)
Received pt as a transfer from [facility] via [place]. Pt is Alert and Oriented and placed on 2LNC. He has a PIV and is SL. Pt also has a Foley Cath that was placed at [facility] for decompression.

## 2017-04-02 NOTE — Care Management Note (Signed)
Case Management Note  Patient Details  Name: Rane Blitch MRN: [id number] Date of Birth: 01/22/1930  Subjective/Objective:       Renal failure/ bun-90, creat-5.58/may require dialysis at [facility].             Action/Plan: Date:  April 02, 2017 Chart reviewed for concurrent status and case management needs. Will continue to follow patient progress. Discharge Planning: following for needs Expected discharge date: [id number] Velva Harman, BSN, [place], [place]  Expected Discharge Date:                  Expected Discharge Plan:  Home/Self Care  In-House Referral:     Discharge planning Services     Post Acute Care Choice:    Choice offered to:     DME Arranged:    DME Agency:     HH Arranged:    [place] Agency:     Status of Service:  In process, will continue to follow  If discussed at Long Length of Stay Meetings, dates discussed:    Additional Comments:  Leeroy Cha, RN 04/02/2017, 9:54 AM

## 2017-04-02 NOTE — Progress Notes (Addendum)
PROGRESS NOTE  Jacek Colson WER:[id number] DOB: 07/11/1930 DOA: 03/28/2017 PCP: Cathlean Cower, MD  Brief History:  83 y.o.malewith medical history significant of CKD, HTN, CAD comes in with fever, confusion and near syncope event. Found to have sepsis due to UTI most likely. Still with elevated lactic acid and experienced acute resp distress. BP difficult to control on current therapy.  Assessment/Plan: Sepsis  -due to klebsiella Oxytoca Urinary tract infection and bacteremia -continue ceftriaxone 2 grams daily--D#1 of 14 was 03/31/17 -sepsis physiology resolving/improved. -will continue supportive care  Klebsiella bacteremia and UTI -continue ceftriaxone 2 grams daily--D#1 of 14 was 03/31/17  Acute kidney injury on chronic kidney disease stage III -Baseline creatinine of 1.4-1.6 -Renal ultrasound-- Mild prominence of the central left renal collecting system not seen previously. No gross hydronephrosis or caliectasis. -continue holding nephrotoxic agents -appreciate nephrology follow up -transfer to [facility] for likely initiation of HD--discussed with Dr. Mercy Moore  Volume overload  -secondary to acute on chronic renal failure -03/29/17 CXR--pulmonary vascular congestion -continue lasix -stable on 2 L-->100%  Essential hypertension -HoldingARB due to acute kidney injury -Continue PRN labetalol -continue coreg 25 mg/BID -overall better  CAD/elevated troponin  -Currently stable, no complaints of CP -troponin most likely elevated in presence of demand ischemia and acute renal failure.  -Continue plavix and B-blocker  -recent outpatient stress test and low risk for ischemia -03/30/17-- echo with global hypokinesis (no focal wall abnormalities), EF 45-50%  Chronic anemia of chronic renal failure -no signs of acute bleeding -baseline Hgb~8-9 - follow Hgb trend -IV iron per renal  Diarrhea -C. Diff neg -continue supportive care -started on flroastor -will use PRN  lomotil as well   Metabolic/Lactic acidosis -due to infection and acute on chronic renal failure -continue po bicarb -given resp distress and edema seen on CXR IVF's stopped on 4/9.  Suspected BPH -patient decrease urine stream and also with nicturia (prior to admission) -will continue flomax -had already follow up up arranged with urology service after discharge -renal US--Mild prominence of the central left renal collecting system not seen previously. No gross hydronephrosis or caliectasis.  Thrombocytopenia -due to sepsis -trend   Disposition Plan:  Not stable for d/c; transfer to [facility] Communication:   Family at bedside  Consultants:  nephrology  Code Status:  DNR  DVT Prophylaxis:  SCDs   Procedures: As Listed in Progress Note Above  Antibiotics: None    Subjective: Patient denies fevers, chills, headache, chest pain, dyspnea, nausea, vomiting, diarrhea, abdominal pain, dysuria, hematuria, hematochezia, and melena.   Objective: Vitals:   04/01/17 2124 04/02/17 0612 04/02/17 0757 04/02/17 1004  BP: (!) 143/54 (!) 152/48 (!) 154/50   Pulse: 62 64 64   Resp: 20 (!) 22    Temp: 98.2 F (36.8 C) 98.6 F (37 C)    TempSrc: Oral Oral    SpO2: 100% 100%  100%  Weight:      Height:        Intake/Output Summary (Last 24 hours) at 04/02/17 1301 Last data filed at 04/02/17 1022  Gross per 24 hour  Intake              770 ml  Output             2125 ml  Net            -1355 ml   Weight change:  Exam:   General:  Pt is alert, follows commands appropriately,  not in acute distress  HEENT: No icterus, No thrush, No neck mass, [place]/AT  Cardiovascular: RRR, S1/S2, no rubs, no gallops  Respiratory: bibasilar crackles, no wheezing, no crackles, no rhonchi  Abdomen: Soft/+BS, non tender, non distended, no guarding  Extremities: No edema, No lymphangitis, No petechiae, No rashes, no synovitis   Data Reviewed: I have personally reviewed  following labs and imaging studies Basic Metabolic Panel:  Recent Labs Lab 03/29/17 1913 03/30/17 0629 03/31/17 0422 04/01/17 0623 04/02/17 0630  NA 141 141 140 142 143  K 4.3 4.5 3.9 3.6 3.7  CL 113* 111 111 113* 111  CO2 19* 19* 17* 18* 20*  GLUCOSE 126* 129* 128* 112* 100*  BUN 60* 67* 85* 99* 96*  CREATININE 3.64* 4.10* 4.55* 5.03* 5.58*  CALCIUM 7.3* 7.5* 7.5* 7.9* 8.4*  PHOS  --   --   --   --  5.8*   Liver Function Tests:  Recent Labs Lab 03/28/17 2140 04/02/17 0630  AST 30  --   ALT 11*  --   ALKPHOS 71  --   BILITOT 0.8  --   PROT 6.6  --   ALBUMIN 3.3* 2.4*   No results for input(s): LIPASE, AMYLASE in the last 168 hours. No results for input(s): AMMONIA in the last 168 hours. Coagulation Profile:  Recent Labs Lab 03/28/17 2140  INR 1.26   CBC:  Recent Labs Lab 03/28/17 2140 03/29/17 0332 04/02/17 0630  WBC 8.1 15.1* 5.8  NEUTROABS 7.8* 13.9* 4.7  HGB 9.5* 9.3* 7.8*  HCT 30.0* 29.5* 24.2*  MCV 89.8 90.5 86.4  PLT 76* 53* 44*   Cardiac Enzymes:  Recent Labs Lab 03/29/17 1913 03/30/17 0012 03/30/17 0629  TROPONINI 0.82* 0.93* 0.84*   BNP: Invalid input(s): POCBNP CBG: No results for input(s): GLUCAP in the last 168 hours. HbA1C: No results for input(s): HGBA1C in the last 72 hours. Urine analysis:    Component Value Date/Time   COLORURINE YELLOW 04/01/2017 1702   APPEARANCEUR HAZY (A) 04/01/2017 1702   LABSPEC 1.013 04/01/2017 1702   PHURINE 5.0 04/01/2017 1702   GLUCOSEU NEGATIVE 04/01/2017 1702   GLUCOSEU NEGATIVE 03/11/2017 1605   HGBUR SMALL (A) 04/01/2017 1702   HGBUR negative 12/06/2009 0836   BILIRUBINUR NEGATIVE 04/01/2017 1702   BILIRUBINUR negative 02/13/2017 1025   KETONESUR NEGATIVE 04/01/2017 1702   PROTEINUR NEGATIVE 04/01/2017 1702   UROBILINOGEN 0.2 03/11/2017 1605   NITRITE NEGATIVE 04/01/2017 1702   LEUKOCYTESUR NEGATIVE 04/01/2017 1702   Sepsis  Labs: @LABRCNTIP (procalcitonin:4,lacticidven:4) ) Recent Results (from the past 240 hour(s))  Culture, blood (Routine x 2)     Status: Abnormal   Collection Time: 03/28/17  9:40 PM  Result Value Ref Range Status   Specimen Description BLOOD LAC  Final   Special Requests BOTTLES DRAWN AEROBIC AND ANAEROBIC BCAV  Final   Culture  Setup Time   Final    GRAM NEGATIVE RODS IN BOTH AEROBIC AND ANAEROBIC BOTTLES CRITICAL RESULT CALLED TO, READ BACK BY AND VERIFIED WITH: D. WOFFARD, PHARM AT [facility], 03/29/17 AT 1624 BY J FUDESCO Performed at [facility], [place] [address], [place], [place] [address]    Culture KLEBSIELLA OXYTOCA (A)  Final   Report Status 03/31/2017 FINAL  Final   Organism ID, Bacteria KLEBSIELLA OXYTOCA  Final      Susceptibility   Klebsiella oxytoca - MIC*    AMPICILLIN >=32 RESISTANT Resistant     CEFAZOLIN 8 SENSITIVE Sensitive     CEFEPIME <=1  SENSITIVE Sensitive     CEFTAZIDIME <=1 SENSITIVE Sensitive     CEFTRIAXONE <=1 SENSITIVE Sensitive     CIPROFLOXACIN <=0.25 SENSITIVE Sensitive     GENTAMICIN <=1 SENSITIVE Sensitive     IMIPENEM <=0.25 SENSITIVE Sensitive     TRIMETH/SULFA <=20 SENSITIVE Sensitive     AMPICILLIN/SULBACTAM 16 INTERMEDIATE Intermediate     PIP/TAZO <=4 SENSITIVE Sensitive     Extended ESBL NEGATIVE Sensitive     * KLEBSIELLA OXYTOCA  Blood Culture ID Panel (Reflexed)     Status: Abnormal   Collection Time: 03/28/17  9:40 PM  Result Value Ref Range Status   Enterococcus species NOT DETECTED NOT DETECTED Final   Listeria monocytogenes NOT DETECTED NOT DETECTED Final   Staphylococcus species NOT DETECTED NOT DETECTED Final   Staphylococcus aureus NOT DETECTED NOT DETECTED Final   Streptococcus species NOT DETECTED NOT DETECTED Final   Streptococcus agalactiae NOT DETECTED NOT DETECTED Final   Streptococcus pneumoniae NOT DETECTED NOT DETECTED Final   Streptococcus pyogenes NOT DETECTED NOT DETECTED Final   Acinetobacter baumannii NOT  DETECTED NOT DETECTED Final   Enterobacteriaceae species DETECTED (A) NOT DETECTED Final    Comment: Enterobacteriaceae represent a large family of gram-negative bacteria, not a single organism. CRITICAL RESULT CALLED TO, READ BACK BY AND VERIFIED WITH: D. WOFFARD, PH[facility] AT [facility], 03/29/17 AT 1623 BY J FUDESCO    Enterobacter cloacae complex NOT DETECTED NOT DETECTED Final   Escherichia coli NOT DETECTED NOT DETECTED Final   Klebsiella oxytoca DETECTED (A) NOT DETECTED Final    Comment: CRITICAL RESULT CALLED TO, READ BACK BY AND VERIFIED WITH: D. WOFFARD, PHARM AT [facility], 03/29/17 AT 1623 BY J FUDESCO    Klebsiella pneumoniae NOT DETECTED NOT DETECTED Final   Proteus species NOT DETECTED NOT DETECTED Final   Serratia marcescens NOT DETECTED NOT DETECTED Final   Carbapenem resistance NOT DETECTED NOT DETECTED Final   Haemophilus influenzae NOT DETECTED NOT DETECTED Final   Neisseria meningitidis NOT DETECTED NOT DETECTED Final   Pseudomonas aeruginosa NOT DETECTED NOT DETECTED Final   Candida albicans NOT DETECTED NOT DETECTED Final   Candida glabrata NOT DETECTED NOT DETECTED Final   Candida krusei NOT DETECTED NOT DETECTED Final   Candida parapsilosis NOT DETECTED NOT DETECTED Final   Candida tropicalis NOT DETECTED NOT DETECTED Final    Comment: Performed at [facility], [address] [place], [place] [address]  Culture, blood (Routine x 2)     Status: Abnormal   Collection Time: 03/28/17  9:45 PM  Result Value Ref Range Status   Specimen Description BLOOD RIGHT ARM  Final   Special Requests IN PEDIATRIC BOTTLE BCAV  Final   Culture  Setup Time   Final    GRAM NEGATIVE RODS IN PEDIATRIC BOTTLE CRITICAL VALUE NOTED.  VALUE IS CONSISTENT WITH PREVIOUSLY REPORTED AND CALLED VALUE.    Culture (A)  Final    KLEBSIELLA OXYTOCA SUSCEPTIBILITIES PERFORMED ON PREVIOUS CULTURE WITHIN THE LAST 5 DAYS. Performed at [facility], [place] [address], [place], [place] [address]    Report  Status 03/31/2017 FINAL  Final  Urine culture     Status: Abnormal   Collection Time: 03/28/17 11:57 PM  Result Value Ref Range Status   Specimen Description URINE, CLEAN CATCH  Final   Special Requests NONE  Final   Culture >=100,000 COLONIES/mL KLEBSIELLA OXYTOCA (A)  Final   Report Status 03/31/2017 FINAL  Final   Organism ID, Bacteria KLEBSIELLA OXYTOCA (A)  Final      Susceptibility   Klebsiella oxytoca - MIC*    AMPICILLIN >=32 RESISTANT Resistant     CEFAZOLIN 8 SENSITIVE Sensitive     CEFTRIAXONE <=1 SENSITIVE Sensitive     CIPROFLOXACIN <=0.25 SENSITIVE Sensitive     GENTAMICIN <=1 SENSITIVE Sensitive     IMIPENEM <=0.25 SENSITIVE Sensitive     NITROFURANTOIN 32 SENSITIVE Sensitive     TRIMETH/SULFA <=20 SENSITIVE Sensitive     AMPICILLIN/SULBACTAM 16 INTERMEDIATE Intermediate     PIP/TAZO <=4 SENSITIVE Sensitive     Extended ESBL NEGATIVE Sensitive     * >=100,000 COLONIES/mL KLEBSIELLA OXYTOCA  MRSA PCR Screening     Status: None   Collection Time: 03/29/17  1:14 AM  Result Value Ref Range Status   MRSA by PCR NEGATIVE NEGATIVE Final    Comment:        The GeneXpert MRSA Assay (FDA approved for NASAL specimens only), is one component of a comprehensive MRSA colonization surveillance program. It is not intended to diagnose MRSA infection nor to guide or monitor treatment for MRSA infections.   C difficile quick scan w PCR reflex     Status: None   Collection Time: 03/29/17  3:08 PM  Result Value Ref Range Status   C Diff antigen NEGATIVE NEGATIVE Final   C Diff toxin NEGATIVE NEGATIVE Final   C Diff interpretation No C. difficile detected.  Final     Scheduled Meds: . brinzolamide  1 drop Left Eye Q12H  . budesonide (PULMICORT) nebulizer solution  0.25 mg Nebulization BID  . carvedilol  25 mg Oral BID WC  . cefTRIAXone (ROCEPHIN)  IV  2 g Intravenous Q24H  . clopidogrel  75 mg Oral Daily  . ferumoxytol  510 mg Intravenous Weekly  . furosemide  160 mg  Oral BID  . latanoprost  1 drop Both Eyes QHS  . saccharomyces boulardii  250 mg Oral BID  . sodium bicarbonate  650 mg Oral BID  . tamsulosin  0.4 mg Oral QPC supper   Continuous Infusions:  Procedures/Studies: US Renal  Result Date: 03/29/2017 CLINICAL DATA:  Acute renal injury EXAM: RENAL / URINARY TRACT ULTRASOUND COMPLETE COMPARISON:  February 24, 2017 FINDINGS: Right Kidney: Length: 9 cm. Increased cortical echogenicity. No mass or hydronephrosis. Left Kidney: Length: 8.5 cm. Mild prominence of the central left renal collecting system as seen on image 26 without gross hydronephrosis. This was not visualized previously. Bladder: The bladder is decompressed and poorly evaluated but grossly unremarkable. IMPRESSION: Mild prominence of the central left renal collecting system not seen previously. No gross hydronephrosis or caliectasis. Recommend clinical correlation. Electronically Signed   By: Dorise Bullion III M.D   On: 03/29/2017 09:43   Dg Chest Port 1 View  Result Date: 03/29/2017 CLINICAL DATA:  Shortness of breath. History carneae artery disease, inferior STEMI and mitral regurgitation. History of cardiac catheterization. Former smoker. EXAM: PORTABLE CHEST 1 VIEW COMPARISON:  Chest x-rays dated 03/28/2017, 02/23/2017 and 07/29/2015 FINDINGS: Heart size is upper normal. Atherosclerotic changes noted at the aortic arch. There is new central pulmonary vascular congestion and hazy opacities at each lung base which likely represents associated interstitial edema. No significant pleural effusion. No pneumothorax seen. IMPRESSION: 1. Central pulmonary vascular congestion, and probable bibasilar interstitial edema, suggesting mild volume overload/CHF. 2.  Aortic atherosclerosis. Electronically Signed   By: Franki Cabot M.D.   On: 03/29/2017 18:16   Dg Chest Port 1 View  Result Date: 03/28/2017 CLINICAL DATA:  Weakness and confusion tonight.  Near syncope. EXAM: PORTABLE CHEST 1 VIEW COMPARISON:   02/23/2017 FINDINGS: AP portable views of the chest demonstrate no focal airspace consolidation or alveolar edema. The lungs are grossly clear. There is no large effusion or pneumothorax. Cardiac and mediastinal contours appear unremarkable. IMPRESSION: No active disease. Electronically Signed   By: Andreas Newport M.D.   On: 03/28/2017 22:09    Izza Bickle, DO  [facility] Pager [phone]  If 7PM-7AM, please contact night-coverage [email or web address] Password [email or web address] 04/02/2017, 1:01 PM   LOS: 5 days

## 2017-04-02 NOTE — Progress Notes (Signed)
Physical Therapy Treatment Patient Details Name: Nicholas Porter MRN: [id number] DOB: 1930/07/18 Today's Date: 04/02/2017    History of Present Illness Pt admitted with confusion and near syncope and dx with sepsis 2* UTI.  Pt with hx of DKC, HTN, CAD and MI    PT Comments    Bed level exercises only this session. Pt c/o fatigue and he did not feel he could tolerate much activity on today. Per chart, and pt, he may transfer to [facility]. Will continue to follow and assess/update d/c needs next session.     Follow Up Recommendations  Home health PT;Supervision/Assistance - 24 hour (depending on progress)     Equipment Recommendations  None recommended by PT    Recommendations for Other Services       Precautions / Restrictions Precautions Precautions: Fall Restrictions Weight Bearing Restrictions: No    Mobility  Bed Mobility               General bed mobility comments: NT-pt declined OOB-too fatigued  Transfers                    Ambulation/Gait                 Stairs            Wheelchair Mobility    Modified Rankin (Stroke Patients Only)       Balance                                            Cognition Arousal/Alertness: Awake/alert Behavior During Therapy: WFL for tasks assessed/performed Overall Cognitive Status: Within Functional Limits for tasks assessed                                        Exercises General Exercises - Lower Extremity Ankle Circles/Pumps: AROM;Both;10 reps;Supine Quad Sets: AROM;Both;10 reps;Supine Heel Slides: AAROM;Both;10 reps;Supine Hip ABduction/ADduction: AAROM;Both;10 reps;Supine    General Comments        Pertinent Vitals/Pain Pain Assessment: No/denies pain    Home Living                      Prior Function            PT Goals (current goals can now be found in the care plan section) Progress towards PT goals: Progressing toward  goals    Frequency    Min 3X/week      PT Plan Current plan remains appropriate    Co-evaluation             End of Session   Activity Tolerance: Patient tolerated treatment well Patient left: in bed;with call bell/phone within reach;with bed alarm set   PT Visit Diagnosis: Muscle weakness (generalized) (M62.81);Difficulty in walking, not elsewhere classified (R26.2)     Time: 4098-1191 PT Time Calculation (min) (ACUTE ONLY): 12 min  Charges:  $Therapeutic Exercise: 8-22 mins                    G Codes:          Weston Anna, MPT Pager: [phone]

## 2017-04-02 NOTE — Progress Notes (Addendum)
S: Has appetite but doesn't like the food.  Dietitian in room to help with preferences O:BP (!) 154/50   Pulse 64   Temp 98.6 F (37 C) (Oral)   Resp (!) 22   Ht 5' 6" (1.676 m)   Wt 55.5 kg (122 lb 5.7 oz)   SpO2 100%   BMI 19.75 kg/m   Intake/Output Summary (Last 24 hours) at 04/02/17 1229 Last data filed at 04/02/17 1022  Gross per 24 hour  Intake              770 ml  Output             2125 ml  Net            -1355 ml   Weight change:  MPN:TIRWE and alert CVS:RRR no rub Resp: decreased BS bases Abd:+ BS NTND Ext: No edema NEURO:CNI Ox3 no asterixis GU: foley in place   . brinzolamide  1 drop Left Eye Q12H  . budesonide (PULMICORT) nebulizer solution  0.25 mg Nebulization BID  . carvedilol  25 mg Oral BID WC  . cefTRIAXone (ROCEPHIN)  IV  2 g Intravenous Q24H  . clopidogrel  75 mg Oral Daily  . ferrous sulfate  325 mg Oral Q breakfast  . furosemide  160 mg Oral BID  . latanoprost  1 drop Both Eyes QHS  . saccharomyces boulardii  250 mg Oral BID  . sodium bicarbonate  650 mg Oral BID  . tamsulosin  0.4 mg Oral QPC supper   No results found. BMET    Component Value Date/Time   NA 143 04/02/2017 0630   K 3.7 04/02/2017 0630   CL 111 04/02/2017 0630   CO2 20 (L) 04/02/2017 0630   GLUCOSE 100 (H) 04/02/2017 0630   GLUCOSE 84 10/07/2006 1055   BUN 96 (H) 04/02/2017 0630   CREATININE 5.58 (H) 04/02/2017 0630   CREATININE 1.91 (H) 06/27/2014 0926   CALCIUM 8.4 (L) 04/02/2017 0630   GFRNONAA 8 (L) 04/02/2017 0630   GFRNONAA 41 (L) 10/27/2013 0854   GFRAA 10 (L) 04/02/2017 0630   GFRAA 48 (L) 10/27/2013 0854   CBC    Component Value Date/Time   WBC 5.8 04/02/2017 0630   RBC 2.80 (L) 04/02/2017 0630   HGB 7.8 (L) 04/02/2017 0630   HGB 14.3 09/30/2007 1046   HCT 24.2 (L) 04/02/2017 0630   HCT 39.8 09/30/2007 1046   PLT 44 (L) 04/02/2017 0630   PLT 125 (L) 09/30/2007 1046   MCV 86.4 04/02/2017 0630   MCV 96.0 09/30/2007 1046   MCH 27.9 04/02/2017 0630   MCHC 32.2 04/02/2017 0630   RDW 21.4 (H) 04/02/2017 0630   RDW 13.1 09/30/2007 1046   LYMPHSABS 0.5 (L) 04/02/2017 0630   LYMPHSABS 1.7 09/30/2007 1046   MONOABS 0.5 04/02/2017 0630   MONOABS 0.7 09/30/2007 1046   EOSABS 0.1 04/02/2017 0630   EOSABS 0.1 09/30/2007 1046   BASOSABS 0.0 04/02/2017 0630   BASOSABS 0.0 09/30/2007 1046     Assessment:  1. Acute on CKD 3 in setting of Klebsiella Oxytoca bacteremia.  UO good and seemed to improve after catheter placed (though also received dose of lasix) but Scr higher 2. Klebsiella UTI and bacteremia 3. Met acidosis on oral bicarb 4. HTN 5. Chronic thrombocytopenia 6. Anemia and Fe def  Plan: 1. Not sure if renal fx is going to turn around before the need for HD.  I think he should be transferred to [facility]  sooner rather than later (ie today) in case needs HD in next day or so 2. IV Iron 3. Cont lasix 4. Daily labs 5. DC PO iron  Nicholas Porter T

## 2017-04-03 DIAGNOSIS — I779 Disorder of arteries and arterioles, unspecified: Secondary | ICD-10-CM

## 2017-04-03 DIAGNOSIS — B961 Klebsiella pneumoniae [K. pneumoniae] as the cause of diseases classified elsewhere: Secondary | ICD-10-CM

## 2017-04-03 DIAGNOSIS — N390 Urinary tract infection, site not specified: Secondary | ICD-10-CM

## 2017-04-03 LAB — RENAL FUNCTION PANEL
Albumin: 2 g/dL — ABNORMAL LOW (ref 3.5–5.0)
Anion gap: 14 (ref 5–15)
BUN: 117 mg/dL — ABNORMAL HIGH (ref 6–20)
CO2: 21 mmol/L — ABNORMAL LOW (ref 22–32)
Calcium: 8.2 mg/dL — ABNORMAL LOW (ref 8.9–10.3)
Chloride: 104 mmol/L (ref 101–111)
Creatinine, Ser: 5.55 mg/dL — ABNORMAL HIGH (ref 0.61–1.24)
GFR calc Af Amer: 10 mL/min — ABNORMAL LOW (ref >60)
GFR, EST NON AFRICAN AMERICAN: 8 mL/min — AB (ref >60)
Glucose, Bld: 109 mg/dL — ABNORMAL HIGH (ref 65–99)
PHOSPHORUS: 6.2 mg/dL — AB (ref 2.5–4.6)
POTASSIUM: 3.1 mmol/L — AB (ref 3.5–5.1)
Sodium: 139 mmol/L (ref 135–145)

## 2017-04-03 LAB — PARATHYROID HORMONE, INTACT (NO CA): PTH: 142 pg/mL — AB (ref 15–65)

## 2017-04-03 MED ORDER — POTASSIUM CHLORIDE CRYS ER 20 MEQ PO TBCR: 40.0000 meq | EXTENDED_RELEASE_TABLET | Freq: Once | ORAL | Status: DC

## 2017-04-03 MED ORDER — POTASSIUM CHLORIDE 20 MEQ PO PACK
20.0000 meq | PACK | Freq: Once | ORAL | Status: AC
  Administered 2017-04-03: 20 meq via ORAL
  Filled 2017-04-03: qty 1

## 2017-04-03 MED ORDER — POTASSIUM CHLORIDE CRYS ER 20 MEQ PO TBCR: 40.0000 meq | EXTENDED_RELEASE_TABLET | Freq: Two times a day (BID) | ORAL | Status: DC

## 2017-04-03 NOTE — Progress Notes (Signed)
S: Eating breakfast.  CO dry mouth O:BP (!) 164/56 (BP Location: Left Arm)   Pulse 61   Temp 98.2 F (36.8 C) (Oral)   Resp 16   Ht 5' 6" (1.676 m)   Wt 59.2 kg (130 lb 8.2 oz)   SpO2 98%   BMI 21.07 kg/m   Intake/Output Summary (Last 24 hours) at 04/03/17 0827 Last data filed at 04/03/17 0825  Gross per 24 hour  Intake              390 ml  Output             2525 ml  Net            -2135 ml   Weight change:  PPI:RJJOA and alert CVS:RRR no rub Resp: decreased BS bases Abd:+ BS NTND Ext: No edema NEURO:CNI Ox3 no asterixis GU: foley in place   . brinzolamide  1 drop Left Eye Q12H  . budesonide (PULMICORT) nebulizer solution  0.25 mg Nebulization BID  . carvedilol  25 mg Oral BID WC  . cefTRIAXone (ROCEPHIN)  IV  2 g Intravenous Q24H  . clopidogrel  75 mg Oral Daily  . ferumoxytol  510 mg Intravenous Weekly  . furosemide  160 mg Oral BID  . latanoprost  1 drop Both Eyes QHS  . saccharomyces boulardii  250 mg Oral BID  . sodium bicarbonate  650 mg Oral BID  . tamsulosin  0.4 mg Oral QPC supper   No results found. BMET    Component Value Date/Time   NA 139 04/03/2017 0422   K 3.1 (L) 04/03/2017 0422   CL 104 04/03/2017 0422   CO2 21 (L) 04/03/2017 0422   GLUCOSE 109 (H) 04/03/2017 0422   GLUCOSE 84 10/07/2006 1055   BUN 117 (H) 04/03/2017 0422   CREATININE 5.55 (H) 04/03/2017 0422   CREATININE 1.91 (H) 06/27/2014 0926   CALCIUM 8.2 (L) 04/03/2017 0422   GFRNONAA 8 (L) 04/03/2017 0422   GFRNONAA 41 (L) 10/27/2013 0854   GFRAA 10 (L) 04/03/2017 0422   GFRAA 48 (L) 10/27/2013 0854   CBC    Component Value Date/Time   WBC 5.8 04/02/2017 0630   RBC 2.80 (L) 04/02/2017 0630   HGB 7.8 (L) 04/02/2017 0630   HGB 14.3 09/30/2007 1046   HCT 24.2 (L) 04/02/2017 0630   HCT 39.8 09/30/2007 1046   PLT 44 (L) 04/02/2017 0630   PLT 125 (L) 09/30/2007 1046   MCV 86.4 04/02/2017 0630   MCV 96.0 09/30/2007 1046   MCH 27.9 04/02/2017 0630   MCHC 32.2 04/02/2017 0630    RDW 21.4 (H) 04/02/2017 0630   RDW 13.1 09/30/2007 1046   LYMPHSABS 0.5 (L) 04/02/2017 0630   LYMPHSABS 1.7 09/30/2007 1046   MONOABS 0.5 04/02/2017 0630   MONOABS 0.7 09/30/2007 1046   EOSABS 0.1 04/02/2017 0630   EOSABS 0.1 09/30/2007 1046   BASOSABS 0.0 04/02/2017 0630   BASOSABS 0.0 09/30/2007 1046     Assessment:  1. Acute on CKD 3 in setting of Klebsiella Oxytoca bacteremia.  UO excellent, Scr stable 2. Klebsiella UTI and bacteremia 3. Met acidosis on oral bicarb 4. HTN 5. Chronic thrombocytopenia 6. Anemia and Fe def,  IV iron 7. Mild hypokalemia  Plan: 1. Hold PO lasix for today and use PRN 2.  Renal fx stable so hopefully will see improvement over next few days 3. Daily Scr 4. Replace K  Nicholas Porter

## 2017-04-03 NOTE — Progress Notes (Signed)
Date: April 03, 2017 Discharge orders review for case management needs.  Patient transferred to [facility] for renal dialysis on pm of [id number] Velva Harman, [place], [place], [place]: [phone]

## 2017-04-03 NOTE — Progress Notes (Signed)
Wife called to ask about pt's condition. This RN gave update.   Paulla Fore, RN

## 2017-04-03 NOTE — Progress Notes (Signed)
PROGRESS NOTE  Nicholas Porter QMG:[id number] DOB: March 18, 1930 DOA: 03/28/2017 PCP: Cathlean Cower, MD  Brief History:  83 y.o.malewith medical history significant of CKD, HTN, CAD comes in with fever, confusion and near syncope event. Found to have sepsis due to UTI most likely.  Still with elevated lactic acid and experienced acute resp distress. BP difficult to control on current therapy.  Pt was transferred to [facility]  Assessment/Plan: Sepsis  -due to klebsiella Oxytoca Urinary tract infection and bacteremia -continue ceftriaxone 2 grams daily--D#1 of 14 was 03/31/17 -sepsis physiology resolving/improved. -will continue supportive care  Klebsiella bacteremia and UTI -continue ceftriaxone 2 grams daily--D#1 of 14 was 03/31/17  Acute kidney injury on chronic kidney disease stage 3 -Baseline creatinine of 1.4-1.6 -Renal ultrasound-- Mild prominence of the central left renal collecting system not seen previously. No gross hydronephrosis or caliectasis. -continue holding nephrotoxic agents -appreciate nephrology follow up -Nephrology team following closely anticipating need to start HD treatments  Volume overload  -secondary to acute on chronic renal failure -03/29/17 CXR--pulmonary vascular congestion -continue lasix -stable on 2 L-->100%  Essential hypertension -HoldingARB due to acute kidney injury -Continue PRN labetalol -continue coreg 25 mg/BID -overall better  CAD/elevated troponin  -Currently stable, no complaints of CP -troponin most likely elevated in presence of demand ischemia and acute renal failure.  -Continue plavix and B-blocker  -recent outpatient stress test and low risk for ischemia -03/30/17-- echo with global hypokinesis (no focal wall abnormalities), EF 45-50%  Chronic anemia of chronic renal failure -no signs of acute bleeding -baseline Hgb~8-9 - follow Hgb trend -IV iron per renal  Diarrhea -C. Diff neg -continue supportive  care -started on florastor -will use PRN lomotil as well   Metabolic/Lactic acidosis -due to infection and acute on chronic renal failure -continue po bicarb -given resp distress and edema seen on CXR IVF's stopped on 4/9.  Suspected BPH -patient decrease urine stream and also with nocturia (prior to admission) -continue flomax -had already follow up up arranged with urology service after discharge -renal US--Mild prominence of the central left renal collecting system not seen previously. No gross hydronephrosis or caliectasis.  Thrombocytopenia -due to sepsis -trend   Disposition Plan:  TBD Family Communication:   Family at bedside  Consultants:  nephrology  Code Status:  DNR  DVT Prophylaxis:  SCDs  Subjective: Patient reports that he is starting to feel a little better but not at his baseline   Objective: Vitals:   04/03/17 0510 04/03/17 0739 04/03/17 0827 04/03/17 0900  BP: (!) 164/56  (!) 165/53 (!) 162/48  Pulse: 61  63 64  Resp: 16   16  Temp: 98.2 F (36.8 C)   97.7 F (36.5 C)  TempSrc: Oral   Oral  SpO2: 99% 98% 100% 100%  Weight:      Height:        Intake/Output Summary (Last 24 hours) at 04/03/17 1106 Last data filed at 04/03/17 1031  Gross per 24 hour  Intake              450 ml  Output             2825 ml  Net            -2375 ml   Weight change:  Exam:   General:  Pt is alert, follows commands appropriately, not in acute distress  HEENT: No icterus, No thrush, No neck mass, [place]/AT  Cardiovascular: RRR,  S1/S2, no rubs, no gallops  Respiratory: bibasilar crackles, no wheezing, no crackles, no rhonchi  Abdomen: Soft/+BS, non tender, non distended, no guarding  Extremities: No edema, No lymphangitis, No petechiae, No rashes, no synovitis   Data Reviewed: I have personally reviewed following labs and imaging studies Basic Metabolic Panel:  Recent Labs Lab 03/30/17 0629 03/31/17 0422 04/01/17 0623 04/02/17 0630 04/03/17 0422   NA 141 140 142 143 139  K 4.5 3.9 3.6 3.7 3.1*  CL 111 111 113* 111 104  CO2 19* 17* 18* 20* 21*  GLUCOSE 129* 128* 112* 100* 109*  BUN 67* 85* 99* 96* 117*  CREATININE 4.10* 4.55* 5.03* 5.58* 5.55*  CALCIUM 7.5* 7.5* 7.9* 8.4* 8.2*  PHOS  --   --   --  5.8* 6.2*   Liver Function Tests:  Recent Labs Lab 03/28/17 2140 04/02/17 0630 04/03/17 0422  AST 30  --   --   ALT 11*  --   --   ALKPHOS 71  --   --   BILITOT 0.8  --   --   PROT 6.6  --   --   ALBUMIN 3.3* 2.4* 2.0*   No results for input(s): LIPASE, AMYLASE in the last 168 hours. No results for input(s): AMMONIA in the last 168 hours. Coagulation Profile:  Recent Labs Lab 03/28/17 2140  INR 1.26   CBC:  Recent Labs Lab 03/28/17 2140 03/29/17 0332 04/02/17 0630  WBC 8.1 15.1* 5.8  NEUTROABS 7.8* 13.9* 4.7  HGB 9.5* 9.3* 7.8*  HCT 30.0* 29.5* 24.2*  MCV 89.8 90.5 86.4  PLT 76* 53* 44*   Cardiac Enzymes:  Recent Labs Lab 03/29/17 1913 03/30/17 0012 03/30/17 0629  TROPONINI 0.82* 0.93* 0.84*   BNP: Invalid input(s): POCBNP CBG: No results for input(s): GLUCAP in the last 168 hours. HbA1C: No results for input(s): HGBA1C in the last 72 hours. Urine analysis:    Component Value Date/Time   COLORURINE YELLOW 04/01/2017 1702   APPEARANCEUR HAZY (A) 04/01/2017 1702   LABSPEC 1.013 04/01/2017 1702   PHURINE 5.0 04/01/2017 1702   GLUCOSEU NEGATIVE 04/01/2017 1702   GLUCOSEU NEGATIVE 03/11/2017 1605   HGBUR SMALL (A) 04/01/2017 1702   HGBUR negative 12/06/2009 0836   BILIRUBINUR NEGATIVE 04/01/2017 1702   BILIRUBINUR negative 02/13/2017 1025   KETONESUR NEGATIVE 04/01/2017 1702   PROTEINUR NEGATIVE 04/01/2017 1702   UROBILINOGEN 0.2 03/11/2017 1605   NITRITE NEGATIVE 04/01/2017 1702   LEUKOCYTESUR NEGATIVE 04/01/2017 1702    Recent Results (from the past 240 hour(s))  Culture, blood (Routine x 2)     Status: Abnormal   Collection Time: 03/28/17  9:40 PM  Result Value Ref Range Status    Specimen Description BLOOD LAC  Final   Special Requests BOTTLES DRAWN AEROBIC AND ANAEROBIC BCAV  Final   Culture  Setup Time   Final    GRAM NEGATIVE RODS IN BOTH AEROBIC AND ANAEROBIC BOTTLES CRITICAL RESULT CALLED TO, READ BACK BY AND VERIFIED WITH: D. WOFFARD, PHARM AT [facility], 03/29/17 AT 1624 BY J FUDESCO Performed at [facility], [place] [address], [place], [place] [address]    Culture KLEBSIELLA OXYTOCA (A)  Final   Report Status 03/31/2017 FINAL  Final   Organism ID, Bacteria KLEBSIELLA OXYTOCA  Final      Susceptibility   Klebsiella oxytoca - MIC*    AMPICILLIN >=32 RESISTANT Resistant     CEFAZOLIN 8 SENSITIVE Sensitive     CEFEPIME <=1 SENSITIVE Sensitive  CEFTAZIDIME <=1 SENSITIVE Sensitive     CEFTRIAXONE <=1 SENSITIVE Sensitive     CIPROFLOXACIN <=0.25 SENSITIVE Sensitive     GENTAMICIN <=1 SENSITIVE Sensitive     IMIPENEM <=0.25 SENSITIVE Sensitive     TRIMETH/SULFA <=20 SENSITIVE Sensitive     AMPICILLIN/SULBACTAM 16 INTERMEDIATE Intermediate     PIP/TAZO <=4 SENSITIVE Sensitive     Extended ESBL NEGATIVE Sensitive     * KLEBSIELLA OXYTOCA  Blood Culture ID Panel (Reflexed)     Status: Abnormal   Collection Time: 03/28/17  9:40 PM  Result Value Ref Range Status   Enterococcus species NOT DETECTED NOT DETECTED Final   Listeria monocytogenes NOT DETECTED NOT DETECTED Final   Staphylococcus species NOT DETECTED NOT DETECTED Final   Staphylococcus aureus NOT DETECTED NOT DETECTED Final   Streptococcus species NOT DETECTED NOT DETECTED Final   Streptococcus agalactiae NOT DETECTED NOT DETECTED Final   Streptococcus pneumoniae NOT DETECTED NOT DETECTED Final   Streptococcus pyogenes NOT DETECTED NOT DETECTED Final   Acinetobacter baumannii NOT DETECTED NOT DETECTED Final   Enterobacteriaceae species DETECTED (A) NOT DETECTED Final    Comment: Enterobacteriaceae represent a large family of gram-negative bacteria, not a single organism. CRITICAL RESULT CALLED TO,  READ BACK BY AND VERIFIED WITH: D. WOFFARD, [facility], 03/29/17 AT 1623 BY J FUDESCO    Enterobacter cloacae complex NOT DETECTED NOT DETECTED Final   Escherichia coli NOT DETECTED NOT DETECTED Final   Klebsiella oxytoca DETECTED (A) NOT DETECTED Final    Comment: CRITICAL RESULT CALLED TO, READ BACK BY AND VERIFIED WITH: D. WOFFARD, PH[facility], 03/29/17 AT 1623 BY J FUDESCO    Klebsiella pneumoniae NOT DETECTED NOT DETECTED Final   Proteus species NOT DETECTED NOT DETECTED Final   Serratia marcescens NOT DETECTED NOT DETECTED Final   Carbapenem resistance NOT DETECTED NOT DETECTED Final   Haemophilus influenzae NOT DETECTED NOT DETECTED Final   Neisseria meningitidis NOT DETECTED NOT DETECTED Final   Pseudomonas aeruginosa NOT DETECTED NOT DETECTED Final   Candida albicans NOT DETECTED NOT DETECTED Final   Candida glabrata NOT DETECTED NOT DETECTED Final   Candida krusei NOT DETECTED NOT DETECTED Final   Candida parapsilosis NOT DETECTED NOT DETECTED Final   Candida tropicalis NOT DETECTED NOT DETECTED Final    Comment: Performed at [facility], [address], [place], [place] [address]  Culture, blood (Routine x 2)     Status: Abnormal   Collection Time: 03/28/17  9:45 PM  Result Value Ref Range Status   Specimen Description BLOOD RIGHT ARM  Final   Special Requests IN PEDIATRIC BOTTLE BCAV  Final   Culture  Setup Time   Final    GRAM NEGATIVE RODS IN PEDIATRIC BOTTLE CRITICAL VALUE NOTED.  VALUE IS CONSISTENT WITH PREVIOUSLY REPORTED AND CALLED VALUE.    Culture (A)  Final    KLEBSIELLA OXYTOCA SUSCEPTIBILITIES PERFORMED ON PREVIOUS CULTURE WITHIN THE LAST 5 DAYS. Performed at [facility], [place] [address] [place], [place] [address]    Report Status 03/31/2017 FINAL  Final  Urine culture     Status: Abnormal   Collection Time: 03/28/17 11:57 PM  Result Value Ref Range Status   Specimen Description URINE, CLEAN CATCH  Final   Special Requests NONE  Final    Culture >=100,000 COLONIES/mL KLEBSIELLA OXYTOCA (A)  Final   Report Status 03/31/2017 FINAL  Final   Organism ID, Bacteria KLEBSIELLA OXYTOCA (A)  Final  Susceptibility   Klebsiella oxytoca - MIC*    AMPICILLIN >=32 RESISTANT Resistant     CEFAZOLIN 8 SENSITIVE Sensitive     CEFTRIAXONE <=1 SENSITIVE Sensitive     CIPROFLOXACIN <=0.25 SENSITIVE Sensitive     GENTAMICIN <=1 SENSITIVE Sensitive     IMIPENEM <=0.25 SENSITIVE Sensitive     NITROFURANTOIN 32 SENSITIVE Sensitive     TRIMETH/SULFA <=20 SENSITIVE Sensitive     AMPICILLIN/SULBACTAM 16 INTERMEDIATE Intermediate     PIP/TAZO <=4 SENSITIVE Sensitive     Extended ESBL NEGATIVE Sensitive     * >=100,000 COLONIES/mL KLEBSIELLA OXYTOCA  MRSA PCR Screening     Status: None   Collection Time: 04-21-17  1:14 AM  Result Value Ref Range Status   MRSA by PCR NEGATIVE NEGATIVE Final    Comment:        The GeneXpert MRSA Assay (FDA approved for NASAL specimens only), is one component of a comprehensive MRSA colonization surveillance program. It is not intended to diagnose MRSA infection nor to guide or monitor treatment for MRSA infections.   C difficile quick scan w PCR reflex     Status: None   Collection Time: 04-21-2017  3:08 PM  Result Value Ref Range Status   C Diff antigen NEGATIVE NEGATIVE Final   C Diff toxin NEGATIVE NEGATIVE Final   C Diff interpretation No C. difficile detected.  Final     Scheduled Meds: . brinzolamide  1 drop Left Eye Q12H  . budesonide (PULMICORT) nebulizer solution  0.25 mg Nebulization BID  . carvedilol  25 mg Oral BID WC  . cefTRIAXone (ROCEPHIN)  IV  2 g Intravenous Q24H  . clopidogrel  75 mg Oral Daily  . ferumoxytol  510 mg Intravenous Weekly  . latanoprost  1 drop Both Eyes QHS  . saccharomyces boulardii  250 mg Oral BID  . sodium bicarbonate  650 mg Oral BID  . tamsulosin  0.4 mg Oral QPC supper   Continuous Infusions:  Procedures/Studies: US Renal  Result Date:  April 21, 2017 CLINICAL DATA:  Acute renal injury EXAM: RENAL / URINARY TRACT ULTRASOUND COMPLETE COMPARISON:  February 24, 2017 FINDINGS: Right Kidney: Length: 9 cm. Increased cortical echogenicity. No mass or hydronephrosis. Left Kidney: Length: 8.5 cm. Mild prominence of the central left renal collecting system as seen on image 26 without gross hydronephrosis. This was not visualized previously. Bladder: The bladder is decompressed and poorly evaluated but grossly unremarkable. IMPRESSION: Mild prominence of the central left renal collecting system not seen previously. No gross hydronephrosis or caliectasis. Recommend clinical correlation. Electronically Signed   By: Dorise Bullion III M.D   On: April 21, 2017 09:43   Dg Chest Port 1 View  Result Date: Apr 21, 2017 CLINICAL DATA:  Shortness of breath. History carneae artery disease, inferior STEMI and mitral regurgitation. History of cardiac catheterization. Former smoker. EXAM: PORTABLE CHEST 1 VIEW COMPARISON:  Chest x-rays dated 03/28/2017, 02/23/2017 and 07/29/2015 FINDINGS: Heart size is upper normal. Atherosclerotic changes noted at the aortic arch. There is new central pulmonary vascular congestion and hazy opacities at each lung base which likely represents associated interstitial edema. No significant pleural effusion. No pneumothorax seen. IMPRESSION: 1. Central pulmonary vascular congestion, and probable bibasilar interstitial edema, suggesting mild volume overload/CHF. 2.  Aortic atherosclerosis. Electronically Signed   By: Franki Cabot M.D.   On: 2017-04-21 18:16   Dg Chest Port 1 View  Result Date: 03/28/2017 CLINICAL DATA:  Weakness and confusion tonight.  Near syncope. EXAM: PORTABLE CHEST 1 VIEW COMPARISON:  02/23/2017 FINDINGS: AP portable views of the chest demonstrate no focal airspace consolidation or alveolar edema. The lungs are grossly clear. There is no large effusion or pneumothorax. Cardiac and mediastinal contours appear unremarkable.  IMPRESSION: No active disease. Electronically Signed   By: Andreas Newport M.D.   On: 03/28/2017 22:09    Irwin Brakeman, MD  [facility] Pager [phone]  If 7PM-7AM, please contact night-coverage [email or web address] Password [email or web address] 04/03/2017, 11:06 AM   LOS: 6 days

## 2017-04-04 ENCOUNTER — Inpatient Hospital Stay (HOSPITAL_COMMUNITY): Payer: Medicare Other

## 2017-04-04 ENCOUNTER — Telehealth: Payer: Self-pay | Admitting: Internal Medicine

## 2017-04-04 LAB — MAGNESIUM: MAGNESIUM: 1.9 mg/dL (ref 1.7–2.4)

## 2017-04-04 LAB — CBC
HCT: 26.7 % — ABNORMAL LOW (ref 39.0–52.0)
Hemoglobin: 8.5 g/dL — ABNORMAL LOW (ref 13.0–17.0)
MCH: 27.2 pg (ref 26.0–34.0)
MCHC: 31.8 g/dL (ref 30.0–36.0)
MCV: 85.6 fL (ref 78.0–100.0)
PLATELETS: 51 10*3/uL — AB (ref 150–400)
RBC: 3.12 MIL/uL — AB (ref 4.22–5.81)
RDW: 20.1 % — AB (ref 11.5–15.5)
WBC: 11.7 10*3/uL — AB (ref 4.0–10.5)

## 2017-04-04 LAB — RENAL FUNCTION PANEL
ALBUMIN: 2.1 g/dL — AB (ref 3.5–5.0)
Anion gap: 15 (ref 5–15)
BUN: 124 mg/dL — AB (ref 6–20)
CALCIUM: 8.2 mg/dL — AB (ref 8.9–10.3)
CO2: 23 mmol/L (ref 22–32)
CREATININE: 5.59 mg/dL — AB (ref 0.61–1.24)
Chloride: 101 mmol/L (ref 101–111)
GFR, EST AFRICAN AMERICAN: 10 mL/min — AB (ref >60)
GFR, EST NON AFRICAN AMERICAN: 8 mL/min — AB (ref >60)
Glucose, Bld: 105 mg/dL — ABNORMAL HIGH (ref 65–99)
Phosphorus: 6.5 mg/dL — ABNORMAL HIGH (ref 2.5–4.6)
Potassium: 2.8 mmol/L — ABNORMAL LOW (ref 3.5–5.1)
SODIUM: 139 mmol/L (ref 135–145)

## 2017-04-04 MED ORDER — POTASSIUM CHLORIDE CRYS ER 20 MEQ PO TBCR
60.0000 meq | EXTENDED_RELEASE_TABLET | Freq: Once | ORAL | Status: AC
  Administered 2017-04-04: 60 meq via ORAL
  Filled 2017-04-04: qty 3

## 2017-04-04 NOTE — Telephone Encounter (Signed)
Pt has been referred to urologists, his appt is  4/30 w/ urologists, pt is in hospi

## 2017-04-04 NOTE — Progress Notes (Signed)
PROGRESS NOTE  Nicholas Porter WIO:[id number] DOB: 03/23/1930 DOA: 03/28/2017 PCP: Cathlean Cower, MD  Brief History:  83 y.o.malewith medical history significant of CKD, HTN, CAD comes in with fever, confusion and near syncope event. Found to have sepsis due to UTI most likely.  Still with elevated lactic acid and experienced acute resp distress. BP difficult to control on current therapy.  Pt was transferred to [facility] 4/11 for higher level nephrology services if needed.   Assessment/Plan: Sepsis  -due to klebsiella Oxytoca Urinary tract infection and bacteremia -continue ceftriaxone 2 grams daily--D#1 of 14 was 03/31/17 -sepsis physiology resolving/improved. -will continue supportive care  Klebsiella bacteremia and UTI -continue ceftriaxone 2 grams daily--D#1 of 14 was 03/31/17  Acute kidney injury on chronic kidney disease stage 3 -Baseline creatinine of 1.4-1.6 -Renal ultrasound-- Mild prominence of the central left renal collecting system not seen previously. No gross hydronephrosis or caliectasis. -continue holding nephrotoxic agents -appreciate nephrology follow up -Nephrology team following closely anticipating need to start HD treatments  Volume overload  -secondary to acute on chronic renal failure -03/29/17 CXR--pulmonary vascular congestion -continue lasix -stable on 2 L-->100%  Essential hypertension -HoldingARB due to acute kidney injury -Continue PRN labetalol -continue coreg 25 mg/BID -overall better  CAD/elevated troponin  -Currently stable, no complaints of CP -troponin most likely elevated in presence of demand ischemia and acute renal failure.  -Continue plavix and B-blocker  -recent outpatient stress test and low risk for ischemia -03/30/17-- echo with global hypokinesis (no focal wall abnormalities), EF 45-50%  Chronic anemia of chronic renal failure -no signs of acute bleeding -baseline Hgb~8-9 - follow Hgb trend -IV iron per  renal  Diarrhea -C. Diff neg -continue supportive care -started on florastor -will use PRN lomotil as well   Metabolic/Lactic acidosis -due to infection and acute on chronic renal failure -continue po bicarb -given resp distress and edema seen on CXR IVF's stopped on 4/9.  Suspected BPH -patient decrease urine stream and also with nocturia (prior to admission) -continue flomax -had already follow up up arranged with urology service after discharge -renal US--Mild prominence of the central left renal collecting system not seen previously. No gross hydronephrosis or caliectasis.  Thrombocytopenia -due to sepsis -trend   Disposition Plan:  TBD Family Communication:   Family at bedside  Consultants:  nephrology  Code Status:  DNR  DVT Prophylaxis:  SCDs  Subjective: Patient reports that he has been having loose stools   Objective: Vitals:   04/03/17 1719 04/03/17 1738 04/03/17 2235 04/04/17 0642  BP: (!) 159/50 (!) 157/42 (!) 119/47 (!) 160/51  Pulse: 63 66 64 61  Resp:  16 16 16   Temp:  98.7 F (37.1 C) 98.1 F (36.7 C) 99.1 F (37.3 C)  TempSrc:  Oral Oral Oral  SpO2:  99% 100% 100%  Weight:   56.7 kg (125 lb)   Height:        Intake/Output Summary (Last 24 hours) at 04/04/17 0824 Last data filed at 04/04/17 0646  Gross per 24 hour  Intake              520 ml  Output             3427 ml  Net            -2907 ml   Weight change: -2.449 kg (-5 lb 6.4 oz) Exam:   General:  Pt is alert, follows commands appropriately, not in acute distress  HEENT: No icterus, No thrush, No neck mass, [place]/AT  Cardiovascular: RRR, S1/S2, no rubs, no gallops  Respiratory: bibasilar crackles, no wheezing, no crackles, no rhonchi  Abdomen: Soft/+BS, non tender, non distended, no guarding  Extremities: No edema, No lymphangitis, No petechiae, No rashes, no synovitis   Data Reviewed: I have personally reviewed following labs and imaging studies Basic Metabolic  Panel:  Recent Labs Lab 03/31/17 0422 04/01/17 0623 04/02/17 0630 04/03/17 0422 04/04/17 0501  NA 140 142 143 139 139  K 3.9 3.6 3.7 3.1* 2.8*  CL 111 113* 111 104 101  CO2 17* 18* 20* 21* 23  GLUCOSE 128* 112* 100* 109* 105*  BUN 85* 99* 96* 117* 124*  CREATININE 4.55* 5.03* 5.58* 5.55* 5.59*  CALCIUM 7.5* 7.9* 8.4* 8.2* 8.2*  PHOS  --   --  5.8* 6.2* 6.5*   Liver Function Tests:  Recent Labs Lab 03/28/17 2140 04/02/17 0630 04/03/17 0422 04/04/17 0501  AST 30  --   --   --   ALT 11*  --   --   --   ALKPHOS 71  --   --   --   BILITOT 0.8  --   --   --   PROT 6.6  --   --   --   ALBUMIN 3.3* 2.4* 2.0* 2.1*   No results for input(s): LIPASE, AMYLASE in the last 168 hours. No results for input(s): AMMONIA in the last 168 hours. Coagulation Profile:  Recent Labs Lab 03/28/17 2140  INR 1.26   CBC:  Recent Labs Lab 03/28/17 2140 03/29/17 0332 04/02/17 0630 04/04/17 0501  WBC 8.1 15.1* 5.8 11.7*  NEUTROABS 7.8* 13.9* 4.7  --   HGB 9.5* 9.3* 7.8* 8.5*  HCT 30.0* 29.5* 24.2* 26.7*  MCV 89.8 90.5 86.4 85.6  PLT 76* 53* 44* 51*   Cardiac Enzymes:  Recent Labs Lab 03/29/17 1913 03/30/17 0012 03/30/17 0629  TROPONINI 0.82* 0.93* 0.84*   BNP: Invalid input(s): POCBNP CBG: No results for input(s): GLUCAP in the last 168 hours. HbA1C: No results for input(s): HGBA1C in the last 72 hours. Urine analysis:    Component Value Date/Time   COLORURINE YELLOW 04/01/2017 1702   APPEARANCEUR HAZY (A) 04/01/2017 1702   LABSPEC 1.013 04/01/2017 1702   PHURINE 5.0 04/01/2017 1702   GLUCOSEU NEGATIVE 04/01/2017 1702   GLUCOSEU NEGATIVE 03/11/2017 1605   HGBUR SMALL (A) 04/01/2017 1702   HGBUR negative 12/06/2009 0836   BILIRUBINUR NEGATIVE 04/01/2017 1702   BILIRUBINUR negative 02/13/2017 1025   KETONESUR NEGATIVE 04/01/2017 1702   PROTEINUR NEGATIVE 04/01/2017 1702   UROBILINOGEN 0.2 03/11/2017 1605   NITRITE NEGATIVE 04/01/2017 1702   LEUKOCYTESUR  NEGATIVE 04/01/2017 1702    Recent Results (from the past 240 hour(s))  Culture, blood (Routine x 2)     Status: Abnormal   Collection Time: 03/28/17  9:40 PM  Result Value Ref Range Status   Specimen Description BLOOD LAC  Final   Special Requests BOTTLES DRAWN AEROBIC AND ANAEROBIC BCAV  Final   Culture  Setup Time   Final    GRAM NEGATIVE RODS IN BOTH AEROBIC AND ANAEROBIC BOTTLES CRITICAL RESULT CALLED TO, READ BACK BY AND VERIFIED WITH: D. WOFFARD, PHARM AT [facility], 03/29/17 AT 1624 BY J FUDESCO Performed at [facility], [place] [address], [place], [place] [address]    Culture KLEBSIELLA OXYTOCA (A)  Final   Report Status 03/31/2017 FINAL  Final   Organism ID, Bacteria KLEBSIELLA OXYTOCA  Final      Susceptibility   Klebsiella oxytoca - MIC*    AMPICILLIN >=32 RESISTANT Resistant     CEFAZOLIN 8 SENSITIVE Sensitive     CEFEPIME <=1 SENSITIVE Sensitive     CEFTAZIDIME <=1 SENSITIVE Sensitive     CEFTRIAXONE <=1 SENSITIVE Sensitive     CIPROFLOXACIN <=0.25 SENSITIVE Sensitive     GENTAMICIN <=1 SENSITIVE Sensitive     IMIPENEM <=0.25 SENSITIVE Sensitive     TRIMETH/SULFA <=20 SENSITIVE Sensitive     AMPICILLIN/SULBACTAM 16 INTERMEDIATE Intermediate     PIP/TAZO <=4 SENSITIVE Sensitive     Extended ESBL NEGATIVE Sensitive     * KLEBSIELLA OXYTOCA  Blood Culture ID Panel (Reflexed)     Status: Abnormal   Collection Time: 03/28/17  9:40 PM  Result Value Ref Range Status   Enterococcus species NOT DETECTED NOT DETECTED Final   Listeria monocytogenes NOT DETECTED NOT DETECTED Final   Staphylococcus species NOT DETECTED NOT DETECTED Final   Staphylococcus aureus NOT DETECTED NOT DETECTED Final   Streptococcus species NOT DETECTED NOT DETECTED Final   Streptococcus agalactiae NOT DETECTED NOT DETECTED Final   Streptococcus pneumoniae NOT DETECTED NOT DETECTED Final   Streptococcus pyogenes NOT DETECTED NOT DETECTED Final   Acinetobacter baumannii NOT DETECTED NOT DETECTED Final    Enterobacteriaceae species DETECTED (A) NOT DETECTED Final    Comment: Enterobacteriaceae represent a large family of gram-negative bacteria, not a single organism. CRITICAL RESULT CALLED TO, READ BACK BY AND VERIFIED WITH: D. WOFFARD, PH[facility] AT [facility], 03/29/17 AT 1623 BY J FUDESCO    Enterobacter cloacae complex NOT DETECTED NOT DETECTED Final   Escherichia coli NOT DETECTED NOT DETECTED Final   Klebsiella oxytoca DETECTED (A) NOT DETECTED Final    Comment: CRITICAL RESULT CALLED TO, READ BACK BY AND VERIFIED WITH: D. WOFFARD, PH[facility] AT [facility], 03/29/17 AT 1623 BY J FUDESCO    Klebsiella pneumoniae NOT DETECTED NOT DETECTED Final   Proteus species NOT DETECTED NOT DETECTED Final   Serratia marcescens NOT DETECTED NOT DETECTED Final   Carbapenem resistance NOT DETECTED NOT DETECTED Final   Haemophilus influenzae NOT DETECTED NOT DETECTED Final   Neisseria meningitidis NOT DETECTED NOT DETECTED Final   Pseudomonas aeruginosa NOT DETECTED NOT DETECTED Final   Candida albicans NOT DETECTED NOT DETECTED Final   Candida glabrata NOT DETECTED NOT DETECTED Final   Candida krusei NOT DETECTED NOT DETECTED Final   Candida parapsilosis NOT DETECTED NOT DETECTED Final   Candida tropicalis NOT DETECTED NOT DETECTED Final    Comment: Performed at [facility], [address] [place], [place] [address]  Culture, blood (Routine x 2)     Status: Abnormal   Collection Time: 03/28/17  9:45 PM  Result Value Ref Range Status   Specimen Description BLOOD RIGHT ARM  Final   Special Requests IN PEDIATRIC BOTTLE BCAV  Final   Culture  Setup Time   Final    GRAM NEGATIVE RODS IN PEDIATRIC BOTTLE CRITICAL VALUE NOTED.  VALUE IS CONSISTENT WITH PREVIOUSLY REPORTED AND CALLED VALUE.    Culture (A)  Final    KLEBSIELLA OXYTOCA SUSCEPTIBILITIES PERFORMED ON PREVIOUS CULTURE WITHIN THE LAST 5 DAYS. Performed at [facility], [place] [address], [place], [place] [address]    Report Status 03/31/2017 FINAL   Final  Urine culture     Status: Abnormal   Collection Time: 03/28/17 11:57 PM  Result Value Ref Range Status   Specimen Description URINE, CLEAN CATCH  Final   Special Requests NONE  Final   Culture >=100,000 COLONIES/mL KLEBSIELLA OXYTOCA (A)  Final   Report Status 03/31/2017 FINAL  Final   Organism ID, Bacteria KLEBSIELLA OXYTOCA (A)  Final      Susceptibility   Klebsiella oxytoca - MIC*    AMPICILLIN >=32 RESISTANT Resistant     CEFAZOLIN 8 SENSITIVE Sensitive     CEFTRIAXONE <=1 SENSITIVE Sensitive     CIPROFLOXACIN <=0.25 SENSITIVE Sensitive     GENTAMICIN <=1 SENSITIVE Sensitive     IMIPENEM <=0.25 SENSITIVE Sensitive     NITROFURANTOIN 32 SENSITIVE Sensitive     TRIMETH/SULFA <=20 SENSITIVE Sensitive     AMPICILLIN/SULBACTAM 16 INTERMEDIATE Intermediate     PIP/TAZO <=4 SENSITIVE Sensitive     Extended ESBL NEGATIVE Sensitive     * >=100,000 COLONIES/mL KLEBSIELLA OXYTOCA  MRSA PCR Screening     Status: None   Collection Time: 03/29/17  1:14 AM  Result Value Ref Range Status   MRSA by PCR NEGATIVE NEGATIVE Final    Comment:        The GeneXpert MRSA Assay (FDA approved for NASAL specimens only), is one component of a comprehensive MRSA colonization surveillance program. It is not intended to diagnose MRSA infection nor to guide or monitor treatment for MRSA infections.   C difficile quick scan w PCR reflex     Status: None   Collection Time: 03/29/17  3:08 PM  Result Value Ref Range Status   C Diff antigen NEGATIVE NEGATIVE Final   C Diff toxin NEGATIVE NEGATIVE Final   C Diff interpretation No C. difficile detected.  Final     Scheduled Meds: . brinzolamide  1 drop Left Eye Q12H  . budesonide (PULMICORT) nebulizer solution  0.25 mg Nebulization BID  . carvedilol  25 mg Oral BID WC  . cefTRIAXone (ROCEPHIN)  IV  2 g Intravenous Q24H  . clopidogrel  75 mg Oral Daily  . ferumoxytol  510 mg Intravenous Weekly  . latanoprost  1 drop Both Eyes QHS  . potassium  chloride  60 mEq Oral Once  . saccharomyces boulardii  250 mg Oral BID  . sodium bicarbonate  650 mg Oral BID  . tamsulosin  0.4 mg Oral QPC supper   Continuous Infusions:  Procedures/Studies: US Renal  Result Date: 03/29/2017 CLINICAL DATA:  Acute renal injury EXAM: RENAL / URINARY TRACT ULTRASOUND COMPLETE COMPARISON:  February 24, 2017 FINDINGS: Right Kidney: Length: 9 cm. Increased cortical echogenicity. No mass or hydronephrosis. Left Kidney: Length: 8.5 cm. Mild prominence of the central left renal collecting system as seen on image 26 without gross hydronephrosis. This was not visualized previously. Bladder: The bladder is decompressed and poorly evaluated but grossly unremarkable. IMPRESSION: Mild prominence of the central left renal collecting system not seen previously. No gross hydronephrosis or caliectasis. Recommend clinical correlation. Electronically Signed   By: Dorise Bullion III M.D   On: 03/29/2017 09:43   Dg Chest Port 1 View  Result Date: 03/29/2017 CLINICAL DATA:  Shortness of breath. History carneae artery disease, inferior STEMI and mitral regurgitation. History of cardiac catheterization. Former smoker. EXAM: PORTABLE CHEST 1 VIEW COMPARISON:  Chest x-rays dated 03/28/2017, 02/23/2017 and 07/29/2015 FINDINGS: Heart size is upper normal. Atherosclerotic changes noted at the aortic arch. There is new central pulmonary vascular congestion and hazy opacities at each lung base which likely represents associated interstitial edema. No significant pleural effusion. No pneumothorax seen. IMPRESSION: 1. Central pulmonary vascular congestion, and probable bibasilar interstitial  edema, suggesting mild volume overload/CHF. 2.  Aortic atherosclerosis. Electronically Signed   By: Franki Cabot M.D.   On: 03/29/2017 18:16   Dg Chest Port 1 View  Result Date: 03/28/2017 CLINICAL DATA:  Weakness and confusion tonight.  Near syncope. EXAM: PORTABLE CHEST 1 VIEW COMPARISON:  02/23/2017 FINDINGS: AP  portable views of the chest demonstrate no focal airspace consolidation or alveolar edema. The lungs are grossly clear. There is no large effusion or pneumothorax. Cardiac and mediastinal contours appear unremarkable. IMPRESSION: No active disease. Electronically Signed   By: Andreas Newport M.D.   On: 03/28/2017 22:09    Irwin Brakeman, MD  [facility] Pager [phone]  If 7PM-7AM, please contact night-coverage [email or web address] Password [email or web address] 04/04/2017, 8:24 AM   LOS: 7 days

## 2017-04-04 NOTE — Care Management Important Message (Signed)
Important Message  Patient Details  Name: Nicholas Porter MRN: [id number] Date of Birth: 1930/10/28   Medicare Important Message Given:  Yes    Christoph Copelan [address] 04/04/2017, 2:44 PM

## 2017-04-04 NOTE — Care Management Important Message (Signed)
Important Message  Patient Details  Name: Nicholas Porter MRN: [id number] Date of Birth: 15-Jul-1930   Medicare Important Message Given:  Yes    [name] [address] 04/04/2017, 2:44 PM

## 2017-04-04 NOTE — Progress Notes (Addendum)
Physical Therapy Treatment Patient Details Name: Nicholas Porter MRN: [id number] DOB: 1930/07/17 Today's Date: 04/04/2017    History of Present Illness Pt admitted with confusion and near syncope and dx with sepsis 2* UTI.  Pt with hx of DKC, HTN, CAD and MI    PT Comments    Limited session due to pt orthostatic, with BP lying 140/55 and sitting 85/43.  Very symptomatic sitting and standing.   Follow Up Recommendations  Home health PT;Supervision/Assistance - 24 hour     Equipment Recommendations  None recommended by PT    Recommendations for Other Services       Precautions / Restrictions Precautions Precautions: Fall    Mobility  Bed Mobility Overal bed mobility: Needs Assistance Bed Mobility: Supine to Sit     Supine to sit: Min assist     General bed mobility comments: min assist to come up and forward,  assist for scoot  Transfers Overall transfer level: Needs assistance Equipment used: Rolling walker (2 wheeled) Transfers: Sit to/from Stand Sit to Stand: Min assist;+2 safety/equipment         General transfer comment: cues for hand placement, boost and stability assist   Ambulation/Gait Ambulation/Gait assistance: Min assist   Assistive device: Rolling walker (2 wheeled)       General Gait Details: pt immediately start looking bad after 2-3 feet forward and back.  2 person assist to return backward to bed.   Stairs            Wheelchair Mobility    Modified Rankin (Stroke Patients Only)       Balance Overall balance assessment: Needs assistance Sitting-balance support: Single extremity supported;No upper extremity supported Sitting balance-Leahy Scale: Fair       Standing balance-Leahy Scale: Poor Standing balance comment: pt needed external sport plus RW due to unsteadiness, likely orthostatic BP                            Cognition Arousal/Alertness: Awake/alert Behavior During Therapy: WFL for tasks  assessed/performed Overall Cognitive Status: Within Functional Limits for tasks assessed                                        Exercises General Exercises - Lower Extremity Heel Slides: AROM;Strengthening;Both;10 reps;Supine (graded resistance bil LE in gross extension)    General Comments General comments (skin integrity, edema, etc.): Pt found to be orthostatic after visually seeing pt "wilt" in standing on initially getting OOB.  Pt placed supine in bed to help him recover then Supine and sitting BP's taken-- 140/55 (76) and 85/43 (53), respectively.  Felt no need to take standing pressures as pt very symptomatic      Pertinent Vitals/Pain Pain Assessment: No/denies pain    Home Living                      Prior Function            PT Goals (current goals can now be found in the care plan section) Acute Rehab PT Goals Patient Stated Goal: HOME PT Goal Formulation: With patient Time For Goal Achievement: 04/12/17 Potential to Achieve Goals: Fair Progress towards PT goals: Not progressing toward goals - comment (limited by orthostatic BP's)    Frequency    Min 3X/week      PT Plan Current plan  remains appropriate    Co-evaluation             End of Session   Activity Tolerance: Patient limited by fatigue;Other (comment) (orthostatic BP's) Patient left: in bed;with call bell/phone within reach;with bed alarm set Nurse Communication: Mobility status PT Visit Diagnosis: Unsteadiness on feet (R26.81);Other abnormalities of gait and mobility (R26.89)     Time: 1435-1456 PT Time Calculation (min) (ACUTE ONLY): 21 min  Charges:  $Therapeutic Activity: 8-22 mins                    G Codes:       2017-04-05  Donnella Sham, PT [phone]  (pager)   Tessie Fass Argus Caraher 2017-04-05, 6:40 PM

## 2017-04-04 NOTE — Progress Notes (Signed)
S: CO loose stools.  Has an appetite but CO about the food O:BP (!) 144/123 (BP Location: Left Arm)   Pulse 62   Temp 98.4 F (36.9 C) (Oral)   Resp 16   Ht '5\' 6"'  (1.676 m)   Wt 56.7 kg (125 lb)   SpO2 100%   BMI 20.18 kg/m   Intake/Output Summary (Last 24 hours) at 04/04/17 0845 Last data filed at 04/04/17 0646  Gross per 24 hour  Intake              320 ml  Output             2927 ml  Net            -2607 ml   Weight change: -2.449 kg (-5 lb 6.4 oz) PVV:ZSMOL and alert CVS:RRR no rub Resp: decreased BS bases Abd:+ BS NTND Ext: No edema NEURO:CNI Ox3 no asterixis GU: foley in place   . brinzolamide  1 drop Left Eye Q12H  . budesonide (PULMICORT) nebulizer solution  0.25 mg Nebulization BID  . carvedilol  25 mg Oral BID WC  . cefTRIAXone (ROCEPHIN)  IV  2 g Intravenous Q24H  . clopidogrel  75 mg Oral Daily  . ferumoxytol  510 mg Intravenous Weekly  . latanoprost  1 drop Both Eyes QHS  . potassium chloride  60 mEq Oral Once  . saccharomyces boulardii  250 mg Oral BID  . sodium bicarbonate  650 mg Oral BID  . tamsulosin  0.4 mg Oral QPC supper   No results found. BMET    Component Value Date/Time   NA 139 04/04/2017 0501   K 2.8 (L) 04/04/2017 0501   CL 101 04/04/2017 0501   CO2 23 04/04/2017 0501   GLUCOSE 105 (H) 04/04/2017 0501   GLUCOSE 84 10/07/2006 1055   BUN 124 (H) 04/04/2017 0501   CREATININE 5.59 (H) 04/04/2017 0501   CREATININE 1.91 (H) 06/27/2014 0926   CALCIUM 8.2 (L) 04/04/2017 0501   GFRNONAA 8 (L) 04/04/2017 0501   GFRNONAA 41 (L) 10/27/2013 0854   GFRAA 10 (L) 04/04/2017 0501   GFRAA 48 (L) 10/27/2013 0854   CBC    Component Value Date/Time   WBC 11.7 (H) 04/04/2017 0501   RBC 3.12 (L) 04/04/2017 0501   HGB 8.5 (L) 04/04/2017 0501   HGB 14.3 09/30/2007 1046   HCT 26.7 (L) 04/04/2017 0501   HCT 39.8 09/30/2007 1046   PLT 51 (L) 04/04/2017 0501   PLT 125 (L) 09/30/2007 1046   MCV 85.6 04/04/2017 0501   MCV 96.0 09/30/2007 1046   MCH 27.2 04/04/2017 0501   MCHC 31.8 04/04/2017 0501   RDW 20.1 (H) 04/04/2017 0501   RDW 13.1 09/30/2007 1046   LYMPHSABS 0.5 (L) 04/02/2017 0630   LYMPHSABS 1.7 09/30/2007 1046   MONOABS 0.5 04/02/2017 0630   MONOABS 0.7 09/30/2007 1046   EOSABS 0.1 04/02/2017 0630   EOSABS 0.1 09/30/2007 1046   BASOSABS 0.0 04/02/2017 0630   BASOSABS 0.0 09/30/2007 1046     Assessment:  1. Acute on CKD 3 in setting of Klebsiella Oxytoca bacteremia.  UO excellent, Scr stable.  Mild prominence of Lt collecting system seen on [place].  Will need FU 2. Klebsiella UTI and bacteremia 3. Met acidosis on oral bicarb 4. HTN 5. Chronic thrombocytopenia 6. Anemia and Fe def,  IV iron 7. Hypokalemia... received replacement today  Plan: 1. Will recheck renal US 2. Daily labs 3.  Cont to watch for  now as I anticipate renal fx to recover  Nicholas Porter

## 2017-04-04 NOTE — Progress Notes (Signed)
Patient has poorly fitted dentures and is unable to chew his food. MD notified regarding changing diet to soft. Orders placed. Will continue to monitor.

## 2017-04-05 LAB — RENAL FUNCTION PANEL
ALBUMIN: 2.1 g/dL — AB (ref 3.5–5.0)
Anion gap: 16 — ABNORMAL HIGH (ref 5–15)
BUN: 124 mg/dL — AB (ref 6–20)
CALCIUM: 8.2 mg/dL — AB (ref 8.9–10.3)
CHLORIDE: 101 mmol/L (ref 101–111)
CO2: 24 mmol/L (ref 22–32)
CREATININE: 4.77 mg/dL — AB (ref 0.61–1.24)
GFR, EST AFRICAN AMERICAN: 12 mL/min — AB (ref >60)
GFR, EST NON AFRICAN AMERICAN: 10 mL/min — AB (ref >60)
Glucose, Bld: 110 mg/dL — ABNORMAL HIGH (ref 65–99)
PHOSPHORUS: 6.2 mg/dL — AB (ref 2.5–4.6)
Potassium: 3.3 mmol/L — ABNORMAL LOW (ref 3.5–5.1)
SODIUM: 141 mmol/L (ref 135–145)

## 2017-04-05 LAB — CBC
HCT: 24.7 % — ABNORMAL LOW (ref 39.0–52.0)
Hemoglobin: 8 g/dL — ABNORMAL LOW (ref 13.0–17.0)
MCH: 28.2 pg (ref 26.0–34.0)
MCHC: 32.4 g/dL (ref 30.0–36.0)
MCV: 87 fL (ref 78.0–100.0)
PLATELETS: 61 10*3/uL — AB (ref 150–400)
RBC: 2.84 MIL/uL — ABNORMAL LOW (ref 4.22–5.81)
RDW: 20.3 % — AB (ref 11.5–15.5)
WBC: 10.9 10*3/uL — AB (ref 4.0–10.5)

## 2017-04-05 MED ORDER — SODIUM CHLORIDE 0.9 % IV SOLN
INTRAVENOUS | Status: DC
  Administered 2017-04-05: 10:00:00 via INTRAVENOUS

## 2017-04-05 MED ORDER — POTASSIUM CHLORIDE CRYS ER 20 MEQ PO TBCR
20.0000 meq | EXTENDED_RELEASE_TABLET | Freq: Two times a day (BID) | ORAL | Status: DC
  Administered 2017-04-05 (×2): 20 meq via ORAL
  Filled 2017-04-05 (×2): qty 1

## 2017-04-05 MED ORDER — POTASSIUM CHLORIDE 20 MEQ/15ML (10%) PO SOLN
20.0000 meq | Freq: Two times a day (BID) | ORAL | Status: DC
  Administered 2017-04-05: 20 meq via ORAL
  Filled 2017-04-05: qty 15

## 2017-04-05 NOTE — Progress Notes (Addendum)
Pt refused to take AM meds, states he "is going going to heaven".   Wife called into the room and talked to him. She was updated.   Pt is sitting up in bed.   Alerted MD Wynetta Emery.   Will continue to monitor.   1000 Pt was more awake at this time, was able to take AM meds.   Paulla Fore, RN

## 2017-04-05 NOTE — Progress Notes (Signed)
S: Diarrhea better.  Feels depressed.  Decreased appetite O:BP (!) 144/54 (BP Location: Right Arm)   Pulse 68   Temp 98 F (36.7 C) (Oral)   Resp 18   Ht _0  (1.676 m)   Wt 52.3 kg (115 lb 3.2 oz)   SpO2 98%   BMI 18.59 kg/m   Intake/Output Summary (Last 24 hours) at 04/05/17 0904 Last data filed at 04/05/17 0700  Gross per 24 hour  Intake              530 ml  Output             2325 ml  Net            -1795 ml   Weight change: -4.445 kg (-9 lb 12.8 oz) PVX:YIAXK and alert CVS:RRR no rub Resp: decreased BS bases Abd:+ BS NTND Ext: No edema NEURO:CNI Ox3 no asterixis GU: foley in place   . brinzolamide  1 drop Left Eye Q12H  . budesonide (PULMICORT) nebulizer solution  0.25 mg Nebulization BID  . carvedilol  25 mg Oral BID WC  . cefTRIAXone (ROCEPHIN)  IV  2 g Intravenous Q24H  . clopidogrel  75 mg Oral Daily  . ferumoxytol  510 mg Intravenous Weekly  . latanoprost  1 drop Both Eyes QHS  . saccharomyces boulardii  250 mg Oral BID  . sodium bicarbonate  650 mg Oral BID  . tamsulosin  0.4 mg Oral QPC supper   US Renal  Result Date: 04/04/2017 CLINICAL DATA:  Recent left-sided caliectasis EXAM: RENAL / URINARY TRACT ULTRASOUND COMPLETE COMPARISON:  February 26, 2017 FINDINGS: Right Kidney: Length: 9.9 cm. Echogenicity within normal limits. There is renal cortical thinning. No mass, perinephric fluid, or hydronephrosis visualized. No sonographically demonstrable calculus or ureterectasis. Left Kidney: Length: 10.0 cm. Echogenicity within normal limits. There is renal cortical thinning. No mass, perinephric fluid, or hydronephrosis visualized. There has been interval resolution of caliectasis on the left. No sonographically demonstrable calculus or ureterectasis. Bladder: Appears normal for degree of bladder distention. A Foley catheter is in place, although there is still modest urine within the bladder. There are bilateral pleural effusions. IMPRESSION: Interval resolution of  caliectasis on the left. Currently no obstructing focus in either kidney. There is renal cortical thinning bilaterally, a finding that may be seen with either age or medical renal disease. Note that the renal echogenicity is normal bilaterally. No new lesion. Incidental note is made of bilateral pleural effusions. Electronically Signed   By: Lowella Grip III M.D.   On: 04/04/2017 10:44   BMET    Component Value Date/Time   NA 141 04/05/2017 0352   K 3.3 (L) 04/05/2017 0352   CL 101 04/05/2017 0352   CO2 24 04/05/2017 0352   GLUCOSE 110 (H) 04/05/2017 0352   GLUCOSE 84 10/07/2006 1055   BUN 124 (H) 04/05/2017 0352   CREATININE 4.77 (H) 04/05/2017 0352   CREATININE 1.91 (H) 06/27/2014 0926   CALCIUM 8.2 (L) 04/05/2017 0352   GFRNONAA 10 (L) 04/05/2017 0352   GFRNONAA 41 (L) 10/27/2013 0854   GFRAA 12 (L) 04/05/2017 0352   GFRAA 48 (L) 10/27/2013 0854   CBC    Component Value Date/Time   WBC 10.9 (H) 04/05/2017 0352   RBC 2.84 (L) 04/05/2017 0352   HGB 8.0 (L) 04/05/2017 0352   HGB 14.3 09/30/2007 1046   HCT 24.7 (L) 04/05/2017 0352   HCT 39.8 09/30/2007 1046   PLT 61 (L) 04/05/2017 5537  PLT 125 (L) 09/30/2007 1046   MCV 87.0 04/05/2017 0352   MCV 96.0 09/30/2007 1046   MCH 28.2 04/05/2017 0352   MCHC 32.4 04/05/2017 0352   RDW 20.3 (H) 04/05/2017 0352   RDW 13.1 09/30/2007 1046   LYMPHSABS 0.5 (L) 04/02/2017 0630   LYMPHSABS 1.7 09/30/2007 1046   MONOABS 0.5 04/02/2017 0630   MONOABS 0.7 09/30/2007 1046   EOSABS 0.1 04/02/2017 0630   EOSABS 0.1 09/30/2007 1046   BASOSABS 0.0 04/02/2017 0630   BASOSABS 0.0 09/30/2007 1046     Assessment:  1. Acute on CKD 3 in setting of Klebsiella Oxytoca bacteremia.  UO excellent, Scr improved.  Mild prominence of Lt collecting system seen on initial [place] but on FU it has resolved 2. Klebsiella UTI and bacteremia 3. Met acidosis on oral bicarb 4. HTN 5. Chronic thrombocytopenia 6. Anemia and Fe def,  IV iron 7.  Hypokalemia... received replacement today  Plan: 1. Daily labs 2. Will give some IV fluids today. 3. Replace K  Hilario Robarts T

## 2017-04-05 NOTE — Progress Notes (Signed)
PROGRESS NOTE  Nicholas Porter UKG:[id number] DOB: [birth year] DOA: 03/28/2017 PCP: Cathlean Cower, MD  Brief History:  83 y.o.malewith medical history significant of CKD, HTN, CAD comes in with fever, confusion and near syncope event. Found to have sepsis due to UTI most likely.  Still with elevated lactic acid and experienced acute resp distress. BP difficult to control on current therapy.  Pt was transferred to [facility] 4/11 for higher level nephrology services if needed.   Assessment/Plan: Sepsis  -due to klebsiella Oxytoca Urinary tract infection and bacteremia -continue ceftriaxone 2 grams daily--D#1 of 14 was 03/31/17 -sepsis physiology resolving/improved. -will continue supportive care  Klebsiella bacteremia and UTI -continue ceftriaxone 2 grams daily--D#1 of 14 was 03/31/17  Acute kidney injury on chronic kidney disease stage 3 -Baseline creatinine of 1.4-1.6 -Renal ultrasound-- Mild prominence of the central left renal collecting system not seen previously. No gross hydronephrosis or caliectasis. -continue holding nephrotoxic agents -appreciate nephrology follow up -Nephrology team following closely -Cr slowly improving, now 4.77, gentle hydration per nephrology team  Volume overload  -secondary to acute on chronic renal failure, treated with lasix, now much improved -03/29/17 CXR--pulmonary vascular congestion -stable on 1 L-->96%  Essential hypertension -HoldingARB due to acute kidney injury -Continue PRN labetalol -continue coreg 25 mg/BID -overall better  CAD/elevated troponin  -Currently stable, no complaints of CP -troponin most likely elevated in presence of demand ischemia and acute renal failure.  -Continue plavix and B-blocker  -recent outpatient stress test and low risk for ischemia -03/30/17-- echo with global hypokinesis (no focal wall abnormalities), EF 45-50%  Chronic anemia of chronic renal failure -no signs of acute bleeding -baseline  Hgb~8-9 - follow Hgb trend -IV iron per renal  Diarrhea -C. Diff neg -continue supportive care -started on florastor -will use PRN lomotil as well   Metabolic/Lactic acidosis -due to infection and acute on chronic renal failure -continue po bicarb, likely can stop soon -given resp distress and edema seen on CXR IVFs stopped on 4/9.  Suspected BPH -patient decrease urine stream and also with nocturia (prior to admission) -continue flomax -had already follow up up arranged with urology service after discharge -renal US--Mild prominence of the central left renal collecting system not seen previously. No gross hydronephrosis or caliectasis.  Thrombocytopenia -likely due to sepsis -trending up slowly   Disposition Plan:  TBD Family Communication:   Family at bedside  Consultants:  nephrology  Code Status:  DNR  DVT Prophylaxis:  SCDs  Subjective: Patient refused to take morning meds.    Objective: Vitals:   04/05/17 0634 04/05/17 0827 04/05/17 0830 04/05/17 0921  BP: (!) 153/52 (!) 146/53 (!) 144/54   Pulse: 65 64 68   Resp: 17  18   Temp: 98.3 F (36.8 C)  98 F (36.7 C)   TempSrc: Oral  Oral   SpO2: 97%  98% 96%  Weight:      Height:        Intake/Output Summary (Last 24 hours) at 04/05/17 1030 Last data filed at 04/05/17 0700  Gross per 24 hour  Intake              530 ml  Output             2325 ml  Net            -1795 ml   Weight change: -4.445 kg (-9 lb 12.8 oz) ROS: as noted in history otherwise all reviewed and  reported negative  Exam:   General:  Pt is alert, follows commands appropriately, not in acute distress  HEENT: No icterus, No thrush, No neck mass, [place]/AT  Cardiovascular: nl S1/S2, no rubs, no gallops  Respiratory: bibasilar crackles, no wheezing, no crackles, no rhonchi  Abdomen: Soft/+BS, non tender, non distended, no guarding  Extremities: No edema, No lymphangitis, No petechiae  Data Reviewed: I have personally  reviewed following labs and imaging studies Basic Metabolic Panel:  Recent Labs Lab 04/01/17 0623 04/02/17 0630 04/03/17 0422 04/04/17 0501 04/04/17 0834 04/05/17 0352  NA 142 143 139 139  --  141  K 3.6 3.7 3.1* 2.8*  --  3.3*  CL 113* 111 104 101  --  101  CO2 18* 20* 21* 23  --  24  GLUCOSE 112* 100* 109* 105*  --  110*  BUN 99* 96* 117* 124*  --  124*  CREATININE 5.03* 5.58* 5.55* 5.59*  --  4.77*  CALCIUM 7.9* 8.4* 8.2* 8.2*  --  8.2*  MG  --   --   --   --  1.9  --   PHOS  --  5.8* 6.2* 6.5*  --  6.2*   Liver Function Tests:  Recent Labs Lab 04/02/17 0630 04/03/17 0422 04/04/17 0501 04/05/17 0352  ALBUMIN 2.4* 2.0* 2.1* 2.1*   No results for input(s): LIPASE, AMYLASE in the last 168 hours. No results for input(s): AMMONIA in the last 168 hours. Coagulation Profile: No results for input(s): INR, PROTIME in the last 168 hours. CBC:  Recent Labs Lab 04/02/17 0630 04/04/17 0501 04/05/17 0352  WBC 5.8 11.7* 10.9*  NEUTROABS 4.7  --   --   HGB 7.8* 8.5* 8.0*  HCT 24.2* 26.7* 24.7*  MCV 86.4 85.6 87.0  PLT 44* 51* 61*   Cardiac Enzymes:  Recent Labs Lab 03/29/17 1913 03/30/17 0012 03/30/17 0629  TROPONINI 0.82* 0.93* 0.84*   BNP: Invalid input(s): POCBNP CBG: No results for input(s): GLUCAP in the last 168 hours. HbA1C: No results for input(s): HGBA1C in the last 72 hours. Urine analysis:    Component Value Date/Time   COLORURINE YELLOW 04/01/2017 1702   APPEARANCEUR HAZY (A) 04/01/2017 1702   LABSPEC 1.013 04/01/2017 1702   PHURINE 5.0 04/01/2017 1702   GLUCOSEU NEGATIVE 04/01/2017 1702   GLUCOSEU NEGATIVE 03/11/2017 1605   HGBUR SMALL (A) 04/01/2017 1702   HGBUR negative 12/06/2009 0836   BILIRUBINUR NEGATIVE 04/01/2017 1702   BILIRUBINUR negative 02/13/2017 1025   KETONESUR NEGATIVE 04/01/2017 1702   PROTEINUR NEGATIVE 04/01/2017 1702   UROBILINOGEN 0.2 03/11/2017 1605   NITRITE NEGATIVE 04/01/2017 1702   LEUKOCYTESUR NEGATIVE  04/01/2017 1702    Recent Results (from the past 240 hour(s))  Culture, blood (Routine x 2)     Status: Abnormal   Collection Time: 03/28/17  9:40 PM  Result Value Ref Range Status   Specimen Description BLOOD LAC  Final   Special Requests BOTTLES DRAWN AEROBIC AND ANAEROBIC BCAV  Final   Culture  Setup Time   Final    GRAM NEGATIVE RODS IN BOTH AEROBIC AND ANAEROBIC BOTTLES CRITICAL RESULT CALLED TO, READ BACK BY AND VERIFIED WITH: D. WOFFARD, PHARM AT [facility], 03/29/17 AT 1624 BY J FUDESCO Performed at [facility], [place] [address] [place], [place] [address]    Culture KLEBSIELLA OXYTOCA (A)  Final   Report Status 03/31/2017 FINAL  Final   Organism ID, Bacteria KLEBSIELLA OXYTOCA  Final      Susceptibility  Klebsiella oxytoca - MIC*    AMPICILLIN >=32 RESISTANT Resistant     CEFAZOLIN 8 SENSITIVE Sensitive     CEFEPIME <=1 SENSITIVE Sensitive     CEFTAZIDIME <=1 SENSITIVE Sensitive     CEFTRIAXONE <=1 SENSITIVE Sensitive     CIPROFLOXACIN <=0.25 SENSITIVE Sensitive     GENTAMICIN <=1 SENSITIVE Sensitive     IMIPENEM <=0.25 SENSITIVE Sensitive     TRIMETH/SULFA <=20 SENSITIVE Sensitive     AMPICILLIN/SULBACTAM 16 INTERMEDIATE Intermediate     PIP/TAZO <=4 SENSITIVE Sensitive     Extended ESBL NEGATIVE Sensitive     * KLEBSIELLA OXYTOCA  Blood Culture ID Panel (Reflexed)     Status: Abnormal   Collection Time: 03/28/17  9:40 PM  Result Value Ref Range Status   Enterococcus species NOT DETECTED NOT DETECTED Final   Listeria monocytogenes NOT DETECTED NOT DETECTED Final   Staphylococcus species NOT DETECTED NOT DETECTED Final   Staphylococcus aureus NOT DETECTED NOT DETECTED Final   Streptococcus species NOT DETECTED NOT DETECTED Final   Streptococcus agalactiae NOT DETECTED NOT DETECTED Final   Streptococcus pneumoniae NOT DETECTED NOT DETECTED Final   Streptococcus pyogenes NOT DETECTED NOT DETECTED Final   Acinetobacter baumannii NOT DETECTED NOT DETECTED Final    Enterobacteriaceae species DETECTED (A) NOT DETECTED Final    Comment: Enterobacteriaceae represent a large family of gram-negative bacteria, not a single organism. CRITICAL RESULT CALLED TO, READ BACK BY AND VERIFIED WITH: D. WOFFARD, PH[facility] AT [facility], 03/29/17 AT 1623 BY J FUDESCO    Enterobacter cloacae complex NOT DETECTED NOT DETECTED Final   Escherichia coli NOT DETECTED NOT DETECTED Final   Klebsiella oxytoca DETECTED (A) NOT DETECTED Final    Comment: CRITICAL RESULT CALLED TO, READ BACK BY AND VERIFIED WITH: D. WOFFARD, PHARM AT [facility], 03/29/17 AT 1623 BY J FUDESCO    Klebsiella pneumoniae NOT DETECTED NOT DETECTED Final   Proteus species NOT DETECTED NOT DETECTED Final   Serratia marcescens NOT DETECTED NOT DETECTED Final   Carbapenem resistance NOT DETECTED NOT DETECTED Final   Haemophilus influenzae NOT DETECTED NOT DETECTED Final   Neisseria meningitidis NOT DETECTED NOT DETECTED Final   Pseudomonas aeruginosa NOT DETECTED NOT DETECTED Final   Candida albicans NOT DETECTED NOT DETECTED Final   Candida glabrata NOT DETECTED NOT DETECTED Final   Candida krusei NOT DETECTED NOT DETECTED Final   Candida parapsilosis NOT DETECTED NOT DETECTED Final   Candida tropicalis NOT DETECTED NOT DETECTED Final    Comment: Performed at [facility], [address] [place], [place] [address]  Culture, blood (Routine x 2)     Status: Abnormal   Collection Time: 03/28/17  9:45 PM  Result Value Ref Range Status   Specimen Description BLOOD RIGHT ARM  Final   Special Requests IN PEDIATRIC BOTTLE BCAV  Final   Culture  Setup Time   Final    GRAM NEGATIVE RODS IN PEDIATRIC BOTTLE CRITICAL VALUE NOTED.  VALUE IS CONSISTENT WITH PREVIOUSLY REPORTED AND CALLED VALUE.    Culture (A)  Final    KLEBSIELLA OXYTOCA SUSCEPTIBILITIES PERFORMED ON PREVIOUS CULTURE WITHIN THE LAST 5 DAYS. Performed at [facility], [place] [address] [place], [place] [address]    Report Status 03/31/2017 FINAL  Final    Urine culture     Status: Abnormal   Collection Time: 03/28/17 11:57 PM  Result Value Ref Range Status   Specimen Description URINE, CLEAN CATCH  Final   Special Requests NONE  Final  Culture >=100,000 COLONIES/mL KLEBSIELLA OXYTOCA (A)  Final   Report Status 03/31/2017 FINAL  Final   Organism ID, Bacteria KLEBSIELLA OXYTOCA (A)  Final      Susceptibility   Klebsiella oxytoca - MIC*    AMPICILLIN >=32 RESISTANT Resistant     CEFAZOLIN 8 SENSITIVE Sensitive     CEFTRIAXONE <=1 SENSITIVE Sensitive     CIPROFLOXACIN <=0.25 SENSITIVE Sensitive     GENTAMICIN <=1 SENSITIVE Sensitive     IMIPENEM <=0.25 SENSITIVE Sensitive     NITROFURANTOIN 32 SENSITIVE Sensitive     TRIMETH/SULFA <=20 SENSITIVE Sensitive     AMPICILLIN/SULBACTAM 16 INTERMEDIATE Intermediate     PIP/TAZO <=4 SENSITIVE Sensitive     Extended ESBL NEGATIVE Sensitive     * >=100,000 COLONIES/mL KLEBSIELLA OXYTOCA  MRSA PCR Screening     Status: None   Collection Time: 03/29/17  1:14 AM  Result Value Ref Range Status   MRSA by PCR NEGATIVE NEGATIVE Final    Comment:        The GeneXpert MRSA Assay (FDA approved for NASAL specimens only), is one component of a comprehensive MRSA colonization surveillance program. It is not intended to diagnose MRSA infection nor to guide or monitor treatment for MRSA infections.   C difficile quick scan w PCR reflex     Status: None   Collection Time: 03/29/17  3:08 PM  Result Value Ref Range Status   C Diff antigen NEGATIVE NEGATIVE Final   C Diff toxin NEGATIVE NEGATIVE Final   C Diff interpretation No C. difficile detected.  Final     Scheduled Meds: . brinzolamide  1 drop Left Eye Q12H  . budesonide (PULMICORT) nebulizer solution  0.25 mg Nebulization BID  . carvedilol  25 mg Oral BID WC  . cefTRIAXone (ROCEPHIN)  IV  2 g Intravenous Q24H  . clopidogrel  75 mg Oral Daily  . ferumoxytol  510 mg Intravenous Weekly  . latanoprost  1 drop Both Eyes QHS  . potassium  chloride  20 mEq Oral BID  . saccharomyces boulardii  250 mg Oral BID  . sodium bicarbonate  650 mg Oral BID  . tamsulosin  0.4 mg Oral QPC supper   Continuous Infusions: . sodium chloride 75 mL/hr at 04/05/17 0569    Procedures/Studies: US Renal  Result Date: 04/04/2017 CLINICAL DATA:  Recent left-sided caliectasis EXAM: RENAL / URINARY TRACT ULTRASOUND COMPLETE COMPARISON:  February 26, 2017 FINDINGS: Right Kidney: Length: 9.9 cm. Echogenicity within normal limits. There is renal cortical thinning. No mass, perinephric fluid, or hydronephrosis visualized. No sonographically demonstrable calculus or ureterectasis. Left Kidney: Length: 10.0 cm. Echogenicity within normal limits. There is renal cortical thinning. No mass, perinephric fluid, or hydronephrosis visualized. There has been interval resolution of caliectasis on the left. No sonographically demonstrable calculus or ureterectasis. Bladder: Appears normal for degree of bladder distention. A Foley catheter is in place, although there is still modest urine within the bladder. There are bilateral pleural effusions. IMPRESSION: Interval resolution of caliectasis on the left. Currently no obstructing focus in either kidney. There is renal cortical thinning bilaterally, a finding that may be seen with either age or medical renal disease. Note that the renal echogenicity is normal bilaterally. No new lesion. Incidental note is made of bilateral pleural effusions. Electronically Signed   By: Lowella Grip III M.D.   On: 04/04/2017 10:44   US Renal  Result Date: 03/29/2017 CLINICAL DATA:  Acute renal injury EXAM: RENAL / URINARY TRACT ULTRASOUND COMPLETE COMPARISON:  February 24, 2017 FINDINGS: Right Kidney: Length: 9 cm. Increased cortical echogenicity. No mass or hydronephrosis. Left Kidney: Length: 8.5 cm. Mild prominence of the central left renal collecting system as seen on image 26 without gross hydronephrosis. This was not visualized previously.  Bladder: The bladder is decompressed and poorly evaluated but grossly unremarkable. IMPRESSION: Mild prominence of the central left renal collecting system not seen previously. No gross hydronephrosis or caliectasis. Recommend clinical correlation. Electronically Signed   By: Dorise Bullion III M.D   On: 03/29/2017 09:43   Dg Chest Port 1 View  Result Date: 03/29/2017 CLINICAL DATA:  Shortness of breath. History carneae artery disease, inferior STEMI and mitral regurgitation. History of cardiac catheterization. Former smoker. EXAM: PORTABLE CHEST 1 VIEW COMPARISON:  Chest x-rays dated 03/28/2017, 02/23/2017 and 07/29/2015 FINDINGS: Heart size is upper normal. Atherosclerotic changes noted at the aortic arch. There is new central pulmonary vascular congestion and hazy opacities at each lung base which likely represents associated interstitial edema. No significant pleural effusion. No pneumothorax seen. IMPRESSION: 1. Central pulmonary vascular congestion, and probable bibasilar interstitial edema, suggesting mild volume overload/CHF. 2.  Aortic atherosclerosis. Electronically Signed   By: Franki Cabot M.D.   On: 03/29/2017 18:16   Dg Chest Port 1 View  Result Date: 03/28/2017 CLINICAL DATA:  Weakness and confusion tonight.  Near syncope. EXAM: PORTABLE CHEST 1 VIEW COMPARISON:  02/23/2017 FINDINGS: AP portable views of the chest demonstrate no focal airspace consolidation or alveolar edema. The lungs are grossly clear. There is no large effusion or pneumothorax. Cardiac and mediastinal contours appear unremarkable. IMPRESSION: No active disease. Electronically Signed   By: Andreas Newport M.D.   On: 03/28/2017 22:09    Irwin Brakeman, MD  [facility] Pager [phone]  If 7PM-7AM, please contact night-coverage [email or web address] Password [email or web address] 04/05/2017, 10:30 AM   LOS: 8 days

## 2017-04-06 LAB — RENAL FUNCTION PANEL
Albumin: 2.1 g/dL — ABNORMAL LOW (ref 3.5–5.0)
Anion gap: 12 (ref 5–15)
BUN: 115 mg/dL — ABNORMAL HIGH (ref 6–20)
CALCIUM: 8.3 mg/dL — AB (ref 8.9–10.3)
CO2: 23 mmol/L (ref 22–32)
CREATININE: 3.82 mg/dL — AB (ref 0.61–1.24)
Chloride: 109 mmol/L (ref 101–111)
GFR calc Af Amer: 15 mL/min — ABNORMAL LOW (ref >60)
GFR calc non Af Amer: 13 mL/min — ABNORMAL LOW (ref >60)
GLUCOSE: 95 mg/dL (ref 65–99)
Phosphorus: 5.2 mg/dL — ABNORMAL HIGH (ref 2.5–4.6)
Potassium: 4.2 mmol/L (ref 3.5–5.1)
SODIUM: 144 mmol/L (ref 135–145)

## 2017-04-06 MED ORDER — ORAL CARE MOUTH RINSE
15.0000 mL | Freq: Two times a day (BID) | OROMUCOSAL | Status: DC
  Administered 2017-04-08: 15 mL via OROMUCOSAL

## 2017-04-06 NOTE — Progress Notes (Signed)
S: Has been up in chair.  Eating well O:BP (!) 144/54   Pulse 68   Temp 99.1 F (37.3 C) (Oral)   Resp 16   Ht 5' 6" (1.676 m)   Wt 41.9 kg (92 lb 4.8 oz)   SpO2 99%   BMI 14.90 kg/m   Intake/Output Summary (Last 24 hours) at 04/06/17 0858 Last data filed at 04/06/17 0700  Gross per 24 hour  Intake          1951.25 ml  Output             1551 ml  Net           400.25 ml   Weight change: -10.387 kg (-22 lb 14.4 oz) Gen:Awake and alert CVS:RRR no rub Resp: decreased BS bases Abd:+ BS NTND Ext: No edema NEURO:CNI Ox3 no asterixis GU: foley in place   . brinzolamide  1 drop Left Eye Q12H  . budesonide (PULMICORT) nebulizer solution  0.25 mg Nebulization BID  . carvedilol  25 mg Oral BID WC  . cefTRIAXone (ROCEPHIN)  IV  2 g Intravenous Q24H  . clopidogrel  75 mg Oral Daily  . ferumoxytol  510 mg Intravenous Weekly  . latanoprost  1 drop Both Eyes QHS  . mouth rinse  15 mL Mouth Rinse BID  . potassium chloride  20 mEq Oral BID  . saccharomyces boulardii  250 mg Oral BID  . sodium bicarbonate  650 mg Oral BID  . tamsulosin  0.4 mg Oral QPC supper   Us Renal  Result Date: 04/04/2017 CLINICAL DATA:  Recent left-sided caliectasis EXAM: RENAL / URINARY TRACT ULTRASOUND COMPLETE COMPARISON:  February 26, 2017 FINDINGS: Right Kidney: Length: 9.9 cm. Echogenicity within normal limits. There is renal cortical thinning. No mass, perinephric fluid, or hydronephrosis visualized. No sonographically demonstrable calculus or ureterectasis. Left Kidney: Length: 10.0 cm. Echogenicity within normal limits. There is renal cortical thinning. No mass, perinephric fluid, or hydronephrosis visualized. There has been interval resolution of caliectasis on the left. No sonographically demonstrable calculus or ureterectasis. Bladder: Appears normal for degree of bladder distention. A Foley catheter is in place, although there is still modest urine within the bladder. There are bilateral pleural effusions.  IMPRESSION: Interval resolution of caliectasis on the left. Currently no obstructing focus in either kidney. There is renal cortical thinning bilaterally, a finding that may be seen with either age or medical renal disease. Note that the renal echogenicity is normal bilaterally. No new lesion. Incidental note is made of bilateral pleural effusions. Electronically Signed   By: William  Woodruff III M.D.   On: 04/04/2017 10:44   BMET    Component Value Date/Time   NA 144 04/06/2017 0506   K 4.2 04/06/2017 0506   CL 109 04/06/2017 0506   CO2 23 04/06/2017 0506   GLUCOSE 95 04/06/2017 0506   GLUCOSE 84 10/07/2006 1055   BUN 115 (H) 04/06/2017 0506   CREATININE 3.82 (H) 04/06/2017 0506   CREATININE 1.91 (H) 06/27/2014 0926   CALCIUM 8.3 (L) 04/06/2017 0506   GFRNONAA 13 (L) 04/06/2017 0506   GFRNONAA 41 (L) 10/27/2013 0854   GFRAA 15 (L) 04/06/2017 0506   GFRAA 48 (L) 10/27/2013 0854   CBC    Component Value Date/Time   WBC 10.9 (H) 04/05/2017 0352   RBC 2.84 (L) 04/05/2017 0352   HGB 8.0 (L) 04/05/2017 0352   HGB 14.3 09/30/2007 1046   HCT 24.7 (L) 04/05/2017 0352   HCT 39.8 09/30/2007   1046   PLT 61 (L) 04/05/2017 0352   PLT 125 (L) 09/30/2007 1046   MCV 87.0 04/05/2017 0352   MCV 96.0 09/30/2007 1046   MCH 28.2 04/05/2017 0352   MCHC 32.4 04/05/2017 0352   RDW 20.3 (H) 04/05/2017 0352   RDW 13.1 09/30/2007 1046   LYMPHSABS 0.5 (L) 04/02/2017 0630   LYMPHSABS 1.7 09/30/2007 1046   MONOABS 0.5 04/02/2017 0630   MONOABS 0.7 09/30/2007 1046   EOSABS 0.1 04/02/2017 0630   EOSABS 0.1 09/30/2007 1046   BASOSABS 0.0 04/02/2017 0630   BASOSABS 0.0 09/30/2007 1046     Assessment:  1. Acute on CKD 3 in setting of Klebsiella Oxytoca bacteremia.  Scr improving.  Mild prominence of Lt collecting system seen on initial [place] but on FU it has resolved 2. Klebsiella UTI and bacteremia 3. Met acidosis on oral bicarb 4. HTN 5. Chronic thrombocytopenia 6. Anemia and Fe def,  IV iron 7.  Hypokalemia, improved off lasix  Plan: 1. DC foley 2. DC IV fluids after current bag infused 3. Daily labs 4. Dc KCL  Natallia Stellmach T

## 2017-04-06 NOTE — Progress Notes (Signed)
PROGRESS NOTE  Nicholas Porter KYH:[id number] DOB: September 02, 1930 DOA: 03/28/2017 PCP: Cathlean Cower, MD  Brief History:  83 y.o.malewith medical history significant of CKD, HTN, CAD comes in with fever, confusion and near syncope event. Found to have sepsis due to UTI most likely.  Still with elevated lactic acid and experienced acute resp distress. BP difficult to control on current therapy.  Pt was transferred to [facility] 4/11 for higher level nephrology services if needed.   Assessment/Plan: Sepsis  -due to klebsiella Oxytoca Urinary tract infection and bacteremia -continue ceftriaxone 2 grams daily--D#1 of 14 was 03/31/17 -sepsis physiology resolving/improved. -will continue supportive care  Klebsiella bacteremia and UTI -continue ceftriaxone 2 grams daily--D#1 of 14 was 03/31/17  Acute kidney injury on chronic kidney disease stage 3 -Baseline creatinine of 1.4-1.6 -Renal ultrasound-- Mild prominence of the central left renal collecting system not seen previously. No gross hydronephrosis or caliectasis. -continue holding nephrotoxic agents -appreciate nephrology follow up -Nephrology team following closely -Cr slowly improving, now 3.38, UOP picking up  Volume overload  -secondary to acute on chronic renal failure, treated with lasix, now much improved -03/29/17 CXR--pulmonary vascular congestion -stable now on RA 99%  Essential hypertension -HoldingARB due to acute kidney injury -Continue PRN labetalol -continue coreg 25 mg/BID -overall better  CAD/elevated troponin  -Currently stable, no complaints of CP -troponin most likely elevated in presence of demand ischemia and acute renal failure.  -Continue plavix and B-blocker  -recent outpatient stress test and low risk for ischemia -03/30/17-- echo with global hypokinesis (no focal wall abnormalities), EF 45-50%  Chronic anemia of chronic renal failure -no signs of acute bleeding -baseline Hgb~8-9 - follow Hgb  trend -IV iron per renal  Diarrhea -C. Diff neg -continue supportive care -started on florastor -will use PRN lomotil as well   Metabolic/Lactic acidosis -due to infection and acute on chronic renal failure -continue po bicarb, likely can stop soon -given resp distress and edema seen on CXR IVFs stopped on 4/9.  Suspected BPH -patient decrease urine stream and also with nocturia (prior to admission) -continue flomax -had already follow up up arranged with urology service after discharge -renal US--Mild prominence of the central left renal collecting system not seen previously. No gross hydronephrosis or caliectasis. - foley discontinued 4/15 per nephrology team.  Thrombocytopenia -likely due to sepsis -trending up slowly  Disposition Plan:  TBD Family Communication:   Family at bedside  Consultants:  nephrology  Code Status:  DNR  DVT Prophylaxis:  SCDs  Subjective: Patient reports some loose stools but not diarrhea   Objective: Vitals:   04/05/17 2026 04/05/17 2138 04/06/17 0550 04/06/17 0838  BP:  (!) 168/49 (!) 165/59 (!) 144/54  Pulse:  63 67 68  Resp:  16 16   Temp:  97.5 F (36.4 C) 99.1 F (37.3 C)   TempSrc:  Axillary Oral   SpO2: 96% 100% 99%   Weight:  41.9 kg (92 lb 4.8 oz)    Height:        Intake/Output Summary (Last 24 hours) at 04/06/17 1058 Last data filed at 04/06/17 0900  Gross per 24 hour  Intake          2191.25 ml  Output             2252 ml  Net           -60.75 ml   Weight change: -10.387 kg (-22 lb 14.4 oz) ROS: as noted  in history otherwise all reviewed and reported negative  Exam:   General:  Pt is alert, follows commands appropriately, not in acute distress  HEENT: No icterus, No thrush, No neck mass, [place]/AT  Cardiovascular: nl S1/S2, no rubs, no gallops  Respiratory: bibasilar crackles, no wheezing, no crackles, no rhonchi  Abdomen: Soft/+BS, non tender, non distended, no guarding  Extremities: No edema, No  lymphangitis, No petechiae  Data Reviewed: I have personally reviewed following labs and imaging studies Basic Metabolic Panel:  Recent Labs Lab 04/02/17 0630 04/03/17 0422 04/04/17 0501 04/04/17 0834 04/05/17 0352 04/06/17 0506  NA 143 139 139  --  141 144  K 3.7 3.1* 2.8*  --  3.3* 4.2  CL 111 104 101  --  101 109  CO2 20* 21* 23  --  24 23  GLUCOSE 100* 109* 105*  --  110* 95  BUN 96* 117* 124*  --  124* 115*  CREATININE 5.58* 5.55* 5.59*  --  4.77* 3.82*  CALCIUM 8.4* 8.2* 8.2*  --  8.2* 8.3*  MG  --   --   --  1.9  --   --   PHOS 5.8* 6.2* 6.5*  --  6.2* 5.2*   Liver Function Tests:  Recent Labs Lab 04/02/17 0630 04/03/17 0422 04/04/17 0501 04/05/17 0352 04/06/17 0506  ALBUMIN 2.4* 2.0* 2.1* 2.1* 2.1*   No results for input(s): LIPASE, AMYLASE in the last 168 hours. No results for input(s): AMMONIA in the last 168 hours. Coagulation Profile: No results for input(s): INR, PROTIME in the last 168 hours. CBC:  Recent Labs Lab 04/02/17 0630 04/04/17 0501 04/05/17 0352  WBC 5.8 11.7* 10.9*  NEUTROABS 4.7  --   --   HGB 7.8* 8.5* 8.0*  HCT 24.2* 26.7* 24.7*  MCV 86.4 85.6 87.0  PLT 44* 51* 61*   Cardiac Enzymes: No results for input(s): CKTOTAL, CKMB, CKMBINDEX, TROPONINI in the last 168 hours. BNP: Invalid input(s): POCBNP CBG: No results for input(s): GLUCAP in the last 168 hours. HbA1C: No results for input(s): HGBA1C in the last 72 hours. Urine analysis:    Component Value Date/Time   COLORURINE YELLOW 04/01/2017 1702   APPEARANCEUR HAZY (A) 04/01/2017 1702   LABSPEC 1.013 04/01/2017 1702   PHURINE 5.0 04/01/2017 1702   GLUCOSEU NEGATIVE 04/01/2017 1702   GLUCOSEU NEGATIVE 03/11/2017 1605   HGBUR SMALL (A) 04/01/2017 1702   HGBUR negative 12/06/2009 0836   BILIRUBINUR NEGATIVE 04/01/2017 1702   BILIRUBINUR negative 02/13/2017 1025   KETONESUR NEGATIVE 04/01/2017 1702   PROTEINUR NEGATIVE 04/01/2017 1702   UROBILINOGEN 0.2 03/11/2017 1605    NITRITE NEGATIVE 04/01/2017 1702   LEUKOCYTESUR NEGATIVE 04/01/2017 1702    Recent Results (from the past 240 hour(s))  Culture, blood (Routine x 2)     Status: Abnormal   Collection Time: 03/28/17  9:40 PM  Result Value Ref Range Status   Specimen Description BLOOD LAC  Final   Special Requests BOTTLES DRAWN AEROBIC AND ANAEROBIC BCAV  Final   Culture  Setup Time   Final    GRAM NEGATIVE RODS IN BOTH AEROBIC AND ANAEROBIC BOTTLES CRITICAL RESULT CALLED TO, READ BACK BY AND VERIFIED WITH: D. WOFFARD, [facility], 03/29/17 AT 1624 BY J FUDESCO Performed at [facility], [place] [address] [place],  [address]    Culture KLEBSIELLA OXYTOCA (A)  Final   Report Status 03/31/2017 FINAL  Final   Organism ID, Bacteria KLEBSIELLA OXYTOCA  Final  Susceptibility   Klebsiella oxytoca - MIC*    AMPICILLIN >=32 RESISTANT Resistant     CEFAZOLIN 8 SENSITIVE Sensitive     CEFEPIME <=1 SENSITIVE Sensitive     CEFTAZIDIME <=1 SENSITIVE Sensitive     CEFTRIAXONE <=1 SENSITIVE Sensitive     CIPROFLOXACIN <=0.25 SENSITIVE Sensitive     GENTAMICIN <=1 SENSITIVE Sensitive     IMIPENEM <=0.25 SENSITIVE Sensitive     TRIMETH/SULFA <=20 SENSITIVE Sensitive     AMPICILLIN/SULBACTAM 16 INTERMEDIATE Intermediate     PIP/TAZO <=4 SENSITIVE Sensitive     Extended ESBL NEGATIVE Sensitive     * KLEBSIELLA OXYTOCA  Blood Culture ID Panel (Reflexed)     Status: Abnormal   Collection Time: 03/28/17  9:40 PM  Result Value Ref Range Status   Enterococcus species NOT DETECTED NOT DETECTED Final   Listeria monocytogenes NOT DETECTED NOT DETECTED Final   Staphylococcus species NOT DETECTED NOT DETECTED Final   Staphylococcus aureus NOT DETECTED NOT DETECTED Final   Streptococcus species NOT DETECTED NOT DETECTED Final   Streptococcus agalactiae NOT DETECTED NOT DETECTED Final   Streptococcus pneumoniae NOT DETECTED NOT DETECTED Final   Streptococcus pyogenes NOT DETECTED NOT DETECTED Final    Acinetobacter baumannii NOT DETECTED NOT DETECTED Final   Enterobacteriaceae species DETECTED (A) NOT DETECTED Final    Comment: Enterobacteriaceae represent a large family of gram-negative bacteria, not a single organism. CRITICAL RESULT CALLED TO, READ BACK BY AND VERIFIED WITH: D. WOFFARD, [facility], 03/29/17 AT 1623 BY J FUDESCO    Enterobacter cloacae complex NOT DETECTED NOT DETECTED Final   Escherichia coli NOT DETECTED NOT DETECTED Final   Klebsiella oxytoca DETECTED (A) NOT DETECTED Final    Comment: CRITICAL RESULT CALLED TO, READ BACK BY AND VERIFIED WITH: D. WOFFARD, [facility], 03/29/17 AT 1623 BY J FUDESCO    Klebsiella pneumoniae NOT DETECTED NOT DETECTED Final   Proteus species NOT DETECTED NOT DETECTED Final   Serratia marcescens NOT DETECTED NOT DETECTED Final   Carbapenem resistance NOT DETECTED NOT DETECTED Final   Haemophilus influenzae NOT DETECTED NOT DETECTED Final   Neisseria meningitidis NOT DETECTED NOT DETECTED Final   Pseudomonas aeruginosa NOT DETECTED NOT DETECTED Final   Candida albicans NOT DETECTED NOT DETECTED Final   Candida glabrata NOT DETECTED NOT DETECTED Final   Candida krusei NOT DETECTED NOT DETECTED Final   Candida parapsilosis NOT DETECTED NOT DETECTED Final   Candida tropicalis NOT DETECTED NOT DETECTED Final    Comment: Performed at [facility], [address] [place], [place] [address]  Culture, blood (Routine x 2)     Status: Abnormal   Collection Time: 03/28/17  9:45 PM  Result Value Ref Range Status   Specimen Description BLOOD RIGHT ARM  Final   Special Requests IN PEDIATRIC BOTTLE BCAV  Final   Culture  Setup Time   Final    GRAM NEGATIVE RODS IN PEDIATRIC BOTTLE CRITICAL VALUE NOTED.  VALUE IS CONSISTENT WITH PREVIOUSLY REPORTED AND CALLED VALUE.    Culture (A)  Final    KLEBSIELLA OXYTOCA SUSCEPTIBILITIES PERFORMED ON PREVIOUS CULTURE WITHIN THE LAST 5 DAYS. Performed at [facility], [place] [address],  [place], [place] [address]    Report Status 03/31/2017 FINAL  Final  Urine culture     Status: Abnormal   Collection Time: 03/28/17 11:57 PM  Result Value Ref Range Status   Specimen Description URINE, CLEAN CATCH  Final   Special Requests NONE  Final   Culture >=100,000 COLONIES/mL KLEBSIELLA OXYTOCA (A)  Final   Report Status 03/31/2017 FINAL  Final   Organism ID, Bacteria KLEBSIELLA OXYTOCA (A)  Final      Susceptibility   Klebsiella oxytoca - MIC*    AMPICILLIN >=32 RESISTANT Resistant     CEFAZOLIN 8 SENSITIVE Sensitive     CEFTRIAXONE <=1 SENSITIVE Sensitive     CIPROFLOXACIN <=0.25 SENSITIVE Sensitive     GENTAMICIN <=1 SENSITIVE Sensitive     IMIPENEM <=0.25 SENSITIVE Sensitive     NITROFURANTOIN 32 SENSITIVE Sensitive     TRIMETH/SULFA <=20 SENSITIVE Sensitive     AMPICILLIN/SULBACTAM 16 INTERMEDIATE Intermediate     PIP/TAZO <=4 SENSITIVE Sensitive     Extended ESBL NEGATIVE Sensitive     * >=100,000 COLONIES/mL KLEBSIELLA OXYTOCA  MRSA PCR Screening     Status: None   Collection Time: 03/29/17  1:14 AM  Result Value Ref Range Status   MRSA by PCR NEGATIVE NEGATIVE Final    Comment:        The GeneXpert MRSA Assay (FDA approved for NASAL specimens only), is one component of a comprehensive MRSA colonization surveillance program. It is not intended to diagnose MRSA infection nor to guide or monitor treatment for MRSA infections.   C difficile quick scan w PCR reflex     Status: None   Collection Time: 03/29/17  3:08 PM  Result Value Ref Range Status   C Diff antigen NEGATIVE NEGATIVE Final   C Diff toxin NEGATIVE NEGATIVE Final   C Diff interpretation No C. difficile detected.  Final     Scheduled Meds: . brinzolamide  1 drop Left Eye Q12H  . budesonide (PULMICORT) nebulizer solution  0.25 mg Nebulization BID  . carvedilol  25 mg Oral BID WC  . cefTRIAXone (ROCEPHIN)  IV  2 g Intravenous Q24H  . clopidogrel  75 mg Oral Daily  . ferumoxytol  510 mg Intravenous  Weekly  . latanoprost  1 drop Both Eyes QHS  . mouth rinse  15 mL Mouth Rinse BID  . saccharomyces boulardii  250 mg Oral BID  . sodium bicarbonate  650 mg Oral BID  . tamsulosin  0.4 mg Oral QPC supper   Continuous Infusions: . sodium chloride 75 mL/hr at 04/05/17 8938    Procedures/Studies: US Renal  Result Date: 04/04/2017 CLINICAL DATA:  Recent left-sided caliectasis EXAM: RENAL / URINARY TRACT ULTRASOUND COMPLETE COMPARISON:  February 26, 2017 FINDINGS: Right Kidney: Length: 9.9 cm. Echogenicity within normal limits. There is renal cortical thinning. No mass, perinephric fluid, or hydronephrosis visualized. No sonographically demonstrable calculus or ureterectasis. Left Kidney: Length: 10.0 cm. Echogenicity within normal limits. There is renal cortical thinning. No mass, perinephric fluid, or hydronephrosis visualized. There has been interval resolution of caliectasis on the left. No sonographically demonstrable calculus or ureterectasis. Bladder: Appears normal for degree of bladder distention. A Foley catheter is in place, although there is still modest urine within the bladder. There are bilateral pleural effusions. IMPRESSION: Interval resolution of caliectasis on the left. Currently no obstructing focus in either kidney. There is renal cortical thinning bilaterally, a finding that may be seen with either age or medical renal disease. Note that the renal echogenicity is normal bilaterally. No new lesion. Incidental note is made of bilateral pleural effusions. Electronically Signed   By: Lowella Grip III M.D.   On: 04/04/2017 10:44   US Renal  Result Date: 03/29/2017 CLINICAL DATA:  Acute renal injury EXAM: RENAL / URINARY  TRACT ULTRASOUND COMPLETE COMPARISON:  February 24, 2017 FINDINGS: Right Kidney: Length: 9 cm. Increased cortical echogenicity. No mass or hydronephrosis. Left Kidney: Length: 8.5 cm. Mild prominence of the central left renal collecting system as seen on image 26 without  gross hydronephrosis. This was not visualized previously. Bladder: The bladder is decompressed and poorly evaluated but grossly unremarkable. IMPRESSION: Mild prominence of the central left renal collecting system not seen previously. No gross hydronephrosis or caliectasis. Recommend clinical correlation. Electronically Signed   By: Dorise Bullion III M.D   On: 03/29/2017 09:43   Dg Chest Port 1 View  Result Date: 03/29/2017 CLINICAL DATA:  Shortness of breath. History carneae artery disease, inferior STEMI and mitral regurgitation. History of cardiac catheterization. Former smoker. EXAM: PORTABLE CHEST 1 VIEW COMPARISON:  Chest x-rays dated 03/28/2017, 02/23/2017 and 07/29/2015 FINDINGS: Heart size is upper normal. Atherosclerotic changes noted at the aortic arch. There is new central pulmonary vascular congestion and hazy opacities at each lung base which likely represents associated interstitial edema. No significant pleural effusion. No pneumothorax seen. IMPRESSION: 1. Central pulmonary vascular congestion, and probable bibasilar interstitial edema, suggesting mild volume overload/CHF. 2.  Aortic atherosclerosis. Electronically Signed   By: Franki Cabot M.D.   On: 03/29/2017 18:16   Dg Chest Port 1 View  Result Date: 03/28/2017 CLINICAL DATA:  Weakness and confusion tonight.  Near syncope. EXAM: PORTABLE CHEST 1 VIEW COMPARISON:  02/23/2017 FINDINGS: AP portable views of the chest demonstrate no focal airspace consolidation or alveolar edema. The lungs are grossly clear. There is no large effusion or pneumothorax. Cardiac and mediastinal contours appear unremarkable. IMPRESSION: No active disease. Electronically Signed   By: Andreas Newport M.D.   On: 03/28/2017 22:09    Irwin Brakeman, MD  [facility] Pager [phone]  If 7PM-7AM, please contact night-coverage [email or web address] Password [email or web address] 04/06/2017, 10:58 AM   LOS: 9 days

## 2017-04-07 LAB — CBC
HCT: 27.3 % — ABNORMAL LOW (ref 39.0–52.0)
Hemoglobin: 8.6 g/dL — ABNORMAL LOW (ref 13.0–17.0)
MCH: 28.1 pg (ref 26.0–34.0)
MCHC: 31.5 g/dL (ref 30.0–36.0)
MCV: 89.2 fL (ref 78.0–100.0)
PLATELETS: 104 10*3/uL — AB (ref 150–400)
RBC: 3.06 MIL/uL — ABNORMAL LOW (ref 4.22–5.81)
RDW: 20.4 % — AB (ref 11.5–15.5)
WBC: 9.8 10*3/uL (ref 4.0–10.5)

## 2017-04-07 LAB — RENAL FUNCTION PANEL
Albumin: 2.3 g/dL — ABNORMAL LOW (ref 3.5–5.0)
Anion gap: 12 (ref 5–15)
BUN: 88 mg/dL — AB (ref 6–20)
CALCIUM: 8.5 mg/dL — AB (ref 8.9–10.3)
CHLORIDE: 106 mmol/L (ref 101–111)
CO2: 25 mmol/L (ref 22–32)
CREATININE: 3.26 mg/dL — AB (ref 0.61–1.24)
GFR calc Af Amer: 18 mL/min — ABNORMAL LOW (ref >60)
GFR calc non Af Amer: 16 mL/min — ABNORMAL LOW (ref >60)
GLUCOSE: 101 mg/dL — AB (ref 65–99)
Phosphorus: 5.2 mg/dL — ABNORMAL HIGH (ref 2.5–4.6)
Potassium: 4.1 mmol/L (ref 3.5–5.1)
SODIUM: 143 mmol/L (ref 135–145)

## 2017-04-07 MED ORDER — CEFUROXIME AXETIL 500 MG PO TABS
500.0000 mg | ORAL_TABLET | Freq: Every day | ORAL | Status: DC
  Administered 2017-04-07: 500 mg via ORAL
  Filled 2017-04-07: qty 1

## 2017-04-07 NOTE — Progress Notes (Signed)
  [facility] Progress Note    Assessment/ Plan:   1. Acute on CKD 3 in setting of Klebsiella Oxytoca bacteremia.  Scr improving.  Mild prominence of Lt collecting system seen on initial [place] but on FU it has resolved.  He reports that he has a new pt appointment with [place] coming up on May 1st.  Will verify 2. Klebsiella UTI and bacteremia, to finish antibiotics on cefuroxime 3. Met acidosis on oral bicarb 4. HTN- on coreg and clonidine prn but hasn't required; holding ARB.  5. Chronic thrombocytopenia- improving 6. Anemia and Fe def,  IV iron 7. Hypokalemia, improved off lasix   Subjective:    Feels well.  Hopefully to home today.   Objective:   BP (!) 169/55 (BP Location: Left Arm)   Pulse 61   Temp 97.9 F (36.6 C) (Oral)   Resp 18   Ht _0  (1.676 m)   Wt 53.1 kg (117 lb)   SpO2 100%   BMI 18.88 kg/m   Intake/Output Summary (Last 24 hours) at 04/07/17 1342 Last data filed at 04/07/17 0900  Gross per 24 hour  Intake              750 ml  Output              725 ml  Net               25 ml   Weight change: 11.2 kg (24 lb 11.2 oz)  Physical Exam: AVW:PVXYI and alert CVS:RRR no rub Resp: decreased BS bases Abd:+ BS NTND Ext: No edema NEURO:CNI Ox3 no asterixis  Imaging: No results found.  Labs: BMET  Recent Labs Lab 04/01/17 0623 04/02/17 0630 04/03/17 0422 04/04/17 0501 04/05/17 0352 04/06/17 0506 04/07/17 0423  NA 142 143 139 139 141 144 143  K 3.6 3.7 3.1* 2.8* 3.3* 4.2 4.1  CL 113* 111 104 101 101 109 106  CO2 18* 20* 21* _1 GLUCOSE 112* 100* 109* 105* 110* 95 101*  BUN 99* 96* 117* 124* 124* 115* 88*  CREATININE 5.03* 5.58* 5.55* 5.59* 4.77* 3.82* 3.26*  CALCIUM 7.9* 8.4* 8.2* 8.2* 8.2* 8.3* 8.5*  PHOS  --  5.8* 6.2* 6.5* 6.2* 5.2* 5.2*   CBC  Recent Labs Lab 04/02/17 0630 04/04/17 0501 04/05/17 0352 04/07/17 0423  WBC 5.8 11.7* 10.9* 9.8  NEUTROABS 4.7  --   --   --   HGB 7.8* 8.5* 8.0* 8.6*  HCT 24.2*  26.7* 24.7* 27.3*  MCV 86.4 85.6 87.0 89.2  PLT 44* 51* 61* 104*    Medications:    . brinzolamide  1 drop Left Eye Q12H  . budesonide (PULMICORT) nebulizer solution  0.25 mg Nebulization BID  . carvedilol  25 mg Oral BID WC  . cefUROXime  500 mg Oral Q supper  . clopidogrel  75 mg Oral Daily  . ferumoxytol  510 mg Intravenous Weekly  . latanoprost  1 drop Both Eyes QHS  . mouth rinse  15 mL Mouth Rinse BID  . saccharomyces boulardii  250 mg Oral BID  . sodium bicarbonate  650 mg Oral BID  . tamsulosin  0.4 mg Oral QPC supper      Madelon Lips MD 04/07/2017, 1:42 PM

## 2017-04-07 NOTE — Progress Notes (Signed)
PROGRESS NOTE  Nicholas Porter DTO:[id number] DOB: [birth year] DOA: 03/28/2017 PCP: Cathlean Cower, MD  Brief History:  83 y.o.malewith medical history significant of CKD, HTN, CAD comes in with fever, confusion and near syncope event. Found to have sepsis due to UTI most likely.  Still with elevated lactic acid and experienced acute resp distress. BP difficult to control on current therapy.  Pt was transferred to [facility] 4/11 for higher level nephrology services if needed.   Assessment/Plan: Sepsis  -due to klebsiella Oxytoca Urinary tract infection and bacteremia -sepsis physiology resolved.  Klebsiella bacteremia and UTI -complete course of 14 day treatment with oral ceftin based on culture and sensitivity data  Acute kidney injury on chronic kidney disease stage 3 -Baseline creatinine of 1.4-1.6 -Renal ultrasound-- Mild prominence of the central left renal collecting system not seen previously. No gross hydronephrosis or caliectasis. -continue holding nephrotoxic agents -appreciate nephrology follow up -Nephrology team following closely -Cr slowly improving, now 3.26, with improving UOP  Volume overload  -secondary to acute on chronic renal failure, treated with lasix, now much improved -03/29/17 CXR--pulmonary vascular congestion -stable now on RA 99%  Essential hypertension -HoldingARB due to acute kidney injury -labetalol as needed IV -continue coreg 25 mg/BID  CAD/elevated troponin  -Currently stable, no complaints of CP -troponin most likely elevated in presence of demand ischemia and acute renal failure.  -Continue plavix and B-blocker  -recent outpatient stress test and low risk for ischemia -03/30/17-- echo with global hypokinesis (no focal wall abnormalities), EF 45-50%  Chronic anemia of chronic renal failure -no signs of acute bleeding -baseline Hgb~8-9 - follow Hgb trend -IV iron per renal  Diarrhea -resolved -C. Diff neg -continue supportive  care -started on florastor -will use PRN lomotil    Metabolic/Lactic acidosis - resolved -due to infection and acute on chronic renal failure -continue po bicarb, likely can stop soon -given resp distress and edema seen on CXR IVFs stopped on 4/9.  Suspected BPH -patient decrease urine stream and also with nocturia (prior to admission) -continue flomax -had already follow up up arranged with urology service after discharge -renal US--Mild prominence of the central left renal collecting system not seen previously. No gross hydronephrosis or caliectasis. - foley discontinued 4/15 per nephrology team.  Thrombocytopenia -likely due to sepsis -trending up slowly  Disposition Plan:  ?home tomorrow Family Communication:   Family at bedside  Consultants:  nephrology  Code Status:  DNR  DVT Prophylaxis:  SCDs  Subjective: Patient complaining about breakfast order this morning   Objective: Vitals:   04/06/17 1805 04/06/17 1822 04/07/17 0538 04/07/17 0904  BP: (!) 190/56  (!) 142/58 (!) 169/55  Pulse: 65  62 61  Resp: 18  17 18   Temp: 98.4 F (36.9 C)  98.3 F (36.8 C) 97.9 F (36.6 C)  TempSrc: Oral  Oral Oral  SpO2: 100% 96% 98% 100%  Weight: 53.1 kg (117 lb)     Height:        Intake/Output Summary (Last 24 hours) at 04/07/17 1153 Last data filed at 04/07/17 0900  Gross per 24 hour  Intake              750 ml  Output              975 ml  Net             -225 ml   Weight change: 11.2 kg (24 lb 11.2 oz)  ROS: as noted in history otherwise all reviewed and reported negative  Exam:   General:  Pt is alert, follows commands appropriately, not in acute distress  HEENT: No icterus, No thrush, No neck mass, [place]/AT  Cardiovascular: nl S1/S2, no rubs, no gallops  Respiratory: bibasilar crackles, no wheezing, no crackles, no rhonchi  Abdomen: Soft/+BS, non tender, non distended, no guarding  Extremities: No edema, No lymphangitis, No petechiae  Data  Reviewed: I have personally reviewed following labs and imaging studies Basic Metabolic Panel:  Recent Labs Lab 04/03/17 0422 04/04/17 0501 04/04/17 0834 04/05/17 0352 04/06/17 0506 04/07/17 0423  NA 139 139  --  141 144 143  K 3.1* 2.8*  --  3.3* 4.2 4.1  CL 104 101  --  101 109 106  CO2 21* 23  --  24 23 25   GLUCOSE 109* 105*  --  110* 95 101*  BUN 117* 124*  --  124* 115* 88*  CREATININE 5.55* 5.59*  --  4.77* 3.82* 3.26*  CALCIUM 8.2* 8.2*  --  8.2* 8.3* 8.5*  MG  --   --  1.9  --   --   --   PHOS 6.2* 6.5*  --  6.2* 5.2* 5.2*   Liver Function Tests:  Recent Labs Lab 04/03/17 0422 04/04/17 0501 04/05/17 0352 04/06/17 0506 04/07/17 0423  ALBUMIN 2.0* 2.1* 2.1* 2.1* 2.3*   No results for input(s): LIPASE, AMYLASE in the last 168 hours. No results for input(s): AMMONIA in the last 168 hours. Coagulation Profile: No results for input(s): INR, PROTIME in the last 168 hours. CBC:  Recent Labs Lab 04/02/17 0630 04/04/17 0501 04/05/17 0352 04/07/17 0423  WBC 5.8 11.7* 10.9* 9.8  NEUTROABS 4.7  --   --   --   HGB 7.8* 8.5* 8.0* 8.6*  HCT 24.2* 26.7* 24.7* 27.3*  MCV 86.4 85.6 87.0 89.2  PLT 44* 51* 61* 104*   Cardiac Enzymes: No results for input(s): CKTOTAL, CKMB, CKMBINDEX, TROPONINI in the last 168 hours. BNP: Invalid input(s): POCBNP CBG: No results for input(s): GLUCAP in the last 168 hours. HbA1C: No results for input(s): HGBA1C in the last 72 hours. Urine analysis:    Component Value Date/Time   COLORURINE YELLOW 04/01/2017 1702   APPEARANCEUR HAZY (A) 04/01/2017 1702   LABSPEC 1.013 04/01/2017 1702   PHURINE 5.0 04/01/2017 1702   GLUCOSEU NEGATIVE 04/01/2017 1702   GLUCOSEU NEGATIVE 03/11/2017 1605   HGBUR SMALL (A) 04/01/2017 1702   HGBUR negative 12/06/2009 0836   BILIRUBINUR NEGATIVE 04/01/2017 1702   BILIRUBINUR negative 02/13/2017 1025   KETONESUR NEGATIVE 04/01/2017 1702   PROTEINUR NEGATIVE 04/01/2017 1702   UROBILINOGEN 0.2  03/11/2017 1605   NITRITE NEGATIVE 04/01/2017 1702   LEUKOCYTESUR NEGATIVE 04/01/2017 1702    Recent Results (from the past 240 hour(s))  Culture, blood (Routine x 2)     Status: Abnormal   Collection Time: 03/28/17  9:40 PM  Result Value Ref Range Status   Specimen Description BLOOD LAC  Final   Special Requests BOTTLES DRAWN AEROBIC AND ANAEROBIC BCAV  Final   Culture  Setup Time   Final    GRAM NEGATIVE RODS IN BOTH AEROBIC AND ANAEROBIC BOTTLES CRITICAL RESULT CALLED TO, READ BACK BY AND VERIFIED WITH: D. WOFFARD, PHARM AT [facility], 03/29/17 AT 1624 BY J FUDESCO Performed at [facility], [place] [address] [place], [place] [address]    Culture KLEBSIELLA OXYTOCA (A)  Final   Report Status 03/31/2017 FINAL  Final   Organism ID, Bacteria KLEBSIELLA OXYTOCA  Final      Susceptibility   Klebsiella oxytoca - MIC*    AMPICILLIN >=32 RESISTANT Resistant     CEFAZOLIN 8 SENSITIVE Sensitive     CEFEPIME <=1 SENSITIVE Sensitive     CEFTAZIDIME <=1 SENSITIVE Sensitive     CEFTRIAXONE <=1 SENSITIVE Sensitive     CIPROFLOXACIN <=0.25 SENSITIVE Sensitive     GENTAMICIN <=1 SENSITIVE Sensitive     IMIPENEM <=0.25 SENSITIVE Sensitive     TRIMETH/SULFA <=20 SENSITIVE Sensitive     AMPICILLIN/SULBACTAM 16 INTERMEDIATE Intermediate     PIP/TAZO <=4 SENSITIVE Sensitive     Extended ESBL NEGATIVE Sensitive     * KLEBSIELLA OXYTOCA  Blood Culture ID Panel (Reflexed)     Status: Abnormal   Collection Time: 03/28/17  9:40 PM  Result Value Ref Range Status   Enterococcus species NOT DETECTED NOT DETECTED Final   Listeria monocytogenes NOT DETECTED NOT DETECTED Final   Staphylococcus species NOT DETECTED NOT DETECTED Final   Staphylococcus aureus NOT DETECTED NOT DETECTED Final   Streptococcus species NOT DETECTED NOT DETECTED Final   Streptococcus agalactiae NOT DETECTED NOT DETECTED Final   Streptococcus pneumoniae NOT DETECTED NOT DETECTED Final   Streptococcus pyogenes NOT DETECTED NOT  DETECTED Final   Acinetobacter baumannii NOT DETECTED NOT DETECTED Final   Enterobacteriaceae species DETECTED (A) NOT DETECTED Final    Comment: Enterobacteriaceae represent a large family of gram-negative bacteria, not a single organism. CRITICAL RESULT CALLED TO, READ BACK BY AND VERIFIED WITH: D. WOFFARD, PHARM AT [facility], 03/29/17 AT 1623 BY J FUDESCO    Enterobacter cloacae complex NOT DETECTED NOT DETECTED Final   Escherichia coli NOT DETECTED NOT DETECTED Final   Klebsiella oxytoca DETECTED (A) NOT DETECTED Final    Comment: CRITICAL RESULT CALLED TO, READ BACK BY AND VERIFIED WITH: D. WOFFARD, PHARM AT [facility], 03/29/17 AT 1623 BY J FUDESCO    Klebsiella pneumoniae NOT DETECTED NOT DETECTED Final   Proteus species NOT DETECTED NOT DETECTED Final   Serratia marcescens NOT DETECTED NOT DETECTED Final   Carbapenem resistance NOT DETECTED NOT DETECTED Final   Haemophilus influenzae NOT DETECTED NOT DETECTED Final   Neisseria meningitidis NOT DETECTED NOT DETECTED Final   Pseudomonas aeruginosa NOT DETECTED NOT DETECTED Final   Candida albicans NOT DETECTED NOT DETECTED Final   Candida glabrata NOT DETECTED NOT DETECTED Final   Candida krusei NOT DETECTED NOT DETECTED Final   Candida parapsilosis NOT DETECTED NOT DETECTED Final   Candida tropicalis NOT DETECTED NOT DETECTED Final    Comment: Performed at [facility], [address], [place], [place] [address]  Culture, blood (Routine x 2)     Status: Abnormal   Collection Time: 03/28/17  9:45 PM  Result Value Ref Range Status   Specimen Description BLOOD RIGHT ARM  Final   Special Requests IN PEDIATRIC BOTTLE BCAV  Final   Culture  Setup Time   Final    GRAM NEGATIVE RODS IN PEDIATRIC BOTTLE CRITICAL VALUE NOTED.  VALUE IS CONSISTENT WITH PREVIOUSLY REPORTED AND CALLED VALUE.    Culture (A)  Final    KLEBSIELLA OXYTOCA SUSCEPTIBILITIES PERFORMED ON PREVIOUS CULTURE WITHIN THE LAST 5 DAYS. Performed at [facility], [place] [address], [place], [place] [address]    Report Status 03/31/2017 FINAL  Final  Urine culture     Status: Abnormal   Collection Time: 03/28/17 11:57 PM  Result Value Ref Range  Status   Specimen Description URINE, CLEAN CATCH  Final   Special Requests NONE  Final   Culture >=100,000 COLONIES/mL KLEBSIELLA OXYTOCA (A)  Final   Report Status 03/31/2017 FINAL  Final   Organism ID, Bacteria KLEBSIELLA OXYTOCA (A)  Final      Susceptibility   Klebsiella oxytoca - MIC*    AMPICILLIN >=32 RESISTANT Resistant     CEFAZOLIN 8 SENSITIVE Sensitive     CEFTRIAXONE <=1 SENSITIVE Sensitive     CIPROFLOXACIN <=0.25 SENSITIVE Sensitive     GENTAMICIN <=1 SENSITIVE Sensitive     IMIPENEM <=0.25 SENSITIVE Sensitive     NITROFURANTOIN 32 SENSITIVE Sensitive     TRIMETH/SULFA <=20 SENSITIVE Sensitive     AMPICILLIN/SULBACTAM 16 INTERMEDIATE Intermediate     PIP/TAZO <=4 SENSITIVE Sensitive     Extended ESBL NEGATIVE Sensitive     * >=100,000 COLONIES/mL KLEBSIELLA OXYTOCA  MRSA PCR Screening     Status: None   Collection Time: 03/29/17  1:14 AM  Result Value Ref Range Status   MRSA by PCR NEGATIVE NEGATIVE Final    Comment:        The GeneXpert MRSA Assay (FDA approved for NASAL specimens only), is one component of a comprehensive MRSA colonization surveillance program. It is not intended to diagnose MRSA infection nor to guide or monitor treatment for MRSA infections.   C difficile quick scan w PCR reflex     Status: None   Collection Time: 03/29/17  3:08 PM  Result Value Ref Range Status   C Diff antigen NEGATIVE NEGATIVE Final   C Diff toxin NEGATIVE NEGATIVE Final   C Diff interpretation No C. difficile detected.  Final     Scheduled Meds: . brinzolamide  1 drop Left Eye Q12H  . budesonide (PULMICORT) nebulizer solution  0.25 mg Nebulization BID  . carvedilol  25 mg Oral BID WC  . cefUROXime  500 mg Oral Q supper  . clopidogrel  75 mg Oral Daily  . ferumoxytol  510 mg Intravenous  Weekly  . latanoprost  1 drop Both Eyes QHS  . mouth rinse  15 mL Mouth Rinse BID  . saccharomyces boulardii  250 mg Oral BID  . sodium bicarbonate  650 mg Oral BID  . tamsulosin  0.4 mg Oral QPC supper   Continuous Infusions:   Procedures/Studies: US Renal  Result Date: 04/04/2017 CLINICAL DATA:  Recent left-sided caliectasis EXAM: RENAL / URINARY TRACT ULTRASOUND COMPLETE COMPARISON:  February 26, 2017 FINDINGS: Right Kidney: Length: 9.9 cm. Echogenicity within normal limits. There is renal cortical thinning. No mass, perinephric fluid, or hydronephrosis visualized. No sonographically demonstrable calculus or ureterectasis. Left Kidney: Length: 10.0 cm. Echogenicity within normal limits. There is renal cortical thinning. No mass, perinephric fluid, or hydronephrosis visualized. There has been interval resolution of caliectasis on the left. No sonographically demonstrable calculus or ureterectasis. Bladder: Appears normal for degree of bladder distention. A Foley catheter is in place, although there is still modest urine within the bladder. There are bilateral pleural effusions. IMPRESSION: Interval resolution of caliectasis on the left. Currently no obstructing focus in either kidney. There is renal cortical thinning bilaterally, a finding that may be seen with either age or medical renal disease. Note that the renal echogenicity is normal bilaterally. No new lesion. Incidental note is made of bilateral pleural effusions. Electronically Signed   By: Lowella Grip III M.D.   On: 04/04/2017 10:44   US Renal  Result Date: 03/29/2017 CLINICAL DATA:  Acute renal  injury EXAM: RENAL / URINARY TRACT ULTRASOUND COMPLETE COMPARISON:  February 24, 2017 FINDINGS: Right Kidney: Length: 9 cm. Increased cortical echogenicity. No mass or hydronephrosis. Left Kidney: Length: 8.5 cm. Mild prominence of the central left renal collecting system as seen on image 26 without gross hydronephrosis. This was not visualized  previously. Bladder: The bladder is decompressed and poorly evaluated but grossly unremarkable. IMPRESSION: Mild prominence of the central left renal collecting system not seen previously. No gross hydronephrosis or caliectasis. Recommend clinical correlation. Electronically Signed   By: Dorise Bullion III M.D   On: 03/29/2017 09:43   Dg Chest Port 1 View  Result Date: 03/29/2017 CLINICAL DATA:  Shortness of breath. History carneae artery disease, inferior STEMI and mitral regurgitation. History of cardiac catheterization. Former smoker. EXAM: PORTABLE CHEST 1 VIEW COMPARISON:  Chest x-rays dated 03/28/2017, 02/23/2017 and 07/29/2015 FINDINGS: Heart size is upper normal. Atherosclerotic changes noted at the aortic arch. There is new central pulmonary vascular congestion and hazy opacities at each lung base which likely represents associated interstitial edema. No significant pleural effusion. No pneumothorax seen. IMPRESSION: 1. Central pulmonary vascular congestion, and probable bibasilar interstitial edema, suggesting mild volume overload/CHF. 2.  Aortic atherosclerosis. Electronically Signed   By: Franki Cabot M.D.   On: 03/29/2017 18:16   Dg Chest Port 1 View  Result Date: 03/28/2017 CLINICAL DATA:  Weakness and confusion tonight.  Near syncope. EXAM: PORTABLE CHEST 1 VIEW COMPARISON:  02/23/2017 FINDINGS: AP portable views of the chest demonstrate no focal airspace consolidation or alveolar edema. The lungs are grossly clear. There is no large effusion or pneumothorax. Cardiac and mediastinal contours appear unremarkable. IMPRESSION: No active disease. Electronically Signed   By: Andreas Newport M.D.   On: 03/28/2017 22:09    Irwin Brakeman, MD  [facility] Pager [phone]  If 7PM-7AM, please contact night-coverage [email or web address] Password [email or web address] 04/07/2017, 11:53 AM   LOS: 10 days

## 2017-04-07 NOTE — Progress Notes (Signed)
Physical Therapy Treatment Patient Details Name: Nicholas Porter MRN: [id number] DOB: 1930/11/05 Today's Date: 04/07/2017    History of Present Illness Pt admitted with confusion and near syncope and dx with sepsis 2* UTI.  Pt with hx of DKC, HTN, CAD and MI    PT Comments    Activity tolerance continues to be limited by BP drop with upright activity; Notified Dr. Wynetta Emery via Amion of Orthostatic vitals; I'm concerned about dc home tomorrow if his orthostatic hypotension isn't resolved.    04/07/17 1355  Vital Signs  Patient Position (if appropriate) Orthostatic Vitals  Orthostatic Lying   BP- Lying 121/65  Pulse- Lying 85  Orthostatic Sitting  BP- Sitting (!) 85/41  Pulse- Sitting 86     Follow Up Recommendations  Home health PT;Supervision/Assistance - 24 hour  If orthostatic hypotension isn't resolved soon, may need to consider SNF     Equipment Recommendations  None recommended by PT    Recommendations for Other Services OT consult     Precautions / Restrictions Precautions Precautions: Fall Precaution Comments: Orthostatic Hypotension with activity    Mobility  Bed Mobility Overal bed mobility: Needs Assistance Bed Mobility: Rolling;Supine to Sit;Sit to Supine Rolling: Min assist   Supine to sit: Mod assist Sit to supine: Min assist   General bed mobility comments: Pt had stooled upon arrival; Assited with hygeine as he rolled multiple times; light mod assist to elevate trunk to sit; visibly fatigued sitting EOB, coupled with a drop of more than 30 mmHg in systolic BP between supine and sitting; symptomatic as well; assisted back supine and onto bedpan  Transfers                 General transfer comment: Held transfers and amb due to drop in BP  Ambulation/Gait             General Gait Details: Held transfers and amb due to drop in BP   Stairs            Wheelchair Mobility    Modified Rankin (Stroke Patients Only)        Balance                                            Cognition Arousal/Alertness: Awake/alert Behavior During Therapy: WFL for tasks assessed/performed Overall Cognitive Status: Within Functional Limits for tasks assessed                                        Exercises      General Comments General comments (skin integrity, edema, etc.): Supplemental O2 off of pt upon entry; Session conducted on Room Air and O2 sats 97-99%      Pertinent Vitals/Pain Pain Assessment: Faces Faces Pain Scale: Hurts little more Pain Location: back and neck arthritis Pain Descriptors / Indicators: Aching Pain Intervention(s): Repositioned    Home Living                      Prior Function            PT Goals (current goals can now be found in the care plan section) Acute Rehab PT Goals Patient Stated Goal: HOME PT Goal Formulation: With patient Time For Goal Achievement: 04/12/17 Potential to Achieve Goals: Fair  Progress towards PT goals: Not progressing toward goals - comment (Limited by orhtostatic BPs)    Frequency    Min 3X/week      PT Plan Other (comment) (If he does not progress, may need to consider SNF)    Co-evaluation             End of Session   Activity Tolerance: Other (comment) (limited by orthostatic hypotension) Patient left: in bed;with call bell/phone within reach;with bed alarm set Nurse Communication: Mobility status PT Visit Diagnosis: Unsteadiness on feet (R26.81);Other abnormalities of gait and mobility (R26.89)     Time: 8325-4982 PT Time Calculation (min) (ACUTE ONLY): 22 min  Charges:  $Therapeutic Activity: 8-22 mins                    G Codes:       Roney Marion, PT  Acute Rehabilitation Services Pager [phone] Office [phone]    Colletta [place] 04/07/2017, 2:59 PM

## 2017-04-08 DIAGNOSIS — I951 Orthostatic hypotension: Secondary | ICD-10-CM | POA: Diagnosis present

## 2017-04-08 LAB — RENAL FUNCTION PANEL
ANION GAP: 12 (ref 5–15)
Albumin: 2.4 g/dL — ABNORMAL LOW (ref 3.5–5.0)
BUN: 75 mg/dL — ABNORMAL HIGH (ref 6–20)
CHLORIDE: 108 mmol/L (ref 101–111)
CO2: 24 mmol/L (ref 22–32)
Calcium: 8.6 mg/dL — ABNORMAL LOW (ref 8.9–10.3)
Creatinine, Ser: 2.96 mg/dL — ABNORMAL HIGH (ref 0.61–1.24)
GFR calc non Af Amer: 18 mL/min — ABNORMAL LOW (ref >60)
GFR, EST AFRICAN AMERICAN: 21 mL/min — AB (ref >60)
GLUCOSE: 107 mg/dL — AB (ref 65–99)
POTASSIUM: 4.2 mmol/L (ref 3.5–5.1)
Phosphorus: 5.5 mg/dL — ABNORMAL HIGH (ref 2.5–4.6)
SODIUM: 144 mmol/L (ref 135–145)

## 2017-04-08 MED ORDER — SODIUM CHLORIDE 0.9 % IV BOLUS (SEPSIS)
250.0000 mL | Freq: Once | INTRAVENOUS | Status: AC
  Administered 2017-04-08: 250 mL via INTRAVENOUS

## 2017-04-08 MED ORDER — TAMSULOSIN HCL 0.4 MG PO CAPS: 0.4000 mg | ORAL_CAPSULE | Freq: Every day | ORAL | 0 refills | Status: AC

## 2017-04-08 MED ORDER — CEFUROXIME AXETIL 500 MG PO TABS: 500.0000 mg | ORAL_TABLET | Freq: Every day | ORAL | 0 refills | Status: AC

## 2017-04-08 NOTE — Care Management Note (Signed)
Case Management Note  Patient Details  Name: Nicholas Porter MRN: [id number] Date of Birth: 01/17/1930  Subjective/Objective:                 Patient admitted with sepsis. From home with wife. Spoke to patient at bedside, he would like to use [facility] for [facility]. Referral made to [facility] clinical liaison. Wife verified he had DME RW and cane. She and daughter plan to provide 24 hour supervision after discharge. Spoke with both patient and wife about using walker when getting up, keeping it in front of him and standing close to chair or bed for aminute prior to walking away due to orthostasis.    Action/Plan:  DC to home w [facility] Expected Discharge Date:  04/08/17               Expected Discharge Plan:  Acute to Acute Transfer  In-House Referral:     Discharge planning Services  CM Consult  Post Acute Care Choice:  Home Health Choice offered to:  Patient, Spouse  DME Arranged:    DME Agency:     HH Arranged:  PT [place]:  [place]  Status of Service:  Completed, signed off  If discussed at [place] of Stay Meetings, dates discussed:    Additional Comments:  Carles Collet, RN 04/08/2017, 2:15 PM

## 2017-04-08 NOTE — Progress Notes (Signed)
[name] to be D/C'd Home per MD order.  Discussed prescriptions and follow up appointments with the patient. Prescriptions given to patient, medication list explained in detail. Pt verbalized understanding.  Allergies as of 04/08/2017      Reactions   Amlodipine Besylate Hives   Hydralazine Itching   Lipitor [atorvastatin] Itching   Naproxen Diarrhea   Oxycodone-acetaminophen Itching   Isosorbide Rash      Medication List    STOP taking these medications   losartan 50 MG tablet Commonly known as:  COZAAR     TAKE these medications   brinzolamide 1 % ophthalmic suspension Commonly known as:  AZOPT Place 1 drop into the left eye every 12 (twelve) hours.   cefUROXime 500 MG tablet Commonly known as:  CEFTIN Take 1 tablet (500 mg total) by mouth daily with supper.   clopidogrel 75 MG tablet Commonly known as:  PLAVIX TAKE 1 TABLET BY MOUTH DAILY What changed:  See the new instructions.   ferrous sulfate 325 (65 FE) MG EC tablet Take 325 mg by mouth daily with breakfast.   metoprolol succinate 25 MG 24 hr tablet Commonly known as:  TOPROL-XL Take 1.5 tablets (37.5 mg total) by mouth daily.   nitroGLYCERIN 0.4 MG SL tablet Commonly known as:  NITROSTAT Place 1 tablet (0.4 mg total) under the tongue every 5 (five) minutes as needed. For chest pain.   saccharomyces boulardii 250 MG capsule Commonly known as:  FLORASTOR Take 1 capsule (250 mg total) by mouth 2 (two) times daily.   tamsulosin 0.4 MG Caps capsule Commonly known as:  FLOMAX Take 1 capsule (0.4 mg total) by mouth daily after supper.   TRAVATAN Z 0.004 % Soln ophthalmic solution Generic drug:  Travoprost (BAK Free) Place 1 drop into both eyes at bedtime.       Vitals:   04/08/17 1000 04/08/17 1600  BP: (!) 154/91 (!) 149/70  Pulse: 64 67  Resp: 18   Temp: 98.6 F (37 C) 98.4 F (36.9 C)    Skin clean, dry and intact without evidence of skin break down, no evidence of skin tears noted. IV  catheter discontinued intact. Site without signs and symptoms of complications. Dressing and pressure applied. Pt denies pain at this time. No complaints noted.  An After Visit Summary was printed and given to the patient. Patient escorted via [place], and D/C home via private auto.  [name] BSN, RN [facility] Phone [phone]

## 2017-04-08 NOTE — Discharge Summary (Signed)
Physician Discharge Summary  Nicholas Porter BJY:[id number] DOB: 1930/10/16 DOA: 03/28/2017  PCP: Cathlean Cower, MD  Admit date: 03/28/2017 Discharge date: 04/08/2017  Admitted From: Home   Recommendations for Outpatient Follow-up:  1. Follow up with PCP in 1 weeks 2. Follow up with nephrologist as scheduled or in 2-3 weeks 3. Please obtain BMP/CBC in one week 4. Please monitor patient for orthostatic hypotension and fall risk 5. Take oral ceftin for 5 more days then STOP  Discharge Condition: STABLE  CODE STATUS: DNR  Brief/Interim Summary: HPI: Nicholas Porter is a 83 y.o. gentleman with a history of CAD S/P RCA stent (recent outpatient stress test low risk for ischemia), carotid artery disease S/P left CEA, HTN, CKD 3 (baseline creatinine around 1.6), and anemia of chronic kidney disease who was admitted from 3/4 - 3/9 for a Klebsiella UTI.  He was treated with IV Rocephin, then transitioned to oral cipro, which he completed as directed.  Today, EMS was called for new onset weakness, confusion, subjective fever at home, chills, and a near syncopal episode while up to the restroom.  He also had a single episode of explosive diarrhea, which was watery per his wife.  He has had nausea but no vomiting.  He reports urinary urgency but denies dysuria or odor.  En route to the ED, he received 400cc NS.  He had tachypnea and tachycardia.  ED Course: Rectal temp 104.  Lactic acid level 2.69.  Patient treated per sepsis protocol.  A total of 2750cc of NS ordered in the ED.  He has received vanc and zosyn.  Blood and urine cultures ordered.  WBC count 8.  Hgb 9.5.  Platelet 76 (appears to be new).  Creatinine 3 (almost twice his baseline).  Bicarb 13.  Hospitalist asked to admit.  Brief History:  83 y.o.malewith medical history significant of CKD, HTN, CAD comes in with fever, confusion and near syncope event. Found to have sepsis due to UTI most likely.  Still with elevated lactic acid and  experienced acute resp distress. BP difficult to control on current therapy.  Pt was transferred to [facility] 4/11 for higher level nephrology services if needed.   Assessment/Plan: Sepsis  -due to klebsiella Oxytoca Urinary tract infection and bacteremia -sepsis physiology resolved.  Klebsiella bacteremia and UTI -complete course of 14 day treatment with oral ceftin based on culture and sensitivity data, Pt discharged with 5 more days of oral ceftin.    Acute kidney injury on chronic kidney disease stage 3 -Baseline creatinine of 1.4-1.6 -Renal ultrasound-- Mild prominence of the central left renal collecting system not seen previously. No gross hydronephrosis or caliectasis. -continue holding nephrotoxic agents -appreciate nephrology follow up -Nephrology team followed closely and cleared him for discharge home -Cr slowly improving, now 2.96, with improving UOP  Volume overload  -secondary to acute on chronic renal failure, treated with lasix, now much improved, lasix discontinued -03/29/17 CXR--pulmonary vascular congestion -stable now on RA 99%  Essential hypertension -HoldingARB due to acute kidney injury - did not restart at discharge  Orthostatic hypotension - likely multifactorial given advanced age and on high dose coreg 25 BID which has been discontinued -Pt had been started on coreg 25 BID in hospital but due to orthostatic hypotension it was discontinued and he will resume his home metoprolol XR at discharge. Bolus 250 cc NS prior to discharge.  -fall precautions at home, discussed with wife, will offer SNF placement if declines then [facility] PT will be arranged. Close follow up  with PCP.   CAD/elevated troponin  -Currently stable, no complaints of CP -troponin most likely elevated in presence of demand ischemia and acute renal failure.  -Continue plavix and B-blocker  -recent outpatient stress test and low risk for ischemia -03/30/17-- echo with global hypokinesis (no  focal wall abnormalities), EF 45-50%  Chronic anemia of chronic renal failure -no signs of acute bleeding -baseline Hgb~8-9 - follow Hgb trend -IV iron per renal  Diarrhea -resolved -C. Diff neg -continue supportive care -started on florastor -will use PRN lomotil    Metabolic/Lactic acidosis - resolved -due to infection and acute on chronic renal failure -continue po bicarb, likely can stop soon -given resp distress and edema seen on CXR IVFs stopped on 4/9.  Suspected BPH -patient decrease urine stream and also with nocturia (prior to admission) -continue flomax as he has had good results with this -had already follow up up arranged with urology service after discharge -renal US--Mild prominence of the central left renal collecting system not seen previously. No gross hydronephrosis or caliectasis. - foley discontinued 4/15 per nephrology team.  Thrombocytopenia -likely due to sepsis -trending up slowly, at 104 on day of discharge  Family Communication:  wife by telephone  Consultants:  nephrology  Code Status:  DNR  DVT Prophylaxis:  SCDs  Discharge Diagnoses:  Principal Problem:   Sepsis ([place]) Active Problems:   Essential hypertension   CAD S/P percutaneous coronary angioplasty - DES in RCA with PTCA for ISR; Moderate mLAD, 80% ostial OM2 stable   Left-sided carotid artery disease -- s/p CEA   Thrombocytopenia ([facility])   Acute renal failure superimposed on stage 4 chronic kidney disease ([place])   Acute metabolic encephalopathy   Fever   Elevated lactic acid level   Klebsiella sepsis ([place])   Acute renal failure superimposed on stage 3 chronic kidney disease ([place])   UTI due to Klebsiella species  Discharge Instructions  Discharge Instructions    Increase activity slowly    Complete by:  As directed      Allergies as of 04/08/2017      Reactions   Amlodipine Besylate Hives   Hydralazine Itching   Lipitor [atorvastatin] Itching   Naproxen  Diarrhea   Oxycodone-acetaminophen Itching   Isosorbide Rash      Medication List    STOP taking these medications   losartan 50 MG tablet Commonly known as:  COZAAR     TAKE these medications   brinzolamide 1 % ophthalmic suspension Commonly known as:  AZOPT Place 1 drop into the left eye every 12 (twelve) hours.   cefUROXime 500 MG tablet Commonly known as:  CEFTIN Take 1 tablet (500 mg total) by mouth daily with supper.   clopidogrel 75 MG tablet Commonly known as:  PLAVIX TAKE 1 TABLET BY MOUTH DAILY What changed:  See the new instructions.   ferrous sulfate 325 (65 FE) MG EC tablet Take 325 mg by mouth daily with breakfast.   metoprolol succinate 25 MG 24 hr tablet Commonly known as:  TOPROL-XL Take 1.5 tablets (37.5 mg total) by mouth daily.   nitroGLYCERIN 0.4 MG SL tablet Commonly known as:  NITROSTAT Place 1 tablet (0.4 mg total) under the tongue every 5 (five) minutes as needed. For chest pain.   saccharomyces boulardii 250 MG capsule Commonly known as:  FLORASTOR Take 1 capsule (250 mg total) by mouth 2 (two) times daily.   tamsulosin 0.4 MG Caps capsule Commonly known as:  FLOMAX Take 1 capsule (0.4 mg total)  by mouth daily after supper.   TRAVATAN Z 0.004 % Soln ophthalmic solution Generic drug:  Travoprost (BAK Free) Place 1 drop into both eyes at bedtime.      Follow-up Information    Cathlean Cower, MD. Schedule an appointment as soon as possible for a visit in 1 week(s).   Specialties:  Internal Medicine, Radiology Contact information: [address] [place] [address] [phone]          Allergies  Allergen Reactions  . Amlodipine Besylate Hives  . Hydralazine Itching  . Lipitor [Atorvastatin] Itching  . Naproxen Diarrhea  . Oxycodone-Acetaminophen Itching  . Isosorbide Rash   Procedures/Studies: US Renal  Result Date: 04/04/2017 CLINICAL DATA:  Recent left-sided caliectasis EXAM: RENAL / URINARY TRACT ULTRASOUND  COMPLETE COMPARISON:  February 26, 2017 FINDINGS: Right Kidney: Length: 9.9 cm. Echogenicity within normal limits. There is renal cortical thinning. No mass, perinephric fluid, or hydronephrosis visualized. No sonographically demonstrable calculus or ureterectasis. Left Kidney: Length: 10.0 cm. Echogenicity within normal limits. There is renal cortical thinning. No mass, perinephric fluid, or hydronephrosis visualized. There has been interval resolution of caliectasis on the left. No sonographically demonstrable calculus or ureterectasis. Bladder: Appears normal for degree of bladder distention. A Foley catheter is in place, although there is still modest urine within the bladder. There are bilateral pleural effusions. IMPRESSION: Interval resolution of caliectasis on the left. Currently no obstructing focus in either kidney. There is renal cortical thinning bilaterally, a finding that may be seen with either age or medical renal disease. Note that the renal echogenicity is normal bilaterally. No new lesion. Incidental note is made of bilateral pleural effusions. Electronically Signed   By: Lowella Grip III M.D.   On: 04/04/2017 10:44   US Renal  Result Date: 03/29/2017 CLINICAL DATA:  Acute renal injury EXAM: RENAL / URINARY TRACT ULTRASOUND COMPLETE COMPARISON:  February 24, 2017 FINDINGS: Right Kidney: Length: 9 cm. Increased cortical echogenicity. No mass or hydronephrosis. Left Kidney: Length: 8.5 cm. Mild prominence of the central left renal collecting system as seen on image 26 without gross hydronephrosis. This was not visualized previously. Bladder: The bladder is decompressed and poorly evaluated but grossly unremarkable. IMPRESSION: Mild prominence of the central left renal collecting system not seen previously. No gross hydronephrosis or caliectasis. Recommend clinical correlation. Electronically Signed   By: Dorise Bullion III M.D   On: 03/29/2017 09:43   Dg Chest Port 1 View  Result Date:  03/29/2017 CLINICAL DATA:  Shortness of breath. History carneae artery disease, inferior STEMI and mitral regurgitation. History of cardiac catheterization. Former smoker. EXAM: PORTABLE CHEST 1 VIEW COMPARISON:  Chest x-rays dated 03/28/2017, 02/23/2017 and 07/29/2015 FINDINGS: Heart size is upper normal. Atherosclerotic changes noted at the aortic arch. There is new central pulmonary vascular congestion and hazy opacities at each lung base which likely represents associated interstitial edema. No significant pleural effusion. No pneumothorax seen. IMPRESSION: 1. Central pulmonary vascular congestion, and probable bibasilar interstitial edema, suggesting mild volume overload/CHF. 2.  Aortic atherosclerosis. Electronically Signed   By: Franki Cabot M.D.   On: 03/29/2017 18:16   Dg Chest Port 1 View  Result Date: 03/28/2017 CLINICAL DATA:  Weakness and confusion tonight.  Near syncope. EXAM: PORTABLE CHEST 1 VIEW COMPARISON:  02/23/2017 FINDINGS: AP portable views of the chest demonstrate no focal airspace consolidation or alveolar edema. The lungs are grossly clear. There is no large effusion or pneumothorax. Cardiac and mediastinal contours appear unremarkable. IMPRESSION: No active disease.  Electronically Signed   By: Andreas Newport M.D.   On: 03/28/2017 22:09     Subjective: Pt without complaints today.    Discharge Exam: Vitals:   04/08/17 0438 04/08/17 1000  BP: (!) 164/54 (!) 154/91  Pulse: 62 64  Resp: 18 18  Temp: 98 F (36.7 C) 98.6 F (37 C)   Vitals:   04/07/17 1954 04/07/17 2025 04/08/17 0438 04/08/17 1000  BP:  (!) 151/56 (!) 164/54 (!) 154/91  Pulse:  70 62 64  Resp:  17 18 18   Temp:  98.4 F (36.9 C) 98 F (36.7 C) 98.6 F (37 C)  TempSrc:    Oral  SpO2: 96% 100% 98% 98%  Weight:      Height:        General:  Pt is alert, follows commands appropriately, not in acute distress  HEENT: No icterus, No thrush, No neck mass, [place]/AT  Cardiovascular: nl S1/S2, no  rubs, no gallops  Respiratory: no wheezing, no crackles, no rhonchi  Abdomen: Soft/+BS, non tender, non distended, no guarding  Extremities: No edema, No lymphangitis, No petechiae  The results of significant diagnostics from this hospitalization (including imaging, microbiology, ancillary and laboratory) are listed below for reference.     Microbiology: Recent Results (from the past 240 hour(s))  C difficile quick scan w PCR reflex     Status: None   Collection Time: 03/29/17  3:08 PM  Result Value Ref Range Status   C Diff antigen NEGATIVE NEGATIVE Final   C Diff toxin NEGATIVE NEGATIVE Final   C Diff interpretation No C. difficile detected.  Final     Labs: BNP (last 3 results) No results for input(s): BNP in the last 8760 hours. Basic Metabolic Panel:  Recent Labs Lab 04/04/17 0501 04/04/17 0834 04/05/17 0352 04/06/17 0506 04/07/17 0423 04/08/17 0437  NA 139  --  141 144 143 144  K 2.8*  --  3.3* 4.2 4.1 4.2  CL 101  --  101 109 106 108  CO2 23  --  24 23 25 24   GLUCOSE 105*  --  110* 95 101* 107*  BUN 124*  --  124* 115* 88* 75*  CREATININE 5.59*  --  4.77* 3.82* 3.26* 2.96*  CALCIUM 8.2*  --  8.2* 8.3* 8.5* 8.6*  MG  --  1.9  --   --   --   --   PHOS 6.5*  --  6.2* 5.2* 5.2* 5.5*   Liver Function Tests:  Recent Labs Lab 04/04/17 0501 04/05/17 0352 04/06/17 0506 04/07/17 0423 04/08/17 0437  ALBUMIN 2.1* 2.1* 2.1* 2.3* 2.4*   No results for input(s): LIPASE, AMYLASE in the last 168 hours. No results for input(s): AMMONIA in the last 168 hours. CBC:  Recent Labs Lab 04/02/17 0630 04/04/17 0501 04/05/17 0352 04/07/17 0423  WBC 5.8 11.7* 10.9* 9.8  NEUTROABS 4.7  --   --   --   HGB 7.8* 8.5* 8.0* 8.6*  HCT 24.2* 26.7* 24.7* 27.3*  MCV 86.4 85.6 87.0 89.2  PLT 44* 51* 61* 104*   Cardiac Enzymes: No results for input(s): CKTOTAL, CKMB, CKMBINDEX, TROPONINI in the last 168 hours. BNP: Invalid input(s): POCBNP CBG: No results for input(s):  GLUCAP in the last 168 hours. D-Dimer No results for input(s): DDIMER in the last 72 hours. Hgb A1c No results for input(s): HGBA1C in the last 72 hours. Lipid Profile No results for input(s): CHOL, HDL, LDLCALC, TRIG, CHOLHDL, LDLDIRECT in the last 72  hours. Thyroid function studies No results for input(s): TSH, T4TOTAL, T3FREE, THYROIDAB in the last 72 hours.  Invalid input(s): FREET3 Anemia work up No results for input(s): VITAMINB12, FOLATE, FERRITIN, TIBC, IRON, RETICCTPCT in the last 72 hours. Urinalysis    Component Value Date/Time   COLORURINE YELLOW 04/01/2017 1702   APPEARANCEUR HAZY (A) 04/01/2017 1702   LABSPEC 1.013 04/01/2017 1702   PHURINE 5.0 04/01/2017 1702   GLUCOSEU NEGATIVE 04/01/2017 1702   GLUCOSEU NEGATIVE 03/11/2017 1605   HGBUR SMALL (A) 04/01/2017 1702   HGBUR negative 12/06/2009 0836   BILIRUBINUR NEGATIVE 04/01/2017 1702   BILIRUBINUR negative 02/13/2017 1025   KETONESUR NEGATIVE 04/01/2017 1702   PROTEINUR NEGATIVE 04/01/2017 1702   UROBILINOGEN 0.2 03/11/2017 1605   NITRITE NEGATIVE 04/01/2017 1702   LEUKOCYTESUR NEGATIVE 04/01/2017 1702   Sepsis Labs Invalid input(s): PROCALCITONIN,  WBC,  LACTICIDVEN Microbiology Recent Results (from the past 240 hour(s))  C difficile quick scan w PCR reflex     Status: None   Collection Time: 03/29/17  3:08 PM  Result Value Ref Range Status   C Diff antigen NEGATIVE NEGATIVE Final   C Diff toxin NEGATIVE NEGATIVE Final   C Diff interpretation No C. difficile detected.  Final   Time coordinating discharge: 33 minutes  SIGNED:  Irwin Brakeman, MD  [facility] 04/08/2017, 11:53 AM Pager [phone]  If 7PM-7AM, please contact night-coverage [email or web address] Password [email or web address]

## 2017-04-08 NOTE — Care Management Important Message (Signed)
Important Message  Patient Details  Name: Nicholas Porter MRN: [id number] Date of Birth: Apr 25, 1930   Medicare Important Message Given:  Yes    [name] [address] 04/08/2017, 2:32 PM

## 2017-04-08 NOTE — Progress Notes (Signed)
Physical Therapy Treatment Patient Details Name: Nicholas Porter MRN: [id number] DOB: Oct 22, 1930 Today's Date: 04/08/2017    History of Present Illness Pt admitted with confusion and near syncope and dx with sepsis 2* UTI.  Pt with hx of DKC, HTN, CAD and MI    PT Comments    Clearly feeling better with much improved mobility today compared to yesterday; Lots of cues to self-monitor for activity tolerance; still plagued by orthostatic hypotension -- was cracking jokes and laughing supine, not talking at all and tending to close eyes standing with BPs as follows:    04/08/17 1126  Vital Signs  Patient Position (if appropriate) Orthostatic Vitals  Orthostatic Lying   BP- Lying 139/50  Pulse- Lying 90  Orthostatic Sitting  BP- Sitting 120/50  Pulse- Sitting 91  Orthostatic Standing at 0 minutes  BP- Standing at 0 minutes (!) 76/54 Symptomatic  Pulse- Standing at 0 minutes 92  Orthostatic Standing at 3 minutes  BP- Standing at 3 minutes (Unable to stand long enough)   Notified Dr. Wynetta Emery of orthostatic BPs via Amion, and discussed case with Lambert Keto, RN; He has an increased risk for falls;   Worth considering TED hose to see if that helps with upright activity tolerance;   I'm concerned with safety at home if he is to dc today.  Follow Up Recommendations  Home health PT;Supervision/Assistance - 24 hour     Equipment Recommendations  None recommended by PT    Recommendations for Other Services OT consult     Precautions / Restrictions Precautions Precautions: Fall Precaution Comments: Orthostatic Hypotension with activity; watch it closely    Mobility  Bed Mobility Overal bed mobility: Needs Assistance Bed Mobility: Supine to Sit     Supine to sit: Supervision     General bed mobility comments: Used bed rail; Moving much better than yesterday  Transfers Overall transfer level: Needs assistance Equipment used: Rolling walker (2 wheeled) Transfers: Sit to/from  Stand Sit to Stand: Min guard         General transfer comment: Cues for hand placement  Ambulation/Gait Ambulation/Gait assistance: Min guard Ambulation Distance (Feet):  (Pivotal steps bed to chair) Assistive device: Rolling walker (2 wheeled) Gait Pattern/deviations: Shuffle;Trunk flexed     General Gait Details: Limited amb due to drop in systolic BP standing   Stairs            Wheelchair Mobility    Modified Rankin (Stroke Patients Only)       Balance     Sitting balance-Leahy Scale: Good       Standing balance-Leahy Scale: Fair                              Cognition Arousal/Alertness: Awake/alert Behavior During Therapy: WFL for tasks assessed/performed Overall Cognitive Status: Within Functional Limits for tasks assessed                                        Exercises      General Comments        Pertinent Vitals/Pain Pain Assessment: No/denies pain    Home Living                      Prior Function            PT Goals (current goals can now be  found in the care plan section) Acute Rehab PT Goals Patient Stated Goal: HOME PT Goal Formulation: With patient Time For Goal Achievement: 04/12/17 Potential to Achieve Goals: Fair Progress towards PT goals: Progressing toward goals (slow progress, limited by orthostasis)    Frequency    Min 3X/week      PT Plan Other (comment) (If he does not progress, may need to consider SNF)    Co-evaluation             End of Session Equipment Utilized During Treatment: Gait belt Activity Tolerance: Other (comment) (limited by orthostatic hypotension) Patient left: in chair;with call bell/phone within reach Nurse Communication: Mobility status;Other (comment) (Orthostatic BPs) PT Visit Diagnosis: Unsteadiness on feet (R26.81);Other abnormalities of gait and mobility (R26.89)     Time: 3704-8889 PT Time Calculation (min) (ACUTE ONLY): 21  min  Charges:  $Therapeutic Activity: 8-22 mins                    G Codes:       Roney Marion, PT  Acute Rehabilitation Services Pager [phone] Office [place] 04/08/2017, 12:19 PM

## 2017-04-08 NOTE — Progress Notes (Signed)
  [facility] Progress Note    Assessment/ Plan:   1. Acute on CKD 3 in setting of Klebsiella Oxytoca bacteremia.  Scr improving.  Mild prominence of Lt collecting system seen on initial [place] but on FU it has resolved.  He reports that he has a new pt appointment with [place] coming up on May 1st.  Will verify and keep appt as scheduled. 2. Klebsiella UTI and bacteremia, to finish antibiotics on cefuroxime 3. Met acidosis on oral bicarb 4. HTN- on coreg and clonidine prn but hasn't required; holding ARB.  5. Chronic thrombocytopenia- improving 6. Anemia and Fe def,  IV iron 7. Hypokalemia, improved off lasix   Subjective:    For d/c today   Objective:   BP (!) 154/91 (BP Location: Left Arm)   Pulse 64   Temp 98.6 F (37 C) (Oral)   Resp 18   Ht '5\' 6"'$  (1.676 m)   Wt 53.1 kg (117 lb)   SpO2 98%   BMI 18.88 kg/m   Intake/Output Summary (Last 24 hours) at 04/08/17 1425 Last data filed at 04/08/17 0900  Gross per 24 hour  Intake              360 ml  Output              600 ml  Net             -240 ml   Weight change:   Physical Exam: ONG:EXBMW and alert CVS:RRR no rub Resp: decreased BS bases Abd:+ BS NTND Ext: No edema NEURO:CNI Ox3 no asterixis  Imaging: No results found.  Labs: BMET  Recent Labs Lab 04/02/17 0630 04/03/17 0422 04/04/17 0501 04/05/17 0352 04/06/17 0506 04/07/17 0423 04/08/17 0437  NA 143 139 139 141 144 143 144  K 3.7 3.1* 2.8* 3.3* 4.2 4.1 4.2  CL 111 104 101 101 109 106 108  CO2 20* 21* '23 24 23 25 24  '$ GLUCOSE 100* 109* 105* 110* 95 101* 107*  BUN 96* 117* 124* 124* 115* 88* 75*  CREATININE 5.58* 5.55* 5.59* 4.77* 3.82* 3.26* 2.96*  CALCIUM 8.4* 8.2* 8.2* 8.2* 8.3* 8.5* 8.6*  PHOS 5.8* 6.2* 6.5* 6.2* 5.2* 5.2* 5.5*   CBC  Recent Labs Lab 04/02/17 0630 04/04/17 0501 04/05/17 0352 04/07/17 0423  WBC 5.8 11.7* 10.9* 9.8  NEUTROABS 4.7  --   --   --   HGB 7.8* 8.5* 8.0* 8.6*  HCT 24.2* 26.7* 24.7* 27.3*  MCV 86.4  85.6 87.0 89.2  PLT 44* 51* 61* 104*    Medications:    . brinzolamide  1 drop Left Eye Q12H  . budesonide (PULMICORT) nebulizer solution  0.25 mg Nebulization BID  . carvedilol  25 mg Oral BID WC  . cefUROXime  500 mg Oral Q supper  . clopidogrel  75 mg Oral Daily  . latanoprost  1 drop Both Eyes QHS  . mouth rinse  15 mL Mouth Rinse BID  . saccharomyces boulardii  250 mg Oral BID  . sodium bicarbonate  650 mg Oral BID  . tamsulosin  0.4 mg Oral QPC supper      Madelon Lips MD 04/08/2017, 2:25 PM

## 2017-04-10 ENCOUNTER — Telehealth: Payer: Self-pay | Admitting: *Deleted

## 2017-04-10 NOTE — Telephone Encounter (Signed)
Transition Care Management Follow-up Telephone Call   Date discharged? 04/08/17   How have you been since you were released from the hospital? Pt states he is doing pretty good   Do you understand why you were in the hospital? YES   Do you understand the discharge instructions? YES   Where were you discharged to? Home   Items Reviewed:  Medications reviewed: YES  Allergies reviewed: YES  Dietary changes reviewed: NO  Referrals reviewed: NO   Functional Questionnaire:   Activities of Daily Living (ADLs):   He states he are independent in the following: bathing and hygiene, feeding, continence, grooming, toileting and dressing States he require assistance with the following: ambulation   Any transportation issues/concerns?: NO   Any patient concerns? NO   Confirmed importance and date/time of follow-up visits scheduled YES, appt 04/15/17  Provider Appointment booked with Dr. Jenny Reichmann  Confirmed with patient if condition begins to worsen call PCP or go to the ER.  Patient was given the office number and encouraged to call back with question or concerns.  : YES

## 2017-04-14 ENCOUNTER — Ambulatory Visit: Payer: Medicare Other | Admitting: Cardiology

## 2017-04-15 ENCOUNTER — Encounter: Payer: Self-pay | Admitting: Internal Medicine

## 2017-04-15 ENCOUNTER — Other Ambulatory Visit: Payer: Self-pay | Admitting: Internal Medicine

## 2017-04-15 ENCOUNTER — Ambulatory Visit (INDEPENDENT_AMBULATORY_CARE_PROVIDER_SITE_OTHER): Payer: Medicare Other | Admitting: Internal Medicine

## 2017-04-15 ENCOUNTER — Ambulatory Visit: Payer: Medicare Other | Admitting: Cardiology

## 2017-04-15 ENCOUNTER — Other Ambulatory Visit (INDEPENDENT_AMBULATORY_CARE_PROVIDER_SITE_OTHER): Payer: Medicare Other

## 2017-04-15 VITALS — BP 180/70 | HR 70 | Wt 120.0 lb

## 2017-04-15 DIAGNOSIS — B961 Klebsiella pneumoniae [K. pneumoniae] as the cause of diseases classified elsewhere: Secondary | ICD-10-CM

## 2017-04-15 DIAGNOSIS — Z9861 Coronary angioplasty status: Secondary | ICD-10-CM | POA: Diagnosis not present

## 2017-04-15 DIAGNOSIS — F99 Mental disorder, not otherwise specified: Secondary | ICD-10-CM | POA: Diagnosis not present

## 2017-04-15 DIAGNOSIS — N184 Chronic kidney disease, stage 4 (severe): Secondary | ICD-10-CM

## 2017-04-15 DIAGNOSIS — N179 Acute kidney failure, unspecified: Secondary | ICD-10-CM

## 2017-04-15 DIAGNOSIS — D631 Anemia in chronic kidney disease: Secondary | ICD-10-CM

## 2017-04-15 DIAGNOSIS — N390 Urinary tract infection, site not specified: Secondary | ICD-10-CM

## 2017-04-15 DIAGNOSIS — I10 Essential (primary) hypertension: Secondary | ICD-10-CM

## 2017-04-15 DIAGNOSIS — I2510 Atherosclerotic heart disease of native coronary artery without angina pectoris: Secondary | ICD-10-CM

## 2017-04-15 DIAGNOSIS — B9689 Other specified bacterial agents as the cause of diseases classified elsewhere: Secondary | ICD-10-CM

## 2017-04-15 LAB — CBC WITH DIFFERENTIAL/PLATELET
BASOS PCT: 1.4 % (ref 0.0–3.0)
Basophils Absolute: 0.1 10*3/uL (ref 0.0–0.1)
EOS ABS: 0.2 10*3/uL (ref 0.0–0.7)
Eosinophils Relative: 2.3 % (ref 0.0–5.0)
HCT: 27.8 % — ABNORMAL LOW (ref 39.0–52.0)
HEMOGLOBIN: 9 g/dL — AB (ref 13.0–17.0)
LYMPHS ABS: 2.2 10*3/uL (ref 0.7–4.0)
Lymphocytes Relative: 32.4 % (ref 12.0–46.0)
MCHC: 32.3 g/dL (ref 30.0–36.0)
MCV: 91.4 fl (ref 78.0–100.0)
MONO ABS: 0.7 10*3/uL (ref 0.1–1.0)
Monocytes Relative: 10.9 % (ref 3.0–12.0)
NEUTROS PCT: 53 % (ref 43.0–77.0)
Neutro Abs: 3.6 10*3/uL (ref 1.4–7.7)
PLATELETS: 125 10*3/uL — AB (ref 150.0–400.0)
RBC: 3.04 Mil/uL — ABNORMAL LOW (ref 4.22–5.81)
RDW: 21.7 % — AB (ref 11.5–15.5)
WBC: 6.8 10*3/uL (ref 4.0–10.5)

## 2017-04-15 LAB — URINALYSIS, ROUTINE W REFLEX MICROSCOPIC
Bilirubin Urine: NEGATIVE
Ketones, ur: NEGATIVE
Nitrite: NEGATIVE
PH: 5.5 (ref 5.0–8.0)
SPECIFIC GRAVITY, URINE: 1.025 (ref 1.000–1.030)
UROBILINOGEN UA: 0.2 (ref 0.0–1.0)
Urine Glucose: NEGATIVE

## 2017-04-15 LAB — BASIC METABOLIC PANEL
BUN: 37 mg/dL — AB (ref 6–23)
CHLORIDE: 107 meq/L (ref 96–112)
CO2: 22 mEq/L (ref 19–32)
Calcium: 9 mg/dL (ref 8.4–10.5)
Creatinine, Ser: 2.38 mg/dL — ABNORMAL HIGH (ref 0.40–1.50)
GFR: 27.73 mL/min — ABNORMAL LOW (ref >60.00)
GLUCOSE: 112 mg/dL — AB (ref 70–99)
POTASSIUM: 4.8 meq/L (ref 3.5–5.1)
SODIUM: 137 meq/L (ref 135–145)

## 2017-04-15 LAB — HEPATIC FUNCTION PANEL
ALK PHOS: 60 U/L (ref 39–117)
ALT: 9 U/L (ref 0–53)
AST: 17 U/L (ref 0–37)
Albumin: 3.4 g/dL — ABNORMAL LOW (ref 3.5–5.2)
BILIRUBIN TOTAL: 0.5 mg/dL (ref 0.2–1.2)
Bilirubin, Direct: 0.1 mg/dL (ref 0.0–0.3)
Total Protein: 6.7 g/dL (ref 6.0–8.3)

## 2017-04-15 MED ORDER — CEPHALEXIN 500 MG PO CAPS: 500.0000 mg | ORAL_CAPSULE | Freq: Three times a day (TID) | ORAL | 0 refills | Status: AC

## 2017-04-15 MED ORDER — QUETIAPINE FUMARATE 25 MG PO TABS: 25.0000 mg | ORAL_TABLET | Freq: Two times a day (BID) | ORAL | 3 refills | Status: DC

## 2017-04-15 NOTE — Assessment & Plan Note (Signed)
stable overall by history and exam, recent data reviewed with pt, and pt to continue medical treatment as before,  to f/u any worsening symptoms or concerns Lab Results  Component Value Date   WBC 6.8 04/15/2017   HGB 9.0 (L) 04/15/2017   HCT 27.8 (L) 04/15/2017   MCV 91.4 04/15/2017   PLT 125.0 (L) 04/15/2017

## 2017-04-15 NOTE — Assessment & Plan Note (Signed)
asympt, also for f/u UA today, r/o worsening UTI possibly related to psych worsening

## 2017-04-15 NOTE — Assessment & Plan Note (Addendum)
Etiology unclear but with agitation and hallucinosis - for trial seroquel 25 bid, consider 50 bid if not improved, declines psychiatric referral for now  Note:  Total time for pt hx, exam, review of record with pt in the room, determination of diagnoses and plan for further eval and tx is > 40 min, with over 50% spent in coordination and counseling of patient

## 2017-04-15 NOTE — Assessment & Plan Note (Signed)
Mod elevated today, likely an element of reactive elev BP due to current state; pt declines any change in med at any rate, to follow closely at home and next visit

## 2017-04-15 NOTE — Progress Notes (Signed)
Pre visit review using our clinic review tool, if applicable. No additional management support is needed unless otherwise documented below in the visit note. 

## 2017-04-15 NOTE — Assessment & Plan Note (Signed)
Improved, stable overall by history and exam, recent data reviewed with pt, and pt to continue medical treatment as before,  to f/u any worsening symptoms or concerns Lab Results  Component Value Date   CREATININE 2.38 (H) 04/15/2017

## 2017-04-15 NOTE — Progress Notes (Signed)
Subjective:    Patient ID: Nicholas Porter, male    DOB: 04/10/1930, 83 y.o.   MRN: [id number]  HPI  Pt here to f/u after recent hospn, apr 6-apr 17, with a history of CAD S/P RCA stent (recent outpatient stress test low risk for ischemia), carotid artery disease S/P left CEA, HTN, CKD 3 (baseline creatinine around 1.6), and anemia of chronic kidney disease who was admitted from 3/4 - 3/9 for a Klebsiella UTI  Fond apr 6 in ED with temp 104, elvated lactic acid, and sepsis due to klebsiella Oxytoca Urinary tract infection and bacteremia.  Pt complete course of 14 day treatment with oral ceftin based on culture and sensitivity data, Pt discharged with 5 more days of oral ceftin.  Also in with AKI on CKD in setting of volume overload, ARB held and did not restart at time of d/c.  Had evidence for orthostasis during hospn, pt offered SNF but declined. Transient diarrhea has resolved, c diff neg.  Also noted to have urinary reduced flow, foley d/c 4/15 per nephrology, and pt felt appropriate for urology outpatient f/u - has appt.  Has renal appt per family with him, not sure of date.  Pt overall improved stamina.  Today is quite belligerent and bellicose, agitated though not threatening or hostile.  Family reports more aggrevated behavior since at home, has hallucinations occasiaonly.  Denies worsening depressive symptoms, suicidal ideation, or panic Past Medical History:  Diagnosis Date  . Anemia of chronic renal failure, stage 4 (severe) ([place]) 11/27/2015  . 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 CAD S/P percutaneous coronary angioplasty - DES in RCA with PTCA for ISR; Moderate mLAD, 80% ostial OM2 stable 05/22/2008   Qualifier: Diagnosis of  By: Linda Hedges MD, Heinz Knuckles   . CKD (chronic kidney disease) stage 3, GFR 30-59 ml/min   . Essential hypertension 06/30/2007   Qualifier: Diagnosis of  By: Cori Razor RN, Mikal Plane    . H/O  Inferior STEMI (09/2013) emergent PCI with Promus DES to RCA (3.0 mm  x 28 mm - 3.2 mm) 10/05/2013  . Left-sided carotid artery disease -- s/p CEA 04/06/2006   S/p Left CEA   . Mitral regurgitation - onexam 09/03/2016   Past Surgical History:  Procedure Laterality Date  . AMPUTATION     left index finger -tramatic  . CARDIAC CATHETERIZATION  November 2011   40% mid LAD, 50% proximal RCA, 30-40% distal RCA (DOMINANT RCA with wraparound PDA & 2 large PLBs), 60-70% ostial OM1. No change from 2001. Medical therapy  . CAROTID ENDARTERECTOMY Left 2007   Dr.Hayes: 08/2016 Dopplers: Stable less than 40% right internal carotid stenosis and stable moderate 40-60% left internal carotid stenosis. Patent vertebral and subclavian arteries.  . CARPAL TUNNEL RELEASE    . CORNEAL TRANSPLANT    . CORONARY ANGIOPLASTY WITH STENT PLACEMENT  09/2013; 05/2014    d) Promus DES 3.0 mm x 28 mm (3.2 mm)  to RCA with STEMI; residual 70% mid-distal LAD, OM 270-80%. Medical management; b) PTCA of 75 % ISR , LAD lesion noted as ~40%  . FOOT SURGERY     left  . HERNIA REPAIR    . LEFT HEART CATHETERIZATION WITH CORONARY ANGIOGRAM N/A 10/05/2013   Procedure: LEFT HEART CATHETERIZATION WITH CORONARY ANGIOGRAM;  Surgeon: Peter M Martinique, MD;  Location: [facility] CATH LAB;  Service: Cardiovascular;  RCA 100% - PCI,CxOM stable, LAD ~70%  mid;  . LEFT HEART CATHETERIZATION WITH CORONARY ANGIOGRAM N/A 06/08/2014   Procedure: LEFT HEART CATHETERIZATION WITH CORONARY ANGIOGRAM;  Surgeon: Troy Sine, MD;  Location: [facility] CATH LAB;  Service: Cardiovascular;  UA 6/'15: 75% ISR in RCA - PTCA, LAD lesion ~40%, CxOM stable.    Marland Kitchen NM MYOVIEW LTD  11/2013; 08/1014   a) 12/'14: EF 59%, Fixed Inferior-Inferoapical Defect c/w Inferior Scar; No Ischemia; b) 9/'15:  9/'15: EF 55% - persistent fixed inferior-inferoapical perfusion defect  - read as diaphragmatic attenuation, likely consistent with infarct/scar   . PARTIAL GASTRECTOMY    . PERCUTANEOUS  CORONARY INTERVENTION-BALLOON ONLY  06/08/2014   Procedure: PERCUTANEOUS CORONARY INTERVENTION-BALLOON ONLY;  Surgeon: Troy Sine, MD;  Location: [facility] CATH LAB;  Service: Cardiovascular;;  . ROTATOR CUFF REPAIR     left  . SHOULDER ARTHROSCOPY WITH ROTATOR CUFF REPAIR AND SUBACROMIAL DECOMPRESSION Right 07/01/2013   Procedure: RIGHT SHOULDER ARTHROSCOPY WITH  SUBACROMIAL DECOMPRESSION/DISTAL CLAVICLE RESECTION/ROTATOR CUFF REPAIR ;  Surgeon: Marin Shutter, MD;  Location: [facility];  Service: Orthopedics;  Laterality: Right;  . SPINE SURGERY  1985   cervical laminectomy  . TOTAL KNEE ARTHROPLASTY    . TRANSTHORACIC ECHOCARDIOGRAM  08/2016   Normal EF 55-60%. Mild to moderate aortic stenosis with moderate regurgitation. Mild MR.  . VAGOTOMY     partial gastrectomy    reports that he quit smoking about 52 years ago. His smoking use included Cigarettes. He has never used smokeless tobacco. He reports that he does not drink alcohol or use drugs. family history includes Diabetes in his sister; Heart disease in his sister. Allergies  Allergen Reactions  . Amlodipine Besylate Hives  . Hydralazine Itching  . Lipitor [Atorvastatin] Itching  . Naproxen Diarrhea  . Oxycodone-Acetaminophen Itching  . Isosorbide Rash   Current Outpatient Prescriptions on File Prior to Visit  Medication Sig Dispense Refill  . brinzolamide (AZOPT) 1 % ophthalmic suspension Place 1 drop into the left eye every 12 (twelve) hours.     . clopidogrel (PLAVIX) 75 MG tablet TAKE 1 TABLET BY MOUTH DAILY (Patient taking differently: TAKE  75 MG BY MOUTH DAILY) 90 tablet 2  . ferrous sulfate 325 (65 FE) MG EC tablet Take 325 mg by mouth daily with breakfast.    . metoprolol succinate (TOPROL-XL) 25 MG 24 hr tablet Take 1.5 tablets (37.5 mg total) by mouth daily. 45 tablet 5  . nitroGLYCERIN (NITROSTAT) 0.4 MG SL tablet Place 1 tablet (0.4 mg total) under the tongue every 5 (five) minutes as needed. For chest pain. 25 tablet 6  .  saccharomyces boulardii (FLORASTOR) 250 MG capsule Take 1 capsule (250 mg total) by mouth 2 (two) times daily. 20 capsule 0  . tamsulosin (FLOMAX) 0.4 MG CAPS capsule Take 1 capsule (0.4 mg total) by mouth daily after supper. 30 capsule 0  . TRAVATAN Z 0.004 % SOLN ophthalmic solution Place 1 drop into both eyes at bedtime.      No current facility-administered medications on file prior to visit.    Review of Systems  Constitutional: Negative for other unusual diaphoresis or sweats HENT: Negative for ear discharge or swelling Eyes: Negative for other worsening visual disturbances Respiratory: Negative for stridor or other swelling  Gastrointestinal: Negative for worsening distension or other blood Genitourinary: Negative for retention or other urinary change Musculoskeletal: Negative for other MSK pain or swelling Skin: Negative for color change or other new lesions Neurological: Negative for worsening tremors and other numbness  Psychiatric/Behavioral: Negative  for worsening agitation or other fatigue All other system neg per pt    Objective:   Physical Exam BP (!) 180/70   Pulse 70   Wt 120 lb (54.4 kg)   SpO2 98%   BMI 19.37 kg/m   VS noted, not ill appearing Constitutional: Pt appears in NAD HENT: Head: NCAT.  Right Ear: External ear normal.  Left Ear: External ear normal.  Eyes: . Pupils are equal, round, and reactive to light. Conjunctivae and EOM are normal Nose: without d/c or deformity Neck: Neck supple. Gross normal ROM Cardiovascular: Normal rate and regular rhythm.   Pulmonary/Chest: Effort normal and breath sounds without rales or wheezing.  Abd:  Soft, NT, ND, + BS, no organomegaly Neurological: Pt is alert. At baseline orientation, motor grossly intact Skin: Skin is warm. No rashes, other new lesions, no LE edema Psychiatric: Pt behavior is normal with mild agitation , loud and pressured vocalizing No other exam findings Lab Results  Component Value Date    WBC 6.8 04/15/2017   HGB 9.0 (L) 04/15/2017   HCT 27.8 (L) 04/15/2017   PLT 125.0 (L) 04/15/2017   GLUCOSE 112 (H) 04/15/2017   CHOL 122 07/20/2015   TRIG 106.0 07/20/2015   HDL 37.90 (L) 07/20/2015   LDLCALC 63 07/20/2015   ALT 9 04/15/2017   AST 17 04/15/2017   NA 137 04/15/2017   K 4.8 04/15/2017   CL 107 04/15/2017   CREATININE 2.38 (H) 04/15/2017   BUN 37 (H) 04/15/2017   CO2 22 04/15/2017   TSH 1.641 11/28/2015   PSA 5.66 (H) 10/19/2009   INR 1.26 03/28/2017   HGBA1C 5.3 10/05/2013       Assessment & Plan:

## 2017-04-15 NOTE — Patient Instructions (Signed)
OK to start seroquel 25 mg twice per day  Please continue all other medications as before, and refills have been done if requested.  Please have the pharmacy call with any other refills you may need.  Please keep your appointments with your specialists as you may have planned  Please go to the LAB in the Basement (turn left off the elevator) for the tests to be done today  You will be contacted by phone if any changes need to be made immediately.  Otherwise, you will receive a letter about your results with an explanation, but please check with MyChart first.  Please remember to sign up for MyChart if you have not done so, as this will be important to you in the future with finding out test results, communicating by private email, and scheduling acute appointments online when needed.  Please return in 3 months, or sooner if needed

## 2017-04-16 ENCOUNTER — Telehealth: Payer: Self-pay

## 2017-04-16 NOTE — Telephone Encounter (Signed)
Left a detailed msg on home phone with below instructions/results from Dr. Jenny Reichmann,

## 2017-04-16 NOTE — Telephone Encounter (Signed)
-----   Message from Biagio Borg, MD sent at 04/15/2017  6:51 PM EDT ----- Letter sent, cont same tx - except   The test results show that your current treatment is OK, except the urine testing seems to be showing another infection starting.  We will send an antibiotic to your pharmacy, and you should hear from the office as well.Redmond Baseman to please inform pt, I will do rx

## 2017-04-22 DIAGNOSIS — N179 Acute kidney failure, unspecified: Secondary | ICD-10-CM | POA: Diagnosis not present

## 2017-04-22 DIAGNOSIS — I129 Hypertensive chronic kidney disease with stage 1 through stage 4 chronic kidney disease, or unspecified chronic kidney disease: Secondary | ICD-10-CM | POA: Diagnosis not present

## 2017-04-22 DIAGNOSIS — N183 Chronic kidney disease, stage 3 (moderate): Secondary | ICD-10-CM | POA: Diagnosis not present

## 2017-04-22 LAB — BASIC METABOLIC PANEL
BUN: 35 — AB (ref 4–21)
Creatinine: 2.9 — AB (ref 0.6–1.3)
GLUCOSE: 94
Potassium: 4.9 (ref 3.4–5.3)
Sodium: 135 — AB (ref 137–147)

## 2017-04-23 ENCOUNTER — Ambulatory Visit (INDEPENDENT_AMBULATORY_CARE_PROVIDER_SITE_OTHER): Payer: Medicare Other | Admitting: Cardiology

## 2017-04-23 ENCOUNTER — Encounter: Payer: Self-pay | Admitting: Cardiology

## 2017-04-23 DIAGNOSIS — D631 Anemia in chronic kidney disease: Secondary | ICD-10-CM

## 2017-04-23 DIAGNOSIS — R079 Chest pain, unspecified: Secondary | ICD-10-CM | POA: Diagnosis not present

## 2017-04-23 DIAGNOSIS — N184 Chronic kidney disease, stage 4 (severe): Secondary | ICD-10-CM | POA: Diagnosis not present

## 2017-04-23 DIAGNOSIS — Z9861 Coronary angioplasty status: Secondary | ICD-10-CM | POA: Diagnosis not present

## 2017-04-23 DIAGNOSIS — I2510 Atherosclerotic heart disease of native coronary artery without angina pectoris: Secondary | ICD-10-CM

## 2017-04-23 DIAGNOSIS — E860 Dehydration: Secondary | ICD-10-CM

## 2017-04-23 DIAGNOSIS — I340 Nonrheumatic mitral (valve) insufficiency: Secondary | ICD-10-CM | POA: Diagnosis not present

## 2017-04-23 DIAGNOSIS — I951 Orthostatic hypotension: Secondary | ICD-10-CM

## 2017-04-23 MED ORDER — METOPROLOL SUCCINATE ER 25 MG PO TB24: 25.0000 mg | ORAL_TABLET | Freq: Every day | ORAL | 11 refills | Status: DC

## 2017-04-23 MED ORDER — ASPIRIN EC 81 MG PO TBEC: 81.0000 mg | DELAYED_RELEASE_TABLET | Freq: Every day | ORAL | 3 refills | Status: DC

## 2017-04-23 NOTE — Progress Notes (Signed)
PCP: Cathlean Cower, MD  Clinic Note: Chief Complaint  Patient presents with  . Follow-up    had some chest pain,  shortness of breath, edema, pain or cramping in legs, lightheaded or dizziness  . Hospitalization Follow-up    HPI: Nicholas Porter is a 83 y.o. male with a PMH below who presents today for 1 month/hospital follow-up for CAD-PCI, STEMI. CVD History:  H/O Inferior STEMI (09/2013) emergent PCI with Promus DES to RCA (3.0 mm x 28 mm - 3.2 mm)  Unstable Angina 05/2014: ISR - cutting balloon PTCA ; Ostial OM2 80% (stable) - f/u Myoview in 08/2014 - diaphragmatic attenuation. No Ischemia or Infarction.  L Carotid Dz - s/p CEA 03/2014  Nicholas Porter was last seen on 3/22 he was noticing worsening exertional dyspnea and fatigue. He was also noting significant positional dizziness. Continued weight loss with inability to hold on to his weight. He actually mentioned not only exertional dyspnea but also some chest pressure. Therefore was evaluated with Myoview and echocardiogram noted below.  Recent Hospitalizations:   March 4-9 - Klebsiella UTI, treated with IV Rocephin and converted to Cipro.  April 6-17 - Severe Sepsis; Klebsiella Oxytoca  UTI; A on CKD; losartan was discontinued; apparently on evaluation by EMS he was notably weak, confused with significant subjective fevers (ended up being quite high and emergent). He had near syncope while in the bathroom. This was associated with explosive diarrhea. Nausea but no vomiting. Rectal temperature was 104 and the patient met sepsis criteria. (Creatinine was roughly 2)  Initially went to [facility], then was transferred to [facility] for nephrology assistance.  He was aggressively hydrated and then finally in the being volume overloaded with pulmonary/congestion requiring diuresis with Lasix. -   He had been started on 25 Coreg twice a day which had to be stopped or due to hypotension and he was put back on his home dose of 37.12m of  Toprol; losartan was discontinued during the preceding hospitalization partly due to hypotension and also partly due to acute on chronic renal dysfunction -- I recommended continuing to stay off it until she would be evaluated by nephrology.  Elevated troponin likely related to demand ischemia. EF showed mildly reduced EF.  Noted to have acute on chronic anemia with hemoglobin 8-9. He was started on iron.  Discharge home not on any further Lasix.   Studies Personally Reviewed -( if available, images/films reviewed: From Epic Chart or Care Everywhere)  2D Echo 03/30/17: Mild global reduction in LV systolic function: 499-83% grade 1 DD - high LVEDP. Mild AS/AI/TR. Mild-Mod MR; moderately elevated pulmonary pressure.(Ordered as part of his inpatient evaluation)  Myoview 03/27/17: Normal perfusion. LVEF 51% with normal wall motion. This is a low risk study.  Interval History: [name] today for what was originally scheduled to be follow-up to discuss results of his stress test which showed no of ischemia. Initially, he felt like the stress test may have made him sick and that led to his recent hospitalization. I tried a splint and that was probably not the case. Since his discharge, he still feels tired and fatigued he really can't keep any food down stating that he just has loose stools and medially following any by mouth intake. Trying to maintain some diet but just is hard to keep things down. He is really not having any PND orthopnea or edema. He denies any rapid irregular heartbeats. No rapid irregular rhythms to suggest an arrhythmia. Doesn't seem to be having  any swelling and is not currently on a diuretic.  He is not noting any further episodes of chest discomfort, she simply notes fatigue and in a little bit of dizziness and shortness of breath. He doesn't do a lot right now is actually here on a wheelchair for this visit. He notes that he has been tired and if he walks really fast with  shortness of breath.  CV Review of Symptoms:  No chest pain with rest or exertion.  No PND, orthopnea or edema.  No palpitations, lightheadedness, dizziness, weakness or syncope/near syncope. No TIA/amaurosis fugax symptoms. No melena, hematochezia, hematuria, or epstaxis. No claudication.  ROS: A comprehensive was performed. Review of Systems  Constitutional: Positive for malaise/fatigue and weight loss. Negative for fever.  HENT: Negative for congestion and nosebleeds.   Eyes: Positive for blurred vision.  Respiratory: Positive for shortness of breath (With notable exertion. He just generally feels weak).   Cardiovascular: Negative for leg swelling.  Gastrointestinal: Positive for diarrhea and nausea. Negative for blood in stool, melena and vomiting.  Genitourinary: Positive for dysuria. Negative for hematuria.  Musculoskeletal: Negative for joint pain.  Neurological: Positive for dizziness and weakness. Negative for focal weakness and loss of consciousness.  Psychiatric/Behavioral: Positive for memory loss.    I have reviewed and (if needed) personally updated the patient's problem list, medications, allergies, past medical and surgical history, social and family history. -- Extensive review of recent hospitalizations and and procedures/studies reviewed. Results for studies were entered into the past surgical history as well as above. Extensor review of hospital discharge summary and cardiology notes.  Past Medical History:  Diagnosis Date  . Anemia of chronic renal failure, stage 4 (severe) ([place]) 11/27/2015  . 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 CAD S/P percutaneous coronary angioplasty - DES in RCA with PTCA for ISR; Moderate mLAD, 80% ostial OM2 stable 05/22/2008   Qualifier: Diagnosis of  By: Linda Hedges MD, Heinz Knuckles   . CKD (chronic kidney disease) stage 3, GFR 30-59 ml/min   . Essential  hypertension 06/30/2007   Qualifier: Diagnosis of  By: Cori Razor RN, Mikal Plane    . H/O Inferior STEMI (09/2013) emergent PCI with Promus DES to RCA (3.0 mm  x 28 mm - 3.2 mm) 10/05/2013  . Left-sided carotid artery disease -- s/p CEA 04/06/2006   S/p Left CEA   . Mitral regurgitation - onexam 09/03/2016    Past Surgical History:  Procedure Laterality Date  . AMPUTATION     left index finger -tramatic  . CARDIAC CATHETERIZATION  November 2011   40% mid LAD, 50% proximal RCA, 30-40% distal RCA (DOMINANT RCA with wraparound PDA & 2 large PLBs), 60-70% ostial OM1. No change from 2001. Medical therapy  . CAROTID ENDARTERECTOMY Left 2007   Dr.Hayes: 08/2016 Dopplers: Stable less than 40% right internal carotid stenosis and stable moderate 40-60% left internal carotid stenosis. Patent vertebral and subclavian arteries.  . CARPAL TUNNEL RELEASE    . CORNEAL TRANSPLANT    . CORONARY ANGIOPLASTY WITH STENT PLACEMENT  09/2013; 05/2014    d) Promus DES 3.0 mm x 28 mm (3.2 mm)  to RCA with STEMI; residual 70% mid-distal LAD, OM 270-80%. Medical management; b) PTCA of 75 % ISR , LAD lesion noted as ~40%  . FOOT SURGERY     left  . HERNIA REPAIR    . LEFT HEART CATHETERIZATION WITH CORONARY ANGIOGRAM N/A 10/05/2013  Procedure: LEFT HEART CATHETERIZATION WITH CORONARY ANGIOGRAM;  Surgeon: Peter M Martinique, MD;  Location: [facility] CATH LAB;  Service: Cardiovascular;  RCA 100% - PCI,CxOM stable, LAD ~70% mid;  . LEFT HEART CATHETERIZATION WITH CORONARY ANGIOGRAM N/A 06/08/2014   Procedure: LEFT HEART CATHETERIZATION WITH CORONARY ANGIOGRAM;  Surgeon: Troy Sine, MD;  Location: [facility] CATH LAB;  Service: Cardiovascular;  UA 6/'15: 75% ISR in RCA - PTCA, LAD lesion ~40%, CxOM stable.    Marland Kitchen NM MYOVIEW LTD  11/2013; 08/1014   a) 12/'14: EF 59%, Fixed Inferior-Inferoapical Defect c/w Inferior Scar; No Ischemia; b) 9/'15:  9/'15: EF 55% - persistent fixed inferior-inferoapical perfusion defect  - read as diaphragmatic attenuation,  likely consistent with infarct/scar   . NM MYOVIEW LTD  03/2017   Normal perfusion. LVEF 51% with normal wall motion. This is a low risk study.  Marland Kitchen PARTIAL GASTRECTOMY    . PERCUTANEOUS CORONARY INTERVENTION-BALLOON ONLY  06/08/2014   Procedure: PERCUTANEOUS CORONARY INTERVENTION-BALLOON ONLY;  Surgeon: Troy Sine, MD;  Location: [facility] CATH LAB;  Service: Cardiovascular;;  . ROTATOR CUFF REPAIR     left  . SHOULDER ARTHROSCOPY WITH ROTATOR CUFF REPAIR AND SUBACROMIAL DECOMPRESSION Right 07/01/2013   Procedure: RIGHT SHOULDER ARTHROSCOPY WITH  SUBACROMIAL DECOMPRESSION/DISTAL CLAVICLE RESECTION/ROTATOR CUFF REPAIR ;  Surgeon: Marin Shutter, MD;  Location: [place];  Service: Orthopedics;  Laterality: Right;  . SPINE SURGERY  1985   cervical laminectomy  . TOTAL KNEE ARTHROPLASTY    . TRANSTHORACIC ECHOCARDIOGRAM  09/'17; 4/'18   a) Normal EF 55-60%. Mild to moderate aortic stenosis with moderate regurgitation. Mild MR.;; b) Mild global reduction in LV systolic function: 16-10%; grade 1 DD - high LVEDP. Mild AS/AI/TR. Mild-Mod MR; moderately elevated PAP.  Marland Kitchen VAGOTOMY     partial gastrectomy    Current Meds  Medication Sig  . brinzolamide (AZOPT) 1 % ophthalmic suspension Place 1 drop into the left eye every 12 (twelve) hours.   . cephALEXin (KEFLEX) 500 MG capsule Take 1 capsule (500 mg total) by mouth 3 (three) times daily. (Patient taking differently: Take 500 mg by mouth daily. )  . ferrous sulfate 325 (65 FE) MG EC tablet Take 325 mg by mouth daily with breakfast.  . nitroGLYCERIN (NITROSTAT) 0.4 MG SL tablet Place 1 tablet (0.4 mg total) under the tongue every 5 (five) minutes as needed. For chest pain.  Marland Kitchen QUEtiapine (SEROQUEL) 25 MG tablet Take 1 tablet (25 mg total) by mouth 2 (two) times daily.  Marland Kitchen saccharomyces boulardii (FLORASTOR) 250 MG capsule Take 1 capsule (250 mg total) by mouth 2 (two) times daily.  . tamsulosin (FLOMAX) 0.4 MG CAPS capsule Take 1 capsule (0.4 mg total) by mouth  daily after supper.  . TRAVATAN Z 0.004 % SOLN ophthalmic solution Place 1 drop into both eyes at bedtime.   . [DISCONTINUED] clopidogrel (PLAVIX) 75 MG tablet TAKE 1 TABLET BY MOUTH DAILY (Patient taking differently: TAKE  75 MG BY MOUTH DAILY)  . [DISCONTINUED] metoprolol succinate (TOPROL-XL) 25 MG 24 hr tablet Take 1.5 tablets (37.5 mg total) by mouth daily.    Allergies  Allergen Reactions  . Amlodipine Besylate Hives  . Hydralazine Itching  . Lipitor [Atorvastatin] Itching  . Naproxen Diarrhea  . Oxycodone-Acetaminophen Itching  . Isosorbide Rash    Social History   Social History  . Marital status: Married    Spouse name: N/A  . Number of children: N/A  . Years of education: N/A   Occupational History  .  Retired    Retired   Social History Main Topics  . Smoking status: Former Smoker    Types: Cigarettes    Quit date: 12/23/1964  . Smokeless tobacco: Never Used  . Alcohol use No  . Drug use: No  . Sexual activity: Not Asked   Other Topics Concern  . None   Social History Narrative   He is married with 2 children. He lives with his wife.   Very active at home, prior to a STEMI, he could walk several miles without discomfort. Prior to his MI, he would use his push mower to mow his entire lawn.   He is a former smoker and quit back in 1966. He does not drink alcohol    family history includes Diabetes in his sister; Heart disease in his sister.  Wt Readings from Last 3 Encounters:  04/23/17 117 lb (53.1 kg)  04/15/17 120 lb (54.4 kg)  04/06/17 117 lb (53.1 kg)    PHYSICAL EXAM BP 140/62   Pulse 68   Ht _0  (1.702 m)   Wt 117 lb (53.1 kg)   BMI 18.32 kg/m  General appearance: Thin, frail elderly gentleman who does not appear in any distress. He does feel somewhat fatigued and a bit disheveled. His face is shaved but the neck and throat area is not shaved. He notably has his belt fully looped and wears suspended to keep his pants up because his lost  some weight. HEENT: [place]/AT, EOMI, MMM, anicteric sclera;  arcus senilis  Neck: no adenopathy, no carotid bruit and no JVD Lungs:CTAB, non-labored. Good air movement. Normal to percussion. Heart:RRR, normal S1 & S2.  No change to SEM & DM: soft 1-2/6 early peaking c-d SEM at RUSB as well as ~1/6 DM in same location; Non-displaced (hyperdynamic PMI).  Abdomen: soft, non-tender; bowel sounds normal; no masses,  no organomegaly; scaphoid abdomen. Extremities: extremities normal, atraumatic, no cyanosis, and edema - nonr  Pulses: 2+ and symmetric;  Skin: Overall normal turgor and color. Warm. Just seems thin and frail. Mild bruises Neurologic: Mental status: Alert&oriented x 3.  His thought process is appropriate, but is very circuitous with his answers and hence to go off on tangents. Stable for him.   Adult ECG Report n/a   Other studies Reviewed: Additional studies/ records that were reviewed today include:  Recent Labs:   Lab Results  Component Value Date   CREATININE 2.38 (H) 04/15/2017   BUN 37 (H) 04/15/2017   NA 137 04/15/2017   K 4.8 04/15/2017   CL 107 04/15/2017   CO2 22 04/15/2017  Cr down from 3.82 Lab Results  Component Value Date   CHOL 122 07/20/2015   HDL 37.90 (L) 07/20/2015   LDLCALC 63 07/20/2015   TRIG 106.0 07/20/2015   CHOLHDL 3 07/20/2015    ASSESSMENT / PLAN: Problem List Items Addressed This Visit    Anemia of chronic renal failure, stage 4 (severe) (HCC) (Chronic)    On iron supplement. As long as his levels were low, we can't be sure this is mostly from renal disease arthroscopy from GI bleed. Until we can be sure to stop Plavix and give him a full month before restarting aspirin in order to be less likely to preempt another episode.      CAD S/P percutaneous coronary angioplasty - DES in RCA with PTCA for ISR; Moderate mLAD, 80% ostial OM2 stable (Chronic)    No longer complaining of chest pain. He is now had an  echocardiogram and Myoview both of  which relatively normal. I'm a little fearful of having persistent anemia while being on Plavix. At this point I would like to fully stop Plavix, and start aspirin after one month.  He is otherwise on beta blocker. Not on statin due to myalgia issues.      Relevant Medications   metoprolol succinate (TOPROL XL) 25 MG 24 hr tablet   aspirin EC 81 MG tablet   Chest pain with moderate risk for cardiac etiology    Despite having mention the chest pain at that time, he has not had any further symptoms. His stress test does not suggest ischemia.      Dehydration    We need to figure out some way to hydrate him and keeping nourished. Talk about medication age has eaten and drinks probably fluid.      Mitral regurgitation - on exam    Only mild/moderate on recent echo. Continue to monitor and only reassess if symptoms warrant or if the murmur is louder.      Relevant Medications   metoprolol succinate (TOPROL XL) 25 MG 24 hr tablet   aspirin EC 81 MG tablet   Orthostatic hypotension    Blood pressure is actually pretty high today not being on ARB with only the Toprol. At this point I think he needs renal perfusion. With his fatigue if he really can back off on the Toprol dose to 25 mg and see how he does.  Probably exacerbated by dehydration secondary to his diarrhea. We need to ensure that he is adequately hydrated. - Continue to hold any diuretic as well as ARB.      Relevant Medications   metoprolol succinate (TOPROL XL) 25 MG 24 hr tablet   aspirin EC 81 MG tablet      Current medicines are reviewed at length with the patient today. (+/- concerns) needs clarification on Meds (after d/c) The following changes have been made: see below.  Patient Instructions  Medication change  Stop plavix (clopidogrel ) now After one month start taking Aspirin 81 mg one tablet daily.   Decrease metoprolol succinate 25 mg one tablet daily.   Your physician recommends that you schedule a  follow-up appointment in [place] wants you to follow-up in FEB 2019 [place].You will receive a reminder letter in the mail two months in advance. If you don't receive a letter, please call our office to schedule the follow-up appointment.  If you need a refill on your cardiac medications before your next appointment, please call your pharmacy.    Studies Ordered:   No orders of the defined types were placed in this encounter.     Glenetta Hew, M.D., M.S. Interventional Cardiologist   Pager # [phone] Phone # [phone] [address] [place], [place] [address]

## 2017-04-23 NOTE — Patient Instructions (Addendum)
Medication change  Stop plavix (clopidogrel ) now After one month start taking Aspirin 81 mg one tablet daily.   Decrease metoprolol succinate 25 mg one tablet daily.   Your physician recommends that you schedule a follow-up appointment in [place] wants you to follow-up in FEB 2019 [place].You will receive a reminder letter in the mail two months in advance. If you don't receive a letter, please call our office to schedule the follow-up appointment.  If you need a refill on your cardiac medications before your next appointment, please call your pharmacy.

## 2017-04-24 ENCOUNTER — Encounter: Payer: Self-pay | Admitting: Cardiology

## 2017-04-24 NOTE — Assessment & Plan Note (Signed)
No longer complaining of chest pain. He is now had an echocardiogram and Myoview both of which relatively normal. I'm a little fearful of having persistent anemia while being on Plavix. At this point I would like to fully stop Plavix, and start aspirin after one month.  He is otherwise on beta blocker. Not on statin due to myalgia issues.

## 2017-04-24 NOTE — Assessment & Plan Note (Signed)
Despite having mention the chest pain at that time, he has not had any further symptoms. His stress test does not suggest ischemia.

## 2017-04-24 NOTE — Assessment & Plan Note (Signed)
Only mild/moderate on recent echo. Continue to monitor and only reassess if symptoms warrant or if the murmur is louder.

## 2017-04-24 NOTE — Assessment & Plan Note (Signed)
Blood pressure is actually pretty high today not being on ARB with only the Toprol. At this point I think he needs renal perfusion. With his fatigue if he really can back off on the Toprol dose to 25 mg and see how he does.  Probably exacerbated by dehydration secondary to his diarrhea. We need to ensure that he is adequately hydrated. - Continue to hold any diuretic as well as ARB.

## 2017-04-24 NOTE — Assessment & Plan Note (Signed)
On iron supplement. As long as his levels were low, we can't be sure this is mostly from renal disease arthroscopy from GI bleed. Until we can be sure to stop Plavix and give him a full month before restarting aspirin in order to be less likely to preempt another episode.

## 2017-04-24 NOTE — Assessment & Plan Note (Signed)
We need to figure out some way to hydrate him and keeping nourished. Talk about medication age has eaten and drinks probably fluid.

## 2017-04-30 ENCOUNTER — Encounter: Payer: Self-pay | Admitting: Internal Medicine

## 2017-05-20 ENCOUNTER — Encounter: Payer: Self-pay | Admitting: Internal Medicine

## 2017-05-21 DIAGNOSIS — N401 Enlarged prostate with lower urinary tract symptoms: Secondary | ICD-10-CM | POA: Diagnosis not present

## 2017-05-21 DIAGNOSIS — R3914 Feeling of incomplete bladder emptying: Secondary | ICD-10-CM | POA: Diagnosis not present

## 2017-05-22 IMAGING — US US RENAL
1 series · 14 of 25 positions shown · non-contrast
Comparison: February 24, 2017

CLINICAL DATA: Acute renal injury

EXAM:
RENAL / URINARY TRACT ULTRASOUND COMPLETE

[Series 1: us renal · 0.23mm/px · 14 of 124 slices shown]
[im 1/124]
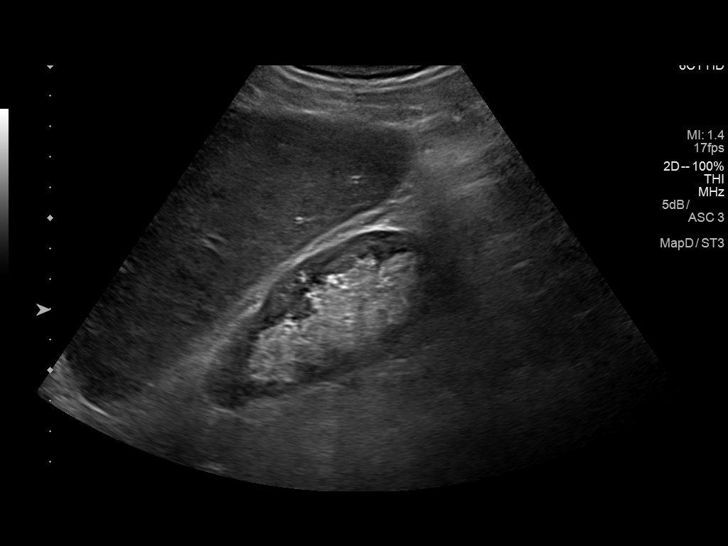
[im 11/124]
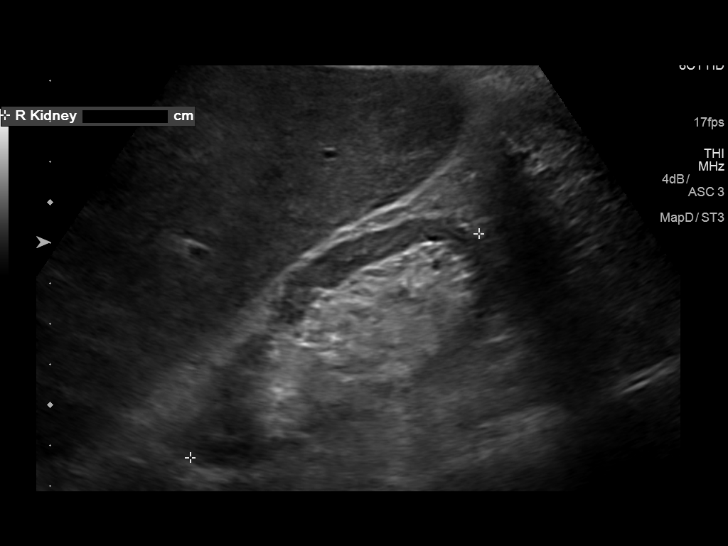
[im 21/124]
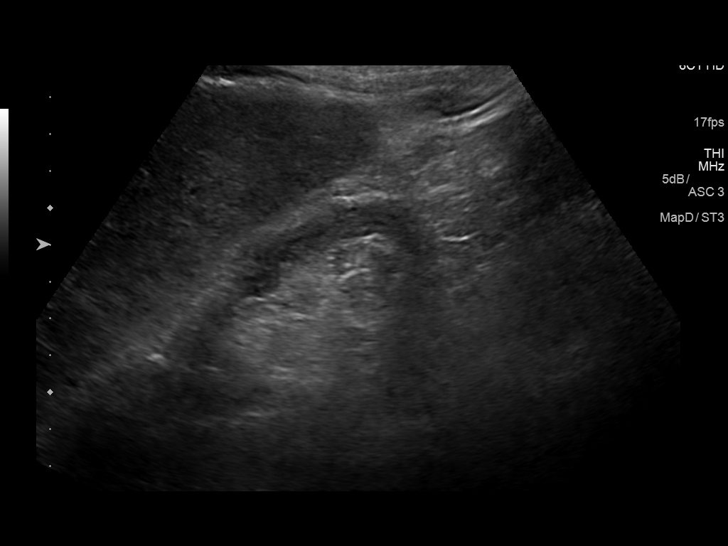
[im 31/124]
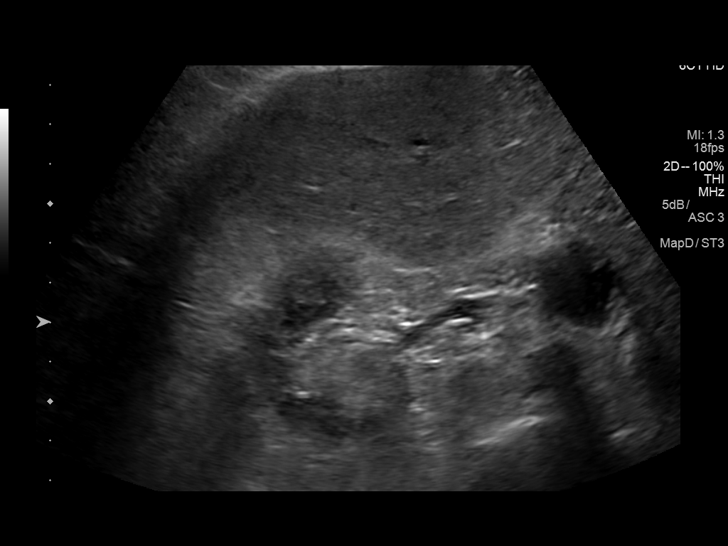
[im 42/124]
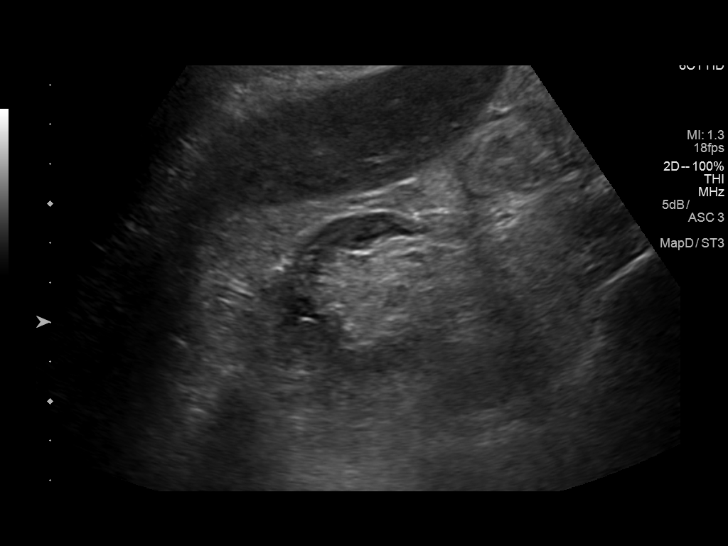
[im 47/124]
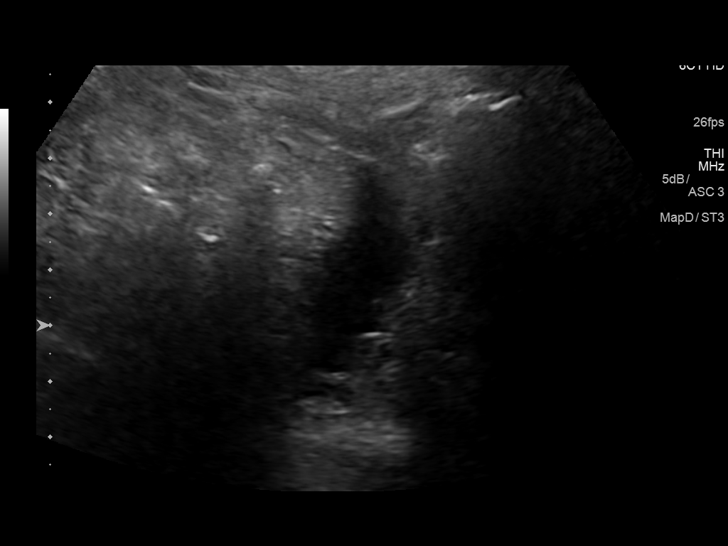
[im 57/124]
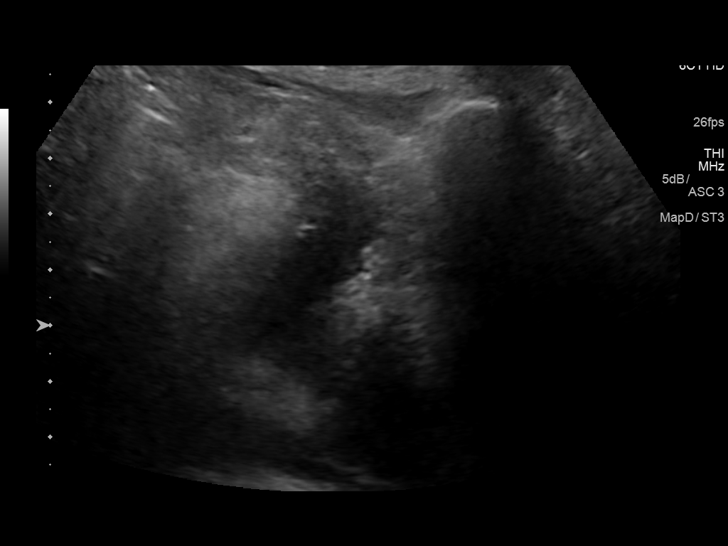
[im 67/124]
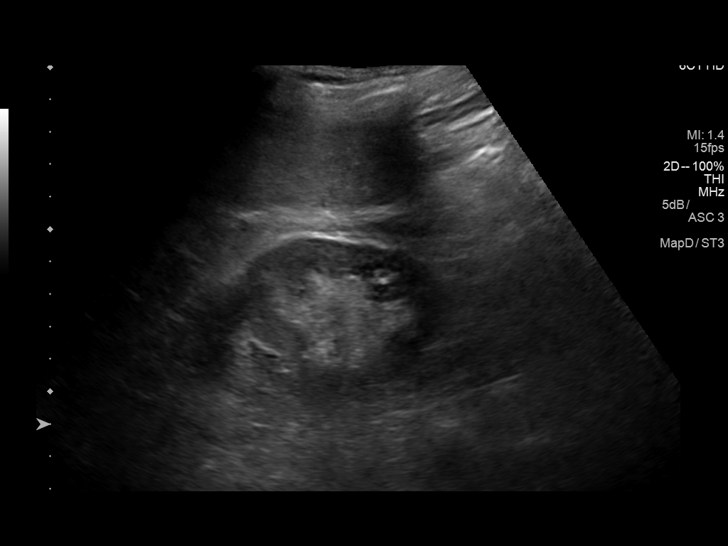
[im 77/124]
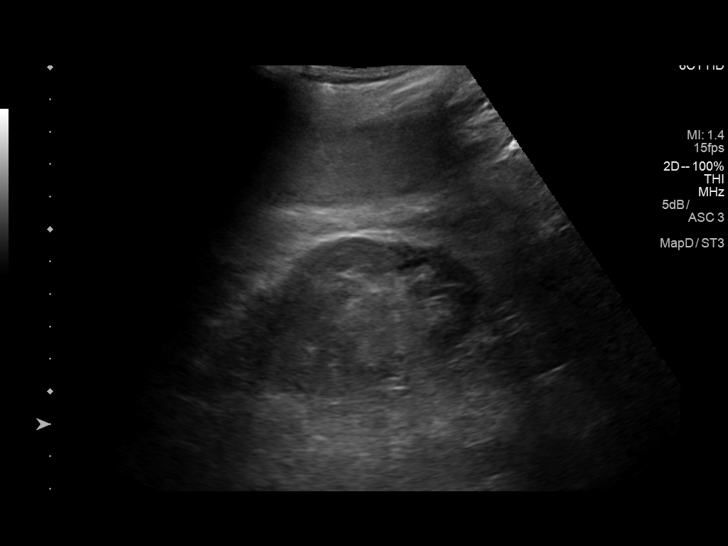
[im 83/124]
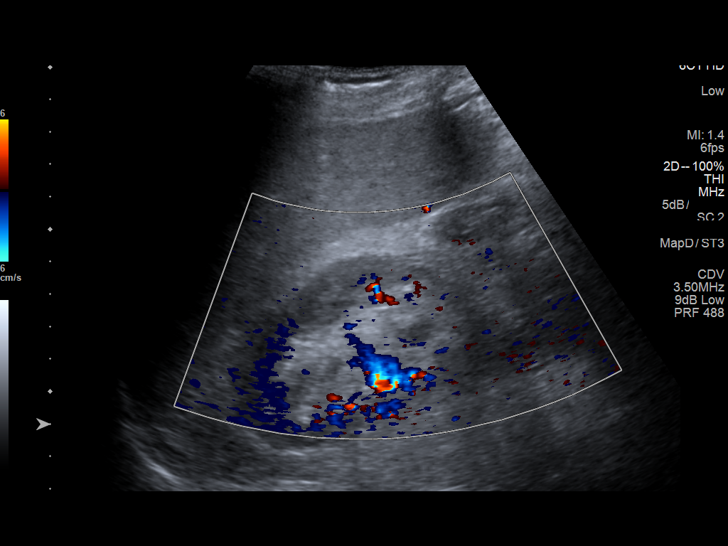
[im 93/124]
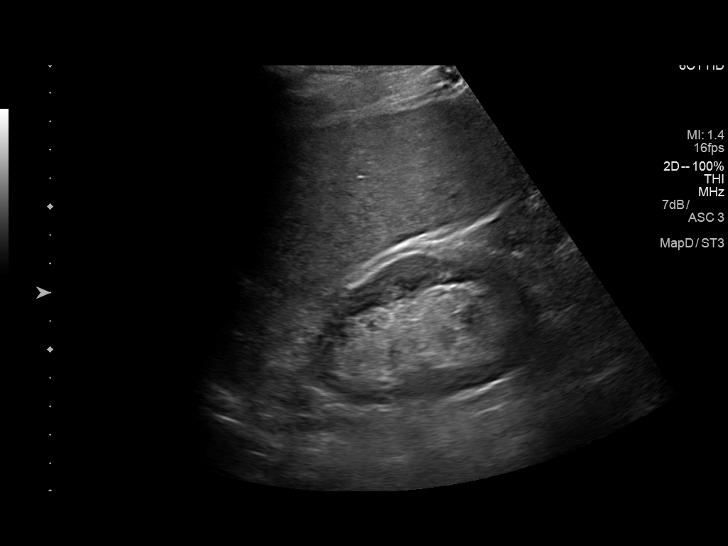
[im 103/124]
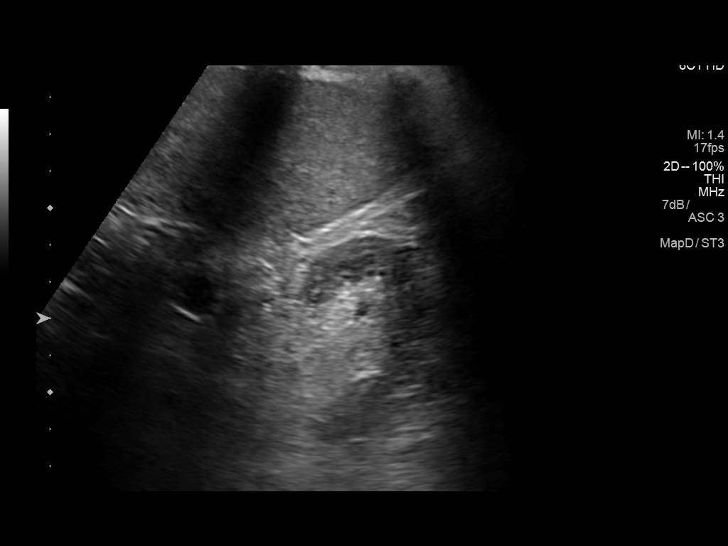
[im 113/124]
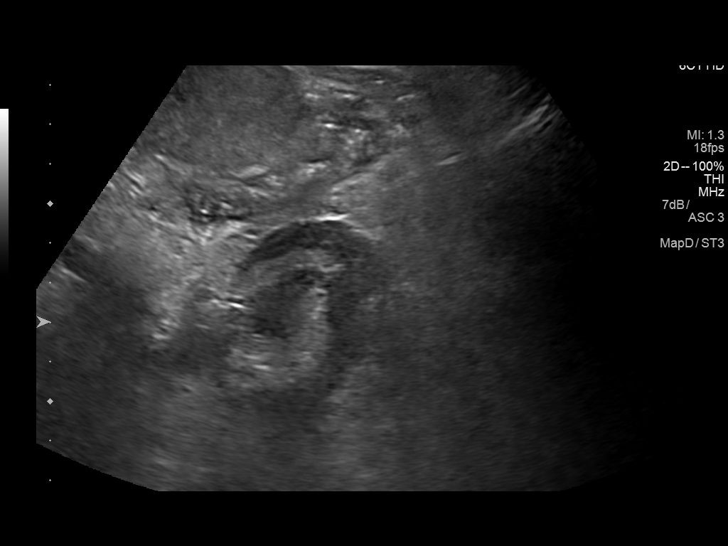
[im 124/124]
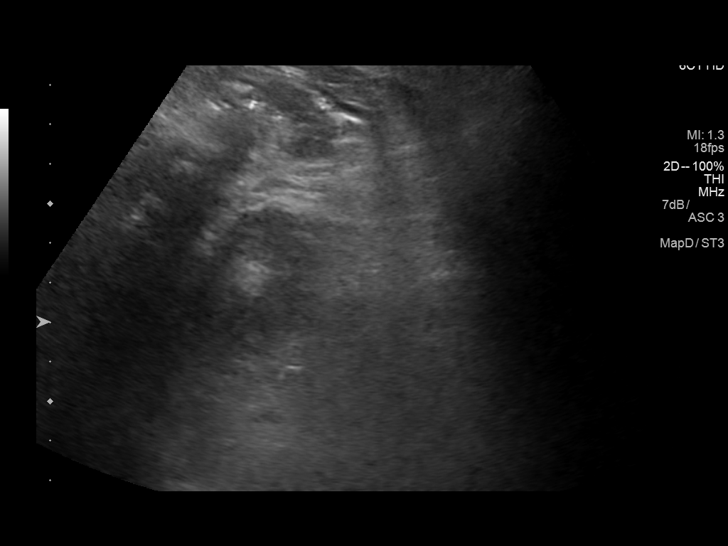

[14 of 25 positions shown; findings below may reference images not displayed]

FINDINGS: Right Kidney:

Length: 9 cm. Increased cortical echogenicity. No mass or
hydronephrosis.

Left Kidney:

Length: 8.5 cm. Mild prominence of the central left renal collecting
system as seen on image 26 without gross hydronephrosis. This was
not visualized previously.

Bladder:

The bladder is decompressed and poorly evaluated but grossly
unremarkable.
IMPRESSION: Mild prominence of the central left renal collecting system not seen
previously. No gross hydronephrosis or caliectasis. Recommend
clinical correlation.

## 2017-05-28 IMAGING — US US RENAL
1 series · 14 of 25 positions shown · non-contrast
Comparison: February 26, 2017

CLINICAL DATA: Recent left-sided caliectasis

EXAM:
RENAL / URINARY TRACT ULTRASOUND COMPLETE

[Series 1: us renal · 0.26mm/px · 14 of 27 slices shown]
[im 1/27]
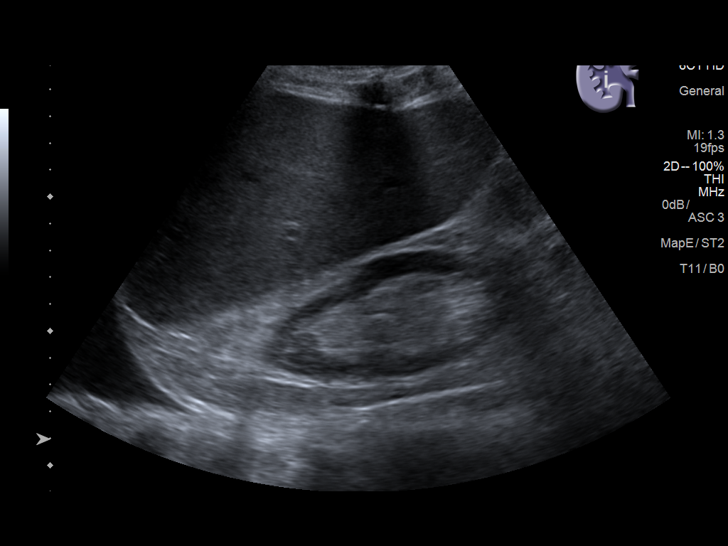
[im 3/27]
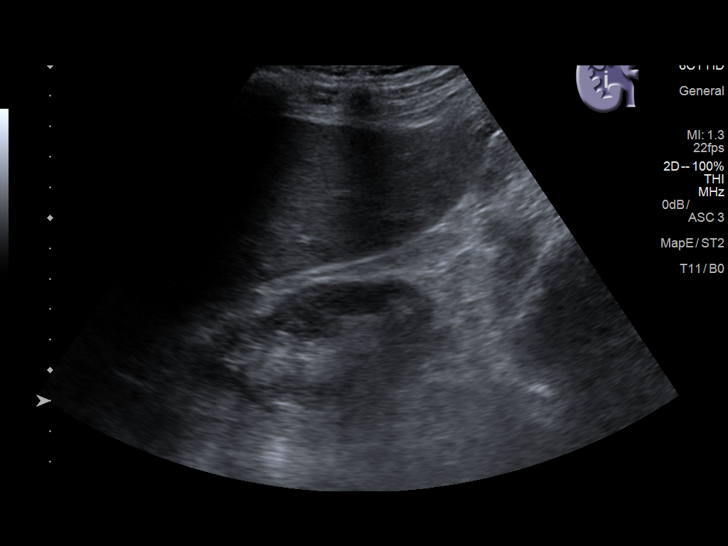
[im 5/27]
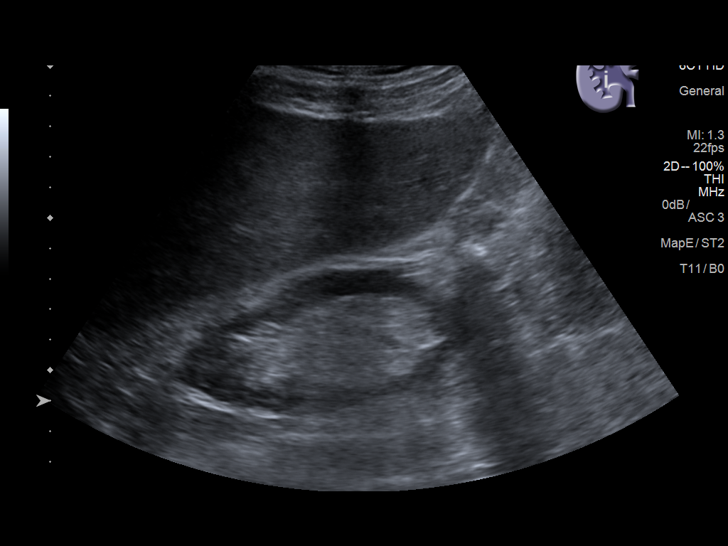
[im 7/27]
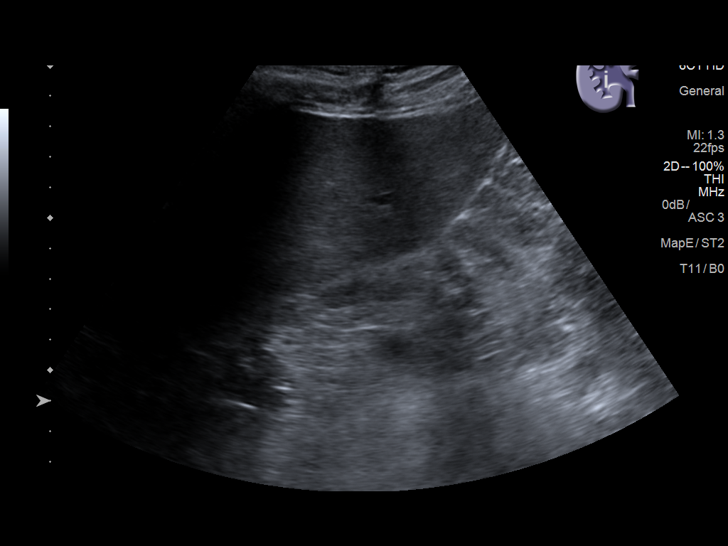
[im 9/27]
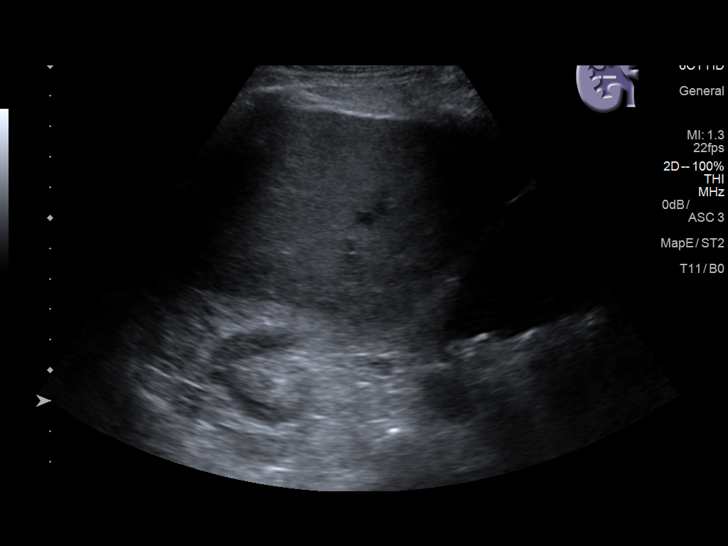
[im 10/27]
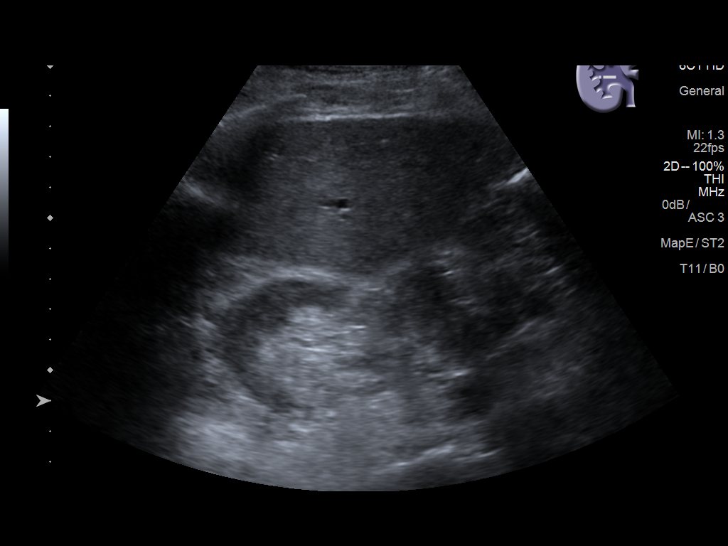
[im 12/27]
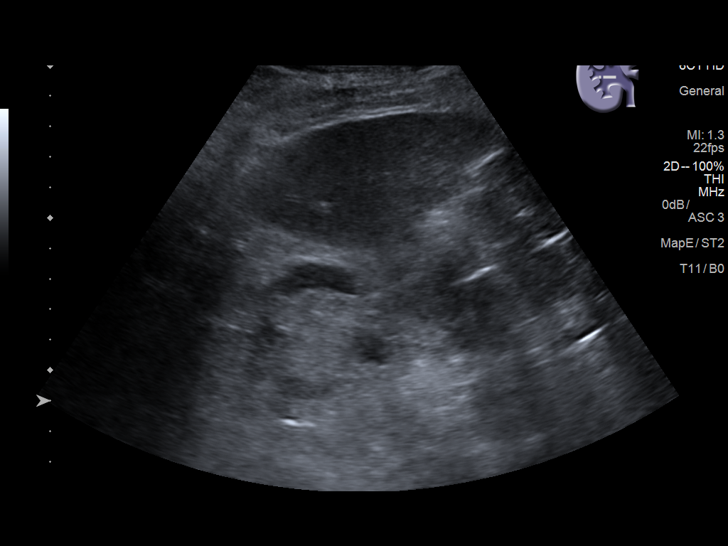
[im 15/27]
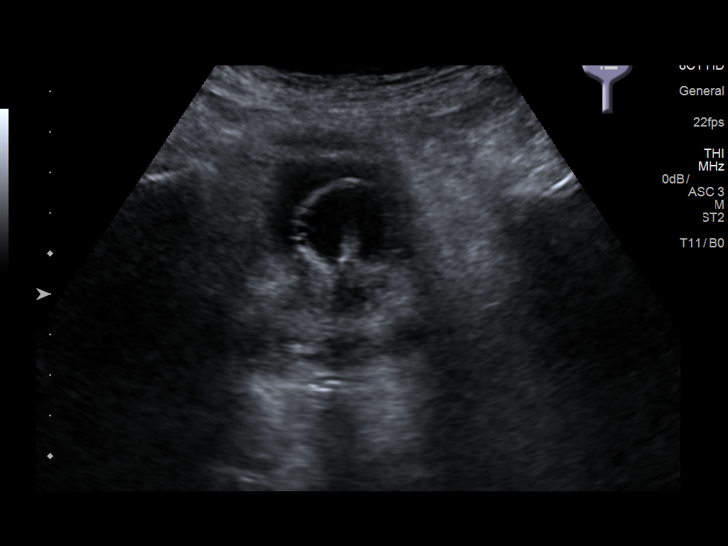
[im 17/27]
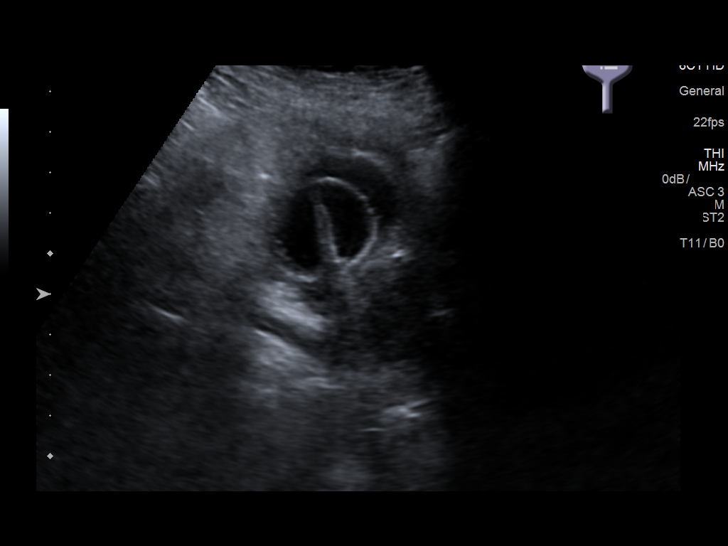
[im 18/27]
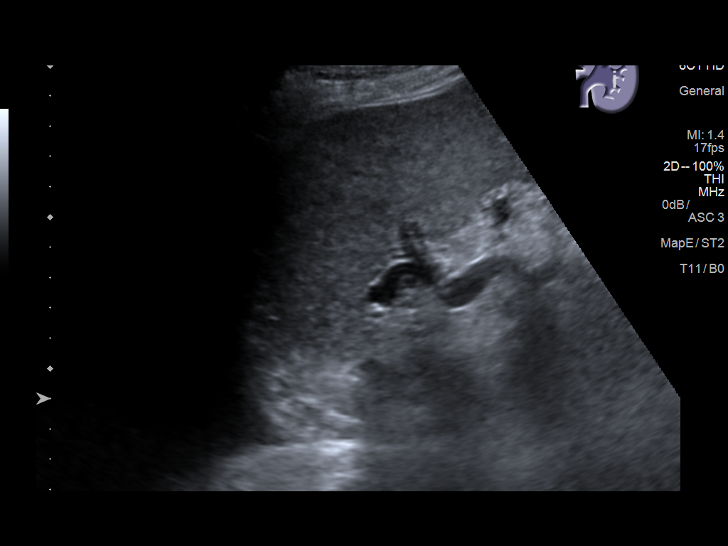
[im 20/27]
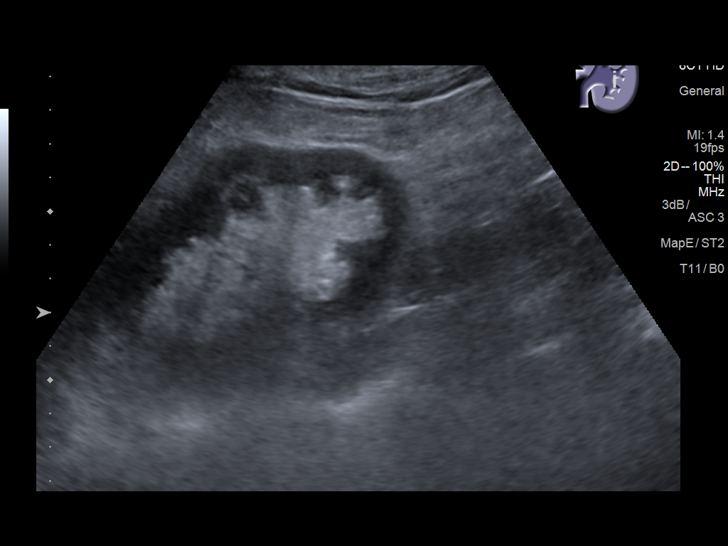
[im 22/27]
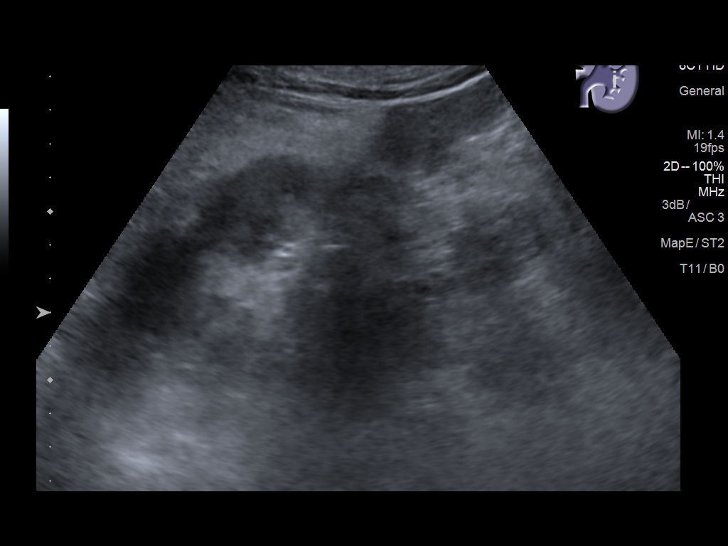
[im 24/27]
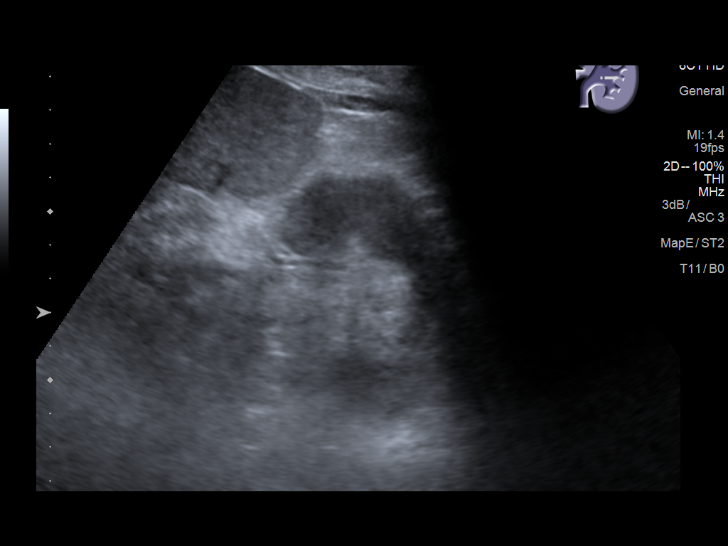
[im 27/27]
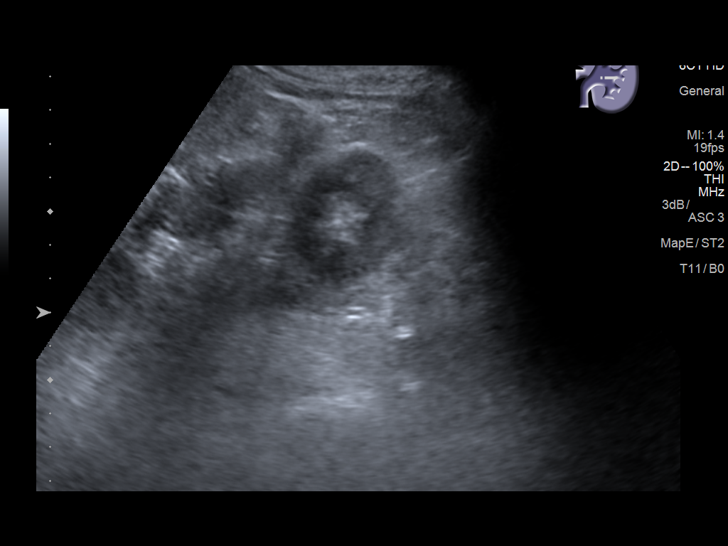

[14 of 25 positions shown; findings below may reference images not displayed]

FINDINGS: Right Kidney:

Length: 9.9 cm. Echogenicity within normal limits. There is renal
cortical thinning. No mass, perinephric fluid, or hydronephrosis
visualized. No sonographically demonstrable calculus or
ureterectasis.

Left Kidney:

Length: 10.0 cm. Echogenicity within normal limits. There is renal
cortical thinning. No mass, perinephric fluid, or hydronephrosis
visualized. There has been interval resolution of caliectasis on the
left. No sonographically demonstrable calculus or ureterectasis.

Bladder:

Appears normal for degree of bladder distention. A [place] catheter is
in place, although there is still modest urine within the bladder.

There are bilateral pleural effusions.
IMPRESSION: Interval resolution of caliectasis on the left. Currently no
obstructing focus in either kidney. There is renal cortical thinning
bilaterally, a finding that may be seen with either age or medical
renal disease. Note that the renal echogenicity is normal
bilaterally. No new lesion. Incidental note is made of bilateral
pleural effusions.

## 2017-06-03 DIAGNOSIS — N179 Acute kidney failure, unspecified: Secondary | ICD-10-CM | POA: Diagnosis not present

## 2017-06-03 DIAGNOSIS — N183 Chronic kidney disease, stage 3 (moderate): Secondary | ICD-10-CM | POA: Diagnosis not present

## 2017-06-03 LAB — CBC AND DIFFERENTIAL
HCT: 31 — AB (ref 41–53)
HEMOGLOBIN: 11 — AB (ref 13.5–17.5)
NEUTROS ABS: 3
PLATELETS: 105 — AB (ref 150–399)
WBC: 7.2

## 2017-06-03 LAB — BASIC METABOLIC PANEL
BUN: 51 — AB (ref 4–21)
CREATININE: 2.4 — AB (ref 0.6–1.3)
Glucose: 85
Potassium: 5.8 — AB (ref 3.4–5.3)
SODIUM: 137 (ref 137–147)

## 2017-06-16 ENCOUNTER — Other Ambulatory Visit: Payer: Self-pay | Admitting: *Deleted

## 2017-06-16 MED ORDER — NITROGLYCERIN 0.4 MG SL SUBL: 0.4000 mg | SUBLINGUAL_TABLET | SUBLINGUAL | 3 refills | Status: DC | PRN

## 2017-06-16 NOTE — Telephone Encounter (Signed)
Rx resent electronically to correct pharmacy

## 2017-06-17 ENCOUNTER — Other Ambulatory Visit: Payer: Self-pay | Admitting: Cardiology

## 2017-06-17 DIAGNOSIS — H401132 Primary open-angle glaucoma, bilateral, moderate stage: Secondary | ICD-10-CM | POA: Diagnosis not present

## 2017-06-17 NOTE — Telephone Encounter (Signed)
REFILL 

## 2017-06-18 ENCOUNTER — Telehealth: Payer: Self-pay | Admitting: Adult Health

## 2017-06-18 ENCOUNTER — Ambulatory Visit (INDEPENDENT_AMBULATORY_CARE_PROVIDER_SITE_OTHER): Payer: Medicare Other | Admitting: Adult Health

## 2017-06-18 ENCOUNTER — Encounter: Payer: Self-pay | Admitting: Adult Health

## 2017-06-18 VITALS — BP 138/82 | Temp 98.2°F | Ht 67.0 in | Wt 111.4 lb

## 2017-06-18 DIAGNOSIS — Z9861 Coronary angioplasty status: Secondary | ICD-10-CM

## 2017-06-18 DIAGNOSIS — Z7689 Persons encountering health services in other specified circumstances: Secondary | ICD-10-CM | POA: Diagnosis not present

## 2017-06-18 DIAGNOSIS — I2510 Atherosclerotic heart disease of native coronary artery without angina pectoris: Secondary | ICD-10-CM | POA: Diagnosis not present

## 2017-06-18 DIAGNOSIS — I10 Essential (primary) hypertension: Secondary | ICD-10-CM | POA: Diagnosis not present

## 2017-06-18 DIAGNOSIS — E43 Unspecified severe protein-calorie malnutrition: Secondary | ICD-10-CM | POA: Diagnosis not present

## 2017-06-18 NOTE — Telephone Encounter (Signed)
Pharmacy has been deleted from chart.

## 2017-06-18 NOTE — Patient Instructions (Signed)
It was great meeting you today.   Please follow up with me in one month of your physical. If you need anything in the meantime, please let me know

## 2017-06-18 NOTE — Progress Notes (Signed)
Patient presents to clinic today to establish care. 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   Acute Concerns: Establish Care   Chronic Issues: Essential Hypertension - takes Metoprolol XL 25 mg   CKD Stage 4 - is followed by Nephrology(?)  Health Maintenance: Dental -- Dentures   Vision -- Routine Care - Dr. Nicki Reaper  Immunizations -- UTD  Colonoscopy --2001 - No longer needs due to age  Diet: Has a good appetite.  Exercise: He stays active outside  Is followed by:   Nephrology - unknown physician  Cardiology - Dr. Dr. Johny Chess    Past Medical History:  Diagnosis Date  . Anemia of chronic renal failure, stage 4 (severe) ([place]) 11/27/2015  . 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 CAD S/P percutaneous coronary angioplasty - DES in RCA with PTCA for ISR; Moderate mLAD, 80% ostial OM2 stable 05/22/2008   Qualifier: Diagnosis of  By: Linda Hedges MD, Heinz Knuckles   . CKD (chronic kidney disease) stage 3, GFR 30-59 ml/min   . Essential hypertension 06/30/2007   Qualifier: Diagnosis of  By: Cori Razor RN, Mikal Plane    . H/O Inferior STEMI (09/2013) emergent PCI with Promus DES to RCA (3.0 mm  x 28 mm - 3.2 mm) 10/05/2013  . Left-sided carotid artery disease -- s/p CEA 04/06/2006   S/p Left CEA   . Mitral regurgitation - onexam 09/03/2016    Past Surgical History:    Procedure Laterality Date  . AMPUTATION     left index finger -tramatic  . CARDIAC CATHETERIZATION  November 2011   40% mid LAD, 50% proximal RCA, 30-40% distal RCA (DOMINANT RCA with wraparound PDA & 2 large PLBs), 60-70% ostial OM1. No change from 2001. Medical therapy  . CAROTID ENDARTERECTOMY Left 2007   Dr.Hayes: 08/2016 Dopplers: Stable less than 40% right internal carotid stenosis and stable moderate 40-60% left internal carotid stenosis. Patent vertebral and subclavian arteries.  . CARPAL TUNNEL RELEASE    . CORNEAL TRANSPLANT    . CORONARY ANGIOPLASTY WITH STENT PLACEMENT  09/2013; 05/2014    d) Promus DES 3.0 mm x 28 mm (3.2 mm)  to RCA with STEMI; residual 70% mid-distal LAD, OM 270-80%. Medical management; b) PTCA of 75 % ISR , LAD lesion noted as ~40%  . FOOT SURGERY     left  . HERNIA REPAIR    . LEFT HEART CATHETERIZATION WITH CORONARY ANGIOGRAM N/A 10/05/2013   Procedure: LEFT HEART CATHETERIZATION WITH CORONARY ANGIOGRAM;  Surgeon: Peter M Martinique, MD;  Location: [facility] CATH LAB;  Service: Cardiovascular;  RCA 100% - PCI,CxOM stable, LAD ~70% mid;  . LEFT HEART CATHETERIZATION WITH CORONARY ANGIOGRAM N/A 06/08/2014   Procedure: LEFT HEART CATHETERIZATION WITH CORONARY ANGIOGRAM;  Surgeon: Troy Sine, MD;  Location: [facility] CATH LAB;  Service: Cardiovascular;  UA 6/'15: 75% ISR in RCA - PTCA, LAD lesion ~40%, CxOM stable.    Marland Kitchen  PARTIAL GASTRECTOMY    . PERCUTANEOUS CORONARY INTERVENTION-BALLOON ONLY  06/08/2014   Procedure: PERCUTANEOUS CORONARY INTERVENTION-BALLOON ONLY;  Surgeon: Troy Sine, MD;  Location: [facility] CATH LAB;  Service: Cardiovascular;;  . ROTATOR CUFF REPAIR     left  . SHOULDER ARTHROSCOPY WITH ROTATOR CUFF REPAIR AND SUBACROMIAL DECOMPRESSION Right 07/01/2013   Procedure: RIGHT SHOULDER ARTHROSCOPY WITH  SUBACROMIAL DECOMPRESSION/DISTAL CLAVICLE RESECTION/ROTATOR CUFF REPAIR ;  Surgeon: Marin Shutter, MD;  Location: [place];  Service: Orthopedics;  Laterality: Right;   . SPINE SURGERY  1985   cervical laminectomy  . TOTAL KNEE ARTHROPLASTY    . TRANSTHORACIC ECHOCARDIOGRAM  09/'17; 4/'18   a) Normal EF 55-60%. Mild to moderate aortic stenosis with moderate regurgitation. Mild MR.;; b) Mild global reduction in LV systolic function: 92-92%; grade 1 DD - high LVEDP. Mild AS/AI/TR. Mild-Mod MR; moderately elevated PAP.  Marland Kitchen VAGOTOMY     partial gastrectomy    Current Outpatient Prescriptions on File Prior to Visit  Medication Sig Dispense Refill  . aspirin EC 81 MG tablet Take 1 tablet (81 mg total) by mouth daily. 90 tablet 3  . brinzolamide (AZOPT) 1 % ophthalmic suspension Place 1 drop into the left eye every 12 (twelve) hours.     . metoprolol succinate (TOPROL XL) 25 MG 24 hr tablet Take 1 tablet (25 mg total) by mouth daily. 30 tablet 11  . nitroGLYCERIN (NITROSTAT) 0.4 MG SL tablet DISSOLVE 1 TABLET UNDER THE TONGUE EVERY 5 MINUTES AS NEEDED FOR CHEST PAIN 275 tablet 1  . TRAVATAN Z 0.004 % SOLN ophthalmic solution Place 1 drop into both eyes at bedtime.      No current facility-administered medications on file prior to visit.     Allergies  Allergen Reactions  . Amlodipine Besylate Hives  . Hydralazine Itching  . Lipitor [Atorvastatin] Itching  . Naproxen Diarrhea  . Oxycodone-Acetaminophen Itching  . Isosorbide Rash    Family History  Problem Relation Age of Onset  . Heart disease Sister   . Diabetes Sister     Social History   Social History  . Marital status: Married    Spouse name: N/A  . Number of children: N/A  . Years of education: N/A   Occupational History  .  Retired    Retired   Social History Main Topics  . Smoking status: Former Smoker    Types: Cigarettes    Quit date: 12/23/1964  . Smokeless tobacco: Never Used  . Alcohol use No  . Drug use: No  . Sexual activity: Not on file   Other Topics Concern  . Not on file   Social History Narrative   He is married with 2 children. He lives with his wife.   Very  active at home, prior to a STEMI, he could walk several miles without discomfort. Prior to his MI, he would use his push mower to mow his entire lawn.   He is a former smoker and quit back in 1966. He does not drink alcohol    Review of Systems  Constitutional: Negative.   Respiratory: Negative.   Cardiovascular: Negative.   Genitourinary: Negative.   Musculoskeletal: Negative.   Neurological: Negative.   Psychiatric/Behavioral: Negative.   All other systems reviewed and are negative.   BP 138/82 (BP Location: Left Arm, Patient Position: Sitting, Cuff Size: Normal)   Temp 98.2 F (36.8 C) (Oral)   Ht 5\' 7"  (1.702 m)   Wt 111 lb 6.4 oz (50.5 kg)  BMI 17.45 kg/m   Physical Exam  Constitutional: He is oriented to person, place, and time and well-developed, well-nourished, and in no distress. No distress.  Neck: Normal range of motion. Neck supple. No thyromegaly present.  Cardiovascular: Normal rate, regular rhythm, normal heart sounds and intact distal pulses.  Exam reveals no gallop and no friction rub.   No murmur heard. Pulmonary/Chest: Effort normal and breath sounds normal. No respiratory distress. He has no wheezes. He has no rales. He exhibits no tenderness.  Lymphadenopathy:    He has no cervical adenopathy.  Neurological: He is alert and oriented to person, place, and time. Gait normal. GCS score is 15.  Skin: Skin is warm and dry. No rash noted. He is not diaphoretic. No erythema. No pallor.  Psychiatric: Mood, memory, affect and judgment normal.  Nursing note and vitals reviewed.   Assessment/Plan: - He can follow up with me for his yearly follow up exam. His medications are managed by Cardiology at this point. Advised to increase calories throughout the day to put on weight, we have to be careful about high protein diets because of kidney disease. Follow up as needed for any acute issues.   Dorothyann Peng, NP

## 2017-06-18 NOTE — Telephone Encounter (Signed)
° ° ° ° °  Pt said  [facility] need to be delted he doesn't use that pharmacy

## 2017-06-24 ENCOUNTER — Telehealth: Payer: Self-pay | Admitting: Cardiology

## 2017-06-24 NOTE — Telephone Encounter (Signed)
Pt c/o medication issue:  1. Name of Medication: metoprolol   2. How are you currently taking this medication (dosage and times per day)? 25mg  1xday  3. Are you having a reaction (difficulty breathing--STAT)? no  4. What is your medication issue? Itching all over body

## 2017-06-24 NOTE — Telephone Encounter (Signed)
Spoke to patient's wife . Wife states when patient stop taking metoprolol  The itching stopped and patient challenge the medication x 2  Each time patient became icthing.  Patient stopped medication last week - patient does not know what day .  informed patient's wife  to continue to hold medication will defer to Dr Ellyn Hack. Wife verbalized understanding

## 2017-06-26 NOTE — Telephone Encounter (Signed)
Agree - just stop Toprol for now -we will need to think of something else.  Maybe we can have him come in to see me or an APP in the next week or 2 to figure out an alternative.  Glenetta Hew

## 2017-06-27 NOTE — Telephone Encounter (Signed)
Spoke to patient . Aware not restart medication  appointment schedule for 07/14/17 to follow up othrer options with extender. Patient verbalized understanding.

## 2017-07-04 DIAGNOSIS — N183 Chronic kidney disease, stage 3 (moderate): Secondary | ICD-10-CM | POA: Diagnosis not present

## 2017-07-04 LAB — BASIC METABOLIC PANEL: GLUCOSE: 94

## 2017-07-14 ENCOUNTER — Encounter: Payer: Self-pay | Admitting: Physician Assistant

## 2017-07-14 ENCOUNTER — Ambulatory Visit (INDEPENDENT_AMBULATORY_CARE_PROVIDER_SITE_OTHER): Payer: Medicare Other | Admitting: Physician Assistant

## 2017-07-14 VITALS — BP 130/60 | HR 62 | Ht 67.0 in | Wt 110.0 lb

## 2017-07-14 DIAGNOSIS — I10 Essential (primary) hypertension: Secondary | ICD-10-CM

## 2017-07-14 DIAGNOSIS — Z79899 Other long term (current) drug therapy: Secondary | ICD-10-CM | POA: Diagnosis not present

## 2017-07-14 DIAGNOSIS — I2510 Atherosclerotic heart disease of native coronary artery without angina pectoris: Secondary | ICD-10-CM | POA: Diagnosis not present

## 2017-07-14 DIAGNOSIS — N183 Chronic kidney disease, stage 3 unspecified: Secondary | ICD-10-CM

## 2017-07-14 DIAGNOSIS — I779 Disorder of arteries and arterioles, unspecified: Secondary | ICD-10-CM | POA: Diagnosis not present

## 2017-07-14 DIAGNOSIS — R6883 Chills (without fever): Secondary | ICD-10-CM

## 2017-07-14 DIAGNOSIS — R634 Abnormal weight loss: Secondary | ICD-10-CM | POA: Diagnosis not present

## 2017-07-14 DIAGNOSIS — L299 Pruritus, unspecified: Secondary | ICD-10-CM

## 2017-07-14 DIAGNOSIS — I739 Peripheral vascular disease, unspecified: Secondary | ICD-10-CM

## 2017-07-14 DIAGNOSIS — Z9861 Coronary angioplasty status: Secondary | ICD-10-CM

## 2017-07-14 LAB — COMPREHENSIVE METABOLIC PANEL
A/G RATIO: 2.2 (ref 1.2–2.2)
ALT: 16 IU/L (ref 0–44)
AST: 20 IU/L (ref 0–40)
Albumin: 4.6 g/dL (ref 3.5–4.7)
Alkaline Phosphatase: 81 IU/L (ref 39–117)
BUN / CREAT RATIO: 28 — AB (ref 10–24)
BUN: 64 mg/dL — ABNORMAL HIGH (ref 8–27)
Bilirubin Total: 0.4 mg/dL (ref 0.0–1.2)
CALCIUM: 9.3 mg/dL (ref 8.6–10.2)
CO2: 14 mmol/L — ABNORMAL LOW (ref 20–29)
Chloride: 115 mmol/L — ABNORMAL HIGH (ref 96–106)
Creatinine, Ser: 2.31 mg/dL — ABNORMAL HIGH (ref 0.76–1.27)
GFR, EST AFRICAN AMERICAN: 29 mL/min/{1.73_m2} — AB (ref >59)
GFR, EST NON AFRICAN AMERICAN: 25 mL/min/{1.73_m2} — AB (ref >59)
GLOBULIN, TOTAL: 2.1 g/dL (ref 1.5–4.5)
Glucose: 86 mg/dL (ref 65–99)
POTASSIUM: 5.8 mmol/L — AB (ref 3.5–5.2)
SODIUM: 142 mmol/L (ref 134–144)
TOTAL PROTEIN: 6.7 g/dL (ref 6.0–8.5)

## 2017-07-14 LAB — TSH: TSH: 4.45 u[IU]/mL (ref 0.450–4.500)

## 2017-07-14 NOTE — Progress Notes (Addendum)
Cardiology Office Note    Date:  07/15/2017   ID:  Kano Heckmann, DOB 01-08-1930, MRN [id number]  PCP:  Dorothyann Peng, NP  Cardiologist:  Dr. Ellyn Hack Primary nephrologist: Dr. Justin Mend  Chief Complaint  Patient presents with  . Follow-up    Seen for Dr. Ellyn Hack. No complaints besides the fact that he has lost so much weight.    History of Present Illness:  Mehar Kirkwood is a 83 y.o. male with PMH of CAD, h/o partial gastrectomy with Bilroth I gastroenterostomy and blind loop syndrome, CKD stage III, HTN, carotid artery disease s/p L CEA. He had a history of inferior STEMI in October 2014 with emergent PCI was Promus DES to RCA. He underwent CEA in April 2015. He later developed unstable angina in June 2015 and found to have in-stent restenosis treated with cutting balloon angioplasty, he had 80% stable ostial OM 2 lesion. Subsequent Myoview in September showed no ischemia or infarction. He was seen in April 2018 at which time he complained of dyspnea on exertion. Subsequent Myoview obtained on 03/27/2017 showed EF 51%, low risk study. 2-D echo obtained on 03/30/2017 showed EF 45-50%, grade 1 DD, mild AI/TR, mild to moderate MR. Unfortunately, he was later admitted in April with severe sepsis and Klebsiella UTI. He was severely dehydrated on initial presentation, after aggressive hydration, he later developed volume overload requiring diuresis. Due to severe anemia, his Plavix was stopped and he was instructed to restart aspirin a month later. During the last office visit, he was resumed on Toprol-XL, however based on phone conversation on 06/24/2017, each time he restarted on the Toprol-XL, he was having rash with itching.  He presents today for cardiology office evaluation. During the visit, he continued to scratch under his left armpit. He is accompanied by his wife. I did not see any obvious rash in the area. Review of previous allergies indicate he had rash to multiple medications. Interestingly,  he has been on Toprol-XL for quite some time and only developed a rash recently. I am not convinced he truly have allergic reaction to the medication. He has very thin flaky skins on physical exam. I recommended him to continue current medication for now. I did order a complete metabolic panel to make sure his electrolyte and liver enzyme is normal. He says he is cold all the time and have frequent chills. I did order a TSH as well. He states that in the last year, he has went from 140 pounds down to 110 pounds. When I checked his previous office records, it appears he was 133 pounds in August 2016. He denies any history of cancer. His wife mentions because he has a history of Bilroth I procedure, he eats in small amounts frequently. I asked him to discuss this with his primary care physician to see if there is any way to increase his appetite. Otherwise, he has been doing well from cardiology perspective, he denies any significant chest discomfort, shortness of breath or dizziness. He has no sign of heart failure symptom on physical exam.    Past Medical History:  Diagnosis Date  . Anemia of chronic renal failure, stage 4 (severe) ([place]) 11/27/2015  . 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 CAD S/P percutaneous coronary angioplasty - DES in RCA with PTCA for ISR; Moderate mLAD, 80% ostial OM2 stable 05/22/2008   Qualifier: Diagnosis of  By: Linda Hedges MD, Heinz Knuckles   .  CKD (chronic kidney disease) stage 3, GFR 30-59 ml/min   . Essential hypertension 06/30/2007   Qualifier: Diagnosis of  By: Cori Razor RN, Mikal Plane    . H/O Inferior STEMI (09/2013) emergent PCI with Promus DES to RCA (3.0 mm  x 28 mm - 3.2 mm) 10/05/2013  . Left-sided carotid artery disease -- s/p CEA 04/06/2006   S/p Left CEA   . Mitral regurgitation - onexam 09/03/2016    Past Surgical History:  Procedure Laterality Date  . AMPUTATION     left index  finger -tramatic  . CARDIAC CATHETERIZATION  November 2011   40% mid LAD, 50% proximal RCA, 30-40% distal RCA (DOMINANT RCA with wraparound PDA & 2 large PLBs), 60-70% ostial OM1. No change from 2001. Medical therapy  . CAROTID ENDARTERECTOMY Left 2007   Dr.Hayes: 08/2016 Dopplers: Stable less than 40% right internal carotid stenosis and stable moderate 40-60% left internal carotid stenosis. Patent vertebral and subclavian arteries.  . CARPAL TUNNEL RELEASE    . CORNEAL TRANSPLANT    . CORONARY ANGIOPLASTY WITH STENT PLACEMENT  09/2013; 05/2014    d) Promus DES 3.0 mm x 28 mm (3.2 mm)  to RCA with STEMI; residual 70% mid-distal LAD, OM 270-80%. Medical management; b) PTCA of 75 % ISR , LAD lesion noted as ~40%  . FOOT SURGERY     left  . HERNIA REPAIR    . LEFT HEART CATHETERIZATION WITH CORONARY ANGIOGRAM N/A 10/05/2013   Procedure: LEFT HEART CATHETERIZATION WITH CORONARY ANGIOGRAM;  Surgeon: Peter M Martinique, MD;  Location: [facility] CATH LAB;  Service: Cardiovascular;  RCA 100% - PCI,CxOM stable, LAD ~70% mid;  . LEFT HEART CATHETERIZATION WITH CORONARY ANGIOGRAM N/A 06/08/2014   Procedure: LEFT HEART CATHETERIZATION WITH CORONARY ANGIOGRAM;  Surgeon: Troy Sine, MD;  Location: [facility] CATH LAB;  Service: Cardiovascular;  UA 6/'15: 75% ISR in RCA - PTCA, LAD lesion ~40%, CxOM stable.    Marland Kitchen PARTIAL GASTRECTOMY    . PERCUTANEOUS CORONARY INTERVENTION-BALLOON ONLY  06/08/2014   Procedure: PERCUTANEOUS CORONARY INTERVENTION-BALLOON ONLY;  Surgeon: Troy Sine, MD;  Location: [facility] CATH LAB;  Service: Cardiovascular;;  . ROTATOR CUFF REPAIR     left  . SHOULDER ARTHROSCOPY WITH ROTATOR CUFF REPAIR AND SUBACROMIAL DECOMPRESSION Right 07/01/2013   Procedure: RIGHT SHOULDER ARTHROSCOPY WITH  SUBACROMIAL DECOMPRESSION/DISTAL CLAVICLE RESECTION/ROTATOR CUFF REPAIR ;  Surgeon: Marin Shutter, MD;  Location: [place];  Service: Orthopedics;  Laterality: Right;  . SPINE SURGERY  1985   cervical laminectomy  . TOTAL  KNEE ARTHROPLASTY    . TRANSTHORACIC ECHOCARDIOGRAM  09/'17; 4/'18   a) Normal EF 55-60%. Mild to moderate aortic stenosis with moderate regurgitation. Mild MR.;; b) Mild global reduction in LV systolic function: 16-01%; grade 1 DD - high LVEDP. Mild AS/AI/TR. Mild-Mod MR; moderately elevated PAP.  Marland Kitchen VAGOTOMY     partial gastrectomy    Current Medications: Outpatient Medications Prior to Visit  Medication Sig Dispense Refill  . aspirin EC 81 MG tablet Take 1 tablet (81 mg total) by mouth daily. 90 tablet 3  . brinzolamide (AZOPT) 1 % ophthalmic suspension Place 1 drop into the left eye every 12 (twelve) hours.     . metoprolol succinate (TOPROL XL) 25 MG 24 hr tablet Take 1 tablet (25 mg total) by mouth daily. (Patient not taking: Reported on 07/15/2017) 30 tablet 11  . nitroGLYCERIN (NITROSTAT) 0.4 MG SL tablet DISSOLVE 1 TABLET UNDER THE TONGUE EVERY 5 MINUTES AS NEEDED FOR CHEST PAIN 275  tablet 1  . TRAVATAN Z 0.004 % SOLN ophthalmic solution Place 1 drop into both eyes at bedtime.      No facility-administered medications prior to visit.      Allergies:   Amlodipine besylate; Hydralazine; Lipitor [atorvastatin]; Metoprolol; Naproxen; Oxycodone-acetaminophen; and Isosorbide   Social History   Social History  . Marital status: Married    Spouse name: N/A  . Number of children: N/A  . Years of education: N/A   Occupational History  .  Retired    Retired   Social History Main Topics  . Smoking status: Former Smoker    Types: Cigarettes    Quit date: 12/23/1964  . Smokeless tobacco: Never Used  . Alcohol use No  . Drug use: No  . Sexual activity: Not Asked   Other Topics Concern  . None   Social History Narrative   He is married with 2 children. He lives with his wife.   Very active at home, prior to a STEMI, he could walk several miles without discomfort. Prior to his MI, he would use his push mower to mow his entire lawn.   He is a former smoker and quit back in 1966. He  does not drink alcohol     Family History:  The patient's family history includes Diabetes in his sister; Heart disease in his sister.   ROS:   Please see the history of present illness.    ROS All other systems reviewed and are negative.   PHYSICAL EXAM:   VS:  BP 130/60   Pulse 62   Ht 5\' 7"  (1.702 m)   Wt 110 lb (49.9 kg)   BMI 17.23 kg/m    GEN: Well nourished, well developed, in no acute distress  HEENT: normal  Neck: no JVD, carotid bruits, or masses Cardiac: RRR; no murmurs, rubs, or gallops,no edema  Respiratory:  clear to auscultation bilaterally, normal work of breathing GI: soft, nontender, nondistended, + BS MS: no deformity or atrophy  Skin: warm and dry, no rash Neuro:  Alert and Oriented x 3, Strength and sensation are intact Psych: euthymic mood, full affect  Wt Readings from Last 3 Encounters:  07/15/17 110 lb 12.8 oz (50.3 kg)  07/14/17 110 lb (49.9 kg)  06/18/17 111 lb 6.4 oz (50.5 kg)      Studies/Labs Reviewed:   EKG:  EKG is not ordered today.    Recent Labs: 04/04/2017: Magnesium 1.9 07/14/2017: ALT 16; BUN 64; Creatinine, Ser 2.31; Potassium 5.8; Sodium 142; TSH 4.450 07/15/2017: Hemoglobin 11.2; Platelets 106.0   Lipid Panel    Component Value Date/Time   CHOL 122 07/20/2015 1033   TRIG 106.0 07/20/2015 1033   TRIG 91 10/07/2006 1055   HDL 37.90 (L) 07/20/2015 1033   CHOLHDL 3 07/20/2015 1033   VLDL 21.2 07/20/2015 1033   LDLCALC 63 07/20/2015 1033    Additional studies/ records that were reviewed today include:    Carotid US 09/18/2016 Heterogeneous plaque, bilaterally. Stable 1-39% RICA stenosis. Patent left CEA with a 36-14% LICA stenosis. Patent vertebral arteries with antegrade flow.  Normal subclavian arteries, bilaterally.    Echo 03/30/2017 LV EF: 45% -   50%  Study Conclusions  - Left ventricle: The cavity size was normal. Wall thickness was   normal. Systolic function was mildly reduced. The estimated   ejection  fraction was in the range of 45% to 50%. Diffuse   hypokinesis. Doppler parameters are consistent with abnormal left   ventricular relaxation (grade  1 diastolic dysfunction). Doppler   parameters are consistent with high ventricular filling pressure. - Aortic valve: Valve mobility was restricted. There was mild   stenosis. There was mild regurgitation. Valve area (VTI): 1.58   cm^2. Valve area (Vmax): 1.5 cm^2. Valve area (Vmean): 1.41 cm^2. - Mitral valve: Calcified annulus. There was mild to moderate   regurgitation. - Pulmonary arteries: Systolic pressure was moderately increased.   PA peak pressure: 59 mm Hg (S).  Impressions:  - Mild global reduction in LV systolic function; grade 1 diastolic   dysfunction; elevated LV filling pressure; mild AS and AI; mild   to moderate MR; mild TR; moderately elevated pulmonary pressure.    Myoview 03/27/2017 Study Highlights     The left ventricular ejection fraction is mildly decreased (45-54%).  Nuclear stress EF: 51%.  No T wave inversion was noted during stress.  There was no ST segment deviation noted during stress.  This is a low risk study.   Normal perfusion. LVEF 51% with normal wall motion. This is a low risk study.     ASSESSMENT:    1. Itchy skin   2. Encounter for long-term (current) use of medications   3. Coronary artery disease involving native coronary artery of native heart without angina pectoris   4. Weight loss   5. CKD (chronic kidney disease), stage III   6. Essential hypertension   7. Bilateral carotid artery disease ([place])   8. Chill      PLAN:  In order of problems listed above:  1. Itchy skin: I am not convinced her pruritis is related to Toprol-XL, she has been taking Toprol-XL for long time and her intermittent itchiness only occurred recently. Furthermore on physical exam, I do not see the rash. I recommend continue on current medication. He has multiple medications listed in the past with  reaction as itchiness or rash. His wife seems to think he is just nervous. I did not see any obvious jaundice on physical exam to suggest liver issue, however I would recommend a complete metabolic panel to rule out electrolyte imbalance and liver dysfunction.  2. Chill: He says he always feels cold, his last TSH was in 2016, I will repeat a TSH.  3. CAD: No obvious angina on physical exam.  4. Weight loss: Due to history of Billroth I procedure, he eats small portion multiple times throughout the day. He has says he hasn't been having too much of appetite recently which may explain his weight loss. He denies any known prior history of cancer. I did ask him to discuss this with his primary care physician.  5. CKD stage III: Renal function stable. Pending complete metabolic panel today.  6. Hypertension: Blood pressure stable 130/60. Continue Toprol-XL 25 mg daily  7. Carotid artery disease: Last carotid ultrasound in September 2017, 1-39% right ICA stenosis, 40-59% left ICA stenosis    Medication Adjustments/Labs and Tests Ordered: Current medicines are reviewed at length with the patient today.  Concerns regarding medicines are outlined above.  Medication changes, Labs and Tests ordered today are listed in the Patient Instructions below. Patient Instructions  Medication Instructions:   No changes to current regimen.   Labwork:   CMET and TSH today   Testing/Procedures:  None   Follow-Up:  With Dr. Ellyn Hack in about 4 months (we will send a letter in the mail when it is time to schedule)   If you need a refill on your cardiac medications before your next appointment, please  call your pharmacy.      Hilbert Corrigan, [place]  07/15/2017 6:06 PM    [facility] [place], [place],   [address] Phone: [phone]; Fax: [phone]

## 2017-07-14 NOTE — Patient Instructions (Addendum)
Medication Instructions:   No changes to current regimen.   Labwork:   CMET and TSH today   Testing/Procedures:  None   Follow-Up:  With Dr. Ellyn Hack in about 4 months (we will send a letter in the mail when it is time to schedule)   If you need a refill on your cardiac medications before your next appointment, please call your pharmacy.

## 2017-07-15 ENCOUNTER — Encounter: Payer: Self-pay | Admitting: Physician Assistant

## 2017-07-15 ENCOUNTER — Ambulatory Visit (INDEPENDENT_AMBULATORY_CARE_PROVIDER_SITE_OTHER): Payer: Medicare Other | Admitting: Adult Health

## 2017-07-15 ENCOUNTER — Encounter: Payer: Self-pay | Admitting: Adult Health

## 2017-07-15 ENCOUNTER — Ambulatory Visit (INDEPENDENT_AMBULATORY_CARE_PROVIDER_SITE_OTHER)
Admission: RE | Admit: 2017-07-15 | Discharge: 2017-07-15 | Disposition: A | Discharge status: Home / Self Care | Payer: Medicare Other | Source: Ambulatory Visit | Attending: Adult Health | Admitting: Adult Health

## 2017-07-15 VITALS — BP 150/68 | HR 100 | Temp 97.6°F | Ht 67.0 in | Wt 110.8 lb

## 2017-07-15 DIAGNOSIS — E440 Moderate protein-calorie malnutrition: Secondary | ICD-10-CM

## 2017-07-15 DIAGNOSIS — I10 Essential (primary) hypertension: Secondary | ICD-10-CM

## 2017-07-15 DIAGNOSIS — I2510 Atherosclerotic heart disease of native coronary artery without angina pectoris: Secondary | ICD-10-CM | POA: Diagnosis not present

## 2017-07-15 DIAGNOSIS — R419 Unspecified symptoms and signs involving cognitive functions and awareness: Secondary | ICD-10-CM | POA: Diagnosis not present

## 2017-07-15 DIAGNOSIS — R634 Abnormal weight loss: Secondary | ICD-10-CM | POA: Diagnosis not present

## 2017-07-15 DIAGNOSIS — Z9861 Coronary angioplasty status: Secondary | ICD-10-CM

## 2017-07-15 LAB — CBC WITH DIFFERENTIAL/PLATELET
Basophils Absolute: 0 10*3/uL (ref 0.0–0.1)
Basophils Relative: 0.4 % (ref 0.0–3.0)
EOS PCT: 5.3 % — AB (ref 0.0–5.0)
Eosinophils Absolute: 0.3 10*3/uL (ref 0.0–0.7)
HCT: 33 % — ABNORMAL LOW (ref 39.0–52.0)
HEMOGLOBIN: 11.2 g/dL — AB (ref 13.0–17.0)
LYMPHS ABS: 2.1 10*3/uL (ref 0.7–4.0)
Lymphocytes Relative: 39.7 % (ref 12.0–46.0)
MCHC: 33.8 g/dL (ref 30.0–36.0)
MCV: 99.2 fl (ref 78.0–100.0)
MONOS PCT: 9.6 % (ref 3.0–12.0)
Monocytes Absolute: 0.5 10*3/uL (ref 0.1–1.0)
NEUTROS PCT: 45 % (ref 43.0–77.0)
Neutro Abs: 2.3 10*3/uL (ref 1.4–7.7)
Platelets: 106 10*3/uL — ABNORMAL LOW (ref 150.0–400.0)
RBC: 3.33 Mil/uL — AB (ref 4.22–5.81)
RDW: 15.9 % — ABNORMAL HIGH (ref 11.5–15.5)
WBC: 5.2 10*3/uL (ref 4.0–10.5)

## 2017-07-15 LAB — C-REACTIVE PROTEIN: CRP: 0.1 mg/dL — AB (ref 0.5–20.0)

## 2017-07-15 LAB — SEDIMENTATION RATE: Sed Rate: 6 mm/hr (ref 0–20)

## 2017-07-15 NOTE — Progress Notes (Signed)
Kidney function stable, liver function normal. Thyroid level normal.

## 2017-07-15 NOTE — Patient Instructions (Signed)
It was great seeing you today   Cardiology got blood work on you yesterday  I would like to get a chest x ray on you to see if we can find out why you are losing weight   The test I did on your memory was normal

## 2017-07-15 NOTE — Progress Notes (Signed)
Subjective:    Patient ID: Nicholas Porter, male    DOB: [birth year], 83 y.o.   MRN: [id number]  HPI  Patient presents for yearly preventative medicine examination. 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   All immunizations and health maintenance protocols were reviewed with the patient and needed orders were placed.  Appropriate screening laboratory values were ordered for the patient including screening of hyperlipidemia, renal function and hepatic function. If indicated by BPH, a PSA was ordered.  Medication reconciliation,  past medical history, social history, problem list and allergies were reviewed in detail with the patient  Goals were established with regard to weight loss, exercise, and  diet in compliance with medications Wt Readings from Last 3 Encounters:  07/15/17 110 lb 12.8 oz (50.3 kg)  07/14/17 110 lb (49.9 kg)  06/18/17 111 lb 6.4 oz (50.5 kg)    End of life planning was discussed. He does not have a Healthcare power of attorney or living will. He does not want information on it   He is seen by Cardiology due to history CAD s/p L CEA, HTN, and inferior stemi in October 2014. Metoprolol was recently stopped due to rash. Had CMP and TSH done yesterday     Chemistry      Component Value Date/Time   NA 142 07/14/2017 1018   K 5.8 (H) 07/14/2017 1018   CL 115 (H) 07/14/2017 1018   CO2 14 (L) 07/14/2017 1018   BUN 64 (H)  07/14/2017 1018   CREATININE 2.31 (H) 07/14/2017 1018   CREATININE 1.91 (H) 06/27/2014 0926      Component Value Date/Time   CALCIUM 9.3 07/14/2017 1018   ALKPHOS 81 07/14/2017 1018   AST 20 07/14/2017 1018   ALT 16 07/14/2017 1018   BILITOT 0.4 07/14/2017 1018     He does routine vision care. He has dentures so he does not go to the dentist. He has aged out of colonoscopies.   His biggest concern today is that of unintentional weight loss. He reports that he has gone from 150 pounds to 110 today. He reports that he feels well overall, denies any n/v/d/ or fevers.   He feels as though his mind is " fine", although he does go on to state that he sometimes feels confused. He denies getting lost while driving or forgetting where he places his keys    Review of Systems  Constitutional: Positive for unexpected weight change.  HENT: Negative.   Eyes: Negative.   Respiratory: Negative.   Cardiovascular: Negative.   Gastrointestinal: Negative.   Endocrine: Negative.   Genitourinary: Negative.   Musculoskeletal: Negative.   Skin: Negative.   Allergic/Immunologic: Negative.   Neurological: Negative.   Hematological: Negative.   Psychiatric/Behavioral: Positive for confusion.  All other systems reviewed and are negative.  Past Medical History:  Diagnosis Date  . Anemia of chronic renal failure, stage 4 (severe) ([place]) 11/27/2015  . 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   S/p  ulcer surgury 1963 with vagotomy, partial gastrectomy with Bilroth I gastroenterostomy   . CAD S/P percutaneous coronary angioplasty - DES in RCA with PTCA for ISR; Moderate mLAD, 80% ostial OM2 stable 05/22/2008   Qualifier: Diagnosis of  By: Linda Hedges MD, Heinz Knuckles   . CKD (chronic kidney disease) stage 3, GFR 30-59 ml/min   . Essential hypertension 06/30/2007   Qualifier: Diagnosis of  By: Cori Razor RN, Mikal Plane    . H/O Inferior STEMI (09/2013) emergent PCI with Promus DES to RCA (3.0 mm  x 28 mm - 3.2 mm) 10/05/2013  .  Left-sided carotid artery disease -- s/p CEA 04/06/2006   S/p Left CEA   . Mitral regurgitation - onexam 09/03/2016    Social History   Social History  . Marital status: Married    Spouse name: N/A  . Number of children: N/A  . Years of education: N/A   Occupational History  .  Retired    Retired   Social History Main Topics  . Smoking status: Former Smoker    Types: Cigarettes    Quit date: 12/23/1964  . Smokeless tobacco: Never Used  . Alcohol use No  . Drug use: No  . Sexual activity: Not on file   Other Topics Concern  . Not on file   Social History Narrative   He is married with 2 children. He lives with his wife.   Very active at home, prior to a STEMI, he could walk several miles without discomfort. Prior to his MI, he would use his push mower to mow his entire lawn.   He is a former smoker and quit back in 1966. He does not drink alcohol    Past Surgical History:  Procedure Laterality Date  . AMPUTATION     left index finger -tramatic  . CARDIAC CATHETERIZATION  November 2011   40% mid LAD, 50% proximal RCA, 30-40% distal RCA (DOMINANT RCA with wraparound PDA & 2 large PLBs), 60-70% ostial OM1. No change from 2001. Medical therapy  . CAROTID ENDARTERECTOMY Left 2007   Dr.Hayes: 08/2016 Dopplers: Stable less than 40% right internal carotid stenosis and stable moderate 40-60% left internal carotid stenosis. Patent vertebral and subclavian arteries.  . CARPAL TUNNEL RELEASE    . CORNEAL TRANSPLANT    . CORONARY ANGIOPLASTY WITH STENT PLACEMENT  09/2013; 05/2014    d) Promus DES 3.0 mm x 28 mm (3.2 mm)  to RCA with STEMI; residual 70% mid-distal LAD, OM 270-80%. Medical management; b) PTCA of 75 % ISR , LAD lesion noted as ~40%  . FOOT SURGERY     left  . HERNIA REPAIR    . LEFT HEART CATHETERIZATION WITH CORONARY ANGIOGRAM N/A 10/05/2013   Procedure: LEFT HEART CATHETERIZATION WITH CORONARY ANGIOGRAM;  Surgeon: Peter M Martinique, MD;  Location[facility] LAB;  Service:  Cardiovascular;  RCA 100% - PCI,CxOM stable, LAD ~70% mid;  . LEFT HEART CATHETERIZATION WITH CORONARY ANGIOGRAM N/A 06/08/2014   Procedure: LEFT HEART CATHETERIZATION WITH CORONARY ANGIOGRAM;  Surgeon: Troy Sine, MD;  Location: [facility] CATH LAB;  Service: Cardiovascular;  UA 6/'15: 75% ISR in RCA - PTCA, LAD lesion ~40%, CxOM stable.    Marland Kitchen PARTIAL GASTRECTOMY    . PERCUTANEOUS CORONARY INTERVENTION-BALLOON ONLY  06/08/2014   Procedure: PERCUTANEOUS CORONARY INTERVENTION-BALLOON ONLY;  Surgeon: Troy Sine, MD;  Location: [facility] CATH LAB;  Service: Cardiovascular;;  . ROTATOR CUFF REPAIR     left  . SHOULDER ARTHROSCOPY WITH ROTATOR CUFF REPAIR  AND SUBACROMIAL DECOMPRESSION Right 07/01/2013   Procedure: RIGHT SHOULDER ARTHROSCOPY WITH  SUBACROMIAL DECOMPRESSION/DISTAL CLAVICLE RESECTION/ROTATOR CUFF REPAIR ;  Surgeon: Marin Shutter, MD;  Location: [place];  Service: Orthopedics;  Laterality: Right;  . SPINE SURGERY  1985   cervical laminectomy  . TOTAL KNEE ARTHROPLASTY    . TRANSTHORACIC ECHOCARDIOGRAM  09/'17; 4/'18   a) Normal EF 55-60%. Mild to moderate aortic stenosis with moderate regurgitation. Mild MR.;; b) Mild global reduction in LV systolic function: 86-57%; grade 1 DD - high LVEDP. Mild AS/AI/TR. Mild-Mod MR; moderately elevated PAP.  Marland Kitchen VAGOTOMY     partial gastrectomy    Family History  Problem Relation Age of Onset  . Heart disease Sister        Heart Transplant   . Diabetes Sister     Allergies  Allergen Reactions  . Amlodipine Besylate Hives  . Hydralazine Itching  . Lipitor [Atorvastatin] Itching  . Metoprolol Itching    PATIENT STATES HE HAS BEEN ITCHING ALL OVER - GENERIC METOPROLOL SUCCINATE  . Naproxen Diarrhea  . Oxycodone-Acetaminophen Itching  . Isosorbide Rash    Current Outpatient Prescriptions on File Prior to Visit  Medication Sig Dispense Refill  . aspirin EC 81 MG tablet Take 1 tablet (81 mg total) by mouth daily. 90 tablet 3  . brinzolamide (AZOPT) 1  % ophthalmic suspension Place 1 drop into the left eye every 12 (twelve) hours.     . nitroGLYCERIN (NITROSTAT) 0.4 MG SL tablet DISSOLVE 1 TABLET UNDER THE TONGUE EVERY 5 MINUTES AS NEEDED FOR CHEST PAIN 275 tablet 1  . TRAVATAN Z 0.004 % SOLN ophthalmic solution Place 1 drop into both eyes at bedtime.     . metoprolol succinate (TOPROL XL) 25 MG 24 hr tablet Take 1 tablet (25 mg total) by mouth daily. (Patient not taking: Reported on 07/15/2017) 30 tablet 11   No current facility-administered medications on file prior to visit.     BP (!) 150/68 (BP Location: Left Arm, Patient Position: Sitting, Cuff Size: Normal)   Pulse 100   Temp 97.6 F (36.4 C) (Oral)   Ht 5\' 7"  (1.702 m)   Wt 110 lb 12.8 oz (50.3 kg)   SpO2 95%   BMI 17.35 kg/m       Objective:   Physical Exam  Constitutional: He is oriented to person, place, and time. He appears well-developed and well-nourished. He appears cachectic. No distress.  HENT:  Head: Normocephalic and atraumatic.  Right Ear: External ear normal.  Left Ear: External ear normal.  Nose: Nose normal.  Mouth/Throat: Oropharynx is clear and moist. No oropharyngeal exudate.  Eyes: Pupils are equal, round, and reactive to light. Conjunctivae and EOM are normal. Right eye exhibits no discharge. Left eye exhibits no discharge. No scleral icterus.  Neck: Normal range of motion. Neck supple. No JVD present. No tracheal deviation present. No thyromegaly present.  Cardiovascular: Normal rate, regular rhythm, normal heart sounds and intact distal pulses.  Exam reveals no gallop and no friction rub.   No murmur heard. Pulmonary/Chest: Effort normal and breath sounds normal. No stridor. No respiratory distress. He has no wheezes. He has no rales. He exhibits no tenderness.  Abdominal: Soft. Bowel sounds are normal. He exhibits no distension and no mass. There is no tenderness. There is no rebound and no guarding.  Genitourinary: Rectum normal. Rectal exam shows  guaiac negative stool.  Musculoskeletal: Normal range of motion. He exhibits no edema, tenderness or deformity.  Lymphadenopathy:  He has no cervical adenopathy.  Neurological: He is alert and oriented to person, place, and time. He has normal reflexes. He displays normal reflexes. No cranial nerve deficit. He exhibits normal muscle tone. Coordination normal.  Skin: Skin is warm and dry. No rash noted. He is not diaphoretic. No erythema. No pallor.  Psychiatric: He has a normal mood and affect. His behavior is normal. Judgment and thought content normal.  Nursing note and vitals reviewed.     Assessment & Plan:  1. Essential hypertension - Continue with plan set forth by cardiology   2. Malnutrition of moderate degree - labs done by cardiology show baseline renal function, normal TSH, and liver function  - DG Chest 2 View; Future - Sedimentation Rate - C-reactive Protein - CBC/ diff 3. Unsp symptoms and signs w cognitive functions and awareness  MMSE 29/30  - Will continue to monitor   Dorothyann Peng, NP

## 2017-07-16 NOTE — Progress Notes (Signed)
Repeat BMET in 1-2 week is fine. It appears his anemia also improved based on lab work done by primary care provider

## 2017-07-28 ENCOUNTER — Telehealth: Payer: Self-pay | Admitting: Cardiology

## 2017-07-28 NOTE — Telephone Encounter (Signed)
Spoke with patient and he started back on his ASA 81 mg as instructed. Last week 3 days he had a nose bleed in am, none since. He does have a history of nose bleeds and has seen ENT. Advised patient to try use some saline nasal spray and polysporin ointment at bedtime to see if this would help. Advised to continue ASA and call back if any further issues.

## 2017-07-28 NOTE — Telephone Encounter (Signed)
If he has a bad bleeding, then may be minimal hold aspirin for a couple days. Then if he can take it every other day or every third day and that'll be fine.  Glenetta Hew, MD

## 2017-07-28 NOTE — Telephone Encounter (Signed)
New message  Pt wife call requesting to speak with RN about pt having nose bleeds. Please call back to discuss

## 2017-07-28 NOTE — Telephone Encounter (Signed)
Left message to call back 8/6

## 2017-07-29 ENCOUNTER — Encounter: Payer: Self-pay | Admitting: Cardiology

## 2017-07-29 NOTE — Telephone Encounter (Signed)
Advised wife  

## 2017-07-29 NOTE — Telephone Encounter (Signed)
Follow up       Returning a call to the nurse.  Please call back between 11-12.

## 2017-07-29 NOTE — Telephone Encounter (Signed)
This encounter was created in error - please disregard.

## 2017-07-29 NOTE — Telephone Encounter (Signed)
Nicholas Porter at 07/29/2017 9:01 AM   Status: Signed    Follow up   Returning a call to the nurse.  Please call back between 11-12.

## 2017-07-30 ENCOUNTER — Telehealth: Payer: Self-pay | Admitting: *Deleted

## 2017-07-30 DIAGNOSIS — N289 Disorder of kidney and ureter, unspecified: Secondary | ICD-10-CM | POA: Diagnosis not present

## 2017-07-30 DIAGNOSIS — N183 Chronic kidney disease, stage 3 (moderate): Secondary | ICD-10-CM | POA: Diagnosis not present

## 2017-07-30 DIAGNOSIS — E875 Hyperkalemia: Secondary | ICD-10-CM | POA: Diagnosis not present

## 2017-07-30 LAB — BASIC METABOLIC PANEL
BUN / CREAT RATIO: 24 (ref 10–24)
BUN: 55 mg/dL — AB (ref 8–27)
CHLORIDE: 113 mmol/L — AB (ref 96–106)
CO2: 14 mmol/L — ABNORMAL LOW (ref 20–29)
Calcium: 9.2 mg/dL (ref 8.6–10.2)
Creatinine, Ser: 2.26 mg/dL — ABNORMAL HIGH (ref 0.76–1.27)
GFR calc non Af Amer: 25 mL/min/{1.73_m2} — ABNORMAL LOW (ref >59)
GFR, EST AFRICAN AMERICAN: 29 mL/min/{1.73_m2} — AB (ref >59)
Glucose: 82 mg/dL (ref 65–99)
Potassium: 5.6 mmol/L — ABNORMAL HIGH (ref 3.5–5.2)
Sodium: 141 mmol/L (ref 134–144)

## 2017-07-30 NOTE — Telephone Encounter (Signed)
Order placed for BMET  

## 2017-07-31 NOTE — Telephone Encounter (Signed)
Kidney function stable. Potassium level improved slightly, but still high, unclear cause, as he is not on any medication that can increase it. Please forward the lab to primary care provider.

## 2017-08-01 ENCOUNTER — Encounter: Payer: Self-pay | Admitting: Family Medicine

## 2017-08-01 ENCOUNTER — Telehealth: Payer: Self-pay | Admitting: Cardiology

## 2017-08-01 NOTE — Telephone Encounter (Signed)
New message   Pt wife thinks someone is calling them regarding blood test results.

## 2017-08-01 NOTE — Telephone Encounter (Signed)
Contacted Pt Wife, Verbalize Mr. Hannum labs results

## 2017-08-12 ENCOUNTER — Encounter: Payer: Self-pay | Admitting: Family Medicine

## 2017-08-21 ENCOUNTER — Telehealth: Payer: Self-pay | Admitting: Cardiology

## 2017-08-21 ENCOUNTER — Other Ambulatory Visit: Payer: Self-pay

## 2017-08-21 MED ORDER — NITROGLYCERIN 0.4 MG SL SUBL: SUBLINGUAL_TABLET | SUBLINGUAL | 12 refills | Status: DC

## 2017-08-21 NOTE — Telephone Encounter (Signed)
New message        *STAT* If patient is at the pharmacy, call can be transferred to refill team.   1. Which medications need to be refilled? (please list name of each medication and dose if known)  Nitro 0.4mg  (medication was in shirt pocket and pt washed nitro in laundry)  2. Which pharmacy/location (including street and city if local pharmacy) is medication to be sent to? [facility] at [address]. Do they need a 30 day or 90 day supply?  30 day

## 2017-08-21 NOTE — Telephone Encounter (Signed)
Refill sent to the pharmacy electronically.  

## 2017-08-27 ENCOUNTER — Ambulatory Visit (INDEPENDENT_AMBULATORY_CARE_PROVIDER_SITE_OTHER): Payer: Medicare Other | Admitting: Adult Health

## 2017-08-27 ENCOUNTER — Encounter: Payer: Self-pay | Admitting: Adult Health

## 2017-08-27 VITALS — BP 148/60 | Temp 98.2°F | Wt 111.0 lb

## 2017-08-27 DIAGNOSIS — Z9861 Coronary angioplasty status: Secondary | ICD-10-CM

## 2017-08-27 DIAGNOSIS — L299 Pruritus, unspecified: Secondary | ICD-10-CM

## 2017-08-27 DIAGNOSIS — L853 Xerosis cutis: Secondary | ICD-10-CM

## 2017-08-27 DIAGNOSIS — I2510 Atherosclerotic heart disease of native coronary artery without angina pectoris: Secondary | ICD-10-CM

## 2017-08-27 NOTE — Progress Notes (Signed)
Subjective:    Patient ID: Nicholas Porter, male    DOB: 1930/07/18, 83 y.o.   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   He presents to the office today with the complaint of " itchy skin on his arms". When we last spoke I advised him to try a good lotion, this he failed to do.   He is concerned that it may be from a medication he is taking. He has not started any new medications   Review of Systems See HPI   Past Medical History:  Diagnosis Date  . Anemia of chronic renal failure, stage 4 (severe) ([place]) 11/27/2015  . 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 CAD S/P percutaneous coronary angioplasty - DES in RCA with PTCA for ISR; Moderate mLAD, 80% ostial OM2 stable 05/22/2008   Qualifier: Diagnosis of  By: Linda Hedges MD, Heinz Knuckles   . CKD (chronic kidney disease) stage 3, GFR 30-59 ml/min   . Essential hypertension 06/30/2007   Qualifier: Diagnosis of  By: Cori Razor RN, Mikal Plane    . H/O Inferior STEMI (09/2013) emergent PCI with Promus DES to RCA (3.0 mm  x 28 mm - 3.2 mm) 10/05/2013  . Left-sided carotid artery disease -- s/p CEA 04/06/2006   S/p Left CEA   . Mitral regurgitation - onexam 09/03/2016    Social History   Social History  . Marital status: Married    Spouse name: N/A  . Number of children: N/A  . Years of education: N/A    Occupational History  .  Retired    Retired   Social History Main Topics  . Smoking status: Former Smoker    Types: Cigarettes    Quit date: 12/23/1964  . Smokeless tobacco: Never Used  . Alcohol use No  . Drug use: No  . Sexual activity: Not on file   Other Topics Concern  . Not on file   Social History Narrative   He is married with 2 children. He lives with his wife.   Very active at home, prior to a STEMI, he could walk several miles without discomfort. Prior to his MI, he would use his push mower to mow his entire lawn.   He is a former smoker and quit back in 1966. He does not drink alcohol    Past Surgical History:  Procedure Laterality Date  . AMPUTATION     left index finger -tramatic  . CARDIAC CATHETERIZATION  November 2011   40% mid LAD, 50% proximal RCA, 30-40% distal RCA (DOMINANT RCA with wraparound PDA & 2 large PLBs), 60-70% ostial OM1. No change from 2001. Medical therapy  . CAROTID ENDARTERECTOMY Left 2007   Dr.Hayes: 08/2016 Dopplers: Stable less than 40% right internal carotid stenosis and stable moderate 40-60% left internal carotid stenosis. Patent vertebral and subclavian arteries.  . CARPAL TUNNEL RELEASE    . CORNEAL  TRANSPLANT    . CORONARY ANGIOPLASTY WITH STENT PLACEMENT  09/2013; 05/2014    d) Promus DES 3.0 mm x 28 mm (3.2 mm)  to RCA with STEMI; residual 70% mid-distal LAD, OM 270-80%. Medical management; b) PTCA of 75 % ISR , LAD lesion noted as ~40%  . FOOT SURGERY     left  . HERNIA REPAIR    . LEFT HEART CATHETERIZATION WITH CORONARY ANGIOGRAM N/A 10/05/2013   Procedure: LEFT HEART CATHETERIZATION WITH CORONARY ANGIOGRAM;  Surgeon: Peter M Martinique, MD;  Location: [facility] CATH LAB;  Service: Cardiovascular;  RCA 100% - PCI,CxOM stable, LAD ~70% mid;  . LEFT HEART CATHETERIZATION WITH CORONARY ANGIOGRAM N/A 06/08/2014   Procedure: LEFT HEART CATHETERIZATION WITH CORONARY ANGIOGRAM;  Surgeon: Troy Sine, MD;  Location: [facility] CATH LAB;  Service:  Cardiovascular;  UA 6/'15: 75% ISR in RCA - PTCA, LAD lesion ~40%, CxOM stable.    Marland Kitchen PARTIAL GASTRECTOMY    . PERCUTANEOUS CORONARY INTERVENTION-BALLOON ONLY  06/08/2014   Procedure: PERCUTANEOUS CORONARY INTERVENTION-BALLOON ONLY;  Surgeon: Troy Sine, MD;  Location: [facility] CATH LAB;  Service: Cardiovascular;;  . ROTATOR CUFF REPAIR     left  . SHOULDER ARTHROSCOPY WITH ROTATOR CUFF REPAIR AND SUBACROMIAL DECOMPRESSION Right 07/01/2013   Procedure: RIGHT SHOULDER ARTHROSCOPY WITH  SUBACROMIAL DECOMPRESSION/DISTAL CLAVICLE RESECTION/ROTATOR CUFF REPAIR ;  Surgeon: Marin Shutter, MD;  Location: [place];  Service: Orthopedics;  Laterality: Right;  . SPINE SURGERY  1985   cervical laminectomy  . TOTAL KNEE ARTHROPLASTY    . TRANSTHORACIC ECHOCARDIOGRAM  09/'17; 4/'18   a) Normal EF 55-60%. Mild to moderate aortic stenosis with moderate regurgitation. Mild MR.;; b) Mild global reduction in LV systolic function: 40-81%; grade 1 DD - high LVEDP. Mild AS/AI/TR. Mild-Mod MR; moderately elevated PAP.  Marland Kitchen VAGOTOMY     partial gastrectomy    Family History  Problem Relation Age of Onset  . Heart disease Sister        Heart Transplant   . Diabetes Sister     Allergies  Allergen Reactions  . Amlodipine Besylate Hives  . Hydralazine Itching  . Lipitor [Atorvastatin] Itching  . Metoprolol Itching    PATIENT STATES HE HAS BEEN ITCHING ALL OVER - GENERIC METOPROLOL SUCCINATE  . Naproxen Diarrhea  . Oxycodone-Acetaminophen Itching  . Isosorbide Rash    Current Outpatient Prescriptions on File Prior to Visit  Medication Sig Dispense Refill  . aspirin EC 81 MG tablet Take 1 tablet (81 mg total) by mouth daily. (Patient taking differently: Take 81 mg by mouth every other day. ) 90 tablet 3  . brinzolamide (AZOPT) 1 % ophthalmic suspension Place 1 drop into the left eye every 12 (twelve) hours.     . TRAVATAN Z 0.004 % SOLN ophthalmic solution Place 1 drop into both eyes at bedtime.     .  nitroGLYCERIN (NITROSTAT) 0.4 MG SL tablet DISSOLVE 1 TABLET UNDER THE TONGUE EVERY 5 MINUTES AS NEEDED FOR CHEST PAIN (Patient not taking: Reported on 08/27/2017) 25 tablet 12   No current facility-administered medications on file prior to visit.     BP (!) 148/60 (BP Location: Left Arm)   Temp 98.2 F (36.8 C) (Oral)   Wt 111 lb (50.3 kg)   BMI 17.39 kg/m       Objective:   Physical Exam  Constitutional: He is oriented to person, place, and time.  Musculoskeletal: Normal range of motion.  Neurological: He is alert and oriented to person,  place, and time.  Skin: Skin is warm and dry. No rash noted. No erythema. No pallor.  No rash noted. He does have dry flaky skin on bilateral arms. No scratch marks noted   Psychiatric: He has a normal mood and affect. His behavior is normal. Judgment and thought content normal.  Nursing note and vitals reviewed.     Assessment & Plan:  He has been on the same medications for a long time, I advised him that I doubt that it is his medications that are causing him to itch. Again, advised to get a good lotion and try this for the next 2-3 weeks - Follow up as needed  Dorothyann Peng, NP

## 2017-09-02 DIAGNOSIS — N183 Chronic kidney disease, stage 3 (moderate): Secondary | ICD-10-CM | POA: Diagnosis not present

## 2017-09-02 DIAGNOSIS — N179 Acute kidney failure, unspecified: Secondary | ICD-10-CM | POA: Diagnosis not present

## 2017-09-30 DIAGNOSIS — Z23 Encounter for immunization: Secondary | ICD-10-CM | POA: Diagnosis not present

## 2017-10-14 DIAGNOSIS — H401132 Primary open-angle glaucoma, bilateral, moderate stage: Secondary | ICD-10-CM | POA: Diagnosis not present

## 2017-11-12 ENCOUNTER — Ambulatory Visit (INDEPENDENT_AMBULATORY_CARE_PROVIDER_SITE_OTHER): Payer: Medicare Other | Admitting: Cardiology

## 2017-11-12 ENCOUNTER — Encounter: Payer: Self-pay | Admitting: Cardiology

## 2017-11-12 VITALS — BP 177/66 | HR 60 | Ht 67.0 in | Wt 110.0 lb

## 2017-11-12 DIAGNOSIS — Z9861 Coronary angioplasty status: Secondary | ICD-10-CM | POA: Diagnosis not present

## 2017-11-12 DIAGNOSIS — I951 Orthostatic hypotension: Secondary | ICD-10-CM

## 2017-11-12 DIAGNOSIS — I340 Nonrheumatic mitral (valve) insufficiency: Secondary | ICD-10-CM | POA: Diagnosis not present

## 2017-11-12 DIAGNOSIS — I779 Disorder of arteries and arterioles, unspecified: Secondary | ICD-10-CM

## 2017-11-12 DIAGNOSIS — I10 Essential (primary) hypertension: Secondary | ICD-10-CM | POA: Diagnosis not present

## 2017-11-12 DIAGNOSIS — I2510 Atherosclerotic heart disease of native coronary artery without angina pectoris: Secondary | ICD-10-CM | POA: Diagnosis not present

## 2017-11-12 DIAGNOSIS — N184 Chronic kidney disease, stage 4 (severe): Secondary | ICD-10-CM

## 2017-11-12 DIAGNOSIS — I739 Peripheral vascular disease, unspecified: Secondary | ICD-10-CM

## 2017-11-12 DIAGNOSIS — D631 Anemia in chronic kidney disease: Secondary | ICD-10-CM

## 2017-11-12 NOTE — Patient Instructions (Signed)
Medication Instructions: Your physician recommends that you continue on your current medications as directed. Please refer to the Current Medication list given to you today.  If you need a refill on your cardiac medications before your next appointment, please call your pharmacy.    Follow-Up: Your physician wants you to follow-up in January with Almyra Deforest, PA and in 6 months with Dr. Ellyn Hack. You will receive a reminder letter in the mail two months in advance. If you don't receive a letter, please call our office at [phone] to schedule this follow-up appointment.    Thank you for choosing Heartcare at [facility]!!

## 2017-11-12 NOTE — Progress Notes (Signed)
PCP: Dorothyann Peng, NP  Clinic Note: Chief Complaint  Patient presents with  . Follow-up    CAD    HPI: Nicholas Porter is a 83 y.o. male with a PMH below who presents today for 56-month /hospital follow-up for CAD-PCI, STEMI. CVD History:  H/O Inferior STEMI (09/2013) emergent PCI with Promus DES to RCA (3.0 mm x 28 mm - 3.2 mm)  Unstable Angina 05/2014: ISR - cutting balloon PTCA ; Ostial OM2 80% (stable) - f/u Myoview in 08/2014 - diaphragmatic attenuation. No Ischemia or Infarction.  L Carotid Dz - s/p CEA 03/2014  He had a very difficult spell with several hospitalizations ranging from March into April of this year with sepsis from UTI.  He had demand ischemia noted with elevated troponin levels. He had been started on 25 Coreg twice a day which had to be stopped or due to hypotension and he was put back on his home dose of 37.5mg  of Toprol; losartan was discontinued during the preceding hospitalization partly due to hypotension and also partly due to acute on chronic renal dysfunction -- I recommended continuing to stay off it until she would be evaluated by nephrology.  Is also noted to be anemic.  Kaimen Peine was last seen on July 23 by Almyra Deforest, PA -there was concern about rash related to medication.  -I last saw him in July as follow-up from his prolonged hospital stays..  Recent Hospitalizations:   None  Studies Personally Reviewed -( if available, images/films reviewed: From Epic Chart or Care Everywhere)  No new studies  Interval History: Nicholas Porter returns today for his routine follow-up feeling quite well overall from a cardiac standpoint.  He feels much better, is gaining some strength back.  Major complaint is that he has dry irritated skin that is quite itchy. He tells me that he is pretty much back to his baseline now with normal energy levels.  He was out raking leaves in the yard and and riding his tractor.  He had no problems at all with chest tightness or  pressure.  No dyspnea. No PND, orthopnea or edema.  No rapid irregular heartbeats or palpitations.  No syncope/near syncope, or TIA/amaurosis fugax symptoms. He has noted some epistaxis, but no significant bleeding or bruising.  No melena, hematochezia or hematuria.  ROS: A comprehensive was performed. Review of Systems  Constitutional: Negative for fever, malaise/fatigue (Notably improved energy) and weight loss.  HENT: Positive for nosebleeds. Negative for congestion.   Eyes: Positive for blurred vision.  Respiratory: Negative for shortness of breath (With notable exertion. He just generally feels weak).   Cardiovascular: Negative for leg swelling.  Gastrointestinal: Negative for blood in stool, diarrhea, melena and vomiting.  Genitourinary: Negative for dysuria and hematuria.  Musculoskeletal: Negative for falls and joint pain.  Skin: Positive for itching.  Neurological: Negative for dizziness, focal weakness, loss of consciousness and weakness.  Psychiatric/Behavioral: Positive for memory loss.  All other systems reviewed and are negative.   I have reviewed and (if needed) personally updated the patient's problem list, medications, allergies, past medical and surgical history, social and family history. -- Extensive review of recent hospitalizations and and procedures/studies reviewed. Results for studies were entered into the past surgical history as well as above. Extensor review of hospital discharge summary and cardiology notes.  Past Medical History:  Diagnosis Date  . Anemia of chronic renal failure, stage 4 (severe) ([facility]) 11/27/2015  . 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 CAD S/P percutaneous coronary angioplasty - DES in RCA with PTCA for ISR; Moderate mLAD, 80% ostial OM2 stable 05/22/2008   Qualifier: Diagnosis of  By: Linda Hedges MD, Heinz Knuckles   . CKD (chronic kidney disease) stage 3, GFR  30-59 ml/min ([facility])   . Essential hypertension 06/30/2007   Qualifier: Diagnosis of  By: Cori Razor RN, Mikal Plane    . H/O Inferior STEMI (09/2013) emergent PCI with Promus DES to RCA (3.0 mm  x 28 mm - 3.2 mm) 10/05/2013  . Left-sided carotid artery disease -- s/p CEA 04/06/2006   S/p Left CEA   . Mitral regurgitation - onexam 09/03/2016    Past Surgical History:  Procedure Laterality Date  . AMPUTATION     left index finger -tramatic  . CARDIAC CATHETERIZATION  November 2011   40% mid LAD, 50% proximal RCA, 30-40% distal RCA (DOMINANT RCA with wraparound PDA & 2 large PLBs), 60-70% ostial OM1. No change from 2001. Medical therapy  . CAROTID ENDARTERECTOMY Left 2007   Dr.Hayes: 08/2016 Dopplers: Stable less than 40% right internal carotid stenosis and stable moderate 40-60% left internal carotid stenosis. Patent vertebral and subclavian arteries.  . CARPAL TUNNEL RELEASE    . CORNEAL TRANSPLANT    . CORONARY ANGIOPLASTY WITH STENT PLACEMENT  09/2013; 05/2014    d) Promus DES 3.0 mm x 28 mm (3.2 mm)  to RCA with STEMI; residual 70% mid-distal LAD, OM 270-80%. Medical management; b) PTCA of 75 % ISR , LAD lesion noted as ~40%  . FOOT SURGERY     left  . HERNIA REPAIR    . LEFT HEART CATHETERIZATION WITH CORONARY ANGIOGRAM N/A 10/05/2013   Procedure: LEFT HEART CATHETERIZATION WITH CORONARY ANGIOGRAM;  Surgeon: Peter M Martinique, MD;  Location: [facility] CATH LAB;  Service: Cardiovascular;  RCA 100% - PCI,CxOM stable, LAD ~70% mid;  . LEFT HEART CATHETERIZATION WITH CORONARY ANGIOGRAM N/A 06/08/2014   Procedure: LEFT HEART CATHETERIZATION WITH CORONARY ANGIOGRAM;  Surgeon: Troy Sine, MD;  Location: [facility] CATH LAB;  Service: Cardiovascular;  UA 6/'15: 75% ISR in RCA - PTCA, LAD lesion ~40%, CxOM stable.    Marland Kitchen NM MYOVIEW LTD  03/2017    - Elevatred Troponin in setting of Sepsis -- DEMAND ISCHEMIA.  Normal perfusion. LVEF 51% with normal wall motion. This is a low risk study.  Marland Kitchen PARTIAL GASTRECTOMY    .  PERCUTANEOUS CORONARY INTERVENTION-BALLOON ONLY  06/08/2014   Procedure: PERCUTANEOUS CORONARY INTERVENTION-BALLOON ONLY;  Surgeon: Troy Sine, MD;  Location: [facility] CATH LAB;  Service: Cardiovascular;;  . ROTATOR CUFF REPAIR     left  . SHOULDER ARTHROSCOPY WITH ROTATOR CUFF REPAIR AND SUBACROMIAL DECOMPRESSION Right 07/01/2013   Procedure: RIGHT SHOULDER ARTHROSCOPY WITH  SUBACROMIAL DECOMPRESSION/DISTAL CLAVICLE RESECTION/ROTATOR CUFF REPAIR ;  Surgeon: Marin Shutter, MD;  Location: [facility];  Service: Orthopedics;  Laterality: Right;  . SPINE SURGERY  1985   cervical laminectomy  . TOTAL KNEE ARTHROPLASTY    . TRANSTHORACIC ECHOCARDIOGRAM  09/'17; 4/'18   a) Normal EF 55-60%. Mild to moderate aortic stenosis with moderate regurgitation. Mild MR.;; b) Mild global reduction in LV systolic function: 94-70%; grade 1 DD - high LVEDP. Mild AS/AI/TR. Mild-Mod MR; moderately elevated PAP.  Marland Kitchen VAGOTOMY     partial gastrectomy    2D Echo 03/30/17: Mild global reduction in LV systolic function: 96-28%; grade 1 DD - high LVEDP. Mild AS/AI/TR. Mild-Mod MR; moderately elevated pulmonary pressure.(Ordered  as part of his inpatient evaluation)  Myoview 03/27/17: Normal perfusion. LVEF 51% with normal wall motion. This is a low risk study.   Current Meds  Medication Sig  . aspirin EC 81 MG tablet Take 1 tablet (81 mg total) by mouth daily. (Patient taking differently: Take 81 mg by mouth every other day. )  . brinzolamide (AZOPT) 1 % ophthalmic suspension Place 1 drop into the left eye every 12 (twelve) hours.   . metoprolol succinate (TOPROL-XL) 25 MG 24 hr tablet Take 25 mg by mouth daily.  . nitroGLYCERIN (NITROSTAT) 0.4 MG SL tablet DISSOLVE 1 TABLET UNDER THE TONGUE EVERY 5 MINUTES AS NEEDED FOR CHEST PAIN  . TRAVATAN Z 0.004 % SOLN ophthalmic solution Place 1 drop into both eyes at bedtime.     Allergies  Allergen Reactions  . Amlodipine Besylate Hives  . Hydralazine Itching  . Lipitor [Atorvastatin]  Itching  . Metoprolol Itching    PATIENT STATES HE HAS BEEN ITCHING ALL OVER - GENERIC METOPROLOL SUCCINATE  . Naproxen Diarrhea  . Oxycodone-Acetaminophen Itching  . Isosorbide Rash    Social History   Socioeconomic History  . Marital status: Married    Spouse name: None  . Number of children: None  . Years of education: None  . Highest education level: None  Social Needs  . Financial resource strain: None  . Food insecurity - worry: None  . Food insecurity - inability: None  . Transportation needs - medical: None  . Transportation needs - non-medical: None  Occupational History    Employer: RETIRED    Comment: Retired  Tobacco Use  . Smoking status: Former Smoker    Types: Cigarettes    Last attempt to quit: 12/23/1964    Years since quitting: 52.9  . Smokeless tobacco: Never Used  Substance and Sexual Activity  . Alcohol use: No  . Drug use: No  . Sexual activity: None  Other Topics Concern  . None  Social History Narrative   He is married with 2 children. He lives with his wife.   Very active at home, prior to a STEMI, he could walk several miles without discomfort. Prior to his MI, he would use his push mower to mow his entire lawn.   He is a former smoker and quit back in 1966. He does not drink alcohol    family history includes Diabetes in his sister; Heart disease in his sister.  Wt Readings from Last 3 Encounters:  11/12/17 110 lb (49.9 kg)  08/27/17 111 lb (50.3 kg)  07/15/17 110 lb 12.8 oz (50.3 kg)    PHYSICAL EXAM BP (!) 177/66   Pulse 60   Ht 5\' 7"  (1.702 m)   Wt 110 lb (49.9 kg)   BMI 17.23 kg/m   Physical Exam  Constitutional: He is oriented to person, place, and time. He appears well-developed and well-nourished. No distress.  Stable wight.  Healthy appearing - acts younger thn stated age.  - still thin - but not as frail.  HENT:  Head: Normocephalic and atraumatic.  Eyes: Pupils are equal, round, and reactive to light.  Neck: No  hepatojugular reflux and no JVD present. Carotid bruit is not present.  Cardiovascular: Normal rate, regular rhythm, intact distal pulses and normal pulses.  No extrasystoles are present. PMI is not displaced. Exam reveals no gallop and no friction rub.  Murmur heard.  Medium-pitched harsh crescendo-decrescendo early systolic murmur is present with a grade of 2/6 at the upper right  sternal border radiating to the neck.  Low-pitched blowing decrescendo early diastolic murmur is present with a grade of 1/6 at the upper right sternal border and lower left sternal border. Pulmonary/Chest: Effort normal and breath sounds normal. No respiratory distress. He has no wheezes. He has no rales.  Abdominal: Soft. Bowel sounds are normal. He exhibits no distension. There is no tenderness. There is no rebound.  Musculoskeletal: Normal range of motion. He exhibits no edema.  Neurological: He is alert and oriented to person, place, and time.  Skin: Skin is warm.  Psychiatric: He has a normal mood and affect. His behavior is normal. Judgment and thought content normal.  He is very jovial, and animated today.  Nursing note and vitals reviewed.   Adult ECG Report n/a  Other studies Reviewed: Additional studies/ records that were reviewed today include:  Recent Labs:   Lab Results  Component Value Date   CREATININE 2.26 (H) 07/30/2017   BUN 55 (H) 07/30/2017   NA 141 07/30/2017   K 5.6 (H) 07/30/2017   CL 113 (H) 07/30/2017   CO2 14 (L) 07/30/2017  Cr down from 3.82  Lab Results  Component Value Date   CHOL 122 07/20/2015   HDL 37.90 (L) 07/20/2015   LDLCALC 63 07/20/2015   TRIG 106.0 07/20/2015   CHOLHDL 3 07/20/2015    ASSESSMENT / PLAN: Problem List Items Addressed This Visit    Anemia of chronic renal failure, stage 4 (severe) ([facility]) (Chronic)    Remains off of Plavix, but now back on aspirin.  Mild epistaxis, no other bleeding noted.  Last lab check on him was in August -> will defer to  PCP.      CAD S/P percutaneous coronary angioplasty - DES in RCA with PTCA for ISR; Moderate mLAD, 80% ostial OM2 stable - Primary (Chronic)    Doing well now.  No chest pain episodes.  Negative/nonischemic Myoview and relatively normal echocardiogram earlier this year. He is only on aspirin now no longer on Plavix.  He is still having intermittent nosebleeds and I said he could hold aspirin temporarily if necessary. Continue beta-blocker.  Not on statin due to myalgias.  At this point he would prefer not to change medications.      Essential hypertension (Chronic)    Significantly elevated blood pressure today.  This is unusual for him and has been much better controlled at home. Did recommend that he follows his blood pressures at home and we may need to adjust his meds.  He is only on low-dose Toprol which I cannot increase.  He has so many interactions with medications I think the itching probably being listed as a drug allergy is unfair and could probably be readdressed.  With his renal insufficiency, I am reluctant to use an ARB, however all the other options are listed as allergies.  I suspect that these are not causing his itchiness, because he continues to be itchy. If he still has elevated blood pressures and follow-up visits, we will need to consider reinitiating something like felodipine.      Left-sided carotid artery disease -- s/p CEA (Chronic)    Dopplers have been stable.  May need follow-up before I see him back in a year. This can be ordered when he is seen by PA in 6 months.      Mitral regurgitation - on exam    Not a significant amount of MR noted on echo.  Would simply follow clinically.  Orthostatic hypotension    When I saw him in May he was hypertensive off of his ARB stopped due to renal insufficiency.  He had a complicated hospital course with dehydration etc. No longer noticing the symptom.  We can probably restart additional antihypertensives if he remains  hypertensive during next visit.         Current medicines are reviewed at length with the patient today. (+/- concerns) needs clarification on Meds (after d/c) The following changes have been made: see below.  Patient Instructions  Medication Instructions: Your physician recommends that you continue on your current medications as directed. Please refer to the Current Medication list given to you today.  If you need a refill on your cardiac medications before your next appointment, please call your pharmacy.    Follow-Up: Your physician wants you to follow-up in January with Almyra Deforest, PA and in 6 months with Dr. Ellyn Hack. You will receive a reminder letter in the mail two months in advance. If you don't receive a letter, please call our office at [phone] to schedule this follow-up appointment.    Thank you for choosing [facility] at [facility]!!        Studies Ordered:   No orders of the defined types were placed in this encounter.     Glenetta Hew, M.D., M.S. Interventional Cardiologist   Pager # [phone] Phone # [phone] [address] [place], [place] [address]

## 2017-11-14 ENCOUNTER — Encounter: Payer: Self-pay | Admitting: Cardiology

## 2017-11-14 NOTE — Assessment & Plan Note (Signed)
Significantly elevated blood pressure today.  This is unusual for him and has been much better controlled at home. Did recommend that he follows his blood pressures at home and we may need to adjust his meds.  He is only on low-dose Toprol which I cannot increase.  He has so many interactions with medications I think the itching probably being listed as a drug allergy is unfair and could probably be readdressed.  With his renal insufficiency, I am reluctant to use an ARB, however all the other options are listed as allergies.  I suspect that these are not causing his itchiness, because he continues to be itchy. If he still has elevated blood pressures and follow-up visits, we will need to consider reinitiating something like felodipine.

## 2017-11-14 NOTE — Assessment & Plan Note (Signed)
When I saw him in May he was hypertensive off of his ARB stopped due to renal insufficiency.  He had a complicated hospital course with dehydration etc. No longer noticing the symptom.  We can probably restart additional antihypertensives if he remains hypertensive during next visit.

## 2017-11-14 NOTE — Assessment & Plan Note (Signed)
Remains off of Plavix, but now back on aspirin.  Mild epistaxis, no other bleeding noted.  Last lab check on him was in August -> will defer to PCP.

## 2017-11-14 NOTE — Assessment & Plan Note (Signed)
Doing well now.  No chest pain episodes.  Negative/nonischemic Myoview and relatively normal echocardiogram earlier this year. He is only on aspirin now no longer on Plavix.  He is still having intermittent nosebleeds and I said he could hold aspirin temporarily if necessary. Continue beta-blocker.  Not on statin due to myalgias.  At this point he would prefer not to change medications.

## 2017-11-14 NOTE — Assessment & Plan Note (Signed)
Not a significant amount of MR noted on echo.  Would simply follow clinically.

## 2017-11-14 NOTE — Assessment & Plan Note (Signed)
Dopplers have been stable.  May need follow-up before I see him back in a year. This can be ordered when he is seen by PA in 6 months.

## 2018-01-12 ENCOUNTER — Emergency Department (HOSPITAL_COMMUNITY)
Admission: EM | Admit: 2018-01-12 | Discharge: 2018-01-12 | Disposition: A | Discharge status: Home / Self Care | Payer: Medicare Other | Attending: Emergency Medicine | Admitting: Emergency Medicine

## 2018-01-12 ENCOUNTER — Encounter (HOSPITAL_COMMUNITY): Payer: Self-pay | Admitting: Nurse Practitioner

## 2018-01-12 ENCOUNTER — Emergency Department (HOSPITAL_COMMUNITY): Payer: Medicare Other

## 2018-01-12 ENCOUNTER — Other Ambulatory Visit: Payer: Self-pay

## 2018-01-12 DIAGNOSIS — Z87891 Personal history of nicotine dependence: Secondary | ICD-10-CM | POA: Insufficient documentation

## 2018-01-12 DIAGNOSIS — Y93H9 Activity, other involving exterior property and land maintenance, building and construction: Secondary | ICD-10-CM | POA: Diagnosis not present

## 2018-01-12 DIAGNOSIS — R51 Headache: Secondary | ICD-10-CM | POA: Diagnosis not present

## 2018-01-12 DIAGNOSIS — Z7982 Long term (current) use of aspirin: Secondary | ICD-10-CM | POA: Insufficient documentation

## 2018-01-12 DIAGNOSIS — I129 Hypertensive chronic kidney disease with stage 1 through stage 4 chronic kidney disease, or unspecified chronic kidney disease: Secondary | ICD-10-CM | POA: Diagnosis not present

## 2018-01-12 DIAGNOSIS — I2510 Atherosclerotic heart disease of native coronary artery without angina pectoris: Secondary | ICD-10-CM | POA: Diagnosis not present

## 2018-01-12 DIAGNOSIS — N183 Chronic kidney disease, stage 3 (moderate): Secondary | ICD-10-CM | POA: Insufficient documentation

## 2018-01-12 DIAGNOSIS — Y998 Other external cause status: Secondary | ICD-10-CM | POA: Insufficient documentation

## 2018-01-12 DIAGNOSIS — Y92009 Unspecified place in unspecified non-institutional (private) residence as the place of occurrence of the external cause: Secondary | ICD-10-CM | POA: Insufficient documentation

## 2018-01-12 DIAGNOSIS — S0181XA Laceration without foreign body of other part of head, initial encounter: Secondary | ICD-10-CM | POA: Insufficient documentation

## 2018-01-12 DIAGNOSIS — Z79899 Other long term (current) drug therapy: Secondary | ICD-10-CM | POA: Diagnosis not present

## 2018-01-12 DIAGNOSIS — W19XXXA Unspecified fall, initial encounter: Secondary | ICD-10-CM

## 2018-01-12 DIAGNOSIS — Z23 Encounter for immunization: Secondary | ICD-10-CM | POA: Diagnosis not present

## 2018-01-12 DIAGNOSIS — W01198A Fall on same level from slipping, tripping and stumbling with subsequent striking against other object, initial encounter: Secondary | ICD-10-CM | POA: Diagnosis not present

## 2018-01-12 DIAGNOSIS — S0003XA Contusion of scalp, initial encounter: Secondary | ICD-10-CM | POA: Diagnosis not present

## 2018-01-12 LAB — CBG MONITORING, ED: GLUCOSE-CAPILLARY: 96 mg/dL (ref 65–99)

## 2018-01-12 MED ORDER — ACETAMINOPHEN 500 MG PO TABS
500.0000 mg | ORAL_TABLET | Freq: Once | ORAL | Status: AC
  Administered 2018-01-12: 500 mg via ORAL
  Filled 2018-01-12: qty 1

## 2018-01-12 MED ORDER — TETANUS-DIPHTH-ACELL PERTUSSIS 5-2.5-18.5 LF-MCG/0.5 IM SUSP
0.5000 mL | Freq: Once | INTRAMUSCULAR | Status: AC
  Administered 2018-01-12: 0.5 mL via INTRAMUSCULAR
  Filled 2018-01-12: qty 0.5

## 2018-01-12 MED ORDER — TRAMADOL HCL 50 MG PO TABS
50.0000 mg | ORAL_TABLET | Freq: Once | ORAL | Status: AC
  Administered 2018-01-12: 50 mg via ORAL
  Filled 2018-01-12: qty 1

## 2018-01-12 MED ORDER — LIDOCAINE-EPINEPHRINE (PF) 2 %-1:200000 IJ SOLN
20.0000 mL | Freq: Once | INTRAMUSCULAR | Status: AC
  Administered 2018-01-12: 20 mL
  Filled 2018-01-12: qty 20

## 2018-01-12 MED ORDER — IBUPROFEN 200 MG PO TABS
600.0000 mg | ORAL_TABLET | Freq: Once | ORAL | Status: AC
  Administered 2018-01-12: 600 mg via ORAL
  Filled 2018-01-12: qty 3

## 2018-01-12 MED ORDER — BACITRACIN ZINC 500 UNIT/GM EX OINT
TOPICAL_OINTMENT | CUTANEOUS | Status: AC
  Filled 2018-01-12: qty 0.9

## 2018-01-12 NOTE — ED Triage Notes (Signed)
Patient was walking outside and fell head first. Laceration to the forehead. Patient takes blood thinners every 3rd day. Patient denies any other pain. Patient is alert and oriented denies LOC.

## 2018-01-12 NOTE — Discharge Instructions (Signed)
Keep your wound cleaned with water and soap, place and layer of antibiotic ointment at least once daily. Take Tylenol for pain. Continue all home medications. Sutures need to be removed in 3-5 days. Monitor for signs of infection including redness, swelling, warmth, pus, fevers. Our blood pressure was elevated in the emergency department today, I think this is from the pain from fall. Monitor your blood pressure follow-up with primary care doctor for reevaluation for this in the next week.

## 2018-01-12 NOTE — ED Provider Notes (Signed)
[facility] DEPT Provider Note   CSN: [id number] Arrival date & time: 01/12/18  7867     History   Chief Complaint No chief complaint on file.   HPI Nicholas Porter is a 84 y.o. male with history of hypertension, CAD with MI and stents, anemia, renal insufficiency here for evaluation of laceration to right forehead sustained after fall this morning. Patient states he was taking out the trash, turned around and tripped falling forward landing on his face. He was able to stand up and clean himself up. He drove 1 mile to his wife's job and came to ED for evaluation. He denies prodromal symptoms before fall including chest pain, shortness of breath, dizziness, palpitations, lightheadedness. Currently reports mild headache to area of the laceration but denies neck pain, dizziness, vision changes, nausea, vomiting, numbness or weakness distally. Takes aspirin but no other anticoagulants.  HPI  Past Medical History:  Diagnosis Date  . Anemia of chronic renal failure, stage 4 (severe) ([facility]) 11/27/2015  . 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 CAD S/P percutaneous coronary angioplasty - DES in RCA with PTCA for ISR; Moderate mLAD, 80% ostial OM2 stable 05/22/2008   Qualifier: Diagnosis of  By: Linda Hedges MD, Heinz Knuckles   . CKD (chronic kidney disease) stage 3, GFR 30-59 ml/min ([place])   . Essential hypertension 06/30/2007   Qualifier: Diagnosis of  By: Cori Razor RN, Mikal Plane    . H/O Inferior STEMI (09/2013) emergent PCI with Promus DES to RCA (3.0 mm  x 28 mm - 3.2 mm) 10/05/2013  . Left-sided carotid artery disease -- s/p CEA 04/06/2006   S/p Left CEA   . Mitral regurgitation - onexam 09/03/2016    Patient Active Problem List   Diagnosis Date Noted  . Psychiatric disturbance 04/15/2017  . Orthostatic hypotension 04/08/2017  . Klebsiella sepsis ([place]) 04/02/2017  . Acute renal failure  superimposed on stage 3 chronic kidney disease ([place]) 04/02/2017  . Acute metabolic encephalopathy 67/20/9470  . Benign prostatic hyperplasia 03/11/2017  . Weakness   . Mitral regurgitation - on exam 09/03/2016  . Malnutrition of moderate degree 11/29/2015  . Acute renal failure superimposed on stage 4 chronic kidney disease ([place]) 11/27/2015  . Anemia of chronic renal failure, stage 4 (severe) ([place]) 11/27/2015  . Protein-calorie malnutrition, severe ([place]) 11/27/2015  . GERD (gastroesophageal reflux disease) 01/24/2015  . Thrombocytopenia ([place]) 08/09/2014  . H/O Inferior STEMI (09/2013) emergent PCI with Promus DES to RCA (3.0 mm  x 28 mm - 3.2 mm) 10/05/2013  . 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 CAD S/P percutaneous coronary angioplasty - DES in RCA with PTCA for ISR; Moderate mLAD, 80% ostial OM2 stable 05/22/2008  . Essential hypertension 06/30/2007  . Left-sided carotid artery disease -- s/p CEA 04/06/2006    Class: History of    Past Surgical History:  Procedure Laterality Date  . AMPUTATION     left index finger -tramatic  . CARDIAC CATHETERIZATION  November 2011   40% mid LAD, 50% proximal RCA, 30-40% distal RCA (DOMINANT RCA with wraparound PDA & 2 large PLBs), 60-70% ostial OM1. No change from 2001. Medical therapy  . CAROTID ENDARTERECTOMY Left 2007   Dr.Hayes: 08/2016 Dopplers: Stable less than 40% right internal carotid stenosis and stable moderate 40-60% left internal carotid stenosis. Patent vertebral and subclavian arteries.  . CARPAL TUNNEL RELEASE    . CORNEAL TRANSPLANT    .  CORONARY ANGIOPLASTY WITH STENT PLACEMENT  09/2013; 05/2014    d) Promus DES 3.0 mm x 28 mm (3.2 mm)  to RCA with STEMI; residual 70% mid-distal LAD, OM 270-80%. Medical management; b) PTCA of 75 % ISR , LAD lesion noted as ~40%  . FOOT SURGERY     left  . HERNIA REPAIR    . LEFT HEART CATHETERIZATION WITH CORONARY ANGIOGRAM N/A 10/05/2013   Procedure: LEFT HEART CATHETERIZATION WITH CORONARY  ANGIOGRAM;  Surgeon: Peter M Martinique, MD;  Location: [facility] CATH LAB;  Service: Cardiovascular;  RCA 100% - PCI,CxOM stable, LAD ~70% mid;  . LEFT HEART CATHETERIZATION WITH CORONARY ANGIOGRAM N/A 06/08/2014   Procedure: LEFT HEART CATHETERIZATION WITH CORONARY ANGIOGRAM;  Surgeon: Troy Sine, MD;  Location: [facility] CATH LAB;  Service: Cardiovascular;  UA 6/'15: 75% ISR in RCA - PTCA, LAD lesion ~40%, CxOM stable.    Marland Kitchen NM MYOVIEW LTD  03/2017    - Elevatred Troponin in setting of Sepsis -- DEMAND ISCHEMIA.  Normal perfusion. LVEF 51% with normal wall motion. This is a low risk study.  Marland Kitchen PARTIAL GASTRECTOMY    . PERCUTANEOUS CORONARY INTERVENTION-BALLOON ONLY  06/08/2014   Procedure: PERCUTANEOUS CORONARY INTERVENTION-BALLOON ONLY;  Surgeon: Troy Sine, MD;  Location: [facility] CATH LAB;  Service: Cardiovascular;;  . ROTATOR CUFF REPAIR     left  . SHOULDER ARTHROSCOPY WITH ROTATOR CUFF REPAIR AND SUBACROMIAL DECOMPRESSION Right 07/01/2013   Procedure: RIGHT SHOULDER ARTHROSCOPY WITH  SUBACROMIAL DECOMPRESSION/DISTAL CLAVICLE RESECTION/ROTATOR CUFF REPAIR ;  Surgeon: Marin Shutter, MD;  Location: [facility];  Service: Orthopedics;  Laterality: Right;  . SPINE SURGERY  1985   cervical laminectomy  . TOTAL KNEE ARTHROPLASTY    . TRANSTHORACIC ECHOCARDIOGRAM  09/'17; 4/'18   a) Normal EF 55-60%. Mild to moderate aortic stenosis with moderate regurgitation. Mild MR.;; b) Mild global reduction in LV systolic function: 16-10%; grade 1 DD - high LVEDP. Mild AS/AI/TR. Mild-Mod MR; moderately elevated PAP.  Marland Kitchen VAGOTOMY     partial gastrectomy       Home Medications    Prior to Admission medications   Medication Sig Start Date End Date Taking? Authorizing Provider  aspirin EC 81 MG tablet Take 1 tablet (81 mg total) by mouth daily. Patient taking differently: Take 81 mg by mouth every other day.  04/23/17  Yes Leonie Man, MD  brinzolamide (AZOPT) 1 % ophthalmic suspension Place 1 drop into the left eye every  12 (twelve) hours.    Yes [provider]  metoprolol succinate (TOPROL-XL) 25 MG 24 hr tablet Take 25 mg by mouth daily.   Yes [provider]  nitroGLYCERIN (NITROSTAT) 0.4 MG SL tablet DISSOLVE 1 TABLET UNDER THE TONGUE EVERY 5 MINUTES AS NEEDED FOR CHEST PAIN 08/21/17  Yes Leonie Man, MD  TRAVATAN Z 0.004 % SOLN ophthalmic solution Place 1 drop into both eyes at bedtime.  04/20/13  Yes [provider]    Family History Family History  Problem Relation Age of Onset  . Heart disease Sister        Heart Transplant   . Diabetes Sister     Social History Social History   Tobacco Use  . Smoking status: Former Smoker    Types: Cigarettes    Last attempt to quit: 12/23/1964    Years since quitting: 53.0  . Smokeless tobacco: Never Used  Substance Use Topics  . Alcohol use: No  . Drug use: No  Allergies   Amlodipine besylate; Hydralazine; Lipitor [atorvastatin]; Naproxen; Oxycodone-acetaminophen; and Isosorbide   Review of Systems Review of Systems  Skin: Positive for wound.  Neurological: Positive for headaches.  All other systems reviewed and are negative.    Physical Exam Updated Vital Signs BP (!) 204/61   Pulse (!) 52   Temp (!) 97.5 F (36.4 C) (Oral)   Resp 18   Ht 5\' 7"  (1.702 m)   Wt 49.9 kg (110 lb)   SpO2 100%   BMI 17.23 kg/m   Physical Exam  Constitutional: He is oriented to person, place, and time. He appears well-developed and well-nourished. No distress.  NAD.  HENT:  Head: Normocephalic and atraumatic.  Right Ear: External ear normal.  Left Ear: External ear normal.  Nose: Nose normal.  Mild tenderness or right forehead. No crepitus or tenderness to zygomatic, mandible, maxilla, nasal or scalp bones. Septum midline without hematoma. No intraoral or dental injury. No malocclusion.  Eyes: Conjunctivae and EOM are normal. No scleral icterus.  Neck: Normal range of motion. Neck supple.  No midline C-spine  tenderness or step-offs. Full passive range of motion of neck without pain.  Cardiovascular: Normal rate, regular rhythm, normal heart sounds and intact distal pulses.  No murmur heard. Hypertensive.No bradycardia or tachycardia. No lower extremity edema or calf tenderness. 2+ distal pulses bilaterally.  Pulmonary/Chest: Effort normal and breath sounds normal. He has no wheezes.  Abdominal: Soft. There is no tenderness.  Musculoskeletal: Normal range of motion. He exhibits no deformity.  No focal bony tenderness to upper and lower extremities, midline CTL spine, hips. Full and painless passive range of motion of upper and lower extremities and hips.  Neurological: He is alert and oriented to person, place, and time.  Alert and oriented to self, place, time and event.  Speech is fluent without aphasia. Strength 5/5 with hand grip and ankle F/E.   Sensation to light touch intact in hands and feet. Normal gait. No pronator drift.  Normal finger-to-nose and heel-to-shin test.  CN I and VIII not tested. CN II-XII intact bilaterally.   Skin: Skin is warm and dry. Capillary refill takes less than 2 seconds. Laceration noted.  5 cm V-shaped laceration to right forehead with small amount of oozing blood.  Psychiatric: He has a normal mood and affect. His behavior is normal. Judgment and thought content normal.  Nursing note and vitals reviewed.    ED Treatments / Results  Labs (all labs ordered are listed, but only abnormal results are displayed) Labs Reviewed  CBG MONITORING, ED    EKG  EKG Interpretation  Date/Time:  Monday January 12 2018 10:59:18 EST Ventricular Rate:  60 PR Interval:    QRS Duration: 89 QT Interval:  432 QTC Calculation: 432 R Axis:   45 Text Interpretation:  Sinus rhythm Anterior infarct, old Baseline wander in lead(s) I III aVL V1 V2 V3 V4 Confirmed by Lacretia Leigh ([id number]) on 01/12/2018 12:52:08 PM       Radiology Ct Head Wo Contrast  Result Date:  01/12/2018 CLINICAL DATA:  Posttraumatic headache.  Initial encounter. EXAM: CT HEAD WITHOUT CONTRAST TECHNIQUE: Contiguous axial images were obtained from the base of the skull through the vertex without intravenous contrast. COMPARISON:  01/26/2012 FINDINGS: Brain: No evidence of acute infarction, hemorrhage, hydrocephalus, extra-axial collection or mass lesion/mass effect. Generalized atrophy. Mild chronic small vessel ischemic type change in the cerebral white matter. Vascular: Atherosclerotic calcification. Skull: Right frontal scalp contusion and laceration. No underlying fracture. Sinuses/Orbits:  No evidence of injury. Bilateral cataract resection. IMPRESSION: 1. No evidence of intracranial injury. 2. Right frontal scalp laceration without fracture. 3. Generalized atrophy. Electronically Signed   By: Monte Fantasia M.D.   On: 01/12/2018 11:20    Procedures .Marland KitchenLaceration Repair Date/Time: 01/12/2018 1:49 PM Performed by: Kinnie Feil, PA-C Authorized by: Kinnie Feil, PA-C   Consent:    Consent obtained:  Verbal   Consent given by:  Patient   Alternatives discussed:  Referral Anesthesia (see MAR for exact dosages):    Anesthesia method:  Local infiltration   Local anesthetic:  Lidocaine 2% WITH epi Laceration details:    Location:  Face   Face location:  Forehead   Length (cm):  5 Repair type:    Repair type:  Intermediate Pre-procedure details:    Preparation:  Patient was prepped and draped in usual sterile fashion and imaging obtained to evaluate for foreign bodies Exploration:    Hemostasis achieved with:  Epinephrine and direct pressure   Wound exploration: wound explored through full range of motion and entire depth of wound probed and visualized     Contaminated: yes (grass)   Treatment:    Area cleansed with:  Betadine, saline and Hibiclens   Amount of cleaning:  Extensive   Irrigation method:  Syringe, pressure wash and tap   Visualized foreign  bodies/material removed: yes   Skin repair:    Repair method:  Sutures   Suture size:  6-0   Suture technique:  Simple interrupted   Number of sutures:  10 Approximation:    Approximation:  Close   Vermilion border: well-aligned   Post-procedure details:    Dressing:  Antibiotic ointment   Patient tolerance of procedure:  Tolerated well, no immediate complications   (including critical care time)  Medications Ordered in ED Medications  lidocaine-EPINEPHrine (XYLOCAINE W/EPI) 2 %-1:200000 (PF) injection 20 mL (not administered)  bacitracin 500 UNIT/GM ointment (not administered)  Tdap (BOOSTRIX) injection 0.5 mL (0.5 mLs Intramuscular Given 01/12/18 1054)  acetaminophen (TYLENOL) tablet 500 mg (500 mg Oral Given 01/12/18 1057)  ibuprofen (ADVIL,MOTRIN) tablet 600 mg (600 mg Oral Given 01/12/18 1219)  traMADol (ULTRAM) tablet 50 mg (50 mg Oral Given 01/12/18 1219)     Initial Impression / Assessment and Plan / ED Course  I have reviewed the triage vital signs and the nursing notes.  Pertinent labs & imaging results that were available during my care of the patient were reviewed by me and considered in my medical decision making (see chart for details).    84 year old here for evaluation of laceration to forehead after mechanical fall. He denies prodromal symptoms before fall, loss of consciousness after. Reports mild headache. On exam he has set 5 cm laceration to the forehead. Neuro intact. No other obvious facial, neck or musculoskeletal injuries. He is on aspirin only. Given age will get CT head, EKG, CBG. Tdap given.  Final Clinical Impressions(s) / ED Diagnoses   Laceration was repaired in the ED without complications. Patient has history of hypertension and states his baseline is usually 706 systolic, he has been in the 200 to 2:15 range in the ED however he is having a mild headache.Sure patient with supervising physician, will defer further intervention for this at this time.   I've instructed patient to monitor his blood pressure over the next couple of days and see his primary care doctor for reevaluation of persistently elevated blood pressure.. Patient has been ambulatory without dizzinessin the ED. Tolerating oral  fluids. Will discharge at this time with wound care instructions and follow-up for suture removal in 5 days. Final diagnoses:  Fall, initial encounter  Laceration of forehead, initial encounter    ED Discharge Orders    None       Arlean Hopping 01/12/18 1354    Lacretia Leigh, MD 01/12/18 [phone]

## 2018-01-12 NOTE — ED Notes (Signed)
Patient ambulated in the room without assistance. patient denied any dizziness.

## 2018-01-15 ENCOUNTER — Encounter: Payer: Self-pay | Admitting: Adult Health

## 2018-01-15 ENCOUNTER — Ambulatory Visit (INDEPENDENT_AMBULATORY_CARE_PROVIDER_SITE_OTHER): Payer: Medicare Other | Admitting: Adult Health

## 2018-01-15 VITALS — BP 150/76 | Temp 97.9°F | Wt 110.0 lb

## 2018-01-15 DIAGNOSIS — Z4802 Encounter for removal of sutures: Secondary | ICD-10-CM

## 2018-01-15 NOTE — Progress Notes (Signed)
Subjective:    Patient ID: Nicholas Porter, male    DOB: [birth year], 84 y.o.   MRN: [id number]  HPI  84 year old male who  has a past medical history of Anemia of chronic renal failure, stage 4 (severe) ([place]) (11/27/2015), Blind loop syndrome (11/04/2008), CAD S/P percutaneous coronary angioplasty - DES in RCA with PTCA for ISR; Moderate mLAD, 80% ostial OM2 stable (05/22/2008), CKD (chronic kidney disease) stage 3, GFR 30-59 ml/min ([place]), Essential hypertension (06/30/2007), H/O Inferior STEMI (09/2013) emergent PCI with Promus DES to RCA (3.0 mm  x 28 mm - 3.2 mm) (10/05/2013), Left-sided carotid artery disease -- s/p CEA (04/06/2006), and Mitral regurgitation - onexam (09/03/2016).  He presents to the office today for follow up after being seen in the ER 3 days ago. Per Er note he was taking out the trash, he turned and tripped falling forward on his face.   CT scan of head showed  IMPRESSION: 1. No evidence of intracranial injury. 2. Right frontal scalp laceration without fracture. 3. Generalized atrophy.  He had 10 simple sutures placed.   Today in the office he reports that he has not experienced any headaches, dizziness or lightheadedness. He does reports feeling " a little more sleepy"   Review of Systems  Constitutional: Negative.   Eyes: Negative.   Respiratory: Negative.   Cardiovascular: Negative.   Skin: Positive for wound.  Neurological: Negative.   Hematological: Negative.   Psychiatric/Behavioral: Negative.   All other systems reviewed and are negative.  Past Medical History:  Diagnosis Date  . Anemia of chronic renal failure, stage 4 (severe) ([place]) 11/27/2015  . 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 CAD S/P percutaneous coronary angioplasty - DES in RCA with PTCA for ISR; Moderate mLAD, 80% ostial OM2 stable 05/22/2008   Qualifier: Diagnosis of  By: Linda Hedges MD, Heinz Knuckles   . CKD  (chronic kidney disease) stage 3, GFR 30-59 ml/min ([place])   . Essential hypertension 06/30/2007   Qualifier: Diagnosis of  By: Cori Razor RN, Mikal Plane    . H/O Inferior STEMI (09/2013) emergent PCI with Promus DES to RCA (3.0 mm  x 28 mm - 3.2 mm) 10/05/2013  . Left-sided carotid artery disease -- s/p CEA 04/06/2006   S/p Left CEA   . Mitral regurgitation - onexam 09/03/2016    Social History   Socioeconomic History  . Marital status: Married    Spouse name: Not on file  . Number of children: Not on file  . Years of education: Not on file  . Highest education level: Not on file  Social Needs  . Financial resource strain: Not on file  . Food insecurity - worry: Not on file  . Food insecurity - inability: Not on file  . Transportation needs - medical: Not on file  . Transportation needs - non-medical: Not on file  Occupational History    Employer: RETIRED    Comment: Retired  Tobacco Use  . Smoking status: Former Smoker    Types: Cigarettes    Last attempt to quit: 12/23/1964    Years since quitting: 53.0  . Smokeless tobacco: Never Used  Substance and Sexual Activity  . Alcohol use: No  . Drug use: No  . Sexual activity: Not on file  Other Topics Concern  . Not on file  Social History Narrative   He is married with 2 children. He lives with his  wife.   Very active at home, prior to a STEMI, he could walk several miles without discomfort. Prior to his MI, he would use his push mower to mow his entire lawn.   He is a former smoker and quit back in 1966. He does not drink alcohol    Past Surgical History:  Procedure Laterality Date  . AMPUTATION     left index finger -tramatic  . CARDIAC CATHETERIZATION  November 2011   40% mid LAD, 50% proximal RCA, 30-40% distal RCA (DOMINANT RCA with wraparound PDA & 2 large PLBs), 60-70% ostial OM1. No change from 2001. Medical therapy  . CAROTID ENDARTERECTOMY Left 2007   Dr.Hayes: 08/2016 Dopplers: Stable less than 40% right internal carotid  stenosis and stable moderate 40-60% left internal carotid stenosis. Patent vertebral and subclavian arteries.  . CARPAL TUNNEL RELEASE    . CORNEAL TRANSPLANT    . CORONARY ANGIOPLASTY WITH STENT PLACEMENT  09/2013; 05/2014    d) Promus DES 3.0 mm x 28 mm (3.2 mm)  to RCA with STEMI; residual 70% mid-distal LAD, OM 270-80%. Medical management; b) PTCA of 75 % ISR , LAD lesion noted as ~40%  . FOOT SURGERY     left  . HERNIA REPAIR    . LEFT HEART CATHETERIZATION WITH CORONARY ANGIOGRAM N/A 10/05/2013   Procedure: LEFT HEART CATHETERIZATION WITH CORONARY ANGIOGRAM;  Surgeon: Peter M Martinique, MD;  Location[facility] CATH LAB;  Service: Cardiovascular;  RCA 100% - PCI,CxOM stable, LAD ~70% mid;  . LEFT HEART CATHETERIZATION WITH CORONARY ANGIOGRAM N/A 06/08/2014   Procedure: LEFT HEART CATHETERIZATION WITH CORONARY ANGIOGRAM;  Surgeon: Troy Sine, MD;  Location: [facility] CATH LAB;  Service: Cardiovascular;  UA 6/'15: 75% ISR in RCA - PTCA, LAD lesion ~40%, CxOM stable.    Marland Kitchen NM MYOVIEW LTD  03/2017    - Elevatred Troponin in setting of Sepsis -- DEMAND ISCHEMIA.  Normal perfusion. LVEF 51% with normal wall motion. This is a low risk study.  Marland Kitchen PARTIAL GASTRECTOMY    . PERCUTANEOUS CORONARY INTERVENTION-BALLOON ONLY  06/08/2014   Procedure: PERCUTANEOUS CORONARY INTERVENTION-BALLOON ONLY;  Surgeon: Troy Sine, MD;  Location: [facility] CATH LAB;  Service: Cardiovascular;;  . ROTATOR CUFF REPAIR     left  . SHOULDER ARTHROSCOPY WITH ROTATOR CUFF REPAIR AND SUBACROMIAL DECOMPRESSION Right 07/01/2013   Procedure: RIGHT SHOULDER ARTHROSCOPY WITH  SUBACROMIAL DECOMPRESSION/DISTAL CLAVICLE RESECTION/ROTATOR CUFF REPAIR ;  Surgeon: Marin Shutter, MD;  Location: [place];  Service: Orthopedics;  Laterality: Right;  . SPINE SURGERY  1985   cervical laminectomy  . TOTAL KNEE ARTHROPLASTY    . TRANSTHORACIC ECHOCARDIOGRAM  09/'17; 4/'18   a) Normal EF 55-60%. Mild to moderate aortic stenosis with moderate regurgitation. Mild  MR.;; b) Mild global reduction in LV systolic function: 89-21%; grade 1 DD - high LVEDP. Mild AS/AI/TR. Mild-Mod MR; moderately elevated PAP.  Marland Kitchen VAGOTOMY     partial gastrectomy    Family History  Problem Relation Age of Onset  . Heart disease Sister        Heart Transplant   . Diabetes Sister     Allergies  Allergen Reactions  . Amlodipine Besylate Hives  . Hydralazine Itching  . Lipitor [Atorvastatin] Itching  . Naproxen Diarrhea  . Oxycodone-Acetaminophen Itching  . Isosorbide Rash    Current Outpatient Medications on File Prior to Visit  Medication Sig Dispense Refill  . aspirin EC 81 MG tablet Take 1 tablet (81 mg total) by mouth daily. (Patient  taking differently: Take 81 mg by mouth every other day. ) 90 tablet 3  . brinzolamide (AZOPT) 1 % ophthalmic suspension Place 1 drop into the left eye every 12 (twelve) hours.     . metoprolol succinate (TOPROL-XL) 25 MG 24 hr tablet Take 25 mg by mouth daily.    . nitroGLYCERIN (NITROSTAT) 0.4 MG SL tablet DISSOLVE 1 TABLET UNDER THE TONGUE EVERY 5 MINUTES AS NEEDED FOR CHEST PAIN 25 tablet 12  . TRAVATAN Z 0.004 % SOLN ophthalmic solution Place 1 drop into both eyes at bedtime.      No current facility-administered medications on file prior to visit.     BP (!) 164/60   Temp 97.9 F (36.6 C) (Oral)   Wt 110 lb (49.9 kg)   BMI 17.23 kg/m       Objective:   Physical Exam  Constitutional: He is oriented to person, place, and time. He appears well-developed and well-nourished. No distress.  Cardiovascular: Normal rate, regular rhythm, normal heart sounds and intact distal pulses. Exam reveals no gallop and no friction rub.  No murmur heard. Pulmonary/Chest: Effort normal and breath sounds normal. No respiratory distress. He has no wheezes. He has no rales. He exhibits no tenderness.  Neurological: He is alert and oriented to person, place, and time.  Skin: Skin is warm and dry. He is not diaphoretic.  Has well healed  laceration to right forehead. 10 sutures present   Psychiatric: He has a normal mood and affect. His behavior is normal. Judgment and thought content normal.  Nursing note and vitals reviewed.     Assessment & Plan:  1. Visit for suture removal - Wound is surprisingly well healed.  - 10 sutures removed without difficulty  - Patient tolerated procedure well   Dorothyann Peng, NP

## 2018-01-20 ENCOUNTER — Encounter: Payer: Self-pay | Admitting: Physician Assistant

## 2018-01-20 ENCOUNTER — Ambulatory Visit (INDEPENDENT_AMBULATORY_CARE_PROVIDER_SITE_OTHER): Payer: Medicare Other | Admitting: Physician Assistant

## 2018-01-20 VITALS — BP 122/64 | HR 64 | Ht 67.0 in | Wt 109.4 lb

## 2018-01-20 DIAGNOSIS — E875 Hyperkalemia: Secondary | ICD-10-CM | POA: Diagnosis not present

## 2018-01-20 DIAGNOSIS — E782 Mixed hyperlipidemia: Secondary | ICD-10-CM | POA: Diagnosis not present

## 2018-01-20 DIAGNOSIS — N183 Chronic kidney disease, stage 3 unspecified: Secondary | ICD-10-CM

## 2018-01-20 DIAGNOSIS — I739 Peripheral vascular disease, unspecified: Secondary | ICD-10-CM

## 2018-01-20 DIAGNOSIS — I2510 Atherosclerotic heart disease of native coronary artery without angina pectoris: Secondary | ICD-10-CM | POA: Diagnosis not present

## 2018-01-20 DIAGNOSIS — I779 Disorder of arteries and arterioles, unspecified: Secondary | ICD-10-CM | POA: Diagnosis not present

## 2018-01-20 DIAGNOSIS — I10 Essential (primary) hypertension: Secondary | ICD-10-CM

## 2018-01-20 NOTE — Progress Notes (Signed)
Cardiology Office Note    Date:  01/22/2018   ID:  Nicholas Porter, DOB 1930-06-26, MRN [id number]  PCP:  Dorothyann Peng, NP  Cardiologist:  Dr. Ellyn Hack Primary nephrologist: Dr. Justin Mend  Chief Complaint  Patient presents with  . Follow-up    seen for Dr. Ellyn Hack.     History of Present Illness:  Nicholas Porter is a 84 y.o. male with PMH of CAD, h/o partial gastrectomy with Bilroth I gastroenterostomy and blind loop syndrome, CKD stage III, HTN, carotid artery disease s/p L CEA. He had a history of inferior STEMI in October 2014 with emergent PCI was Promus DES to RCA. He underwent CEA in April 2015. He later developed unstable angina in June 2015 and found to have in-stent restenosis treated with cutting balloon angioplasty, he had 80% stable ostial OM 2 lesion. Subsequent Myoview in September showed no ischemia or infarction. He was seen in April 2018 at which time he complained of dyspnea on exertion. Subsequent Myoview obtained on 03/27/2017 showed EF 51%, low risk study. 2-D echo obtained on 03/30/2017 showed EF 45-50%, grade 1 DD, mild AI/TR, mild to moderate MR. Unfortunately, he was later admitted in April with severe sepsis and Klebsiella UTI. He was severely dehydrated on initial presentation, after aggressive hydration, he later developed volume overload requiring diuresis. Due to severe anemia, his Plavix was stopped and he was instructed to restart aspirin a month later. During the last office visit, he was resumed on Toprol-XL, however based on phone conversation on 06/24/2017, each time he restarted on the Toprol-XL, he was having rash with itching.  Patient presents today for cardiology office visit along with his wife.  He denies any recent chest pain or significant shortness of breath.  Despite his advanced age he is actually quite active.  He did have a fall recently when he tripped and fell into a ditch while picking up the mail.  He denies any dizziness or imbalance issue.  The last  time he fell prior to this was over a year ago.  He is due for carotid ultrasound.   Past Medical History:  Diagnosis Date  . Anemia of chronic renal failure, stage 4 (severe) ([place]) 11/27/2015  . 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 CAD S/P percutaneous coronary angioplasty - DES in RCA with PTCA for ISR; Moderate mLAD, 80% ostial OM2 stable 05/22/2008   Qualifier: Diagnosis of  By: Linda Hedges MD, Heinz Knuckles   . CKD (chronic kidney disease) stage 3, GFR 30-59 ml/min ([place])   . Essential hypertension 06/30/2007   Qualifier: Diagnosis of  By: Cori Razor RN, Mikal Plane    . H/O Inferior STEMI (09/2013) emergent PCI with Promus DES to RCA (3.0 mm  x 28 mm - 3.2 mm) 10/05/2013  . Left-sided carotid artery disease -- s/p CEA 04/06/2006   S/p Left CEA   . Mitral regurgitation - onexam 09/03/2016    Past Surgical History:  Procedure Laterality Date  . AMPUTATION     left index finger -tramatic  . CARDIAC CATHETERIZATION  November 2011   40% mid LAD, 50% proximal RCA, 30-40% distal RCA (DOMINANT RCA with wraparound PDA & 2 large PLBs), 60-70% ostial OM1. No change from 2001. Medical therapy  . CAROTID ENDARTERECTOMY Left 2007   Dr.Hayes: 08/2016 Dopplers: Stable less than 40% right internal carotid stenosis and stable moderate 40-60% left internal carotid stenosis. Patent vertebral and subclavian arteries.  Marland Kitchen  CARPAL TUNNEL RELEASE    . CORNEAL TRANSPLANT    . CORONARY ANGIOPLASTY WITH STENT PLACEMENT  09/2013; 05/2014    d) Promus DES 3.0 mm x 28 mm (3.2 mm)  to RCA with STEMI; residual 70% mid-distal LAD, OM 270-80%. Medical management; b) PTCA of 75 % ISR , LAD lesion noted as ~40%  . FOOT SURGERY     left  . HERNIA REPAIR    . LEFT HEART CATHETERIZATION WITH CORONARY ANGIOGRAM N/A 10/05/2013   Procedure: LEFT HEART CATHETERIZATION WITH CORONARY ANGIOGRAM;  Surgeon: Peter M Martinique, MD;  Location: [facility] CATH LAB;  Service:  Cardiovascular;  RCA 100% - PCI,CxOM stable, LAD ~70% mid;  . LEFT HEART CATHETERIZATION WITH CORONARY ANGIOGRAM N/A 06/08/2014   Procedure: LEFT HEART CATHETERIZATION WITH CORONARY ANGIOGRAM;  Surgeon: Troy Sine, MD;  Location: [facility] CATH LAB;  Service: Cardiovascular;  UA 6/'15: 75% ISR in RCA - PTCA, LAD lesion ~40%, CxOM stable.    Marland Kitchen NM MYOVIEW LTD  03/2017    - Elevatred Troponin in setting of Sepsis -- DEMAND ISCHEMIA.  Normal perfusion. LVEF 51% with normal wall motion. This is a low risk study.  Marland Kitchen PARTIAL GASTRECTOMY    . PERCUTANEOUS CORONARY INTERVENTION-BALLOON ONLY  06/08/2014   Procedure: PERCUTANEOUS CORONARY INTERVENTION-BALLOON ONLY;  Surgeon: Troy Sine, MD;  Location: [facility] CATH LAB;  Service: Cardiovascular;;  . ROTATOR CUFF REPAIR     left  . SHOULDER ARTHROSCOPY WITH ROTATOR CUFF REPAIR AND SUBACROMIAL DECOMPRESSION Right 07/01/2013   Procedure: RIGHT SHOULDER ARTHROSCOPY WITH  SUBACROMIAL DECOMPRESSION/DISTAL CLAVICLE RESECTION/ROTATOR CUFF REPAIR ;  Surgeon: Marin Shutter, MD;  Location: [place];  Service: Orthopedics;  Laterality: Right;  . SPINE SURGERY  1985   cervical laminectomy  . TOTAL KNEE ARTHROPLASTY    . TRANSTHORACIC ECHOCARDIOGRAM  09/'17; 4/'18   a) Normal EF 55-60%. Mild to moderate aortic stenosis with moderate regurgitation. Mild MR.;; b) Mild global reduction in LV systolic function: 23-55%; grade 1 DD - high LVEDP. Mild AS/AI/TR. Mild-Mod MR; moderately elevated PAP.  Marland Kitchen VAGOTOMY     partial gastrectomy    Current Medications: Outpatient Medications Prior to Visit  Medication Sig Dispense Refill  . aspirin EC 81 MG tablet Take 1 tablet (81 mg total) by mouth daily. 90 tablet 3  . brinzolamide (AZOPT) 1 % ophthalmic suspension Place 1 drop into the left eye every 12 (twelve) hours.     . metoprolol succinate (TOPROL-XL) 25 MG 24 hr tablet Take 25 mg by mouth daily.    . nitroGLYCERIN (NITROSTAT) 0.4 MG SL tablet DISSOLVE 1 TABLET UNDER THE TONGUE  EVERY 5 MINUTES AS NEEDED FOR CHEST PAIN 25 tablet 12  . TRAVATAN Z 0.004 % SOLN ophthalmic solution Place 1 drop into both eyes at bedtime.      No facility-administered medications prior to visit.      Allergies:   Amlodipine besylate; Hydralazine; Lipitor [atorvastatin]; Naproxen; Oxycodone-acetaminophen; and Isosorbide   Social History   Socioeconomic History  . Marital status: Married    Spouse name: None  . Number of children: None  . Years of education: None  . Highest education level: None  Social Needs  . Financial resource strain: None  . Food insecurity - worry: None  . Food insecurity - inability: None  . Transportation needs - medical: None  . Transportation needs - non-medical: None  Occupational History    Employer: RETIRED    Comment: Retired  Tobacco Use  . Smoking status:  Former Smoker    Types: Cigarettes    Last attempt to quit: 12/23/1964    Years since quitting: 53.1  . Smokeless tobacco: Never Used  Substance and Sexual Activity  . Alcohol use: No  . Drug use: No  . Sexual activity: None  Other Topics Concern  . None  Social History Narrative   He is married with 2 children. He lives with his wife.   Very active at home, prior to a STEMI, he could walk several miles without discomfort. Prior to his MI, he would use his push mower to mow his entire lawn.   He is a former smoker and quit back in 1966. He does not drink alcohol     Family History:  The patient's family history includes Diabetes in his sister; Heart disease in his sister.   ROS:   Please see the history of present illness.    ROS All other systems reviewed and are negative.   PHYSICAL EXAM:   VS:  BP 122/64   Pulse 64   Ht 5\' 7"  (1.702 m)   Wt 109 lb 6.4 oz (49.6 kg)   SpO2 100%   BMI 17.13 kg/m    GEN: Well nourished, well developed, in no acute distress  HEENT: normal  Neck: no JVD, carotid bruits, or masses Cardiac: RRR; no murmurs, rubs, or gallops,no edema    Respiratory:  clear to auscultation bilaterally, normal work of breathing GI: soft, nontender, nondistended, + BS MS: no deformity or atrophy  Skin: warm and dry, no rash Neuro:  Alert and Oriented x 3, Strength and sensation are intact Psych: euthymic mood, full affect  Wt Readings from Last 3 Encounters:  01/20/18 109 lb 6.4 oz (49.6 kg)  01/15/18 110 lb (49.9 kg)  01/12/18 110 lb (49.9 kg)      Studies/Labs Reviewed:   EKG:  EKG is not ordered today.    Recent Labs: 04/04/2017: Magnesium 1.9 07/14/2017: ALT 16; TSH 4.450 07/15/2017: Hemoglobin 11.2; Platelets 106.0 07/30/2017: BUN 55; Creatinine, Ser 2.26; Potassium 5.6; Sodium 141   Lipid Panel    Component Value Date/Time   CHOL 122 07/20/2015 1033   TRIG 106.0 07/20/2015 1033   TRIG 91 10/07/2006 1055   HDL 37.90 (L) 07/20/2015 1033   CHOLHDL 3 07/20/2015 1033   VLDL 21.2 07/20/2015 1033   LDLCALC 63 07/20/2015 1033    Additional studies/ records that were reviewed today include:   Echo 03/30/2017 LV EF: 45% -   50%  ------------------------------------------------------------------- Indications:      Abnormal blood test 790.99.  ------------------------------------------------------------------- History:   PMH:  Chronic Kidney Disease. Acute Kidney Injury. Anemia. Elevated Troponin. Confusion. Carotid Artery Disease S/P CEA. Fever.  Coronary artery disease.  Risk factors:  Hypertension.   ------------------------------------------------------------------- Study Conclusions  - Left ventricle: The cavity size was normal. Wall thickness was   normal. Systolic function was mildly reduced. The estimated   ejection fraction was in the range of 45% to 50%. Diffuse   hypokinesis. Doppler parameters are consistent with abnormal left   ventricular relaxation (grade 1 diastolic dysfunction). Doppler   parameters are consistent with high ventricular filling pressure. - Aortic valve: Valve mobility was restricted.  There was mild   stenosis. There was mild regurgitation. Valve area (VTI): 1.58   cm^2. Valve area (Vmax): 1.5 cm^2. Valve area (Vmean): 1.41 cm^2. - Mitral valve: Calcified annulus. There was mild to moderate   regurgitation. - Pulmonary arteries: Systolic pressure was moderately increased.  PA peak pressure: 59 mm Hg (S).  Impressions:  - Mild global reduction in LV systolic function; grade 1 diastolic   dysfunction; elevated LV filling pressure; mild AS and AI; mild   to moderate MR; mild TR; moderately elevated pulmonary pressure.    CT head wo contrast 01/12/2018 IMPRESSION: 1. No evidence of intracranial injury. 2. Right frontal scalp laceration without fracture. 3. Generalized atrophy.   ASSESSMENT:    1. Coronary artery disease involving native coronary artery of native heart without angina pectoris   2. Left-sided carotid artery disease, unspecified type ([place])   3. Mixed hyperlipidemia   4. CKD (chronic kidney disease), stage III ([place])   5. Essential hypertension   6. Hyperkalemia      PLAN:  In order of problems listed above:  1. CAD: Continue aspirin.  Denies any anginal symptom.  2. Carotid artery disease: Status post left CEA, due for carotid ultrasound prior to next visit.  3. Hypertension: Blood pressure stable on Toprol-XL.  4. Hyperlipidemia: According to his wife, he self discontinued his statin after he ran out of medication.  He will need a fasting lipid panel next month.  If cholesterol is high, I will reinitiate statin.  5. CKD stage IV: Followed by Dr. Justin Mend, baseline creatinine 2.26  6. Hyperkalemia: Noted to be hyperkalemic on the last lab work 5 months ago by primary care provider, will defer to PCP    Medication Adjustments/Labs and Tests Ordered: Current medicines are reviewed at length with the patient today.  Concerns regarding medicines are outlined above.  Medication changes, Labs and Tests ordered today are listed in the Patient  Instructions below. Patient Instructions  Medication Instructions:  Your physician recommends that you continue on your current medications as directed. Please refer to the Current Medication list given to you today.  Labwork: Your physician recommends that you return for lab work in: within the next month Fasting-Lipid  Testing/Procedures: Your physician has requested that you have a carotid duplex. This test is an ultrasound of the carotid arteries in your neck. It looks at blood flow through these arteries that supply the brain with blood. Allow one hour for this exam. There are no restrictions or special instructions.  Follow-Up: Your physician wants you to follow-up in: 9-12 months with Dr Ellyn Hack. You will receive a reminder letter in the mail two months in advance. If you don't receive a letter, please call our office to schedule the follow-up appointment.  Any Other Special Instructions Will Be Listed Below (If Applicable). If you need a refill on your cardiac medications before your next appointment, please call your pharmacy.     Hilbert Corrigan, [place]  01/22/2018 6:20 AM    [address], [place], [place]  [id number] Phone: [phone]; Fax: [phone]

## 2018-01-20 NOTE — Patient Instructions (Signed)
Medication Instructions:  Your physician recommends that you continue on your current medications as directed. Please refer to the Current Medication list given to you today.  Labwork: Your physician recommends that you return for lab work in: within the next month Fasting-Lipid  Testing/Procedures: Your physician has requested that you have a carotid duplex. This test is an ultrasound of the carotid arteries in your neck. It looks at blood flow through these arteries that supply the brain with blood. Allow one hour for this exam. There are no restrictions or special instructions.  Follow-Up: Your physician wants you to follow-up in: 9-12 months with Dr Ellyn Hack. You will receive a reminder letter in the mail two months in advance. If you don't receive a letter, please call our office to schedule the follow-up appointment.  Any Other Special Instructions Will Be Listed Below (If Applicable). If you need a refill on your cardiac medications before your next appointment, please call your pharmacy.

## 2018-01-22 ENCOUNTER — Encounter: Payer: Self-pay | Admitting: Physician Assistant

## 2018-03-19 DIAGNOSIS — H401132 Primary open-angle glaucoma, bilateral, moderate stage: Secondary | ICD-10-CM | POA: Diagnosis not present

## 2018-03-24 DIAGNOSIS — N183 Chronic kidney disease, stage 3 (moderate): Secondary | ICD-10-CM | POA: Diagnosis not present

## 2018-03-24 DIAGNOSIS — E039 Hypothyroidism, unspecified: Secondary | ICD-10-CM | POA: Diagnosis not present

## 2018-03-24 DIAGNOSIS — D631 Anemia in chronic kidney disease: Secondary | ICD-10-CM | POA: Diagnosis not present

## 2018-03-24 DIAGNOSIS — N179 Acute kidney failure, unspecified: Secondary | ICD-10-CM | POA: Diagnosis not present

## 2018-03-24 LAB — BASIC METABOLIC PANEL
BUN: 68 — AB (ref 4–21)
CREATININE: 2.6 — AB (ref 0.6–1.3)
Glucose: 107
POTASSIUM: 6.1 — AB (ref 3.4–5.3)
Sodium: 138 (ref 137–147)

## 2018-03-24 LAB — CBC AND DIFFERENTIAL
HCT: 28 — AB (ref 41–53)
Hemoglobin: 9.3 — AB (ref 13.5–17.5)
PLATELETS: 125 — AB (ref 150–399)
WBC: 4.6

## 2018-03-25 DIAGNOSIS — E875 Hyperkalemia: Secondary | ICD-10-CM | POA: Diagnosis not present

## 2018-03-26 ENCOUNTER — Encounter: Payer: Self-pay | Admitting: Adult Health

## 2018-03-26 ENCOUNTER — Other Ambulatory Visit: Payer: Self-pay | Admitting: Adult Health

## 2018-03-26 ENCOUNTER — Ambulatory Visit (INDEPENDENT_AMBULATORY_CARE_PROVIDER_SITE_OTHER): Payer: Medicare Other | Admitting: Adult Health

## 2018-03-26 VITALS — BP 146/78 | Temp 97.7°F | Wt 102.0 lb

## 2018-03-26 DIAGNOSIS — I2510 Atherosclerotic heart disease of native coronary artery without angina pectoris: Secondary | ICD-10-CM | POA: Diagnosis not present

## 2018-03-26 DIAGNOSIS — R634 Abnormal weight loss: Secondary | ICD-10-CM

## 2018-03-26 DIAGNOSIS — R040 Epistaxis: Secondary | ICD-10-CM | POA: Diagnosis not present

## 2018-03-26 DIAGNOSIS — F320 Major depressive disorder, single episode, mild: Secondary | ICD-10-CM | POA: Diagnosis not present

## 2018-03-26 LAB — CBC WITH DIFFERENTIAL/PLATELET
BASOS ABS: 0 10*3/uL (ref 0.0–0.1)
BASOS PCT: 0.4 % (ref 0.0–3.0)
Eosinophils Absolute: 0.1 10*3/uL (ref 0.0–0.7)
Eosinophils Relative: 1.3 % (ref 0.0–5.0)
HCT: 25.7 % — ABNORMAL LOW (ref 39.0–52.0)
Hemoglobin: 8.7 g/dL — ABNORMAL LOW (ref 13.0–17.0)
LYMPHS ABS: 0.6 10*3/uL — AB (ref 0.7–4.0)
Lymphocytes Relative: 13 % (ref 12.0–46.0)
MCHC: 33.9 g/dL (ref 30.0–36.0)
MCV: 87.6 fl (ref 78.0–100.0)
MONOS PCT: 9.6 % (ref 3.0–12.0)
Monocytes Absolute: 0.5 10*3/uL (ref 0.1–1.0)
NEUTROS ABS: 3.7 10*3/uL (ref 1.4–7.7)
NEUTROS PCT: 75.7 % (ref 43.0–77.0)
PLATELETS: 94 10*3/uL — AB (ref 150.0–400.0)
RBC: 2.93 Mil/uL — ABNORMAL LOW (ref 4.22–5.81)
RDW: 17.3 % — AB (ref 11.5–15.5)
WBC: 4.9 10*3/uL (ref 4.0–10.5)

## 2018-03-26 LAB — BASIC METABOLIC PANEL
BUN: 67 mg/dL — ABNORMAL HIGH (ref 6–23)
CALCIUM: 9.1 mg/dL (ref 8.4–10.5)
CHLORIDE: 112 meq/L (ref 96–112)
CO2: 20 meq/L (ref 19–32)
CREATININE: 2.37 mg/dL — AB (ref 0.40–1.50)
GFR: 27.81 mL/min — ABNORMAL LOW (ref >60.00)
GLUCOSE: 88 mg/dL (ref 70–99)
Potassium: 4.6 mEq/L (ref 3.5–5.1)
Sodium: 141 mEq/L (ref 135–145)

## 2018-03-26 LAB — HEPATIC FUNCTION PANEL
ALBUMIN: 3.9 g/dL (ref 3.5–5.2)
ALT: 9 U/L (ref 0–53)
AST: 14 U/L (ref 0–37)
Alkaline Phosphatase: 59 U/L (ref 39–117)
BILIRUBIN DIRECT: 0.1 mg/dL (ref 0.0–0.3)
Total Bilirubin: 0.5 mg/dL (ref 0.2–1.2)
Total Protein: 6.5 g/dL (ref 6.0–8.3)

## 2018-03-26 LAB — PROTIME-INR
INR: 1 ratio (ref 0.8–1.0)
PROTHROMBIN TIME: 11.1 s (ref 9.6–13.1)

## 2018-03-26 LAB — TSH: TSH: 2.4 u[IU]/mL (ref 0.35–4.50)

## 2018-03-26 MED ORDER — MIRTAZAPINE 7.5 MG PO TABS: 7.5000 mg | ORAL_TABLET | Freq: Every day | ORAL | 1 refills | Status: DC

## 2018-03-26 NOTE — Telephone Encounter (Signed)
Ok for 90 days supply

## 2018-03-26 NOTE — Progress Notes (Signed)
Subjective:    Patient ID: Jenna Ardoin, male    DOB: [birth year], 84 y.o.   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   He presents to the office today for concern of weight loss. He has steadily ben losing weight. He reports that he is eating but does not eat a lot, and has never been someone that eats in excessive.  Wife who is with him today at this office visit reports that he does eat throughout the day but usually not food that is good for him.  He eats a lot of processed foods and eats a lot of candy bars   He has not had a lot of energy recently and " just not feeling right".   He reports no recent nausea, vomiting, or diarrhea. He has not noticed any fevers.  Denies any blood in stool  When asked about depression patient does endorse some degree of depression and his wife agrees with this.  Most of his depression stems from worrying about having to make house payments.  Patient did not go into great detail but apparently he had a put his house up for collateral in order to hire a lot of lawyer to deal with some issues with his daughter-in-law.  He denies any suicidal ideation  This morning he woke up with a bloody nose. He is taking ASA 81 mg 3-4 x a week.   Wt Readings from Last 3 Encounters:  03/26/18 102 lb (46.3 kg)  01/20/18 109 lb 6.4 oz (49.6 kg)  01/15/18 110 lb (49.9 kg)    Review of Systems See HPI   Past  Medical History:  Diagnosis Date  . Anemia of chronic renal failure, stage 4 (severe) ([place]) 11/27/2015  . 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 CAD S/P percutaneous coronary angioplasty - DES in RCA with PTCA for ISR; Moderate mLAD, 80% ostial OM2 stable 05/22/2008   Qualifier: Diagnosis of  By: Linda Hedges MD, Heinz Knuckles   . CKD (chronic kidney disease) stage 3, GFR 30-59 ml/min ([place])   . Essential hypertension 06/30/2007   Qualifier: Diagnosis of  By: Cori Razor RN, Mikal Plane    . H/O Inferior STEMI (09/2013) emergent PCI with Promus DES to RCA (3.0 mm  x 28 mm - 3.2 mm) 10/05/2013  . Left-sided carotid artery disease -- s/p CEA 04/06/2006   S/p Left CEA   . Mitral regurgitation - onexam 09/03/2016    Social History   Socioeconomic History  . Marital status: Married    Spouse name: Not on file  . Number of children: Not on file  . Years of education: Not on file  . Highest education level: Not on file  Occupational History    Employer: RETIRED    Comment: Retired  Scientific laboratory technician  . Financial resource strain: Not on file  . Food insecurity:    Worry:  Not on file    Inability: Not on file  . Transportation needs:    Medical: Not on file    Non-medical: Not on file  Tobacco Use  . Smoking status: Former Smoker    Types: Cigarettes    Last attempt to quit: 12/23/1964    Years since quitting: 53.2  . Smokeless tobacco: Never Used  Substance and Sexual Activity  . Alcohol use: No  . Drug use: No  . Sexual activity: Not on file  Lifestyle  . Physical activity:    Days per week: Not on file    Minutes per session: Not on file  . Stress: Not on file  Relationships  . Social connections:    Talks on phone: Not on file    Gets together: Not on file    Attends religious service: Not on file    Active member of club or organization: Not on file    Attends meetings of clubs or organizations: Not on file     Relationship status: Not on file  . Intimate partner violence:    Fear of current or ex partner: Not on file    Emotionally abused: Not on file    Physically abused: Not on file    Forced sexual activity: Not on file  Other Topics Concern  . Not on file  Social History Narrative   He is married with 2 children. He lives with his wife.   Very active at home, prior to a STEMI, he could walk several miles without discomfort. Prior to his MI, he would use his push mower to mow his entire lawn.   He is a former smoker and quit back in 1966. He does not drink alcohol    Past Surgical History:  Procedure Laterality Date  . AMPUTATION     left index finger -tramatic  . CARDIAC CATHETERIZATION  November 2011   40% mid LAD, 50% proximal RCA, 30-40% distal RCA (DOMINANT RCA with wraparound PDA & 2 large PLBs), 60-70% ostial OM1. No change from 2001. Medical therapy  . CAROTID ENDARTERECTOMY Left 2007   Dr.Hayes: 08/2016 Dopplers: Stable less than 40% right internal carotid stenosis and stable moderate 40-60% left internal carotid stenosis. Patent vertebral and subclavian arteries.  . CARPAL TUNNEL RELEASE    . CORNEAL TRANSPLANT    . CORONARY ANGIOPLASTY WITH STENT PLACEMENT  09/2013; 05/2014    d) Promus DES 3.0 mm x 28 mm (3.2 mm)  to RCA with STEMI; residual 70% mid-distal LAD, OM 270-80%. Medical management; b) PTCA of 75 % ISR , LAD lesion noted as ~40%  . FOOT SURGERY     left  . HERNIA REPAIR    . LEFT HEART CATHETERIZATION WITH CORONARY ANGIOGRAM N/A 10/05/2013   Procedure: LEFT HEART CATHETERIZATION WITH CORONARY ANGIOGRAM;  Surgeon: Peter M Martinique, MD;  Location: [facility] CATH LAB;  Service: Cardiovascular;  RCA 100% - PCI,CxOM stable, LAD ~70% mid;  . LEFT HEART CATHETERIZATION WITH CORONARY ANGIOGRAM N/A 06/08/2014   Procedure: LEFT HEART CATHETERIZATION WITH CORONARY ANGIOGRAM;  Surgeon: Troy Sine, MD;  Location[facility] CATH LAB;  Service: Cardiovascular;  UA 6/'15: 75% ISR in RCA - PTCA,  LAD lesion ~40%, CxOM stable.    Marland Kitchen NM MYOVIEW LTD  03/2017    - Elevatred Troponin in setting of Sepsis -- DEMAND ISCHEMIA.  Normal perfusion. LVEF 51% with normal wall motion. This is a low risk study.  Marland Kitchen PARTIAL GASTRECTOMY    . PERCUTANEOUS CORONARY INTERVENTION-BALLOON  ONLY  06/08/2014   Procedure: PERCUTANEOUS CORONARY INTERVENTION-BALLOON ONLY;  Surgeon: Troy Sine, MD;  Location: [facility] CATH LAB;  Service: Cardiovascular;;  . ROTATOR CUFF REPAIR     left  . SHOULDER ARTHROSCOPY WITH ROTATOR CUFF REPAIR AND SUBACROMIAL DECOMPRESSION Right 07/01/2013   Procedure: RIGHT SHOULDER ARTHROSCOPY WITH  SUBACROMIAL DECOMPRESSION/DISTAL CLAVICLE RESECTION/ROTATOR CUFF REPAIR ;  Surgeon: Marin Shutter, MD;  Location: [facility];  Service: Orthopedics;  Laterality: Right;  . SPINE SURGERY  1985   cervical laminectomy  . TOTAL KNEE ARTHROPLASTY    . TRANSTHORACIC ECHOCARDIOGRAM  09/'17; 4/'18   a) Normal EF 55-60%. Mild to moderate aortic stenosis with moderate regurgitation. Mild MR.;; b) Mild global reduction in LV systolic function: 54-62%; grade 1 DD - high LVEDP. Mild AS/AI/TR. Mild-Mod MR; moderately elevated PAP.  Marland Kitchen VAGOTOMY     partial gastrectomy    Family History  Problem Relation Age of Onset  . Heart disease Sister        Heart Transplant   . Diabetes Sister     Allergies  Allergen Reactions  . Amlodipine Besylate Hives  . Hydralazine Itching  . Lipitor [Atorvastatin] Itching  . Naproxen Diarrhea  . Oxycodone-Acetaminophen Itching  . Isosorbide Rash    Current Outpatient Medications on File Prior to Visit  Medication Sig Dispense Refill  . aspirin EC 81 MG tablet Take 1 tablet (81 mg total) by mouth daily. 90 tablet 3  . brinzolamide (AZOPT) 1 % ophthalmic suspension Place 1 drop into the left eye every 12 (twelve) hours.     . metoprolol succinate (TOPROL-XL) 25 MG 24 hr tablet Take 25 mg by mouth daily.    . nitroGLYCERIN (NITROSTAT) 0.4 MG SL tablet DISSOLVE 1 TABLET UNDER  THE TONGUE EVERY 5 MINUTES AS NEEDED FOR CHEST PAIN 25 tablet 12  . TRAVATAN Z 0.004 % SOLN ophthalmic solution Place 1 drop into both eyes at bedtime.      No current facility-administered medications on file prior to visit.     BP (!) 146/78   Temp 97.7 F (36.5 C) (Oral)   Wt 102 lb (46.3 kg)   BMI 15.98 kg/m       Objective:   Physical Exam  Constitutional: He is oriented to person, place, and time. He appears well-developed and well-nourished. He appears cachectic. No distress.  HENT:  Head: Normocephalic and atraumatic.  Right Ear: External ear normal.  Left Ear: External ear normal.  Nose: Nose lacerations (Small laceration noted in right nare) present.  Mouth/Throat: Oropharynx is clear and moist.  Eyes: Pupils are equal, round, and reactive to light. Conjunctivae and EOM are normal. Right eye exhibits no discharge. Left eye exhibits no discharge. No scleral icterus.  Neck: Normal range of motion. Neck supple. No thyromegaly present.  Cardiovascular: Normal rate, regular rhythm, normal heart sounds and intact distal pulses. Exam reveals no gallop and no friction rub.  No murmur heard. Pulmonary/Chest: Effort normal and breath sounds normal. No respiratory distress. He has no wheezes. He has no rales. He exhibits no tenderness.  Abdominal: Soft. Bowel sounds are normal. He exhibits no distension and no mass. There is no tenderness. There is no rebound and no guarding.  Musculoskeletal: Normal range of motion. He exhibits no edema, tenderness or deformity.  Lymphadenopathy:    He has no cervical adenopathy.  Neurological: He is alert and oriented to person, place, and time. He has normal reflexes. He displays normal reflexes. No cranial nerve deficit. He exhibits  normal muscle tone. Coordination normal.  Skin: Skin is warm and dry. No rash noted. He is not diaphoretic. No erythema. No pallor.  Psychiatric: He has a normal mood and affect. His behavior is normal. Judgment and  thought content normal.  Nursing note and vitals reviewed.     Assessment & Plan:  1. Weight loss -Has lost significant weight over the past few months.  Will check basic labs get a chest x-ray.  And start on Remeron and appetite stimulant and also to help with depression.  I would like to see him back in 2-3 weeks.  Will consider CT of abdomen and/or referral to GI once results have come back.  Advised to stop eating junk food and focus his meals around high quality protein.  He can drink Ensure throughout the day - Basic metabolic panel - CBC with Differential/Platelet - TSH - Hepatic function panel - Protime-INR - DG Chest 2 View; Future - mirtazapine (REMERON) 7.5 MG tablet; Take 1 tablet (7.5 mg total) by mouth at bedtime.  Dispense: 30 tablet; Refill: 1  2. Epistaxis  - Protime-INR  3. Depression, major, single episode, mild (H[facility])  - mirtazapine (REMERON) 7.5 MG tablet; Take 1 tablet (7.5 mg total) by mouth at bedtime.  Dispense: 30 tablet; Refill: 1  Dorothyann Peng, NP

## 2018-03-26 NOTE — Telephone Encounter (Signed)
Sent to the pharmacy by e-scribe. 

## 2018-03-26 NOTE — Patient Instructions (Signed)
I am going to get blood work on you and will send you for a chest x ray   I am going to right you for a prescription for Remeron, this will help with depression and will increase your appetitie.   I want you to increase foods high in protein   Also pick up some normal saline nasal spray at the drug store - this will help keep your nose wet and will help prevent bloody noses.

## 2018-03-26 NOTE — Telephone Encounter (Signed)
Nicholas Porter, pt is requesting a 90 days supply.  Please advise.

## 2018-03-27 ENCOUNTER — Ambulatory Visit (INDEPENDENT_AMBULATORY_CARE_PROVIDER_SITE_OTHER)
Admission: RE | Admit: 2018-03-27 | Discharge: 2018-03-27 | Disposition: A | Discharge status: Home / Self Care | Payer: Medicare Other | Source: Ambulatory Visit | Attending: Adult Health | Admitting: Adult Health

## 2018-03-27 ENCOUNTER — Other Ambulatory Visit (INDEPENDENT_AMBULATORY_CARE_PROVIDER_SITE_OTHER): Payer: Medicare Other

## 2018-03-27 DIAGNOSIS — J449 Chronic obstructive pulmonary disease, unspecified: Secondary | ICD-10-CM | POA: Diagnosis not present

## 2018-03-27 DIAGNOSIS — R634 Abnormal weight loss: Secondary | ICD-10-CM

## 2018-03-27 DIAGNOSIS — D649 Anemia, unspecified: Secondary | ICD-10-CM

## 2018-03-27 LAB — FOLATE: Folate: 14.9 ng/mL (ref >5.9)

## 2018-03-27 LAB — VITAMIN B12: Vitamin B-12: 215 pg/mL (ref 211–911)

## 2018-03-27 LAB — FERRITIN: Ferritin: 29.2 ng/mL (ref 22.0–322.0)

## 2018-03-27 LAB — IBC PANEL
Iron: 33 ug/dL — ABNORMAL LOW (ref 42–165)
SATURATION RATIOS: 8.1 % — AB (ref 20.0–50.0)
TRANSFERRIN: 292 mg/dL (ref 212.0–360.0)

## 2018-03-30 DIAGNOSIS — H43823 Vitreomacular adhesion, bilateral: Secondary | ICD-10-CM | POA: Diagnosis not present

## 2018-03-30 DIAGNOSIS — H353221 Exudative age-related macular degeneration, left eye, with active choroidal neovascularization: Secondary | ICD-10-CM | POA: Diagnosis not present

## 2018-03-30 DIAGNOSIS — H353211 Exudative age-related macular degeneration, right eye, with active choroidal neovascularization: Secondary | ICD-10-CM | POA: Diagnosis not present

## 2018-03-31 ENCOUNTER — Encounter: Payer: Self-pay | Admitting: Adult Health

## 2018-03-31 ENCOUNTER — Other Ambulatory Visit: Payer: Self-pay | Admitting: Family Medicine

## 2018-03-31 DIAGNOSIS — R634 Abnormal weight loss: Secondary | ICD-10-CM

## 2018-04-02 ENCOUNTER — Telehealth: Payer: Self-pay | Admitting: Family Medicine

## 2018-04-02 ENCOUNTER — Encounter: Payer: Self-pay | Admitting: Adult Health

## 2018-04-02 NOTE — Telephone Encounter (Signed)
Copied from [place] [id number]. Topic: Inquiry >> Apr 02, 2018 10:47 AM Vernona Rieger wrote: Reason for CRM: [facility] called and wanted to let [name] know that they have him scheduled for an appt on May 10th @ 11:15 with Dr Watt Climes

## 2018-04-06 ENCOUNTER — Encounter (HOSPITAL_COMMUNITY): Payer: Medicare Other

## 2018-04-14 ENCOUNTER — Encounter (HOSPITAL_COMMUNITY)
Admission: RE | Admit: 2018-04-14 | Discharge: 2018-04-14 | Disposition: A | Discharge status: Home / Self Care | Payer: Medicare Other | Source: Ambulatory Visit | Attending: Nephrology | Admitting: Nephrology

## 2018-04-14 ENCOUNTER — Encounter (HOSPITAL_COMMUNITY): Payer: Self-pay

## 2018-04-14 DIAGNOSIS — D631 Anemia in chronic kidney disease: Secondary | ICD-10-CM | POA: Insufficient documentation

## 2018-04-14 DIAGNOSIS — N189 Chronic kidney disease, unspecified: Secondary | ICD-10-CM | POA: Insufficient documentation

## 2018-04-14 MED ORDER — SODIUM CHLORIDE 0.9 % IV SOLN
510.0000 mg | INTRAVENOUS | Status: DC
  Administered 2018-04-14: 510 mg via INTRAVENOUS
  Filled 2018-04-14: qty 17

## 2018-04-14 MED ORDER — SODIUM CHLORIDE 0.9 % IV SOLN
INTRAVENOUS | Status: DC
  Administered 2018-04-14: 12:00:00 via INTRAVENOUS

## 2018-04-14 NOTE — Discharge Instructions (Signed)

## 2018-04-21 ENCOUNTER — Encounter (HOSPITAL_COMMUNITY): Payer: Self-pay

## 2018-04-21 ENCOUNTER — Encounter (HOSPITAL_COMMUNITY)
Admission: RE | Admit: 2018-04-21 | Discharge: 2018-04-21 | Disposition: A | Discharge status: Home / Self Care | Payer: Medicare Other | Source: Ambulatory Visit | Attending: Nephrology | Admitting: Nephrology

## 2018-04-21 DIAGNOSIS — N189 Chronic kidney disease, unspecified: Secondary | ICD-10-CM | POA: Diagnosis not present

## 2018-04-21 DIAGNOSIS — D631 Anemia in chronic kidney disease: Secondary | ICD-10-CM | POA: Diagnosis not present

## 2018-04-21 HISTORY — DX: Congenital malformation of eye, unspecified: Q15.9

## 2018-04-21 MED ORDER — SODIUM CHLORIDE 0.9 % IV SOLN
510.0000 mg | INTRAVENOUS | Status: AC
  Administered 2018-04-21: 510 mg via INTRAVENOUS
  Filled 2018-04-21: qty 17

## 2018-04-21 MED ORDER — CLONIDINE HCL 0.2 MG PO TABS: 0.2000 mg | ORAL_TABLET | ORAL | Status: DC

## 2018-04-21 MED ORDER — CLONIDINE HCL 0.1 MG PO TABS
0.1000 mg | ORAL_TABLET | Freq: Once | ORAL | Status: AC
  Administered 2018-04-21: 0.1 mg via ORAL
  Filled 2018-04-21 (×2): qty 1

## 2018-04-21 MED ORDER — CLONIDINE HCL 0.2 MG PO TABS
0.2000 mg | ORAL_TABLET | ORAL | Status: AC
  Administered 2018-04-21: 0.2 mg via ORAL
  Filled 2018-04-21: qty 1

## 2018-04-21 MED ORDER — SODIUM CHLORIDE 0.9 % IV SOLN
INTRAVENOUS | Status: AC
  Administered 2018-04-21: 12:00:00 via INTRAVENOUS

## 2018-04-21 NOTE — Discharge Instructions (Signed)
Ferumoxytol injection / Feraheme infusion What is this medicine? FERUMOXYTOL is an iron complex. Iron is used to make healthy red blood cells, which carry oxygen and nutrients throughout the body. This medicine is used to treat iron deficiency anemia in people with chronic kidney disease. This medicine may be used for other purposes; ask your health care provider or pharmacist if you have questions. COMMON BRAND NAME(S): Feraheme What should I tell my health care provider before I take this medicine? They need to know if you have any of these conditions: -anemia not caused by low iron levels -high levels of iron in the blood -magnetic resonance imaging (MRI) test scheduled -an unusual or allergic reaction to iron, other medicines, foods, dyes, or preservatives -pregnant or trying to get pregnant -breast-feeding How should I use this medicine? This medicine is for injection into a vein. It is given by a health care professional in a hospital or clinic setting. Talk to your pediatrician regarding the use of this medicine in children. Special care may be needed. Overdosage: If you think you have taken too much of this medicine contact a poison control center or emergency room at once. NOTE: This medicine is only for you. Do not share this medicine with others. What if I miss a dose? It is important not to miss your dose. Call your doctor or health care professional if you are unable to keep an appointment. What may interact with this medicine? This medicine may interact with the following medications: -other iron products This list may not describe all possible interactions. Give your health care provider a list of all the medicines, herbs, non-prescription drugs, or dietary supplements you use. Also tell them if you smoke, drink alcohol, or use illegal drugs. Some items may interact with your medicine. What should I watch for while using this medicine? Visit your doctor or healthcare  professional regularly. Tell your doctor or healthcare professional if your symptoms do not start to get better or if they get worse. You may need blood work done while you are taking this medicine. You may need to follow a special diet. Talk to your doctor. Foods that contain iron include: whole grains/cereals, dried fruits, beans, or peas, leafy green vegetables, and organ meats (liver, kidney). What side effects may I notice from receiving this medicine? Side effects that you should report to your doctor or health care professional as soon as possible: -allergic reactions like skin rash, itching or hives, swelling of the face, lips, or tongue -breathing problems -changes in blood pressure -feeling faint or lightheaded, falls -fever or chills -flushing, sweating, or hot feelings -swelling of the ankles or feet Side effects that usually do not require medical attention (report to your doctor or health care professional if they continue or are bothersome): -diarrhea -headache -nausea, vomiting -stomach pain This list may not describe all possible side effects. Call your doctor for medical advice about side effects. You may report side effects to FDA at 1-800-FDA-1088. Where should I keep my medicine? This drug is given in a hospital or clinic and will not be stored at home. NOTE: This sheet is a summary. It may not cover all possible information. If you have questions about this medicine, talk to your doctor, pharmacist, or health care provider.  2018 Elsevier/Gold Standard (2016-01-11 12:41:49)

## 2018-04-21 NOTE — Progress Notes (Signed)
Hypertension worse after infusion of Feraheme.  Dr Justin Mend notified through Roxy Horseman, assistant. Pt states he took his metoprolol XL this am.  See order for clonidine.

## 2018-04-21 NOTE — Progress Notes (Signed)
Pt discharged with BP 170/62 after 2 doses of clonidine, wife at side.  Pt and wife agree to follow up with BP this week and Dr Justin Mend as requested.

## 2018-04-22 ENCOUNTER — Other Ambulatory Visit: Payer: Self-pay

## 2018-04-22 DIAGNOSIS — I129 Hypertensive chronic kidney disease with stage 1 through stage 4 chronic kidney disease, or unspecified chronic kidney disease: Secondary | ICD-10-CM | POA: Diagnosis not present

## 2018-04-22 DIAGNOSIS — N183 Chronic kidney disease, stage 3 (moderate): Secondary | ICD-10-CM | POA: Diagnosis not present

## 2018-04-22 DIAGNOSIS — K219 Gastro-esophageal reflux disease without esophagitis: Secondary | ICD-10-CM | POA: Diagnosis not present

## 2018-04-22 DIAGNOSIS — N179 Acute kidney failure, unspecified: Secondary | ICD-10-CM | POA: Diagnosis not present

## 2018-04-22 DIAGNOSIS — E875 Hyperkalemia: Secondary | ICD-10-CM | POA: Diagnosis not present

## 2018-04-22 DIAGNOSIS — I340 Nonrheumatic mitral (valve) insufficiency: Secondary | ICD-10-CM | POA: Diagnosis not present

## 2018-04-22 DIAGNOSIS — I213 ST elevation (STEMI) myocardial infarction of unspecified site: Secondary | ICD-10-CM | POA: Diagnosis not present

## 2018-04-22 DIAGNOSIS — E872 Acidosis: Secondary | ICD-10-CM | POA: Diagnosis not present

## 2018-04-22 LAB — BASIC METABOLIC PANEL
BUN: 39 — AB (ref 4–21)
Creatinine: 1.9 — AB (ref 0.6–1.3)
Glucose: 86
Potassium: 5 (ref 3.4–5.3)
Sodium: 139 (ref 137–147)

## 2018-04-22 MED ORDER — METOPROLOL SUCCINATE ER 25 MG PO TB24: 25.0000 mg | ORAL_TABLET | Freq: Every day | ORAL | 6 refills | Status: DC

## 2018-04-23 ENCOUNTER — Telehealth: Payer: Self-pay | Admitting: Cardiology

## 2018-04-23 NOTE — Telephone Encounter (Signed)
NEW MESSAGE    Patient spouse calling to report recent blood pressure   Pt c/o BP issue: STAT if pt c/o blurred vision, one-sided weakness or slurred speech  1. What are your last 5 BP readings? Tuesday 21/64, Wed 190/?  2. Are you having any other symptoms (ex. Dizziness, headache, blurred vision, passed out)? NO  3. What is your BP issue? Spouse calling to report BP

## 2018-04-23 NOTE — Telephone Encounter (Signed)
Spoke with pt's wife and wife was told to call our office due to high b/p .Tues was 214/64 and Wed was 195/? Pt currently is anemic and has received 2 bags of iron and is taking a iron supplement qod.Instructed wife to keep b/p log and call Monday with additional readings.Will forward to Dr Ellyn Hack for review and recommendations ./cy

## 2018-04-23 NOTE — Telephone Encounter (Signed)
Those are pretty high pressures.  I am not sure how we can explain that besides may be the iron having an effect. My recommendation if he does have blood pressure that high would be to take an additional dose of Toprol. If he has SL NTG - use that as well - can do q 20 min x 2.  Glenetta Hew, MD

## 2018-04-24 NOTE — Telephone Encounter (Signed)
Left message to call back  

## 2018-04-27 NOTE — Telephone Encounter (Signed)
Spoke wife she states patient blood pressure today 144/60 ( taken [facility] fire dept[facility] yesterday it was 149/72 at [facility]  RN informed wife patient may take an extra Toprol XL 25 MG if blood pressure is elevated as last week in 200's and can use NTG  q 65min x 2 as Dr Ellyn Hack recommend. Wife verbalized understanding.

## 2018-04-28 ENCOUNTER — Other Ambulatory Visit: Payer: Self-pay | Admitting: Physician Assistant

## 2018-04-28 DIAGNOSIS — H353221 Exudative age-related macular degeneration, left eye, with active choroidal neovascularization: Secondary | ICD-10-CM | POA: Diagnosis not present

## 2018-04-28 DIAGNOSIS — H353231 Exudative age-related macular degeneration, bilateral, with active choroidal neovascularization: Secondary | ICD-10-CM | POA: Diagnosis not present

## 2018-04-28 DIAGNOSIS — H353211 Exudative age-related macular degeneration, right eye, with active choroidal neovascularization: Secondary | ICD-10-CM | POA: Diagnosis not present

## 2018-04-28 DIAGNOSIS — I6523 Occlusion and stenosis of bilateral carotid arteries: Secondary | ICD-10-CM

## 2018-04-29 DIAGNOSIS — N179 Acute kidney failure, unspecified: Secondary | ICD-10-CM | POA: Diagnosis not present

## 2018-04-29 DIAGNOSIS — N183 Chronic kidney disease, stage 3 (moderate): Secondary | ICD-10-CM | POA: Diagnosis not present

## 2018-04-29 DIAGNOSIS — I129 Hypertensive chronic kidney disease with stage 1 through stage 4 chronic kidney disease, or unspecified chronic kidney disease: Secondary | ICD-10-CM | POA: Diagnosis not present

## 2018-04-29 DIAGNOSIS — E875 Hyperkalemia: Secondary | ICD-10-CM | POA: Diagnosis not present

## 2018-04-29 DIAGNOSIS — K219 Gastro-esophageal reflux disease without esophagitis: Secondary | ICD-10-CM | POA: Diagnosis not present

## 2018-04-29 DIAGNOSIS — I213 ST elevation (STEMI) myocardial infarction of unspecified site: Secondary | ICD-10-CM | POA: Diagnosis not present

## 2018-04-29 DIAGNOSIS — I340 Nonrheumatic mitral (valve) insufficiency: Secondary | ICD-10-CM | POA: Diagnosis not present

## 2018-04-30 ENCOUNTER — Encounter: Payer: Self-pay | Admitting: Family Medicine

## 2018-05-01 ENCOUNTER — Other Ambulatory Visit: Payer: Self-pay | Admitting: Gastroenterology

## 2018-05-01 DIAGNOSIS — R1084 Generalized abdominal pain: Secondary | ICD-10-CM

## 2018-05-01 DIAGNOSIS — D508 Other iron deficiency anemias: Secondary | ICD-10-CM | POA: Diagnosis not present

## 2018-05-01 DIAGNOSIS — R932 Abnormal findings on diagnostic imaging of liver and biliary tract: Secondary | ICD-10-CM | POA: Diagnosis not present

## 2018-05-01 DIAGNOSIS — R634 Abnormal weight loss: Secondary | ICD-10-CM | POA: Diagnosis not present

## 2018-05-07 ENCOUNTER — Ambulatory Visit
Admission: RE | Admit: 2018-05-07 | Discharge: 2018-05-07 | Disposition: A | Discharge status: Home / Self Care | Payer: Medicare Other | Source: Ambulatory Visit | Attending: Gastroenterology | Admitting: Gastroenterology

## 2018-05-07 DIAGNOSIS — K8020 Calculus of gallbladder without cholecystitis without obstruction: Secondary | ICD-10-CM | POA: Diagnosis not present

## 2018-05-07 DIAGNOSIS — R1084 Generalized abdominal pain: Secondary | ICD-10-CM

## 2018-05-15 DIAGNOSIS — H401132 Primary open-angle glaucoma, bilateral, moderate stage: Secondary | ICD-10-CM | POA: Diagnosis not present

## 2018-05-27 DIAGNOSIS — H353221 Exudative age-related macular degeneration, left eye, with active choroidal neovascularization: Secondary | ICD-10-CM | POA: Diagnosis not present

## 2018-05-27 DIAGNOSIS — H353231 Exudative age-related macular degeneration, bilateral, with active choroidal neovascularization: Secondary | ICD-10-CM | POA: Diagnosis not present

## 2018-05-27 DIAGNOSIS — H353211 Exudative age-related macular degeneration, right eye, with active choroidal neovascularization: Secondary | ICD-10-CM | POA: Diagnosis not present

## 2018-07-10 DIAGNOSIS — H401132 Primary open-angle glaucoma, bilateral, moderate stage: Secondary | ICD-10-CM | POA: Diagnosis not present

## 2018-07-15 DIAGNOSIS — H353221 Exudative age-related macular degeneration, left eye, with active choroidal neovascularization: Secondary | ICD-10-CM | POA: Diagnosis not present

## 2018-07-15 DIAGNOSIS — H353231 Exudative age-related macular degeneration, bilateral, with active choroidal neovascularization: Secondary | ICD-10-CM | POA: Diagnosis not present

## 2018-07-15 DIAGNOSIS — H353211 Exudative age-related macular degeneration, right eye, with active choroidal neovascularization: Secondary | ICD-10-CM | POA: Diagnosis not present

## 2018-07-20 ENCOUNTER — Ambulatory Visit (HOSPITAL_COMMUNITY)
Admission: RE | Admit: 2018-07-20 | Discharge: 2018-07-20 | Disposition: A | Discharge status: Home / Self Care | Payer: Medicare Other | Source: Ambulatory Visit | Attending: Internal Medicine | Admitting: Internal Medicine

## 2018-07-20 DIAGNOSIS — I6523 Occlusion and stenosis of bilateral carotid arteries: Secondary | ICD-10-CM | POA: Insufficient documentation

## 2018-07-21 NOTE — Progress Notes (Signed)
Mild disease in both carotid artery. Nothing need to be fixed. Continue observation

## 2018-09-01 DIAGNOSIS — I340 Nonrheumatic mitral (valve) insufficiency: Secondary | ICD-10-CM | POA: Diagnosis not present

## 2018-09-01 DIAGNOSIS — N2581 Secondary hyperparathyroidism of renal origin: Secondary | ICD-10-CM | POA: Diagnosis not present

## 2018-09-01 DIAGNOSIS — N183 Chronic kidney disease, stage 3 (moderate): Secondary | ICD-10-CM | POA: Diagnosis not present

## 2018-09-01 DIAGNOSIS — R634 Abnormal weight loss: Secondary | ICD-10-CM | POA: Diagnosis not present

## 2018-09-01 DIAGNOSIS — E875 Hyperkalemia: Secondary | ICD-10-CM | POA: Diagnosis not present

## 2018-09-01 DIAGNOSIS — N179 Acute kidney failure, unspecified: Secondary | ICD-10-CM | POA: Diagnosis not present

## 2018-09-01 DIAGNOSIS — I129 Hypertensive chronic kidney disease with stage 1 through stage 4 chronic kidney disease, or unspecified chronic kidney disease: Secondary | ICD-10-CM | POA: Diagnosis not present

## 2018-09-01 DIAGNOSIS — K219 Gastro-esophageal reflux disease without esophagitis: Secondary | ICD-10-CM | POA: Diagnosis not present

## 2018-09-01 DIAGNOSIS — D631 Anemia in chronic kidney disease: Secondary | ICD-10-CM | POA: Diagnosis not present

## 2018-09-01 DIAGNOSIS — I213 ST elevation (STEMI) myocardial infarction of unspecified site: Secondary | ICD-10-CM | POA: Diagnosis not present

## 2018-09-01 DIAGNOSIS — I779 Disorder of arteries and arterioles, unspecified: Secondary | ICD-10-CM | POA: Diagnosis not present

## 2018-09-08 ENCOUNTER — Encounter: Payer: Self-pay | Admitting: Adult Health

## 2018-09-08 ENCOUNTER — Ambulatory Visit (INDEPENDENT_AMBULATORY_CARE_PROVIDER_SITE_OTHER): Payer: Medicare Other | Admitting: Adult Health

## 2018-09-08 DIAGNOSIS — F320 Major depressive disorder, single episode, mild: Secondary | ICD-10-CM

## 2018-09-08 DIAGNOSIS — I6523 Occlusion and stenosis of bilateral carotid arteries: Secondary | ICD-10-CM

## 2018-09-08 DIAGNOSIS — R634 Abnormal weight loss: Secondary | ICD-10-CM | POA: Diagnosis not present

## 2018-09-08 MED ORDER — MIRTAZAPINE 7.5 MG PO TABS: ORAL_TABLET | ORAL | 0 refills | Status: DC

## 2018-09-08 NOTE — Progress Notes (Signed)
Subjective:    Patient ID: Nicholas Porter, male    DOB: [birth year], 84 y.o.   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  He presents to the office today for weight loss. He was seen for this issue back in April 2019. Labs were WNL except for iron panel which showed anemia. Chest xray showed COPD without acute abnormality. He was seen by his GI ( Dr. Watt Climes) who did an abdominal CT that showed   IMPRESSION: 1. No acute abnormality. Stable postsurgical changes from [place] 1 procedure. No evidence of bowel obstruction or acute bowel inflammation. Marked sigmoid diverticulosis, with no evidence of acute diverticulitis. 2. Cholelithiasis. No noncontrast CT evidence of acute cholecystitis. 3. Stable mild prostatomegaly. 4.  Aortic Atherosclerosis (ICD10-I70.0).  Coronary atherosclerosis.   Wt Readings from Last 3 Encounters:  09/08/18 98 lb (44.5 kg)  04/21/18 107 lb 9.6 oz (48.8 kg)  04/14/18 108 lb (49 kg)   At this time there was some concern for depression due to family issues. He was prescribed Remeron 7.5 to help with depression and act as an appetite stimulant.   He reports that he never started Remeron.   Today he continues to have weight loss. His diet continues to be poor, eats a lot of processed foods and candy. He has tried using Ensure but this  caused diarrhea.   He has not followed up with GI  He denies any n/v/d, chest pain, SOB, or feeling acutely ill.   He does endorse continuing to feel depressed but this is now due more to vision loss from macular degeneration.   Review of Systems See HPI  Past Medical History:  Diagnosis Date  . Anemia of chronic renal failure, stage 4 (severe) ([place]) 11/27/2015  . 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 CAD S/P percutaneous coronary angioplasty - DES in RCA with PTCA for ISR; Moderate mLAD, 80% ostial OM2 stable 05/22/2008   Qualifier: Diagnosis of  By: Linda Hedges MD, Heinz Knuckles   . CKD (chronic kidney disease) stage 3, GFR 30-59 ml/min ([place])   . COPD (chronic obstructive pulmonary disease) ([place])   . Essential hypertension 06/30/2007   Qualifier: Diagnosis of  By: Cori Razor RN, Mikal Plane Eye abnormalities    injections in eyes 12/2017  . H/O Inferior STEMI (09/2013) emergent PCI with Promus DES to RCA (3.0 mm  x 28 mm - 3.2 mm) 10/05/2013  . Left-sided carotid artery disease -- s/p CEA 04/06/2006   S/p Left CEA   . Mitral regurgitation - onexam 09/03/2016    Social History   Socioeconomic History  . Marital status: Married    Spouse name: Not on file  . Number of children: Not on file  . Years of education: Not on file  . Highest education level: Not on file  Occupational  History    Employer: RETIRED    Comment: Retired  Scientific laboratory technician  . Financial resource strain: Not on file  . Food insecurity:    Worry: Not on file    Inability: Not on file  . Transportation needs:    Medical: Not on file    Non-medical: Not on file  Tobacco Use  . Smoking status: Former Smoker    Types: Cigarettes    Last attempt to quit: 12/23/1964    Years since quitting: 53.7  . Smokeless tobacco: Never Used  Substance and Sexual Activity  . Alcohol use: No  . Drug use: No  . Sexual activity: Not on file  Lifestyle   . Physical activity:    Days per week: Not on file    Minutes per session: Not on file  . Stress: Not on file  Relationships  . Social connections:    Talks on phone: Not on file    Gets together: Not on file    Attends religious service: Not on file    Active member of club or organization: Not on file    Attends meetings of clubs or organizations: Not on file    Relationship status: Not on file  . Intimate partner violence:    Fear of current or ex partner: Not on file    Emotionally abused: Not on file    Physically abused: Not on file    Forced sexual activity: Not on file  Other Topics Concern  . Not on file  Social History Narrative   He is married with 2 children. He lives with his wife.   Very active at home, prior to a STEMI, he could walk several miles without discomfort. Prior to his MI, he would use his push mower to mow his entire lawn.   He is a former smoker and quit back in 1966. He does not drink alcohol    Past Surgical History:  Procedure Laterality Date  . AMPUTATION     left index finger -tramatic  . CARDIAC CATHETERIZATION  November 2011   40% mid LAD, 50% proximal RCA, 30-40% distal RCA (DOMINANT RCA with wraparound PDA & 2 large PLBs), 60-70% ostial OM1. No change from 2001. Medical therapy  . CAROTID ENDARTERECTOMY Left 2007   Dr.Hayes: 08/2016 Dopplers: Stable less than 40% right internal carotid stenosis and stable moderate 40-60% left internal carotid stenosis. Patent vertebral and subclavian arteries.  . CARPAL TUNNEL RELEASE    . CORNEAL TRANSPLANT    . CORONARY ANGIOPLASTY WITH STENT PLACEMENT  09/2013; 05/2014    d) Promus DES 3.0 mm x 28 mm (3.2 mm)  to RCA with STEMI; residual 70% mid-distal LAD, OM 270-80%. Medical management; b) PTCA of 75 % ISR , LAD lesion noted as ~40%  . FOOT SURGERY     left  . HERNIA REPAIR    . LEFT HEART CATHETERIZATION WITH CORONARY ANGIOGRAM N/A 10/05/2013   Procedure: LEFT HEART CATHETERIZATION WITH CORONARY  ANGIOGRAM;  Surgeon: Peter M Martinique, MD;  Location: [facility] CATH LAB;  Service: Cardiovascular;  RCA 100% - PCI,CxOM stable, LAD ~70% mid;  . LEFT HEART CATHETERIZATION WITH CORONARY ANGIOGRAM N/A 06/08/2014   Procedure: LEFT HEART CATHETERIZATION WITH CORONARY ANGIOGRAM;  Surgeon: Troy Sine, MD;  Location: [facility] CATH LAB;  Service: Cardiovascular;  UA 6/'15: 75% ISR in RCA - PTCA, LAD lesion ~40%, CxOM stable.    Marland Kitchen NM MYOVIEW LTD  03/2017    - Elevatred Troponin in setting of  Sepsis -- DEMAND ISCHEMIA.  Normal perfusion. LVEF 51% with normal wall motion. This is a low risk study.  Marland Kitchen PARTIAL GASTRECTOMY    . PERCUTANEOUS CORONARY INTERVENTION-BALLOON ONLY  06/08/2014   Procedure: PERCUTANEOUS CORONARY INTERVENTION-BALLOON ONLY;  Surgeon: Troy Sine, MD;  Location: [facility] CATH LAB;  Service: Cardiovascular;;  . ROTATOR CUFF REPAIR     left  . SHOULDER ARTHROSCOPY WITH ROTATOR CUFF REPAIR AND SUBACROMIAL DECOMPRESSION Right 07/01/2013   Procedure: RIGHT SHOULDER ARTHROSCOPY WITH  SUBACROMIAL DECOMPRESSION/DISTAL CLAVICLE RESECTION/ROTATOR CUFF REPAIR ;  Surgeon: Marin Shutter, MD;  Location: [place];  Service: Orthopedics;  Laterality: Right;  . SPINE SURGERY  1985   cervical laminectomy  . TOTAL KNEE ARTHROPLASTY    . TRANSTHORACIC ECHOCARDIOGRAM  09/'17; 4/'18   a) Normal EF 55-60%. Mild to moderate aortic stenosis with moderate regurgitation. Mild MR.;; b) Mild global reduction in LV systolic function: 81-19%; grade 1 DD - high LVEDP. Mild AS/AI/TR. Mild-Mod MR; moderately elevated PAP.  Marland Kitchen VAGOTOMY     partial gastrectomy    Family History  Problem Relation Age of Onset  . Heart disease Sister        Heart Transplant   . Diabetes Sister     Allergies  Allergen Reactions  . Amlodipine Besylate Hives  . Hydralazine Itching  . Lipitor [Atorvastatin] Itching  . Naproxen Diarrhea  . Oxycodone-Acetaminophen Itching  . Isosorbide Rash    Current Outpatient Medications on File Prior to Visit    Medication Sig Dispense Refill  . aspirin EC 81 MG tablet Take 1 tablet (81 mg total) by mouth daily. 90 tablet 3  . brinzolamide (AZOPT) 1 % ophthalmic suspension Place 1 drop into the left eye every 12 (twelve) hours.     . metoprolol succinate (TOPROL-XL) 25 MG 24 hr tablet Take 1 tablet (25 mg total) by mouth daily. 30 tablet 6  . nitroGLYCERIN (NITROSTAT) 0.4 MG SL tablet DISSOLVE 1 TABLET UNDER THE TONGUE EVERY 5 MINUTES AS NEEDED FOR CHEST PAIN 25 tablet 12  . torsemide (DEMADEX) 20 MG tablet Take 40 mg by mouth daily.    . TRAVATAN Z 0.004 % SOLN ophthalmic solution Place 1 drop into both eyes at bedtime.      No current facility-administered medications on file prior to visit.     BP (!) 150/60   Temp 98.2 F (36.8 C)   Wt 98 lb (44.5 kg)   BMI 15.35 kg/m       Objective:   Physical Exam  Constitutional: He is oriented to person, place, and time. Vital signs are normal. He appears well-developed. He appears cachectic. No distress.  Cardiovascular: Normal rate, regular rhythm, normal heart sounds and intact distal pulses.  Pulmonary/Chest: Effort normal and breath sounds normal.  Abdominal: Soft. Bowel sounds are normal.  Musculoskeletal: Normal range of motion. He exhibits no edema, tenderness or deformity.  Neurological: He is alert and oriented to person, place, and time.  Skin: Skin is warm and dry. He is not diaphoretic.  Psychiatric: He has a normal mood and affect. His behavior is normal. Judgment and thought content normal.  Nursing note and vitals reviewed.     Assessment & Plan:  1. Weight loss - Advised foods high in protein and calories. Avoid fast foods and candy.  - Encouraged to take Remeron QHS.  - Follow up in one month  - Follow up with GI  - mirtazapine (REMERON) 7.5 MG tablet; TAKE 1 TABLET(7.5 MG) BY MOUTH AT  BEDTIME  Dispense: 90 tablet; Refill: 0  2. Depression, major, single episode, mild (HCC) - mirtazapine (REMERON) 7.5 MG tablet; TAKE 1  TABLET(7.5 MG) BY MOUTH AT BEDTIME  Dispense: 90 tablet; Refill: 0   Dorothyann Peng, NP

## 2018-09-09 DIAGNOSIS — H353221 Exudative age-related macular degeneration, left eye, with active choroidal neovascularization: Secondary | ICD-10-CM | POA: Diagnosis not present

## 2018-09-09 DIAGNOSIS — H353231 Exudative age-related macular degeneration, bilateral, with active choroidal neovascularization: Secondary | ICD-10-CM | POA: Diagnosis not present

## 2018-09-09 DIAGNOSIS — H353211 Exudative age-related macular degeneration, right eye, with active choroidal neovascularization: Secondary | ICD-10-CM | POA: Diagnosis not present

## 2018-09-11 DIAGNOSIS — N183 Chronic kidney disease, stage 3 (moderate): Secondary | ICD-10-CM | POA: Diagnosis not present

## 2018-09-11 DIAGNOSIS — R634 Abnormal weight loss: Secondary | ICD-10-CM | POA: Diagnosis not present

## 2018-09-17 ENCOUNTER — Ambulatory Visit: Payer: Self-pay | Admitting: *Deleted

## 2018-09-17 NOTE — Telephone Encounter (Signed)
Pt and wife contacted about drinking  evolve protein shakes; they state that their friend who is a nutrutionist recommended it; he also states that he did start the Remeron as ordered by Dorothyann Peng on 09/08/18; they both would like to know if it is safe for him to have this shake; the best contact number is [phone]; will route to office for provider review.    Reason for Disposition . [1] Caller requesting NON-URGENT health information AND [2] PCP's office is the best resource  Answer Assessment - Initial Assessment Questions 1. REASON FOR CALL or QUESTION: "What is your reason for calling today?" or "How can I best help you?" or "What question do you have that I can help answer?"      Pt and wife contacted about drinking  evolve protein shakes;  Protocols used: INFORMATION ONLY CALL-A-AH

## 2018-09-18 NOTE — Telephone Encounter (Signed)
Wife is aware

## 2018-09-18 NOTE — Telephone Encounter (Signed)
He is fine to use those protein shakes but they are pretty expensive. Ensure will probably be cheaper

## 2018-10-08 ENCOUNTER — Ambulatory Visit (INDEPENDENT_AMBULATORY_CARE_PROVIDER_SITE_OTHER): Payer: Medicare Other | Admitting: Adult Health

## 2018-10-08 ENCOUNTER — Encounter: Payer: Self-pay | Admitting: Adult Health

## 2018-10-08 VITALS — BP 142/76 | Temp 98.3°F | Wt 101.0 lb

## 2018-10-08 DIAGNOSIS — I6523 Occlusion and stenosis of bilateral carotid arteries: Secondary | ICD-10-CM | POA: Diagnosis not present

## 2018-10-08 DIAGNOSIS — R634 Abnormal weight loss: Secondary | ICD-10-CM

## 2018-10-08 DIAGNOSIS — Z23 Encounter for immunization: Secondary | ICD-10-CM | POA: Diagnosis not present

## 2018-10-08 DIAGNOSIS — F329 Major depressive disorder, single episode, unspecified: Secondary | ICD-10-CM | POA: Diagnosis not present

## 2018-10-08 NOTE — Progress Notes (Signed)
Subjective:    Patient ID: Nicholas Porter, male    DOB: December 06, 1930, 84 y.o.   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  He presents to the office today for follow up regarding unintentional weight loss. During his last visit he was encouraged to start Remeron and eat foods high in protein and add meal replacement shakes.   Today he reports that he tried Remeron but stopped taking it due to excessive tiredness. His reports that his appetite is good and he is eating more. He denies any fevers, n/v/d   He also feels as though depression is improving without medication   Wt Readings from Last 3 Encounters:  10/08/18 101 lb (45.8 kg)  09/08/18 98 lb (44.5 kg)  04/21/18 107 lb 9.6 oz (48.8 kg)    Review of Systems See HPI   Past Medical History:  Diagnosis Date  . Anemia of chronic renal failure, stage 4 (severe) ([place]) 11/27/2015  . 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 CAD S/P percutaneous coronary angioplasty - DES in RCA with PTCA for ISR; Moderate mLAD, 80% ostial OM2 stable 05/22/2008   Qualifier: Diagnosis of  By: Linda Hedges MD, Heinz Knuckles   . CKD (chronic kidney disease) stage 3, GFR 30-59 ml/min ([place])   . COPD (chronic obstructive pulmonary disease) ([place])   . Essential  hypertension 06/30/2007   Qualifier: Diagnosis of  By: Cori Razor RN, Mikal Plane Eye abnormalities    injections in eyes 12/2017  . H/O Inferior STEMI (09/2013) emergent PCI with Promus DES to RCA (3.0 mm  x 28 mm - 3.2 mm) 10/05/2013  . Left-sided carotid artery disease -- s/p CEA 04/06/2006   S/p Left CEA   . Mitral regurgitation - onexam 09/03/2016    Social History   Socioeconomic History  . Marital status: Married    Spouse name: Not on file  . Number of children: Not on file  . Years of education: Not on file  . Highest education level: Not on file  Occupational History    Employer: RETIRED    Comment: Retired  Scientific laboratory technician  . Financial resource strain: Not on file  . Food insecurity:    Worry: Not on file    Inability: Not on file  . Transportation needs:    Medical: Not on file    Non-medical: Not on file  Tobacco Use  . Smoking status: Former Smoker    Types: Cigarettes    Last attempt to quit: 12/23/1964    Years since quitting: 53.8  . Smokeless tobacco: Never Used  Substance and Sexual Activity  . Alcohol use: No  . Drug use: No  . Sexual activity: Not on file  Lifestyle  . Physical activity:    Days per week: Not on file    Minutes per session: Not  on file  . Stress: Not on file  Relationships  . Social connections:    Talks on phone: Not on file    Gets together: Not on file    Attends religious service: Not on file    Active member of club or organization: Not on file    Attends meetings of clubs or organizations: Not on file    Relationship status: Not on file  . Intimate partner violence:    Fear of current or ex partner: Not on file    Emotionally abused: Not on file    Physically abused: Not on file    Forced sexual activity: Not on file  Other Topics Concern  . Not on file  Social History Narrative   He is married with 2 children. He lives with his wife.   Very active at home, prior to a STEMI, he could walk several miles without discomfort.  Prior to his MI, he would use his push mower to mow his entire lawn.   He is a former smoker and quit back in 1966. He does not drink alcohol    Past Surgical History:  Procedure Laterality Date  . AMPUTATION     left index finger -tramatic  . CARDIAC CATHETERIZATION  November 2011   40% mid LAD, 50% proximal RCA, 30-40% distal RCA (DOMINANT RCA with wraparound PDA & 2 large PLBs), 60-70% ostial OM1. No change from 2001. Medical therapy  . CAROTID ENDARTERECTOMY Left 2007   Dr.Hayes: 08/2016 Dopplers: Stable less than 40% right internal carotid stenosis and stable moderate 40-60% left internal carotid stenosis. Patent vertebral and subclavian arteries.  . CARPAL TUNNEL RELEASE    . CORNEAL TRANSPLANT    . CORONARY ANGIOPLASTY WITH STENT PLACEMENT  09/2013; 05/2014    d) Promus DES 3.0 mm x 28 mm (3.2 mm)  to RCA with STEMI; residual 70% mid-distal LAD, OM 270-80%. Medical management; b) PTCA of 75 % ISR , LAD lesion noted as ~40%  . FOOT SURGERY     left  . HERNIA REPAIR    . LEFT HEART CATHETERIZATION WITH CORONARY ANGIOGRAM N/A 10/05/2013   Procedure: LEFT HEART CATHETERIZATION WITH CORONARY ANGIOGRAM;  Surgeon: Peter M Martinique, MD;  Location: [facility] CATH LAB;  Service: Cardiovascular;  RCA 100% - PCI,CxOM stable, LAD ~70% mid;  . LEFT HEART CATHETERIZATION WITH CORONARY ANGIOGRAM N/A 06/08/2014   Procedure: LEFT HEART CATHETERIZATION WITH CORONARY ANGIOGRAM;  Surgeon: Troy Sine, MD;  Location: [facility] CATH LAB;  Service: Cardiovascular;  UA 6/'15: 75% ISR in RCA - PTCA, LAD lesion ~40%, CxOM stable.    Marland Kitchen NM MYOVIEW LTD  03/2017    - Elevatred Troponin in setting of Sepsis -- DEMAND ISCHEMIA.  Normal perfusion. LVEF 51% with normal wall motion. This is a low risk study.  Marland Kitchen PARTIAL GASTRECTOMY    . PERCUTANEOUS CORONARY INTERVENTION-BALLOON ONLY  06/08/2014   Procedure: PERCUTANEOUS CORONARY INTERVENTION-BALLOON ONLY;  Surgeon: Troy Sine, MD;  Location: [facility] CATH LAB;  Service: Cardiovascular;;   . ROTATOR CUFF REPAIR     left  . SHOULDER ARTHROSCOPY WITH ROTATOR CUFF REPAIR AND SUBACROMIAL DECOMPRESSION Right 07/01/2013   Procedure: RIGHT SHOULDER ARTHROSCOPY WITH  SUBACROMIAL DECOMPRESSION/DISTAL CLAVICLE RESECTION/ROTATOR CUFF REPAIR ;  Surgeon: Marin Shutter, MD;  Location: [place];  Service: Orthopedics;  Laterality: Right;  . SPINE SURGERY  1985   cervical laminectomy  . TOTAL KNEE ARTHROPLASTY    . TRANSTHORACIC ECHOCARDIOGRAM  09/'17; 4/'18   a) Normal EF  55-60%. Mild to moderate aortic stenosis with moderate regurgitation. Mild MR.;; b) Mild global reduction in LV systolic function: 50-93%; grade 1 DD - high LVEDP. Mild AS/AI/TR. Mild-Mod MR; moderately elevated PAP.  Marland Kitchen VAGOTOMY     partial gastrectomy    Family History  Problem Relation Age of Onset  . Heart disease Sister        Heart Transplant   . Diabetes Sister     Allergies  Allergen Reactions  . Amlodipine Besylate Hives  . Hydralazine Itching  . Lipitor [Atorvastatin] Itching  . Naproxen Diarrhea  . Oxycodone-Acetaminophen Itching  . Isosorbide Rash    Current Outpatient Medications on File Prior to Visit  Medication Sig Dispense Refill  . aspirin EC 81 MG tablet Take 1 tablet (81 mg total) by mouth daily. 90 tablet 3  . brinzolamide (AZOPT) 1 % ophthalmic suspension Place 1 drop into the left eye every 12 (twelve) hours.     . metoprolol succinate (TOPROL-XL) 25 MG 24 hr tablet Take 1 tablet (25 mg total) by mouth daily. 30 tablet 6  . mirtazapine (REMERON) 7.5 MG tablet TAKE 1 TABLET(7.5 MG) BY MOUTH AT BEDTIME 90 tablet 0  . nitroGLYCERIN (NITROSTAT) 0.4 MG SL tablet DISSOLVE 1 TABLET UNDER THE TONGUE EVERY 5 MINUTES AS NEEDED FOR CHEST PAIN 25 tablet 12  . torsemide (DEMADEX) 20 MG tablet Take 40 mg by mouth daily.    . TRAVATAN Z 0.004 % SOLN ophthalmic solution Place 1 drop into both eyes at bedtime.      No current facility-administered medications on file prior to visit.     BP (!) 142/76    Temp 98.3 F (36.8 C)   Wt 101 lb (45.8 kg)   BMI 15.82 kg/m       Objective:   Physical Exam  Constitutional: He is oriented to person, place, and time. He appears well-developed. He appears cachectic. No distress.  Cardiovascular: Normal rate, regular rhythm, normal heart sounds and intact distal pulses.  Pulmonary/Chest: Effort normal and breath sounds normal.  Neurological: He is alert and oriented to person, place, and time.  Skin: Skin is warm and dry. He is not diaphoretic.  Psychiatric: He has a normal mood and affect. His behavior is normal. Thought content normal.  Nursing note and vitals reviewed.      Assessment & Plan:  1. Unintentional weight loss - Encouraged to continue with high protein foods and nutritional supplement  - Follow up in three months   2. Reactive depression - seems to be improving  - Follow up as needed  Dorothyann Peng, NP

## 2018-10-09 DIAGNOSIS — H401132 Primary open-angle glaucoma, bilateral, moderate stage: Secondary | ICD-10-CM | POA: Diagnosis not present

## 2018-10-21 ENCOUNTER — Encounter: Payer: Self-pay | Admitting: Cardiology

## 2018-10-21 ENCOUNTER — Ambulatory Visit (INDEPENDENT_AMBULATORY_CARE_PROVIDER_SITE_OTHER): Payer: Medicare Other | Admitting: Cardiology

## 2018-10-21 VITALS — BP 197/76 | HR 56 | Ht 67.0 in | Wt 101.0 lb

## 2018-10-21 DIAGNOSIS — I10 Essential (primary) hypertension: Secondary | ICD-10-CM | POA: Diagnosis not present

## 2018-10-21 DIAGNOSIS — I2510 Atherosclerotic heart disease of native coronary artery without angina pectoris: Secondary | ICD-10-CM

## 2018-10-21 DIAGNOSIS — E785 Hyperlipidemia, unspecified: Secondary | ICD-10-CM | POA: Diagnosis not present

## 2018-10-21 DIAGNOSIS — Z9861 Coronary angioplasty status: Secondary | ICD-10-CM

## 2018-10-21 DIAGNOSIS — I951 Orthostatic hypotension: Secondary | ICD-10-CM | POA: Diagnosis not present

## 2018-10-21 DIAGNOSIS — I6523 Occlusion and stenosis of bilateral carotid arteries: Secondary | ICD-10-CM | POA: Diagnosis not present

## 2018-10-21 DIAGNOSIS — E43 Unspecified severe protein-calorie malnutrition: Secondary | ICD-10-CM | POA: Diagnosis not present

## 2018-10-21 DIAGNOSIS — I6522 Occlusion and stenosis of left carotid artery: Secondary | ICD-10-CM | POA: Diagnosis not present

## 2018-10-21 NOTE — Progress Notes (Signed)
PCP: Dorothyann Peng, NP  Clinic Note: Chief Complaint  Patient presents with  . Follow-up    Has not taken blood pressure medication today and is quite stressed out.  Otherwise no complaints.  . Coronary Artery Disease    No angina    HPI: Nicholas Porter is a 84 y.o. male with a PMH notable for CAD-PCI of the setting STEMI followed by unstable angina as well as carotid disease status post CEA who presents today for 93-month follow-up.  CVD History:  H/O Inferior STEMI (09/2013) emergent PCI with Promus DES to RCA (3.0 mm x 28 mm - 3.2 mm)  Unstable Angina 05/2014: ISR - cutting balloon PTCA ; Ostial OM2 80% (stable) -  Most recent Echo and Myoview April 2018: EF 51%.  Low risk.  Echo with EF 45 to 50%.  GR 1 DD.  Mild AI and TR.  Mild to moderate MR.  L Carotid Dz - s/p CEA 03/2014  Nicholas Porter was seen in November 2018 feeling quite well from a cardiac standpoint.  Was started to gain back weight.  Only noted some skin itching.  No symptoms -no changes.  He was last seen on January 29 by Almyra Deforest, PA after his ER visit.  Indicated that he had a rash every time he restarted Toprol.  Lipids were ordered, but not checked.  The patient apparently self stopped statin.  Recent Hospitalizations:   ER visit January 21 for fall with head laceration.  Studies Personally Reviewed - (if available, images/films reviewed: From Epic Chart or Care Everywhere)  Carotid Dopplers 07/20/2018: Mild bilateral disease.  Interval History: Nicholas Porter presents today doing well from a cardiac standpoint.  He is very stressed out thinking about upcoming eye injections this Friday.  These terrified him any worries about it for a week or so going into it.  He says he is been so stressed out and not sleeping well.  He has not yet taken his blood pressure medications.  He says most the time his blood pressures at home range in the 140/70 mmHg range.  This is a very high reading for him but he is not  somatic. His only question from a cardiovascular standpoint is why did his nephrologist start him on Demadex.  He is not noticing any edema.  No PND orthopnea. He has no chest tightness pressure rest or exertion no headache or blurred vision despite elevated blood pressure.  No syncope or near syncope.  No TIA or amaurosis fugax.  No claudication.  He is still totally active doing all kinds of activities out in the yard gardening, mowing lawns, cutting wood etc.  He acknowledges that is probably not able to do as much as he used to, but still does not let things stop him.  His wife is very concerned that he does too much.  In addition to being stressed out about his eye injections, he is also very stressed out because his wife just had her work hours reduced by her boss.  He is very upset about the situation partially because of her self-esteem, but also because she is been working there for 50 years and they need the income.  They feel betrayed.  ROS: A comprehensive was performed. Review of Systems  Constitutional: Negative for chills, fever, malaise/fatigue and weight loss (Finally stabilized weight).  HENT: Negative for congestion and nosebleeds.   Eyes: Negative for blurred vision and double vision.  Respiratory: Negative for cough, shortness of breath and wheezing.  Cardiovascular: Negative for leg swelling.  Gastrointestinal: Negative for blood in stool, heartburn and melena.  Genitourinary: Negative for hematuria.  Musculoskeletal: Positive for joint pain (Arthritis pains but nothing much).  Skin: Negative for itching (No longer having itching issues.).  Neurological: Negative for dizziness, focal weakness and headaches.       Sometimes he has poor balance, but not currently today  Psychiatric/Behavioral: Negative for depression and memory loss. The patient is nervous/anxious (See HPI) and has insomnia (Just this past week).   All other systems reviewed and are negative.  I have  reviewed and (if needed) personally updated the patient's problem list, medications, allergies, past medical and surgical history, social and family history.   Past Medical History:  Diagnosis Date  . Anemia of chronic renal failure, stage 4 (severe) (No Name) 11/27/2015  . 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 CAD S/P percutaneous coronary angioplasty - DES in RCA with PTCA for ISR; Moderate mLAD, 80% ostial OM2 stable 05/22/2008   Qualifier: Diagnosis of  By: Linda Hedges MD, Heinz Knuckles   . CKD (chronic kidney disease) stage 3, GFR 30-59 ml/min ([place])   . COPD (chronic obstructive pulmonary disease) ([place])   . Essential hypertension 06/30/2007   Qualifier: Diagnosis of  By: Cori Razor RN, Mikal Plane Eye abnormalities    injections in eyes 12/2017  . H/O Inferior STEMI (09/2013) emergent PCI with Promus DES to RCA (3.0 mm  x 28 mm - 3.2 mm) 10/05/2013  . Left-sided carotid artery disease -- s/p CEA 04/06/2006   S/p Left CEA   . Mitral regurgitation - onexam 09/03/2016    Past Surgical History:  Procedure Laterality Date  . AMPUTATION     left index finger -tramatic  . CARDIAC CATHETERIZATION  November 2011   40% mid LAD, 50% proximal RCA, 30-40% distal RCA (DOMINANT RCA with wraparound PDA & 2 large PLBs), 60-70% ostial OM1. No change from 2001. Medical therapy  . CAROTID ENDARTERECTOMY Left 2007   Dr.Hayes: 08/2016 Dopplers: Stable less than 40% right internal carotid stenosis and stable moderate 40-60% left internal carotid stenosis. Patent vertebral and subclavian arteries.  . CARPAL TUNNEL RELEASE    . CORNEAL TRANSPLANT    . CORONARY ANGIOPLASTY WITH STENT PLACEMENT  09/2013; 05/2014    d) Promus DES 3.0 mm x 28 mm (3.2 mm)  to RCA with STEMI; residual 70% mid-distal LAD, OM 270-80%. Medical management; b) PTCA of 75 % ISR , LAD lesion noted as ~40%  . FOOT SURGERY     left  . HERNIA REPAIR    . LEFT HEART  CATHETERIZATION WITH CORONARY ANGIOGRAM N/A 10/05/2013   Procedure: LEFT HEART CATHETERIZATION WITH CORONARY ANGIOGRAM;  Surgeon: Peter M Martinique, MD;  Location: [facility] CATH LAB;  Service: Cardiovascular;  RCA 100% - PCI,CxOM stable, LAD ~70% mid;  . LEFT HEART CATHETERIZATION WITH CORONARY ANGIOGRAM N/A 06/08/2014   Procedure: LEFT HEART CATHETERIZATION WITH CORONARY ANGIOGRAM;  Surgeon: Troy Sine, MD;  Location: [facility] CATH LAB;  Service: Cardiovascular;  UA 6/'15: 75% ISR in RCA - PTCA, LAD lesion ~40%, CxOM stable.    Marland Kitchen NM MYOVIEW LTD  03/2017    - Elevatred Troponin in setting of Sepsis -- DEMAND ISCHEMIA.  Normal perfusion. LVEF 51% with normal wall motion. This is a low risk study.  Marland Kitchen PARTIAL GASTRECTOMY    . PERCUTANEOUS CORONARY INTERVENTION-BALLOON ONLY  06/08/2014  Procedure: PERCUTANEOUS CORONARY INTERVENTION-BALLOON ONLY;  Surgeon: Troy Sine, MD;  Location: [facility] CATH LAB;  Service: Cardiovascular;;  . ROTATOR CUFF REPAIR     left  . SHOULDER ARTHROSCOPY WITH ROTATOR CUFF REPAIR AND SUBACROMIAL DECOMPRESSION Right 07/01/2013   Procedure: RIGHT SHOULDER ARTHROSCOPY WITH  SUBACROMIAL DECOMPRESSION/DISTAL CLAVICLE RESECTION/ROTATOR CUFF REPAIR ;  Surgeon: Marin Shutter, MD;  Location: [facility];  Service: Orthopedics;  Laterality: Right;  . SPINE SURGERY  1985   cervical laminectomy  . TOTAL KNEE ARTHROPLASTY    . TRANSTHORACIC ECHOCARDIOGRAM  09/'17; 4/'18   a) Normal EF 55-60%. Mild to moderate aortic stenosis with moderate regurgitation. Mild MR.;; b) Mild global reduction in LV systolic function: 13-08%; grade 1 DD - high LVEDP. Mild AS/AI/TR. Mild-Mod MR; moderately elevated PAP.  Marland Kitchen VAGOTOMY     partial gastrectomy    Current Meds  Medication Sig  . aspirin EC 81 MG tablet Take 1 tablet (81 mg total) by mouth daily.  . brinzolamide (AZOPT) 1 % ophthalmic suspension Place 1 drop into the left eye every 12 (twelve) hours.   . metoprolol succinate (TOPROL-XL) 25 MG 24 hr tablet Take 1  tablet (25 mg total) by mouth daily.  Marland Kitchen torsemide (DEMADEX) 20 MG tablet Take 40 mg by mouth daily.  . TRAVATAN Z 0.004 % SOLN ophthalmic solution Place 1 drop into both eyes at bedtime.     Allergies  Allergen Reactions  . Amlodipine Besylate Hives  . Hydralazine Itching  . Lipitor [Atorvastatin] Itching  . Naproxen Diarrhea  . Oxycodone-Acetaminophen Itching  . Isosorbide Rash    Social History   Tobacco Use  . Smoking status: Former Smoker    Types: Cigarettes    Last attempt to quit: 12/23/1964    Years since quitting: 53.8  . Smokeless tobacco: Never Used  Substance Use Topics  . Alcohol use: No  . Drug use: No   Social History   Social History Narrative   He is married with 2 children. He lives with his wife.   Very active at home, prior to a STEMI, he could walk several miles without discomfort. Prior to his MI, he would use his push mower to mow his entire lawn.   He is a former smoker and quit back in 1966. He does not drink alcohol    family history includes Diabetes in his sister; Heart disease in his sister.  Wt Readings from Last 3 Encounters:  10/21/18 101 lb (45.8 kg)  10/08/18 101 lb (45.8 kg)  09/08/18 98 lb (44.5 kg)  10/2017 - 110 lb.   PHYSICAL EXAM BP (!) 197/76   Pulse (!) 56   Ht 5\' 7"  (1.702 m)   Wt 101 lb (45.8 kg)   BMI 15.82 kg/m  Physical Exam  Constitutional: He is oriented to person, place, and time. No distress.  Thin, frail elderly gentleman.  However, is quite vibrant.  HENT:  Head: Normocephalic and atraumatic.  As usual, partially shaved  Neck: Normal range of motion. Neck supple. No hepatojugular reflux and no JVD present. Carotid bruit is not present.  Cardiovascular: Normal rate, regular rhythm, normal heart sounds and intact distal pulses.  No extrasystoles are present. PMI is not displaced. Exam reveals no gallop and no friction rub.  No murmur heard. Pulmonary/Chest: Effort normal and breath sounds normal. No  respiratory distress. He has no wheezes. He has no rales.  Abdominal: Soft. Bowel sounds are normal. He exhibits no distension. There is no tenderness.  No HSM  Musculoskeletal: Normal range of motion. He exhibits no edema.  Neurological: He is alert and oriented to person, place, and time.  Psychiatric: He has a normal mood and affect. His behavior is normal. Judgment and thought content normal.  Still very happy and animated  Vitals reviewed.   Adult ECG Report Not checked  Other studies Reviewed: Additional studies/ records that were reviewed today include:  Recent Labs: Labs not available.  ASSESSMENT / PLAN: Problem List Items Addressed This Visit    CAD S/P percutaneous coronary angioplasty - DES in RCA with PTCA for ISR; Moderate mLAD, 80% ostial OM2 stable - Primary (Chronic)    Doing well without any further anginal symptoms.  Myoview back in August was negative with no ischemia.  Also normal echocardiogram. No longer on Plavix because of bleeding issues.  On aspirin alone. On low-dose Toprol tolerating well. Self stopped statin.  Had myalgias.  Not interested in trying another statin or other cholesterol medicine.      Essential hypertension (Chronic)    Extremely hypertensive today.  Better on my recheck.  He is so stressed out, and has not yet taken his medicines.  Most of the time his home readings are much better than this. He is on Demadex and Toprol.  Again looking for less aggressive management and permissive hypertension.  The pressure today is a little bit high and we may need to consider an as needed medicine for periodic hypertension.      Hyperlipidemia with target LDL less than 70    Have not seen labs in quite some time.  He did not get labs checked despite being ordered last visit. He in his but told me that he would not be interested in restarting medicine for his cholesterol.  He is not eating and drinking a ton now has lost a lot of weight.  He does not  want take any more medicines. Will defer to PCP to continue following up, but if labs are checked would be nice to have a baseline evaluation.  In order to respect his wishes, we will not address at this time.      Left-sided carotid artery disease -- s/p CEA (Chronic)   Orthostatic hypotension    Did not tolerate ARB.  Is on minimal low-dose beta-blocker.  Would not be aggressive with titrating medications for his blood pressure.  Would allow for mild permissive hypertension.      Protein-calorie malnutrition, severe (HCC) (Chronic)    Thankfully, his weight is stabilized, but I think he needs to be a little bit more aggressive with supplement his nutrition.  Will defer evaluation to his PCP, but nothing yet found out.  He is not interested in starting any new medicines, and is not overly interested in aggressive evaluation and treatment. Interestingly, he does not indicate a desire to be DNR yet.         I spent a total of 30 minutes with the patient and chart review. >  50% of the time was spent in direct patient consultation.  Although not many issues, he and his wife have several questions and concerns that they want to go through.  A lot of has to do with her concerns about him overdoing it.  -Asked about cardiac clearance for eye surgery.  There is nothing that needs to be done.  No active symptoms.  No additional evaluation needs to be done.  Low risk surgery.  Low risk patient.  Current medicines are  reviewed at length with the patient today.  (+/- concerns) none ; he is not worried about blood pressure is more concerned about upcoming eye injections. The following changes have been made:  See below  Patient Instructions  Medication Instructions:  NO CHANGES If you need a refill on your cardiac medications before your next appointment, please call your pharmacy.   Lab work: NOT NEEDED If you have labs (blood work) drawn today and your tests are completely normal, you will  receive your results only by: Marland Kitchen MyChart Message (if you have MyChart) OR . A paper copy in the mail If you have any lab test that is abnormal or we need to change your treatment, we will call you to review the results.  Testing/Procedures: NOT NEEDED  Follow-Up: At [facility], you and your health needs are our priority.  As part of our continuing mission to provide you with exceptional heart care, we have created designated Provider Care Teams.  These Care Teams include your primary Cardiologist (physician) and Advanced Practice Providers (APPs -  Physician Assistants and Nurse Practitioners) who all work together to provide you with the care you need, when you need it. You will need a follow up appointment in 9 months.  Please call our office 2 months in advance to schedule this appointment.  You may see  or one of the following Advanced Practice Providers on your designated Care Team:   Rosaria Ferries, PA-C . Jory Sims, DNP, ANP  Any Other Special Instructions Will Be Listed Below (If Applicable).   YOU HAVE CARDIAC CLEARANCE FOR UPCOMING EYE SUREGERY    Studies Ordered:   No orders of the defined types were placed in this encounter.     Glenetta Hew, M.D., M.S. Interventional Cardiologist   Pager # [phone] Phone # [phone] [address] [place], [place] [address]   Thank you for choosing [facility] at [facility]!!

## 2018-10-21 NOTE — Patient Instructions (Signed)
Medication Instructions:  NO CHANGES If you need a refill on your cardiac medications before your next appointment, please call your pharmacy.   Lab work: NOT NEEDED If you have labs (blood work) drawn today and your tests are completely normal, you will receive your results only by: Marland Kitchen MyChart Message (if you have MyChart) OR . A paper copy in the mail If you have any lab test that is abnormal or we need to change your treatment, we will call you to review the results.  Testing/Procedures: NOT NEEDED  Follow-Up: At [facility], you and your health needs are our priority.  As part of our continuing mission to provide you with exceptional heart care, we have created designated Provider Care Teams.  These Care Teams include your primary Cardiologist (physician) and Advanced Practice Providers (APPs -  Physician Assistants and Nurse Practitioners) who all work together to provide you with the care you need, when you need it. You will need a follow up appointment in 9 months.  Please call our office 2 months in advance to schedule this appointment.  You may see  or one of the following Advanced Practice Providers on your designated Care Team:   Rosaria Ferries, PA-C . Jory Sims, DNP, ANP  Any Other Special Instructions Will Be Listed Below (If Applicable).   YOU HAVE CARDIAC CLEARANCE FOR UPCOMING EYE SUREGERY

## 2018-10-23 ENCOUNTER — Encounter: Payer: Self-pay | Admitting: Cardiology

## 2018-10-23 DIAGNOSIS — H353231 Exudative age-related macular degeneration, bilateral, with active choroidal neovascularization: Secondary | ICD-10-CM | POA: Diagnosis not present

## 2018-10-23 DIAGNOSIS — E785 Hyperlipidemia, unspecified: Secondary | ICD-10-CM | POA: Insufficient documentation

## 2018-10-23 NOTE — Assessment & Plan Note (Signed)
Extremely hypertensive today.  Better on my recheck.  He is so stressed out, and has not yet taken his medicines.  Most of the time his home readings are much better than this. He is on Demadex and Toprol.  Again looking for less aggressive management and permissive hypertension.  The pressure today is a little bit high and we may need to consider an as needed medicine for periodic hypertension.

## 2018-10-23 NOTE — Assessment & Plan Note (Signed)
Did not tolerate ARB.  Is on minimal low-dose beta-blocker.  Would not be aggressive with titrating medications for his blood pressure.  Would allow for mild permissive hypertension.

## 2018-10-23 NOTE — Assessment & Plan Note (Signed)
Thankfully, his weight is stabilized, but I think he needs to be a little bit more aggressive with supplement his nutrition.  Will defer evaluation to his PCP, but nothing yet found out.  He is not interested in starting any new medicines, and is not overly interested in aggressive evaluation and treatment. Interestingly, he does not indicate a desire to be DNR yet.

## 2018-10-23 NOTE — Assessment & Plan Note (Signed)
Doing well without any further anginal symptoms.  Myoview back in August was negative with no ischemia.  Also normal echocardiogram. No longer on Plavix because of bleeding issues.  On aspirin alone. On low-dose Toprol tolerating well. Self stopped statin.  Had myalgias.  Not interested in trying another statin or other cholesterol medicine.

## 2018-10-23 NOTE — Assessment & Plan Note (Signed)
Have not seen labs in quite some time.  He did not get labs checked despite being ordered last visit. He in his but told me that he would not be interested in restarting medicine for his cholesterol.  He is not eating and drinking a ton now has lost a lot of weight.  He does not want take any more medicines. Will defer to PCP to continue following up, but if labs are checked would be nice to have a baseline evaluation.  In order to respect his wishes, we will not address at this time.

## 2019-01-01 ENCOUNTER — Emergency Department (HOSPITAL_COMMUNITY): Payer: Medicare Other

## 2019-01-01 ENCOUNTER — Ambulatory Visit: Payer: Medicare Other | Admitting: Adult Health

## 2019-01-01 ENCOUNTER — Encounter (HOSPITAL_COMMUNITY): Payer: Self-pay

## 2019-01-01 ENCOUNTER — Other Ambulatory Visit: Payer: Self-pay

## 2019-01-01 ENCOUNTER — Inpatient Hospital Stay (HOSPITAL_COMMUNITY)
Admission: EM | Admit: 2019-01-01 | Discharge: 2019-01-11 | DRG: 417 | Disposition: A | Discharge status: Skilled Nursing Facility | Payer: Medicare Other | Attending: Internal Medicine | Admitting: Internal Medicine

## 2019-01-01 DIAGNOSIS — R634 Abnormal weight loss: Secondary | ICD-10-CM

## 2019-01-01 DIAGNOSIS — I10 Essential (primary) hypertension: Secondary | ICD-10-CM | POA: Diagnosis present

## 2019-01-01 DIAGNOSIS — N183 Chronic kidney disease, stage 3 unspecified: Secondary | ICD-10-CM | POA: Diagnosis present

## 2019-01-01 DIAGNOSIS — Z9689 Presence of other specified functional implants: Secondary | ICD-10-CM | POA: Diagnosis not present

## 2019-01-01 DIAGNOSIS — I252 Old myocardial infarction: Secondary | ICD-10-CM | POA: Diagnosis not present

## 2019-01-01 DIAGNOSIS — R278 Other lack of coordination: Secondary | ICD-10-CM | POA: Diagnosis not present

## 2019-01-01 DIAGNOSIS — M2550 Pain in unspecified joint: Secondary | ICD-10-CM | POA: Diagnosis not present

## 2019-01-01 DIAGNOSIS — K82A1 Gangrene of gallbladder in cholecystitis: Secondary | ICD-10-CM | POA: Diagnosis present

## 2019-01-01 DIAGNOSIS — K8020 Calculus of gallbladder without cholecystitis without obstruction: Secondary | ICD-10-CM

## 2019-01-01 DIAGNOSIS — E871 Hypo-osmolality and hyponatremia: Secondary | ICD-10-CM | POA: Diagnosis present

## 2019-01-01 DIAGNOSIS — I129 Hypertensive chronic kidney disease with stage 1 through stage 4 chronic kidney disease, or unspecified chronic kidney disease: Secondary | ICD-10-CM | POA: Diagnosis not present

## 2019-01-01 DIAGNOSIS — Y838 Other surgical procedures as the cause of abnormal reaction of the patient, or of later complication, without mention of misadventure at the time of the procedure: Secondary | ICD-10-CM | POA: Diagnosis not present

## 2019-01-01 DIAGNOSIS — R1084 Generalized abdominal pain: Secondary | ICD-10-CM | POA: Diagnosis not present

## 2019-01-01 DIAGNOSIS — K810 Acute cholecystitis: Secondary | ICD-10-CM | POA: Diagnosis present

## 2019-01-01 DIAGNOSIS — N400 Enlarged prostate without lower urinary tract symptoms: Secondary | ICD-10-CM | POA: Diagnosis present

## 2019-01-01 DIAGNOSIS — K839 Disease of biliary tract, unspecified: Secondary | ICD-10-CM

## 2019-01-01 DIAGNOSIS — R1310 Dysphagia, unspecified: Secondary | ICD-10-CM

## 2019-01-01 DIAGNOSIS — K219 Gastro-esophageal reflux disease without esophagitis: Secondary | ICD-10-CM | POA: Diagnosis present

## 2019-01-01 DIAGNOSIS — E440 Moderate protein-calorie malnutrition: Secondary | ICD-10-CM | POA: Diagnosis present

## 2019-01-01 DIAGNOSIS — Y92239 Unspecified place in hospital as the place of occurrence of the external cause: Secondary | ICD-10-CM | POA: Diagnosis not present

## 2019-01-01 DIAGNOSIS — R339 Retention of urine, unspecified: Secondary | ICD-10-CM | POA: Diagnosis present

## 2019-01-01 DIAGNOSIS — Z87891 Personal history of nicotine dependence: Secondary | ICD-10-CM | POA: Diagnosis not present

## 2019-01-01 DIAGNOSIS — I130 Hypertensive heart and chronic kidney disease with heart failure and stage 1 through stage 4 chronic kidney disease, or unspecified chronic kidney disease: Secondary | ICD-10-CM | POA: Diagnosis present

## 2019-01-01 DIAGNOSIS — K915 Postcholecystectomy syndrome: Secondary | ICD-10-CM | POA: Diagnosis not present

## 2019-01-01 DIAGNOSIS — E43 Unspecified severe protein-calorie malnutrition: Secondary | ICD-10-CM | POA: Diagnosis present

## 2019-01-01 DIAGNOSIS — Z903 Acquired absence of stomach [part of]: Secondary | ICD-10-CM

## 2019-01-01 DIAGNOSIS — R41841 Cognitive communication deficit: Secondary | ICD-10-CM | POA: Diagnosis not present

## 2019-01-01 DIAGNOSIS — R300 Dysuria: Secondary | ICD-10-CM | POA: Diagnosis present

## 2019-01-01 DIAGNOSIS — J449 Chronic obstructive pulmonary disease, unspecified: Secondary | ICD-10-CM | POA: Diagnosis not present

## 2019-01-01 DIAGNOSIS — Z9861 Coronary angioplasty status: Secondary | ICD-10-CM

## 2019-01-01 DIAGNOSIS — K660 Peritoneal adhesions (postprocedural) (postinfection): Secondary | ICD-10-CM | POA: Diagnosis present

## 2019-01-01 DIAGNOSIS — K9189 Other postprocedural complications and disorders of digestive system: Secondary | ICD-10-CM | POA: Diagnosis not present

## 2019-01-01 DIAGNOSIS — E870 Hyperosmolality and hypernatremia: Secondary | ICD-10-CM | POA: Diagnosis present

## 2019-01-01 DIAGNOSIS — R52 Pain, unspecified: Secondary | ICD-10-CM | POA: Diagnosis not present

## 2019-01-01 DIAGNOSIS — K838 Other specified diseases of biliary tract: Secondary | ICD-10-CM | POA: Diagnosis not present

## 2019-01-01 DIAGNOSIS — R1010 Upper abdominal pain, unspecified: Secondary | ICD-10-CM | POA: Diagnosis not present

## 2019-01-01 DIAGNOSIS — I2510 Atherosclerotic heart disease of native coronary artery without angina pectoris: Secondary | ICD-10-CM | POA: Diagnosis present

## 2019-01-01 DIAGNOSIS — I5042 Chronic combined systolic (congestive) and diastolic (congestive) heart failure: Secondary | ICD-10-CM | POA: Diagnosis present

## 2019-01-01 DIAGNOSIS — K5900 Constipation, unspecified: Secondary | ICD-10-CM | POA: Diagnosis present

## 2019-01-01 DIAGNOSIS — I2119 ST elevation (STEMI) myocardial infarction involving other coronary artery of inferior wall: Secondary | ICD-10-CM | POA: Diagnosis present

## 2019-01-01 DIAGNOSIS — L89626 Pressure-induced deep tissue damage of left heel: Secondary | ICD-10-CM | POA: Diagnosis not present

## 2019-01-01 DIAGNOSIS — Z79899 Other long term (current) drug therapy: Secondary | ICD-10-CM

## 2019-01-01 DIAGNOSIS — I739 Peripheral vascular disease, unspecified: Secondary | ICD-10-CM | POA: Diagnosis present

## 2019-01-01 DIAGNOSIS — Z7401 Bed confinement status: Secondary | ICD-10-CM | POA: Diagnosis not present

## 2019-01-01 DIAGNOSIS — R110 Nausea: Secondary | ICD-10-CM | POA: Diagnosis not present

## 2019-01-01 DIAGNOSIS — Z9114 Patient's other noncompliance with medication regimen: Secondary | ICD-10-CM | POA: Diagnosis not present

## 2019-01-01 DIAGNOSIS — Z96659 Presence of unspecified artificial knee joint: Secondary | ICD-10-CM | POA: Diagnosis present

## 2019-01-01 DIAGNOSIS — E860 Dehydration: Secondary | ICD-10-CM | POA: Diagnosis present

## 2019-01-01 DIAGNOSIS — Z89022 Acquired absence of left finger(s): Secondary | ICD-10-CM

## 2019-01-01 DIAGNOSIS — E785 Hyperlipidemia, unspecified: Secondary | ICD-10-CM | POA: Diagnosis not present

## 2019-01-01 DIAGNOSIS — E875 Hyperkalemia: Secondary | ICD-10-CM | POA: Diagnosis present

## 2019-01-01 DIAGNOSIS — R630 Anorexia: Secondary | ICD-10-CM | POA: Diagnosis present

## 2019-01-01 DIAGNOSIS — Z955 Presence of coronary angioplasty implant and graft: Secondary | ICD-10-CM

## 2019-01-01 DIAGNOSIS — N184 Chronic kidney disease, stage 4 (severe): Secondary | ICD-10-CM | POA: Diagnosis present

## 2019-01-01 DIAGNOSIS — Z681 Body mass index (BMI) 19 or less, adult: Secondary | ICD-10-CM | POA: Diagnosis not present

## 2019-01-01 DIAGNOSIS — Z66 Do not resuscitate: Secondary | ICD-10-CM | POA: Diagnosis present

## 2019-01-01 DIAGNOSIS — Z888 Allergy status to other drugs, medicaments and biological substances status: Secondary | ICD-10-CM

## 2019-01-01 DIAGNOSIS — T85520A Displacement of bile duct prosthesis, initial encounter: Secondary | ICD-10-CM

## 2019-01-01 DIAGNOSIS — Z885 Allergy status to narcotic agent status: Secondary | ICD-10-CM

## 2019-01-01 DIAGNOSIS — K8000 Calculus of gallbladder with acute cholecystitis without obstruction: Principal | ICD-10-CM | POA: Diagnosis present

## 2019-01-01 DIAGNOSIS — N179 Acute kidney failure, unspecified: Secondary | ICD-10-CM | POA: Diagnosis present

## 2019-01-01 DIAGNOSIS — J439 Emphysema, unspecified: Secondary | ICD-10-CM | POA: Diagnosis present

## 2019-01-01 DIAGNOSIS — R2689 Other abnormalities of gait and mobility: Secondary | ICD-10-CM | POA: Diagnosis not present

## 2019-01-01 DIAGNOSIS — R464 Slowness and poor responsiveness: Secondary | ICD-10-CM | POA: Diagnosis not present

## 2019-01-01 DIAGNOSIS — D631 Anemia in chronic kidney disease: Secondary | ICD-10-CM | POA: Diagnosis not present

## 2019-01-01 DIAGNOSIS — I361 Nonrheumatic tricuspid (valve) insufficiency: Secondary | ICD-10-CM | POA: Diagnosis not present

## 2019-01-01 DIAGNOSIS — D696 Thrombocytopenia, unspecified: Secondary | ICD-10-CM | POA: Diagnosis present

## 2019-01-01 DIAGNOSIS — K829 Disease of gallbladder, unspecified: Secondary | ICD-10-CM | POA: Diagnosis not present

## 2019-01-01 DIAGNOSIS — R4189 Other symptoms and signs involving cognitive functions and awareness: Secondary | ICD-10-CM

## 2019-01-01 DIAGNOSIS — F0390 Unspecified dementia without behavioral disturbance: Secondary | ICD-10-CM | POA: Diagnosis present

## 2019-01-01 HISTORY — DX: Sepsis due to anaerobes: A41.4

## 2019-01-01 HISTORY — DX: Acute kidney failure, unspecified: N17.9

## 2019-01-01 HISTORY — DX: Metabolic encephalopathy: G93.41

## 2019-01-01 HISTORY — DX: Chronic kidney disease, stage 4 (severe): N18.4

## 2019-01-01 LAB — FERRITIN: Ferritin: 339 ng/mL — ABNORMAL HIGH (ref 24–336)

## 2019-01-01 LAB — CBC WITH DIFFERENTIAL/PLATELET
ABS IMMATURE GRANULOCYTES: 0.05 10*3/uL (ref 0.00–0.07)
BASOS ABS: 0 10*3/uL (ref 0.0–0.1)
BASOS PCT: 0 %
EOS PCT: 0 %
Eosinophils Absolute: 0 10*3/uL (ref 0.0–0.5)
HCT: 38.5 % — ABNORMAL LOW (ref 39.0–52.0)
HEMOGLOBIN: 13 g/dL (ref 13.0–17.0)
Immature Granulocytes: 0 %
LYMPHS PCT: 4 %
Lymphs Abs: 0.4 10*3/uL — ABNORMAL LOW (ref 0.7–4.0)
MCH: 32.9 pg (ref 26.0–34.0)
MCHC: 33.8 g/dL (ref 30.0–36.0)
MCV: 97.5 fL (ref 80.0–100.0)
Monocytes Absolute: 1 10*3/uL (ref 0.1–1.0)
Monocytes Relative: 9 %
NEUTROS ABS: 9.7 10*3/uL — AB (ref 1.7–7.7)
NRBC: 0 % (ref 0.0–0.2)
Neutrophils Relative %: 87 %
PLATELETS: 92 10*3/uL — AB (ref 150–400)
RBC: 3.95 MIL/uL — AB (ref 4.22–5.81)
RDW: 13.1 % (ref 11.5–15.5)
WBC: 11.2 10*3/uL — AB (ref 4.0–10.5)

## 2019-01-01 LAB — I-STAT TROPONIN, ED: Troponin i, poc: 0.02 ng/mL (ref 0.00–0.08)

## 2019-01-01 LAB — RETICULOCYTES
Immature Retic Fract: 10.9 % (ref 2.3–15.9)
RBC.: 4.32 MIL/uL (ref 4.22–5.81)
Retic Count, Absolute: 55.7 10*3/uL (ref 19.0–186.0)
Retic Ct Pct: 1.3 % (ref 0.4–3.1)

## 2019-01-01 LAB — FOLATE: FOLATE: 10.9 ng/mL (ref >5.9)

## 2019-01-01 LAB — COMPREHENSIVE METABOLIC PANEL
ALK PHOS: 90 U/L (ref 38–126)
ALT: 9 U/L (ref 0–44)
AST: 20 U/L (ref 15–41)
Albumin: 4.3 g/dL (ref 3.5–5.0)
Anion gap: 11 (ref 5–15)
BILIRUBIN TOTAL: 1.4 mg/dL — AB (ref 0.3–1.2)
BUN: 46 mg/dL — AB (ref 8–23)
CALCIUM: 9.4 mg/dL (ref 8.9–10.3)
CO2: 23 mmol/L (ref 22–32)
CREATININE: 1.75 mg/dL — AB (ref 0.61–1.24)
Chloride: 108 mmol/L (ref 98–111)
GFR calc Af Amer: 40 mL/min — ABNORMAL LOW (ref >60)
GFR, EST NON AFRICAN AMERICAN: 34 mL/min — AB (ref >60)
Glucose, Bld: 152 mg/dL — ABNORMAL HIGH (ref 70–99)
Potassium: 4.7 mmol/L (ref 3.5–5.1)
Sodium: 142 mmol/L (ref 135–145)
TOTAL PROTEIN: 7.3 g/dL (ref 6.5–8.1)

## 2019-01-01 LAB — VITAMIN B12: Vitamin B-12: 248 pg/mL (ref 180–914)

## 2019-01-01 LAB — IRON AND TIBC
Iron: 18 ug/dL — ABNORMAL LOW (ref 45–182)
Saturation Ratios: 6 % — ABNORMAL LOW (ref 17.9–39.5)
TIBC: 286 ug/dL (ref 250–450)
UIBC: 268 ug/dL

## 2019-01-01 LAB — CREATININE, SERUM
Creatinine, Ser: 1.96 mg/dL — ABNORMAL HIGH (ref 0.61–1.24)
GFR calc Af Amer: 35 mL/min — ABNORMAL LOW (ref >60)
GFR calc non Af Amer: 30 mL/min — ABNORMAL LOW (ref >60)

## 2019-01-01 LAB — LIPASE, BLOOD: LIPASE: 41 U/L (ref 11–51)

## 2019-01-01 LAB — PREALBUMIN: Prealbumin: 23.1 mg/dL (ref 18–38)

## 2019-01-01 MED ORDER — HYDRALAZINE HCL 25 MG PO TABS
25.0000 mg | ORAL_TABLET | Freq: Three times a day (TID) | ORAL | Status: DC
  Administered 2019-01-01 – 2019-01-09 (×19): 25 mg via ORAL
  Filled 2019-01-01 (×20): qty 1

## 2019-01-01 MED ORDER — MORPHINE SULFATE (PF) 4 MG/ML IV SOLN
4.0000 mg | Freq: Once | INTRAVENOUS | Status: AC
Start: 2019-01-01 — End: 2019-01-01
  Administered 2019-01-01: 4 mg via INTRAVENOUS
  Filled 2019-01-01: qty 1

## 2019-01-01 MED ORDER — MENTHOL 3 MG MT LOZG
1.0000 | LOZENGE | OROMUCOSAL | Status: DC | PRN
  Filled 2019-01-01: qty 9

## 2019-01-01 MED ORDER — CHLORHEXIDINE GLUCONATE CLOTH 2 % EX PADS: 6.0000 | MEDICATED_PAD | Freq: Once | CUTANEOUS | Status: DC

## 2019-01-01 MED ORDER — LIP MEDEX EX OINT
1.0000 "application " | TOPICAL_OINTMENT | Freq: Two times a day (BID) | CUTANEOUS | Status: DC
  Administered 2019-01-01 – 2019-01-11 (×16): 1 via TOPICAL
  Filled 2019-01-01 (×3): qty 7

## 2019-01-01 MED ORDER — SODIUM CHLORIDE 0.9% FLUSH: 3.0000 mL | INTRAVENOUS | Status: DC | PRN

## 2019-01-01 MED ORDER — HYDROCORTISONE 1 % EX CREA
1.0000 "application " | TOPICAL_CREAM | Freq: Three times a day (TID) | CUTANEOUS | Status: DC | PRN
  Filled 2019-01-01: qty 28

## 2019-01-01 MED ORDER — MIRTAZAPINE 15 MG PO TABS
7.5000 mg | ORAL_TABLET | Freq: Every day | ORAL | Status: DC
  Administered 2019-01-01 – 2019-01-09 (×7): 7.5 mg via ORAL
  Filled 2019-01-01 (×9): qty 1

## 2019-01-01 MED ORDER — TRAZODONE HCL 50 MG PO TABS
50.0000 mg | ORAL_TABLET | Freq: Every evening | ORAL | Status: DC | PRN
  Administered 2019-01-05 – 2019-01-08 (×3): 50 mg via ORAL
  Filled 2019-01-01 (×4): qty 1

## 2019-01-01 MED ORDER — ACETAMINOPHEN 325 MG PO TABS: 650.0000 mg | ORAL_TABLET | Freq: Four times a day (QID) | ORAL | Status: DC | PRN

## 2019-01-01 MED ORDER — ALUM & MAG HYDROXIDE-SIMETH 200-200-20 MG/5ML PO SUSP: 30.0000 mL | Freq: Four times a day (QID) | ORAL | Status: DC | PRN

## 2019-01-01 MED ORDER — PRAVASTATIN SODIUM 20 MG PO TABS
20.0000 mg | ORAL_TABLET | Freq: Every day | ORAL | Status: DC
  Administered 2019-01-02 – 2019-01-10 (×8): 20 mg via ORAL
  Filled 2019-01-01 (×8): qty 1

## 2019-01-01 MED ORDER — HYDROCORTISONE 2.5 % RE CREA
1.0000 "application " | TOPICAL_CREAM | Freq: Four times a day (QID) | RECTAL | Status: DC | PRN
  Filled 2019-01-01: qty 28.35

## 2019-01-01 MED ORDER — METOPROLOL SUCCINATE ER 25 MG PO TB24
25.0000 mg | ORAL_TABLET | Freq: Once | ORAL | Status: AC
  Administered 2019-01-01: 25 mg via ORAL
  Filled 2019-01-01: qty 1

## 2019-01-01 MED ORDER — CALCIUM CARBONATE-VITAMIN D 500-200 MG-UNIT PO TABS
1.0000 | ORAL_TABLET | ORAL | Status: DC
  Administered 2019-01-03 – 2019-01-11 (×4): 1 via ORAL
  Filled 2019-01-01 (×5): qty 1

## 2019-01-01 MED ORDER — METOPROLOL TARTRATE 25 MG PO TABS
25.0000 mg | ORAL_TABLET | Freq: Two times a day (BID) | ORAL | Status: DC
  Administered 2019-01-01 – 2019-01-11 (×15): 25 mg via ORAL
  Filled 2019-01-01 (×16): qty 1

## 2019-01-01 MED ORDER — BISACODYL 10 MG RE SUPP: 10.0000 mg | Freq: Two times a day (BID) | RECTAL | Status: DC | PRN

## 2019-01-01 MED ORDER — SODIUM CHLORIDE 0.9 % IV SOLN
8.0000 mg | Freq: Four times a day (QID) | INTRAVENOUS | Status: DC | PRN
  Filled 2019-01-01: qty 4

## 2019-01-01 MED ORDER — METHOCARBAMOL 1000 MG/10ML IJ SOLN
1000.0000 mg | Freq: Four times a day (QID) | INTRAVENOUS | Status: DC | PRN
  Filled 2019-01-01: qty 10

## 2019-01-01 MED ORDER — BUPIVACAINE LIPOSOME 1.3 % IJ SUSP
20.0000 mL | Freq: Once | INTRAMUSCULAR | Status: DC
  Filled 2019-01-01: qty 20

## 2019-01-01 MED ORDER — ASPIRIN EC 81 MG PO TBEC
81.0000 mg | DELAYED_RELEASE_TABLET | Freq: Every day | ORAL | Status: DC
  Administered 2019-01-03 – 2019-01-11 (×6): 81 mg via ORAL
  Filled 2019-01-01 (×8): qty 1

## 2019-01-01 MED ORDER — SODIUM CHLORIDE 0.9 % IV SOLN: 250.0000 mL | INTRAVENOUS | Status: DC | PRN

## 2019-01-01 MED ORDER — PHENOL 1.4 % MT LIQD
1.0000 | OROMUCOSAL | Status: DC | PRN
  Filled 2019-01-01: qty 177

## 2019-01-01 MED ORDER — LABETALOL HCL 5 MG/ML IV SOLN
10.0000 mg | INTRAVENOUS | Status: DC | PRN
  Administered 2019-01-10: 10 mg via INTRAVENOUS
  Filled 2019-01-01 (×3): qty 4

## 2019-01-01 MED ORDER — GABAPENTIN 300 MG PO CAPS
300.0000 mg | ORAL_CAPSULE | ORAL | Status: AC
  Filled 2019-01-01: qty 1

## 2019-01-01 MED ORDER — ENSURE SURGERY PO LIQD
237.0000 mL | Freq: Two times a day (BID) | ORAL | Status: DC
  Administered 2019-01-02 – 2019-01-05 (×4): 237 mL via ORAL
  Filled 2019-01-01 (×12): qty 237

## 2019-01-01 MED ORDER — ALBUTEROL SULFATE (2.5 MG/3ML) 0.083% IN NEBU: 2.5000 mg | INHALATION_SOLUTION | RESPIRATORY_TRACT | Status: DC | PRN

## 2019-01-01 MED ORDER — MAGIC MOUTHWASH
15.0000 mL | Freq: Four times a day (QID) | ORAL | Status: DC | PRN
  Filled 2019-01-01: qty 15

## 2019-01-01 MED ORDER — CEFAZOLIN SODIUM-DEXTROSE 2-4 GM/100ML-% IV SOLN
2.0000 g | INTRAVENOUS | Status: DC
  Filled 2019-01-01 (×2): qty 100

## 2019-01-01 MED ORDER — BRINZOLAMIDE 1 % OP SUSP
1.0000 [drp] | Freq: Two times a day (BID) | OPHTHALMIC | Status: DC
  Administered 2019-01-02 – 2019-01-11 (×14): 1 [drp] via OPHTHALMIC
  Filled 2019-01-01 (×2): qty 10

## 2019-01-01 MED ORDER — ACETAMINOPHEN 500 MG PO TABS: 1000.0000 mg | ORAL_TABLET | ORAL | Status: AC

## 2019-01-01 MED ORDER — METRONIDAZOLE IN NACL 5-0.79 MG/ML-% IV SOLN
500.0000 mg | INTRAVENOUS | Status: AC
  Administered 2019-01-02: 500 mg via INTRAVENOUS
  Filled 2019-01-01: qty 100

## 2019-01-01 MED ORDER — HEPARIN SODIUM (PORCINE) 5000 UNIT/ML IJ SOLN
5000.0000 [IU] | Freq: Three times a day (TID) | INTRAMUSCULAR | Status: DC
  Administered 2019-01-02 – 2019-01-04 (×5): 5000 [IU] via SUBCUTANEOUS
  Filled 2019-01-01 (×5): qty 1

## 2019-01-01 MED ORDER — ISOSORBIDE MONONITRATE ER 30 MG PO TB24
30.0000 mg | ORAL_TABLET | Freq: Every day | ORAL | Status: DC
  Administered 2019-01-01 – 2019-01-10 (×6): 30 mg via ORAL
  Filled 2019-01-01 (×6): qty 1

## 2019-01-01 MED ORDER — DEXTROSE-NACL 5-0.45 % IV SOLN
INTRAVENOUS | Status: DC
  Administered 2019-01-01 – 2019-01-03 (×3): via INTRAVENOUS

## 2019-01-01 MED ORDER — TORSEMIDE 20 MG PO TABS
40.0000 mg | ORAL_TABLET | Freq: Once | ORAL | Status: AC
  Administered 2019-01-01: 40 mg via ORAL
  Filled 2019-01-01: qty 2

## 2019-01-01 MED ORDER — ACETAMINOPHEN 325 MG PO TABS: 325.0000 mg | ORAL_TABLET | Freq: Four times a day (QID) | ORAL | Status: DC | PRN

## 2019-01-01 MED ORDER — CLONIDINE HCL 0.1 MG PO TABS
0.1000 mg | ORAL_TABLET | Freq: Once | ORAL | Status: AC
  Administered 2019-01-01: 0.1 mg via ORAL
  Filled 2019-01-01: qty 1

## 2019-01-01 MED ORDER — GUAIFENESIN-DM 100-10 MG/5ML PO SYRP: 10.0000 mL | ORAL_SOLUTION | ORAL | Status: DC | PRN

## 2019-01-01 MED ORDER — ONDANSETRON HCL 4 MG/2ML IJ SOLN
4.0000 mg | Freq: Once | INTRAMUSCULAR | Status: AC
  Administered 2019-01-01: 4 mg via INTRAVENOUS
  Filled 2019-01-01: qty 2

## 2019-01-01 MED ORDER — POLYETHYLENE GLYCOL 3350 17 G PO PACK: 17.0000 g | PACK | Freq: Every day | ORAL | Status: DC | PRN

## 2019-01-01 MED ORDER — FENTANYL CITRATE (PF) 100 MCG/2ML IJ SOLN
25.0000 ug | INTRAMUSCULAR | Status: DC | PRN
  Administered 2019-01-02 (×4): 50 ug via INTRAVENOUS
  Administered 2019-01-02 – 2019-01-06 (×3): 25 ug via INTRAVENOUS
  Administered 2019-01-08 – 2019-01-10 (×4): 50 ug via INTRAVENOUS
  Filled 2019-01-01 (×7): qty 2

## 2019-01-01 MED ORDER — LATANOPROST 0.005 % OP SOLN
1.0000 [drp] | Freq: Every day | OPHTHALMIC | Status: DC
  Administered 2019-01-02 – 2019-01-10 (×8): 1 [drp] via OPHTHALMIC
  Filled 2019-01-01: qty 2.5

## 2019-01-01 MED ORDER — SODIUM CHLORIDE 0.9% FLUSH: 3.0000 mL | Freq: Two times a day (BID) | INTRAVENOUS | Status: DC

## 2019-01-01 MED ORDER — ACETAMINOPHEN 650 MG RE SUPP
650.0000 mg | Freq: Four times a day (QID) | RECTAL | Status: DC | PRN
  Administered 2019-01-02: 650 mg via RECTAL
  Filled 2019-01-01: qty 1

## 2019-01-01 MED ORDER — CALCIUM 600-200 MG-UNIT PO TABS: ORAL_TABLET | ORAL | Status: DC

## 2019-01-01 MED ORDER — SODIUM CHLORIDE 0.9 % IV SOLN
2.0000 g | Freq: Once | INTRAVENOUS | Status: DC
  Filled 2019-01-01: qty 20

## 2019-01-01 MED ORDER — CHLORHEXIDINE GLUCONATE CLOTH 2 % EX PADS
6.0000 | MEDICATED_PAD | Freq: Once | CUTANEOUS | Status: AC
  Administered 2019-01-02: 6 via TOPICAL

## 2019-01-01 MED ORDER — SODIUM CHLORIDE 0.9 % IV BOLUS
500.0000 mL | Freq: Once | INTRAVENOUS | Status: AC
  Administered 2019-01-01: 500 mL via INTRAVENOUS

## 2019-01-01 MED ORDER — PROCHLORPERAZINE EDISYLATE 10 MG/2ML IJ SOLN: 5.0000 mg | INTRAMUSCULAR | Status: DC | PRN

## 2019-01-01 MED ORDER — ONDANSETRON HCL 4 MG/2ML IJ SOLN
4.0000 mg | Freq: Four times a day (QID) | INTRAMUSCULAR | Status: DC | PRN
  Administered 2019-01-02 – 2019-01-06 (×2): 4 mg via INTRAVENOUS

## 2019-01-01 NOTE — ED Provider Notes (Signed)
[facility] DEPT Provider Note   CSN: [id number] Arrival date & time: 01/01/19  1010     History   Chief Complaint Chief Complaint  Patient presents with  . Abdominal Pain  . Nausea    HPI Nicholas Porter is a 85 y.o. male.  The history is provided by the patient.  Abdominal Pain  Pain location:  Epigastric Pain quality: cramping, gnawing and sharp   Pain radiates to:  Does not radiate Pain severity:  Severe Onset quality:  Sudden Duration:  12 hours Timing:  Constant Progression:  Unchanged Chronicity:  New Context: retching   Context: not alcohol use, not recent illness, not sick contacts, not suspicious food intake and not trauma   Context comment:  Started right before he went to bed last night Relieved by:  None tried Worsened by:  Palpation and vomiting Ineffective treatments:  None tried Associated symptoms: anorexia, nausea and vomiting   Associated symptoms: no chest pain, no constipation, no cough, no diarrhea, no dysuria, no fever and no shortness of breath   Risk factors: being elderly   Risk factors comment:  Hx of prior gastrectomy, CAD s/p stents, CKD   Past Medical History:  Diagnosis Date  . Anemia of chronic renal failure, stage 4 (severe) ([place]) 11/27/2015  . 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 CAD S/P percutaneous coronary angioplasty - DES in RCA with PTCA for ISR; Moderate mLAD, 80% ostial OM2 stable 05/22/2008   Qualifier: Diagnosis of  By: Linda Hedges MD, Heinz Knuckles   . CKD (chronic kidney disease) stage 3, GFR 30-59 ml/min ([place])   . COPD (chronic obstructive pulmonary disease) ([place])   . Essential hypertension 06/30/2007   Qualifier: Diagnosis of  By: Cori Razor RN, Mikal Plane Eye abnormalities    injections in eyes 12/2017  . H/O Inferior STEMI (09/2013) emergent PCI with Promus DES to RCA (3.0 mm  x 28 mm - 3.2 mm) 10/05/2013  .  Left-sided carotid artery disease -- s/p CEA 04/06/2006   S/p Left CEA   . Mitral regurgitation - onexam 09/03/2016    Patient Active Problem List   Diagnosis Date Noted  . Hyperlipidemia with target LDL less than 70 10/23/2018  . Psychiatric disturbance 04/15/2017  . Orthostatic hypotension 04/08/2017  . Klebsiella sepsis ([place]) 04/02/2017  . Acute renal failure superimposed on stage 3 chronic kidney disease ([place]) 04/02/2017  . Acute metabolic encephalopathy 25/95/6387  . Benign prostatic hyperplasia 03/11/2017  . Weakness   . Mitral regurgitation - on exam 09/03/2016  . Malnutrition of moderate degree 11/29/2015  . Acute renal failure superimposed on stage 4 chronic kidney disease ([place]) 11/27/2015  . Anemia of chronic renal failure, stage 4 (severe) ([place]) 11/27/2015  . Protein-calorie malnutrition, severe ([place]) 11/27/2015  . GERD (gastroesophageal reflux disease) 01/24/2015  . Thrombocytopenia ([place]) 08/09/2014  . H/O Inferior STEMI (09/2013) emergent PCI with Promus DES to RCA (3.0 mm  x 28 mm - 3.2 mm) 10/05/2013  . 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 CAD S/P percutaneous coronary angioplasty - DES in RCA with PTCA for ISR; Moderate mLAD, 80% ostial OM2 stable 05/22/2008  . Essential hypertension 06/30/2007  . Left-sided carotid artery disease -- s/p CEA 04/06/2006    Class: History of    Past Surgical History:  Procedure Laterality Date  . AMPUTATION     left index finger -tramatic  . CARDIAC CATHETERIZATION  November 2011   40% mid LAD, 50% proximal RCA, 30-40% distal RCA (DOMINANT RCA with wraparound PDA & 2 large PLBs), 60-70% ostial OM1. No change from 2001. Medical therapy  . CAROTID ENDARTERECTOMY Left 2007   Dr.Hayes: 08/2016 Dopplers: Stable less than 40% right internal carotid stenosis and stable moderate 40-60% left internal carotid stenosis. Patent vertebral and subclavian arteries.  . CARPAL TUNNEL RELEASE    . CORNEAL TRANSPLANT    . CORONARY ANGIOPLASTY WITH STENT  PLACEMENT  09/2013; 05/2014    d) Promus DES 3.0 mm x 28 mm (3.2 mm)  to RCA with STEMI; residual 70% mid-distal LAD, OM 270-80%. Medical management; b) PTCA of 75 % ISR , LAD lesion noted as ~40%  . FOOT SURGERY     left  . HERNIA REPAIR    . LEFT HEART CATHETERIZATION WITH CORONARY ANGIOGRAM N/A 10/05/2013   Procedure: LEFT HEART CATHETERIZATION WITH CORONARY ANGIOGRAM;  Surgeon: Peter M Martinique, MD;  Location: [facility] CATH LAB;  Service: Cardiovascular;  RCA 100% - PCI,CxOM stable, LAD ~70% mid;  . LEFT HEART CATHETERIZATION WITH CORONARY ANGIOGRAM N/A 06/08/2014   Procedure: LEFT HEART CATHETERIZATION WITH CORONARY ANGIOGRAM;  Surgeon: Troy Sine, MD;  Location: [facility] CATH LAB;  Service: Cardiovascular;  UA 6/'15: 75% ISR in RCA - PTCA, LAD lesion ~40%, CxOM stable.    Marland Kitchen NM MYOVIEW LTD  03/2017    - Elevatred Troponin in setting of Sepsis -- DEMAND ISCHEMIA.  Normal perfusion. LVEF 51% with normal wall motion. This is a low risk study.  Marland Kitchen PARTIAL GASTRECTOMY    . PERCUTANEOUS CORONARY INTERVENTION-BALLOON ONLY  06/08/2014   Procedure: PERCUTANEOUS CORONARY INTERVENTION-BALLOON ONLY;  Surgeon: Troy Sine, MD;  Location: [facility] CATH LAB;  Service: Cardiovascular;;  . ROTATOR CUFF REPAIR     left  . SHOULDER ARTHROSCOPY WITH ROTATOR CUFF REPAIR AND SUBACROMIAL DECOMPRESSION Right 07/01/2013   Procedure: RIGHT SHOULDER ARTHROSCOPY WITH  SUBACROMIAL DECOMPRESSION/DISTAL CLAVICLE RESECTION/ROTATOR CUFF REPAIR ;  Surgeon: Marin Shutter, MD;  Location: [facility];  Service: Orthopedics;  Laterality: Right;  . SPINE SURGERY  1985   cervical laminectomy  . TOTAL KNEE ARTHROPLASTY    . TRANSTHORACIC ECHOCARDIOGRAM  09/'17; 4/'18   a) Normal EF 55-60%. Mild to moderate aortic stenosis with moderate regurgitation. Mild MR.;; b) Mild global reduction in LV systolic function: 63-84%; grade 1 DD - high LVEDP. Mild AS/AI/TR. Mild-Mod MR; moderately elevated PAP.  Marland Kitchen VAGOTOMY     partial gastrectomy        Home  Medications    Prior to Admission medications   Medication Sig Start Date End Date Taking? Authorizing Provider  aspirin EC 81 MG tablet Take 1 tablet (81 mg total) by mouth daily. 04/23/17   Leonie Man, MD  brinzolamide (AZOPT) 1 % ophthalmic suspension Place 1 drop into the left eye every 12 (twelve) hours.     [provider]  metoprolol succinate (TOPROL-XL) 25 MG 24 hr tablet Take 1 tablet (25 mg total) by mouth daily. 04/22/18   Leonie Man, MD  nitroGLYCERIN (NITROSTAT) 0.4 MG SL tablet DISSOLVE 1 TABLET UNDER THE TONGUE EVERY 5 MINUTES AS NEEDED FOR CHEST PAIN Patient not taking: Reported on 10/21/2018 08/21/17   Leonie Man, MD  torsemide (DEMADEX) 20 MG tablet Take 40 mg by mouth daily.    Penninger, [place], [place]  TRAVATAN Z 0.004 % SOLN ophthalmic solution Place 1 drop into both eyes at bedtime.  04/20/13   [provider]    Family History Family History  Problem Relation Age of Onset  . Heart disease Sister        Heart Transplant   . Diabetes Sister     Social History Social History   Tobacco Use  . Smoking status: Former Smoker    Types: Cigarettes    Last attempt to quit: 12/23/1964    Years since quitting: 54.0  . Smokeless tobacco: Never Used  Substance Use Topics  . Alcohol use: No  . Drug use: No     Allergies   Amlodipine besylate; Hydralazine; Lipitor [atorvastatin]; Naproxen; Oxycodone-acetaminophen; and Isosorbide   Review of Systems Review of Systems  Constitutional: Negative for fever.  Respiratory: Negative for cough and shortness of breath.   Cardiovascular: Negative for chest pain.  Gastrointestinal: Positive for abdominal pain, anorexia, nausea and vomiting. Negative for constipation and diarrhea.  Genitourinary: Negative for dysuria.  All other systems reviewed and are negative.    Physical Exam Updated Vital Signs BP (!) 223/80   Pulse 77   Resp (!) 26   Ht 5\' 7"  (1.702 m)   Wt 45.4 kg   SpO2 97%   BMI  15.66 kg/m   Physical Exam Vitals signs and nursing note reviewed.  Constitutional:      General: He is not in acute distress.    Appearance: He is not ill-appearing.     Comments: Cachectic   HENT:     Head: Normocephalic and atraumatic.  Eyes:     Conjunctiva/sclera: Conjunctivae normal.     Pupils: Pupils are equal, round, and reactive to light.  Neck:     Musculoskeletal: Normal range of motion and neck supple.  Cardiovascular:     Rate and Rhythm: Normal rate and regular rhythm.     Heart sounds: Murmur present. Systolic murmur present with a grade of 2/6.  Pulmonary:     Effort: Pulmonary effort is normal. No respiratory distress.     Breath sounds: Normal breath sounds. No wheezing or rales.  Abdominal:     General: There is no distension.     Palpations: Abdomen is soft.     Tenderness: There is abdominal tenderness in the right upper quadrant, epigastric area and periumbilical area. There is no guarding or rebound.  Musculoskeletal: Normal range of motion.        General: No tenderness.  Skin:    General: Skin is warm and dry.     Findings: No erythema or rash.  Neurological:     General: No focal deficit present.     Mental Status: He is alert and oriented to person, place, and time. Mental status is at baseline.  Psychiatric:        Behavior: Behavior normal.      ED Treatments / Results  Labs (all labs ordered are listed, but only abnormal results are displayed) Labs Reviewed  CBC WITH DIFFERENTIAL/PLATELET - Abnormal; Notable for the following components:      Result Value   WBC 11.2 (*)    RBC 3.95 (*)    HCT 38.5 (*)    Platelets 92 (*)    Neutro Abs 9.7 (*)    Lymphs Abs 0.4 (*)    All other components within normal limits  COMPREHENSIVE METABOLIC PANEL - Abnormal; Notable for the following components:   Glucose, Bld 152 (*)    BUN 46 (*)    Creatinine, Ser 1.75 (*)    Total Bilirubin 1.4 (*)  GFR calc non Af Amer 34 (*)    GFR calc Af  Amer 40 (*)    All other components within normal limits  LIPASE, BLOOD  I-STAT TROPONIN, ED    EKG EKG Interpretation  Date/Time:  Friday January 01 2019 12:20:24 EST Ventricular Rate:  78 PR Interval:    QRS Duration: 90 QT Interval:  465 QTC Calculation: 530 R Axis:   46 Text Interpretation:  Sinus rhythm Atrial premature complex Anterior infarct, old Prolonged QT interval No significant change since last tracing Confirmed by Blanchie Dessert [phone]) on 01/01/2019 12:26:59 PM   Radiology Ct Abdomen Pelvis Wo Contrast  Result Date: 01/01/2019 CLINICAL DATA:  85 year old male with history of left lower quadrant abdominal pain radiating to the back. EXAM: CT ABDOMEN AND PELVIS WITHOUT CONTRAST TECHNIQUE: Multidetector CT imaging of the abdomen and pelvis was performed following the standard protocol without IV contrast. COMPARISON:  CT the abdomen and pelvis 05/07/2018. FINDINGS: Lower chest: Aortic atherosclerosis. Calcified atherosclerotic plaque in the right coronary artery. Calcifications of the mitral annulus. Surgical clips adjacent to the gastroesophageal junction. Hepatobiliary: No definite cystic or solid hepatic lesions are confidently identified on today's noncontrast CT examination. Small calcified gallstones in the gallbladder. No findings to suggest an acute cholecystitis at this time. Pancreas: No definite pancreatic mass or peripancreatic fluid or inflammatory changes are noted on today's noncontrast CT examination. Spleen: Unremarkable. Adrenals/Urinary Tract: There are no abnormal calcifications within the collecting system of either kidney, along the course of either ureter, or within the lumen of the urinary bladder. No hydroureteronephrosis or perinephric stranding to suggest urinary tract obstruction at this time. The unenhanced appearance of the kidneys is unremarkable bilaterally. Unenhanced appearance of the urinary bladder is normal. Bilateral adrenal glands are normal  in appearance. Stomach/Bowel: Unenhanced appearance of the stomach is normal. No pathologic dilatation of small bowel or colon. Numerous colonic diverticulae are noted, without surrounding inflammatory changes to suggest an acute diverticulitis at this time. The appendix is not confidently identified and may be surgically absent. Regardless, there are no inflammatory changes noted adjacent to the cecum to suggest the presence of an acute appendicitis at this time. Vascular/Lymphatic: Aortic atherosclerosis. No lymphadenopathy confidently identified in the abdomen or pelvis on today's noncontrast CT examination. Reproductive: Prostate gland and seminal vesicles are unremarkable in appearance. Other: No significant volume of ascites.  No pneumoperitoneum. Musculoskeletal: There are no aggressive appearing lytic or blastic lesions noted in the visualized portions of the skeleton. IMPRESSION: 1. No acute findings are noted in the abdomen or pelvis to account for the patient's symptoms. 2. Severe colonic diverticulosis without definite surrounding inflammatory changes to indicate an acute diverticulitis at this time. 3. Cholelithiasis without evidence of acute cholecystitis. 4. Aortic atherosclerosis, in addition to least right coronary artery disease. 5. There are calcifications of the mitral annulus. Echocardiographic correlation for evaluation of potential valvular dysfunction may be warranted if clinically indicated. Electronically Signed   By: Vinnie Langton M.D.   On: 01/01/2019 14:36    Procedures Procedures (including critical care time)  Medications Ordered in ED Medications  sodium chloride 0.9 % bolus 500 mL (has no administration in time range)  ondansetron (ZOFRAN) injection 4 mg (has no administration in time range)  morphine 4 MG/ML injection 4 mg (has no administration in time range)     Initial Impression / Assessment and Plan / ED Course  I have reviewed the triage vital signs and the  nursing notes.  Pertinent labs & imaging  results that were available during my care of the patient were reviewed by me and considered in my medical decision making (see chart for details).     Elderly gentleman presenting today with 12 hours of epigastric and upper abdominal pain causing retching.  He last ate around 5 PM last night but was fine until 9 PM when he went to go to bed.  The pain has not improved.  He states he has never had a pain like this except when he was in his 65s and had an ulcer in his stomach that required partial gastrectomy.  Last bowel movement was 2 days ago and normal which is not unusual for him.  Patient's vital signs are reassuring.  He does have pain in his epigastric and right upper quadrant area.  He denies any respiratory or chest symptoms at this time but does have a significant history of coronary artery disease.  Concern for Coley cystitis versus peptic ulcer disease versus obstruction versus atypical ACS.  CBC, CMP, lipase, troponin, EKG pending.  Patient given IV fluids, pain and nausea medication.  2:35 PM EKG without acute findings, creatinine at 1.75 which is at baseline, white count of 11,000 and hemoglobin stable at 13.  Troponin within normal limits, LFTs and lipase also within normal limits.  CT abdomen pelvis pending.  Patient had significant pain relief after morphine.    2:40 PM CT showed cholelithiasis without evidence of acute cholecystitis however given minimal white count these findings on CT we will do an ultrasound to ensure no other acute pathology as patient's CT scan was noncontrasted.  Final Clinical Impressions(s) / ED Diagnoses   Final diagnoses:  Upper abdominal pain    ED Discharge Orders    None       Blanchie Dessert, MD 01/01/19 1553

## 2019-01-01 NOTE — ED Triage Notes (Signed)
Per [facility]- Pt resides at home. Stomach pain left lower quad radiates to back that started yesterday. Denies urinary problems. Weight loss 150 pounds to 90 pounds in 1 year. PCP aware. No OTC meds. Nausea without D or fever.

## 2019-01-01 NOTE — Consult Note (Addendum)
Nicholas Porter  17-Jul-1930 [id number]  CARE TEAM:  PCP: Dorothyann Peng, NP  Outpatient Care Team: Patient Care Team: Dorothyann Peng, NP as PCP - General (Family Medicine) Leonie Man, MD as PCP - Cardiology (Cardiology) Edrick Oh, MD as Consulting Physician (Nephrology) Clarene Essex, MD as Consulting Physician (Gastroenterology)  Inpatient Treatment Team: Treatment Team: Attending Provider: Tegeler, Gwenyth Allegra, MD; Registered Nurse: Braulio Conte, RN; Consulting Physician: Edison Pace, Md, MD   This patient is a 85 y.o.male who presents today for surgical evaluation at the request of Dr Tegeler.   Chief complaint / Reason for evaluation: Cholecystitis  Elderly male with h/o partial gastrectomy/vagatomy in distant past for ulcer.  LAst EGD 2009 with mild gastritis , CAD with stent, CKD, unintentional weight loss 60 lbs last year, etc with c/o epigastric pain last night with retching/N/V.  Persistent.  Felt worse.  Family concerned.  Ambulance called.  Again emergency room 10 AM this morning.  Work-up negative for cardiopulmonary issues.  CT scan and ultrasound suspicious for cholecystitis.  Surgical consultation requested.  Patient is a challenging historian.  Story is different between him and his wife.  He claims he eats very well but is still losing weight.  Wife says he eats okay but not great.  He claims he can walk an hour without difficulty but his wife does not think he can walk more than a block.  Patient notes that his pain is worse in the right upper side.  He does not feel like eating.  He tried some soup this morning but that is about it.  He denies really difficulty with certain foods eating.  Not able to tolerate many supplemental shakes due to taste.  Usually has a few eggs a day  No personal nor family history of GI/colon cancer, inflammatory bowel disease, irritable bowel syndrome, allergy such as Celiac Sprue, dietary/dairy problems, colitis, ulcers nor  gastritis.  No recent sick contacts/gastroenteritis.  No travel outside the country.  No changes in diet.  Mild pill dysphasia and it gets better with some liquids.  No significant heartburn or reflux.  No hematochezia, hematemesis, coffee ground emesis.  No stomach issues that he can recall since his surgery in his 55s.  He does not take nonsteroidals.  Just aspirin.  Struggles with chronic neck and low back pain but usually extra strength Tylenol twice a day keeps him active  He claims he is lost tons of weight.  He has lost 40 pounds in 6 years but it has been a few spurts.  No major changes in the last 3 months:  140lb in 2014 136 in 2015 133 in 2016 126 in December 2016 126 in 08/2016 111 in 05/2017 109 in 12/2017 101 in 09/2018   99 In 01/01/2019  Assessment  [name]  85 y.o. male       Problem List:  Principal Problem:   Acute calculous cholecystitis Active Problems:   Essential hypertension   CAD S/P percutaneous coronary angioplasty - DES in RCA with PTCA for ISR; Moderate mLAD, 80% ostial OM2 stable   H/O Inferior STEMI (09/2013) emergent PCI with Promus DES to RCA (3.0 mm  x 28 mm - 3.2 mm)   GERD (gastroesophageal reflux disease)   Malnutrition of moderate degree   CKD (chronic kidney disease) stage 3, GFR 30-59 ml/min (HCC)   Unintentional weight loss   History of medication noncompliance   History of partial gastrectomy   Acute cholecystitis by H&P &  U/S  Plan:  Medical admission  IV Antibiotics  Clearance by medicine/cardiology.  Seen by Dr Ellyn Hack in past.    HTN control  Eval nutrition status.    VTE prophylaxis- SCDs, etc  Consider gastroenterology evaluation with upper and lower endoscopy to rule out any ulcer or other issues to explain his weight loss.  I suspect he is having inadequate oral intake.  Could consider speech therapy evaluation to rule out pill dysphasia.  Also do calorie counts.  Mobilize as tolerated to help recovery  I  suspect he will need cholecystectomy if medically cleared.  We will tentatively plan for tomorrow.  May have a 730AM time If felt to be a high risk, percutaneous cholecystostomy tube.  The fact that he seems rather active argues against that.  He wanted to talk about his entire life history and his other health issues.  I tried to refocus him on what his current issues are right now.  He is out of the very worried about unintentional weight loss.  At the same time a, he is tired of seeing doctors and complains that nobody knows what is going wrong with him.  I tried to explain all the work-up and and how we have ruled many things out.  That did not seem to satisfy him.  The anatomy & physiology of hepatobiliary & pancreatic function was discussed.  The pathophysiology of gallbladder dysfunction was discussed.  Natural history risks without surgery was discussed.   I feel the risks of no intervention will lead to serious problems that outweigh the operative risks; therefore, I recommended cholecystectomy to remove the pathology.  I explained laparoscopic techniques with possible need for an open approach.  Probable cholangiogram to evaluate the bilary tract was explained as well.    Risks such as bleeding, infection, abscess, leak, injury to other organs, need for repair of tissues / organs, need for further treatment, stroke, heart attack, death, and other risks were discussed.  I noted a good likelihood this will help address the problem.  Possibility that this will not correct all abdominal symptoms was explained.  Goals of post-operative recovery were discussed as well.  We will work to minimize complications.  An educational handout further explaining the pathology and treatment options was given as well.  Questions were answered.  The patient expresses understanding & wishes to proceed with surgery.  We will see if can be medically cleared.    30 minutes spent in review, evaluation, examination,  counseling, and coordination of care.    Adin Hector, MD, FACS, MASCRS Gastrointestinal and Minimally Invasive Surgery    [address], [place], [place] [address] [phone] Main / Paging [phone] Fax   01/01/2019      Past Medical History:  Diagnosis Date  . Anemia of chronic renal failure, stage 4 (severe) ([place]) 11/27/2015  . 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 CAD S/P percutaneous coronary angioplasty - DES in RCA with PTCA for ISR; Moderate mLAD, 80% ostial OM2 stable 05/22/2008   Qualifier: Diagnosis of  By: Linda Hedges MD, Heinz Knuckles   . CKD (chronic kidney disease) stage 3, GFR 30-59 ml/min ([place])   . COPD (chronic obstructive pulmonary disease) ([place])   . Essential hypertension 06/30/2007   Qualifier: Diagnosis of  By: Cori Razor RN, Mikal Plane Eye abnormalities    injections in eyes 12/2017  .  H/O Inferior STEMI (09/2013) emergent PCI with Promus DES to RCA (3.0 mm  x 28 mm - 3.2 mm) 10/05/2013  . Left-sided carotid artery disease -- s/p CEA 04/06/2006   S/p Left CEA   . Mitral regurgitation - onexam 09/03/2016    Past Surgical History:  Procedure Laterality Date  . AMPUTATION     left index finger -tramatic  . CARDIAC CATHETERIZATION  November 2011   40% mid LAD, 50% proximal RCA, 30-40% distal RCA (DOMINANT RCA with wraparound PDA & 2 large PLBs), 60-70% ostial OM1. No change from 2001. Medical therapy  . CAROTID ENDARTERECTOMY Left 2007   Dr.Hayes: 08/2016 Dopplers: Stable less than 40% right internal carotid stenosis and stable moderate 40-60% left internal carotid stenosis. Patent vertebral and subclavian arteries.  . CARPAL TUNNEL RELEASE    . CORNEAL TRANSPLANT    . CORONARY ANGIOPLASTY WITH STENT PLACEMENT  09/2013; 05/2014    d) Promus DES 3.0 mm x 28 mm (3.2 mm)  to RCA with STEMI; residual 70% mid-distal LAD, OM 270-80%. Medical management; b) PTCA  of 75 % ISR , LAD lesion noted as ~40%  . FOOT SURGERY     left  . HERNIA REPAIR    . LEFT HEART CATHETERIZATION WITH CORONARY ANGIOGRAM N/A 10/05/2013   Procedure: LEFT HEART CATHETERIZATION WITH CORONARY ANGIOGRAM;  Surgeon: Peter M Martinique, MD;  Location: [facility] CATH LAB;  Service: Cardiovascular;  RCA 100% - PCI,CxOM stable, LAD ~70% mid;  . LEFT HEART CATHETERIZATION WITH CORONARY ANGIOGRAM N/A 06/08/2014   Procedure: LEFT HEART CATHETERIZATION WITH CORONARY ANGIOGRAM;  Surgeon: Troy Sine, MD;  Location: [facility] CATH LAB;  Service: Cardiovascular;  UA 6/'15: 75% ISR in RCA - PTCA, LAD lesion ~40%, CxOM stable.    Marland Kitchen NM MYOVIEW LTD  03/2017    - Elevatred Troponin in setting of Sepsis -- DEMAND ISCHEMIA.  Normal perfusion. LVEF 51% with normal wall motion. This is a low risk study.  Marland Kitchen PARTIAL GASTRECTOMY    . PERCUTANEOUS CORONARY INTERVENTION-BALLOON ONLY  06/08/2014   Procedure: PERCUTANEOUS CORONARY INTERVENTION-BALLOON ONLY;  Surgeon: Troy Sine, MD;  Location: [facility] CATH LAB;  Service: Cardiovascular;;  . ROTATOR CUFF REPAIR     left  . SHOULDER ARTHROSCOPY WITH ROTATOR CUFF REPAIR AND SUBACROMIAL DECOMPRESSION Right 07/01/2013   Procedure: RIGHT SHOULDER ARTHROSCOPY WITH  SUBACROMIAL DECOMPRESSION/DISTAL CLAVICLE RESECTION/ROTATOR CUFF REPAIR ;  Surgeon: Marin Shutter, MD;  Location: [place];  Service: Orthopedics;  Laterality: Right;  . SPINE SURGERY  1985   cervical laminectomy  . TOTAL KNEE ARTHROPLASTY    . TRANSTHORACIC ECHOCARDIOGRAM  09/'17; 4/'18   a) Normal EF 55-60%. Mild to moderate aortic stenosis with moderate regurgitation. Mild MR.;; b) Mild global reduction in LV systolic function: 53-29%; grade 1 DD - high LVEDP. Mild AS/AI/TR. Mild-Mod MR; moderately elevated PAP.  Marland Kitchen VAGOTOMY     partial gastrectomy    Social History   Socioeconomic History  . Marital status: Married    Spouse name: Not on file  . Number of children: Not on file  . Years of education: Not on file    . Highest education level: Not on file  Occupational History    Employer: RETIRED    Comment: Retired  Scientific laboratory technician  . Financial resource strain: Not on file  . Food insecurity:    Worry: Not on file    Inability: Not on file  . Transportation needs:    Medical: Not on file  Non-medical: Not on file  Tobacco Use  . Smoking status: Former Smoker    Types: Cigarettes    Last attempt to quit: 12/23/1964    Years since quitting: 54.0  . Smokeless tobacco: Never Used  Substance and Sexual Activity  . Alcohol use: No  . Drug use: No  . Sexual activity: Not on file  Lifestyle  . Physical activity:    Days per week: Not on file    Minutes per session: Not on file  . Stress: Not on file  Relationships  . Social connections:    Talks on phone: Not on file    Gets together: Not on file    Attends religious service: Not on file    Active member of club or organization: Not on file    Attends meetings of clubs or organizations: Not on file    Relationship status: Not on file  . Intimate partner violence:    Fear of current or ex partner: Not on file    Emotionally abused: Not on file    Physically abused: Not on file    Forced sexual activity: Not on file  Other Topics Concern  . Not on file  Social History Narrative   He is married with 2 children. He lives with his wife.   Very active at home, prior to a STEMI, he could walk several miles without discomfort. Prior to his MI, he would use his push mower to mow his entire lawn.   He is a former smoker and quit back in 1966. He does not drink alcohol    Family History  Problem Relation Age of Onset  . Heart disease Sister        Heart Transplant   . Diabetes Sister     Current Facility-Administered Medications  Medication Dose Route Frequency Provider Last Rate Last Dose  . acetaminophen (TYLENOL) suppository 650 mg  650 mg Rectal Q6H PRN Michael Boston, MD      . acetaminophen (TYLENOL) tablet 325-650 mg  325-650 mg Oral  Q6H PRN Michael Boston, MD      . alum & mag hydroxide-simeth (MAALOX/MYLANTA) 200-200-20 MG/5ML suspension 30 mL  30 mL Oral Q6H PRN Michael Boston, MD      . bisacodyl (DULCOLAX) suppository 10 mg  10 mg Rectal Q12H PRN Michael Boston, MD      . cefTRIAXone (ROCEPHIN) 2 g in sodium chloride 0.9 % 100 mL IVPB  2 g Intravenous Once Tegeler, Gwenyth Allegra, MD      . feeding supplement (ENSURE SURGERY) liquid 237 mL  237 mL Oral BID BM Michael Boston, MD      . fentaNYL (SUBLIMAZE) injection 25-50 mcg  25-50 mcg Intravenous Q1H PRN Michael Boston, MD      . guaiFENesin-dextromethorphan (ROBITUSSIN DM) 100-10 MG/5ML syrup 10 mL  10 mL Oral Q4H PRN Michael Boston, MD      . hydrocortisone (ANUSOL-HC) 2.5 % rectal cream 1 application  1 application Topical QID PRN Michael Boston, MD      . hydrocortisone cream 1 % 1 application  1 application Topical TID PRN Michael Boston, MD      . lip balm (CARMEX) ointment 1 application  1 application Topical BID Michael Boston, MD      . magic mouthwash  15 mL Oral QID PRN Michael Boston, MD      . menthol-cetylpyridinium (CEPACOL) lozenge 3 mg  1 lozenge Oral PRN Michael Boston, MD      . methocarbamol (  ROBAXIN) 1,000 mg in dextrose 5 % 50 mL IVPB  1,000 mg Intravenous Q6H PRN Michael Boston, MD      . ondansetron (ZOFRAN) injection 4 mg  4 mg Intravenous Q6H PRN Michael Boston, MD       Or  . ondansetron (ZOFRAN) 8 mg in sodium chloride 0.9 % 50 mL IVPB  8 mg Intravenous Q6H PRN Michael Boston, MD      . phenol (CHLORASEPTIC) mouth spray 1-2 spray  1-2 spray Mouth/Throat PRN Michael Boston, MD      . prochlorperazine (COMPAZINE) injection 5-10 mg  5-10 mg Intravenous Q4H PRN Michael Boston, MD       Current Outpatient Medications  Medication Sig Dispense Refill  . acetaminophen (TYLENOL) 325 MG tablet Take 650 mg by mouth every 6 (six) hours as needed for mild pain or headache.    . brinzolamide (AZOPT) 1 % ophthalmic suspension Place 1 drop into the left eye every 12  (twelve) hours.     Marland Kitchen CALCIUM PO Take 1 tablet by mouth every other day.    . metoprolol succinate (TOPROL-XL) 25 MG 24 hr tablet Take 1 tablet (25 mg total) by mouth daily. 30 tablet 6  . nitroGLYCERIN (NITROSTAT) 0.4 MG SL tablet DISSOLVE 1 TABLET UNDER THE TONGUE EVERY 5 MINUTES AS NEEDED FOR CHEST PAIN (Patient taking differently: Place 0.4 mg under the tongue every 5 (five) minutes as needed for chest pain. DISSOLVE 1 TABLET UNDER THE TONGUE EVERY 5 MINUTES AS NEEDED FOR CHEST PAIN) 25 tablet 12  . torsemide (DEMADEX) 20 MG tablet Take 40 mg by mouth 2 (two) times daily.     . TRAVATAN Z 0.004 % SOLN ophthalmic solution Place 1 drop into both eyes at bedtime.     Marland Kitchen aspirin EC 81 MG tablet Take 1 tablet (81 mg total) by mouth daily. (Patient not taking: Reported on 01/01/2019) 90 tablet 3     Allergies  Allergen Reactions  . Amlodipine Besylate Hives  . Hydralazine Itching  . Lipitor [Atorvastatin] Itching  . Naproxen Diarrhea  . Oxycodone-Acetaminophen Itching  . Isosorbide Rash    ROS:   All other systems reviewed & are negative except per HPI or as noted below: Constitutional:  No fevers, chills, sweats.  Weight stable Eyes:  No vision changes, No discharge HENT:  No sore throats, nasal drainage Lymph: No neck swelling, No bruising easily Pulmonary:  No cough, productive sputum CV: No orthopnea, PND  Patient walks 20 minutes for about .5 miles without difficulty.  No exertional chest/neck/shoulder/arm pain. GI:  No personal nor family history of GI/colon cancer, inflammatory bowel disease, irritable bowel syndrome, allergy such as Celiac Sprue, dietary/dairy problems, colitis.  No recent sick contacts/gastroenteritis.  No travel outside the country.  No changes in diet. Renal: No UTIs, No hematuria Genital:  No drainage, bleeding, masses Musculoskeletal: No severe joint pain.  Good ROM major joints Skin:  No sores or lesions.  No rashes Heme/Lymph:  No easy bleeding.  No swollen  lymph nodes Neuro: No focal weakness/numbness.  No seizures Psych: No suicidal ideation.  No hallucinations  BP (!) 211/77   Pulse 81   Resp 20   Ht 5\' 7"  (1.702 m)   Wt 45.4 kg   SpO2 96%   BMI 15.66 kg/m   Physical Exam: General: Pt awake/alert/oriented x4 in mild acute distress.  Rather thin.  Not frankly cachectic though Eyes: PERRL, normal EOM. Sclera nonicteric Neuro: CN II-XII intact w/o focal sensory/motor deficits.  Lymph: No head/neck/groin lymphadenopathy Psych:  No delerium/psychosis/paranoia HENT: Normocephalic, Mucus membranes moist.  No thrush Neck: Supple, No tracheal deviation Chest: No pain.  Good respiratory excursion. CV:  Pulses intact.  Regular rhythm Abdomen: Somewhat firm.  Positive Murphy sign right upper quadrant with guarding.  Left side of the abdomen nontender.  Midline incision with no hernia.  Gen:  No inguinal hernias.  No inguinal lymphadenopathy.   Ext:  SCDs BLE.  No significant edema.  No cyanosis.  Left index finger absent Skin: No petechiae / purpurea.  No major sores Musculoskeletal: No severe joint pain.  Good ROM major joints   Results:   Labs: Results for orders placed or performed during the hospital encounter of 01/01/19 (from the past 48 hour(s))  CBC with Differential/Platelet     Status: Abnormal   Collection Time: 01/01/19 11:50 AM  Result Value Ref [place]   WBC 11.2 (H) 4.0 - 10.5 K/uL   RBC 3.95 (L) 4.22 - 5.81 MIL/uL   Hemoglobin 13.0 13.0 - 17.0 g/dL   HCT 38.5 (L) 39.0 - 52.0 %   MCV 97.5 80.0 - 100.0 fL   MCH 32.9 26.0 - 34.0 pg   MCHC 33.8 30.0 - 36.0 g/dL   RDW 13.1 11.5 - 15.5 %   Platelets 92 (L) 150 - 400 K/uL    Comment: Immature Platelet Fraction may be clinically indicated, consider ordering this additional test [id number]    nRBC 0.0 0.0 - 0.2 %   Neutrophils Relative % 87 %   Neutro Abs 9.7 (H) 1.7 - 7.7 K/uL   Lymphocytes Relative 4 %   Lymphs Abs 0.4 (L) 0.7 - 4.0 K/uL   Monocytes Relative 9 %    Monocytes Absolute 1.0 0.1 - 1.0 K/uL   Eosinophils Relative 0 %   Eosinophils Absolute 0.0 0.0 - 0.5 K/uL   Basophils Relative 0 %   Basophils Absolute 0.0 0.0 - 0.1 K/uL   Immature Granulocytes 0 %   Abs Immature Granulocytes 0.05 0.00 - 0.07 K/uL    Comment: Performed at [facility], [place] [address] [place], [place] [address]  Comprehensive metabolic panel     Status: Abnormal   Collection Time: 01/01/19 11:50 AM  Result Value Ref [place]   Sodium 142 135 - 145 mmol/L   Potassium 4.7 3.5 - 5.1 mmol/L   Chloride 108 98 - 111 mmol/L   CO2 23 22 - 32 mmol/L   Glucose, Bld 152 (H) 70 - 99 mg/dL   BUN 46 (H) 8 - 23 mg/dL   Creatinine, Ser 1.75 (H) 0.61 - 1.24 mg/dL   Calcium 9.4 8.9 - 10.3 mg/dL   Total Protein 7.3 6.5 - 8.1 g/dL   Albumin 4.3 3.5 - 5.0 g/dL   AST 20 15 - 41 U/L   ALT 9 0 - 44 U/L   Alkaline Phosphatase 90 38 - 126 U/L   Total Bilirubin 1.4 (H) 0.3 - 1.2 mg/dL   GFR calc non Af Amer 34 (L) >60 mL/min   GFR calc Af Amer 40 (L) >60 mL/min   Anion gap 11 5 - 15    Comment: Performed at [facility], [place] [address], [place], [place] [address]  Lipase, blood     Status: None   Collection Time: 01/01/19 11:50 AM  Result Value Ref [place]   Lipase 41 11 - 51 U/L    Comment: Performed at [facility], [place] [address] [place], [place]  [address]  I-stat troponin, ED     Status: None   Collection Time: 01/01/19 12:04 PM  Result Value Ref [place]   Troponin i, poc 0.02 0.00 - 0.08 ng/mL   Comment 3            Comment: Due to the release kinetics of cTnI, a negative result within the first hours of the onset of symptoms does not rule out myocardial infarction with certainty. If myocardial infarction is still suspected, repeat the test at appropriate intervals.     Imaging / Studies: Ct Abdomen Pelvis Wo Contrast  Result Date: 01/01/2019 CLINICAL DATA:  85 year old male with history of left lower quadrant  abdominal pain radiating to the back. EXAM: CT ABDOMEN AND PELVIS WITHOUT CONTRAST TECHNIQUE: Multidetector CT imaging of the abdomen and pelvis was performed following the standard protocol without IV contrast. COMPARISON:  CT the abdomen and pelvis 05/07/2018. FINDINGS: Lower chest: Aortic atherosclerosis. Calcified atherosclerotic plaque in the right coronary artery. Calcifications of the mitral annulus. Surgical clips adjacent to the gastroesophageal junction. Hepatobiliary: No definite cystic or solid hepatic lesions are confidently identified on today's noncontrast CT examination. Small calcified gallstones in the gallbladder. No findings to suggest an acute cholecystitis at this time. Pancreas: No definite pancreatic mass or peripancreatic fluid or inflammatory changes are noted on today's noncontrast CT examination. Spleen: Unremarkable. Adrenals/Urinary Tract: There are no abnormal calcifications within the collecting system of either kidney, along the course of either ureter, or within the lumen of the urinary bladder. No hydroureteronephrosis or perinephric stranding to suggest urinary tract obstruction at this time. The unenhanced appearance of the kidneys is unremarkable bilaterally. Unenhanced appearance of the urinary bladder is normal. Bilateral adrenal glands are normal in appearance. Stomach/Bowel: Unenhanced appearance of the stomach is normal. No pathologic dilatation of small bowel or colon. Numerous colonic diverticulae are noted, without surrounding inflammatory changes to suggest an acute diverticulitis at this time. The appendix is not confidently identified and may be surgically absent. Regardless, there are no inflammatory changes noted adjacent to the cecum to suggest the presence of an acute appendicitis at this time. Vascular/Lymphatic: Aortic atherosclerosis. No lymphadenopathy confidently identified in the abdomen or pelvis on today's noncontrast CT examination. Reproductive: Prostate  gland and seminal vesicles are unremarkable in appearance. Other: No significant volume of ascites.  No pneumoperitoneum. Musculoskeletal: There are no aggressive appearing lytic or blastic lesions noted in the visualized portions of the skeleton. IMPRESSION: 1. No acute findings are noted in the abdomen or pelvis to account for the patient's symptoms. 2. Severe colonic diverticulosis without definite surrounding inflammatory changes to indicate an acute diverticulitis at this time. 3. Cholelithiasis without evidence of acute cholecystitis. 4. Aortic atherosclerosis, in addition to least right coronary artery disease. 5. There are calcifications of the mitral annulus. Echocardiographic correlation for evaluation of potential valvular dysfunction may be warranted if clinically indicated. Electronically Signed   By: Vinnie Langton M.D.   On: 01/01/2019 14:36   US Abdomen Limited Ruq  Result Date: 01/01/2019 CLINICAL DATA:  Upper abdominal pain EXAM: ULTRASOUND ABDOMEN LIMITED RIGHT UPPER QUADRANT COMPARISON:  CT from earlier in the same day. FINDINGS: Gallbladder: Gallbladder is well distended with wall thickening to 5.7 mm and edema within the gallbladder wall. Gallstones are noted. Positive sonographic Murphy's sign is noted. Common bile duct: Diameter: 10 mm Liver: Liver is within normal limits with the exception of a 1 cm hyperechoic focus within the left lobe consistent with a small hemangioma. Portal vein is patent  on color Doppler imaging with normal direction of blood flow towards the liver. IMPRESSION: Changes consistent with acute calculous cholecystitis in the appropriate clinical setting. Hyperechoic focus within the left lobe of the liver most consistent with small hemangioma. Electronically Signed   By: Inez Catalina M.D.   On: 01/01/2019 16:34    Medications / Allergies: per chart  Antibiotics: Anti-infectives (From admission, onward)   Start     Dose/Rate Route Frequency Ordered Stop    01/01/19 1730  cefTRIAXone (ROCEPHIN) 2 g in sodium chloride 0.9 % 100 mL IVPB     2 g 200 mL/hr over 30 Minutes Intravenous  Once 01/01/19 1728          Note: Portions of this report may have been transcribed using voice recognition software. Every effort was made to ensure accuracy; however, inadvertent computerized transcription errors may be present.   Any transcriptional errors that result from this process are unintentional.    Adin Hector, MD, FACS, MASCRS Gastrointestinal and Minimally Invasive Surgery    [address] [place], [place] [address] [phone] Main / Paging [phone] Fax   01/01/2019

## 2019-01-01 NOTE — ED Notes (Signed)
Lab at bedside. Will transport to room when finished.

## 2019-01-01 NOTE — ED Notes (Addendum)
ADMISSION COURAGE MD bedside. AWARE OF ELEVATED BP AND INTERVENTIONS TAKEN SINCE ARRIVAL TO ED. NO ORDERS GIVEN. WILL ADDRESS IN ADDITIONAL ADMISSION ORDERS

## 2019-01-01 NOTE — ED Notes (Signed)
Dr. Joesph Fillers at bedside with RN Keshonda Monsour Leory Plowman. Pt stated he would NOT like to be resuscitated if his heart stops at any point during his hospital stay.

## 2019-01-01 NOTE — ED Notes (Signed)
PER WIFE PT DID NOT TAKE ANY OF HIS BP MEDICATION THIS AM

## 2019-01-01 NOTE — H&P (Signed)
Patient Demographics:    Nicholas Porter, is a 85 y.o. male  MRN: [id number]   DOB - 1930/09/04  Admit Date - 01/01/2019  Outpatient Primary MD for the patient is Dorothyann Peng, NP   Assessment & Plan:    Principal Problem:   Acute cholecystitis Active Problems:   Essential hypertension   CAD S/P percutaneous coronary angioplasty - DES in RCA with PTCA for ISR; Moderate mLAD, 80% ostial OM2 stable   H/O Inferior STEMI (09/2013) emergent PCI with Promus DES to RCA (3.0 mm  x 28 mm - 3.2 mm)   GERD (gastroesophageal reflux disease)   Malnutrition of moderate degree   Acute calculous cholecystitis   CKD (chronic kidney disease) stage 3, GFR 30-59 ml/min (HCC)   Unintentional weight loss   History of medication noncompliance   History of partial gastrectomy    1) acute calculus cholecystitis-----surgical consult from Dr. Johney Maine, for possible lap chole on 01/02/2019, I see  no significant contraindications to surgery as long as BP control is improved, however EKG showed QTC is prolonged at 530 need to be cautious with QT prolonging medications--- n.p.o. after midnight, PRN antiemetics and pain medications----antibiotics as per general surgery  2)HTN--stage II, SBP was over 200 mmHg in ED, suspect due to uncontrolled abdominal pain, okay to use p.o. Imdur 30 mg daily along with hydralazine 25 mg 3 times daily,, metoprolol 25 mg twice daily,   may use IV labetalol when necessary  Every 4 hours for systolic blood pressure over 160 mmhg  3)Social/Ethics--- Patient and his wife both admit that he is a DNR, RN Alex verified DNR status  4)HFrEF--- patient with history of combined systolic and diastolic dysfunction CHF, last known EF 45 to 50% in the setting of known CAD and possible ischemic cardiomyopathy--- patient is  largely asymptomatic at this time, patient is clinically dry at this time due to persistent nausea and abdominal pain with poor oral intake in the setting of acute cholecystitis,  be judicious with IV fluids to avoid volume overload, continue metoprolol, isosorbide/hydralazine combo  5)H/o CAD and PAD-- CAD with prior angioplasty and stenting, PAD with prior left carotid endodiathermy , patient apparently is intolerant to Lipitor trial low-dose pravastatin, restart aspirin postop,--metoprolol, no ACS type symptoms at this time, EKG without ischemic changes  6)CKDIII--renal function appears stable at this time, with a creatinine of 1.75, creatinine in May 2019 was actually 1.9,   , renally adjust medications, avoid nephrotoxic agents/dehydration/hypotension  7) anorexia and weight loss----consider GI consult for endoluminal evaluation, give Remeron for appetite stimulation and sleep, nutritional consult ordered  8) COPD/emphysema----quit smoking about 50 years ago, may use PRN albuterol, no acute flareup of COPD at this time  With History of - Reviewed by me  Past Medical History:  Diagnosis Date  . Acute metabolic encephalopathy 03/28/5680  . Acute renal failure superimposed on stage 4 chronic kidney disease ([place]) 11/27/2015  . Anemia of chronic renal  failure, stage 4 (severe) ([place]) 11/27/2015  . 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 CAD S/P percutaneous coronary angioplasty - DES in RCA with PTCA for ISR; Moderate mLAD, 80% ostial OM2 stable 05/22/2008   Qualifier: Diagnosis of  By: Linda Hedges MD, Heinz Knuckles   . CKD (chronic kidney disease) stage 3, GFR 30-59 ml/min ([place])   . COPD (chronic obstructive pulmonary disease) ([place])   . Essential hypertension 06/30/2007   Qualifier: Diagnosis of  By: Cori Razor RN, Mikal Plane Eye abnormalities    injections in eyes 12/2017  . H/O Inferior STEMI (09/2013) emergent PCI  with Promus DES to RCA (3.0 mm  x 28 mm - 3.2 mm) 10/05/2013  . Klebsiella sepsis ([place]) 04/02/2017  . Left-sided carotid artery disease -- s/p CEA 04/06/2006   S/p Left CEA   . Mitral regurgitation - onexam 09/03/2016      Past Surgical History:  Procedure Laterality Date  . AMPUTATION     left index finger -tramatic  . CARDIAC CATHETERIZATION  November 2011   40% mid LAD, 50% proximal RCA, 30-40% distal RCA (DOMINANT RCA with wraparound PDA & 2 large PLBs), 60-70% ostial OM1. No change from 2001. Medical therapy  . CAROTID ENDARTERECTOMY Left 2007   Dr.Hayes: 08/2016 Dopplers: Stable less than 40% right internal carotid stenosis and stable moderate 40-60% left internal carotid stenosis. Patent vertebral and subclavian arteries.  . CARPAL TUNNEL RELEASE    . CORNEAL TRANSPLANT    . CORONARY ANGIOPLASTY WITH STENT PLACEMENT  09/2013; 05/2014    d) Promus DES 3.0 mm x 28 mm (3.2 mm)  to RCA with STEMI; residual 70% mid-distal LAD, OM 270-80%. Medical management; b) PTCA of 75 % ISR , LAD lesion noted as ~40%  . FOOT SURGERY     left  . HERNIA REPAIR    . LEFT HEART CATHETERIZATION WITH CORONARY ANGIOGRAM N/A 10/05/2013   Procedure: LEFT HEART CATHETERIZATION WITH CORONARY ANGIOGRAM;  Surgeon: Peter M Martinique, MD;  Location: [facility] CATH LAB;  Service: Cardiovascular;  RCA 100% - PCI,CxOM stable, LAD ~70% mid;  . LEFT HEART CATHETERIZATION WITH CORONARY ANGIOGRAM N/A 06/08/2014   Procedure: LEFT HEART CATHETERIZATION WITH CORONARY ANGIOGRAM;  Surgeon: Troy Sine, MD;  Location: [facility] CATH LAB;  Service: Cardiovascular;  UA 6/'15: 75% ISR in RCA - PTCA, LAD lesion ~40%, CxOM stable.    Marland Kitchen NM MYOVIEW LTD  03/2017    - Elevatred Troponin in setting of Sepsis -- DEMAND ISCHEMIA.  Normal perfusion. LVEF 51% with normal wall motion. This is a low risk study.  Marland Kitchen PARTIAL GASTRECTOMY    . PERCUTANEOUS CORONARY INTERVENTION-BALLOON ONLY  06/08/2014   Procedure: PERCUTANEOUS CORONARY INTERVENTION-BALLOON ONLY;   Surgeon: Troy Sine, MD;  Location: [facility] CATH LAB;  Service: Cardiovascular;;  . ROTATOR CUFF REPAIR     left  . SHOULDER ARTHROSCOPY WITH ROTATOR CUFF REPAIR AND SUBACROMIAL DECOMPRESSION Right 07/01/2013   Procedure: RIGHT SHOULDER ARTHROSCOPY WITH  SUBACROMIAL DECOMPRESSION/DISTAL CLAVICLE RESECTION/ROTATOR CUFF REPAIR ;  Surgeon: Marin Shutter, MD;  Location: [facility];  Service: Orthopedics;  Laterality: Right;  . SPINE SURGERY  1985   cervical laminectomy  . TOTAL KNEE ARTHROPLASTY    . TRANSTHORACIC ECHOCARDIOGRAM  09/'17; 4/'18   a) Normal EF 55-60%. Mild to moderate aortic stenosis with moderate regurgitation. Mild MR.;; b) Mild global reduction in LV systolic function: 82-42%; grade 1 DD -  high LVEDP. Mild AS/AI/TR. Mild-Mod MR; moderately elevated PAP.  Marland Kitchen VAGOTOMY     partial gastrectomy      Chief Complaint  Patient presents with  . Abdominal Pain  . Nausea      HPI:    Emad Brechtel  is a 85 y.o. male has medical history relevant for CAD with prior angioplasty and stenting, PAD with prior left carotid endodiathermy, COPD/ patient is a reformed smoker who quit smoking about 50 years ago as well as h/o partial gastrectomy/vagatomy in distant past for ulcer.  LAst EGD 2009 with mild gastritis ,CKDIII , unintentional weight loss 60 lbs last year,  with c/o epigastric pain last night with retching/N/V----  Patient is a poor historian, additional history obtained from patient's wife and neighbor at bedside  Patient and his wife both admit that he is a DNR, RN Alex verified DNR status   no fevers or chills, no hematemesis, no hematochezia,  Patient repeatedly denies chest pain, palpitations, dizziness or shortness of breath  In ED--- chest x-ray with emphysema/COPD, abdominal ultrasound with acute calculus cholecystitis, abdomen and pelvis with cholelithiasis, mitral valve annulus calcifications, severe colonic diverticulosis without acute diverticulitis  In ED creatinine is  1.75 which is less than his previous baseline of 1.9 back in May 2019, elevated, white count is 11.2, globin is 13.0 with a platelet count of 92  In ED BP was elevated with systolic blood pressure over 200----improved after intervention and pain management of abdominal pain  Surgical consult was requested by EDP, general surgeon Dr. Johney Maine requested hospitalist admission with surgical consult for lap chole    Review of systems:    In addition to the HPI above,   A full Review of  Systems was done, all other systems reviewed are negative except as noted above in HPI , .    Social History:  Reviewed by me    Social History   Tobacco Use  . Smoking status: Former Smoker    Types: Cigarettes    Last attempt to quit: 12/23/1964    Years since quitting: 54.0  . Smokeless tobacco: Never Used  Substance Use Topics  . Alcohol use: No       Family History :  Reviewed by me    Family History  Problem Relation Age of Onset  . Heart disease Sister        Heart Transplant   . Diabetes Sister       Home Medications:   Prior to Admission medications   Medication Sig Start Date End Date Taking? Authorizing Provider  acetaminophen (TYLENOL) 325 MG tablet Take 650 mg by mouth every 6 (six) hours as needed for mild pain or headache.   Yes [provider]  brinzolamide (AZOPT) 1 % ophthalmic suspension Place 1 drop into the left eye every 12 (twelve) hours.    Yes [provider]  CALCIUM PO Take 1 tablet by mouth every other day.   Yes [provider]  metoprolol succinate (TOPROL-XL) 25 MG 24 hr tablet Take 1 tablet (25 mg total) by mouth daily. 04/22/18  Yes Leonie Man, MD  nitroGLYCERIN (NITROSTAT) 0.4 MG SL tablet DISSOLVE 1 TABLET UNDER THE TONGUE EVERY 5 MINUTES AS NEEDED FOR CHEST PAIN Patient taking differently: Place 0.4 mg under the tongue every 5 (five) minutes as needed for chest pain. DISSOLVE 1 TABLET UNDER THE TONGUE EVERY 5 MINUTES AS  NEEDED FOR CHEST PAIN 08/21/17  Yes Leonie Man, MD  torsemide (  DEMADEX) 20 MG tablet Take 40 mg by mouth 2 (two) times daily.    Yes Penninger, Ria Comment, [place]  TRAVATAN Z 0.004 % SOLN ophthalmic solution Place 1 drop into both eyes at bedtime.  04/20/13  Yes [provider]  aspirin EC 81 MG tablet Take 1 tablet (81 mg total) by mouth daily. Patient not taking: Reported on 01/01/2019 04/23/17   Leonie Man, MD     Allergies:     Allergies  Allergen Reactions  . Amlodipine Besylate Hives  . Hydralazine Itching  . Lipitor [Atorvastatin] Itching  . Naproxen Diarrhea  . Oxycodone-Acetaminophen Itching  . Isosorbide Rash     Physical Exam:   Vitals  Blood pressure (!) 230/89, pulse 81, temperature 99.2 F (37.3 C), temperature source Oral, resp. rate 16, height 5\' 7"  (1.702 m), weight 43.4 kg, SpO2 100 %.  Physical Examination: General appearance - alert, appears uncomfortable with abdominal pain mental status - alert, oriented to person, place, and time,  Eyes - sclera anicteric Neck - supple, no JVD elevation , Chest - clear  to auscultation bilaterally, symmetrical air movement,  Heart - S1 and S2 normal, regular  Abdomen - soft,   nondistended, epigastric and right upper quadrant tenderness without rebound or guarding Neurological - screening mental status exam normal, neck supple without rigidity, cranial nerves II through XII intact, DTR's normal and symmetric Extremities - no pedal edema noted, intact peripheral pulses  Skin - warm, dry     Data Review:    CBC Recent Labs  Lab 01/01/19 1150  WBC 11.2*  HGB 13.0  HCT 38.5*  PLT 92*  MCV 97.5  MCH 32.9  MCHC 33.8  RDW 13.1  LYMPHSABS 0.4*  MONOABS 1.0  EOSABS 0.0  BASOSABS 0.0   ------------------------------------------------------------------------------------------------------------------  Chemistries  Recent Labs  Lab 01/01/19 1150  NA 142  K 4.7  CL 108  CO2 23  GLUCOSE 152*  BUN  46*  CREATININE 1.75*  CALCIUM 9.4  AST 20  ALT 9  ALKPHOS 90  BILITOT 1.4*   ------------------------------------------------------------------------------------------------------------------ estimated creatinine clearance is 18.6 mL/min (A) (by C-G formula based on SCr of 1.75 mg/dL (H)). ------------------------------------------------------------------------------------------------------------------ No results for input(s): TSH, T4TOTAL, T3FREE, THYROIDAB in the last 72 hours.  Invalid input(s): FREET3   Coagulation profile No results for input(s): INR, PROTIME in the last 168 hours. ------------------------------------------------------------------------------------------------------------------- No results for input(s): DDIMER in the last 72 hours. -------------------------------------------------------------------------------------------------------------------  Cardiac Enzymes No results for input(s): CKMB, TROPONINI, MYOGLOBIN in the last 168 hours.  Invalid input(s): CK ------------------------------------------------------------------------------------------------------------------ No results found for: BNP   ---------------------------------------------------------------------------------------------------------------  Urinalysis    Component Value Date/Time   COLORURINE YELLOW 04/15/2017 1718   APPEARANCEUR Sl Cloudy (A) 04/15/2017 1718   LABSPEC 1.025 04/15/2017 1718   PHURINE 5.5 04/15/2017 1718   GLUCOSEU NEGATIVE 04/15/2017 1718   HGBUR TRACE-INTACT (A) 04/15/2017 1718   HGBUR negative 12/06/2009 0836   BILIRUBINUR NEGATIVE 04/15/2017 1718   BILIRUBINUR negative 02/13/2017 1025   KETONESUR NEGATIVE 04/15/2017 1718   PROTEINUR NEGATIVE 04/01/2017 1702   UROBILINOGEN 0.2 04/15/2017 1718   NITRITE NEGATIVE 04/15/2017 1718   LEUKOCYTESUR SMALL (A) 04/15/2017 1718     ----------------------------------------------------------------------------------------------------------------   Imaging Results:    Ct Abdomen Pelvis Wo Contrast  Result Date: 01/01/2019 CLINICAL DATA:  85 year old male with history of left lower quadrant abdominal pain radiating to the back. EXAM: CT ABDOMEN AND PELVIS WITHOUT CONTRAST TECHNIQUE: Multidetector CT imaging of the abdomen and pelvis was performed following the  standard protocol without IV contrast. COMPARISON:  CT the abdomen and pelvis 05/07/2018. FINDINGS: Lower chest: Aortic atherosclerosis. Calcified atherosclerotic plaque in the right coronary artery. Calcifications of the mitral annulus. Surgical clips adjacent to the gastroesophageal junction. Hepatobiliary: No definite cystic or solid hepatic lesions are confidently identified on today's noncontrast CT examination. Small calcified gallstones in the gallbladder. No findings to suggest an acute cholecystitis at this time. Pancreas: No definite pancreatic mass or peripancreatic fluid or inflammatory changes are noted on today's noncontrast CT examination. Spleen: Unremarkable. Adrenals/Urinary Tract: There are no abnormal calcifications within the collecting system of either kidney, along the course of either ureter, or within the lumen of the urinary bladder. No hydroureteronephrosis or perinephric stranding to suggest urinary tract obstruction at this time. The unenhanced appearance of the kidneys is unremarkable bilaterally. Unenhanced appearance of the urinary bladder is normal. Bilateral adrenal glands are normal in appearance. Stomach/Bowel: Unenhanced appearance of the stomach is normal. No pathologic dilatation of small bowel or colon. Numerous colonic diverticulae are noted, without surrounding inflammatory changes to suggest an acute diverticulitis at this time. The appendix is not confidently identified and may be surgically absent. Regardless, there are no inflammatory  changes noted adjacent to the cecum to suggest the presence of an acute appendicitis at this time. Vascular/Lymphatic: Aortic atherosclerosis. No lymphadenopathy confidently identified in the abdomen or pelvis on today's noncontrast CT examination. Reproductive: Prostate gland and seminal vesicles are unremarkable in appearance. Other: No significant volume of ascites.  No pneumoperitoneum. Musculoskeletal: There are no aggressive appearing lytic or blastic lesions noted in the visualized portions of the skeleton. IMPRESSION: 1. No acute findings are noted in the abdomen or pelvis to account for the patient's symptoms. 2. Severe colonic diverticulosis without definite surrounding inflammatory changes to indicate an acute diverticulitis at this time. 3. Cholelithiasis without evidence of acute cholecystitis. 4. Aortic atherosclerosis, in addition to least right coronary artery disease. 5. There are calcifications of the mitral annulus. Echocardiographic correlation for evaluation of potential valvular dysfunction may be warranted if clinically indicated. Electronically Signed   By: Vinnie Langton M.D.   On: 01/01/2019 14:36   Dg Chest 2 View  Result Date: 01/01/2019 CLINICAL DATA:  Medical clearance for cholecystitis EXAM: CHEST - 2 VIEW COMPARISON:  03/27/2018 FINDINGS: Emphysematous hyperinflation of the lungs without acute pulmonary consolidation. Normal heart size and mediastinal contours with aortic atherosclerosis. Vascular clip projects over the base of the neck on the left. 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 IMPRESSION: Emphysematous hyperinflation of the lungs without acute pulmonary disease. Electronically Signed   By: Ashley Royalty M.D.   On: 01/01/2019 18:03   US Abdomen Limited Ruq  Result Date: 01/01/2019 CLINICAL DATA:  Upper abdominal pain EXAM: ULTRASOUND ABDOMEN LIMITED RIGHT UPPER QUADRANT COMPARISON:  CT  from earlier in the same day. FINDINGS: Gallbladder: Gallbladder is well distended with wall thickening to 5.7 mm and edema within the gallbladder wall. Gallstones are noted. Positive sonographic Murphy's sign is noted. Common bile duct: Diameter: 10 mm Liver: Liver is within normal limits with the exception of a 1 cm hyperechoic focus within the left lobe consistent with a small hemangioma. Portal vein is patent on color Doppler imaging with normal direction of blood flow towards the liver. IMPRESSION: Changes consistent with acute calculous cholecystitis in the appropriate clinical setting. Hyperechoic focus within the left lobe of the liver most consistent with small hemangioma. Electronically Signed   By:  Inez Catalina M.D.   On: 01/01/2019 16:34    Radiological Exams on Admission: Ct Abdomen Pelvis Wo Contrast  Result Date: 01/01/2019 CLINICAL DATA:  85 year old male with history of left lower quadrant abdominal pain radiating to the back. EXAM: CT ABDOMEN AND PELVIS WITHOUT CONTRAST TECHNIQUE: Multidetector CT imaging of the abdomen and pelvis was performed following the standard protocol without IV contrast. COMPARISON:  CT the abdomen and pelvis 05/07/2018. FINDINGS: Lower chest: Aortic atherosclerosis. Calcified atherosclerotic plaque in the right coronary artery. Calcifications of the mitral annulus. Surgical clips adjacent to the gastroesophageal junction. Hepatobiliary: No definite cystic or solid hepatic lesions are confidently identified on today's noncontrast CT examination. Small calcified gallstones in the gallbladder. No findings to suggest an acute cholecystitis at this time. Pancreas: No definite pancreatic mass or peripancreatic fluid or inflammatory changes are noted on today's noncontrast CT examination. Spleen: Unremarkable. Adrenals/Urinary Tract: There are no abnormal calcifications within the collecting system of either kidney, along the course of either ureter, or within the lumen of  the urinary bladder. No hydroureteronephrosis or perinephric stranding to suggest urinary tract obstruction at this time. The unenhanced appearance of the kidneys is unremarkable bilaterally. Unenhanced appearance of the urinary bladder is normal. Bilateral adrenal glands are normal in appearance. Stomach/Bowel: Unenhanced appearance of the stomach is normal. No pathologic dilatation of small bowel or colon. Numerous colonic diverticulae are noted, without surrounding inflammatory changes to suggest an acute diverticulitis at this time. The appendix is not confidently identified and may be surgically absent. Regardless, there are no inflammatory changes noted adjacent to the cecum to suggest the presence of an acute appendicitis at this time. Vascular/Lymphatic: Aortic atherosclerosis. No lymphadenopathy confidently identified in the abdomen or pelvis on today's noncontrast CT examination. Reproductive: Prostate gland and seminal vesicles are unremarkable in appearance. Other: No significant volume of ascites.  No pneumoperitoneum. Musculoskeletal: There are no aggressive appearing lytic or blastic lesions noted in the visualized portions of the skeleton. IMPRESSION: 1. No acute findings are noted in the abdomen or pelvis to account for the patient's symptoms. 2. Severe colonic diverticulosis without definite surrounding inflammatory changes to indicate an acute diverticulitis at this time. 3. Cholelithiasis without evidence of acute cholecystitis. 4. Aortic atherosclerosis, in addition to least right coronary artery disease. 5. There are calcifications of the mitral annulus. Echocardiographic correlation for evaluation of potential valvular dysfunction may be warranted if clinically indicated. Electronically Signed   By: Vinnie Langton M.D.   On: 01/01/2019 14:36   Dg Chest 2 View  Result Date: 01/01/2019 CLINICAL DATA:  Medical clearance for cholecystitis EXAM: CHEST - 2 VIEW COMPARISON:  03/27/2018  FINDINGS: Emphysematous hyperinflation of the lungs without acute pulmonary consolidation. Normal heart size and mediastinal contours with aortic atherosclerosis. Vascular clip projects over the base of the neck on the left. 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 IMPRESSION: Emphysematous hyperinflation of the lungs without acute pulmonary disease. Electronically Signed   By: Ashley Royalty M.D.   On: 01/01/2019 18:03   US Abdomen Limited Ruq  Result Date: 01/01/2019 CLINICAL DATA:  Upper abdominal pain EXAM: ULTRASOUND ABDOMEN LIMITED RIGHT UPPER QUADRANT COMPARISON:  CT from earlier in the same day. FINDINGS: Gallbladder: Gallbladder is well distended with wall thickening to 5.7 mm and edema within the gallbladder wall. Gallstones are noted. Positive sonographic Murphy's sign is noted. Common bile duct: Diameter: 10 mm Liver: Liver is within normal limits with the exception of  a 1 cm hyperechoic focus within the left lobe consistent with a small hemangioma. Portal vein is patent on color Doppler imaging with normal direction of blood flow towards the liver. IMPRESSION: Changes consistent with acute calculous cholecystitis in the appropriate clinical setting. Hyperechoic focus within the left lobe of the liver most consistent with small hemangioma. Electronically Signed   By: Inez Catalina M.D.   On: 01/01/2019 16:34    DVT Prophylaxis -SCD  /heparin postop AM Labs Ordered, also please review Full Orders  Family Communication: Admission, patients condition and plan of care including tests being ordered have been discussed with the patient and wife who indicate understanding and agree with the plan   Code Status - DNR Likely DC to  home  Condition   --stable  Roxan Hockey M.D on 01/01/2019 at 8:40 PM Go to [email or web address] -  for contact info  [facility] - Office  [phone]

## 2019-01-01 NOTE — ED Provider Notes (Addendum)
3:50 PM Care assumed with Dr. Maryan Rued.  At time of transfer care, patient is awaiting results of right upper quadrant ultrasound.  CT scan for the abdominal discomfort, nausea, vomiting showed concern for cholelithiasis but no acute cholecystitis.  If ultrasound shows cholecystitis, anticipate admission.  If patient's pain is improved and only confirms the stones, patient may be candidate for outpatient management with outpatient surgical follow-up for symptomatic cholelithiasis.  Patient be given his home blood pressure medicine to help with his elevated blood pressure as he did not take this morning.  Anticipate reassessment.  4:40 PM Ultrasound shows evidence of acute cholecystitis.  General surgery will be called for further management.  Patient was made n.p.o.  5:29 PM Spoke with general surgery who will come see the patient in consultation.  Due to his other medical problems, his elevated blood pressure, and kidney disease, they request admission to medicine.  Medicine consult placed.  They recommended antibiotics with Rocephin which was ordered.  Patient will blood cultures.  Patient will be admitted for further management.    Clinical Impression: 1. Cholecystitis, acute   2. Upper abdominal pain     Disposition: Admit  This note was prepared with assistance of Dragon voice recognition software. Occasional wrong-word or sound-a-like substitutions may have occurred due to the inherent limitations of voice recognition software.      Jerrin Recore, Gwenyth Allegra, MD 01/01/19 2336    Alanni Vader, Gwenyth Allegra, MD 01/01/19 2337

## 2019-01-01 NOTE — ED Notes (Signed)
ED Provider at bedside. EDP TELEGER

## 2019-01-01 NOTE — ED Notes (Signed)
ULTRASOUND AT BEDSIDE

## 2019-01-01 NOTE — ED Notes (Signed)
SURGEON AT BEDSIDE.

## 2019-01-02 ENCOUNTER — Inpatient Hospital Stay (HOSPITAL_COMMUNITY): Payer: Medicare Other | Admitting: Certified Registered Nurse Anesthetist

## 2019-01-02 ENCOUNTER — Encounter (HOSPITAL_COMMUNITY): Payer: Self-pay | Admitting: *Deleted

## 2019-01-02 ENCOUNTER — Inpatient Hospital Stay (HOSPITAL_COMMUNITY): Payer: Medicare Other

## 2019-01-02 ENCOUNTER — Encounter (HOSPITAL_COMMUNITY)
Admission: EM | Disposition: A | Discharge status: Skilled Nursing Facility | Payer: Self-pay | Source: Home / Self Care | Attending: Internal Medicine

## 2019-01-02 ENCOUNTER — Other Ambulatory Visit (HOSPITAL_COMMUNITY): Payer: Medicare Other

## 2019-01-02 DIAGNOSIS — R1310 Dysphagia, unspecified: Secondary | ICD-10-CM

## 2019-01-02 HISTORY — PX: CHOLECYSTECTOMY: SHX55

## 2019-01-02 LAB — GLUCOSE, CAPILLARY
Glucose-Capillary: 142 mg/dL — ABNORMAL HIGH (ref 70–99)
Glucose-Capillary: 148 mg/dL — ABNORMAL HIGH (ref 70–99)

## 2019-01-02 LAB — COMPREHENSIVE METABOLIC PANEL
ALBUMIN: 3.7 g/dL (ref 3.5–5.0)
ALT: 69 U/L — ABNORMAL HIGH (ref 0–44)
AST: 98 U/L — AB (ref 15–41)
Alkaline Phosphatase: 75 U/L (ref 38–126)
Anion gap: 13 (ref 5–15)
BUN: 59 mg/dL — ABNORMAL HIGH (ref 8–23)
CO2: 21 mmol/L — ABNORMAL LOW (ref 22–32)
Calcium: 8.6 mg/dL — ABNORMAL LOW (ref 8.9–10.3)
Chloride: 107 mmol/L (ref 98–111)
Creatinine, Ser: 2.33 mg/dL — ABNORMAL HIGH (ref 0.61–1.24)
GFR calc Af Amer: 28 mL/min — ABNORMAL LOW (ref >60)
GFR calc non Af Amer: 24 mL/min — ABNORMAL LOW (ref >60)
GLUCOSE: 148 mg/dL — AB (ref 70–99)
Potassium: 4.9 mmol/L (ref 3.5–5.1)
Sodium: 141 mmol/L (ref 135–145)
TOTAL PROTEIN: 6.4 g/dL — AB (ref 6.5–8.1)
Total Bilirubin: 0.9 mg/dL (ref 0.3–1.2)

## 2019-01-02 LAB — URINALYSIS, ROUTINE W REFLEX MICROSCOPIC
Bilirubin Urine: NEGATIVE
Glucose, UA: NEGATIVE mg/dL
Ketones, ur: NEGATIVE mg/dL
Leukocytes, UA: NEGATIVE
Nitrite: NEGATIVE
PH: 5 (ref 5.0–8.0)
Protein, ur: 30 mg/dL — AB
Specific Gravity, Urine: 1.008 (ref 1.005–1.030)

## 2019-01-02 LAB — CBC
HCT: 38.7 % — ABNORMAL LOW (ref 39.0–52.0)
Hemoglobin: 12.9 g/dL — ABNORMAL LOW (ref 13.0–17.0)
MCH: 33.5 pg (ref 26.0–34.0)
MCHC: 33.3 g/dL (ref 30.0–36.0)
MCV: 100.5 fL — ABNORMAL HIGH (ref 80.0–100.0)
NRBC: 0 % (ref 0.0–0.2)
Platelets: 89 10*3/uL — ABNORMAL LOW (ref 150–400)
RBC: 3.85 MIL/uL — ABNORMAL LOW (ref 4.22–5.81)
RDW: 13.7 % (ref 11.5–15.5)
WBC: 19.8 10*3/uL — ABNORMAL HIGH (ref 4.0–10.5)

## 2019-01-02 LAB — MAGNESIUM: Magnesium: 1.6 mg/dL — ABNORMAL LOW (ref 1.7–2.4)

## 2019-01-02 LAB — SURGICAL PCR SCREEN
MRSA, PCR: NEGATIVE
Staphylococcus aureus: NEGATIVE

## 2019-01-02 LAB — TROPONIN I
Troponin I: 0.05 ng/mL (ref <0.03)
Troponin I: 0.06 ng/mL (ref <0.03)
Troponin I: 0.05 ng/mL (ref <0.03)

## 2019-01-02 SURGERY — LAPAROSCOPIC CHOLECYSTECTOMY WITH INTRAOPERATIVE CHOLANGIOGRAM
Anesthesia: General | Site: Abdomen

## 2019-01-02 MED ORDER — SODIUM CHLORIDE 0.9% FLUSH: 3.0000 mL | Freq: Two times a day (BID) | INTRAVENOUS | Status: DC

## 2019-01-02 MED ORDER — CEFAZOLIN SODIUM-DEXTROSE 2-4 GM/100ML-% IV SOLN
INTRAVENOUS | Status: AC
  Filled 2019-01-02: qty 100

## 2019-01-02 MED ORDER — ACETAMINOPHEN 500 MG PO TABS
1000.0000 mg | ORAL_TABLET | Freq: Three times a day (TID) | ORAL | Status: DC
  Administered 2019-01-02 – 2019-01-05 (×11): 1000 mg via ORAL
  Filled 2019-01-02 (×11): qty 2

## 2019-01-02 MED ORDER — SUGAMMADEX SODIUM 200 MG/2ML IV SOLN
INTRAVENOUS | Status: DC | PRN
  Administered 2019-01-02: 100 mg via INTRAVENOUS

## 2019-01-02 MED ORDER — FENTANYL CITRATE (PF) 100 MCG/2ML IJ SOLN
INTRAMUSCULAR | Status: AC
  Filled 2019-01-02: qty 2

## 2019-01-02 MED ORDER — BUPIVACAINE LIPOSOME 1.3 % IJ SUSP
INTRAMUSCULAR | Status: DC | PRN
  Administered 2019-01-02: 20 mL

## 2019-01-02 MED ORDER — IOPAMIDOL (ISOVUE-300) INJECTION 61%
INTRAVENOUS | Status: AC
  Filled 2019-01-02: qty 50

## 2019-01-02 MED ORDER — SODIUM CHLORIDE 0.9 % IR SOLN
Status: DC | PRN
  Administered 2019-01-02: 1000 mL

## 2019-01-02 MED ORDER — GABAPENTIN 300 MG PO CAPS
300.0000 mg | ORAL_CAPSULE | Freq: Every day | ORAL | Status: AC
  Administered 2019-01-02 – 2019-01-04 (×3): 300 mg via ORAL
  Filled 2019-01-02 (×3): qty 1

## 2019-01-02 MED ORDER — SODIUM CHLORIDE 0.9 % IV SOLN: 250.0000 mL | INTRAVENOUS | Status: DC | PRN

## 2019-01-02 MED ORDER — DEXAMETHASONE SODIUM PHOSPHATE 4 MG/ML IJ SOLN
INTRAMUSCULAR | Status: DC | PRN
  Administered 2019-01-02: 4 mg via INTRAVENOUS

## 2019-01-02 MED ORDER — PIPERACILLIN-TAZOBACTAM IN DEX 2-0.25 GM/50ML IV SOLN: 2.2500 g | Freq: Three times a day (TID) | INTRAVENOUS | Status: DC

## 2019-01-02 MED ORDER — TRAMADOL HCL 50 MG PO TABS
50.0000 mg | ORAL_TABLET | Freq: Four times a day (QID) | ORAL | Status: DC | PRN
  Administered 2019-01-04 (×2): 50 mg via ORAL
  Administered 2019-01-06 – 2019-01-09 (×3): 100 mg via ORAL
  Filled 2019-01-02 (×2): qty 1
  Filled 2019-01-02 (×5): qty 2

## 2019-01-02 MED ORDER — LABETALOL HCL 5 MG/ML IV SOLN
5.0000 mg | INTRAVENOUS | Status: AC | PRN
  Administered 2019-01-02 (×2): 5 mg via INTRAVENOUS

## 2019-01-02 MED ORDER — ROCURONIUM BROMIDE 10 MG/ML (PF) SYRINGE
PREFILLED_SYRINGE | INTRAVENOUS | Status: DC | PRN
  Administered 2019-01-02: 10 mg via INTRAVENOUS
  Administered 2019-01-02: 30 mg via INTRAVENOUS

## 2019-01-02 MED ORDER — LACTATED RINGERS IV SOLN
INTRAVENOUS | Status: DC | PRN
  Administered 2019-01-02: 09:00:00 via INTRAVENOUS

## 2019-01-02 MED ORDER — BUPIVACAINE LIPOSOME 1.3 % IJ SUSP: 20.0000 mL | Freq: Once | INTRAMUSCULAR | Status: DC

## 2019-01-02 MED ORDER — PHENYLEPHRINE 40 MCG/ML (10ML) SYRINGE FOR IV PUSH (FOR BLOOD PRESSURE SUPPORT)
PREFILLED_SYRINGE | INTRAVENOUS | Status: DC | PRN
  Administered 2019-01-02 (×3): 80 ug via INTRAVENOUS

## 2019-01-02 MED ORDER — ONDANSETRON HCL 4 MG/2ML IJ SOLN: 4.0000 mg | Freq: Once | INTRAMUSCULAR | Status: DC | PRN

## 2019-01-02 MED ORDER — LIDOCAINE 2% (20 MG/ML) 5 ML SYRINGE
INTRAMUSCULAR | Status: DC | PRN
  Administered 2019-01-02: 60 mg via INTRAVENOUS

## 2019-01-02 MED ORDER — BUPIVACAINE-EPINEPHRINE 0.25% -1:200000 IJ SOLN
INTRAMUSCULAR | Status: DC | PRN
  Administered 2019-01-02: 30 mL

## 2019-01-02 MED ORDER — POLYETHYLENE GLYCOL 3350 17 G PO PACK
17.0000 g | PACK | Freq: Two times a day (BID) | ORAL | Status: DC
  Administered 2019-01-02 – 2019-01-09 (×8): 17 g via ORAL
  Filled 2019-01-02 (×14): qty 1

## 2019-01-02 MED ORDER — PIPERACILLIN-TAZOBACTAM IN DEX 2-0.25 GM/50ML IV SOLN
2.2500 g | Freq: Three times a day (TID) | INTRAVENOUS | Status: AC
  Administered 2019-01-02 – 2019-01-07 (×15): 2.25 g via INTRAVENOUS
  Filled 2019-01-02 (×17): qty 50

## 2019-01-02 MED ORDER — MAGNESIUM HYDROXIDE 400 MG/5ML PO SUSP: 30.0000 mL | Freq: Two times a day (BID) | ORAL | Status: DC | PRN

## 2019-01-02 MED ORDER — LACTATED RINGERS IR SOLN
Status: DC | PRN
  Administered 2019-01-02: 2000 mL
  Administered 2019-01-02: 1000 mL

## 2019-01-02 MED ORDER — CEFAZOLIN SODIUM-DEXTROSE 2-3 GM-%(50ML) IV SOLR
INTRAVENOUS | Status: DC | PRN
  Administered 2019-01-02: 2 g via INTRAVENOUS

## 2019-01-02 MED ORDER — SODIUM CHLORIDE 0.9% FLUSH: 3.0000 mL | INTRAVENOUS | Status: DC | PRN

## 2019-01-02 MED ORDER — SUGAMMADEX SODIUM 200 MG/2ML IV SOLN
INTRAVENOUS | Status: AC
  Filled 2019-01-02: qty 2

## 2019-01-02 MED ORDER — PROPOFOL 10 MG/ML IV BOLUS
INTRAVENOUS | Status: DC | PRN
  Administered 2019-01-02: 50 mg via INTRAVENOUS
  Administered 2019-01-02: 30 mg via INTRAVENOUS
  Administered 2019-01-02: 50 mg via INTRAVENOUS

## 2019-01-02 MED ORDER — BUPIVACAINE-EPINEPHRINE (PF) 0.25% -1:200000 IJ SOLN
INTRAMUSCULAR | Status: AC
  Filled 2019-01-02: qty 30

## 2019-01-02 MED ORDER — ESMOLOL HCL 100 MG/10ML IV SOLN
INTRAVENOUS | Status: AC
  Filled 2019-01-02: qty 10

## 2019-01-02 MED ORDER — FENTANYL CITRATE (PF) 100 MCG/2ML IJ SOLN: 25.0000 ug | INTRAMUSCULAR | Status: DC | PRN

## 2019-01-02 MED ORDER — LABETALOL HCL 5 MG/ML IV SOLN
INTRAVENOUS | Status: AC
  Filled 2019-01-02: qty 4

## 2019-01-02 MED ORDER — LACTATED RINGERS IV BOLUS: 1000.0000 mL | Freq: Three times a day (TID) | INTRAVENOUS | Status: DC | PRN

## 2019-01-02 MED ORDER — EPHEDRINE SULFATE-NACL 50-0.9 MG/10ML-% IV SOSY
PREFILLED_SYRINGE | INTRAVENOUS | Status: DC | PRN
  Administered 2019-01-02: 5 mg via INTRAVENOUS

## 2019-01-02 MED ORDER — ESMOLOL HCL 100 MG/10ML IV SOLN
INTRAVENOUS | Status: DC | PRN
  Administered 2019-01-02 (×2): 20 mg via INTRAVENOUS

## 2019-01-02 SURGICAL SUPPLY — 42 items
APPLIER CLIP 5 13 M/L LIGAMAX5 (MISCELLANEOUS)
APPLIER CLIP ROT 10 11.4 M/L (STAPLE)
APR CLP MED LRG 11.4X10 (STAPLE)
APR CLP MED LRG 5 ANG JAW (MISCELLANEOUS)
BAG SPEC RTRVL 10 TROC 200 (ENDOMECHANICALS) ×1
CABLE HIGH FREQUENCY MONO STRZ (ELECTRODE) ×1 IMPLANT
CLIP APPLIE 5 13 M/L LIGAMAX5 (MISCELLANEOUS) IMPLANT
CLIP APPLIE ROT 10 11.4 M/L (STAPLE) IMPLANT
COVER MAYO STAND STRL (DRAPES) ×2 IMPLANT
COVER SURGICAL LIGHT HANDLE (MISCELLANEOUS) ×2 IMPLANT
COVER WAND RF STERILE (DRAPES) ×2 IMPLANT
DECANTER SPIKE VIAL GLASS SM (MISCELLANEOUS) ×2 IMPLANT
DRAIN CHANNEL 19F RND (DRAIN) ×1 IMPLANT
DRAPE C-ARM 42X120 X-RAY (DRAPES) ×2 IMPLANT
DRAPE UTILITY XL STRL (DRAPES) ×2 IMPLANT
DRAPE WARM FLUID 44X44 (DRAPE) ×2 IMPLANT
DRSG TEGADERM 2-3/8X2-3/4 SM (GAUZE/BANDAGES/DRESSINGS) ×8 IMPLANT
ELECT REM PT RETURN 15FT ADLT (MISCELLANEOUS) ×2 IMPLANT
ENDOLOOP SUT PDS II  0 18 (SUTURE) ×2
ENDOLOOP SUT PDS II 0 18 (SUTURE) IMPLANT
EVACUATOR SILICONE 100CC (DRAIN) ×1 IMPLANT
GLOVE ECLIPSE 8.0 STRL XLNG CF (GLOVE) ×2 IMPLANT
GLOVE INDICATOR 8.0 STRL GRN (GLOVE) ×2 IMPLANT
GOWN STRL REUS W/TWL XL LVL3 (GOWN DISPOSABLE) ×4 IMPLANT
IRRIG SUCT STRYKERFLOW 2 WTIP (MISCELLANEOUS) ×2
IRRIGATION SUCT STRKRFLW 2 WTP (MISCELLANEOUS) ×1 IMPLANT
KIT BASIN OR (CUSTOM PROCEDURE TRAY) ×2 IMPLANT
POUCH RETRIEVAL ECOSAC 10 (ENDOMECHANICALS) ×1 IMPLANT
POUCH RETRIEVAL ECOSAC 10MM (ENDOMECHANICALS) ×1
SCISSORS LAP 5X35 DISP (ENDOMECHANICALS) ×2 IMPLANT
SET CHOLANGIOGRAPH MIX (MISCELLANEOUS) ×2 IMPLANT
SLEEVE XCEL OPT CAN 5 100 (ENDOMECHANICALS) ×2 IMPLANT
SUT MNCRL AB 4-0 PS2 18 (SUTURE) ×2 IMPLANT
SUT PDS AB 1 CT1 27 (SUTURE) ×1 IMPLANT
SUT PROLENE 2 0 CT 1 (SUTURE) ×1 IMPLANT
SYR 20CC LL (SYRINGE) ×2 IMPLANT
TOWEL OR 17X26 10 PK STRL BLUE (TOWEL DISPOSABLE) ×2 IMPLANT
TOWEL OR NON WOVEN STRL DISP B (DISPOSABLE) ×2 IMPLANT
TRAY LAPAROSCOPIC (CUSTOM PROCEDURE TRAY) ×2 IMPLANT
TROCAR BLADELESS OPT 5 100 (ENDOMECHANICALS) ×2 IMPLANT
TROCAR XCEL NON-BLD 11X100MML (ENDOMECHANICALS) ×2 IMPLANT
TUBING INSUF HEATED (TUBING) ×2 IMPLANT

## 2019-01-02 NOTE — Op Note (Signed)
01/02/2019  PATIENT:  Nicholas Porter  85 y.o. male  Patient Care Team: Dorothyann Peng, NP as PCP - General (Family Medicine) Leonie Man, MD as PCP - Cardiology (Cardiology) Edrick Oh, MD as Consulting Physician (Nephrology) Clarene Essex, MD as Consulting Physician (Gastroenterology)  PRE-OPERATIVE DIAGNOSIS:    Acute on Chronic Calculus Cholecystitis  History of vagatomy/antrectomy  POST-OPERATIVE DIAGNOSIS:   Acute Gangrenous Cholecystitis History of vagatomy/antrectomy  PROCEDURE:  Laparoscopic cholecystectomy  Diagnostic laparoscopy Laparoscopic lysis adhesions x45 minutes (1/2 of case)  SURGEON:  Adin Hector, MD, FACS.  ASSISTANT: OR Staff   ANESTHESIA:    General with endotracheal intubation Local anesthetic as a field block  EBL:  (See Anesthesia Intraoperative Record) Total I/O In: 900 [I.V.:900] Out: 36 [Blood:50]   Delay start of Pharmacological VTE agent (>24hrs) due to surgical blood loss or risk of bleeding:  no  DRAINS: 19 Fr Blake closed bulb drain in the RUQ   SPECIMEN: Gallbladder    DISPOSITION OF SPECIMEN:  PATHOLOGY  COUNTS:  YES  PLAN OF CARE: Admit to inpatient   PATIENT DISPOSITION:  PACU - hemodynamically stable.  INDICATION: Elderly thin male with history of fair oral intake and unintentional weight loss of 40 pounds over 6 years.  Developed intense epigastric pain 24 hours prior to admission.  Became more focal in her upper quadrant.  CT scan and ultrasound suspicious for cholecystitis.  He does have a history of vagotomy and antrectomy when he was much much younger for ulcer.  No new issues since.  I recommended laparoscopic exploration with cholecystectomy possible other interventions  The anatomy & physiology of hepatobiliary & pancreatic function was discussed.  The pathophysiology of gallbladder dysfunction was discussed.  Natural history risks without surgery was discussed.   I feel the risks of no intervention will lead  to serious problems that outweigh the operative risks; therefore, I recommended cholecystectomy to remove the pathology.  I explained laparoscopic techniques with possible need for an open approach.  Probable cholangiogram to evaluate the bilary tract was explained as well.    Risks such as bleeding, infection, abscess, leak, injury to other organs, need for further treatment, heart attack, death, and other risks were discussed.  I noted a good likelihood this will help address the problem.  Possibility that this will not correct all abdominal symptoms was explained.  Goals of post-operative recovery were discussed as well.  We will work to minimize complications.  An educational handout further explaining the pathology and treatment options was given as well.  Questions were answered.  The patient expresses understanding & wishes to proceed with surgery.  OR FINDINGS: Very enlarged gallbladder creating a firm mass on the abdominal wall.  Turgid.  Evidence of gangrene at the infundibulum.  Liver: normal.  Upper abdominal adhesions primary of liver stomach and omentum.  No gastroduodenal perforation.  No evidence of any recurrent ulcer.  No evidence of any carcinomatosis or metastatic disease.  Small intestine and colon uninflamed and otherwise normal.  DESCRIPTION:   Informed consent was confirmed.  The patient underwent general anaesthesia without difficulty.  With his abdomen soft, he had an obvious right upper quadrant ellipsoid mass through his thin abdominal wall very suspicious for acute cholecystitis the patient was positioned appropriately.  VTE prevention in place.  The patient's abdomen was clipped, prepped, & draped in a sterile fashion.  Surgical timeout confirmed our plan.  Peritoneal entry with a laparoscopic port was obtained using Varess spring needle entry technique in  the left upper abdomen subcostal ridge on the anterior axillary line as the patient was positioned in reverse  Trendelenburg.  I induced carbon dioxide insufflation.  No change in end tidal CO2 measurements.  Full symmetrical abdominal distention.  Initial port was carefully placed.  Camera inspection revealed no injury.  He had a large very distended gallbladder with the tip coming down to the umbilicus.  Extra ports were carefully placed under direct laparoscopic visualization.  I placed him more in the lower abdomen since he had a large low gallbladder and he was a very thin short wasted male  I turned attention to the right upper quadrant.  I could see the infundibulum but it was very distended.  I did needle aspiration to better decompress the gallbladder.  The gallbladder fundus was elevated cephalad.   I carefully did laparoscopic lyse adhesions to free the greater omentum mesocolon and duodenal sweep off the gallbladder.  Freed off some adhesions of liver and omentum to the anterior abdominal wall as well to help better define anatomy.  This took some time.  With that it could better elevate and confirm I had a very large gallbladder.  Elevated cephalad to expose the ventral surface.  I used hook cautery to free the peritoneal coverings between the gallbladder and the liver on the posteriolateral and anteriomedial walls.   I used careful blunt and hook dissection to help get a good critical view of the cystic artery and cystic duct.   Gallbladder was quite inflamed and oozy.  I did further dissection to free all of the gallbladder off the liver bed to get a good critical view of the infundibulum and cystic duct.  I dissected out the cystic artery; and, after getting a good 360 view, ligated the anterior & posterior branches of the cystic artery close on the infundibulum using clips and cautery.  Made an effort to ensure hemostasis on the gallbladder fossa of the liver as well.  As I elevated the gallbladder up and better expose the infundibulum there was pockets of gangrene in the region.  Also on the  retroperitoneum as well.  Could not prove any definite ulceration along the duodenal sweep though.  I skeletonized the cystic duct.  It was rather short thickened and inflamed.  Because had an excellent critical view with only the cystic duct remaining after complete modified classic in dome down dissection to get the gallbladder completely off the liver, I held off on any cholangiogram.  I ligated the cystic duct with 0 PDS Endoloop x2 as well as some clips to mark the region and transected the gallbladder off the cystic duct at the infundibulum.  Placed the gallbladder inside and eco-sac.    I ensured hemostasis on the gallbladder fossa of the liver and elsewhere. I inspected the rest of the abdomen & detected no injury nor bleeding elsewhere.  Did copious irrigation of several liters.  Liver bed cleaned up.  Confirmed cystic duct and cystic arterial branch stumps ligated without bleeding or bile.  I focused on diagnostic laparoscopy.  I found the ileocecal valve and ran the small intestine more proximally towards the ligament of Treitz.  There were no adhesions or no obvious masses or abnormalities.  Rectosigmoid colon constipated with stool and some diverticulosis but soft and no diverticulitis.  Rest of the colon not inflamed.  Transverse colon floppy and somewhat mobile.  Some hepatic flexure adhesions such that the ascending colon and proximal transverse colon were adherent to each other  but no significant mobility or risk of observation so I left them alone.  Again dense adhesions of liver omentum to the anterior abdominal wall.  I freed some lows off.  I cannot see the stomach remnant very well underneath all that.  I did open up a pocket of gangrene on the retroperitoneum just lateral to the porta hepatis.  Deeper retroperitoneum seem more viable.  It was lateral to the duodenal sweep.  Argued against any abscess or perforation  I asked anesthesia to insufflate oxygen inside the orogastric tube as  I clamped off the ligament of Treitz.  This insufflated the stomach and especially the duodenal bulb.  I had irrigation in that region.  There was no bubbling or air leak.  That argued against any primary gastroduodenal perforation causing secondary cholecystitis.  Reassuring.  However, because of the very large inflamed gallbladder with some gangrene including on the retroperitoneum, I did decide to leave a drain in place.  Tip underneath the falciform ligament sweeping behind the right hepatic lobe in Morison's pouch, down along the right gutter and pelvis and out a left lower quadrant port site.  Secured the skin with Prolene suture.  I removed the gallbladder inside the eco-sac through the left upper quadrant port site after opening up 3 cm to get it out.  I close this fascial defect transversely using #1 PDS interrupted stitches. I closed the skin using 4-0 monocryl stitch.  Sterile dressings were applied. The patient was extubated & arrived in the PACU in stable condition..  I discussed operative findings, updated the patient's status, discussed probable steps to recovery, and gave postoperative recommendations to the patient's family.  Recommendations were made.  Questions were answered.  They expressed understanding & appreciation.  I suspect he would benefit from antibiotics for 72 hours.  Drain that long as well.  If no obvious leak by the time of discharge, drain most likely can be removed.  If it is still struggle with pain and discomfort, consider gastroenterology reevaluation with upper and lower endoscopy to rule out other issues.  Adin Hector, M.D., F.A.C.S. Gastrointestinal and Minimally Invasive Surgery [facility] [address], [place], [place] [address] [phone] Main / Paging  01/02/2019 11:22 AM

## 2019-01-02 NOTE — Progress Notes (Signed)
Pharmacy Antibiotic Note  Nicholas Porter is a 85 y.o. male with a h/o CKD IV admitted on 01/01/2019 with acute cholecystitis s/p lap chole 1/11.  Pharmacy has been consulted for Zosyn dosing x 5 days postoperatively.  Plan: Zosyn 2.25 g 30-min infusion q 8 hours x 15 doses.  Height: 5\' 7"  (170.2 cm) Weight: 95 lb 10.9 oz (43.4 kg) IBW/kg (Calculated) : 66.1  Temp (24hrs), Avg:98.7 F (37.1 C), Min:97.6 F (36.4 C), Max:100.1 F (37.8 C)  Recent Labs  Lab 01/01/19 1150 01/01/19 2140 01/02/19 0458  WBC 11.2*  --  19.8*  CREATININE 1.75* 1.96*  --     Estimated Creatinine Clearance: 16.6 mL/min (A) (by C-G formula based on SCr of 1.96 mg/dL (H)).    Allergies  Allergen Reactions  . Amlodipine Besylate Hives  . Hydralazine Itching  . Lipitor [Atorvastatin] Itching  . Naproxen Diarrhea  . Oxycodone-Acetaminophen Itching  . Isosorbide Rash    Thank you for allowing pharmacy to be a part of this patient's care.  Ulice Dash D 01/02/2019 12:24 PM

## 2019-01-02 NOTE — Transfer of Care (Signed)
Immediate Anesthesia Transfer of Care Note  Patient: Nicholas Porter  Procedure(s) Performed: LAPAROSCOPIC CHOLECYSTECTOMY (N/A Abdomen)  Patient Location: PACU  Anesthesia Type:General  Level of Consciousness: drowsy  Airway & Oxygen Therapy: Patient Spontanous Breathing and Patient connected to face mask  Post-op Assessment: Report given to RN and Post -op Vital signs reviewed and stable  Post vital signs: Reviewed and stable  Last Vitals:  Vitals Value Taken Time  BP 196/73 01/02/2019 11:21 AM  Temp    Pulse 85 01/02/2019 11:22 AM  Resp 15 01/02/2019 11:22 AM  SpO2 100 % 01/02/2019 11:22 AM  Vitals shown include unvalidated device data.  Last Pain:  Vitals:   01/02/19 0800  TempSrc:   PainSc: 0-No pain         Complications: No apparent anesthesia complications

## 2019-01-02 NOTE — Progress Notes (Signed)
Patient continues to sleep after surgery.  Patient awakens briefly when touched but dozes back off.  Unable to stay awake to take anything PO at this time.  Appears to be comfortable using PAINAD.  Will continue to monitor.  Family at bedside

## 2019-01-02 NOTE — Significant Event (Signed)
Rapid Response Event Note  Overview: Time Called: 8921 Arrival Time: 0430 Event Type: Neurologic   Called to bedside by pt nurse due pt is to be unresponsive.    Initial Focused Assessment:  Upon entering pt room, pt doesn't respond to his name being called but he does raise his eyebrows to physical stimulation.  Still no verbal response.  However, pt screams and kicks to nailbed pressure.  States" I am hurting so bad I want to be left alone."  Able to follow commands, no SOB noted on 2 L Broussard.  Interventions: Pt placed on monitor (SR). See flow sheet for VS.  Pt oriented x1 but RN says this is not a change.  Labs drawn per protocol.  EKG complete and CBG done.  No ABG per TRIAD, NP now at the bedside for further evaluation.  Troponin ordered per TRIAD, NP.  Placed foley due pt bladder extended and c/o belly pain (Bladder scan showed greater than 550).  Pt to be transferred to Telemetry for closer monitoring.      Dyann Ruddle

## 2019-01-02 NOTE — Progress Notes (Signed)
Patient more alert this evening but confused and speaks as if he were at home.  Appears that patient thinks I am his wife.  Will not follow commands to open eyes while he is talking.  Attempted to feed patient, but would only take 2 sips of juice and refuses food.  Patient gets irritated with interaction and states "mama just leave me alone, I haven't slept in 2 nights"  And "you know I can't drive in the dark"  Attempted to get patient OOB but refuses and pulls away and swats.  When left alone falls back asleep.  Unable to ambulate or advance diet at this time or give any PO meds.  No pain meds have been given since patient returned from surgery due to somnolence.  Hospitalist made aware.  VS remain stable.  JP draining large amount of thin bloody fluid - was told to expect this during report from OR staff due to irrigation done in surgery.  Will continue to monitor.

## 2019-01-02 NOTE — Progress Notes (Signed)
Around 1820 heard patient yelling "Mama" over and over out loud.  Patient woke up and thought he was at home.  Patient holding eyes open and now easily reoriented.  Knows that it is year 2020.  Denies pain at this time.  Now agreeable to drinking some fluids and taking medications.  Tylenol, hydralazine, and pravastatin administered without difficulty.  Patient also ate applesauce without problem - requested grilled cheese and ice cream for dinner.  Waiting on tray to arrive at this time. Will continue to monitor.

## 2019-01-02 NOTE — Anesthesia Preprocedure Evaluation (Addendum)
Anesthesia Evaluation  Patient identified by MRN, date of birth, ID bandGeneral Assessment Comment:Patient responds to commands  Reviewed: Allergy & Precautions, Patient's Chart, lab work & pertinent test results  Airway Mallampati: II  TM Distance: >3 FB Neck ROM: Full    Dental  (+) Edentulous Upper, Edentulous Lower   Pulmonary COPD, former smoker,    Pulmonary exam normal breath sounds clear to auscultation       Cardiovascular hypertension, + CAD, + Past MI and + Cardiac Stents (x 1)  Normal cardiovascular exam+ Valvular Problems/Murmurs MR  Rhythm:Regular Rate:Normal  ECG: SR, rate 78  ECHO: Mild global reduction in LV systolic function; grade 1 diastolic dysfunction; elevated LV filling pressure; mild AS and AI; mild to moderate MR; mild TR; moderately elevated pulmonary pressure.  Myocardial perfusion The left ventricular ejection fraction is mildly decreased (45-54%). Nuclear stress EF: 51%. No T wave inversion was noted during stress. There was no ST segment deviation noted during stress. This is a low risk study.  Sees cardiologist   Neuro/Psych negative neurological ROS  negative psych ROS   GI/Hepatic negative GI ROS, Neg liver ROS,   Endo/Other  negative endocrine ROS  Renal/GU Renal disease     Musculoskeletal negative musculoskeletal ROS (+)   Abdominal   Peds  Hematology  (+) anemia , Thrombocytopenia   Anesthesia Other Findings Cholelithiasis  Reproductive/Obstetrics                           Anesthesia Physical Anesthesia Plan  ASA: III  Anesthesia Plan: General   Post-op Pain Management:    Induction: Intravenous  PONV Risk Score and Plan: 3 and Ondansetron, Dexamethasone and Treatment may vary due to age or medical condition  Airway Management Planned: Oral ETT  Additional Equipment:   Intra-op Plan:   Post-operative Plan: Extubation in OR  Informed  Consent: I have reviewed the patients History and Physical, chart, labs and discussed the procedure including the risks, benefits and alternatives for the proposed anesthesia with the patient or authorized representative who has indicated his/her understanding and acceptance.     Plan Discussed with: CRNA  Anesthesia Plan Comments: (Spoke with Hospitalist regarding acute early morning event. OK to proceed with surgery. Spoke with patient and family regarding suspension of DNR while in the OR, as well as potential need for additional IV access, central line, and arterial line.)       Anesthesia Quick Evaluation

## 2019-01-02 NOTE — Progress Notes (Signed)
Around 4:30 am ,pt wanted to go to bathroom. Took pt to bathroom. Pt could not voided. Helped pt  back to bed. Bladder scanned showed 584 ml urine retention. Pt's IV also got infiltrate. Removed old IV and inserted new IV on his Rt arm. Pt suddenly went unresponsive . Did not response with voice. Checked V/S B/p-136/65,HR-73,R-26, O2 sats 100% . Called Rapid response and also paged on call NP. Stat EKG done. Sinus rhythm with premature atrial Complex,ST & T wave abnormality,consider inferior ischemia. NP came at bedside to evaluate pt. Received order to insert F/C from NP. Placed 76F foley catheter. Pt tolerated procedure well. 618ml clear yellow urine drained .  Pt scheduled  for surgery this morning . Called pt's wife  To get consent . No answered. CHG bath done. Also received order to transfer pt to telemetry floor. Will continue to monitor.

## 2019-01-02 NOTE — Progress Notes (Signed)
Patient Demographics:    Nicholas Porter, is a 85 y.o. male, DOB - 07-24-1930, ZOX:[id number]  Admit date - 01/01/2019   Admitting Physician Jasdeep Kepner Denton Brick, MD  Outpatient Primary MD for the patient is Dorothyann Peng, NP  LOS - 1   Chief Complaint  Patient presents with  . Abdominal Pain  . Nausea        Subjective:    Nicholas Porter today has no fevers, no emesis,  No chest pain, overnight patient had episode of near syncope, no chest pains no palpitations no dizziness complete continues to complain of abdominal pain, WBC trending up,---  Assessment  & Plan :    Principal Problem:   Acute gangrenous cholecystitis s/p lap cholecystectomy 01/02/2019 Active Problems:   Essential hypertension   CAD S/P percutaneous coronary angioplasty - DES in RCA with PTCA for ISR; Moderate mLAD, 80% ostial OM2 stable   H/O Inferior STEMI (09/2013) emergent PCI with Promus DES to RCA (3.0 mm  x 28 mm - 3.2 mm)   GERD (gastroesophageal reflux disease)   Malnutrition of moderate degree   Benign prostatic hyperplasia   CKD (chronic kidney disease) stage 4, GFR 15-29 ml/min (HCC)   Unintentional weight loss   History of medication noncompliance   History of partial gastrectomy   Pill dysphagia  Brief summary 85 y.o. male has medical history relevant for CAD with prior angioplasty and stenting, PAD with prior left carotid endodiathermy, COPD/ patient is a reformed smoker who quit smoking about 50 years ago as well as h/o partial gastrectomy/vagatomy in distant past for ulcer. LAst EGD 2009 with mild gastritis ,CKDIII , unintentional weight loss 60 lbs last year admitted on 01/01/2019 with abdominal pain and found to have calculus cholecystitis, status post lap chole and lysis of adhesions on 01/02/2019 by Dr. Johney Maine   Plan:- 1)Acute calculus cholecystitis-----given worsening leukocytosis patient underwent lap chole with  lysis of adhesions on 01/02/2019 , surgical consult from Dr. Johney Maine appreciated,, - surgical team recommends IV Zosyn x5 days starting 01/02/2019 (patient received IV Rocephin and Flagyl on 01/01/2019)----further management per surgical team, PRN pain meds PRN antiemetics  2)HTN--stage II, much improved with better control of abdominal pain,  continue. Imdur 30 mg daily along with hydralazine 25 mg 3 times daily and metoprolol 25 mg twice daily,   may use IV labetalol when necessary  Every 4 hours for systolic blood pressure over 160 mmhg  3)Social/Ethics--- Patient and his wife both admit that he is a DNR, RN Alex verified DNR status  4)HFrEF--- patient with history of combined systolic and diastolic dysfunction CHF, last known EF 45 to 50% in the setting of known CAD and possible ischemic cardiomyopathy---   be judicious with IV fluids to avoid volume overload, continue metoprolol, isosorbide/hydralazine combo, given near syncopal episode in a.m. of 01/02/2019 will repeat echo, QTC on EKG is normalized  5)H/o CAD and PAD-- CAD with prior angioplasty and stenting, PAD with prior left carotid endarterecmy , patient apparently is intolerant to Lipitor trial low-dose pravastatin, restart aspirin ----continue metoprolol, no ACS type symptoms at this time, EKG without ischemic changes, repeat echo pending due to her near syncopal episode on 01/02/2019  6)CKDIII--renal function appears stable at this time, with a creatinine of 1.75,  creatinine in May 2019 was actually 1.9,   , renally adjust medications, avoid nephrotoxic agents/dehydration/hypotension----repeat BMP pending  7) anorexia and weight loss----consider GI consult for endoluminal evaluation, continue Remeron for appetite stimulation and sleep, nutritional consult pending  8) COPD/emphysema----quit smoking about 50 years ago, may use PRN albuterol, no acute flareup of COPD at this time  Disposition/Need for in-Hospital Stay- patient unable to be  discharged at this time due to status post lap chole lysis evaluation 01/02/2019, patient needs IV antibiotics given significant gangrenous cholecystitis with white count of almost 20 K, also needs further investigations for near syncopal episode  Code Status : DNR  Family Communication:   wife   Disposition Plan  : TBD  Consults  :  Gen surg  DVT Prophylaxis  :    - Heparin -   Lab Results  Component Value Date   PLT 89 (L) 01/02/2019    Inpatient Medications  Scheduled Meds: . acetaminophen  1,000 mg Oral On Call to OR  . acetaminophen  1,000 mg Oral TID  . aspirin EC  81 mg Oral Daily  . brinzolamide  1 drop Left Eye Q12H  . bupivacaine liposome  20 mL Infiltration Once  . calcium-vitamin D  1 tablet Oral QODAY  . Chlorhexidine Gluconate Cloth  6 each Topical Once  . feeding supplement  237 mL Oral BID BM  . gabapentin  300 mg Oral On Call to OR  . gabapentin  300 mg Oral QHS  . heparin  5,000 Units Subcutaneous Q8H  . hydrALAZINE  25 mg Oral TID  . isosorbide mononitrate  30 mg Oral Daily  . labetalol      . latanoprost  1 drop Both Eyes QHS  . lip balm  1 application Topical BID  . metoprolol tartrate  25 mg Oral BID  . mirtazapine  7.5 mg Oral QHS  . polyethylene glycol  17 g Oral BID  . pravastatin  20 mg Oral q1800  . sodium chloride flush  3 mL Intravenous Q12H  . sodium chloride flush  3 mL Intravenous Q12H   Continuous Infusions: . sodium chloride    . sodium chloride    . dextrose 5 % and 0.45% NaCl 75 mL/hr at 01/02/19 1318  . lactated ringers    . methocarbamol (ROBAXIN) IV    . ondansetron (ZOFRAN) IV    . piperacillin-tazobactam     PRN Meds:.sodium chloride, sodium chloride, acetaminophen, acetaminophen, albuterol, alum & mag hydroxide-simeth, bisacodyl, fentaNYL (SUBLIMAZE) injection, guaiFENesin-dextromethorphan, hydrocortisone, hydrocortisone cream, labetalol, lactated ringers, magic mouthwash, magnesium hydroxide, menthol-cetylpyridinium,  methocarbamol (ROBAXIN) IV, ondansetron (ZOFRAN) IV **OR** ondansetron (ZOFRAN) IV, phenol, polyethylene glycol, prochlorperazine, sodium chloride flush, sodium chloride flush, traMADol, traZODone    Anti-infectives (From admission, onward)   Start     Dose/Rate Route Frequency Ordered Stop   01/02/19 1400  piperacillin-tazobactam (ZOSYN) IVPB 2.25 g  Status:  Discontinued     2.25 g 100 mL/hr over 30 Minutes Intravenous Every 8 hours 01/02/19 1223 01/02/19 1224   01/02/19 1400  piperacillin-tazobactam (ZOSYN) IVPB 2.25 g     2.25 g 100 mL/hr over 30 Minutes Intravenous Every 8 hours 01/02/19 1224 01/07/19 1359   01/02/19 0909  ceFAZolin (ANCEF) 2-4 GM/100ML-% IVPB  Status:  Discontinued    Note to Pharmacy:  Guerry Bruin   : cabinet override      01/02/19 0909 01/02/19 0916   01/02/19 0600  ceFAZolin (ANCEF) IVPB 2g/100 mL premix  Status:  Discontinued  2 g 200 mL/hr over 30 Minutes Intravenous On call to O.R. 01/01/19 1909 01/02/19 1218   01/02/19 0600  metroNIDAZOLE (FLAGYL) IVPB 500 mg     500 mg 100 mL/hr over 60 Minutes Intravenous On call to O.R. 01/01/19 1909 01/02/19 0836   01/01/19 1730  cefTRIAXone (ROCEPHIN) 2 g in sodium chloride 0.9 % 100 mL IVPB  Status:  Discontinued     2 g 200 mL/hr over 30 Minutes Intravenous  Once 01/01/19 1728 01/02/19 1218        Objective:   Vitals:   01/02/19 1200 01/02/19 1215 01/02/19 1230 01/02/19 1257  BP: (!) 175/77 (!) 179/73 (!) 146/69 129/75  Pulse: 84 71 70 70  Resp: (!) 22 (!) 22 18 18   Temp:   98 F (36.7 C) 97.9 F (36.6 C)  TempSrc:    Oral  SpO2: 100% 100% 100% 100%  Weight:      Height:        Wt Readings from Last 3 Encounters:  01/01/19 43.4 kg  10/21/18 45.8 kg  10/08/18 45.8 kg     Intake/Output Summary (Last 24 hours) at 01/02/2019 1401 Last data filed at 01/02/2019 1300 Gross per 24 hour  Intake 1353.58 ml  Output 935 ml  Net 418.58 ml     Physical Exam Patient is examined daily including  today on 01/02/2019 , exams remain the same as of yesterday except that has changed   Gen:- Awake Alert, uncomfortable due to abdominal pain HEENT:- [place].AT, No sclera icterus Neck-Supple Neck,No JVD,.  Lungs-diminished in bases, no significant adventitious sounds  CV- S1, S2 normal, regular , 3/6 systolic murmur Abd-  +ve B.Sounds, Abd Soft, appropriate postop tenderness,  Extremity/Skin:- No  edema, pedal pulses present = Psych-affect is appropriate, oriented x3 Neuro-no new focal deficits, no tremors   Data Review:   Micro Results Recent Results (from the past 240 hour(s))  Blood culture (routine x 2)     Status: None (Preliminary result)   Collection Time: 01/01/19  7:58 PM  Result Value Ref Range Status   Specimen Description   Final    BLOOD LEFT ANTECUBITAL Performed at [facility] [address], [place], [place] [address]    Special Requests   Final    BOTTLES DRAWN AEROBIC AND ANAEROBIC Blood Culture adequate volume Performed at [facility] [address], [place], [place] [address]    Culture   Final    NO GROWTH < 12 HOURS Performed at [facility] [address], [place], [place] [address]    Report Status PENDING  Incomplete  Blood culture (routine x 2)     Status: None (Preliminary result)   Collection Time: 01/01/19  7:58 PM  Result Value Ref Range Status   Specimen Description   Final    BLOOD BLOOD LEFT FOREARM Performed at [facility] [address] [place], [place] [address]    Special Requests   Final    BOTTLES DRAWN AEROBIC AND ANAEROBIC Blood Culture adequate volume Performed at [facility] [address] [place], [place] [address]    Culture   Final    NO GROWTH < 12 HOURS Performed at [facility] [address], [place], [place] [address]    Report Status PENDING  Incomplete  Surgical pcr screen     Status: None   Collection Time: 01/02/19  5:57  AM  Result Value Ref Range Status  MRSA, PCR NEGATIVE NEGATIVE Final   Staphylococcus aureus NEGATIVE NEGATIVE Final    Comment: (NOTE) The Xpert SA Assay (FDA approved for NASAL specimens in patients 60 years of age and older), is one component of a comprehensive surveillance program. It is not intended to diagnose infection nor to guide or monitor treatment. Performed at [facility], [place] [address], [place], [place] [address]     Radiology Reports Ct Abdomen Pelvis Wo Contrast  Result Date: 01/01/2019 CLINICAL DATA:  85 year old male with history of left lower quadrant abdominal pain radiating to the back. EXAM: CT ABDOMEN AND PELVIS WITHOUT CONTRAST TECHNIQUE: Multidetector CT imaging of the abdomen and pelvis was performed following the standard protocol without IV contrast. COMPARISON:  CT the abdomen and pelvis 05/07/2018. FINDINGS: Lower chest: Aortic atherosclerosis. Calcified atherosclerotic plaque in the right coronary artery. Calcifications of the mitral annulus. Surgical clips adjacent to the gastroesophageal junction. Hepatobiliary: No definite cystic or solid hepatic lesions are confidently identified on today's noncontrast CT examination. Small calcified gallstones in the gallbladder. No findings to suggest an acute cholecystitis at this time. Pancreas: No definite pancreatic mass or peripancreatic fluid or inflammatory changes are noted on today's noncontrast CT examination. Spleen: Unremarkable. Adrenals/Urinary Tract: There are no abnormal calcifications within the collecting system of either kidney, along the course of either ureter, or within the lumen of the urinary bladder. No hydroureteronephrosis or perinephric stranding to suggest urinary tract obstruction at this time. The unenhanced appearance of the kidneys is unremarkable bilaterally. Unenhanced appearance of the urinary bladder is normal. Bilateral adrenal glands are normal in appearance.  Stomach/Bowel: Unenhanced appearance of the stomach is normal. No pathologic dilatation of small bowel or colon. Numerous colonic diverticulae are noted, without surrounding inflammatory changes to suggest an acute diverticulitis at this time. The appendix is not confidently identified and may be surgically absent. Regardless, there are no inflammatory changes noted adjacent to the cecum to suggest the presence of an acute appendicitis at this time. Vascular/Lymphatic: Aortic atherosclerosis. No lymphadenopathy confidently identified in the abdomen or pelvis on today's noncontrast CT examination. Reproductive: Prostate gland and seminal vesicles are unremarkable in appearance. Other: No significant volume of ascites.  No pneumoperitoneum. Musculoskeletal: There are no aggressive appearing lytic or blastic lesions noted in the visualized portions of the skeleton. IMPRESSION: 1. No acute findings are noted in the abdomen or pelvis to account for the patient's symptoms. 2. Severe colonic diverticulosis without definite surrounding inflammatory changes to indicate an acute diverticulitis at this time. 3. Cholelithiasis without evidence of acute cholecystitis. 4. Aortic atherosclerosis, in addition to least right coronary artery disease. 5. There are calcifications of the mitral annulus. Echocardiographic correlation for evaluation of potential valvular dysfunction may be warranted if clinically indicated. Electronically Signed   By: Vinnie Langton M.D.   On: 01/01/2019 14:36   Dg Chest 2 View  Result Date: 01/01/2019 CLINICAL DATA:  Medical clearance for cholecystitis EXAM: CHEST - 2 VIEW COMPARISON:  03/27/2018 FINDINGS: Emphysematous hyperinflation of the lungs without acute pulmonary consolidation. Normal heart size and mediastinal contours with aortic atherosclerosis. Vascular clip projects over the base of the neck on the left. 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 IMPRESSION: Emphysematous hyperinflation of the lungs without acute pulmonary disease. Electronically Signed   By: Ashley Royalty M.D.   On: 01/01/2019 18:03   Dg Chest Port 1 View  Result Date: 01/02/2019 CLINICAL DATA:  85 y/o  M; unresponsiveness. EXAM: PORTABLE CHEST 1 VIEW COMPARISON:  01/01/2019 chest radiograph FINDINGS: Normal cardiac silhouette. Aortic atherosclerosis with calcification. Clear lungs. No pleural effusion or pneumothorax. No acute osseous abnormality is evident. Surgical clips project over the gastroesophageal junction. IMPRESSION: No acute pulmonary process identified. Electronically Signed   By: Kristine Garbe M.D.   On: 01/02/2019 06:11   US Abdomen Limited Ruq  Result Date: 01/01/2019 CLINICAL DATA:  Upper abdominal pain EXAM: ULTRASOUND ABDOMEN LIMITED RIGHT UPPER QUADRANT COMPARISON:  CT from earlier in the same day. FINDINGS: Gallbladder: Gallbladder is well distended with wall thickening to 5.7 mm and edema within the gallbladder wall. Gallstones are noted. Positive sonographic Murphy's sign is noted. Common bile duct: Diameter: 10 mm Liver: Liver is within normal limits with the exception of a 1 cm hyperechoic focus within the left lobe consistent with a small hemangioma. Portal vein is patent on color Doppler imaging with normal direction of blood flow towards the liver. IMPRESSION: Changes consistent with acute calculous cholecystitis in the appropriate clinical setting. Hyperechoic focus within the left lobe of the liver most consistent with small hemangioma. Electronically Signed   By: Inez Catalina M.D.   On: 01/01/2019 16:34     CBC Recent Labs  Lab 01/01/19 1150 01/02/19 0458  WBC 11.2* 19.8*  HGB 13.0 12.9*  HCT 38.5* 38.7*  PLT 92* 89*  MCV 97.5 100.5*  MCH 32.9 33.5  MCHC 33.8 33.3  RDW 13.1 13.7  LYMPHSABS 0.4*  --   MONOABS 1.0  --   EOSABS 0.0  --   BASOSABS 0.0  --     Chemistries  Recent Labs   Lab 01/01/19 1150 01/01/19 2140  NA 142  --   K 4.7  --   CL 108  --   CO2 23  --   GLUCOSE 152*  --   BUN 46*  --   CREATININE 1.75* 1.96*  CALCIUM 9.4  --   AST 20  --   ALT 9  --   ALKPHOS 90  --   BILITOT 1.4*  --    ------------------------------------------------------------------------------------------------------------------ No results for input(s): CHOL, HDL, LDLCALC, TRIG, CHOLHDL, LDLDIRECT in the last 72 hours.  Lab Results  Component Value Date   HGBA1C 5.3 10/05/2013   ------------------------------------------------------------------------------------------------------------------ No results for input(s): TSH, T4TOTAL, T3FREE, THYROIDAB in the last 72 hours.  Invalid input(s): FREET3 ------------------------------------------------------------------------------------------------------------------ Recent Labs    01/01/19 1957  VITAMINB12 248  FOLATE 10.9  FERRITIN 339*  TIBC 286  IRON 18*  RETICCTPCT 1.3    Coagulation profile No results for input(s): INR, PROTIME in the last 168 hours.  No results for input(s): DDIMER in the last 72 hours.  Cardiac Enzymes Recent Labs  Lab 01/02/19 0514  TROPONINI 0.05*   ------------------------------------------------------------------------------------------------------------------ No results found for: BNP   Roxan Hockey M.D on 01/02/2019 at 2:01 PM  Go to [email or web address] - for contact info  [facility] - Office  [phone]

## 2019-01-02 NOTE — Progress Notes (Signed)
Anesthesiologist called and asked to start pre op med. This nurse asked if is okay to do so, thought protocol was to send med with patient. He said is okay to give med and that is the reason for his call. That patient will be picked up in 15 mins for procedure. This nurse also made him aware that patient consent  is not sign, waiting for his wife to come and do so. He said that will not delay the procedure. He was also made aware of patient temp.

## 2019-01-02 NOTE — Progress Notes (Signed)
Nicholas Porter  [birth year] [id number]  Patient Care Team: Dorothyann Peng, NP as PCP - General (Family Medicine) Leonie Man, MD as PCP - Cardiology (Cardiology) Edrick Oh, MD as Consulting Physician (Nephrology) Clarene Essex, MD as Consulting Physician (Gastroenterology)  Patient with episode of decreased responsiveness last night most likely due to pain during in and out catheterization.  Quickly improved.  Work-up negative for any significant cardiopulmonary issues.  No EKG changes.  Patient denies any chest pain.  Still with abdominal pain only.  Increased white count.  Low-grade fever.  I think the patient needs more aggressive intervention for his cholecystitis.  Again recommend cholecystectomy  Dr. Roanna Banning with anesthesiology has discussed with the primary internal medicine service and patient is clinically cleared.  To proceed with surgery later this morning.  Patient Active Problem List   Diagnosis Date Noted  . CKD (chronic kidney disease) stage 4, GFR 15-29 ml/min (HCC) 01/01/2019    Priority: Medium  . Acute calculous cholecystitis 01/01/2019  . Unintentional weight loss 01/01/2019  . History of medication noncompliance 01/01/2019  . History of partial gastrectomy 01/01/2019  . Hyperlipidemia with target LDL less than 70 10/23/2018  . Psychiatric disturbance 04/15/2017  . Orthostatic hypotension 04/08/2017  . Benign prostatic hyperplasia 03/11/2017  . Weakness   . Mitral regurgitation - on exam 09/03/2016  . Malnutrition of moderate degree 11/29/2015  . Anemia of chronic renal failure, stage 4 (severe) ([place]) 11/27/2015  . GERD (gastroesophageal reflux disease) 01/24/2015  . Thrombocytopenia ([place]) 08/09/2014  . H/O Inferior STEMI (09/2013) emergent PCI with Promus DES to RCA (3.0 mm  x 28 mm - 3.2 mm) 10/05/2013  . 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 CAD S/P percutaneous coronary angioplasty - DES in RCA with PTCA for ISR; Moderate mLAD, 80% ostial OM2 stable  05/22/2008  . Essential hypertension 06/30/2007  . Left-sided carotid artery disease -- s/p CEA 04/06/2006    Class: History of    Past Medical History:  Diagnosis Date  . Acute metabolic encephalopathy 05/23/4430  . Acute renal failure superimposed on stage 4 chronic kidney disease ([place]) 11/27/2015  . Anemia of chronic renal failure, stage 4 (severe) ([place]) 11/27/2015  . 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 CAD S/P percutaneous coronary angioplasty - DES in RCA with PTCA for ISR; Moderate mLAD, 80% ostial OM2 stable 05/22/2008   Qualifier: Diagnosis of  By: Linda Hedges MD, Heinz Knuckles   . CKD (chronic kidney disease) stage 3, GFR 30-59 ml/min ([place])   . COPD (chronic obstructive pulmonary disease) ([place])   . Essential hypertension 06/30/2007   Qualifier: Diagnosis of  By: Cori Razor RN, Mikal Plane Eye abnormalities    injections in eyes 12/2017  . H/O Inferior STEMI (09/2013) emergent PCI with Promus DES to RCA (3.0 mm  x 28 mm - 3.2 mm) 10/05/2013  . Klebsiella sepsis ([place]) 04/02/2017  . Left-sided carotid artery disease -- s/p CEA 04/06/2006   S/p Left CEA   . Mitral regurgitation - onexam 09/03/2016    Past Surgical History:  Procedure Laterality Date  . AMPUTATION     left index finger -tramatic  . CARDIAC CATHETERIZATION  November 2011   40% mid LAD, 50% proximal RCA, 30-40% distal RCA (DOMINANT RCA with wraparound PDA & 2 large PLBs), 60-70% ostial OM1. No change from 2001. Medical therapy  . CAROTID ENDARTERECTOMY Left 2007   Dr.Hayes: 08/2016 Dopplers: Stable  less than 40% right internal carotid stenosis and stable moderate 40-60% left internal carotid stenosis. Patent vertebral and subclavian arteries.  . CARPAL TUNNEL RELEASE    . CORNEAL TRANSPLANT    . CORONARY ANGIOPLASTY WITH STENT PLACEMENT  09/2013; 05/2014    d) Promus DES 3.0 mm x 28 mm (3.2 mm)  to RCA with STEMI; residual 70% mid-distal LAD,  OM 270-80%. Medical management; b) PTCA of 75 % ISR , LAD lesion noted as ~40%  . FOOT SURGERY     left  . HERNIA REPAIR    . LEFT HEART CATHETERIZATION WITH CORONARY ANGIOGRAM N/A 10/05/2013   Procedure: LEFT HEART CATHETERIZATION WITH CORONARY ANGIOGRAM;  Surgeon: Peter M Martinique, MD;  Location: [facility] CATH LAB;  Service: Cardiovascular;  RCA 100% - PCI,CxOM stable, LAD ~70% mid;  . LEFT HEART CATHETERIZATION WITH CORONARY ANGIOGRAM N/A 06/08/2014   Procedure: LEFT HEART CATHETERIZATION WITH CORONARY ANGIOGRAM;  Surgeon: Troy Sine, MD;  Location: [facility] CATH LAB;  Service: Cardiovascular;  UA 6/'15: 75% ISR in RCA - PTCA, LAD lesion ~40%, CxOM stable.    Marland Kitchen NM MYOVIEW LTD  03/2017    - Elevatred Troponin in setting of Sepsis -- DEMAND ISCHEMIA.  Normal perfusion. LVEF 51% with normal wall motion. This is a low risk study.  Marland Kitchen PARTIAL GASTRECTOMY    . PERCUTANEOUS CORONARY INTERVENTION-BALLOON ONLY  06/08/2014   Procedure: PERCUTANEOUS CORONARY INTERVENTION-BALLOON ONLY;  Surgeon: Troy Sine, MD;  Location: [facility] CATH LAB;  Service: Cardiovascular;;  . ROTATOR CUFF REPAIR     left  . SHOULDER ARTHROSCOPY WITH ROTATOR CUFF REPAIR AND SUBACROMIAL DECOMPRESSION Right 07/01/2013   Procedure: RIGHT SHOULDER ARTHROSCOPY WITH  SUBACROMIAL DECOMPRESSION/DISTAL CLAVICLE RESECTION/ROTATOR CUFF REPAIR ;  Surgeon: Marin Shutter, MD;  Location: [facility];  Service: Orthopedics;  Laterality: Right;  . SPINE SURGERY  1985   cervical laminectomy  . TOTAL KNEE ARTHROPLASTY    . TRANSTHORACIC ECHOCARDIOGRAM  09/'17; 4/'18   a) Normal EF 55-60%. Mild to moderate aortic stenosis with moderate regurgitation. Mild MR.;; b) Mild global reduction in LV systolic function: 22-02%; grade 1 DD - high LVEDP. Mild AS/AI/TR. Mild-Mod MR; moderately elevated PAP.  Marland Kitchen VAGOTOMY     partial gastrectomy    Social History   Socioeconomic History  . Marital status: Married    Spouse name: Not on file  . Number of children: Not on  file  . Years of education: Not on file  . Highest education level: Not on file  Occupational History    Employer: RETIRED    Comment: Retired  Scientific laboratory technician  . Financial resource strain: Not on file  . Food insecurity:    Worry: Not on file    Inability: Not on file  . Transportation needs:    Medical: Not on file    Non-medical: Not on file  Tobacco Use  . Smoking status: Former Smoker    Types: Cigarettes    Last attempt to quit: 12/23/1964    Years since quitting: 54.0  . Smokeless tobacco: Never Used  Substance and Sexual Activity  . Alcohol use: No  . Drug use: No  . Sexual activity: Not on file  Lifestyle  . Physical activity:    Days per week: Not on file    Minutes per session: Not on file  . Stress: Not on file  Relationships  . Social connections:    Talks on phone: Not on file    Gets together: Not on file  Attends religious service: Not on file    Active member of club or organization: Not on file    Attends meetings of clubs or organizations: Not on file    Relationship status: Not on file  . Intimate partner violence:    Fear of current or ex partner: Not on file    Emotionally abused: Not on file    Physically abused: Not on file    Forced sexual activity: Not on file  Other Topics Concern  . Not on file  Social History Narrative   He is married with 2 children. He lives with his wife.   Very active at home, prior to a STEMI, he could walk several miles without discomfort. Prior to his MI, he would use his push mower to mow his entire lawn.   He is a former smoker and quit back in 1966. He does not drink alcohol    Family History  Problem Relation Age of Onset  . Heart disease Sister        Heart Transplant   . Diabetes Sister     Current Facility-Administered Medications  Medication Dose Route Frequency Provider Last Rate Last Dose  . 0.9 %  sodium chloride infusion  250 mL Intravenous PRN Emokpae, Courage, MD      . acetaminophen (TYLENOL)  suppository 650 mg  650 mg Rectal Q6H PRN Denton Brick, Courage, MD   650 mg at 01/02/19 0646  . acetaminophen (TYLENOL) tablet 1,000 mg  1,000 mg Oral On Call to OR Roxan Hockey, MD      . acetaminophen (TYLENOL) tablet 325-650 mg  325-650 mg Oral Q6H PRN Emokpae, Courage, MD      . acetaminophen (TYLENOL) tablet 650 mg  650 mg Oral Q6H PRN Emokpae, Courage, MD      . albuterol (PROVENTIL) (2.5 MG/3ML) 0.083% nebulizer solution 2.5 mg  2.5 mg Nebulization Q2H PRN Emokpae, Courage, MD      . alum & mag hydroxide-simeth (MAALOX/MYLANTA) 200-200-20 MG/5ML suspension 30 mL  30 mL Oral Q6H PRN Emokpae, Courage, MD      . aspirin EC tablet 81 mg  81 mg Oral Daily Emokpae, Courage, MD      . bisacodyl (DULCOLAX) suppository 10 mg  10 mg Rectal Q12H PRN Emokpae, Courage, MD      . brinzolamide (AZOPT) 1 % ophthalmic suspension 1 drop  1 drop Left Eye Q12H Emokpae, Courage, MD      . bupivacaine liposome (EXPAREL) 1.3 % injection 266 mg  20 mL Infiltration Once Emokpae, Courage, MD      . calcium-vitamin D (OSCAL WITH D) 500-200 MG-UNIT per tablet 1 tablet  1 tablet Oral QODAY Emokpae, Courage, MD      . ceFAZolin (ANCEF) IVPB 2g/100 mL premix  2 g Intravenous On Call to OR Emokpae, Courage, MD      . cefTRIAXone (ROCEPHIN) 2 g in sodium chloride 0.9 % 100 mL IVPB  2 g Intravenous Once Emokpae, Courage, MD      . Chlorhexidine Gluconate Cloth 2 % PADS 6 each  6 each Topical Once Emokpae, Courage, MD      . dextrose 5 %-0.45 % sodium chloride infusion   Intravenous Continuous Denton Brick, Courage, MD 75 mL/hr at 01/01/19 2140    . feeding supplement (ENSURE SURGERY) liquid 237 mL  237 mL Oral BID BM Emokpae, Courage, MD      . fentaNYL (SUBLIMAZE) injection 25-50 mcg  25-50 mcg Intravenous Q1H PRN Roxan Hockey, MD      .  gabapentin (NEURONTIN) capsule 300 mg  300 mg Oral On Call to OR Emokpae, Courage, MD      . guaiFENesin-dextromethorphan (ROBITUSSIN DM) 100-10 MG/5ML syrup 10 mL  10 mL Oral Q4H PRN  Emokpae, Courage, MD      . heparin injection 5,000 Units  5,000 Units Subcutaneous Q8H Emokpae, Courage, MD      . hydrALAZINE (APRESOLINE) tablet 25 mg  25 mg Oral TID Roxan Hockey, MD   25 mg at 01/01/19 2132  . hydrocortisone (ANUSOL-HC) 2.5 % rectal cream 1 application  1 application Topical QID PRN Emokpae, Courage, MD      . hydrocortisone cream 1 % 1 application  1 application Topical TID PRN Emokpae, Courage, MD      . isosorbide mononitrate (IMDUR) 24 hr tablet 30 mg  30 mg Oral Daily Emokpae, Courage, MD   30 mg at 01/01/19 2251  . labetalol (NORMODYNE,TRANDATE) injection 10 mg  10 mg Intravenous Q4H PRN Emokpae, Courage, MD      . latanoprost (XALATAN) 0.005 % ophthalmic solution 1 drop  1 drop Both Eyes QHS Emokpae, Courage, MD      . lip balm (CARMEX) ointment 1 application  1 application Topical BID Roxan Hockey, MD   1 application at 22/97/98 2137  . magic mouthwash  15 mL Oral QID PRN Emokpae, Courage, MD      . menthol-cetylpyridinium (CEPACOL) lozenge 3 mg  1 lozenge Oral PRN Emokpae, Courage, MD      . methocarbamol (ROBAXIN) 1,000 mg in dextrose 5 % 50 mL IVPB  1,000 mg Intravenous Q6H PRN Emokpae, Courage, MD      . metoprolol tartrate (LOPRESSOR) tablet 25 mg  25 mg Oral BID Roxan Hockey, MD   25 mg at 01/01/19 2137  . mirtazapine (REMERON) tablet 7.5 mg  7.5 mg Oral QHS Emokpae, Courage, MD   7.5 mg at 01/01/19 2133  . ondansetron (ZOFRAN) injection 4 mg  4 mg Intravenous Q6H PRN Emokpae, Courage, MD       Or  . ondansetron (ZOFRAN) 8 mg in sodium chloride 0.9 % 50 mL IVPB  8 mg Intravenous Q6H PRN Emokpae, Courage, MD      . phenol (CHLORASEPTIC) mouth spray 1-2 spray  1-2 spray Mouth/Throat PRN Emokpae, Courage, MD      . polyethylene glycol (MIRALAX / GLYCOLAX) packet 17 g  17 g Oral Daily PRN Emokpae, Courage, MD      . pravastatin (PRAVACHOL) tablet 20 mg  20 mg Oral q1800 Emokpae, Courage, MD      . prochlorperazine (COMPAZINE) injection 5-10 mg  5-10 mg  Intravenous Q4H PRN Emokpae, Courage, MD      . sodium chloride flush (NS) 0.9 % injection 3 mL  3 mL Intravenous Q12H Emokpae, Courage, MD      . sodium chloride flush (NS) 0.9 % injection 3 mL  3 mL Intravenous PRN Emokpae, Courage, MD      . traZODone (DESYREL) tablet 50 mg  50 mg Oral QHS PRN Roxan Hockey, MD         Allergies  Allergen Reactions  . Amlodipine Besylate Hives  . Hydralazine Itching  . Lipitor [Atorvastatin] Itching  . Naproxen Diarrhea  . Oxycodone-Acetaminophen Itching  . Isosorbide Rash    BP (!) 141/67 (BP Location: Left Arm)   Pulse 74   Temp 100.1 F (37.8 C) (Oral)   Resp 16   Ht 5\' 7"  (1.702 m)   Wt 43.4 kg   SpO2  98%   BMI 14.99 kg/m   Ct Abdomen Pelvis Wo Contrast  Result Date: 01/01/2019 CLINICAL DATA:  85 year old male with history of left lower quadrant abdominal pain radiating to the back. EXAM: CT ABDOMEN AND PELVIS WITHOUT CONTRAST TECHNIQUE: Multidetector CT imaging of the abdomen and pelvis was performed following the standard protocol without IV contrast. COMPARISON:  CT the abdomen and pelvis 05/07/2018. FINDINGS: Lower chest: Aortic atherosclerosis. Calcified atherosclerotic plaque in the right coronary artery. Calcifications of the mitral annulus. Surgical clips adjacent to the gastroesophageal junction. Hepatobiliary: No definite cystic or solid hepatic lesions are confidently identified on today's noncontrast CT examination. Small calcified gallstones in the gallbladder. No findings to suggest an acute cholecystitis at this time. Pancreas: No definite pancreatic mass or peripancreatic fluid or inflammatory changes are noted on today's noncontrast CT examination. Spleen: Unremarkable. Adrenals/Urinary Tract: There are no abnormal calcifications within the collecting system of either kidney, along the course of either ureter, or within the lumen of the urinary bladder. No hydroureteronephrosis or perinephric stranding to suggest urinary tract  obstruction at this time. The unenhanced appearance of the kidneys is unremarkable bilaterally. Unenhanced appearance of the urinary bladder is normal. Bilateral adrenal glands are normal in appearance. Stomach/Bowel: Unenhanced appearance of the stomach is normal. No pathologic dilatation of small bowel or colon. Numerous colonic diverticulae are noted, without surrounding inflammatory changes to suggest an acute diverticulitis at this time. The appendix is not confidently identified and may be surgically absent. Regardless, there are no inflammatory changes noted adjacent to the cecum to suggest the presence of an acute appendicitis at this time. Vascular/Lymphatic: Aortic atherosclerosis. No lymphadenopathy confidently identified in the abdomen or pelvis on today's noncontrast CT examination. Reproductive: Prostate gland and seminal vesicles are unremarkable in appearance. Other: No significant volume of ascites.  No pneumoperitoneum. Musculoskeletal: There are no aggressive appearing lytic or blastic lesions noted in the visualized portions of the skeleton. IMPRESSION: 1. No acute findings are noted in the abdomen or pelvis to account for the patient's symptoms. 2. Severe colonic diverticulosis without definite surrounding inflammatory changes to indicate an acute diverticulitis at this time. 3. Cholelithiasis without evidence of acute cholecystitis. 4. Aortic atherosclerosis, in addition to least right coronary artery disease. 5. There are calcifications of the mitral annulus. Echocardiographic correlation for evaluation of potential valvular dysfunction may be warranted if clinically indicated. Electronically Signed   By: Vinnie Langton M.D.   On: 01/01/2019 14:36   Dg Chest 2 View  Result Date: 01/01/2019 CLINICAL DATA:  Medical clearance for cholecystitis EXAM: CHEST - 2 VIEW COMPARISON:  03/27/2018 FINDINGS: Emphysematous hyperinflation of the lungs without acute pulmonary consolidation. Normal heart  size and mediastinal contours with aortic atherosclerosis. Vascular clip projects over the base of the neck on the left. 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 IMPRESSION: Emphysematous hyperinflation of the lungs without acute pulmonary disease. Electronically Signed   By: Ashley Royalty M.D.   On: 01/01/2019 18:03   Dg Chest Port 1 View  Result Date: 01/02/2019 CLINICAL DATA:  85 y/o  M; unresponsiveness. EXAM: PORTABLE CHEST 1 VIEW COMPARISON:  01/01/2019 chest radiograph FINDINGS: Normal cardiac silhouette. Aortic atherosclerosis with calcification. Clear lungs. No pleural effusion or pneumothorax. No acute osseous abnormality is evident. Surgical clips project over the gastroesophageal junction. IMPRESSION: No acute pulmonary process identified. Electronically Signed   By: Kristine Garbe M.D.   On: 01/02/2019 06:11   US Abdomen Limited Ruq  Result Date: 01/01/2019 CLINICAL DATA:  Upper abdominal pain EXAM: ULTRASOUND ABDOMEN LIMITED RIGHT UPPER QUADRANT COMPARISON:  CT from earlier in the same day. FINDINGS: Gallbladder: Gallbladder is well distended with wall thickening to 5.7 mm and edema within the gallbladder wall. Gallstones are noted. Positive sonographic Murphy's sign is noted. Common bile duct: Diameter: 10 mm Liver: Liver is within normal limits with the exception of a 1 cm hyperechoic focus within the left lobe consistent with a small hemangioma. Portal vein is patent on color Doppler imaging with normal direction of blood flow towards the liver. IMPRESSION: Changes consistent with acute calculous cholecystitis in the appropriate clinical setting. Hyperechoic focus within the left lobe of the liver most consistent with small hemangioma. Electronically Signed   By: Inez Catalina M.D.   On: 01/01/2019 16:34    Note: This dictation was prepared with Dragon/digital dictation along with [employer]. Any transcriptional errors that result from this process are unintentional.   .Adin Hector, M.D., F.A.C.S. Gastrointestinal and Minimally Invasive Surgery [facility] [address] [place], [place] [address] [phone] Main / Paging  01/02/2019 9:07 AM

## 2019-01-02 NOTE — Progress Notes (Signed)
Called to the bedside by RN for a perceived episode of syncope and collapse around 0450 that lasted approximately 10-15 seconds. RN states that she attempted to get pt to bedside commode to use the restroom when he became unresponsive. On assessment, pt is A/O x2. This is close to his baseline as he has mild dementia according to nurse. He responds to verbal commands and states that he is experiencing severe pain in his lower abdomen. LSCTA, grade 3 murmur heard on cardiac assessment, abdomen is distended and tenderness is noted in the left and right lower quadrants. VSS. Neuro exam intact. Pt only complains of severe abdominal pain/discomfort. Denies SOB, chest pain/discomfort, N/V. We will transfer the patient to a telemetry floor for closer monitoring.  Syncope -Unsure if this is a true episode of syncope since pt states that he was awake but just in a severe amount of pain and did not want to respond. However, we will do a thorough workup and transfer pt to telemetry for further monitoring.  -ECG notes some ST elevation, but readings are consistent with previous ECGs and no ST elevation is noted on cardiac monitor. Stat CXR and troponin ordered. - CBC and CMP ordered -Transfer to telemetry floor for closer monitoring.  Acute urine retention and dysuria - Bladder scan shows approximately 600cc of urine retention. Placed a foley in abdomen for acute urine retention.  -urinalysis reflex to culture ordered  Lovey Newcomer, NP [facility] 7p-7a [phone]  CRITICAL CARE Performed by: Neila Gear   Total critical care time: 35 minutes [id number]  Critical care time was exclusive of separately billable procedures and treating other patients.  Critical care was necessary to treat or prevent imminent or life-threatening deterioration.  Critical care was time spent personally by me on the following activities: development of treatment plan with patient and/or surrogate as well as nursing,  discussions with consultants, evaluation of patient's response to treatment, examination of patient, obtaining history from patient or surrogate, ordering and performing treatments and interventions, ordering and review of laboratory studies, ordering and review of radiographic studies, pulse oximetry and re-evaluation of patient's condition.

## 2019-01-02 NOTE — Progress Notes (Signed)
CRITICAL VALUE ALERT  Critical Value:  Troponin 0.05  Date & Time Notied:  01/02/2019; 3606  Provider Notified: X. Blount, NP  Orders Received/Actions taken: No new orders received at this time.

## 2019-01-02 NOTE — Anesthesia Procedure Notes (Signed)
Procedure Name: Intubation Date/Time: 01/02/2019 9:28 AM Performed by: Claudia Desanctis, CRNA Pre-anesthesia Checklist: Patient identified, Emergency Drugs available, Suction available and Patient being monitored Patient Re-evaluated:Patient Re-evaluated prior to induction Oxygen Delivery Method: Circle system utilized Preoxygenation: Pre-oxygenation with 100% oxygen Induction Type: IV induction Ventilation: Mask ventilation without difficulty and Oral airway inserted - appropriate to patient size Laryngoscope Size: 2 and Miller Grade View: Grade I Tube type: Oral Tube size: 7.5 mm Number of attempts: 1 Airway Equipment and Method: Stylet Placement Confirmation: ETT inserted through vocal cords under direct vision,  positive ETCO2 and breath sounds checked- equal and bilateral Secured at: 21 cm Tube secured with: Tape Dental Injury: Teeth and Oropharynx as per pre-operative assessment

## 2019-01-02 NOTE — Anesthesia Postprocedure Evaluation (Signed)
Anesthesia Post Note  Patient: Nicholas Porter  Procedure(s) Performed: LAPAROSCOPIC CHOLECYSTECTOMY (N/A Abdomen)     Patient location during evaluation: PACU Anesthesia Type: General Level of consciousness: awake Pain management: pain level controlled Vital Signs Assessment: post-procedure vital signs reviewed and stable Respiratory status: spontaneous breathing, nonlabored ventilation, respiratory function stable and patient connected to nasal cannula oxygen Cardiovascular status: blood pressure returned to baseline and stable Postop Assessment: no apparent nausea or vomiting Anesthetic complications: no    Last Vitals:  Vitals:   01/02/19 1230 01/02/19 1257  BP: (!) 146/69 129/75  Pulse: 70 70  Resp: 18 18  Temp: 36.7 C 36.6 C  SpO2: 100% 100%    Last Pain:  Vitals:   01/02/19 1257  TempSrc: Oral  PainSc:                  Broox Lonigro P Kevin Space

## 2019-01-03 ENCOUNTER — Inpatient Hospital Stay (HOSPITAL_COMMUNITY): Payer: Medicare Other

## 2019-01-03 ENCOUNTER — Encounter (HOSPITAL_COMMUNITY): Payer: Self-pay | Admitting: Surgery

## 2019-01-03 DIAGNOSIS — N183 Chronic kidney disease, stage 3 unspecified: Secondary | ICD-10-CM | POA: Diagnosis present

## 2019-01-03 DIAGNOSIS — I361 Nonrheumatic tricuspid (valve) insufficiency: Secondary | ICD-10-CM

## 2019-01-03 LAB — BASIC METABOLIC PANEL
Anion gap: 11 (ref 5–15)
BUN: 67 mg/dL — AB (ref 8–23)
CO2: 22 mmol/L (ref 22–32)
CREATININE: 2.7 mg/dL — AB (ref 0.61–1.24)
Calcium: 8.3 mg/dL — ABNORMAL LOW (ref 8.9–10.3)
Chloride: 105 mmol/L (ref 98–111)
GFR calc Af Amer: 24 mL/min — ABNORMAL LOW (ref >60)
GFR calc non Af Amer: 20 mL/min — ABNORMAL LOW (ref >60)
Glucose, Bld: 115 mg/dL — ABNORMAL HIGH (ref 70–99)
Potassium: 4.5 mmol/L (ref 3.5–5.1)
Sodium: 138 mmol/L (ref 135–145)

## 2019-01-03 LAB — ECHOCARDIOGRAM COMPLETE
Height: 67 in
Weight: 1530.87 oz

## 2019-01-03 LAB — CBC WITH DIFFERENTIAL/PLATELET
Abs Immature Granulocytes: 0.06 10*3/uL (ref 0.00–0.07)
Basophils Absolute: 0 10*3/uL (ref 0.0–0.1)
Basophils Relative: 0 %
EOS PCT: 0 %
Eosinophils Absolute: 0 10*3/uL (ref 0.0–0.5)
HCT: 35 % — ABNORMAL LOW (ref 39.0–52.0)
Hemoglobin: 11.3 g/dL — ABNORMAL LOW (ref 13.0–17.0)
Immature Granulocytes: 1 %
Lymphocytes Relative: 7 %
Lymphs Abs: 0.6 10*3/uL — ABNORMAL LOW (ref 0.7–4.0)
MCH: 33.8 pg (ref 26.0–34.0)
MCHC: 32.3 g/dL (ref 30.0–36.0)
MCV: 104.8 fL — ABNORMAL HIGH (ref 80.0–100.0)
MONOS PCT: 5 %
Monocytes Absolute: 0.4 10*3/uL (ref 0.1–1.0)
Neutro Abs: 7.5 10*3/uL (ref 1.7–7.7)
Neutrophils Relative %: 87 %
Platelets: 60 10*3/uL — ABNORMAL LOW (ref 150–400)
RBC: 3.34 MIL/uL — ABNORMAL LOW (ref 4.22–5.81)
RDW: 14.1 % (ref 11.5–15.5)
WBC: 8.5 10*3/uL (ref 4.0–10.5)
nRBC: 0 % (ref 0.0–0.2)

## 2019-01-03 LAB — MAGNESIUM: Magnesium: 1.8 mg/dL (ref 1.7–2.4)

## 2019-01-03 MED ORDER — SODIUM CHLORIDE 0.9 % IV SOLN
INTRAVENOUS | Status: DC
  Administered 2019-01-03 – 2019-01-04 (×2): via INTRAVENOUS

## 2019-01-03 NOTE — Progress Notes (Addendum)
PT Cancellation Note  Patient Details Name: Nicholas Porter MRN: [id number] DOB: 11/19/1930   Cancelled Treatment:    Reason Eval/Treat Not Completed: Fatigue/lethargy limiting ability to participate. Nursing aware. Will check back tomorrow.   Floria Raveling. Hartnett-Rands, MS, PT [facility] #[id number] 01/03/2019, 2:32 PM

## 2019-01-03 NOTE — Progress Notes (Signed)
OT Cancellation Note  Patient Details Name: Nicholas Porter MRN: [id number] DOB: 10/23/1930   Cancelled Treatment:    Reason Eval/Treat Not Completed: Other (comment)(with doctor, then nurse) Will check back for OT eval later date.  Sherrol Vicars A Saki Legore 01/03/2019, 11:19 AM

## 2019-01-03 NOTE — Evaluation (Signed)
SLP Cancellation Note  Patient Details Name: Domonick Sittner MRN: [id number] DOB: [birth year]   Cancelled treatment:       Reason Eval/Treat Not Completed: Other (comment)(order for BSE to start 1/13 after approx 1100 received, pt with pill dysphagia;  will conduct swallow eval 1/13 as ordered)  Luanna Salk, [facility] SLP Acute Rehab Services Pager [phone] Office [phone]  Macario Golds 01/03/2019, 3:10 PM

## 2019-01-03 NOTE — Progress Notes (Signed)
  Echocardiogram 2D Echocardiogram has been performed.  Nicholas Porter Nicholas Porter 01/03/2019, 11:05 AM

## 2019-01-03 NOTE — Progress Notes (Signed)
[place] [id number] [birth year]  CARE TEAM:  PCP: Dorothyann Peng, NP  Outpatient Care Team: Patient Care Team: Dorothyann Peng, NP as PCP - General (Family Medicine) Leonie Man, MD as PCP - Cardiology (Cardiology) Edrick Oh, MD as Consulting Physician (Nephrology) Clarene Essex, MD as Consulting Physician (Gastroenterology)  Inpatient Treatment Team: Treatment Team: Attending Provider: Roxan Hockey, MD; Consulting Physician: Nolon Nations, MD; Rounding Team: Garner Gavel, MD; Technician: Kizzie Furnish, NT; Physical Therapist: Jeannie Done, PT; Speech Language Pathologist: Narda Rutherford, CCC-SLP; Registered Nurse: Saunders Glance, RN; Occupational Therapist: Merleen Milliner, OT   Problem List:   Principal Problem:   Acute gangrenous cholecystitis s/p lap cholecystectomy 01/02/2019 Active Problems:   CKD (chronic kidney disease) stage 4, GFR 15-29 ml/min (HCC)   Essential hypertension   CAD S/P percutaneous coronary angioplasty - DES in RCA with PTCA for ISR; Moderate mLAD, 80% ostial OM2 stable   H/O Inferior STEMI (09/2013) emergent PCI with Promus DES to RCA (3.0 mm  x 28 mm - 3.2 mm)   GERD (gastroesophageal reflux disease)   Malnutrition of moderate degree   Benign prostatic hyperplasia   Unintentional weight loss   History of medication noncompliance   History of partial gastrectomy   Pill dysphagia   1 Day Post-Op  01/02/2019  POST-OPERATIVE DIAGNOSIS:   Acute Gangrenous Cholecystitis History of vagatomy/antrectomy  PROCEDURE:  Laparoscopic cholecystectomy  Diagnostic laparoscopy Laparoscopic lysis adhesions x45 minutes (1/2 of case)  SURGEON:  Adin Hector, MD, FACS.     Assessment  Improved  [facility] Stay = 2 days)  Plan:  -Advance to solid diet.  Follow off IV fluids.  Antibiotics.  Suspect he will need 5 days total.  IV for now.  If ready discharge sooner, can switch over to oral Augmentin.  Keep  drain.  If output goes down and is more clear, can remove prior to discharge.  Otherwise may need to go home with follow-up in office.  -VTE prophylaxis- SCDs, etc -mobilize as tolerated to help recovery  20 minutes spent in review, evaluation, examination, counseling, and coordination of care.  More than 50% of that time was spent in counseling.  I updated the patient's status to the patient and family.  Recommendations were made.  Questions were answered.  They expressed understanding & appreciation.   01/03/2019    Subjective: (Chief complaint)  Abdominal pain much less.  Confused last night but feeling better now.  Wife and daughter at bedside.  Feels much better overall.  Hungry.  Objective:  Vital signs:  Vitals:   01/02/19 1703 01/02/19 2157 01/03/19 0149 01/03/19 0552  BP: 128/72 (!) 145/62 (!) 124/59 (!) 100/51  Pulse: 79 77 63 64  Resp: 16 18 14 14   Temp: 98.9 F (37.2 C) 99.1 F (37.3 C) 98.3 F (36.8 C) 98.2 F (36.8 C)  TempSrc: Oral Oral Oral Oral  SpO2: 100% 100% 100% 100%  Weight:      Height:        Last BM Date: (UTA )  Intake/Output   Yesterday:  01/11 0701 - 01/12 0700 In: 1900.8 [I.V.:1750.8; IV Piggyback:150] Out: 1100 [Urine:445; Drains:605; Blood:50] This shift:  Total I/O In: -  Out: 90 [Drains:90]  Bowel function:  Flatus: YES  BM:  No  Drain: Thinly bloody but dark   Physical Exam:  General: Pt awake/alert/oriented x4 in no acute distress Eyes: PERRL, normal EOM.  Sclera clear.  No icterus Neuro: CN II-XII intact w/o focal sensory/motor  deficits. Lymph: No head/neck/groin lymphadenopathy Psych:  No delerium/psychosis/paranoia HENT: Normocephalic, Mucus membranes moist.  No thrush Neck: Supple, No tracheal deviation Chest: No chest wall pain w good excursion CV:  Pulses intact.  Regular rhythm MS: Normal AROM mjr joints.  No obvious deformity  Abdomen: Soft.  Nondistended.  Mildly tender at incisions only.  No  evidence of peritonitis.  No incarcerated hernias.  Ext:   No deformity.  No mjr edema.  No cyanosis Skin: No petechiae / purpura  Results:   Labs: Results for orders placed or performed during the hospital encounter of 01/01/19 (from the past 48 hour(s))  CBC with Differential/Platelet     Status: Abnormal   Collection Time: 01/01/19 11:50 AM  Result Value Ref Range   WBC 11.2 (H) 4.0 - 10.5 K/uL   RBC 3.95 (L) 4.22 - 5.81 MIL/uL   Hemoglobin 13.0 13.0 - 17.0 g/dL   HCT 38.5 (L) 39.0 - 52.0 %   MCV 97.5 80.0 - 100.0 fL   MCH 32.9 26.0 - 34.0 pg   MCHC 33.8 30.0 - 36.0 g/dL   RDW 13.1 11.5 - 15.5 %   Platelets 92 (L) 150 - 400 K/uL    Comment: Immature Platelet Fraction may be clinically indicated, consider ordering this additional test [id number]    nRBC 0.0 0.0 - 0.2 %   Neutrophils Relative % 87 %   Neutro Abs 9.7 (H) 1.7 - 7.7 K/uL   Lymphocytes Relative 4 %   Lymphs Abs 0.4 (L) 0.7 - 4.0 K/uL   Monocytes Relative 9 %   Monocytes Absolute 1.0 0.1 - 1.0 K/uL   Eosinophils Relative 0 %   Eosinophils Absolute 0.0 0.0 - 0.5 K/uL   Basophils Relative 0 %   Basophils Absolute 0.0 0.0 - 0.1 K/uL   Immature Granulocytes 0 %   Abs Immature Granulocytes 0.05 0.00 - 0.07 K/uL    Comment: Performed at [facility], [place] [address] [place], [place] [address]  Comprehensive metabolic panel     Status: Abnormal   Collection Time: 01/01/19 11:50 AM  Result Value Ref Range   Sodium 142 135 - 145 mmol/L   Potassium 4.7 3.5 - 5.1 mmol/L   Chloride 108 98 - 111 mmol/L   CO2 23 22 - 32 mmol/L   Glucose, Bld 152 (H) 70 - 99 mg/dL   BUN 46 (H) 8 - 23 mg/dL   Creatinine, Ser 1.75 (H) 0.61 - 1.24 mg/dL   Calcium 9.4 8.9 - 10.3 mg/dL   Total Protein 7.3 6.5 - 8.1 g/dL   Albumin 4.3 3.5 - 5.0 g/dL   AST 20 15 - 41 U/L   ALT 9 0 - 44 U/L   Alkaline Phosphatase 90 38 - 126 U/L   Total Bilirubin 1.4 (H) 0.3 - 1.2 mg/dL   GFR calc non Af Amer 34 (L) >60 mL/min   GFR  calc Af Amer 40 (L) >60 mL/min   Anion gap 11 5 - 15    Comment: Performed at [facility], [place] [address], [place], [place] [address]  Lipase, blood     Status: None   Collection Time: 01/01/19 11:50 AM  Result Value Ref Range   Lipase 41 11 - 51 U/L    Comment: Performed at [facility], [place] [address], [place], [place] [address]  I-stat troponin, ED     Status: None   Collection Time: 01/01/19 12:04 PM  Result Value Ref Range  Troponin i, poc 0.02 0.00 - 0.08 ng/mL   Comment 3            Comment: Due to the release kinetics of cTnI, a negative result within the first hours of the onset of symptoms does not rule out myocardial infarction with certainty. If myocardial infarction is still suspected, repeat the test at appropriate intervals.   Vitamin B12     Status: None   Collection Time: 01/01/19  7:57 PM  Result Value Ref Range   Vitamin B-12 248 180 - 914 pg/mL    Comment: (NOTE) This assay is not validated for testing neonatal or myeloproliferative syndrome specimens for Vitamin B12 levels. Performed at [facility], [place] [address] [place], [place] [address]   Folate     Status: None   Collection Time: 01/01/19  7:57 PM  Result Value Ref Range   Folate 10.9 >5.9 ng/mL    Comment: Performed at [facility], [place] [address], [place], [place] [address]  Iron and TIBC     Status: Abnormal   Collection Time: 01/01/19  7:57 PM  Result Value Ref Range   Iron 18 (L) 45 - 182 ug/dL   TIBC 286 250 - 450 ug/dL   Saturation Ratios 6 (L) 17.9 - 39.5 %   UIBC 268 ug/dL    Comment: Performed at [facility], [place] [address], [place], [place] [address]  Ferritin     Status: Abnormal   Collection Time: 01/01/19  7:57 PM  Result Value Ref Range   Ferritin 339 (H) 24 - 336 ng/mL    Comment: Performed at [facility], [place] [address] [place], [place] [address]    Reticulocytes     Status: None   Collection Time: 01/01/19  7:57 PM  Result Value Ref Range   Retic Ct Pct 1.3 0.4 - 3.1 %   RBC. 4.32 4.22 - 5.81 MIL/uL   Retic Count, Absolute 55.7 19.0 - 186.0 K/uL   Immature Retic Fract 10.9 2.3 - 15.9 %    Comment: Performed at [facility], [place] [address], [place], [place] [address]  Prealbumin     Status: None   Collection Time: 01/01/19  7:57 PM  Result Value Ref Range   Prealbumin 23.1 18 - 38 mg/dL    Comment: Performed at [facility], [place] [address] [place], [place] [address]  Blood culture (routine x 2)     Status: None (Preliminary result)   Collection Time: 01/01/19  7:58 PM  Result Value Ref Range   Specimen Description      BLOOD LEFT ANTECUBITAL Performed at [facility], [place] [address], [place], [place] [address]    Special Requests      BOTTLES DRAWN AEROBIC AND ANAEROBIC Blood Culture adequate volume Performed at [place] [address] [place], [place] [address]    Culture      NO GROWTH 2 DAYS Performed at [facility], [place] [address], [place], [place] [address]    Report Status PENDING   Blood culture (routine x 2)     Status: None (Preliminary result)   Collection Time: 01/01/19  7:58 PM  Result Value Ref Range   Specimen Description      BLOOD BLOOD LEFT FOREARM Performed at [place] [address] [place], [place] [address]    Special Requests      BOTTLES DRAWN AEROBIC  AND ANAEROBIC Blood Culture adequate volume Performed at [place] [address], [place], [place] [address]    Culture      NO GROWTH 2 DAYS Performed at [facility], [place] [address], [place], [place] [address]    Report Status PENDING   Creatinine, serum     Status: Abnormal   Collection Time: 01/01/19  9:40 PM  Result Value Ref Range   Creatinine, Ser 1.96 (H) 0.61 - 1.24 mg/dL   GFR calc non Af  Amer 30 (L) >60 mL/min   GFR calc Af Amer 35 (L) >60 mL/min    Comment: Performed at [facility], [place] [address], [place], [place] [address]  CBC     Status: Abnormal   Collection Time: 01/02/19  4:58 AM  Result Value Ref Range   WBC 19.8 (H) 4.0 - 10.5 K/uL   RBC 3.85 (L) 4.22 - 5.81 MIL/uL   Hemoglobin 12.9 (L) 13.0 - 17.0 g/dL   HCT 38.7 (L) 39.0 - 52.0 %   MCV 100.5 (H) 80.0 - 100.0 fL   MCH 33.5 26.0 - 34.0 pg   MCHC 33.3 30.0 - 36.0 g/dL   RDW 13.7 11.5 - 15.5 %   Platelets 89 (L) 150 - 400 K/uL    Comment: REPEATED TO VERIFY Immature Platelet Fraction may be clinically indicated, consider ordering this additional test [id number] CONSISTENT WITH PREVIOUS RESULT    nRBC 0.0 0.0 - 0.2 %    Comment: Performed at [facility], [place] [address], [place], [place] [address]  Glucose, capillary     Status: Abnormal   Collection Time: 01/02/19  5:03 AM  Result Value Ref Range   Glucose-Capillary 142 (H) 70 - 99 mg/dL   Comment 1 Notify RN   Troponin I - Now Then Q6H     Status: Abnormal   Collection Time: 01/02/19  5:14 AM  Result Value Ref Range   Troponin I 0.05 (HH) <0.03 ng/mL    Comment: CRITICAL RESULT CALLED TO, READ BACK BY AND VERIFIED WITH: ASARO,J. RN AT 0347 01/02/19 MULLINS,T Performed at [facility], [place] [address], [place], [place] [address]   Urinalysis, Routine w reflex microscopic     Status: Abnormal   Collection Time: 01/02/19  5:55 AM  Result Value Ref Range   Color, Urine YELLOW YELLOW   APPearance CLEAR CLEAR   Specific Gravity, Urine 1.008 1.005 - 1.030   pH 5.0 5.0 - 8.0   Glucose, UA NEGATIVE NEGATIVE mg/dL   Hgb urine dipstick SMALL (A) NEGATIVE   Bilirubin Urine NEGATIVE NEGATIVE   Ketones, ur NEGATIVE NEGATIVE mg/dL   Protein, ur 30 (A) NEGATIVE mg/dL   Nitrite NEGATIVE NEGATIVE   Leukocytes, UA NEGATIVE NEGATIVE   RBC / HPF 0-5 0 - 5 RBC/hpf   WBC, UA 0-5 0 - 5 WBC/hpf   Bacteria, UA  RARE (A) NONE SEEN   Mucus PRESENT     Comment: Performed at [facility], [place] [address] [place], [place] [address]  Surgical pcr screen     Status: None   Collection Time: 01/02/19  5:57 AM  Result Value Ref Range   MRSA, PCR NEGATIVE NEGATIVE   Staphylococcus aureus NEGATIVE NEGATIVE    Comment: (NOTE) The Xpert SA Assay (FDA approved for NASAL specimens in patients 76 years of age and older), is one component of a comprehensive surveillance program. It is not intended to diagnose infection nor  to guide or monitor treatment. Performed at [facility], [place] [address] [place], [place] [address]   Glucose, capillary     Status: Abnormal   Collection Time: 01/02/19 11:31 AM  Result Value Ref Range   Glucose-Capillary 148 (H) 70 - 99 mg/dL  Comprehensive metabolic panel     Status: Abnormal   Collection Time: 01/02/19  1:35 PM  Result Value Ref Range   Sodium 141 135 - 145 mmol/L   Potassium 4.9 3.5 - 5.1 mmol/L   Chloride 107 98 - 111 mmol/L   CO2 21 (L) 22 - 32 mmol/L   Glucose, Bld 148 (H) 70 - 99 mg/dL   BUN 59 (H) 8 - 23 mg/dL   Creatinine, Ser 2.33 (H) 0.61 - 1.24 mg/dL   Calcium 8.6 (L) 8.9 - 10.3 mg/dL   Total Protein 6.4 (L) 6.5 - 8.1 g/dL   Albumin 3.7 3.5 - 5.0 g/dL   AST 98 (H) 15 - 41 U/L   ALT 69 (H) 0 - 44 U/L   Alkaline Phosphatase 75 38 - 126 U/L   Total Bilirubin 0.9 0.3 - 1.2 mg/dL   GFR calc non Af Amer 24 (L) >60 mL/min   GFR calc Af Amer 28 (L) >60 mL/min   Anion gap 13 5 - 15    Comment: Performed at [facility], [place] [address], [place], [place] [address]  Troponin I - Once     Status: Abnormal   Collection Time: 01/02/19  1:35 PM  Result Value Ref Range   Troponin I 0.06 (HH) <0.03 ng/mL    Comment: CRITICAL VALUE NOTED.  VALUE IS CONSISTENT WITH PREVIOUSLY REPORTED AND CALLED VALUE. Performed at [facility], [place] [address], [place], [place] [address]   Magnesium      Status: Abnormal   Collection Time: 01/02/19  1:35 PM  Result Value Ref Range   Magnesium 1.6 (L) 1.7 - 2.4 mg/dL    Comment: Performed at [facility], [name] [address], [place], [place] [address]  Troponin I - Now Then Q6H     Status: Abnormal   Collection Time: 01/02/19  8:00 PM  Result Value Ref Range   Troponin I 0.05 (HH) <0.03 ng/mL    Comment: CRITICAL VALUE NOTED.  VALUE IS CONSISTENT WITH PREVIOUSLY REPORTED AND CALLED VALUE. Performed at [facility], [place] [address] [place], [place] [address]     Imaging / Studies: Ct Abdomen Pelvis Wo Contrast  Result Date: 01/01/2019 CLINICAL DATA:  85 year old male with history of left lower quadrant abdominal pain radiating to the back. EXAM: CT ABDOMEN AND PELVIS WITHOUT CONTRAST TECHNIQUE: Multidetector CT imaging of the abdomen and pelvis was performed following the standard protocol without IV contrast. COMPARISON:  CT the abdomen and pelvis 05/07/2018. FINDINGS: Lower chest: Aortic atherosclerosis. Calcified atherosclerotic plaque in the right coronary artery. Calcifications of the mitral annulus. Surgical clips adjacent to the gastroesophageal junction. Hepatobiliary: No definite cystic or solid hepatic lesions are confidently identified on today's noncontrast CT examination. Small calcified gallstones in the gallbladder. No findings to suggest an acute cholecystitis at this time. Pancreas: No definite pancreatic mass or peripancreatic fluid or inflammatory changes are noted on today's noncontrast CT examination. Spleen: Unremarkable. Adrenals/Urinary Tract: There are no abnormal calcifications within the collecting system of either kidney, along the course of either ureter, or within the lumen of the urinary bladder. No hydroureteronephrosis or perinephric stranding to suggest urinary tract obstruction at  this time. The unenhanced appearance of the kidneys is unremarkable bilaterally. Unenhanced appearance  of the urinary bladder is normal. Bilateral adrenal glands are normal in appearance. Stomach/Bowel: Unenhanced appearance of the stomach is normal. No pathologic dilatation of small bowel or colon. Numerous colonic diverticulae are noted, without surrounding inflammatory changes to suggest an acute diverticulitis at this time. The appendix is not confidently identified and may be surgically absent. Regardless, there are no inflammatory changes noted adjacent to the cecum to suggest the presence of an acute appendicitis at this time. Vascular/Lymphatic: Aortic atherosclerosis. No lymphadenopathy confidently identified in the abdomen or pelvis on today's noncontrast CT examination. Reproductive: Prostate gland and seminal vesicles are unremarkable in appearance. Other: No significant volume of ascites.  No pneumoperitoneum. Musculoskeletal: There are no aggressive appearing lytic or blastic lesions noted in the visualized portions of the skeleton. IMPRESSION: 1. No acute findings are noted in the abdomen or pelvis to account for the patient's symptoms. 2. Severe colonic diverticulosis without definite surrounding inflammatory changes to indicate an acute diverticulitis at this time. 3. Cholelithiasis without evidence of acute cholecystitis. 4. Aortic atherosclerosis, in addition to least right coronary artery disease. 5. There are calcifications of the mitral annulus. Echocardiographic correlation for evaluation of potential valvular dysfunction may be warranted if clinically indicated. Electronically Signed   By: Vinnie Langton M.D.   On: 01/01/2019 14:36   Dg Chest 2 View  Result Date: 01/01/2019 CLINICAL DATA:  Medical clearance for cholecystitis EXAM: CHEST - 2 VIEW COMPARISON:  03/27/2018 FINDINGS: Emphysematous hyperinflation of the lungs without acute pulmonary consolidation. Normal heart size and mediastinal contours with aortic atherosclerosis. Vascular clip projects over the base of the neck on the  left. 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 IMPRESSION: Emphysematous hyperinflation of the lungs without acute pulmonary disease. Electronically Signed   By: Ashley Royalty M.D.   On: 01/01/2019 18:03   Dg Chest Port 1 View  Result Date: 01/02/2019 CLINICAL DATA:  85 y/o  M; unresponsiveness. EXAM: PORTABLE CHEST 1 VIEW COMPARISON:  01/01/2019 chest radiograph FINDINGS: Normal cardiac silhouette. Aortic atherosclerosis with calcification. Clear lungs. No pleural effusion or pneumothorax. No acute osseous abnormality is evident. Surgical clips project over the gastroesophageal junction. IMPRESSION: No acute pulmonary process identified. Electronically Signed   By: Kristine Garbe M.D.   On: 01/02/2019 06:11   US Abdomen Limited Ruq  Result Date: 01/01/2019 CLINICAL DATA:  Upper abdominal pain EXAM: ULTRASOUND ABDOMEN LIMITED RIGHT UPPER QUADRANT COMPARISON:  CT from earlier in the same day. FINDINGS: Gallbladder: Gallbladder is well distended with wall thickening to 5.7 mm and edema within the gallbladder wall. Gallstones are noted. Positive sonographic Murphy's sign is noted. Common bile duct: Diameter: 10 mm Liver: Liver is within normal limits with the exception of a 1 cm hyperechoic focus within the left lobe consistent with a small hemangioma. Portal vein is patent on color Doppler imaging with normal direction of blood flow towards the liver. IMPRESSION: Changes consistent with acute calculous cholecystitis in the appropriate clinical setting. Hyperechoic focus within the left lobe of the liver most consistent with small hemangioma. Electronically Signed   By: Inez Catalina M.D.   On: 01/01/2019 16:34    Medications / Allergies: per chart  Antibiotics: Anti-infectives (From admission, onward)   Start     Dose/Rate Route Frequency Ordered Stop   01/02/19 1400  piperacillin-tazobactam (ZOSYN) IVPB 2.25 g   Status:  Discontinued  2.25 g 100 mL/hr over 30 Minutes Intravenous Every 8 hours 01/02/19 1223 01/02/19 1224   01/02/19 1400  piperacillin-tazobactam (ZOSYN) IVPB 2.25 g     2.25 g 100 mL/hr over 30 Minutes Intravenous Every 8 hours 01/02/19 1224 01/07/19 1359   01/02/19 0909  ceFAZolin (ANCEF) 2-4 GM/100ML-% IVPB  Status:  Discontinued    Note to Pharmacy:  Guerry Bruin   : cabinet override      01/02/19 0909 01/02/19 0916   01/02/19 0600  ceFAZolin (ANCEF) IVPB 2g/100 mL premix  Status:  Discontinued     2 g 200 mL/hr over 30 Minutes Intravenous On call to O.R. 01/01/19 1909 01/02/19 1218   01/02/19 0600  metroNIDAZOLE (FLAGYL) IVPB 500 mg     500 mg 100 mL/hr over 60 Minutes Intravenous On call to O.R. 01/01/19 1909 01/02/19 0836   01/01/19 1730  cefTRIAXone (ROCEPHIN) 2 g in sodium chloride 0.9 % 100 mL IVPB  Status:  Discontinued     2 g 200 mL/hr over 30 Minutes Intravenous  Once 01/01/19 1728 01/02/19 1218        Note: Portions of this report may have been transcribed using voice recognition software. Every effort was made to ensure accuracy; however, inadvertent computerized transcription errors may be present.   Any transcriptional errors that result from this process are unintentional.     Adin Hector, MD, FACS, MASCRS Gastrointestinal and Minimally Invasive Surgery    [address], [place], [place] [address] [phone] Main / Paging [phone] Fax

## 2019-01-03 NOTE — Progress Notes (Signed)
Initial Nutrition Assessment  DOCUMENTATION CODES:   Severe malnutrition in context of chronic illness, Underweight  INTERVENTION:   -Continue Ensure Surgery BID, each provides 330 kcal and 18g protein -Provide Magic cup TID with meals, each supplement provides 290 kcal and 9 grams of protein  NUTRITION DIAGNOSIS:   Severe Malnutrition related to chronic illness(COPD, CKD, CHF) as evidenced by severe fat depletion, severe muscle depletion, energy intake < or equal to 75% for > or equal to 1 month.  GOAL:   Patient will meet greater than or equal to 90% of their needs  MONITOR:   PO intake, Supplement acceptance, Labs, Weight trends, I & O's  REASON FOR ASSESSMENT:   Consult Assessment of nutrition requirement/status  ASSESSMENT:   85 y.o. male has medical history relevant for CAD with prior angioplasty and stenting, PAD with prior left carotid endodiathermy, COPD/ patient is a reformed smoker who quit smoking about 50 years ago as well as h/o partial gastrectomy/vagatomy in distant past for ulcer.  1/11: s/p Laparoscopic cholecystectomy , LOA  Patient now consuming 50% of meals. Pt not a reliable historian, per family pt does not eat well at home. Pt is accepting of Ensure supplements and likes ice cream, will order Magic cups.   Per weight records, pt has lost 15 lb over the past year (14% wt loss x 1 year, insignificant for time frame). Pt's weight is steadily trending down.   Medications; OSCAL tablet every other day, Remeron tablet daily  Labs reviewed: GFR: 20   NUTRITION - FOCUSED PHYSICAL EXAM:    Most Recent Value  Orbital Region  Unable to assess  Upper Arm Region  Severe depletion  Thoracic and Lumbar Region  Unable to assess  Buccal Region  Severe depletion  Temple Region  Severe depletion  Clavicle Bone Region  Severe depletion  Clavicle and Acromion Bone Region  Severe depletion  Scapular Bone Region  Unable to assess  Dorsal Hand  Unable to assess   Patellar Region  Unable to assess  Anterior Thigh Region  Unable to assess  Posterior Calf Region  Unable to assess  Edema (RD Assessment)  None       Diet Order:   Diet Order            Diet Heart Room service appropriate? Yes; Fluid consistency: Thin  Diet effective now              EDUCATION NEEDS:   Not appropriate for education at this time  Skin:  Skin Assessment: Reviewed RN Assessment  Last BM:  PTA  Height:   Ht Readings from Last 1 Encounters:  01/01/19 5\' 7"  (1.702 m)    Weight:   Wt Readings from Last 1 Encounters:  01/01/19 43.4 kg    Ideal Body Weight:  67.2 kg  BMI:  Body mass index is 14.99 kg/m.  Estimated Nutritional Needs:   Kcal:  1600-1800  Protein:  75-85g  Fluid:  1.8L/day   Clayton Bibles, MS, RD, LDN [place] Dietitian Pager: [phone] After Hours Pager: [phone]

## 2019-01-03 NOTE — Progress Notes (Signed)
Patient Demographics:    Nicholas Porter, is a 85 y.o. male, DOB - October 26, 1930, WTU:[id number]  Admit date - 01/01/2019   Admitting Physician Kevontae Burgoon Denton Brick, MD  Outpatient Primary MD for the patient is Dorothyann Peng, NP  LOS - 2   Chief Complaint  Patient presents with  . Abdominal Pain  . Nausea        Subjective:    Nicholas Porter today has no fevers, no emesis,  No chest pain, oral intake remains poor, wife at bedside, patient with intermittent confusion and disorientation  Assessment  & Plan :    Principal Problem:   Acute gangrenous cholecystitis s/p lap cholecystectomy 01/02/2019 Active Problems:   Essential hypertension   CAD S/P percutaneous coronary angioplasty - DES in RCA with PTCA for ISR; Moderate mLAD, 80% ostial OM2 stable   H/O Inferior STEMI (09/2013) emergent PCI with Promus DES to RCA (3.0 mm  x 28 mm - 3.2 mm)   GERD (gastroesophageal reflux disease)   Malnutrition of moderate degree   Benign prostatic hyperplasia   CKD (chronic kidney disease) stage 4, GFR 15-29 ml/min (HCC)   Unintentional weight loss   History of medication noncompliance   History of partial gastrectomy   Pill dysphagia  Brief summary 85 y.o. male has medical history relevant for CAD with prior angioplasty and stenting, PAD with prior left carotid endodiathermy, COPD/ patient is a reformed smoker who quit smoking about 50 years ago as well as h/o partial gastrectomy/vagatomy in distant past for ulcer. LAst EGD 2009 with mild gastritis ,CKDIII , unintentional weight loss 60 lbs last year admitted on 01/01/2019 with abdominal pain and found to have calculus cholecystitis, status post lap chole and lysis of adhesions on 01/02/2019 by Dr. Johney Maine, patient subsequently developed AKI   Plan:- 1)Acute calculus cholecystitis-----leukocytosis has resolved, hemoglobin is down to 8.5 from 19.8, general surgeon  recommends total of 5 days of IV antibiotics, s/p  lap chole with lysis of adhesions on 01/02/2019 , surgical consult from Dr. Johney Maine appreciated,, -  (patient received IV Rocephin and Flagyl on 01/01/2019)----further management per surgical team, PRN pain meds PRN antiemetics  2)HTN--stage II, much improved with better control of abdominal pain,  continue. Imdur 30 mg daily along with hydralazine 25 mg 3 times daily and metoprolol 25 mg twice daily,   may use IV labetalol when necessary  Every 4 hours for systolic blood pressure over 160 mmhg  3)Social/Ethics--- Patient and his wife both admit that he is a DNR, Health visitor verified DNR status  4)HFpEF--- patient with history of combined systolic and diastolic dysfunction CHF, repeat echo with preserved EF of 55 to 60% with grade 1 dCHF in the setting of known CAD and possible ischemic cardiomyopathy---   be judicious with IV fluids to avoid volume overload, continue metoprolol, isosorbide/hydralazine combo, , QTC on EKG is normalized  5)H/o CAD and PAD-- CAD with prior angioplasty and stenting, PAD with prior left carotid endarterecmy , patient apparently is intolerant to Lipitor trial low-dose pravastatin, restart aspirin ----continue metoprolol, no ACS type symptoms at this time, EKG without ischemic changes, repeat echo with preserved EF of 55 to 60% with grade 1 dCHF  6)AKI----acute kidney injury on CKD stage - III due to dehydration  in the setting of persistent nausea and abdominal pain with poor oral intake poor oral intake preop and postop,    creatinine on admission= 1.75,  ,   baseline creatinine = 1.9  (04/2018)   , creatinine is now= 2.70     , renally adjust medications, avoid nephrotoxic agents/dehydration/hypotension   7)Anorexia and Weight Loss----consider GI consult for endoluminal evaluation (last colonoscopy apparently about 7 to 10 years ago), continue Remeron for appetite stimulation and sleep, nutritional consult pending, TSH was 2.4 in  April 2019  8) COPD/emphysema----quit smoking about 50 years ago, may use PRN albuterol, no acute flareup of COPD at this time  Disposition/Need for in-Hospital Stay- patient unable to be discharged at this time due to status post lap chole lysis evaluation 01/02/2019, patient needs IV antibiotics given significant gangrenous cholecystitis with white count of almost 20 K, also needs further investigations for near syncopal episode  Code Status : DNR  Family Communication:   wife   Disposition Plan  : TBD (await PT/OT eval)  Consults  :  Gen surg  DVT Prophylaxis  :    - Heparin -   Lab Results  Component Value Date   PLT 60 (L) 01/03/2019    Inpatient Medications  Scheduled Meds: . acetaminophen  1,000 mg Oral TID  . aspirin EC  81 mg Oral Daily  . brinzolamide  1 drop Left Eye Q12H  . bupivacaine liposome  20 mL Infiltration Once  . calcium-vitamin D  1 tablet Oral QODAY  . Chlorhexidine Gluconate Cloth  6 each Topical Once  . feeding supplement  237 mL Oral BID BM  . gabapentin  300 mg Oral QHS  . heparin  5,000 Units Subcutaneous Q8H  . hydrALAZINE  25 mg Oral TID  . isosorbide mononitrate  30 mg Oral Daily  . latanoprost  1 drop Both Eyes QHS  . lip balm  1 application Topical BID  . metoprolol tartrate  25 mg Oral BID  . mirtazapine  7.5 mg Oral QHS  . polyethylene glycol  17 g Oral BID  . pravastatin  20 mg Oral q1800  . sodium chloride flush  3 mL Intravenous Q12H  . sodium chloride flush  3 mL Intravenous Q12H   Continuous Infusions: . sodium chloride    . sodium chloride    . sodium chloride    . lactated ringers    . methocarbamol (ROBAXIN) IV    . ondansetron (ZOFRAN) IV    . piperacillin-tazobactam 2.25 g (01/03/19 1320)   PRN Meds:.sodium chloride, sodium chloride, albuterol, alum & mag hydroxide-simeth, bisacodyl, fentaNYL (SUBLIMAZE) injection, guaiFENesin-dextromethorphan, hydrocortisone, hydrocortisone cream, labetalol, lactated ringers, magic  mouthwash, magnesium hydroxide, menthol-cetylpyridinium, methocarbamol (ROBAXIN) IV, ondansetron (ZOFRAN) IV **OR** ondansetron (ZOFRAN) IV, phenol, polyethylene glycol, prochlorperazine, sodium chloride flush, sodium chloride flush, traMADol, traZODone    Anti-infectives (From admission, onward)   Start     Dose/Rate Route Frequency Ordered Stop   01/02/19 1400  piperacillin-tazobactam (ZOSYN) IVPB 2.25 g  Status:  Discontinued     2.25 g 100 mL/hr over 30 Minutes Intravenous Every 8 hours 01/02/19 1223 01/02/19 1224   01/02/19 1400  piperacillin-tazobactam (ZOSYN) IVPB 2.25 g     2.25 g 100 mL/hr over 30 Minutes Intravenous Every 8 hours 01/02/19 1224 01/07/19 1359   01/02/19 0909  ceFAZolin (ANCEF) 2-4 GM/100ML-% IVPB  Status:  Discontinued    Note to Pharmacy:  Guerry Bruin   : cabinet override      01/02/19 [phone]  01/02/19 0916   01/02/19 0600  ceFAZolin (ANCEF) IVPB 2g/100 mL premix  Status:  Discontinued     2 g 200 mL/hr over 30 Minutes Intravenous On call to O.R. 01/01/19 1909 01/02/19 1218   01/02/19 0600  metroNIDAZOLE (FLAGYL) IVPB 500 mg     500 mg 100 mL/hr over 60 Minutes Intravenous On call to O.R. 01/01/19 1909 01/02/19 0836   01/01/19 1730  cefTRIAXone (ROCEPHIN) 2 g in sodium chloride 0.9 % 100 mL IVPB  Status:  Discontinued     2 g 200 mL/hr over 30 Minutes Intravenous  Once 01/01/19 1728 01/02/19 1218        Objective:   Vitals:   01/02/19 2157 01/03/19 0149 01/03/19 0552 01/03/19 1100  BP: (!) 145/62 (!) 124/59 (!) 100/51 (!) 108/53  Pulse: 77 63 64 66  Resp: 18 14 14 17   Temp: 99.1 F (37.3 C) 98.3 F (36.8 C) 98.2 F (36.8 C) 98.4 F (36.9 C)  TempSrc: Oral Oral Oral Oral  SpO2: 100% 100% 100% 100%  Weight:      Height:        Wt Readings from Last 3 Encounters:  01/01/19 43.4 kg  10/21/18 45.8 kg  10/08/18 45.8 kg     Intake/Output Summary (Last 24 hours) at 01/03/2019 1436 Last data filed at 01/03/2019 1432 Gross per 24 hour  Intake  1000.75 ml  Output 1080 ml  Net -79.25 ml   Physical Exam Patient is examined daily including today on 01/03/2019 , exams remain the same as of yesterday except that has changed   Gen:- Awake Alert, in no acute distress  HEENT:- [place].AT, No sclera icterus Neck-Supple Neck,No JVD,.  Lungs-diminished in bases, no significant adventitious sounds  CV- S1, S2 normal, regular , 3/6 systolic murmur Abd-  +ve B.Sounds, Abd Soft, appropriate postop tenderness, JP drain with serosanguineous drainage Extremity/Skin:- No  edema, pedal pulses present = Psych-affect is appropriate, oriented x3 (episodes of sundowning on intermittent confusion and disorientation) Neuro-generalized weakness , but no new focal deficits, no tremors   Data Review:   Micro Results Recent Results (from the past 240 hour(s))  Blood culture (routine x 2)     Status: None (Preliminary result)   Collection Time: 01/01/19  7:58 PM  Result Value Ref Range Status   Specimen Description   Final    BLOOD LEFT ANTECUBITAL Performed at [facility] [address] [place], [place] [address]    Special Requests   Final    BOTTLES DRAWN AEROBIC AND ANAEROBIC Blood Culture adequate volume Performed at [facility] [address], [place], [place] [address]    Culture   Final    NO GROWTH 2 DAYS Performed at [facility] [address], [place], [place] [address]    Report Status PENDING  Incomplete  Blood culture (routine x 2)     Status: None (Preliminary result)   Collection Time: 01/01/19  7:58 PM  Result Value Ref Range Status   Specimen Description   Final    BLOOD BLOOD LEFT FOREARM Performed at [facility] [address] [place], [place] [address]    Special Requests   Final    BOTTLES DRAWN AEROBIC AND ANAEROBIC Blood Culture adequate volume Performed at [facility] [address] [place], [place] [address]    Culture   Final     NO GROWTH 2 DAYS Performed at [facility]  [address] [place], [place] [address]    Report Status PENDING  Incomplete  Surgical pcr screen     Status: None   Collection Time: 01/02/19  5:57 AM  Result Value Ref Range Status   MRSA, PCR NEGATIVE NEGATIVE Final   Staphylococcus aureus NEGATIVE NEGATIVE Final    Comment: (NOTE) The Xpert SA Assay (FDA approved for NASAL specimens in patients 65 years of age and older), is one component of a comprehensive surveillance program. It is not intended to diagnose infection nor to guide or monitor treatment. Performed at [facility], [place] [address], [place], [place] [address]     Radiology Reports Ct Abdomen Pelvis Wo Contrast  Result Date: 01/01/2019 CLINICAL DATA:  85 year old male with history of left lower quadrant abdominal pain radiating to the back. EXAM: CT ABDOMEN AND PELVIS WITHOUT CONTRAST TECHNIQUE: Multidetector CT imaging of the abdomen and pelvis was performed following the standard protocol without IV contrast. COMPARISON:  CT the abdomen and pelvis 05/07/2018. FINDINGS: Lower chest: Aortic atherosclerosis. Calcified atherosclerotic plaque in the right coronary artery. Calcifications of the mitral annulus. Surgical clips adjacent to the gastroesophageal junction. Hepatobiliary: No definite cystic or solid hepatic lesions are confidently identified on today's noncontrast CT examination. Small calcified gallstones in the gallbladder. No findings to suggest an acute cholecystitis at this time. Pancreas: No definite pancreatic mass or peripancreatic fluid or inflammatory changes are noted on today's noncontrast CT examination. Spleen: Unremarkable. Adrenals/Urinary Tract: There are no abnormal calcifications within the collecting system of either kidney, along the course of either ureter, or within the lumen of the urinary bladder. No hydroureteronephrosis or perinephric stranding to suggest urinary tract  obstruction at this time. The unenhanced appearance of the kidneys is unremarkable bilaterally. Unenhanced appearance of the urinary bladder is normal. Bilateral adrenal glands are normal in appearance. Stomach/Bowel: Unenhanced appearance of the stomach is normal. No pathologic dilatation of small bowel or colon. Numerous colonic diverticulae are noted, without surrounding inflammatory changes to suggest an acute diverticulitis at this time. The appendix is not confidently identified and may be surgically absent. Regardless, there are no inflammatory changes noted adjacent to the cecum to suggest the presence of an acute appendicitis at this time. Vascular/Lymphatic: Aortic atherosclerosis. No lymphadenopathy confidently identified in the abdomen or pelvis on today's noncontrast CT examination. Reproductive: Prostate gland and seminal vesicles are unremarkable in appearance. Other: No significant volume of ascites.  No pneumoperitoneum. Musculoskeletal: There are no aggressive appearing lytic or blastic lesions noted in the visualized portions of the skeleton. IMPRESSION: 1. No acute findings are noted in the abdomen or pelvis to account for the patient's symptoms. 2. Severe colonic diverticulosis without definite surrounding inflammatory changes to indicate an acute diverticulitis at this time. 3. Cholelithiasis without evidence of acute cholecystitis. 4. Aortic atherosclerosis, in addition to least right coronary artery disease. 5. There are calcifications of the mitral annulus. Echocardiographic correlation for evaluation of potential valvular dysfunction may be warranted if clinically indicated. Electronically Signed   By: Vinnie Langton M.D.   On: 01/01/2019 14:36   Dg Chest 2 View  Result Date: 01/01/2019 CLINICAL DATA:  Medical clearance for cholecystitis EXAM: CHEST - 2 VIEW COMPARISON:  03/27/2018 FINDINGS: Emphysematous hyperinflation of the lungs without acute pulmonary consolidation. Normal heart  size and mediastinal contours with aortic atherosclerosis. Vascular clip projects over the base of the neck on the left. 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 IMPRESSION: Emphysematous  hyperinflation of the lungs without acute pulmonary disease. Electronically Signed   By: Ashley Royalty M.D.   On: 01/01/2019 18:03   Dg Chest Port 1 View  Result Date: 01/02/2019 CLINICAL DATA:  85 y/o  M; unresponsiveness. EXAM: PORTABLE CHEST 1 VIEW COMPARISON:  01/01/2019 chest radiograph FINDINGS: Normal cardiac silhouette. Aortic atherosclerosis with calcification. Clear lungs. No pleural effusion or pneumothorax. No acute osseous abnormality is evident. Surgical clips project over the gastroesophageal junction. IMPRESSION: No acute pulmonary process identified. Electronically Signed   By: Kristine Garbe M.D.   On: 01/02/2019 06:11   US Abdomen Limited Ruq  Result Date: 01/01/2019 CLINICAL DATA:  Upper abdominal pain EXAM: ULTRASOUND ABDOMEN LIMITED RIGHT UPPER QUADRANT COMPARISON:  CT from earlier in the same day. FINDINGS: Gallbladder: Gallbladder is well distended with wall thickening to 5.7 mm and edema within the gallbladder wall. Gallstones are noted. Positive sonographic Murphy's sign is noted. Common bile duct: Diameter: 10 mm Liver: Liver is within normal limits with the exception of a 1 cm hyperechoic focus within the left lobe consistent with a small hemangioma. Portal vein is patent on color Doppler imaging with normal direction of blood flow towards the liver. IMPRESSION: Changes consistent with acute calculous cholecystitis in the appropriate clinical setting. Hyperechoic focus within the left lobe of the liver most consistent with small hemangioma. Electronically Signed   By: Inez Catalina M.D.   On: 01/01/2019 16:34     CBC Recent Labs  Lab 01/01/19 1150 01/02/19 0458 01/03/19 1012  WBC 11.2* 19.8* 8.5  HGB  13.0 12.9* 11.3*  HCT 38.5* 38.7* 35.0*  PLT 92* 89* 60*  MCV 97.5 100.5* 104.8*  MCH 32.9 33.5 33.8  MCHC 33.8 33.3 32.3  RDW 13.1 13.7 14.1  LYMPHSABS 0.4*  --  0.6*  MONOABS 1.0  --  0.4  EOSABS 0.0  --  0.0  BASOSABS 0.0  --  0.0    Chemistries  Recent Labs  Lab 01/01/19 1150 01/01/19 2140 01/02/19 1335 01/03/19 1012  NA 142  --  141 138  K 4.7  --  4.9 4.5  CL 108  --  107 105  CO2 23  --  21* 22  GLUCOSE 152*  --  148* 115*  BUN 46*  --  59* 67*  CREATININE 1.75* 1.96* 2.33* 2.70*  CALCIUM 9.4  --  8.6* 8.3*  MG  --   --  1.6* 1.8  AST 20  --  98*  --   ALT 9  --  69*  --   ALKPHOS 90  --  75  --   BILITOT 1.4*  --  0.9  --    ------------------------------------------------------------------------------------------------------------------ No results for input(s): CHOL, HDL, LDLCALC, TRIG, CHOLHDL, LDLDIRECT in the last 72 hours.  Lab Results  Component Value Date   HGBA1C 5.3 10/05/2013   ----------------------------------------------------------------------------------------------------------------- Recent Labs    01/01/19 1957  VITAMINB12 248  FOLATE 10.9  FERRITIN 339*  TIBC 286  IRON 18*  RETICCTPCT 1.3   Cardiac Enzymes Recent Labs  Lab 01/02/19 0514 01/02/19 1335 01/02/19 2000  TROPONINI 0.05* 0.06* 0.05*   Roxan Hockey M.D on 01/03/2019 at 2:36 PM  Go to [email or web address] - for contact info  [facility] - Office  [phone]

## 2019-01-04 DIAGNOSIS — E43 Unspecified severe protein-calorie malnutrition: Secondary | ICD-10-CM

## 2019-01-04 LAB — BASIC METABOLIC PANEL
Anion gap: 10 (ref 5–15)
BUN: 73 mg/dL — ABNORMAL HIGH (ref 8–23)
CHLORIDE: 112 mmol/L — AB (ref 98–111)
CO2: 19 mmol/L — ABNORMAL LOW (ref 22–32)
Calcium: 8.3 mg/dL — ABNORMAL LOW (ref 8.9–10.3)
Creatinine, Ser: 2.79 mg/dL — ABNORMAL HIGH (ref 0.61–1.24)
GFR calc Af Amer: 23 mL/min — ABNORMAL LOW (ref >60)
GFR calc non Af Amer: 20 mL/min — ABNORMAL LOW (ref >60)
GLUCOSE: 96 mg/dL (ref 70–99)
Potassium: 4.6 mmol/L (ref 3.5–5.1)
Sodium: 141 mmol/L (ref 135–145)

## 2019-01-04 MED ORDER — SODIUM CHLORIDE 0.9 % IV SOLN
INTRAVENOUS | Status: DC
  Administered 2019-01-04 – 2019-01-07 (×7): via INTRAVENOUS

## 2019-01-04 NOTE — Progress Notes (Signed)
Calorie Count Note  48 hour calorie count ordered.  Diet: Heart Healthy; SLP saw today and recommends Dysphagia 3, thin liquids Supplements: Ensure Surgery BID, Magic Cup TID  Breakfast (1/13): 302 kcal, 9 grams of protein Lunch (1/13): 557 kcal, 14 grams of protein Dinner (1/12): 133 kcal, 7 grams of protein Supplements: unsure   Total intake: 992 kcal (62% of minimum estimated needs)  30 protein (40% of minimum estimated needs)  Nutrition Dx: Severe Malnutrition related to chronic illness(COPD, CKD, CHF) as evidenced by severe fat depletion, severe muscle depletion, energy intake < or equal to 75% for > or equal to 1 month.  Goal: Patient will meet greater than or equal to 90% of their needs  Intervention:  - Continue Ensure Surgery BID and Magic Cup TID. - Continue to encourage PO intakes.  - Diet per SLP recommendation     Jarome Matin, MS, RD, LDN, [facility] Inpatient Clinical Dietitian Pager # [phone] After hours/weekend pager # [phone]

## 2019-01-04 NOTE — Evaluation (Signed)
Clinical/Bedside Swallow Evaluation Patient Details  Name: Nicholas Porter MRN: [id number] Date of Birth: 1930-01-24  Today's Date: 01/04/2019 Time: SLP Start Time (ACUTE ONLY): 1325 SLP Stop Time (ACUTE ONLY): 1355 SLP Time Calculation (min) (ACUTE ONLY): 30 min  Past Medical History:  Past Medical History:  Diagnosis Date  . Acute metabolic encephalopathy 0/0/7622  . Acute renal failure superimposed on stage 4 chronic kidney disease ([place]) 11/27/2015  . Anemia of chronic renal failure, stage 4 (severe) ([place]) 11/27/2015  . 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 CAD S/P percutaneous coronary angioplasty - DES in RCA with PTCA for ISR; Moderate mLAD, 80% ostial OM2 stable 05/22/2008   Qualifier: Diagnosis of  By: Linda Hedges MD, Heinz Knuckles   . CKD (chronic kidney disease) stage 3, GFR 30-59 ml/min ([place])   . COPD (chronic obstructive pulmonary disease) ([place])   . Essential hypertension 06/30/2007   Qualifier: Diagnosis of  By: Cori Razor RN, Mikal Plane Eye abnormalities    injections in eyes 12/2017  . H/O Inferior STEMI (09/2013) emergent PCI with Promus DES to RCA (3.0 mm  x 28 mm - 3.2 mm) 10/05/2013  . Klebsiella sepsis ([place]) 04/02/2017  . Left-sided carotid artery disease -- s/p CEA 04/06/2006   S/p Left CEA   . Mitral regurgitation - onexam 09/03/2016   Past Surgical History:  Past Surgical History:  Procedure Laterality Date  . AMPUTATION     left index finger -tramatic  . CARDIAC CATHETERIZATION  November 2011   40% mid LAD, 50% proximal RCA, 30-40% distal RCA (DOMINANT RCA with wraparound PDA & 2 large PLBs), 60-70% ostial OM1. No change from 2001. Medical therapy  . CAROTID ENDARTERECTOMY Left 2007   Dr.Hayes: 08/2016 Dopplers: Stable less than 40% right internal carotid stenosis and stable moderate 40-60% left internal carotid stenosis. Patent vertebral and subclavian arteries.  . CARPAL TUNNEL RELEASE     . CHOLECYSTECTOMY N/A 01/02/2019   Procedure: LAPAROSCOPIC CHOLECYSTECTOMY;  Surgeon: Michael Boston, MD;  Location: [facility];  Service: General;  Laterality: N/A;  . CORNEAL TRANSPLANT    . CORONARY ANGIOPLASTY WITH STENT PLACEMENT  09/2013; 05/2014    d) Promus DES 3.0 mm x 28 mm (3.2 mm)  to RCA with STEMI; residual 70% mid-distal LAD, OM 270-80%. Medical management; b) PTCA of 75 % ISR , LAD lesion noted as ~40%  . FOOT SURGERY     left  . HERNIA REPAIR    . LEFT HEART CATHETERIZATION WITH CORONARY ANGIOGRAM N/A 10/05/2013   Procedure: LEFT HEART CATHETERIZATION WITH CORONARY ANGIOGRAM;  Surgeon: Peter M Martinique, MD;  Location: [facility] CATH LAB;  Service: Cardiovascular;  RCA 100% - PCI,CxOM stable, LAD ~70% mid;  . LEFT HEART CATHETERIZATION WITH CORONARY ANGIOGRAM N/A 06/08/2014   Procedure: LEFT HEART CATHETERIZATION WITH CORONARY ANGIOGRAM;  Surgeon: Troy Sine, MD;  Location: [facility] CATH LAB;  Service: Cardiovascular;  UA 6/'15: 75% ISR in RCA - PTCA, LAD lesion ~40%, CxOM stable.    Marland Kitchen NM MYOVIEW LTD  03/2017    - Elevatred Troponin in setting of Sepsis -- DEMAND ISCHEMIA.  Normal perfusion. LVEF 51% with normal wall motion. This is a low risk study.  Marland Kitchen PARTIAL GASTRECTOMY    . PERCUTANEOUS CORONARY INTERVENTION-BALLOON ONLY  06/08/2014   Procedure: PERCUTANEOUS CORONARY INTERVENTION-BALLOON ONLY;  Surgeon: Troy Sine, MD;  Location: [facility] CATH LAB;  Service: Cardiovascular;;  . ROTATOR CUFF  REPAIR     left  . SHOULDER ARTHROSCOPY WITH ROTATOR CUFF REPAIR AND SUBACROMIAL DECOMPRESSION Right 07/01/2013   Procedure: RIGHT SHOULDER ARTHROSCOPY WITH  SUBACROMIAL DECOMPRESSION/DISTAL CLAVICLE RESECTION/ROTATOR CUFF REPAIR ;  Surgeon: Marin Shutter, MD;  Location: [place];  Service: Orthopedics;  Laterality: Right;  . SPINE SURGERY  1985   cervical laminectomy  . TOTAL KNEE ARTHROPLASTY    . TRANSTHORACIC ECHOCARDIOGRAM  09/'17; 4/'18   a) Normal EF 55-60%. Mild to moderate aortic stenosis with  moderate regurgitation. Mild MR.;; b) Mild global reduction in LV systolic function: 40-08%; grade 1 DD - high LVEDP. Mild AS/AI/TR. Mild-Mod MR; moderately elevated PAP.  Marland Kitchen VAGOTOMY     partial gastrectomy   HPI:  85 year old male admitted 01/01/2019 with acute calculuc cholecystitis, HTN, HFrEF, CAD, anorexia, weight loss (60#/1 yr), COPD, emphysema, CKD III. CXR = No acute pulmonary process identified   Assessment / Plan / Recommendation Clinical Impression  Pt seen at bedside for assessment of swallow function and safety. Pt is edentulous. Dentures are at home. However, pt has lost 60# over the last year, so his dentures may be ill-fitting. Pt exhibits extended oral prep of hard solids, but no obvious oral deficits observed or reported. No overt s/s aspiration observed on any consistency tested. Given lack of dentition and current deconditioning, recommend downgrading diet to mechanical soft for energy conservation. SLP will continue to follow to assess diet tolerance and provide education.     SLP Visit Diagnosis: Dysphagia, unspecified (R13.10)    Aspiration Risk  Mild aspiration risk    Diet Recommendation Dysphagia 3 (Mech soft);Thin liquid   Liquid Administration via: Cup;Straw Medication Administration: Crushed with puree(if pt has difficulty with large pills) Supervision: Staff to assist with self feeding;Full supervision/cueing for compensatory strategies Compensations: Minimize environmental distractions;Slow rate;Small sips/bites Postural Changes: Seated upright at 90 degrees    Other  Recommendations Oral Care Recommendations: Oral care BID   Follow up Recommendations 24 hour supervision/assistance      Frequency and Duration min 1 x/week  1 week;2 weeks       Prognosis Prognosis for Safe Diet Advancement: Fair      Swallow Study   General Date of Onset: 01/01/19 HPI: 85 year old male admitted 01/01/2019 with acute calculuc cholecystitis, HTN, HFrEF, CAD,  anorexia, weight loss (60#/1 yr), COPD, emphysema, CKD III. CXR = No acute pulmonary process identified Type of Study: Bedside Swallow Evaluation Previous Swallow Assessment: none Diet Prior to this Study: Regular;Thin liquids Temperature Spikes Noted: No Respiratory Status: Nasal cannula History of Recent Intubation: No Behavior/Cognition: Alert;Pleasant mood;Cooperative;Confused Oral Cavity Assessment: Within Functional Limits Oral Care Completed by SLP: No Oral Cavity - Dentition: Edentulous Vision: Functional for self-feeding Self-Feeding Abilities: Able to feed self Patient Positioning: Upright in bed Baseline Vocal Quality: Normal Volitional Cough: Weak Volitional Swallow: Able to elicit    Oral/Motor/Sensory Function Overall Oral Motor/Sensory Function: Within functional limits   Ice Chips Ice chips: Not tested   Thin Liquid Thin Liquid: Within functional limits Presentation: Straw    Nectar Thick Nectar Thick Liquid: Not tested   Honey Thick Honey Thick Liquid: Not tested   Puree Puree: Within functional limits Presentation: Spoon   Solid     Solid: Impaired Presentation: Self Fed Oral Phase Impairments: Other (comment)(edentulous - extended oral prep)     Chole Driver B. Quentin Ore, [facility], [place] Speech Language Pathologist [phone]  Shonna Chock 01/04/2019,1:59 PM

## 2019-01-04 NOTE — Evaluation (Signed)
Occupational Therapy Evaluation Patient Details Name: Nicholas Porter MRN: [id number] DOB: March 19, 1930 Today's Date: 01/04/2019    History of Present Illness Pt is an 85 year old male s/p Acute gangrenous cholecystitis s/p lap cholecystectomy 01/02/2019 with PMHx significant for CAD with prior angioplasty and stenting, PAD with prior left carotid endodiathermy, inferior STEMI, COPD   Clinical Impression   Pt admitted with the above. Pt currently with functional limitations due to the deficits listed below (see OT Problem List).  Pt will benefit from skilled OT to increase their safety and independence with ADL and functional mobility for ADL to facilitate discharge to venue listed below. Pt needs significant A and wife will not be able to A pt at this level- will need ST SNF     Follow Up Recommendations  SNF    Equipment Recommendations  None recommended by OT    Recommendations for Other Services       Precautions / Restrictions Precautions Precautions: Fall Precaution Comments: currently on oxygen (does not wear at baseline), JP drain      Mobility Bed Mobility               General bed mobility comments: Pt in chair  Transfers Overall transfer level: Needs assistance Equipment used: Rolling walker (2 wheeled) Transfers: Sit to/from [facility] Sit to Stand: Mod assist Stand pivot transfers: Mod assist       General transfer comment: verbal cues for hand placement, assist to rise and steady, remained on oxygen    Balance Overall balance assessment: Needs assistance         Standing balance support: Bilateral upper extremity supported Standing balance-Leahy Scale: Poor Standing balance comment: requiring UE support at this time                           ADL either performed or assessed with clinical judgement   ADL Overall ADL's : Needs assistance/impaired Eating/Feeding: Minimal assistance;Sitting   Grooming: Minimal  assistance;Sitting   Upper Body Bathing: Minimal assistance;Sitting   Lower Body Bathing: Maximal assistance;Sit to/from stand;Cueing for sequencing;Cueing for safety   Upper Body Dressing : Sitting;Minimal assistance   Lower Body Dressing: Maximal assistance;Sit to/from stand;Cueing for sequencing;Cueing for safety   Toilet Transfer: Moderate assistance;Stand-pivot;BSC   Toileting- Clothing Manipulation and Hygiene: Maximal assistance;Sit to/from stand;Cueing for sequencing;Cueing for safety               Vision Patient Visual Report: No change from baseline              Pertinent Vitals/Pain Pain Assessment: No/denies pain Pain Score: 3  Faces Pain Scale: Hurts a little bit Pain Location: abdomen with movement Pain Descriptors / Indicators: Aching;Sore Pain Intervention(s): Limited activity within patient's tolerance;Repositioned     Hand Dominance     Extremity/Trunk Assessment Upper Extremity Assessment Upper Extremity Assessment: Generalized weakness           Communication Communication Communication: HOH   Cognition Arousal/Alertness: Awake/alert Behavior During Therapy: WFL for tasks assessed/performed Overall Cognitive Status: Within Functional Limits for tasks assessed                                                Home Living Family/patient expects to be discharged to:: Private residence Living Arrangements: Spouse/significant other Available Help at Discharge: Family Type  of Home: House Home Access: Stairs to enter [employer] of Steps: 2   Home Layout: One level               Home Equipment: [place] - 2 wheels;Cane - single point          Prior Functioning/Environment Level of Independence: Independent        Comments: reports he likes doing yardwork outside        OT Problem List: Decreased strength;Decreased activity tolerance;Decreased safety awareness;Decreased knowledge of use of DME or  AE;Impaired balance (sitting and/or standing)      OT Treatment/Interventions: Self-care/ADL training;Patient/family education;Therapeutic activities;DME and/or AE instruction;Therapeutic exercise    OT Goals(Current goals can be found in the care plan section)    OT Frequency: Min 2X/week   Barriers to D/C: Decreased caregiver support          Co-evaluation              AM-PAC OT "6 Clicks" Daily Activity     Outcome Measure Help from another person eating meals?: A Little Help from another person taking care of personal grooming?: A Little Help from another person toileting, which includes using toliet, bedpan, or urinal?: A Lot Help from another person bathing (including washing, rinsing, drying)?: A Little Help from another person to put on and taking off regular upper body clothing?: A Little Help from another person to put on and taking off regular lower body clothing?: A Lot 6 Click Score: 16   End of Session Equipment Utilized During Treatment: Rolling walker;Gait belt Nurse Communication: Mobility status  Activity Tolerance: Patient tolerated treatment well Patient left: in chair;with call bell/phone within reach;with chair alarm set;with family/visitor present  OT Visit Diagnosis: Unsteadiness on feet (R26.81);Other abnormalities of gait and mobility (R26.89);Muscle weakness (generalized) (M62.81)                Time: 2549-8264 OT Time Calculation (min): 23 min Charges:  OT General Charges $OT Visit: 1 Visit OT Evaluation $OT Eval Moderate Complexity: 1 Mod  Kari Baars, [place] [phone] Office- [phone], Edwena Felty D 01/04/2019, 5:49 PM

## 2019-01-04 NOTE — Progress Notes (Signed)
Pharmacy Antibiotic Note  Nicholas Porter is a 85 y.o. male with a h/o CKD IV admitted on 01/01/2019 with acute cholecystitis s/p lap chole 1/11.  Pharmacy has been consulted for Zosyn dosing x 5 days postoperatively.  Plan: Zosyn 2.25 g 30-min infusion q 8 hours x 15 doses (last 1/16 at [facility]). No dose adjustments needed, Pharmacy will sign off  Height: 5\' 7"  (170.2 cm) Weight: 95 lb 10.9 oz (43.4 kg) IBW/kg (Calculated) : 66.1  Temp (24hrs), Avg:98.4 F (36.9 C), Min:98.2 F (36.8 C), Max:98.5 F (36.9 C)  Recent Labs  Lab 01/01/19 1150 01/01/19 2140 01/02/19 0458 01/02/19 1335 01/03/19 1012 01/04/19 0539  WBC 11.2*  --  19.8*  --  8.5  --   CREATININE 1.75* 1.96*  --  2.33* 2.70* 2.79*    Estimated Creatinine Clearance: 11.7 mL/min (A) (by C-G formula based on SCr of 2.79 mg/dL (H)).    Allergies  Allergen Reactions  . Amlodipine Besylate Hives  . Hydralazine Itching  . Lipitor [Atorvastatin] Itching  . Naproxen Diarrhea  . Oxycodone-Acetaminophen Itching  . Isosorbide Rash   Microbiology: 1/10 BCx: ngtd 1/11 MRSA PCR: neg  Thank you for allowing pharmacy to be a part of this patient's care.  Peggyann Juba, PharmD, BCPS Pager: [phone] 01/04/2019 8:39 AM

## 2019-01-04 NOTE — Discharge Instructions (Signed)
LAPAROSCOPIC SURGERY: POST OP INSTRUCTIONS ° °###################################################################### ° °EAT °Gradually transition to a high fiber diet with a fiber supplement over the next few weeks after discharge.  Start with a pureed / full liquid diet (see below) ° °WALK °Walk an hour a day.  Control your pain to do that.   ° °CONTROL PAIN °Control pain so that you can walk, sleep, tolerate sneezing/coughing, go up/down stairs. ° °HAVE A BOWEL MOVEMENT DAILY °Keep your bowels regular to avoid problems.  OK to try a laxative to override constipation.  OK to use an antidairrheal to slow down diarrhea.  Call if not better after 2 tries ° °CALL IF YOU HAVE PROBLEMS/CONCERNS °Call if you are still struggling despite following these instructions. °Call if you have concerns not answered by these instructions ° °###################################################################### ° ° ° °1. DIET: Follow a light bland diet the first 24 hours after arrival home, such as soup, liquids, crackers, etc.  Be sure to include lots of fluids daily.  Avoid fast food or heavy meals as your are more likely to get nauseated.  Eat a low fat the next few days after surgery.   ° °2. Take your usually prescribed home medications unless otherwise directed. ° °3. PAIN CONTROL: °a. Pain is best controlled by a usual combination of three different methods TOGETHER: °i. Ice/Heat °ii. Over the counter pain medication °iii. Prescription pain medication °b. Most patients will experience some swelling and bruising around the incisions.  Ice packs or heating pads (30-60 minutes up to 6 times a day) will help. Use ice for the first few days to help decrease swelling and bruising, then switch to heat to help relax tight/sore spots and speed recovery.  Some people prefer to use ice alone, heat alone, alternating between ice & heat.  Experiment to what works for you.  Swelling and bruising can take several weeks to resolve.   °c. It  is helpful to take an over-the-counter pain medication regularly for the first few weeks.  Choose one of the following that works best for you: °i. Naproxen (Aleve, etc)  Two 220mg tabs twice a day °ii. Ibuprofen (Advil, etc) Three 200mg tabs four times a day (every meal & bedtime) °iii. Acetaminophen (Tylenol, etc) 500-650mg four times a day (every meal & bedtime) °d. A  prescription for pain medication (such as oxycodone, hydrocodone, tramadol, gabapentin, methocarbamol, etc) should be given to you upon discharge.  Take your pain medication as prescribed.  °i. If you are having problems/concerns with the prescription medicine (does not control pain, nausea, vomiting, rash, itching, etc), please call us [phone] to see if we need to switch you to a different pain medicine that will work better for you and/or control your side effect better. °ii. If you need a refill on your pain medication, please give us 48 hour notice.  contact your pharmacy.  They will contact our office to request authorization. Prescriptions will not be filled after 5 pm or on week-ends ° °4. Avoid getting constipated.   °a. Between the surgery and the pain medications, it is common to experience some constipation.   °b. Increasing fluid intake and taking a fiber supplement (such as Metamucil, Citrucel, FiberCon, MiraLax, etc) 1-2 times a day regularly will usually help prevent this problem from occurring.   °c. A mild laxative (prune juice, Milk of Magnesia, MiraLax, etc) should be taken according to package directions if there are no bowel movements after 48 hours.   °5. Watch out for   diarrhea.   a. If you have many loose bowel movements, simplify your diet to bland foods & liquids for a few days.   b. Stop any stool softeners and decrease your fiber supplement.   c. Switching to mild anti-diarrheal medications (Kayopectate, Pepto Bismol) can help.   d. If this worsens or does not improve, please call us.  6. Wash / shower every  day.  You may shower over the dressings as they are waterproof.  Continue to shower over incision(s) after the dressing is off.  7. Remove your waterproof bandages 5 days after surgery.  You may leave the incision open to air.  You may replace a dressing/Band-Aid to cover the incision for comfort if you wish.   8. ACTIVITIES as tolerated:   a. You may resume regular (light) daily activities beginning the next day--such as daily self-care, walking, climbing stairs--gradually increasing activities as tolerated.  If you can walk 30 minutes without difficulty, it is safe to try more intense activity such as jogging, treadmill, bicycling, low-impact aerobics, swimming, etc. b. Save the most intensive and strenuous activity for last such as sit-ups, heavy lifting, contact sports, etc  Refrain from any heavy lifting or straining until you are off narcotics for pain control.   c. DO NOT PUSH THROUGH PAIN.  Let pain be your guide: If it hurts to do something, don't do it.  Pain is your body warning you to avoid that activity for another week until the pain goes down. d. You may drive when you are no longer taking prescription pain medication, you can comfortably wear a seatbelt, and you can safely maneuver your car and apply brakes. e. Dennis Bast may have sexual intercourse when it is comfortable.  9. FOLLOW UP in our office a. Please call [facility] at [phone] to set up an appointment to see your surgeon in the office for a follow-up appointment approximately 2-3 weeks after your surgery. b. Make sure that you call for this appointment the day you arrive home to insure a convenient appointment time.  10. IF YOU HAVE DISABILITY OR FAMILY LEAVE FORMS, BRING THEM TO THE OFFICE FOR PROCESSING.  DO NOT GIVE THEM TO YOUR DOCTOR.   WHEN TO CALL us [phone]: 1. Poor pain control 2. Reactions / problems with new medications (rash/itching, nausea, etc)  3. Fever over 101.5 F (38.5 C) 4. Inability to  urinate 5. Nausea and/or vomiting 6. Worsening swelling or bruising 7. Continued bleeding from incision. 8. Increased pain, redness, or drainage from the incision   The clinic staff is available to answer your questions during regular business hours (8:30am-5pm).  Please dont hesitate to call and ask to speak to one of our nurses for clinical concerns.   If you have a medical emergency, go to the nearest emergency room or call 911.  A surgeon from [facility] is always on call at the [facility], [place], [place], [place], [place]  [address] ? MAIN: [phone] ? TOLL FREE: [phone] ?  FAX [phone] [email or web address]     Cholecystitis  Cholecystitis is irritation and swelling (inflammation) of the gallbladder. The gallbladder is an organ that is shaped like a pear. It is under the liver on the right side of the body. This organ stores bile. Bile helps the body break down (digest) the fats in food. This condition can occur all of a sudden. It needs to be treated. What are the causes?  This condition may be caused by stones or lumps that form in the gallbladder (gallstones). Gallstones can block the tube (duct) that carries bile out of your gallbladder. Other causes are:  Damage to the gallbladder due to less blood flow.  Germs in the bile ducts.  Scars or kinks in the bile ducts.  Abnormal growths (tumors) in the liver, pancreas, or gallbladder. What increases the risk? You are more likely to develop this condition if:  You have sickle cell disease.  You take birth control pills.  You use estrogen.  You have alcoholic liver disease.  You have liver cirrhosis.  You are being fed through a vein.  You are very ill.  You do not eat or drink for a long time. This is also called "fasting."  You are overweight (obese).  You lose weight too fast.  You are pregnant.  You have high  levels of fat in the blood (triglycerides).  You have irritation and swelling of the pancreas (pancreatitis). What are the signs or symptoms? Symptoms of this condition include:  Pain in the belly (abdomen). Pain is often in the upper right area of the belly.  Tenderness or bloating in the belly.  Feeling sick to your stomach (nauseous).  Throwing up (vomiting).  Fever.  Chills. How is this diagnosed? This condition may be diagnosed with a medical history and exam. You may also have other tests, such as:  Imaging tests. This may include: ? Ultrasound. ? CT scan of the belly. ? Nuclear scan. This is also called a HIDA scan. This scan lets your doctor see the bile as it moves in your body. ? MRI.  Blood tests. These are done to check: ? Your blood count. The white blood cell count may be higher than normal. ? How well your liver works. How is this treated? This condition may be treated with:  Surgery to take out your gallbladder.  Antibiotic medicines to treat illnesses caused by germs.  Going without food for some time.  Giving fluids through an IV tube.  Medicines to treat pain or throwing up. Follow these instructions at home:  If you had surgery, follow instructions from your doctor about how to care for yourself after you go home. Medicines   Take over-the-counter and prescription medicines only as told by your doctor.  If you were prescribed an antibiotic medicine, take it as told by your doctor. Do not stop taking it even if you start to feel better. General instructions  Follow instructions from your doctor about what to eat or drink. Do not eat or drink anything that makes you sick again.  Do not lift anything that is heavier than 10 lb (4.5 kg) until your doctor says that it is safe.  Do not use any products that contain nicotine or tobacco, such as cigarettes and e-cigarettes. If you need help quitting, ask your doctor.  Keep all follow-up visits as  told by your doctor. This is important. Contact a doctor if:  You have pain and your medicine does not help.  You have a fever. Get help right away if:  Your pain moves to: ? Another part of your belly. ? Your back.  Your symptoms do not go away.  You have new symptoms. Summary  Cholecystitis is swelling and irritation of the gallbladder.  This condition may be caused by stones or lumps that form in the gallbladder (gallstones).  Common symptoms are pain in the belly. You may feel sick to your  stomach and start throwing up. You may also have a fever and chills.  This condition may be treated with surgery to take out the gallbladder. It may also be treated with medicines, fasting, and fluids through an IV tube.  Follow what you are told about eating and drinking. Do not eat things that make you sick again. This information is not intended to replace advice given to you by your health care provider. Make sure you discuss any questions you have with your health care provider. Document Released: 11/28/2011 Document Revised: 04/17/2018 Document Reviewed: 04/17/2018 Elsevier Interactive Patient Education  2019 [place] Record volume of drainage, and color of drainage daily.  Call if color changes. Bring record with you to the office.  Surgical drains are used to remove extra fluid that normally builds up in a surgical wound after surgery. A surgical drain helps to heal a surgical wound. Different kinds of surgical drains include:  Active drains. These drains use suction to pull drainage away from the surgical wound. Drainage flows through a tube to a container outside of the body. It is important to keep the bulb or the drainage container flat (compressed) at all times, except while you empty it. Flattening the bulb or container creates suction. The two most common types of active drains are bulb drains and Hemovac drains.  Passive drains. These  drains allow fluid to drain naturally, by gravity. Drainage flows through a tube to a bandage (dressing) or a container outside of the body. Passive drains do not need to be emptied. The most common type of passive drain is the Penrose drain. A drain is placed during surgery. Immediately after surgery, drainage is usually bright red and a little thicker than water. The drainage may gradually turn yellow or pink and become thinner. It is likely that your health care provider will remove the drain when the drainage stops or when the amount decreases to 1-2 Tbsp (15-30 mL) during a 24-hour period. How to care for your surgical drain It is important to care for your drain to prevent infection. If your drain is placed at your back, or any other hard-to-reach area, ask another person to assist you in performing the following steps:  Keep the skin around the drain dry and covered with a dressing at all times.  Check your drain area every day for signs of infection. Check for: ? More redness, swelling, or pain. ? Pus or a bad smell. ? Cloudy drainage. Follow instructions from your health care provider about how to take care of your drain and how to change your dressing. Change your dressing at least one time every day. Change it more often if needed to keep the dressing dry. Make sure you: 1. Gather your supplies, including: ? Tape. ? Germ-free cleaning solution (sterile saline). ? Split gauze drain sponge: 4 x 4 inches (10 x 10 cm). ? Gauze square: 4 x 4 inches (10 x 10 cm). 2. Wash your hands with soap and water before you change your dressing. If soap and water are not available, use hand sanitizer. 3. Remove the old dressing. Avoid using scissors to do that. 4. Use sterile saline to clean your skin around the drain. 5. Place the tube through the slit in a drain sponge. Place the drain sponge so that it covers your wound. 6. Place the gauze square or another drain sponge on top of the drain sponge  that is on the wound. Make  sure the tube is between those layers. 7. Tape the dressing to your skin. 8. If you have an active bulb or Hemovac drain, tape the drainage tube to your skin 1-2 inches (2.5-5 cm) below the place where the tube enters your body. Taping keeps the tube from pulling on any stitches (sutures) that you have. 9. Wash your hands with soap and water. 10. Write down the color of your drainage and how often you change your dressing. How to empty your active bulb or Hemovac drain  1. Make sure that you have a measuring cup that you can empty your drainage into. 2. Wash your hands with soap and water. If soap and water are not available, use hand sanitizer. 3. Gently move your fingers down the tube while squeezing very lightly. This is called stripping the tube. This clears any drainage, clots, or tissue from the tube. ? Do not pull on the tube. ? You may need to strip the tube several times every day to keep the tube clear. 4. Open the bulb cap or the drain plug. Do not touch the inside of the cap or the bottom of the plug. 5. Empty all of the drainage into the measuring cup. 6. Compress the bulb or the container and replace the cap or the plug. To compress the bulb or the container, squeeze it firmly in the middle while you close the cap or plug the container. 7. Write down the amount of drainage that you have in each 24-hour period. If you have less than 2 Tbsp (30 mL) of drainage during 24 hours, contact your health care provider. 8. Flush the drainage down the toilet. 9. Wash your hands with soap and water. Contact a health care provider if:  You have more redness, swelling, or pain around your drain area.  The amount of drainage that you have is increasing instead of decreasing.  You have pus or a bad smell coming from your drain area.  You have a fever.  You have drainage that is cloudy.  There is a sudden stop or a sudden decrease in the amount of drainage that  you have.  Your tube falls out.  Your active draindoes not stay compressedafter you empty it. Summary  Surgical drains are used to remove extra fluid that normally builds up in a surgical wound after surgery.  Different kinds of surgical drains include active drains and passive drains. Active drains use suction to pull drainage away from the surgical wound, and passive drains allow fluid to drain naturally.  It is important to care for your drain to prevent infection. If your drain is placed at your back, or any other hard-to-reach area, ask another person to assist you.  Contact your health care provider if you have more redness, swelling, or pain around your drain area. This information is not intended to replace advice given to you by your health care provider. Make sure you discuss any questions you have with your health care provider. Document Released: 12/06/2000 Document Revised: 01/01/2018 Document Reviewed: 06/28/2015 Elsevier Interactive Patient Education  2019 [employer].

## 2019-01-04 NOTE — Progress Notes (Signed)
Patient Demographics:    Nicholas Porter, is a 85 y.o. male, DOB - 04/09/1930, PPJ:[id number]  Admit date - 01/01/2019   Admitting Physician Aven Cegielski Denton Brick, MD  Outpatient Primary MD for the patient is Dorothyann Peng, NP  LOS - 3   Chief Complaint  Patient presents with  . Abdominal Pain  . Nausea        Subjective:    Nicholas Porter today has no fevers, no emesis,  No chest pain, oral intake remains poor, wife at bedside, wife states patient also gets agitated at home at times, oral intake is not great  Assessment  & Plan :    Principal Problem:   Acute gangrenous cholecystitis s/p lap cholecystectomy 01/02/2019 Active Problems:   Essential hypertension   CAD S/P percutaneous coronary angioplasty - DES in RCA with PTCA for ISR; Moderate mLAD, 80% ostial OM2 stable   H/O Inferior STEMI (09/2013) emergent PCI with Promus DES to RCA (3.0 mm  x 28 mm - 3.2 mm)   GERD (gastroesophageal reflux disease)   Malnutrition of moderate degree   Benign prostatic hyperplasia   Unintentional weight loss   History of medication noncompliance   History of partial gastrectomy   Pill dysphagia   CKD (chronic kidney disease), stage III (HCC)   Protein-calorie malnutrition, severe  Brief Summary 85 y.o. male has medical history relevant for CAD with prior angioplasty and stenting, PAD with prior left carotid endodiathermy, COPD/ patient is a reformed smoker who quit smoking about 50 years ago as well as h/o partial gastrectomy/vagatomy in distant past for ulcer. LAst EGD 2009 with mild gastritis ,CKDIII , unintentional weight loss 60 lbs last year admitted on 01/01/2019 with abdominal pain and found to have calculus cholecystitis, status post lap chole and lysis of adhesions on 01/02/2019 by Dr. Johney Maine, patient subsequently developed AKI,    Plan:- 1)Acute calculus cholecystitis-----leukocytosis has resolved,  hemoglobin is down to 8.5 from 19.8, general surgeon recommends total of 5 days of IV antibiotics, s/p  lap chole with lysis of adhesions on 01/02/2019 , surgical consult from Dr. Johney Maine appreciated,, -  (patient received IV Rocephin and Flagyl on 01/01/2019)----further management per surgical team, PRN pain meds PRN antiemetics  2)HTN--stage II, stable,  continue. Imdur 30 mg daily along with hydralazine 25 mg 3 times daily and metoprolol 25 mg twice daily,   may use IV labetalol when necessary  Every 4 hours for systolic blood pressure over 160 mmhg  3)Social/Ethics--- Patient and his wife both admit that he is a DNR, Health visitor verified DNR status  4)HFpEF--- patient with history of combined systolic and diastolic dysfunction CHF, repeat echo with preserved EF of 55 to 60% with grade 1 dCHF in the setting of known CAD and possible ischemic cardiomyopathy---   be judicious with IV fluids to avoid volume overload, continue metoprolol, isosorbide/hydralazine combo, , QTC on EKG is normalized  5)H/o CAD and PAD-- CAD with prior angioplasty and stenting, PAD with prior left carotid endarterecmy , patient apparently is intolerant to Lipitor trial low-dose pravastatin, continue aspirin ----continue metoprolol, no ACS type symptoms at this time, EKG without ischemic changes, repeat echo with preserved EF of 55 to 60% with grade 1 dCHF  6)AKI----acute kidney injury on CKD stage -  III due to dehydration in the setting of persistent nausea and abdominal pain with poor oral intake poor oral intake preop and postop,    creatinine on admission= 1.75,  ,   baseline creatinine = 1.9  (04/2018)   , creatinine is now= 2.79     , renally adjust medications, avoid nephrotoxic agents/dehydration/hypotension , continue to hydrate  7)Anorexia and Weight Loss----consider GI consult for endoluminal evaluation (last colonoscopy apparently about 7 to 10 years ago), continue Remeron for appetite stimulation and sleep, nutritional  consult appreciated, TSH was 2.4 in April 2019  8) COPD/emphysema----quit smoking about 50 years ago, may use PRN albuterol, no acute flareup of COPD at this time  9)Generalized weakness/debility--- PT eval appreciated, recommend SNF rehab  Disposition/Need for in-Hospital Stay- patient unable to be discharged at this time due to status post lap chole lysis evaluation 01/02/2019, patient needs IV antibiotics given significant gangrenous cholecystitis, also needs IV fluids for AKI, awaiting SNF rehab placement pending insurance approval  Code Status : DNR  Family Communication:   wife  Disposition Plan  : SNF Rehab Consults  :  Gen surg  DVT Prophylaxis  :   SCD (low platelets  Lab Results  Component Value Date   PLT 60 (L) 01/03/2019    Inpatient Medications  Scheduled Meds: . acetaminophen  1,000 mg Oral TID  . aspirin EC  81 mg Oral Daily  . brinzolamide  1 drop Left Eye Q12H  . bupivacaine liposome  20 mL Infiltration Once  . calcium-vitamin D  1 tablet Oral QODAY  . Chlorhexidine Gluconate Cloth  6 each Topical Once  . feeding supplement  237 mL Oral BID BM  . gabapentin  300 mg Oral QHS  . hydrALAZINE  25 mg Oral TID  . isosorbide mononitrate  30 mg Oral Daily  . latanoprost  1 drop Both Eyes QHS  . lip balm  1 application Topical BID  . metoprolol tartrate  25 mg Oral BID  . mirtazapine  7.5 mg Oral QHS  . polyethylene glycol  17 g Oral BID  . pravastatin  20 mg Oral q1800   Continuous Infusions: . sodium chloride 100 mL/hr at 01/04/19 1600  . methocarbamol (ROBAXIN) IV    . ondansetron (ZOFRAN) IV    . piperacillin-tazobactam Stopped (01/04/19 1337)   PRN Meds:.albuterol, alum & mag hydroxide-simeth, bisacodyl, fentaNYL (SUBLIMAZE) injection, guaiFENesin-dextromethorphan, hydrocortisone, hydrocortisone cream, labetalol, magic mouthwash, magnesium hydroxide, menthol-cetylpyridinium, methocarbamol (ROBAXIN) IV, ondansetron (ZOFRAN) IV **OR** ondansetron (ZOFRAN)  IV, phenol, polyethylene glycol, prochlorperazine, traMADol, traZODone    Anti-infectives (From admission, onward)   Start     Dose/Rate Route Frequency Ordered Stop   01/02/19 1400  piperacillin-tazobactam (ZOSYN) IVPB 2.25 g  Status:  Discontinued     2.25 g 100 mL/hr over 30 Minutes Intravenous Every 8 hours 01/02/19 1223 01/02/19 1224   01/02/19 1400  piperacillin-tazobactam (ZOSYN) IVPB 2.25 g     2.25 g 100 mL/hr over 30 Minutes Intravenous Every 8 hours 01/02/19 1224 01/07/19 1359   01/02/19 0909  ceFAZolin (ANCEF) 2-4 GM/100ML-% IVPB  Status:  Discontinued    Note to Pharmacy:  Guerry Bruin   : cabinet override      01/02/19 0909 01/02/19 0916   01/02/19 0600  ceFAZolin (ANCEF) IVPB 2g/100 mL premix  Status:  Discontinued     2 g 200 mL/hr over 30 Minutes Intravenous On call to O.R. 01/01/19 1909 01/02/19 1218   01/02/19 0600  metroNIDAZOLE (FLAGYL) IVPB 500 mg  500 mg 100 mL/hr over 60 Minutes Intravenous On call to O.R. 01/01/19 1909 01/02/19 0836   01/01/19 1730  cefTRIAXone (ROCEPHIN) 2 g in sodium chloride 0.9 % 100 mL IVPB  Status:  Discontinued     2 g 200 mL/hr over 30 Minutes Intravenous  Once 01/01/19 1728 01/02/19 1218        Objective:   Vitals:   01/03/19 2254 01/04/19 0607 01/04/19 1055 01/04/19 1253  BP: 121/60 138/66 (!) 157/65 (!) 153/83  Pulse:  72 72 66  Resp:  16  (!) 22  Temp:  98.4 F (36.9 C)  97.9 F (36.6 C)  TempSrc:  Oral  Oral  SpO2:  100%  100%  Weight:      Height:        Wt Readings from Last 3 Encounters:  01/01/19 43.4 kg  10/21/18 45.8 kg  10/08/18 45.8 kg     Intake/Output Summary (Last 24 hours) at 01/04/2019 1842 Last data filed at 01/04/2019 1835 Gross per 24 hour  Intake 1695.3 ml  Output 1610 ml  Net 85.3 ml   Physical Exam Patient is examined daily including today on 01/04/2019 , exams remain the same as of yesterday except that has changed   Gen:- Awake Alert, in no acute distress  HEENT:- [place].AT, No  sclera icterus Neck-Supple Neck,No JVD,.  Lungs-diminished in bases, no significant adventitious sounds  CV- S1, S2 normal, regular, 3/6 systolic murmur Abd-  +ve B.Sounds, Abd Soft, appropriate postop tenderness, JP drain with serosanguineous drainage Extremity/Skin:- No  edema, pedal pulses present = Psych-affect is appropriate, oriented x3 (episodes of sundowning on intermittent confusion and disorientation) Neuro-generalized weakness , but no new focal deficits, no tremors   Data Review:   Micro Results Recent Results (from the past 240 hour(s))  Blood culture (routine x 2)     Status: None (Preliminary result)   Collection Time: 01/01/19  7:58 PM  Result Value Ref Range Status   Specimen Description   Final    BLOOD LEFT ANTECUBITAL Performed at [facility] [address] [place], [place] [address]    Special Requests   Final    BOTTLES DRAWN AEROBIC AND ANAEROBIC Blood Culture adequate volume Performed at [facility] [address] [place], [place] [address]    Culture   Final    NO GROWTH 3 DAYS Performed at [facility], [place] [address] [place], [place] [address]    Report Status PENDING  Incomplete  Blood culture (routine x 2)     Status: None (Preliminary result)   Collection Time: 01/01/19  7:58 PM  Result Value Ref Range Status   Specimen Description BLOOD LEFT FOREARM  Final   Special Requests   Final    BOTTLES DRAWN AEROBIC AND ANAEROBIC Blood Culture adequate volume Performed at [facility] [address] [place], [place] [address]    Culture   Final    NO GROWTH 3 DAYS Performed at [facility], [place] [address], [place], [place] [address]    Report Status PENDING  Incomplete  Surgical pcr screen     Status: None   Collection Time: 01/02/19  5:57 AM  Result Value Ref Range Status   MRSA, PCR NEGATIVE NEGATIVE Final   Staphylococcus aureus NEGATIVE NEGATIVE Final    Comment:  (NOTE) The Xpert SA Assay (FDA approved for NASAL specimens in patients 72 years of age and older), is one component of a  comprehensive surveillance program. It is not intended to diagnose infection nor to guide or monitor treatment. Performed at [facility], [place] [address], [place], [place] [address]    Radiology Reports Ct Abdomen Pelvis Wo Contrast  Result Date: 01/01/2019 CLINICAL DATA:  85 year old male with history of left lower quadrant abdominal pain radiating to the back. EXAM: CT ABDOMEN AND PELVIS WITHOUT CONTRAST TECHNIQUE: Multidetector CT imaging of the abdomen and pelvis was performed following the standard protocol without IV contrast. COMPARISON:  CT the abdomen and pelvis 05/07/2018. FINDINGS: Lower chest: Aortic atherosclerosis. Calcified atherosclerotic plaque in the right coronary artery. Calcifications of the mitral annulus. Surgical clips adjacent to the gastroesophageal junction. Hepatobiliary: No definite cystic or solid hepatic lesions are confidently identified on today's noncontrast CT examination. Small calcified gallstones in the gallbladder. No findings to suggest an acute cholecystitis at this time. Pancreas: No definite pancreatic mass or peripancreatic fluid or inflammatory changes are noted on today's noncontrast CT examination. Spleen: Unremarkable. Adrenals/Urinary Tract: There are no abnormal calcifications within the collecting system of either kidney, along the course of either ureter, or within the lumen of the urinary bladder. No hydroureteronephrosis or perinephric stranding to suggest urinary tract obstruction at this time. The unenhanced appearance of the kidneys is unremarkable bilaterally. Unenhanced appearance of the urinary bladder is normal. Bilateral adrenal glands are normal in appearance. Stomach/Bowel: Unenhanced appearance of the stomach is normal. No pathologic dilatation of small bowel or colon. Numerous colonic diverticulae are  noted, without surrounding inflammatory changes to suggest an acute diverticulitis at this time. The appendix is not confidently identified and may be surgically absent. Regardless, there are no inflammatory changes noted adjacent to the cecum to suggest the presence of an acute appendicitis at this time. Vascular/Lymphatic: Aortic atherosclerosis. No lymphadenopathy confidently identified in the abdomen or pelvis on today's noncontrast CT examination. Reproductive: Prostate gland and seminal vesicles are unremarkable in appearance. Other: No significant volume of ascites.  No pneumoperitoneum. Musculoskeletal: There are no aggressive appearing lytic or blastic lesions noted in the visualized portions of the skeleton. IMPRESSION: 1. No acute findings are noted in the abdomen or pelvis to account for the patient's symptoms. 2. Severe colonic diverticulosis without definite surrounding inflammatory changes to indicate an acute diverticulitis at this time. 3. Cholelithiasis without evidence of acute cholecystitis. 4. Aortic atherosclerosis, in addition to least right coronary artery disease. 5. There are calcifications of the mitral annulus. Echocardiographic correlation for evaluation of potential valvular dysfunction may be warranted if clinically indicated. Electronically Signed   By: Vinnie Langton M.D.   On: 01/01/2019 14:36   Dg Chest 2 View  Result Date: 01/01/2019 CLINICAL DATA:  Medical clearance for cholecystitis EXAM: CHEST - 2 VIEW COMPARISON:  03/27/2018 FINDINGS: Emphysematous hyperinflation of the lungs without acute pulmonary consolidation. Normal heart size and mediastinal contours with aortic atherosclerosis. Vascular clip projects over the base of the neck on the left. 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 IMPRESSION: Emphysematous hyperinflation of the lungs without acute pulmonary disease. Electronically Signed    By: Ashley Royalty M.D.   On: 01/01/2019 18:03   Dg Chest Port 1 View  Result Date: 01/02/2019 CLINICAL DATA:  85 y/o  M; unresponsiveness. EXAM: PORTABLE CHEST 1 VIEW COMPARISON:  01/01/2019 chest radiograph FINDINGS: Normal cardiac silhouette. Aortic atherosclerosis with calcification. Clear lungs. No pleural effusion or pneumothorax. No acute osseous abnormality is evident. Surgical clips project over the gastroesophageal junction.  IMPRESSION: No acute pulmonary process identified. Electronically Signed   By: Kristine Garbe M.D.   On: 01/02/2019 06:11   US Abdomen Limited Ruq  Result Date: 01/01/2019 CLINICAL DATA:  Upper abdominal pain EXAM: ULTRASOUND ABDOMEN LIMITED RIGHT UPPER QUADRANT COMPARISON:  CT from earlier in the same day. FINDINGS: Gallbladder: Gallbladder is well distended with wall thickening to 5.7 mm and edema within the gallbladder wall. Gallstones are noted. Positive sonographic Murphy's sign is noted. Common bile duct: Diameter: 10 mm Liver: Liver is within normal limits with the exception of a 1 cm hyperechoic focus within the left lobe consistent with a small hemangioma. Portal vein is patent on color Doppler imaging with normal direction of blood flow towards the liver. IMPRESSION: Changes consistent with acute calculous cholecystitis in the appropriate clinical setting. Hyperechoic focus within the left lobe of the liver most consistent with small hemangioma. Electronically Signed   By: Inez Catalina M.D.   On: 01/01/2019 16:34    CBC Recent Labs  Lab 01/01/19 1150 01/02/19 0458 01/03/19 1012  WBC 11.2* 19.8* 8.5  HGB 13.0 12.9* 11.3*  HCT 38.5* 38.7* 35.0*  PLT 92* 89* 60*  MCV 97.5 100.5* 104.8*  MCH 32.9 33.5 33.8  MCHC 33.8 33.3 32.3  RDW 13.1 13.7 14.1  LYMPHSABS 0.4*  --  0.6*  MONOABS 1.0  --  0.4  EOSABS 0.0  --  0.0  BASOSABS 0.0  --  0.0    Chemistries  Recent Labs  Lab 01/01/19 1150 01/01/19 2140 01/02/19 1335 01/03/19 1012  01/04/19 0539  NA 142  --  141 138 141  K 4.7  --  4.9 4.5 4.6  CL 108  --  107 105 112*  CO2 23  --  21* 22 19*  GLUCOSE 152*  --  148* 115* 96  BUN 46*  --  59* 67* 73*  CREATININE 1.75* 1.96* 2.33* 2.70* 2.79*  CALCIUM 9.4  --  8.6* 8.3* 8.3*  MG  --   --  1.6* 1.8  --   AST 20  --  98*  --   --   ALT 9  --  69*  --   --   ALKPHOS 90  --  75  --   --   BILITOT 1.4*  --  0.9  --   --     Lab Results  Component Value Date   HGBA1C 5.3 10/05/2013   ----------------------------------------------------------------------------------------------------------------- Recent Labs    01/01/19 1957  VITAMINB12 248  FOLATE 10.9  FERRITIN 339*  TIBC 286  IRON 18*  RETICCTPCT 1.3   Cardiac Enzymes Recent Labs  Lab 01/02/19 0514 01/02/19 1335 01/02/19 2000  TROPONINI 0.05* 0.06* 0.05*   Roxan Hockey M.D on 01/04/2019 at 6:42 PM  Go to [email or web address] - for contact info  [facility] - Office  [phone]

## 2019-01-04 NOTE — Progress Notes (Signed)
Progress Note: General Surgery Service   Assessment/Plan: Principal Problem:   Acute gangrenous cholecystitis s/p lap cholecystectomy 01/02/2019 Active Problems:   Essential hypertension   CAD S/P percutaneous coronary angioplasty - DES in RCA with PTCA for ISR; Moderate mLAD, 80% ostial OM2 stable   H/O Inferior STEMI (09/2013) emergent PCI with Promus DES to RCA (3.0 mm  x 28 mm - 3.2 mm)   GERD (gastroesophageal reflux disease)   Malnutrition of moderate degree   Benign prostatic hyperplasia   Unintentional weight loss   History of medication noncompliance   History of partial gastrectomy   Pill dysphagia   CKD (chronic kidney disease), stage III ([facility])   Protein-calorie malnutrition, severe  s/p Procedure(s): LAPAROSCOPIC CHOLECYSTECTOMY 01/02/2019 -tolerating diet -ok to discharge once PT level and goals established -continue drain -continue abx  Malnutrition -PT/ambulate    LOS: 3 days  Chief Complaint/Subjective: Pain minimal, wants to try to get out of bed today, tolerating diet  Objective: Vital signs in last 24 hours: Temp:  [98.2 F (36.8 C)-98.5 F (36.9 C)] 98.4 F (36.9 C) (01/13 0607) Pulse Rate:  [66-75] 72 (01/13 0607) Resp:  [16-20] 16 (01/13 0607) BP: (101-138)/(49-66) 138/66 (01/13 0607) SpO2:  [100 %] 100 % (01/13 0607) Last BM Date: 01/02/19  Intake/Output from previous day: 01/12 0701 - 01/13 0700 In: 1444.8 [P.O.:240; I.V.:1075.4; IV Piggyback:129.3] Out: 1045 [Urine:500; Drains:545] Intake/Output this shift: No intake/output data recorded.  Lungs: nonlabored breathing  Cardiovascular: RRR  Abd: soft, incisions c/d/i, drain with large amount thin fluid  Extremities: no edema  Neuro: AOx4  Lab Results: CBC  Recent Labs    01/02/19 0458 01/03/19 1012  WBC 19.8* 8.5  HGB 12.9* 11.3*  HCT 38.7* 35.0*  PLT 89* 60*   BMET Recent Labs    01/03/19 1012 01/04/19 0539  NA 138 141  K 4.5 4.6  CL 105 112*  CO2 22 19*    GLUCOSE 115* 96  BUN 67* 73*  CREATININE 2.70* 2.79*  CALCIUM 8.3* 8.3*   PT/INR No results for input(s): LABPROT, INR in the last 72 hours. ABG No results for input(s): PHART, HCO3 in the last 72 hours.  Invalid input(s): PCO2, PO2  Studies/Results:  Anti-infectives: Anti-infectives (From admission, onward)   Start     Dose/Rate Route Frequency Ordered Stop   01/02/19 1400  piperacillin-tazobactam (ZOSYN) IVPB 2.25 g  Status:  Discontinued     2.25 g 100 mL/hr over 30 Minutes Intravenous Every 8 hours 01/02/19 1223 01/02/19 1224   01/02/19 1400  piperacillin-tazobactam (ZOSYN) IVPB 2.25 g     2.25 g 100 mL/hr over 30 Minutes Intravenous Every 8 hours 01/02/19 1224 01/07/19 1359   01/02/19 0909  ceFAZolin (ANCEF) 2-4 GM/100ML-% IVPB  Status:  Discontinued    Note to Pharmacy:  Guerry Bruin   : cabinet override      01/02/19 0909 01/02/19 0916   01/02/19 0600  ceFAZolin (ANCEF) IVPB 2g/100 mL premix  Status:  Discontinued     2 g 200 mL/hr over 30 Minutes Intravenous On call to O.R. 01/01/19 1909 01/02/19 1218   01/02/19 0600  metroNIDAZOLE (FLAGYL) IVPB 500 mg     500 mg 100 mL/hr over 60 Minutes Intravenous On call to O.R. 01/01/19 1909 01/02/19 0836   01/01/19 1730  cefTRIAXone (ROCEPHIN) 2 g in sodium chloride 0.9 % 100 mL IVPB  Status:  Discontinued     2 g 200 mL/hr over 30 Minutes Intravenous  Once 01/01/19 1728 01/02/19  1218      Medications: Scheduled Meds: . acetaminophen  1,000 mg Oral TID  . aspirin EC  81 mg Oral Daily  . brinzolamide  1 drop Left Eye Q12H  . bupivacaine liposome  20 mL Infiltration Once  . calcium-vitamin D  1 tablet Oral QODAY  . Chlorhexidine Gluconate Cloth  6 each Topical Once  . feeding supplement  237 mL Oral BID BM  . gabapentin  300 mg Oral QHS  . heparin  5,000 Units Subcutaneous Q8H  . hydrALAZINE  25 mg Oral TID  . isosorbide mononitrate  30 mg Oral Daily  . latanoprost  1 drop Both Eyes QHS  . lip balm  1 application  Topical BID  . metoprolol tartrate  25 mg Oral BID  . mirtazapine  7.5 mg Oral QHS  . polyethylene glycol  17 g Oral BID  . pravastatin  20 mg Oral q1800   Continuous Infusions: . sodium chloride    . methocarbamol (ROBAXIN) IV    . ondansetron (ZOFRAN) IV    . piperacillin-tazobactam 2.25 g (01/04/19 0607)   PRN Meds:.albuterol, alum & mag hydroxide-simeth, bisacodyl, fentaNYL (SUBLIMAZE) injection, guaiFENesin-dextromethorphan, hydrocortisone, hydrocortisone cream, labetalol, magic mouthwash, magnesium hydroxide, menthol-cetylpyridinium, methocarbamol (ROBAXIN) IV, ondansetron (ZOFRAN) IV **OR** ondansetron (ZOFRAN) IV, phenol, polyethylene glycol, prochlorperazine, traMADol, traZODone  Mickeal Skinner, MD [facility]

## 2019-01-04 NOTE — Care Management Important Message (Signed)
Important Message  Patient Details  Name: Shantel Wesely MRN: [id number] Date of Birth: 11-13-1930   Medicare Important Message Given:  Yes    Kerin Salen 01/04/2019, 12:10 [place] Message  Patient Details  Name: Elisha Cooksey MRN: [id number] Date of Birth: 03-02-1930   Medicare Important Message Given:  Yes    Kerin Salen 01/04/2019, 12:10 PM

## 2019-01-04 NOTE — Evaluation (Signed)
Physical Therapy Evaluation Patient Details Name: Nicholas Porter MRN: [id number] DOB: [birth year] Today's Date: 01/04/2019   History of Present Illness  Pt is an 85 year old male s/p Acute gangrenous cholecystitis s/p lap cholecystectomy 01/02/2019 with PMHx significant for CAD with prior angioplasty and stenting, PAD with prior left carotid endodiathermy, inferior STEMI, COPD  Clinical Impression  Pt admitted with above diagnosis. Pt currently with functional limitations due to the deficits listed below (see PT Problem List).  Pt will benefit from skilled PT to increase their independence and safety with mobility to allow discharge to the venue listed below.   Pt assisted OOB and over to recliner.  Pt reports being very independent and doing yardwork prior to admission.  No family present at time of evaluation.  Pt would benefit from d/c to SNF.     Follow Up Recommendations SNF    Equipment Recommendations  Rolling walker with 5" wheels    Recommendations for Other Services       Precautions / Restrictions Precautions Precautions: Fall Precaution Comments: currently on oxygen (does not wear at baseline), JP drain      Mobility  Bed Mobility Overal bed mobility: Needs Assistance                Transfers Overall transfer level: Needs assistance Equipment used: Rolling walker (2 wheeled) Transfers: Sit to/from [facility] Sit to Stand: Min assist Stand pivot transfers: Min assist       General transfer comment: verbal cues for hand placement, assist to rise and steady, remained on oxygen  Ambulation/Gait                Stairs            Wheelchair Mobility    Modified Rankin (Stroke Patients Only)       Balance Overall balance assessment: Needs assistance         Standing balance support: Bilateral upper extremity supported Standing balance-Leahy Scale: Poor Standing balance comment: requiring UE support at this time                              Pertinent Vitals/Pain Pain Assessment: Faces Faces Pain Scale: Hurts little more Pain Location: abdomen with movement Pain Descriptors / Indicators: Aching;Sore Pain Intervention(s): Monitored during session    Home Living Family/patient expects to be discharged to:: Private residence Living Arrangements: Spouse/significant other   Type of Home: House Home Access: Stairs to enter   [employer] of Steps: 2 Home Layout: One level Home Equipment: Environmental consultant - 2 wheels;Cane - single point      Prior Function Level of Independence: Independent         Comments: reports he likes doing yardwork outside     [employer]        Extremity/Trunk Assessment        Lower Extremity Assessment Lower Extremity Assessment: Generalized weakness       Communication   Communication: HOH  Cognition Arousal/Alertness: Awake/alert Behavior During Therapy: WFL for tasks assessed/performed Overall Cognitive Status: Within Functional Limits for tasks assessed                                        General Comments      Exercises     Assessment/Plan    PT Assessment Patient needs continued PT services  PT Problem List Decreased strength;Decreased mobility;Decreased activity tolerance;Decreased balance;Decreased knowledge of use of DME       PT Treatment Interventions Functional mobility training;DME instruction;Balance training;Gait training;Therapeutic activities;Neuromuscular re-education;Therapeutic exercise;Patient/family education    PT Goals (Current goals can be found in the Care Plan section)  Acute Rehab PT Goals PT Goal Formulation: With patient Time For Goal Achievement: 01/18/19 Potential to Achieve Goals: Good    Frequency Min 2X/week   Barriers to discharge        Co-evaluation               AM-PAC PT "6 Clicks" Mobility  Outcome Measure Help needed turning from your back to your side  while in a flat bed without using bedrails?: A Little Help needed moving from lying on your back to sitting on the side of a flat bed without using bedrails?: A Little Help needed moving to and from a bed to a chair (including a wheelchair)?: A Little Help needed standing up from a chair using your arms (e.g., wheelchair or bedside chair)?: A Little Help needed to walk in hospital room?: A Lot Help needed climbing 3-5 steps with a railing? : A Lot 6 Click Score: 16    End of Session Equipment Utilized During Treatment: Gait belt;Oxygen Activity Tolerance: Patient tolerated treatment well Patient left: in chair;with call bell/phone within reach;with chair alarm set   PT Visit Diagnosis: Other abnormalities of gait and mobility (R26.89);Muscle weakness (generalized) (M62.81)    Time: 3709-6438 PT Time Calculation (min) (ACUTE ONLY): 13 min   Charges:   PT Evaluation $PT Eval Low Complexity: [place], PT, DPT Acute Rehabilitation Services Office: [phone] Pager: [phone]  Trena Platt 01/04/2019, 12:27 PM

## 2019-01-05 ENCOUNTER — Inpatient Hospital Stay (HOSPITAL_COMMUNITY): Payer: Medicare Other

## 2019-01-05 LAB — COMPREHENSIVE METABOLIC PANEL
ALK PHOS: 49 U/L (ref 38–126)
ALT: 15 U/L (ref 0–44)
ALT: 14 U/L (ref 0–44)
AST: 19 U/L (ref 15–41)
AST: 18 U/L (ref 15–41)
Albumin: 2.7 g/dL — ABNORMAL LOW (ref 3.5–5.0)
Albumin: 2.8 g/dL — ABNORMAL LOW (ref 3.5–5.0)
Alkaline Phosphatase: 49 U/L (ref 38–126)
Anion gap: 11 (ref 5–15)
Anion gap: 10 (ref 5–15)
BILIRUBIN TOTAL: 0.9 mg/dL (ref 0.3–1.2)
BUN: 73 mg/dL — ABNORMAL HIGH (ref 8–23)
BUN: 73 mg/dL — ABNORMAL HIGH (ref 8–23)
CO2: 17 mmol/L — ABNORMAL LOW (ref 22–32)
CO2: 18 mmol/L — ABNORMAL LOW (ref 22–32)
CREATININE: 2.63 mg/dL — AB (ref 0.61–1.24)
Calcium: 8.3 mg/dL — ABNORMAL LOW (ref 8.9–10.3)
Calcium: 8.5 mg/dL — ABNORMAL LOW (ref 8.9–10.3)
Chloride: 117 mmol/L — ABNORMAL HIGH (ref 98–111)
Chloride: 118 mmol/L — ABNORMAL HIGH (ref 98–111)
Creatinine, Ser: 2.65 mg/dL — ABNORMAL HIGH (ref 0.61–1.24)
GFR calc Af Amer: 24 mL/min — ABNORMAL LOW (ref >60)
GFR calc Af Amer: 24 mL/min — ABNORMAL LOW (ref >60)
GFR calc non Af Amer: 21 mL/min — ABNORMAL LOW (ref >60)
GFR calc non Af Amer: 21 mL/min — ABNORMAL LOW (ref >60)
Glucose, Bld: 123 mg/dL — ABNORMAL HIGH (ref 70–99)
Glucose, Bld: 119 mg/dL — ABNORMAL HIGH (ref 70–99)
Potassium: 4.3 mmol/L (ref 3.5–5.1)
Potassium: 5 mmol/L (ref 3.5–5.1)
Sodium: 145 mmol/L (ref 135–145)
Sodium: 146 mmol/L — ABNORMAL HIGH (ref 135–145)
TOTAL PROTEIN: 5.5 g/dL — AB (ref 6.5–8.1)
Total Bilirubin: 0.9 mg/dL (ref 0.3–1.2)
Total Protein: 5.7 g/dL — ABNORMAL LOW (ref 6.5–8.1)

## 2019-01-05 LAB — LIPASE, BLOOD: LIPASE: 76 U/L — AB (ref 11–51)

## 2019-01-05 LAB — CBC
HCT: 31.9 % — ABNORMAL LOW (ref 39.0–52.0)
HEMOGLOBIN: 10.1 g/dL — AB (ref 13.0–17.0)
MCH: 33.6 pg (ref 26.0–34.0)
MCHC: 31.7 g/dL (ref 30.0–36.0)
MCV: 106 fL — ABNORMAL HIGH (ref 80.0–100.0)
Platelets: 71 10*3/uL — ABNORMAL LOW (ref 150–400)
RBC: 3.01 MIL/uL — AB (ref 4.22–5.81)
RDW: 14.5 % (ref 11.5–15.5)
WBC: 6.9 10*3/uL (ref 4.0–10.5)
nRBC: 0 % (ref 0.0–0.2)

## 2019-01-05 MED ORDER — LORAZEPAM 2 MG/ML IJ SOLN
0.5000 mg | Freq: Once | INTRAMUSCULAR | Status: AC
  Administered 2019-01-06: 0.5 mg via INTRAVENOUS
  Filled 2019-01-05: qty 1

## 2019-01-05 MED ORDER — TECHNETIUM TC 99M MEBROFENIN IV KIT
5.0300 | PACK | Freq: Once | INTRAVENOUS | Status: AC | PRN
  Administered 2019-01-05: 5.03 via INTRAVENOUS

## 2019-01-05 NOTE — Consult Note (Addendum)
Referring Provider:  Modena Jansky, PA-C with [facility] Primary Care Physician:  Dorothyann Peng, NP Primary Gastroenterologist:  Dr. Sharlett Iles   Reason for Consultation:  Bile leak  HPI: Nicholas Porter is a 85 y.o. male with PMH listed below who underwent lap chole for gangrenous gallbladder on 01/02/2019.  Had bilious drain output so HIDA scan was performed this morning and showed the following:  IMPRESSION: 1. Active bile leak with excreted radiotracer filling the gallbladder fossa and extending into the RIGHT lower quadrant peritoneal space. 2. No significant counts transit to the small bowel.  LFTs are normal as well as WBC count.  He is still on IV abx.  He is confused but denies any significant abdominal pain.  Is sitting up in the chair.  Thinks that he is at a school.   Past Medical History:  Diagnosis Date  . Acute metabolic encephalopathy 07/24/9936  . Acute renal failure superimposed on stage 4 chronic kidney disease ([place]) 11/27/2015  . Anemia of chronic renal failure, stage 4 (severe) ([place]) 11/27/2015  . 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 CAD S/P percutaneous coronary angioplasty - DES in RCA with PTCA for ISR; Moderate mLAD, 80% ostial OM2 stable 05/22/2008   Qualifier: Diagnosis of  By: Linda Hedges MD, Heinz Knuckles   . CKD (chronic kidney disease) stage 3, GFR 30-59 ml/min ([place])   . COPD (chronic obstructive pulmonary disease) ([place])   . Essential hypertension 06/30/2007   Qualifier: Diagnosis of  By: Cori Razor RN, Mikal Plane Eye abnormalities    injections in eyes 12/2017  . H/O Inferior STEMI (09/2013) emergent PCI with Promus DES to RCA (3.0 mm  x 28 mm - 3.2 mm) 10/05/2013  . Klebsiella sepsis ([place]) 04/02/2017  . Left-sided carotid artery disease -- s/p CEA 04/06/2006   S/p Left CEA   . Mitral regurgitation - onexam 09/03/2016    Past Surgical History:  Procedure Laterality Date  .  AMPUTATION     left index finger -tramatic  . CARDIAC CATHETERIZATION  November 2011   40% mid LAD, 50% proximal RCA, 30-40% distal RCA (DOMINANT RCA with wraparound PDA & 2 large PLBs), 60-70% ostial OM1. No change from 2001. Medical therapy  . CAROTID ENDARTERECTOMY Left 2007   Dr.Hayes: 08/2016 Dopplers: Stable less than 40% right internal carotid stenosis and stable moderate 40-60% left internal carotid stenosis. Patent vertebral and subclavian arteries.  . CARPAL TUNNEL RELEASE    . CHOLECYSTECTOMY N/A 01/02/2019   Procedure: LAPAROSCOPIC CHOLECYSTECTOMY;  Surgeon: Michael Boston, MD;  Location: [facility];  Service: General;  Laterality: N/A;  . CORNEAL TRANSPLANT    . CORONARY ANGIOPLASTY WITH STENT PLACEMENT  09/2013; 05/2014    d) Promus DES 3.0 mm x 28 mm (3.2 mm)  to RCA with STEMI; residual 70% mid-distal LAD, OM 270-80%. Medical management; b) PTCA of 75 % ISR , LAD lesion noted as ~40%  . FOOT SURGERY     left  . HERNIA REPAIR    . LEFT HEART CATHETERIZATION WITH CORONARY ANGIOGRAM N/A 10/05/2013   Procedure: LEFT HEART CATHETERIZATION WITH CORONARY ANGIOGRAM;  Surgeon: Peter M Martinique, MD;  Location: [facility] C[facility] LAB;  Service: Cardiovascular;  RCA 100% - PCI,CxOM stable, LAD ~70% mid;  . LEFT HEART CATHETERIZATION WITH CORONARY ANGIOGRAM N/A 06/08/2014   Procedure: LEFT HEART CATHETERIZATION WITH CORONARY ANGIOGRAM;  Surgeon: Troy Sine, MD;  Location: [facility] C[facility] LAB;  Service: Cardiovascular;  UA 6/'15: 75% ISR in RCA - PTCA, LAD lesion ~40%, CxOM stable.    Marland Kitchen NM MYOVIEW LTD  03/2017    - Elevatred Troponin in setting of Sepsis -- DEMAND ISCHEMIA.  Normal perfusion. LVEF 51% with normal wall motion. This is a low risk study.  Marland Kitchen PARTIAL GASTRECTOMY    . PERCUTANEOUS CORONARY INTERVENTION-BALLOON ONLY  06/08/2014   Procedure: PERCUTANEOUS CORONARY INTERVENTION-BALLOON ONLY;  Surgeon: Troy Sine, MD;  Location: [facility] CATH LAB;  Service: Cardiovascular;;  . ROTATOR CUFF REPAIR     left  .  SHOULDER ARTHROSCOPY WITH ROTATOR CUFF REPAIR AND SUBACROMIAL DECOMPRESSION Right 07/01/2013   Procedure: RIGHT SHOULDER ARTHROSCOPY WITH  SUBACROMIAL DECOMPRESSION/DISTAL CLAVICLE RESECTION/ROTATOR CUFF REPAIR ;  Surgeon: Marin Shutter, MD;  Location: [facility];  Service: Orthopedics;  Laterality: Right;  . SPINE SURGERY  1985   cervical laminectomy  . TOTAL KNEE ARTHROPLASTY    . TRANSTHORACIC ECHOCARDIOGRAM  09/'17; 4/'18   a) Normal EF 55-60%. Mild to moderate aortic stenosis with moderate regurgitation. Mild MR.;; b) Mild global reduction in LV systolic function: 61-44%; grade 1 DD - high LVEDP. Mild AS/AI/TR. Mild-Mod MR; moderately elevated PAP.  Marland Kitchen VAGOTOMY     partial gastrectomy    Prior to Admission medications   Medication Sig Start Date End Date Taking? Authorizing Provider  acetaminophen (TYLENOL) 325 MG tablet Take 650 mg by mouth every 6 (six) hours as needed for mild pain or headache.   Yes [provider]  brinzolamide (AZOPT) 1 % ophthalmic suspension Place 1 drop into the left eye every 12 (twelve) hours.    Yes [provider]  CALCIUM PO Take 1 tablet by mouth every other day.   Yes [provider]  metoprolol succinate (TOPROL-XL) 25 MG 24 hr tablet Take 1 tablet (25 mg total) by mouth daily. 04/22/18  Yes Leonie Man, MD  nitroGLYCERIN (NITROSTAT) 0.4 MG SL tablet DISSOLVE 1 TABLET UNDER THE TONGUE EVERY 5 MINUTES AS NEEDED FOR CHEST PAIN Patient taking differently: Place 0.4 mg under the tongue every 5 (five) minutes as needed for chest pain. DISSOLVE 1 TABLET UNDER THE TONGUE EVERY 5 MINUTES AS NEEDED FOR CHEST PAIN 08/21/17  Yes Leonie Man, MD  torsemide (DEMADEX) 20 MG tablet Take 40 mg by mouth 2 (two) times daily.    Yes Penninger, Ria Comment, [place]  TRAVATAN Z 0.004 % SOLN ophthalmic solution Place 1 drop into both eyes at bedtime.  04/20/13  Yes [provider]  aspirin EC 81 MG tablet Take 1 tablet (81 mg total) by mouth  daily. Patient not taking: Reported on 01/01/2019 04/23/17   Leonie Man, MD    Current Facility-Administered Medications  Medication Dose Route Frequency Provider Last Rate Last Dose  . 0.9 %  sodium chloride infusion   Intravenous Continuous Denton Brick, Courage, MD 100 mL/hr at 01/05/19 0857    . acetaminophen (TYLENOL) tablet 1,000 mg  1,000 mg Oral TID Michael Boston, MD   1,000 mg at 01/05/19 1157  . albuterol (PROVENTIL) (2.5 MG/3ML) 0.083% nebulizer solution 2.5 mg  2.5 mg Nebulization Q2H PRN Michael Boston, MD      . alum & mag hydroxide-simeth (MAALOX/MYLANTA) 200-200-20 MG/5ML suspension 30 mL  30 mL Oral Q6H PRN Michael Boston, MD      . aspirin EC tablet 81 mg  81 mg Oral Daily Michael Boston, MD   81 mg at 01/05/19 1157  . bisacodyl (DULCOLAX) suppository 10 mg  10 mg Rectal Q12H PRN Michael Boston, MD      . brinzolamide (AZOPT) 1 % ophthalmic suspension 1 drop  1 drop Left Eye Gorden Harms, MD   1 drop at 01/05/19 1203  . bupivacaine liposome (EXPAREL) 1.3 % injection 266 mg  20 mL Infiltration Once Michael Boston, MD      . calcium-vitamin D (OSCAL WITH D) 500-200 MG-UNIT per tablet 1 tablet  1 tablet Oral Carlis Abbott, MD   1 tablet at 01/05/19 1156  . Chlorhexidine Gluconate Cloth 2 % PADS 6 each  6 each Topical Once Michael Boston, MD      . feeding supplement (ENSURE SURGERY) liquid 237 mL  237 mL Oral BID BM Michael Boston, MD   237 mL at 01/05/19 1156  . fentaNYL (SUBLIMAZE) injection 25-50 mcg  25-50 mcg Intravenous Q1H PRN Michael Boston, MD   50 mcg at 01/02/19 1122  . guaiFENesin-dextromethorphan (ROBITUSSIN DM) 100-10 MG/5ML syrup 10 mL  10 mL Oral Q4H PRN Michael Boston, MD      . hydrALAZINE (APRESOLINE) tablet 25 mg  25 mg Oral TID Michael Boston, MD   25 mg at 01/05/19 1157  . hydrocortisone (ANUSOL-HC) 2.5 % rectal cream 1 application  1 application Topical QID PRN Michael Boston, MD      . hydrocortisone cream 1 % 1 application  1 application Topical TID PRN  Michael Boston, MD      . isosorbide mononitrate (IMDUR) 24 hr tablet 30 mg  30 mg Oral Daily Michael Boston, MD   30 mg at 01/05/19 1157  . labetalol (NORMODYNE,TRANDATE) injection 10 mg  10 mg Intravenous Q4H PRN Michael Boston, MD      . latanoprost (XALATAN) 0.005 % ophthalmic solution 1 drop  1 drop Both Eyes Ardeen Fillers, MD   1 drop at 01/04/19 2152  . lip balm (CARMEX) ointment 1 application  1 application Topical BID Michael Boston, MD   1 application at 01/75/10 1204  . magic mouthwash  15 mL Oral QID PRN Michael Boston, MD      . magnesium hydroxide (MILK OF MAGNESIA) suspension 30 mL  30 mL Oral Q12H PRN Michael Boston, MD      . menthol-cetylpyridinium (CEPACOL) lozenge 3 mg  1 lozenge Oral PRN Michael Boston, MD      . methocarbamol (ROBAXIN) 1,000 mg in dextrose 5 % 50 mL IVPB  1,000 mg Intravenous Q6H PRN Michael Boston, MD      . metoprolol tartrate (LOPRESSOR) tablet 25 mg  25 mg Oral BID Michael Boston, MD   25 mg at 01/05/19 1157  . mirtazapine (REMERON) tablet 7.5 mg  7.5 mg Oral Ardeen Fillers, MD   7.5 mg at 01/04/19 2152  . ondansetron (ZOFRAN) injection 4 mg  4 mg Intravenous Q6H PRN Michael Boston, MD   4 mg at 01/02/19 1102   Or  . ondansetron (ZOFRAN) 8 mg in sodium chloride 0.9 % 50 mL IVPB  8 mg Intravenous Q6H PRN Michael Boston, MD      . phenol (CHLORASEPTIC) mouth spray 1-2 spray  1-2 spray Mouth/Throat PRN Michael Boston, MD      . piperacillin-tazobactam (ZOSYN) IVPB 2.25 g  2.25 g Intravenous Tor Netters, MD 100 mL/hr at 01/05/19 0639 2.25 g at 01/05/19 0639  . polyethylene glycol (MIRALAX / GLYCOLAX) packet 17 g  17 g Oral Daily PRN Michael Boston, MD      . polyethylene glycol (MIRALAX / GLYCOLAX)  packet 17 g  17 g Oral BID Michael Boston, MD   17 g at 01/05/19 1157  . pravastatin (PRAVACHOL) tablet 20 mg  20 mg Oral q1800 Michael Boston, MD   20 mg at 01/04/19 1729  . prochlorperazine (COMPAZINE) injection 5-10 mg  5-10 mg Intravenous Q4H PRN Michael Boston,  MD      . traMADol Veatrice Bourbon) tablet 50-100 mg  50-100 mg Oral Q6H PRN Michael Boston, MD   50 mg at 01/04/19 1305  . traZODone (DESYREL) tablet 50 mg  50 mg Oral QHS PRN Michael Boston, MD        Allergies as of 01/01/2019 - Review Complete 01/01/2019  Allergen Reaction Noted  . Amlodipine besylate Hives 12/27/2010  . Hydralazine Itching 09/05/2014  . Lipitor [atorvastatin] Itching 06/27/2014  . Naproxen Diarrhea 06/30/2007  . Oxycodone-acetaminophen Itching 06/30/2007  . Isosorbide Rash 03/27/2017    Family History  Problem Relation Age of Onset  . Heart disease Sister        Heart Transplant   . Diabetes Sister     Social History   Socioeconomic History  . Marital status: Married    Spouse name: Not on file  . Number of children: Not on file  . Years of education: Not on file  . Highest education level: Not on file  Occupational History    Employer: RETIRED    Comment: Retired  Scientific laboratory technician  . Financial resource strain: Not on file  . Food insecurity:    Worry: Not on file    Inability: Not on file  . Transportation needs:    Medical: Not on file    Non-medical: Not on file  Tobacco Use  . Smoking status: Former Smoker    Types: Cigarettes    Last attempt to quit: 12/23/1964    Years since quitting: 54.0  . Smokeless tobacco: Never Used  Substance and Sexual Activity  . Alcohol use: No  . Drug use: No  . Sexual activity: Not on file  Lifestyle  . Physical activity:    Days per week: Not on file    Minutes per session: Not on file  . Stress: Not on file  Relationships  . Social connections:    Talks on phone: Not on file    Gets together: Not on file    Attends religious service: Not on file    Active member of club or organization: Not on file    Attends meetings of clubs or organizations: Not on file    Relationship status: Not on file  . Intimate partner violence:    Fear of current or ex partner: Not on file    Emotionally abused: Not on file     Physically abused: Not on file    Forced sexual activity: Not on file  Other Topics Concern  . Not on file  Social History Narrative   He is married with 2 children. He lives with his wife.   Very active at home, prior to a STEMI, he could walk several miles without discomfort. Prior to his MI, he would use his push mower to mow his entire lawn.   He is a former smoker and quit back in 1966. He does not drink alcohol    Review of Systems: ROS is O/W negative except as mentioned in HPI.  Physical Exam: Vital signs in last 24 hours: Temp:  [98 F (36.7 C)-98.5 F (36.9 C)] 98.2 F (36.8 C) (01/14 1243) Pulse Rate:  [79-87]  87 (01/14 1243) Resp:  [18] 18 (01/14 1243) BP: (137-165)/(67-77) 165/77 (01/14 1243) SpO2:  [97 %-99 %] 99 % (01/14 1243) Last BM Date: 01/05/19 General:  Alert, Well-developed, well-nourished, pleasant and cooperative in NAD Head:  Normocephalic and atraumatic. Eyes:  Sclera clear, no icterus.  Conjunctiva pink. Ears:  Normal auditory acuity. Mouth:  No deformity or lesions.   Lungs:  Clear throughout to auscultation.  No wheezes, crackles, or rhonchi.  Heart:  Regular rate and rhythm; no murmurs, clicks, rubs, or gallops. Abdomen:  Soft, non-distended.  BS present.  Incision sites noted and look good.  Drain noted exiting from just above his umbilicus-large amount of dark brown bile/sanginous fluid.  Minimal TTP. Msk:  Symmetrical without gross deformities. Pulses:  Normal pulses noted. Extremities:  Without clubbing or edema. Neurologic:  Alert and oriented x 2 (did not know where he was);  grossly normal neurologically. Skin:  Intact without significant lesions or rashes. Psych:  Alert and cooperative. Normal mood and affect.  Intake/Output from previous day: 01/13 0701 - 01/14 0700 In: 1785.3 [P.O.:220; I.V.:1415.4; IV Piggyback:149.9] Out: 1972 [Urine:1100; Drains:872] Intake/Output this shift: Total I/O In: -  Out: 100 [Drains:100]  Lab  Results: Recent Labs    01/03/19 1012 01/05/19 0735  WBC 8.5 6.9  HGB 11.3* 10.1*  HCT 35.0* 31.9*  PLT 60* 71*   BMET Recent Labs    01/04/19 0539 01/05/19 0735 01/05/19 0930  NA 141 145 146*  K 4.6 4.3 5.0  CL 112* 117* 118*  CO2 19* 17* 18*  GLUCOSE 96 123* 119*  BUN 73* 73* 73*  CREATININE 2.79* 2.63* 2.65*  CALCIUM 8.3* 8.3* 8.5*   LFT Recent Labs    01/05/19 0930  PROT 5.7*  ALBUMIN 2.8*  AST 18  ALT 14  ALKPHOS 49  BILITOT 0.9   Studies/Results: Nm Hepatobiliary Including Gb  Result Date: 01/05/2019 CLINICAL DATA:  Laparoscopic cholecystectomy. Cholecystectomy 01/02/2019. Bilious drain output. EXAM: NUCLEAR MEDICINE HEPATOBILIARY IMAGING TECHNIQUE: Sequential images of the abdomen were obtained out to 60 minutes following intravenous administration of radiopharmaceutical. RADIOPHARMACEUTICALS:  5.0 mCi Tc-74m  Choletec IV COMPARISON:  CT and ultrasound 01/01/2019 FINDINGS: Prompt clearance of radiotracer from the blood pool and homogeneous uptake in the liver. Excreted radiotracer quickly fills the common hepatic duct and extends into the gallbladder fossa. Radiotracer extends along the RIGHT hepatic margin and into RIGHT lower quadrant peritoneal space. Findings consistent with active bile leak. No counts evident within the bowel. IMPRESSION: 1. Active bile leak with excreted radiotracer filling the gallbladder fossa and extending into the RIGHT lower quadrant peritoneal space. 2. No significant counts transit to the small bowel. These results will be called to the ordering clinician or representative by the Radiologist Assistant, and communication documented in the PACS or zVision Dashboard. Electronically Signed   By: Suzy Bouchard M.D.   On: 01/05/2019 12:13   IMPRESSION:  *Bile leak s/p cholecystectomy for gangrenous gallbladder on 1/11.  Seen by HIDA scan today.  Still on abx per surgery.  PLAN: *Will need ERCP with stent likely 1/15 with Dr.  Ardis Hughs. *Monitor labs.   Laban Emperor. Zehr  01/05/2019, 1:16 PM   ________________________________________________________________________  Vel[facility] Heckler GI MD note:  I personally examined the patient, reviewed the data and agree with the assessment and plan described above.  Obvious bile leak by HIDA and bilious JP drainage following gangrenous GB resection 3 days ago.  Planning for ERCP with biliary stent placement tomorrow.     Quillian Quince  Ardis Hughs, MD [facility] Gastroenterology Pager [phone]'

## 2019-01-05 NOTE — Progress Notes (Signed)
CAlled by Radiology, HIDA scan positive for a leak.  [facility] called to see and evaluate.

## 2019-01-05 NOTE — Progress Notes (Signed)
Calorie Count Note  48 hour calorie count ordered (1/12-1/14).  Diet: currently NPO, previously Dysphagia 3 with thin liquids Supplements: Magic Cup TID and Ensure Surgery BID  Breakfast (1/14): NPO Lunch (1/14): NPO Dinner (1/13): 263 kcal, 8 grams of protein Supplements: unsure  Total intake: 263 kcal (16% of minimum estimated needs)  8 protein (11% of minimum estimated needs)  Estimated Nutritional Needs:  Kcal:  1600-1800 Protein:  75-85g Fluid:  1.8L/day  Nutrition Dx: Severe Malnutrition related to chronic illness(COPD, CKD, CHF) as evidenced by severe fat depletion, severe muscle depletion, energy intake < or equal to 75% for > or equal to 1 month.  Goal: Patient will meet greater than or equal to 90% of their needs. -unmet/unable to meet  Intervention:  - Diet advancement when medically feasible.   Patient NPO since this AM for HIDA scan which showed a leak. Surgery notes state GI has been consulted.     Jarome Matin, MS, RD, LDN, [facility] Inpatient Clinical Dietitian Pager # [phone] After hours/weekend pager # [phone]

## 2019-01-05 NOTE — Plan of Care (Signed)
  Problem: Activity: Goal: Risk for activity intolerance will decrease Outcome: Progressing   

## 2019-01-05 NOTE — Progress Notes (Signed)
Nicholas Porter [id number] 19-Nov-1930  CARE TEAM:  PCP: Dorothyann Peng, NP  Outpatient Care Team: Patient Care Team: Dorothyann Peng, NP as PCP - General (Family Medicine) Leonie Man, MD as PCP - Cardiology (Cardiology) Edrick Oh, MD as Consulting Physician (Nephrology) Clarene Essex, MD as Consulting Physician (Gastroenterology)  Inpatient Treatment Team: Treatment Team: Attending Provider: Roxan Hockey, MD; Consulting Physician: Nolon Nations, MD; Rounding Team: Garner Gavel, MD; Technician: Kizzie Furnish, NT; Registered Nurse: Saunders Glance, RN; Technician: Renea Ee, NT; Registered Nurse: Margarette Canada, RN; Technician: Dirk Dress, NT   Problem List:   Principal Problem:   Acute gangrenous cholecystitis s/p lap cholecystectomy 01/02/2019 Active Problems:   Essential hypertension   CAD S/P percutaneous coronary angioplasty - DES in RCA with PTCA for ISR; Moderate mLAD, 80% ostial OM2 stable   H/O Inferior STEMI (09/2013) emergent PCI with Promus DES to RCA (3.0 mm  x 28 mm - 3.2 mm)   GERD (gastroesophageal reflux disease)   Malnutrition of moderate degree   Benign prostatic hyperplasia   Unintentional weight loss   History of medication noncompliance   History of partial gastrectomy   Pill dysphagia   CKD (chronic kidney disease), stage III ([place])   Protein-calorie malnutrition, severe   3 Days Post-Op  01/02/2019  POST-OPERATIVE DIAGNOSIS:   Acute Gangrenous Cholecystitis History of vagatomy/antrectomy  PROCEDURE:  Laparoscopic cholecystectomy  Diagnostic laparoscopy Laparoscopic lysis adhesions x45 minutes (1/2 of case)  SURGEON:  Adin Hector, MD, FACS.     Assessment  Improved  [facility] Stay = 4 days)  Plan:  HIDA scan to r/o bile leak.  Keep surgical drain for now  Follow off IV fluids.  Antibiotics.  Suspect he will need 5 days total.  IV for now.  If ready discharge sooner, can switch over to oral  Augmentin.  Keep drain.  If output goes down and is more clear, can remove prior to discharge.  Otherwise may need to go home with follow-up in office.  With moderate volume and bilious output, leave in place for suspicion of bile leak  -VTE prophylaxis- SCDs, etc -mobilize as tolerated to help recovery  20 minutes spent in review, evaluation, examination, counseling, and coordination of care.  More than 50% of that time was spent in counseling.  I updated the patient's status to the patient and family.  Recommendations were made.  Questions were answered.  They expressed understanding & appreciation.   01/05/2019    Subjective: (Chief complaint)  Confused this morning.  Thinks he is in a cafeteria.  Denies much abdominal pain.  No nausea or vomiting.   Objective:  Vital signs:  Vitals:   01/04/19 1055 01/04/19 1253 01/04/19 2004 01/05/19 0639  BP: (!) 157/65 (!) 153/83 137/67   Pulse: 72 66 79   Resp:  (!) 22  18  Temp:  97.9 F (36.6 C) 98.5 F (36.9 C) 98 F (36.7 C)  TempSrc:  Oral Oral Oral  SpO2:  100% 99% 97%  Weight:      Height:        Last BM Date: 01/05/19  Intake/Output   Yesterday:  01/13 0701 - 01/14 0700 In: 1785.3 [P.O.:220; I.V.:1415.4; IV Piggyback:149.9] Out: 1972 [Urine:1100; Drains:872] This shift:  No intake/output data recorded.  Bowel function:  Flatus: YES  BM:  No  Drain: Reddish-brown output.  Froths, concerning for bile  Physical Exam:  General: Pt awake/alert/oriented x4 in no acute distress Eyes: PERRL, normal EOM.  Sclera clear.  No icterus Neuro: CN II-XII intact w/o focal sensory/motor deficits. Lymph: No head/neck/groin lymphadenopathy Psych:  No psychosis/paranoia/  Confused but not agitated HENT: Normocephalic, Mucus membranes moist.  No thrush Neck: Supple, No tracheal deviation Chest: No chest wall pain w good excursion CV:  Pulses intact.  Regular rhythm MS: Normal AROM mjr joints.  No obvious  deformity  Abdomen: Soft.  Nondistended.  Nontender.  No evidence of peritonitis.  No incarcerated hernias.  Ext:   No deformity.  No mjr edema.  No cyanosis Skin: No petechiae / purpura  Results:   Labs: Results for orders placed or performed during the hospital encounter of 01/01/19 (from the past 48 hour(s))  Basic metabolic panel     Status: Abnormal   Collection Time: 01/03/19 10:12 AM  Result Value Ref Range   Sodium 138 135 - 145 mmol/L   Potassium 4.5 3.5 - 5.1 mmol/L   Chloride 105 98 - 111 mmol/L   CO2 22 22 - 32 mmol/L   Glucose, Bld 115 (H) 70 - 99 mg/dL   BUN 67 (H) 8 - 23 mg/dL   Creatinine, Ser 2.70 (H) 0.61 - 1.24 mg/dL   Calcium 8.3 (L) 8.9 - 10.3 mg/dL   GFR calc non Af Amer 20 (L) >60 mL/min   GFR calc Af Amer 24 (L) >60 mL/min   Anion gap 11 5 - 15    Comment: Performed at [facility], [place] [address] [place], [place] [address]  CBC with Differential/Platelet     Status: Abnormal   Collection Time: 01/03/19 10:12 AM  Result Value Ref Range   WBC 8.5 4.0 - 10.5 K/uL   RBC 3.34 (L) 4.22 - 5.81 MIL/uL   Hemoglobin 11.3 (L) 13.0 - 17.0 g/dL   HCT 35.0 (L) 39.0 - 52.0 %   MCV 104.8 (H) 80.0 - 100.0 fL   MCH 33.8 26.0 - 34.0 pg   MCHC 32.3 30.0 - 36.0 g/dL   RDW 14.1 11.5 - 15.5 %   Platelets 60 (L) 150 - 400 K/uL    Comment: PLATELET COUNT CONFIRMED BY SMEAR Immature Platelet Fraction may be clinically indicated, consider ordering this additional test [id number]    nRBC 0.0 0.0 - 0.2 %   Neutrophils Relative % 87 %   Neutro Abs 7.5 1.7 - 7.7 K/uL   Lymphocytes Relative 7 %   Lymphs Abs 0.6 (L) 0.7 - 4.0 K/uL   Monocytes Relative 5 %   Monocytes Absolute 0.4 0.1 - 1.0 K/uL   Eosinophils Relative 0 %   Eosinophils Absolute 0.0 0.0 - 0.5 K/uL   Basophils Relative 0 %   Basophils Absolute 0.0 0.0 - 0.1 K/uL   Immature Granulocytes 1 %   Abs Immature Granulocytes 0.06 0.00 - 0.07 K/uL    Comment: Performed at [facility], [place] [address], [place], [place] [address]  Magnesium     Status: None   Collection Time: 01/03/19 10:12 AM  Result Value Ref Range   Magnesium 1.8 1.7 - 2.4 mg/dL    Comment: Performed at [facility], [place] [address], [place], [place] [address]  Basic metabolic panel     Status: Abnormal   Collection Time: 01/04/19  5:39 AM  Result Value Ref Range   Sodium 141 135 - 145 mmol/L   Potassium 4.6 3.5 - 5.1 mmol/L   Chloride 112 (H) 98 - 111 mmol/L   CO2 19 (L) 22 - 32  mmol/L   Glucose, Bld 96 70 - 99 mg/dL   BUN 73 (H) 8 - 23 mg/dL   Creatinine, Ser 2.79 (H) 0.61 - 1.24 mg/dL   Calcium 8.3 (L) 8.9 - 10.3 mg/dL   GFR calc non Af Amer 20 (L) >60 mL/min   GFR calc Af Amer 23 (L) >60 mL/min   Anion gap 10 5 - 15    Comment: Performed at [facility], [place] [address] [place], [place] [address]  CBC     Status: Abnormal   Collection Time: 01/05/19  7:35 AM  Result Value Ref Range   WBC 6.9 4.0 - 10.5 K/uL   RBC 3.01 (L) 4.22 - 5.81 MIL/uL   Hemoglobin 10.1 (L) 13.0 - 17.0 g/dL   HCT 31.9 (L) 39.0 - 52.0 %   MCV 106.0 (H) 80.0 - 100.0 fL   MCH 33.6 26.0 - 34.0 pg   MCHC 31.7 30.0 - 36.0 g/dL   RDW 14.5 11.5 - 15.5 %   Platelets 71 (L) 150 - 400 K/uL    Comment: Immature Platelet Fraction may be clinically indicated, consider ordering this additional test [id number] CONSISTENT WITH PREVIOUS RESULT    nRBC 0.0 0.0 - 0.2 %    Comment: Performed at [facility], [place] [address] [place], [place] [address]    Imaging / Studies: No results found.  Medications / Allergies: per chart  Antibiotics: Anti-infectives (From admission, onward)   Start     Dose/Rate Route Frequency Ordered Stop   01/02/19 1400  piperacillin-tazobactam (ZOSYN) IVPB 2.25 g  Status:  Discontinued     2.25 g 100 mL/hr over 30 Minutes Intravenous Every 8 hours 01/02/19 1223 01/02/19 1224   01/02/19 1400  piperacillin-tazobactam (ZOSYN) IVPB 2.25  g     2.25 g 100 mL/hr over 30 Minutes Intravenous Every 8 hours 01/02/19 1224 01/07/19 1359   01/02/19 0909  ceFAZolin (ANCEF) 2-4 GM/100ML-% IVPB  Status:  Discontinued    Note to Pharmacy:  Guerry Bruin   : cabinet override      01/02/19 0909 01/02/19 0916   01/02/19 0600  ceFAZolin (ANCEF) IVPB 2g/100 mL premix  Status:  Discontinued     2 g 200 mL/hr over 30 Minutes Intravenous On call to O.R. 01/01/19 1909 01/02/19 1218   01/02/19 0600  metroNIDAZOLE (FLAGYL) IVPB 500 mg     500 mg 100 mL/hr over 60 Minutes Intravenous On call to O.R. 01/01/19 1909 01/02/19 0836   01/01/19 1730  cefTRIAXone (ROCEPHIN) 2 g in sodium chloride 0.9 % 100 mL IVPB  Status:  Discontinued     2 g 200 mL/hr over 30 Minutes Intravenous  Once 01/01/19 1728 01/02/19 1218        Note: Portions of this report may have been transcribed using voice recognition software. Every effort was made to ensure accuracy; however, inadvertent computerized transcription errors may be present.   Any transcriptional errors that result from this process are unintentional.     Adin Hector, MD, FACS, MASCRS Gastrointestinal and Minimally Invasive Surgery    [address] [place], [place] [address] [phone] Main / Paging [phone] Fax

## 2019-01-05 NOTE — Progress Notes (Signed)
I have assumed care over this pt and agree with previous nurse's assessment. Pt in room with family. Will continue to monitor.
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
## 2019-01-05 NOTE — Progress Notes (Signed)
Patient Demographics:    Nicholas Porter, is a 85 y.o. male, DOB - Aug 02, 1930, OVF:[id number]  Admit date - 01/01/2019   Admitting Physician Alaria Oconnor Denton Brick, MD  Outpatient Primary MD for the patient is Dorothyann Peng, NP  LOS - 4   Chief Complaint  Patient presents with  . Abdominal Pain  . Nausea        Subjective:    Nicholas Porter today has no fevers, no emesis,  No chest pain, oral intake remains poor, wife at bedside, wife states patient also gets agitated at home at times, oral intake is not great  Assessment  & Plan :    Principal Problem:   Acute gangrenous cholecystitis s/p lap cholecystectomy 01/02/2019 Active Problems:   Essential hypertension   CAD S/P percutaneous coronary angioplasty - DES in RCA with PTCA for ISR; Moderate mLAD, 80% ostial OM2 stable   H/O Inferior STEMI (09/2013) emergent PCI with Promus DES to RCA (3.0 mm  x 28 mm - 3.2 mm)   GERD (gastroesophageal reflux disease)   Malnutrition of moderate degree   Benign prostatic hyperplasia   Unintentional weight loss   History of medication noncompliance   History of partial gastrectomy   Pill dysphagia   CKD (chronic kidney disease), stage III (HCC)   Protein-calorie malnutrition, severe  Brief Summary 85 y.o. male has medical history relevant for CAD with prior angioplasty and stenting, PAD with prior left carotid endodiathermy, COPD/ patient is a reformed smoker who quit smoking about 50 years ago as well as h/o partial gastrectomy/vagatomy in distant past for ulcer. LAst EGD 2009 with mild gastritis ,CKDIII , unintentional weight loss 60 lbs last year admitted on 01/01/2019 with abdominal pain and found to have calculus cholecystitis, status post lap chole and lysis of adhesions on 01/02/2019 by Dr. Johney Maine, patient subsequently developed AKI, JP drain with high output, HIDA scan on 01/05/2019 with active bile leak to the OR  on 01/06/2019   Plan:- 1)Acute calculus cholecystitis-----complicated by active bile leak , JP drain with high output, HIDA scan on 01/05/2019 with active bile leak to the OR on 01/06/2019, please see HIDA scan report leukocytosis has resolved, no fevers, general surgeon recommends total of 5 days of IV antibiotics, s/p  lap chole with lysis of adhesions on 01/02/2019 , surgical consult from Dr. Johney Maine appreciated,, -  (patient received IV Rocephin and Flagyl on 01/01/2019)----further management per surgical team, PRN pain meds PRN antiemetics  2)HTN--stage II, stable,  continue. Imdur 30 mg daily along with hydralazine 25 mg 3 times daily and metoprolol 25 mg twice daily,   may use IV labetalol when necessary  Every 4 hours for systolic blood pressure over 160 mmhg  3)Social/Ethics--- Patient and his wife both admit that he is a DNR, Health visitor verified DNR status  4)HFpEF--- patient with history of combined systolic and diastolic dysfunction CHF, repeat echo with preserved EF of 55 to 60% with grade 1 dCHF in the setting of known CAD and possible ischemic cardiomyopathy---   be judicious with IV fluids to avoid volume overload, continue metoprolol, isosorbide/hydralazine combo, , QTC on EKG is normalized  5)H/o CAD and PAD-- CAD with prior angioplasty and stenting, PAD with prior left carotid endarterecmy , patient apparently is  intolerant to Lipitor trial low-dose pravastatin, continue aspirin ----continue metoprolol, no ACS type symptoms at this time, EKG without ischemic changes, repeat echo with preserved EF of 55 to 60% with grade 1 dCHF  6)AKI----acute kidney injury on CKD stage - III due to dehydration in the setting of persistent nausea and abdominal pain with poor oral intake poor oral intake preop and postop and now compounded by very high output post cholecystectomy bile leak    creatinine on admission= 1.75,  ,   baseline creatinine = 1.9  (04/2018)   , creatinine is now= 2.65     , renally  adjust medications, avoid nephrotoxic agents/dehydration/hypotension , continue to hydrate  7)Anorexia and Weight Loss----consider GI consult for endoluminal evaluation (last colonoscopy apparently about 7 to 10 years ago), continue Remeron for appetite stimulation and sleep, nutritional consult appreciated, TSH was 2.4 in April 2019  8) COPD/emphysema----quit smoking about 50 years ago, may use PRN albuterol, no acute flareup of COPD at this time  9)Generalized weakness/debility--- PT eval appreciated, recommend SNF rehab  Disposition/Need for in-Hospital Stay- patient unable to be discharged at this time due to status post lap chole lysis evaluation 01/02/2019, patient needs IV antibiotics given significant gangrenous cholecystitis, also needs IV fluids for AKI, patient has active bile leak post cholecystectomy going back to the OR 01/06/2019 for surgical correction  Code Status : DNR  Family Communication:   wife  Disposition Plan  : SNF Rehab Consults  :  Gen surg  DVT Prophylaxis  :   SCD (low platelets  Lab Results  Component Value Date   PLT 71 (L) 01/05/2019    Inpatient Medications  Scheduled Meds: . acetaminophen  1,000 mg Oral TID  . aspirin EC  81 mg Oral Daily  . brinzolamide  1 drop Left Eye Q12H  . bupivacaine liposome  20 mL Infiltration Once  . calcium-vitamin D  1 tablet Oral QODAY  . Chlorhexidine Gluconate Cloth  6 each Topical Once  . feeding supplement  237 mL Oral BID BM  . hydrALAZINE  25 mg Oral TID  . isosorbide mononitrate  30 mg Oral Daily  . latanoprost  1 drop Both Eyes QHS  . lip balm  1 application Topical BID  . metoprolol tartrate  25 mg Oral BID  . mirtazapine  7.5 mg Oral QHS  . polyethylene glycol  17 g Oral BID  . pravastatin  20 mg Oral q1800   Continuous Infusions: . sodium chloride 100 mL/hr at 01/05/19 1936  . methocarbamol (ROBAXIN) IV    . ondansetron (ZOFRAN) IV    . piperacillin-tazobactam 2.25 g (01/05/19 1434)   PRN  Meds:.albuterol, alum & mag hydroxide-simeth, bisacodyl, fentaNYL (SUBLIMAZE) injection, guaiFENesin-dextromethorphan, hydrocortisone, hydrocortisone cream, labetalol, magic mouthwash, magnesium hydroxide, menthol-cetylpyridinium, methocarbamol (ROBAXIN) IV, ondansetron (ZOFRAN) IV **OR** ondansetron (ZOFRAN) IV, phenol, polyethylene glycol, prochlorperazine, traMADol, traZODone    Anti-infectives (From admission, onward)   Start     Dose/Rate Route Frequency Ordered Stop   01/02/19 1400  piperacillin-tazobactam (ZOSYN) IVPB 2.25 g  Status:  Discontinued     2.25 g 100 mL/hr over 30 Minutes Intravenous Every 8 hours 01/02/19 1223 01/02/19 1224   01/02/19 1400  piperacillin-tazobactam (ZOSYN) IVPB 2.25 g     2.25 g 100 mL/hr over 30 Minutes Intravenous Every 8 hours 01/02/19 1224 01/07/19 1359   01/02/19 0909  ceFAZolin (ANCEF) 2-4 GM/100ML-% IVPB  Status:  Discontinued    Note to Pharmacy:  Guerry Bruin   : cabinet override  01/02/19 0909 01/02/19 0916   01/02/19 0600  ceFAZolin (ANCEF) IVPB 2g/100 mL premix  Status:  Discontinued     2 g 200 mL/hr over 30 Minutes Intravenous On call to O.R. 01/01/19 1909 01/02/19 1218   01/02/19 0600  metroNIDAZOLE (FLAGYL) IVPB 500 mg     500 mg 100 mL/hr over 60 Minutes Intravenous On call to O.R. 01/01/19 1909 01/02/19 0836   01/01/19 1730  cefTRIAXone (ROCEPHIN) 2 g in sodium chloride 0.9 % 100 mL IVPB  Status:  Discontinued     2 g 200 mL/hr over 30 Minutes Intravenous  Once 01/01/19 1728 01/02/19 1218        Objective:   Vitals:   01/04/19 1253 01/04/19 2004 01/05/19 0639 01/05/19 1243  BP: (!) 153/83 137/67  (!) 165/77  Pulse: 66 79  87  Resp: (!) 22  18 18   Temp: 97.9 F (36.6 C) 98.5 F (36.9 C) 98 F (36.7 C) 98.2 F (36.8 C)  TempSrc: Oral Oral Oral Oral  SpO2: 100% 99% 97% 99%  Weight:      Height:        Wt Readings from Last 3 Encounters:  01/01/19 43.4 kg  10/21/18 45.8 kg  10/08/18 45.8 kg     Intake/Output  Summary (Last 24 hours) at 01/05/2019 2018 Last data filed at 01/05/2019 2009 Gross per 24 hour  Intake 1080.29 ml  Output 1272 ml  Net -191.71 ml   Physical Exam Patient is examined daily including today on 01/05/2019 , exams remain the same as of yesterday except that has changed   Gen:- Awake Alert, in no acute distress  HEENT:- [place].AT, No sclera icterus Neck-Supple Neck,No JVD,.  Lungs-diminished in bases, no significant adventitious sounds  CV- S1, S2 normal, regular, 3/6 systolic murmur Abd-  +ve B.Sounds, Abd Soft, appropriate postop tenderness, JP drain with significant amount of serosanguineous drainage Extremity/Skin:- No  edema, pedal pulses present = Psych-affect is appropriate, oriented x3 (episodes of sundowning on intermittent confusion and disorientation) Neuro-generalized weakness , but no new focal deficits, no tremors   Data Review:   Micro Results Recent Results (from the past 240 hour(s))  Blood culture (routine x 2)     Status: None (Preliminary result)   Collection Time: 01/01/19  7:58 PM  Result Value Ref Range Status   Specimen Description   Final    BLOOD LEFT ANTECUBITAL Performed at [facility] [address], [place], [place] [address]    Special Requests   Final    BOTTLES DRAWN AEROBIC AND ANAEROBIC Blood Culture adequate volume Performed at [facility] [address], [place], [place] [address]    Culture   Final    NO GROWTH 4 DAYS Performed at [facility], [place] [address], [place], [place] [address]    Report Status PENDING  Incomplete  Blood culture (routine x 2)     Status: None (Preliminary result)   Collection Time: 01/01/19  7:58 PM  Result Value Ref Range Status   Specimen Description BLOOD LEFT FOREARM  Final   Special Requests   Final    BOTTLES DRAWN AEROBIC AND ANAEROBIC Blood Culture adequate volume Performed at [facility] [address] [place], [place]  [address]    Culture   Final    NO GROWTH 4 DAYS Performed at [facility], [place] [address], [place], [place] [address]    Report Status PENDING  Incomplete  Surgical pcr  screen     Status: None   Collection Time: 01/02/19  5:57 AM  Result Value Ref Range Status   MRSA, PCR NEGATIVE NEGATIVE Final   Staphylococcus aureus NEGATIVE NEGATIVE Final    Comment: (NOTE) The Xpert SA Assay (FDA approved for NASAL specimens in patients 15 years of age and older), is one component of a comprehensive surveillance program. It is not intended to diagnose infection nor to guide or monitor treatment. Performed at [facility], [place] [address] [place], [place] [address]    Radiology Reports Ct Abdomen Pelvis Wo Contrast  Result Date: 01/01/2019 CLINICAL DATA:  85 year old male with history of left lower quadrant abdominal pain radiating to the back. EXAM: CT ABDOMEN AND PELVIS WITHOUT CONTRAST TECHNIQUE: Multidetector CT imaging of the abdomen and pelvis was performed following the standard protocol without IV contrast. COMPARISON:  CT the abdomen and pelvis 05/07/2018. FINDINGS: Lower chest: Aortic atherosclerosis. Calcified atherosclerotic plaque in the right coronary artery. Calcifications of the mitral annulus. Surgical clips adjacent to the gastroesophageal junction. Hepatobiliary: No definite cystic or solid hepatic lesions are confidently identified on today's noncontrast CT examination. Small calcified gallstones in the gallbladder. No findings to suggest an acute cholecystitis at this time. Pancreas: No definite pancreatic mass or peripancreatic fluid or inflammatory changes are noted on today's noncontrast CT examination. Spleen: Unremarkable. Adrenals/Urinary Tract: There are no abnormal calcifications within the collecting system of either kidney, along the course of either ureter, or within the lumen of the urinary bladder. No hydroureteronephrosis or perinephric stranding to  suggest urinary tract obstruction at this time. The unenhanced appearance of the kidneys is unremarkable bilaterally. Unenhanced appearance of the urinary bladder is normal. Bilateral adrenal glands are normal in appearance. Stomach/Bowel: Unenhanced appearance of the stomach is normal. No pathologic dilatation of small bowel or colon. Numerous colonic diverticulae are noted, without surrounding inflammatory changes to suggest an acute diverticulitis at this time. The appendix is not confidently identified and may be surgically absent. Regardless, there are no inflammatory changes noted adjacent to the cecum to suggest the presence of an acute appendicitis at this time. Vascular/Lymphatic: Aortic atherosclerosis. No lymphadenopathy confidently identified in the abdomen or pelvis on today's noncontrast CT examination. Reproductive: Prostate gland and seminal vesicles are unremarkable in appearance. Other: No significant volume of ascites.  No pneumoperitoneum. Musculoskeletal: There are no aggressive appearing lytic or blastic lesions noted in the visualized portions of the skeleton. IMPRESSION: 1. No acute findings are noted in the abdomen or pelvis to account for the patient's symptoms. 2. Severe colonic diverticulosis without definite surrounding inflammatory changes to indicate an acute diverticulitis at this time. 3. Cholelithiasis without evidence of acute cholecystitis. 4. Aortic atherosclerosis, in addition to least right coronary artery disease. 5. There are calcifications of the mitral annulus. Echocardiographic correlation for evaluation of potential valvular dysfunction may be warranted if clinically indicated. Electronically Signed   By: Vinnie Langton M.D.   On: 01/01/2019 14:36   Dg Chest 2 View  Result Date: 01/01/2019 CLINICAL DATA:  Medical clearance for cholecystitis EXAM: CHEST - 2 VIEW COMPARISON:  03/27/2018 FINDINGS: Emphysematous hyperinflation of the lungs without acute pulmonary  consolidation. Normal heart size and mediastinal contours with aortic atherosclerosis. Vascular clip projects over the base of the neck on the left. 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 IMPRESSION: Emphysematous hyperinflation of the lungs without acute pulmonary disease. Electronically Signed   By: Meredith Leeds.D.  On: 01/01/2019 18:03   Dg Chest Port 1 View  Result Date: 01/02/2019 CLINICAL DATA:  85 y/o  M; unresponsiveness. EXAM: PORTABLE CHEST 1 VIEW COMPARISON:  01/01/2019 chest radiograph FINDINGS: Normal cardiac silhouette. Aortic atherosclerosis with calcification. Clear lungs. No pleural effusion or pneumothorax. No acute osseous abnormality is evident. Surgical clips project over the gastroesophageal junction. IMPRESSION: No acute pulmonary process identified. Electronically Signed   By: Kristine Garbe M.D.   On: 01/02/2019 06:11   Nm Hepatobiliary Including Gb  Result Date: 01/05/2019 CLINICAL DATA:  Laparoscopic cholecystectomy. Cholecystectomy 01/02/2019. Bilious drain output. EXAM: NUCLEAR MEDICINE HEPATOBILIARY IMAGING TECHNIQUE: Sequential images of the abdomen were obtained out to 60 minutes following intravenous administration of radiopharmaceutical. RADIOPHARMACEUTICALS:  5.0 mCi Tc-23m  Choletec IV COMPARISON:  CT and ultrasound 01/01/2019 FINDINGS: Prompt clearance of radiotracer from the blood pool and homogeneous uptake in the liver. Excreted radiotracer quickly fills the common hepatic duct and extends into the gallbladder fossa. Radiotracer extends along the RIGHT hepatic margin and into RIGHT lower quadrant peritoneal space. Findings consistent with active bile leak. No counts evident within the bowel. IMPRESSION: 1. Active bile leak with excreted radiotracer filling the gallbladder fossa and extending into the RIGHT lower quadrant peritoneal space. 2. No significant counts transit to  the small bowel. These results will be called to the ordering clinician or representative by the Radiologist Assistant, and communication documented in the PACS or zVision Dashboard. Electronically Signed   By: Suzy Bouchard M.D.   On: 01/05/2019 12:13   US Abdomen Limited Ruq  Result Date: 01/01/2019 CLINICAL DATA:  Upper abdominal pain EXAM: ULTRASOUND ABDOMEN LIMITED RIGHT UPPER QUADRANT COMPARISON:  CT from earlier in the same day. FINDINGS: Gallbladder: Gallbladder is well distended with wall thickening to 5.7 mm and edema within the gallbladder wall. Gallstones are noted. Positive sonographic Murphy's sign is noted. Common bile duct: Diameter: 10 mm Liver: Liver is within normal limits with the exception of a 1 cm hyperechoic focus within the left lobe consistent with a small hemangioma. Portal vein is patent on color Doppler imaging with normal direction of blood flow towards the liver. IMPRESSION: Changes consistent with acute calculous cholecystitis in the appropriate clinical setting. Hyperechoic focus within the left lobe of the liver most consistent with small hemangioma. Electronically Signed   By: Inez Catalina M.D.   On: 01/01/2019 16:34    CBC Recent Labs  Lab 01/01/19 1150 01/02/19 0458 01/03/19 1012 01/05/19 0735  WBC 11.2* 19.8* 8.5 6.9  HGB 13.0 12.9* 11.3* 10.1*  HCT 38.5* 38.7* 35.0* 31.9*  PLT 92* 89* 60* 71*  MCV 97.5 100.5* 104.8* 106.0*  MCH 32.9 33.5 33.8 33.6  MCHC 33.8 33.3 32.3 31.7  RDW 13.1 13.7 14.1 14.5  LYMPHSABS 0.4*  --  0.6*  --   MONOABS 1.0  --  0.4  --   EOSABS 0.0  --  0.0  --   BASOSABS 0.0  --  0.0  --     Chemistries  Recent Labs  Lab 01/01/19 1150  01/02/19 1335 01/03/19 1012 01/04/19 0539 01/05/19 0735 01/05/19 0930  NA 142  --  141 138 141 145 146*  K 4.7  --  4.9 4.5 4.6 4.3 5.0  CL 108  --  107 105 112* 117* 118*  CO2 23  --  21* 22 19* 17* 18*  GLUCOSE 152*  --  148* 115* 96 123* 119*  BUN 46*  --  59* 67* 73* 73* 73*  CREATININE 1.75*   < > 2.33* 2.70* 2.79* 2.63* 2.65*  CALCIUM 9.4  --  8.6* 8.3* 8.3* 8.3* 8.5*  MG  --   --  1.6* 1.8  --   --   --   AST 20  --  98*  --   --  19 18  ALT 9  --  69*  --   --  15 14  ALKPHOS 90  --  75  --   --  49 49  BILITOT 1.4*  --  0.9  --   --  0.9 0.9   < > = values in this interval not displayed.    Lab Results  Component Value Date   HGBA1C 5.3 10/05/2013   ----------------------------------------------------------------------------------------------------------------- No results for input(s): VITAMINB12, FOLATE, FERRITIN, TIBC, IRON, RETICCTPCT in the last 72 hours. Cardiac Enzymes Recent Labs  Lab 01/02/19 0514 01/02/19 1335 01/02/19 2000  TROPONINI 0.05* 0.06* 0.05*   Roxan Hockey M.D on 01/05/2019 at 8:18 PM  Go to [email or web address] - for contact info  [facility] - Office  [phone]

## 2019-01-06 ENCOUNTER — Encounter (HOSPITAL_COMMUNITY): Payer: Self-pay | Admitting: Anesthesiology

## 2019-01-06 ENCOUNTER — Inpatient Hospital Stay (HOSPITAL_COMMUNITY): Payer: Medicare Other

## 2019-01-06 ENCOUNTER — Inpatient Hospital Stay (HOSPITAL_COMMUNITY): Payer: Medicare Other | Admitting: Anesthesiology

## 2019-01-06 ENCOUNTER — Encounter (HOSPITAL_COMMUNITY)
Admission: EM | Disposition: A | Discharge status: Skilled Nursing Facility | Payer: Self-pay | Source: Home / Self Care | Attending: Internal Medicine

## 2019-01-06 HISTORY — PX: SPHINCTEROTOMY: SHX5279

## 2019-01-06 HISTORY — PX: ERCP: SHX5425

## 2019-01-06 HISTORY — PX: BILIARY STENT PLACEMENT: SHX5538

## 2019-01-06 LAB — HEPATIC FUNCTION PANEL
ALT: 13 U/L (ref 0–44)
AST: 22 U/L (ref 15–41)
Albumin: 2.5 g/dL — ABNORMAL LOW (ref 3.5–5.0)
Alkaline Phosphatase: 59 U/L (ref 38–126)
BILIRUBIN INDIRECT: 0.8 mg/dL (ref 0.3–0.9)
Bilirubin, Direct: 0.3 mg/dL — ABNORMAL HIGH (ref 0.0–0.2)
TOTAL PROTEIN: 5.5 g/dL — AB (ref 6.5–8.1)
Total Bilirubin: 1.1 mg/dL (ref 0.3–1.2)

## 2019-01-06 LAB — CULTURE, BLOOD (ROUTINE X 2)
Culture: NO GROWTH
Culture: NO GROWTH
SPECIAL REQUESTS: ADEQUATE
Special Requests: ADEQUATE

## 2019-01-06 LAB — CREATININE, SERUM
Creatinine, Ser: 2.61 mg/dL — ABNORMAL HIGH (ref 0.61–1.24)
GFR calc non Af Amer: 21 mL/min — ABNORMAL LOW (ref >60)
GFR, EST AFRICAN AMERICAN: 25 mL/min — AB (ref >60)

## 2019-01-06 LAB — POTASSIUM: Potassium: 4.9 mmol/L (ref 3.5–5.1)

## 2019-01-06 SURGERY — ERCP, WITH INTERVENTION IF INDICATED
Anesthesia: General

## 2019-01-06 MED ORDER — ACETAMINOPHEN 650 MG RE SUPP: 650.0000 mg | RECTAL | Status: DC | PRN

## 2019-01-06 MED ORDER — LORAZEPAM 2 MG/ML IJ SOLN
1.0000 mg | Freq: Once | INTRAMUSCULAR | Status: AC
  Administered 2019-01-06: 1 mg via INTRAVENOUS
  Filled 2019-01-06: qty 1

## 2019-01-06 MED ORDER — EPHEDRINE SULFATE-NACL 50-0.9 MG/10ML-% IV SOSY
PREFILLED_SYRINGE | INTRAVENOUS | Status: DC | PRN
  Administered 2019-01-06: 5 mg via INTRAVENOUS

## 2019-01-06 MED ORDER — ACETAMINOPHEN 80 MG RE SUPP: 80.0000 mg | Freq: Four times a day (QID) | RECTAL | Status: DC | PRN

## 2019-01-06 MED ORDER — SODIUM CHLORIDE 0.9 % IV SOLN
INTRAVENOUS | Status: DC | PRN
  Administered 2019-01-06: 10 mL

## 2019-01-06 MED ORDER — PROPOFOL 10 MG/ML IV BOLUS
INTRAVENOUS | Status: DC | PRN
  Administered 2019-01-06: 80 mg via INTRAVENOUS

## 2019-01-06 MED ORDER — PROPOFOL 10 MG/ML IV BOLUS
INTRAVENOUS | Status: AC
  Filled 2019-01-06: qty 20

## 2019-01-06 MED ORDER — INDOMETHACIN 50 MG RE SUPP
RECTAL | Status: AC
  Filled 2019-01-06: qty 2

## 2019-01-06 MED ORDER — FENTANYL CITRATE (PF) 100 MCG/2ML IJ SOLN
INTRAMUSCULAR | Status: AC
  Filled 2019-01-06: qty 2

## 2019-01-06 MED ORDER — HYDRALAZINE HCL 20 MG/ML IJ SOLN
5.0000 mg | INTRAMUSCULAR | Status: DC | PRN
  Administered 2019-01-10 (×2): 5 mg via INTRAVENOUS
  Filled 2019-01-06 (×2): qty 1

## 2019-01-06 MED ORDER — FENTANYL CITRATE (PF) 100 MCG/2ML IJ SOLN
INTRAMUSCULAR | Status: DC | PRN
  Administered 2019-01-06 (×2): 25 ug via INTRAVENOUS

## 2019-01-06 MED ORDER — GLUCAGON HCL RDNA (DIAGNOSTIC) 1 MG IJ SOLR
INTRAMUSCULAR | Status: AC
  Filled 2019-01-06: qty 1

## 2019-01-06 MED ORDER — LIDOCAINE 2% (20 MG/ML) 5 ML SYRINGE
INTRAMUSCULAR | Status: DC | PRN
  Administered 2019-01-06: 40 mg via INTRAVENOUS

## 2019-01-06 MED ORDER — LACTATED RINGERS IV SOLN: INTRAVENOUS | Status: DC | PRN

## 2019-01-06 MED ORDER — SUCCINYLCHOLINE CHLORIDE 200 MG/10ML IV SOSY
PREFILLED_SYRINGE | INTRAVENOUS | Status: DC | PRN
  Administered 2019-01-06: 100 mg via INTRAVENOUS

## 2019-01-06 NOTE — Progress Notes (Signed)
SLP Cancellation Note  Patient Details Name: Nicholas Porter MRN: [id number] DOB: [birth year]   Cancelled treatment:       Reason Eval/Treat Not Completed: Other (comment)(spoke to RN who reports pt continues to be lethargic at this time, will continue efforts)   Macario Golds 01/06/2019, 12:46 PM  Luanna Salk, [facility] SLP [place] Pager [phone] Office [phone]

## 2019-01-06 NOTE — Transfer of Care (Signed)
Immediate Anesthesia Transfer of Care Note  Patient: Nicholas Porter  Procedure(s) Performed: Procedure(s): ENDOSCOPIC RETROGRADE CHOLANGIOPANCREATOGRAPHY (ERCP) (N/A) SPHINCTEROTOMY BILIARY STENT PLACEMENT (N/A)  Patient Location: PACU  Anesthesia Type:General  Level of Consciousness:  sedated, patient cooperative and responds to stimulation  Airway & Oxygen Therapy:Patient Spontanous Breathing and Patient connected to face mask oxgen  Post-op Assessment:  Report given to PACU RN and Post -op Vital signs reviewed and stable  Post vital signs:  Reviewed and stable  Last Vitals:  Vitals:   01/05/19 2133 01/06/19 0750  BP: (!) 161/67 (!) 173/63  Pulse: 78 76  Resp: 18 13  Temp: 36.9 C 36.7 C  SpO2: 67% 54%    Complications: No apparent anesthesia complications

## 2019-01-06 NOTE — Anesthesia Postprocedure Evaluation (Signed)
Anesthesia Post Note  Patient: Nicholas Porter  Procedure(s) Performed: ENDOSCOPIC RETROGRADE CHOLANGIOPANCREATOGRAPHY (ERCP) (N/A ) SPHINCTEROTOMY BILIARY STENT PLACEMENT (N/A )     Patient location during evaluation: Endoscopy Anesthesia Type: General Level of consciousness: awake and alert Pain management: pain level controlled Vital Signs Assessment: post-procedure vital signs reviewed and stable Respiratory status: spontaneous breathing, nonlabored ventilation and respiratory function stable Cardiovascular status: blood pressure returned to baseline and stable Postop Assessment: no apparent nausea or vomiting Anesthetic complications: no    Last Vitals:  Vitals:   01/06/19 1005 01/06/19 1010  BP:  (!) 192/92  Pulse: 79 83  Resp: 18 (!) 22  Temp:    SpO2: 100% 100%    Last Pain:  Vitals:   01/06/19 0955  TempSrc:   PainSc: Asleep                 Lynda Rainwater

## 2019-01-06 NOTE — Progress Notes (Signed)
PT Cancellation Note  Patient Details Name: Nicholas Porter MRN: [id number] DOB: 06-08-1930   Cancelled Treatment:    Reason Eval/Treat Not Completed: Fatigue/lethargy limiting ability to participate. S/p ERCP earlier today. Will check back another day   Weston Anna, [place] Pager: [phone] Office: [phone]

## 2019-01-06 NOTE — Progress Notes (Signed)
PROGRESS NOTE    Nicholas Porter  EXH:[id number] DOB: December 26, 1929 DOA: 01/01/2019 PCP: Dorothyann Peng, NP  Brief Narrative: 85 y.o.malehas medical history relevant for CAD with prior angioplasty and stenting, PAD with prior left carotid endodiathermy, COPD/patient is a reformed smoker who quit smoking about 50 years ago as well ash/o partial gastrectomy/vagatomy in distant past for ulcer. LAst EGD 2009 with mild gastritis ,CKDIII, unintentional weight loss 60 lbs last year admitted on 01/01/2019 with abdominal pain and found to have calculus cholecystitis, status post lap chole and lysis of adhesions on 01/02/2019 by Dr. Johney Maine, patient subsequently developed AKI, JP drain with high output, HIDA scan on 01/05/2019 with active bile leak to the OR on 01/06/2019   Assessment & Plan:   Principal Problem:   Acute gangrenous cholecystitis s/p lap cholecystectomy 01/02/2019 Active Problems:   Essential hypertension   CAD S/P percutaneous coronary angioplasty - DES in RCA with PTCA for ISR; Moderate mLAD, 80% ostial OM2 stable   H/O Inferior STEMI (09/2013) emergent PCI with Promus DES to RCA (3.0 mm  x 28 mm - 3.2 mm)   GERD (gastroesophageal reflux disease)   Malnutrition of moderate degree   Benign prostatic hyperplasia   Unintentional weight loss   History of medication noncompliance   History of partial gastrectomy   Pill dysphagia   CKD (chronic kidney disease), stage III (HCC)   Protein-calorie malnutrition, severe   1)Acute calculus cholecystitis-----complicated by active bile leak , JP drain with high output, HIDA scan on 01/05/2019 with active bile leak to the OR on 01/06/2019, please see HIDA scan report leukocytosis has resolved, no fevers, general surgeon recommends total of 5 days of IV antibiotics, s/p  lap chole with lysis of adhesions on 01/02/2019 , surgical consult from Dr.Gross appreciated,, -  (patient received IV Rocephin and Flagyl on 01/01/2019)----further management per  surgical team, PRN pain meds PRN antiemetics  S/P ERCP sphinctererotomy and stent in CBD.  2)HTN--stage II, stable,  continue. Imdur 30 mg daily along with hydralazine 25 mg 3 times daily andmetoprolol 25 mg twice daily, may use IV labetalol when necessary Every 4 hours for systolic blood pressure over 160 mmhg.prn hydralazine iv while npo.  4)HFpEF---patient with history of combined systolic and diastolic dysfunction CHF, repeat echo with preserved EF of 55 to 60% with grade 1 dCHF in the setting of known CAD and possible ischemic cardiomyopathy---be judicious with IV fluids to avoid volume overload, continue metoprolol, isosorbide/hydralazine combo, , QTC on EKG is normalized  5)H/o CAD and PAD--CAD with prior angioplasty and stenting, PAD with prior left carotid endarterecmy ,patient apparently is intolerant to Lipitor trial low-dose pravastatin, continue aspirin ----continue metoprolol, no ACS type symptoms at this time, EKG without ischemic changes, repeat echo with preserved EF of 55 to 60% with grade 1 dCHF    6)AKI----acute kidney injury on CKD stage - III due to dehydration in the setting of persistent nausea and abdominal pain with poor oral intake poor oral intake preop and postop and now compounded by very high output post cholecystectomy bile leak    creatinine on admission= 1.75,  ,   baseline creatinine = 1.9  (04/2018)   , creatinine is now= 2.61  , renally adjust medications, avoid nephrotoxic agents/dehydration/hypotension , continue to hydrate  7)Anorexia and Weight Loss----consider GI consult for endoluminal evaluation (last colonoscopy apparently about 7 to 10 years ago), continue Remeron for appetite stimulation and sleep,nutritional consult appreciated, TSH was 2.4 in April 2019  8)COPD/emphysema----quit smoking about 50 years  ago, may use PRN albuterol, no acute flareup of COPD at this time  9)Generalized weakness/debility--- PT eval appreciated, recommend SNF  rehab     Nutrition Problem: Severe Malnutrition Etiology: chronic illness(COPD, CKD, CHF)     Signs/Symptoms: severe fat depletion, severe muscle depletion, energy intake < or equal to 75% for > or equal to 1 month    Interventions: Ensure Surgery, Magic cup  Estimated body mass index is 14.99 kg/m as calculated from the following:   Height as of this encounter: 5\' 7"  (1.702 m).   Weight as of this encounter: 43.4 kg.  DVT prophylaxis: scd Code Status: dnr Family Communication: dw family Disposition Plan:pending clinical improvement  Consultants: gi general surgery  Procedures: ercp Antimicrobials: none Subjective:sleeping after surgery  Objective:confused after surgery Vitals:   01/06/19 0955 01/06/19 1005 01/06/19 1010 01/06/19 1025  BP: (!) 175/79  (!) 192/92 (!) 170/105  Pulse:  79 83 86  Resp: (!) 21 18 (!) 22 20  Temp:    97.7 F (36.5 C)  TempSrc:    Axillary  SpO2: 100% 100% 100% 100%  Weight:      Height:        Intake/Output Summary (Last 24 hours) at 01/06/2019 1321 Last data filed at 01/06/2019 0936 Gross per 24 hour  Intake 3023.56 ml  Output 1540 ml  Net 1483.56 ml   Filed Weights   01/01/19 1132 01/01/19 2020  Weight: 45.4 kg 43.4 kg    Examination:  General exam: Appears calm and comfortable  Respiratory system: Clear to auscultation. Respiratory effort normal. Cardiovascular system: S1 & S2 heard, RRR. No JVD, murmurs, rubs, gallops or clicks. No pedal edema. Gastrointestinal system: Abdomen is nondistended, soft and nontender. No organomegaly or masses felt. Normal bowel sounds heard.JP drain in place Central nervous system: Alert and oriented. No focal neurological deficits. Extremities: Symmetric 5 x 5 power. Skin: No rashes, lesions or ulcers Psychiatry: Judgement and insight appear normal. Mood & affect appropriate.     Data Reviewed: I have personally reviewed following labs and imaging studies  CBC: Recent Labs  Lab  01/01/19 1150 01/02/19 0458 01/03/19 1012 01/05/19 0735  WBC 11.2* 19.8* 8.5 6.9  NEUTROABS 9.7*  --  7.5  --   HGB 13.0 12.9* 11.3* 10.1*  HCT 38.5* 38.7* 35.0* 31.9*  MCV 97.5 100.5* 104.8* 106.0*  PLT 92* 89* 60* 71*   Basic Metabolic Panel: Recent Labs  Lab 01/02/19 1335 01/03/19 1012 01/04/19 0539 01/05/19 0735 01/05/19 0930 01/06/19 0531  NA 141 138 141 145 146*  --   K 4.9 4.5 4.6 4.3 5.0 4.9  CL 107 105 112* 117* 118*  --   CO2 21* 22 19* 17* 18*  --   GLUCOSE 148* 115* 96 123* 119*  --   BUN 59* 67* 73* 73* 73*  --   CREATININE 2.33* 2.70* 2.79* 2.63* 2.65* 2.61*  CALCIUM 8.6* 8.3* 8.3* 8.3* 8.5*  --   MG 1.6* 1.8  --   --   --   --    GFR: Estimated Creatinine Clearance: 12.5 mL/min (A) (by C-G formula based on SCr of 2.61 mg/dL (H)). Liver Function Tests: Recent Labs  Lab 01/01/19 1150 01/02/19 1335 01/05/19 0735 01/05/19 0930 01/06/19 0531  AST 20 98* 19 18 22   ALT 9 69* 15 14 13   ALKPHOS 90 75 49 49 59  BILITOT 1.4* 0.9 0.9 0.9 1.1  PROT 7.3 6.4* 5.5* 5.7* 5.5*  ALBUMIN 4.3 3.7 2.7* 2.8* 2.5*  Recent Labs  Lab 01/01/19 1150 01/05/19 0930  LIPASE 41 76*   No results for input(s): AMMONIA in the last 168 hours. Coagulation Profile: No results for input(s): INR, PROTIME in the last 168 hours. Cardiac Enzymes: Recent Labs  Lab 01/02/19 0514 01/02/19 1335 01/02/19 2000  TROPONINI 0.05* 0.06* 0.05*   BNP (last 3 results) No results for input(s): PROBNP in the last 8760 hours. HbA1C: No results for input(s): HGBA1C in the last 72 hours. CBG: Recent Labs  Lab 01/02/19 0503 01/02/19 1131  GLUCAP 142* 148*   Lipid Profile: No results for input(s): CHOL, HDL, LDLCALC, TRIG, CHOLHDL, LDLDIRECT in the last 72 hours. Thyroid Function Tests: No results for input(s): TSH, T4TOTAL, FREET4, T3FREE, THYROIDAB in the last 72 hours. Anemia Panel: No results for input(s): VITAMINB12, FOLATE, FERRITIN, TIBC, IRON, RETICCTPCT in the last 72  hours. Sepsis Labs: No results for input(s): PROCALCITON, LATICACIDVEN in the last 168 hours.  Recent Results (from the past 240 hour(s))  Blood culture (routine x 2)     Status: None   Collection Time: 01/01/19  7:58 PM  Result Value Ref Range Status   Specimen Description   Final    BLOOD LEFT ANTECUBITAL Performed at [facility] [address], [place], [place] [address]    Special Requests   Final    BOTTLES DRAWN AEROBIC AND ANAEROBIC Blood Culture adequate volume Performed at [facility] [address], [place], [place] [address]    Culture   Final    NO GROWTH 5 DAYS Performed at [facility], [place] [address] [place], [place] [address]    Report Status 01/06/2019 FINAL  Final  Blood culture (routine x 2)     Status: None   Collection Time: 01/01/19  7:58 PM  Result Value Ref Range Status   Specimen Description BLOOD LEFT FOREARM  Final   Special Requests   Final    BOTTLES DRAWN AEROBIC AND ANAEROBIC Blood Culture adequate volume Performed at [facility] [address], [place], [place] [address]    Culture   Final    NO GROWTH 5 DAYS Performed at [facility], [place] [address] [place], [place] [address]    Report Status 01/06/2019 FINAL  Final  Surgical pcr screen     Status: None   Collection Time: 01/02/19  5:57 AM  Result Value Ref Range Status   MRSA, PCR NEGATIVE NEGATIVE Final   Staphylococcus aureus NEGATIVE NEGATIVE Final    Comment: (NOTE) The Xpert SA Assay (FDA approved for NASAL specimens in patients 93 years of age and older), is one component of a comprehensive surveillance program. It is not intended to diagnose infection nor to guide or monitor treatment. Performed at [facility], [place] [address] [place],  [address]          Radiology Studies: Dg Ercp  Result Date: 01/06/2019 CLINICAL DATA:  Bile duct leak. EXAM: ERCP TECHNIQUE: Multiple  spot images obtained with the fluoroscopic device and submitted for interpretation post-procedure. COMPARISON:  right upper quadrant abdominal ultrasound - 01/01/2019; CT abdomen and pelvis - 01/01/2019; nuclear medicine HIDA scan - 01/05/2019 FINDINGS: Four spot intraoperative fluoroscopic images of the right upper abdominal quadrant during ERCP are provided for review Initial image demonstrates an ERCP probe overlying the right upper abdominal quadrant. There is selective cannulation and opacification of the common bile duct. Multiple surgical clips overlie the expected location of the gallbladder fossa.  There is a large bore surgical drainage catheter which also overlies expected location of the gallbladder fossa and caudal aspect of the right lobe of the liver. Subsequent images demonstrate further opacification of the common bile duct which appears moderately dilated. Ultimately there is opacification of the cystic duct with extravasation of contrast adjacent to the cholecystectomy clips and surgical drainage catheter. Completion image demonstrates placement of a internal biliary stent traversing the confluence of the cystic duct. IMPRESSION: ERCP with findings suggestive of residual cystic duct bile leak with subsequent placement of an internal biliary stent. These images were submitted for radiologic interpretation only. Please see the procedural report for the amount of contrast and the fluoroscopy time utilized. Electronically Signed   By: Sandi Mariscal M.D.   On: 01/06/2019 10:02   Nm Hepatobiliary Including Gb  Result Date: 01/05/2019 CLINICAL DATA:  Laparoscopic cholecystectomy. Cholecystectomy 01/02/2019. Bilious drain output. EXAM: NUCLEAR MEDICINE HEPATOBILIARY IMAGING TECHNIQUE: Sequential images of the abdomen were obtained out to 60 minutes following intravenous administration of radiopharmaceutical. RADIOPHARMACEUTICALS:  5.0 mCi Tc-53m  Choletec IV COMPARISON:  CT and ultrasound 01/01/2019  FINDINGS: Prompt clearance of radiotracer from the blood pool and homogeneous uptake in the liver. Excreted radiotracer quickly fills the common hepatic duct and extends into the gallbladder fossa. Radiotracer extends along the RIGHT hepatic margin and into RIGHT lower quadrant peritoneal space. Findings consistent with active bile leak. No counts evident within the bowel. IMPRESSION: 1. Active bile leak with excreted radiotracer filling the gallbladder fossa and extending into the RIGHT lower quadrant peritoneal space. 2. No significant counts transit to the small bowel. These results will be called to the ordering clinician or representative by the Radiologist Assistant, and communication documented in the PACS or zVision Dashboard. Electronically Signed   By: Suzy Bouchard M.D.   On: 01/05/2019 12:13        Scheduled Meds: . acetaminophen  1,000 mg Oral TID  . aspirin EC  81 mg Oral Daily  . brinzolamide  1 drop Left Eye Q12H  . bupivacaine liposome  20 mL Infiltration Once  . calcium-vitamin D  1 tablet Oral QODAY  . Chlorhexidine Gluconate Cloth  6 each Topical Once  . feeding supplement  237 mL Oral BID BM  . hydrALAZINE  25 mg Oral TID  . isosorbide mononitrate  30 mg Oral Daily  . latanoprost  1 drop Both Eyes QHS  . lip balm  1 application Topical BID  . metoprolol tartrate  25 mg Oral BID  . mirtazapine  7.5 mg Oral QHS  . polyethylene glycol  17 g Oral BID  . pravastatin  20 mg Oral q1800   Continuous Infusions: . sodium chloride 100 mL/hr at 01/06/19 0603  . methocarbamol (ROBAXIN) IV    . ondansetron (ZOFRAN) IV    . piperacillin-tazobactam 2.25 g (01/06/19 0604)     LOS: 5 days     Georgette Shell, MD [facility]  If 7PM-7AM, please contact night-coverage [email or web address] Password [email or web address] 01/06/2019, 1:21 PM

## 2019-01-06 NOTE — Op Note (Signed)
[facility] Patient Name: Nicholas Porter Procedure Date: 01/06/2019 MRN: [id number] Attending MD: Clarene Essex , MD Date of Birth: [birth year] CSN: [id number] Age: 85 Admit Type: Inpatient Procedure:                ERCP Indications:              Bile leak Providers:                Clarene Essex, MD, Elna Breslow, RN, Charolette Child,                            Technician, Arnoldo Hooker, CRNA Referring MD:              Medicines:                General Anesthesia Complications:            No immediate complications. Estimated Blood Loss:     Estimated blood loss: none. Procedure:                Pre-Anesthesia Assessment:                           - Prior to the procedure, a History and Physical                            was performed, and patient medications and                            allergies were reviewed. The patient's tolerance of                            previous anesthesia was also reviewed. The risks                            and benefits of the procedure and the sedation                            options and risks were discussed with the patient.                            All questions were answered, and informed consent                            was obtained. Prior Anticoagulants: The patient has                            taken aspirin, last dose was 1 day prior to                            procedure. ASA Grade Assessment: III - A patient                            with severe systemic disease. After reviewing the  risks and benefits, the patient was deemed in                            satisfactory condition to undergo the procedure.                           After obtaining informed consent, the scope was                            passed under direct vision. Throughout the                            procedure, the patient's blood pressure, pulse, and                            oxygen saturations were monitored continuously. The                             TJF-Q180V ([id number] Olympus duodenoscope was                            introduced through the mouth, and used to inject                            contrast into and used to cannulate the bile duct.                            The ERCP was accomplished without difficulty. The                            patient tolerated the procedure well. Scope In: Scope Out: Findings:      The major papilla was normal. With some difficulty due to positioning       deep selective cannulation was obtained and on initial cholangiogram       obvious cystic duct leak was confirmed and we proceeded with a biliary       sphincterotomy was made with a Hydratome sphincterotome using ERBE       electrocautery. We proceeded with a small to medium size sphincterotomy       only until we had adequate biliary drainage and there was no       post-sphincterotomy bleeding. One 10 Fr by 7 cm plastic stent with a       single external flap and a single internal flap was placed 6 cm into the       common bile duct. The stent was in good position. There was adequate       biliary drainage and there was no pancreatic duct injection or wire       advancement throughout the procedure Impression:               - The major papilla appeared normal.                           - A biliary sphincterotomy was performed.                           -  One plastic stent was placed into the common bile                            duct. There was no pancreatic duct injection or                            wire advancement throughout the procedure Moderate Sedation:      Not Applicable - Patient had care per Anesthesia. Recommendation:           - Clear liquid diet today. Slowly advance per                            surgical team                           - Continue present medications.                           - Return to GI clinic in 4 weeks. Stent removal in                            6 to 8 weeks if he does  well                           - Telephone GI clinic if symptomatic PRN. Monitor                            drainage to confirm leak slowly decreases with time Procedure Code(s):        --- Professional ---                           [phone], Endoscopic retrograde                            cholangiopancreatography (ERCP); with placement of                            endoscopic stent into biliary or pancreatic duct,                            including pre- and post-dilation and guide wire                            passage, when performed, including sphincterotomy,                            when performed, each stent Diagnosis Code(s):        --- Professional ---                           K83.8, Other specified diseases of biliary tract CPT copyright 2018 [employer]. All rights reserved. The codes documented in this report are preliminary and upon coder review may  be revised to meet current compliance requirements. Clarene Essex, MD 01/06/2019 9:34:29 AM This report has been  signed electronically. Number of Addenda: 0

## 2019-01-06 NOTE — Anesthesia Preprocedure Evaluation (Signed)
Anesthesia Evaluation  Patient identified by MRN, date of birth, ID bandGeneral Assessment Comment:Patient responds to commands  Reviewed: Allergy & Precautions, Patient's Chart, lab work & pertinent test results  Airway Mallampati: II  TM Distance: >3 FB Neck ROM: Full    Dental  (+) Edentulous Upper, Edentulous Lower   Pulmonary COPD, former smoker,    Pulmonary exam normal breath sounds clear to auscultation       Cardiovascular hypertension, + CAD, + Past MI and + Cardiac Stents (x 1)  Normal cardiovascular exam+ Valvular Problems/Murmurs MR  Rhythm:Regular Rate:Normal  ECG: SR, rate 78  ECHO: Mild global reduction in LV systolic function; grade 1 diastolic dysfunction; elevated LV filling pressure; mild AS and AI; mild to moderate MR; mild TR; moderately elevated pulmonary pressure.  Myocardial perfusion The left ventricular ejection fraction is mildly decreased (45-54%). Nuclear stress EF: 51%. No T wave inversion was noted during stress. There was no ST segment deviation noted during stress. This is a low risk study.  Sees cardiologist   Neuro/Psych negative neurological ROS  negative psych ROS   GI/Hepatic negative GI ROS, Neg liver ROS,   Endo/Other  negative endocrine ROS  Renal/GU Renal disease     Musculoskeletal negative musculoskeletal ROS (+)   Abdominal   Peds  Hematology  (+) anemia , Thrombocytopenia   Anesthesia Other Findings Cholelithiasis  Reproductive/Obstetrics                             Anesthesia Physical  Anesthesia Plan  ASA: III  Anesthesia Plan: General   Post-op Pain Management:    Induction: Intravenous  PONV Risk Score and Plan: 2 and Ondansetron, Treatment may vary due to age or medical condition and Midazolam  Airway Management Planned: Oral ETT  Additional Equipment:   Intra-op Plan:   Post-operative Plan: Extubation in  OR  Informed Consent: I have reviewed the patients History and Physical, chart, labs and discussed the procedure including the risks, benefits and alternatives for the proposed anesthesia with the patient or authorized representative who has indicated his/her understanding and acceptance.       Plan Discussed with: CRNA  Anesthesia Plan Comments:         Anesthesia Quick Evaluation

## 2019-01-06 NOTE — Progress Notes (Addendum)
Day of Surgery    CC: Abdominal pain, nausea and vomiting  Subjective: Patient sleeping now status post ERCP.  He has a Actuary, and has been confused and combative with the staff.  Talking with his family it sounds like he gets a little confused sometimes at home.  This is much worse than they normally see.  His wife plans to stay with him when he goes home.  She apparently still works at least part-time.  He is Geneticist, molecular and appears frail.  His JP is about half full of bilious drainage.  Objective: Vital signs in last 24 hours: Temp:  [97.7 F (36.5 C)-98.5 F (36.9 C)] 97.7 F (36.5 C) (01/15 1025) Pulse Rate:  [72-87] 86 (01/15 1025) Resp:  [13-22] 20 (01/15 1025) BP: (161-195)/(63-105) 170/105 (01/15 1025) SpO2:  [92 %-100 %] 100 % (01/15 1025) Last BM Date: 01/06/19  2573 IV 800 urine Drain 840 BM x4 Afebrile some high blood pressures. Last creatinine is 2.61 this a.m. ERCP 1/15:  ERCP with findings suggestive of residual cystic duct bile leak with subsequent placement of an internal biliary stent. Intake/Output from previous day: 01/14 0701 - 01/15 0700 In: 2723.6 [I.V.:2573.6; IV Piggyback:150] Out: 1640 [Urine:800; Drains:840] Intake/Output this shift: Total I/O In: 300 [I.V.:300] Out: -   General appearance: Sleeping, he did not wake up even when I pulled the sheets down and pulled his gown up to examine him. Resp: clear to auscultation bilaterally GI: Abdomen soft, port sites look fine.  JP drain shows bilious drainage. Medications: Tylenol 1 g every 8, Apresoline 25 mg 3 times daily, Imdur 30 mg daily he is gotten 2 doses of Ativan; 1 at midnight and 1 at 0 500 this morning.  Lopressor 25 mg p.o. twice daily he had 1 dose of Zofran at 8:30 AM.  He still on Zosyn, he got 1 dose of trazodone last evening he has not had any narcotics that I can see for at least 48 hours. Lab Results:  Recent Labs    01/05/19 0735  WBC 6.9  HGB 10.1*  HCT 31.9*  PLT 71*     BMET Recent Labs    01/05/19 0735 01/05/19 0930 01/06/19 0531  NA 145 146*  --   K 4.3 5.0 4.9  CL 117* 118*  --   CO2 17* 18*  --   GLUCOSE 123* 119*  --   BUN 73* 73*  --   CREATININE 2.63* 2.65* 2.61*  CALCIUM 8.3* 8.5*  --    PT/INR No results for input(s): LABPROT, INR in the last 72 hours.  Recent Labs  Lab 01/01/19 1150 01/02/19 1335 01/05/19 0735 01/05/19 0930 01/06/19 0531  AST 20 98* 19 18 22   ALT 9 69* 15 14 13   ALKPHOS 90 75 49 49 59  BILITOT 1.4* 0.9 0.9 0.9 1.1  PROT 7.3 6.4* 5.5* 5.7* 5.5*  ALBUMIN 4.3 3.7 2.7* 2.8* 2.5*     Lipase     Component Value Date/Time   LIPASE 76 (H) 01/05/2019 0930     Medications: . acetaminophen  1,000 mg Oral TID  . aspirin EC  81 mg Oral Daily  . brinzolamide  1 drop Left Eye Q12H  . bupivacaine liposome  20 mL Infiltration Once  . calcium-vitamin D  1 tablet Oral QODAY  . Chlorhexidine Gluconate Cloth  6 each Topical Once  . feeding supplement  237 mL Oral BID BM  . hydrALAZINE  25 mg Oral TID  . isosorbide mononitrate  30 mg Oral Daily  . latanoprost  1 drop Both Eyes QHS  . lip balm  1 application Topical BID  . metoprolol tartrate  25 mg Oral BID  . mirtazapine  7.5 mg Oral QHS  . polyethylene glycol  17 g Oral BID  . pravastatin  20 mg Oral q1800   . sodium chloride 100 mL/hr at 01/06/19 0603  . methocarbamol (ROBAXIN) IV    . ondansetron (ZOFRAN) IV    . piperacillin-tazobactam 2.25 g (01/06/19 0604)   Anti-infectives (From admission, onward)   Start     Dose/Rate Route Frequency Ordered Stop   01/02/19 1400  piperacillin-tazobactam (ZOSYN) IVPB 2.25 g  Status:  Discontinued     2.25 g 100 mL/hr over 30 Minutes Intravenous Every 8 hours 01/02/19 1223 01/02/19 1224   01/02/19 1400  piperacillin-tazobactam (ZOSYN) IVPB 2.25 g     2.25 g 100 mL/hr over 30 Minutes Intravenous Every 8 hours 01/02/19 1224 01/07/19 1359   01/02/19 0909  ceFAZolin (ANCEF) 2-4 GM/100ML-% IVPB  Status:  Discontinued     Note to Pharmacy:  Nicholas Porter   : cabinet override      01/02/19 0909 01/02/19 0916   01/02/19 0600  ceFAZolin (ANCEF) IVPB 2g/100 mL premix  Status:  Discontinued     2 g 200 mL/hr over 30 Minutes Intravenous On call to O.R. 01/01/19 1909 01/02/19 1218   01/02/19 0600  metroNIDAZOLE (FLAGYL) IVPB 500 mg     500 mg 100 mL/hr over 60 Minutes Intravenous On call to O.R. 01/01/19 1909 01/02/19 0836   01/01/19 1730  cefTRIAXone (ROCEPHIN) 2 g in sodium chloride 0.9 % 100 mL IVPB  Status:  Discontinued     2 g 200 mL/hr over 30 Minutes Intravenous  Once 01/01/19 1728 01/02/19 1218     Assessment/Plan Hypertension stage II Combined systolic and diastolic congestive heart failure CAD/PAD CKD III COPD/history of tobacco use Post op confusion  Acute gangrenous cholecystitis; Hx vagotomy antrectomy  Diagnostic laparoscopy, laparoscopic cholecystectomy, laparoscopic lysis of adhesion x 45 minutes, placement of drain, 01/02/2019, Dr. Michael Boston   -Postop bile leak; HIDA scan positive; 01/05/2019   -GI consult 01/05/2019   -ERCP 1/15; initial cholangiogram shows a cystic duct leak, biliary sphincterotomy placement of a 10 French by 7 cm plastic stent, Dr. Lizbeth Bark  FEN: IV fluids/clear liquids ID: Zosyn 1/11 >> day 5 DVT: SCDs;  restart Lovenox tomorrow Follow-up: Dr. Remo Lipps Gross/Dr. Clarene Essex  Plan: I told the family keeping the blinds open keeping daylight and during the day was a great idea.  I would let him sleep today, he had sedation for his ERCP.  Tomorrow we can work on getting him more reoriented and try to keep him awake during the day so he will sleep better at night.  I told him to expect the drain to go home with him I showed him how to empty it.  We will get OT and PT involved tomorrow and see what we need to help the family care for him at home.  Case manager consult tomorrow also.    LOS: 5 days    Nicholas Porter,Nicholas Porter 01/06/2019 [phone]  Agree with  above. ERCP with stent placement today by Dr. Watt Climes.   Alphonsa Overall, MD, [facility] Surgery Pager: [phone] Office phone:  [phone]

## 2019-01-06 NOTE — Anesthesia Procedure Notes (Signed)
Procedure Name: Intubation Date/Time: 01/06/2019 8:45 AM Performed by: Lavina Hamman, CRNA Pre-anesthesia Checklist: Patient identified, Emergency Drugs available, Suction available, Patient being monitored and Timeout performed Patient Re-evaluated:Patient Re-evaluated prior to induction Oxygen Delivery Method: Circle system utilized Preoxygenation: Pre-oxygenation with 100% oxygen Induction Type: IV induction Ventilation: Mask ventilation without difficulty Laryngoscope Size: Mac and 4 Grade View: Grade I Tube type: Oral Tube size: 7.5 mm Number of attempts: 1 Airway Equipment and Method: Stylet Placement Confirmation: ETT inserted through vocal cords under direct vision,  positive ETCO2,  CO2 detector and breath sounds checked- equal and bilateral Secured at: 22 cm Tube secured with: Tape Dental Injury: Teeth and Oropharynx as per pre-operative assessment

## 2019-01-06 NOTE — Progress Notes (Signed)
Nicholas Porter 8:35 AM  Subjective: Patient seen and examined last night and this morning case discussed with his wife at both times and the patient is familiar to me from a few outpatient visits over the summer and his hospital computer chart was reviewed and his case discussed with the other GI team's PA and he actually has no pain complaints but his altered mental status has been a problem postop  Objective: Vital signs stable afebrile no acute distress answering occasional questions last night today increased lethargy exam please see preassessment evaluation last night his abdomen was minimally sore but no guarding or rebound creatinine increased over baseline liver tests okay CBC stable platelet count a little low historically it is been low normal admission CT and ultrasound reviewed HIDA scan confirmed leak pathology reviewed  Assessment: We will medical problems including bile leak  Plan: The risks benefits methods success rate of ERCP was discussed with the patient and his wife and she has agreed to the procedure which we will proceed with anesthesia assistance today  [facility] E  Pager [phone] After 5PM or if no answer call [phone]

## 2019-01-06 NOTE — Progress Notes (Signed)
0015--Pt having severe hallucinations, grasping at the air and yelling, "call the police!!" Pt attempting to get out of bed, becoming aggressive with staff. NP on call notified. One time order for  0.5mg  IV ativan placed. Pt was able to rest peacefully after ativan given.    0515--Pt had a bowl movement and staff attempted to clean patient and change linen. Pt became agitated, began hitting and cursing at staff. NP on call notified. One time order for 1mg  IV ativan placed. Staff was able to clean up pt, Pt now resting comfortably.   Will continue to monitor.

## 2019-01-07 DIAGNOSIS — K9189 Other postprocedural complications and disorders of digestive system: Secondary | ICD-10-CM

## 2019-01-07 DIAGNOSIS — K838 Other specified diseases of biliary tract: Secondary | ICD-10-CM

## 2019-01-07 LAB — COMPREHENSIVE METABOLIC PANEL
ALT: 14 U/L (ref 0–44)
AST: 23 U/L (ref 15–41)
Albumin: 2.7 g/dL — ABNORMAL LOW (ref 3.5–5.0)
Alkaline Phosphatase: 97 U/L (ref 38–126)
Anion gap: 9 (ref 5–15)
BUN: 64 mg/dL — ABNORMAL HIGH (ref 8–23)
CALCIUM: 8.5 mg/dL — AB (ref 8.9–10.3)
CO2: 13 mmol/L — AB (ref 22–32)
Chloride: 125 mmol/L — ABNORMAL HIGH (ref 98–111)
Creatinine, Ser: 2.55 mg/dL — ABNORMAL HIGH (ref 0.61–1.24)
GFR calc Af Amer: 25 mL/min — ABNORMAL LOW (ref >60)
GFR calc non Af Amer: 22 mL/min — ABNORMAL LOW (ref >60)
Glucose, Bld: 108 mg/dL — ABNORMAL HIGH (ref 70–99)
Potassium: 5.4 mmol/L — ABNORMAL HIGH (ref 3.5–5.1)
Sodium: 147 mmol/L — ABNORMAL HIGH (ref 135–145)
Total Bilirubin: 1.2 mg/dL (ref 0.3–1.2)
Total Protein: 5.4 g/dL — ABNORMAL LOW (ref 6.5–8.1)

## 2019-01-07 LAB — CBC
HCT: 33.4 % — ABNORMAL LOW (ref 39.0–52.0)
Hemoglobin: 10.4 g/dL — ABNORMAL LOW (ref 13.0–17.0)
MCH: 32.9 pg (ref 26.0–34.0)
MCHC: 31.1 g/dL (ref 30.0–36.0)
MCV: 105.7 fL — ABNORMAL HIGH (ref 80.0–100.0)
Platelets: 109 10*3/uL — ABNORMAL LOW (ref 150–400)
RBC: 3.16 MIL/uL — ABNORMAL LOW (ref 4.22–5.81)
RDW: 14.3 % (ref 11.5–15.5)
WBC: 9.5 10*3/uL (ref 4.0–10.5)
nRBC: 0 % (ref 0.0–0.2)

## 2019-01-07 MED ORDER — LORAZEPAM 2 MG/ML IJ SOLN
1.0000 mg | Freq: Once | INTRAMUSCULAR | Status: AC
  Administered 2019-01-07: 1 mg via INTRAVENOUS
  Filled 2019-01-07: qty 1

## 2019-01-07 MED ORDER — ENSURE ENLIVE PO LIQD
237.0000 mL | Freq: Two times a day (BID) | ORAL | Status: DC
  Administered 2019-01-08 – 2019-01-09 (×2): 237 mL via ORAL

## 2019-01-07 MED ORDER — ADULT MULTIVITAMIN W/MINERALS CH
1.0000 | ORAL_TABLET | Freq: Every day | ORAL | Status: DC
  Administered 2019-01-08 – 2019-01-11 (×3): 1 via ORAL
  Filled 2019-01-07 (×3): qty 1

## 2019-01-07 MED ORDER — BOOST / RESOURCE BREEZE PO LIQD CUSTOM
1.0000 | Freq: Three times a day (TID) | ORAL | Status: DC
  Administered 2019-01-08 – 2019-01-11 (×6): 1 via ORAL

## 2019-01-07 NOTE — Progress Notes (Signed)
Nutrition Follow-up  DOCUMENTATION CODES:   Severe malnutrition in context of chronic illness, Underweight  INTERVENTION:  - Will d/c Ensure Surgery. - Will order Boost Breeze TID, each supplement provides 250 kcal and 9 grams of protein. - Will order Ensure Enlive BID for once diet advanced to at least FLD, each supplement provides 350 kcal and 20 grams of protein. - Will order daily multivitamin with minerals.  - Continue diet advancement as medically feasible. - Continue to encourage PO intakes.   NUTRITION DIAGNOSIS:   Severe Malnutrition related to chronic illness(COPD, CKD, CHF) as evidenced by severe fat depletion, severe muscle depletion, energy intake < or equal to 75% for > or equal to 1 month. -ongoing  GOAL:   Patient will meet greater than or equal to 90% of their needs -unmet  MONITOR:   PO intake, Supplement acceptance, Labs, Weight trends, I & O's  ASSESSMENT:   85 y.o. male has medical history relevant for CAD with prior angioplasty and stenting, PAD with prior left carotid endodiathermy, COPD/ patient is a reformed smoker who quit smoking about 50 years ago as well as h/o partial gastrectomy/vagatomy in distant past for ulcer.  1/11: s/p Laparoscopic cholecystectomy , LOA  No weight since admission. Diet advanced from NPO to CLD 1/15 at 12:15 PM. No intakes documented since diet advancement. Flowsheet indicates that patient is a/o to self only. Spoke with RN who reports that patient has been "zonked" most of the day after being given Ativan and that he has not consumed anything PO this shift.   Per Dr. Rodena Piety' note this afternoon: acute calculus cholecystitis complicated by active bile leak (s/p HIDA scan 1/14 and ERCP), AKI, anorexia and weight loss with consideration for GI consult for endoluminal evaluation and remeron ordered for appetite stimulation.   Medications reviewed; 1 tablet oscal-D/day, 1 packet miralax BID, 7.5 mg remeron/day. Labs reviewed;  creatinine: 2.61 mg/dl, Ca: 8.5 mg/dl, GFR: 21 ml/min. IVF; NS @ 50 ml/hr.    Diet Order:   Diet Order            Diet clear liquid Room service appropriate? Yes; Fluid consistency: Thin  Diet effective now              EDUCATION NEEDS:   Not appropriate for education at this time  Skin:  Skin Assessment: Reviewed RN Assessment  Last BM:  1/15  Height:   Ht Readings from Last 1 Encounters:  01/01/19 5\' 7"  (1.702 m)    Weight:   Wt Readings from Last 1 Encounters:  01/01/19 43.4 kg    Ideal Body Weight:  67.2 kg  BMI:  Body mass index is 14.99 kg/m.  Estimated Nutritional Needs:   Kcal:  1600-1800  Protein:  75-85g  Fluid:  1.8L/day     Jarome Matin, MS, RD, LDN, CNSC Inpatient Clinical Dietitian Pager # [phone] After hours/weekend pager # [phone]

## 2019-01-07 NOTE — Clinical Social Work Note (Signed)
Clinical Social Work Assessment  Patient Details  Name: Nicholas Porter MRN: [id number] Date of Birth: 12-11-1930  Date of referral:  01/07/19               Reason for consult:  Facility Placement, Discharge Planning                Permission sought to share information with:  Family Supports Permission granted to share information::  Yes, Verbal Permission Granted  Name::     Nicholas Porter  Agency::  snf  Relationship::  spouse  Contact Information:  [phone]  Housing/Transportation Living arrangements for the past 2 months:  Single Family Home Source of Information:  Adult Children, Spouse Patient Interpreter Needed:  None Criminal Activity/Legal Involvement Pertinent to Current Situation/Hospitalization:  No - Comment as needed Significant Relationships:  Adult Children, Spouse, Other Family Members, Friend Lives with:  Spouse Do you feel safe going back to the place where you live?  Yes Need for family participation in patient care:  Yes (Comment)  Care giving concerns:  CSW unable to assess patient as he is only orient to self. CSW did assessment with patients wife and adult daughter at bedside   Social Worker assessment / plan:  CSW spoke with family about discharge plan. Patient lives at home with spouse but spouse stated she is unable to care for patient on her own. Family agreeable with discharge plan to rehab but stated they would prefer a facility that takes [employer] since patient has [employer] as a secondary. CSW will continue to follow family  Employment status:  Retired Forensic scientist:  Medicare PT Recommendations:  [place] / Referral to community resources:  [place]  Patient/Family's Response to care:  Family supportive of patients needs  Patient/Family's Understanding of and Emotional Response to Diagnosis, Current Treatment, and Prognosis:  Family agreeable with discharge plan for patient to go to SNF for  rehab  Emotional Assessment Appearance:  Appears stated age Attitude/Demeanor/Rapport:  Unable to Assess Affect (typically observed):  Unable to Assess Orientation:  Oriented to Self Alcohol / Substance use:  Not Applicable Psych involvement (Current and /or in the community):  No (Comment)  Discharge Needs  Concerns to be addressed:  Care Coordination Readmission within the last 30 days:  No Current discharge risk:  Dependent with Mobility Barriers to Discharge:  Continued Medical Work up   [employer], LCSW 01/07/2019, 3:18 PM

## 2019-01-07 NOTE — Progress Notes (Signed)
Patient asleep for all of shift today.  Patient did moan, groan, reach out and resist during linen changes/peri care.  Unable to give patient anything by mouth due to lethargy.  Family at bedside for most of shift.

## 2019-01-07 NOTE — Care Management Important Message (Signed)
Important Message  Patient Details  Name: Nicholas Porter MRN: [id number] Date of Birth: 1930/10/28   Medicare Important Message Given:  Yes    Kerin Salen 01/07/2019, 11:33 AMImportant Message  Patient Details  Name: Nicholas Porter MRN: [id number] Date of Birth: 25-Jun-1930   Medicare Important Message Given:  Yes    Kerin Salen 01/07/2019, 11:32 AM

## 2019-01-07 NOTE — Progress Notes (Signed)
Patient is yelling at a boy "Im going to kick your ass boy" he is agitated and trying to climb out of bed. Writer notified provider. Will continue to monitor.

## 2019-01-07 NOTE — Progress Notes (Signed)
PROGRESS NOTE    Nicholas Porter  QPR:[id number] DOB: 12/15/1930 DOA: 01/01/2019 PCP: Dorothyann Peng, NP    Brief Narrative: 85 y.o.malehas medical history relevant for CAD with prior angioplasty and stenting, PAD with prior left carotid endodiathermy, COPD/patient is a reformed smoker who quit smoking about 50 years ago as well ash/o partial gastrectomy/vagatomy in distant past for ulcer. LAst EGD 2009 with mild gastritis ,CKDIII, unintentional weight loss 60 lbs last year admitted on 01/01/2019 with abdominal pain and found to have calculus cholecystitis, status post lap chole and lysis of adhesions on 01/02/2019 by Dr. Johney Maine, patient subsequently developed AKI,JP drain with high output, HIDA scan on 01/05/2019 with active bile leak to the OR on 01/06/2019   Assessment & Plan:   Principal Problem:   Delayed cystic duct stump bile leak s/p ERCP/stent 01/06/2019 Active Problems:   Essential hypertension   CAD S/P percutaneous coronary angioplasty - DES in RCA with PTCA for ISR; Moderate mLAD, 80% ostial OM2 stable   H/O Inferior STEMI (09/2013) emergent PCI with Promus DES to RCA (3.0 mm  x 28 mm - 3.2 mm)   GERD (gastroesophageal reflux disease)   Malnutrition of moderate degree   Benign prostatic hyperplasia   Acute gangrenous cholecystitis s/p lap cholecystectomy 01/02/2019   Unintentional weight loss   History of medication noncompliance   History of partial gastrectomy   Pill dysphagia   CKD (chronic kidney disease), stage III (HCC)   Protein-calorie malnutrition, severe   1)Acute calculus cholecystitis-----complicated by active bile leak,JP drain with high output, HIDA scan on 01/05/2019 with active bile leak to the OR on 01/06/2019, status post 5 days of IV antibiotics Rocephin and Flagyl.  S/P ERCP sphinctererotomy and stent in CBD.  2)HTN--stage II, stable, continue. Imdur 30 mg daily along with hydralazine 25 mg 3 times daily andmetoprolol 25 mg twice daily,may use  IV labetalol when necessary Every 4 hours for systolic blood pressure over 160 mmhg.prn hydralazine  4)HFpEF---patient with history of combined systolic and diastolic dysfunction CHF, repeat echo with preserved EF of 55 to 60% with grade 1 dCHF in the setting of known CAD and possible ischemic cardiomyopathy---be judicious with IV fluids to avoid volume overload, continue metoprolol, isosorbide/hydralazine combo, , QTC on EKG is normalized  5)H/o CAD and PAD--CAD with prior angioplasty and stenting, PAD with prior left carotid endarterecmy ,patient apparently is intolerant to Lipitor trial low-dose pravastatin, continue aspirin ----continue metoprolol, no ACS type symptoms at this time, EKG without ischemic changes, repeat echo with preserved EF of 55 to 60% with grade 1 dCHF  6)AKI----acute kidney injury on CKD stage - III due to dehydration in the setting of persistent nausea and abdominal pain with poor oral intake poor oral intake preop and postopand now compounded by very high output post cholecystectomy bile leakcreatinine on admission= 1.75, , baseline creatinine = 1.9 (04/2018) , creatinine is now= 2.61, renally adjust medications, avoid nephrotoxic agents/dehydration/hypotension , continue to hydrate  7)Anorexia and Weight Loss----consider GI consult for endoluminal evaluation (last colonoscopy apparently about 7 to 10 years ago), continue Remeron for appetite stimulation and sleep,nutritional consult appreciated, TSH was 2.4 in April 2019  8)COPD/emphysema----quit smoking about 50 years ago, may use PRN albuterol, no acute flareup of COPD at this time  9)Generalized weakness/debility--- PT eval appreciated, recommend SNF rehab    Nutrition Problem: Severe Malnutrition Etiology: chronic illness(COPD, CKD, CHF)     Signs/Symptoms: severe fat depletion, severe muscle depletion, energy intake < or equal to 75% for > or  equal to 1 month    Interventions:  Ensure Surgery, Magic cup  Estimated body mass index is 14.99 kg/m as calculated from the following:   Height as of this encounter: 5\' 7"  (1.702 m).   Weight as of this encounter: 43.4 kg.  DVT prophylaxis: scd Code Status:dnr Family Communication:dw family Disposition Plan:  To snf Consultants: gi surgery  Procedures: Laparoscopic cholecystectomy with lysis of adhesions 01/02/2019 ERCP 01/06/2019 biliary sphincterotomy 01/06/2019. Antimicrobials:none  Subjective: Sleeping confused received Ativan one time overnight  Objective: Vitals:   01/06/19 1700 01/06/19 2006 01/07/19 0100 01/07/19 0400  BP: (!) 160/82 (!) 156/76 (!) 162/91 (!) 161/67  Pulse: 87 87 82 71  Resp: 18 18 18    Temp: 98.3 F (36.8 C) 99 F (37.2 C) 98.4 F (36.9 C) 98.4 F (36.9 C)  TempSrc: Oral Oral Oral Oral  SpO2: 100% 99% 98% 100%  Weight:      Height:        Intake/Output Summary (Last 24 hours) at 01/07/2019 1203 Last data filed at 01/07/2019 1127 Gross per 24 hour  Intake 853.71 ml  Output 620 ml  Net 233.71 ml   Filed Weights   01/01/19 1132 01/01/19 2020  Weight: 45.4 kg 43.4 kg    Examination:  General exam: Appears calm and comfortable  Respiratory system: Clear to auscultation. Respiratory effort normal. Cardiovascular system: S1 & S2 heard, RRR. No JVD, murmurs, rubs, gallops or clicks. No pedal edema. Gastrointestinal system: Abdomen is nondistended, soft and nontender. No organomegaly or masses felt. Normal bowel sounds heard. Central nervous system: Confused Extremities: Symmetric 5 x 5 power. Skin: No rashes, lesions or ulcers     Data Reviewed: I have personally reviewed following labs and imaging studies  CBC: Recent Labs  Lab 01/01/19 1150 01/02/19 0458 01/03/19 1012 01/05/19 0735  WBC 11.2* 19.8* 8.5 6.9  NEUTROABS 9.7*  --  7.5  --   HGB 13.0 12.9* 11.3* 10.1*  HCT 38.5* 38.7* 35.0* 31.9*  MCV 97.5 100.5* 104.8* 106.0*  PLT 92* 89* 60* 71*   Basic  Metabolic Panel: Recent Labs  Lab 01/02/19 1335 01/03/19 1012 01/04/19 0539 01/05/19 0735 01/05/19 0930 01/06/19 0531  NA 141 138 141 145 146*  --   K 4.9 4.5 4.6 4.3 5.0 4.9  CL 107 105 112* 117* 118*  --   CO2 21* 22 19* 17* 18*  --   GLUCOSE 148* 115* 96 123* 119*  --   BUN 59* 67* 73* 73* 73*  --   CREATININE 2.33* 2.70* 2.79* 2.63* 2.65* 2.61*  CALCIUM 8.6* 8.3* 8.3* 8.3* 8.5*  --   MG 1.6* 1.8  --   --   --   --    GFR: Estimated Creatinine Clearance: 12.5 mL/min (A) (by C-G formula based on SCr of 2.61 mg/dL (H)). Liver Function Tests: Recent Labs  Lab 01/01/19 1150 01/02/19 1335 01/05/19 0735 01/05/19 0930 01/06/19 0531  AST 20 98* 19 18 22   ALT 9 69* 15 14 13   ALKPHOS 90 75 49 49 59  BILITOT 1.4* 0.9 0.9 0.9 1.1  PROT 7.3 6.4* 5.5* 5.7* 5.5*  ALBUMIN 4.3 3.7 2.7* 2.8* 2.5*   Recent Labs  Lab 01/01/19 1150 01/05/19 0930  LIPASE 41 76*   No results for input(s): AMMONIA in the last 168 hours. Coagulation Profile: No results for input(s): INR, PROTIME in the last 168 hours. Cardiac Enzymes: Recent Labs  Lab 01/02/19 0514 01/02/19 1335 01/02/19 2000  TROPONINI 0.05* 0.06* 0.05*  BNP (last 3 results) No results for input(s): PROBNP in the last 8760 hours. HbA1C: No results for input(s): HGBA1C in the last 72 hours. CBG: Recent Labs  Lab 01/02/19 0503 01/02/19 1131  GLUCAP 142* 148*   Lipid Profile: No results for input(s): CHOL, HDL, LDLCALC, TRIG, CHOLHDL, LDLDIRECT in the last 72 hours. Thyroid Function Tests: No results for input(s): TSH, T4TOTAL, FREET4, T3FREE, THYROIDAB in the last 72 hours. Anemia Panel: No results for input(s): VITAMINB12, FOLATE, FERRITIN, TIBC, IRON, RETICCTPCT in the last 72 hours. Sepsis Labs: No results for input(s): PROCALCITON, LATICACIDVEN in the last 168 hours.  Recent Results (from the past 240 hour(s))  Blood culture (routine x 2)     Status: None   Collection Time: 01/01/19  7:58 PM  Result Value Ref  Range Status   Specimen Description   Final    BLOOD LEFT ANTECUBITAL Performed at [facility] [address] [place], [place] [address]    Special Requests   Final    BOTTLES DRAWN AEROBIC AND ANAEROBIC Blood Culture adequate volume Performed at [facility] [address], [place], [place] [address]    Culture   Final    NO GROWTH 5 DAYS Performed at [facility], [place] [address], [place], [place] [address]    Report Status 01/06/2019 FINAL  Final  Blood culture (routine x 2)     Status: None   Collection Time: 01/01/19  7:58 PM  Result Value Ref Range Status   Specimen Description BLOOD LEFT FOREARM  Final   Special Requests   Final    BOTTLES DRAWN AEROBIC AND ANAEROBIC Blood Culture adequate volume Performed at [facility] [address], [name], [place] [address]    Culture   Final    NO GROWTH 5 DAYS Performed at [facility], [place] [address], [place], [place] [address]    Report Status 01/06/2019 FINAL  Final  Surgical pcr screen     Status: None   Collection Time: 01/02/19  5:57 AM  Result Value Ref Range Status   MRSA, PCR NEGATIVE NEGATIVE Final   Staphylococcus aureus NEGATIVE NEGATIVE Final    Comment: (NOTE) The Xpert SA Assay (FDA approved for NASAL specimens in patients 7 years of age and older), is one component of a comprehensive surveillance program. It is not intended to diagnose infection nor to guide or monitor treatment. Performed at [facility], [place] [address], [place], [place] [address]          Radiology Studies: Dg Ercp  Result Date: 01/06/2019 CLINICAL DATA:  Bile duct leak. EXAM: ERCP TECHNIQUE: Multiple spot images obtained with the fluoroscopic device and submitted for interpretation post-procedure. COMPARISON:  right upper quadrant abdominal ultrasound - 01/01/2019; CT abdomen and pelvis - 01/01/2019; nuclear medicine HIDA scan - 01/05/2019  FINDINGS: Four spot intraoperative fluoroscopic images of the right upper abdominal quadrant during ERCP are provided for review Initial image demonstrates an ERCP probe overlying the right upper abdominal quadrant. There is selective cannulation and opacification of the common bile duct. Multiple surgical clips overlie the expected location of the gallbladder fossa. There is a large bore surgical drainage catheter which also overlies expected location of the gallbladder fossa and caudal aspect of the right lobe of the liver. Subsequent images demonstrate further opacification of the common bile duct which appears moderately dilated. Ultimately there is opacification of the cystic duct with extravasation of contrast adjacent to the  cholecystectomy clips and surgical drainage catheter. Completion image demonstrates placement of a internal biliary stent traversing the confluence of the cystic duct. IMPRESSION: ERCP with findings suggestive of residual cystic duct bile leak with subsequent placement of an internal biliary stent. These images were submitted for radiologic interpretation only. Please see the procedural report for the amount of contrast and the fluoroscopy time utilized. Electronically Signed   By: Sandi Mariscal M.D.   On: 01/06/2019 10:02        Scheduled Meds: . aspirin EC  81 mg Oral Daily  . brinzolamide  1 drop Left Eye Q12H  . bupivacaine liposome  20 mL Infiltration Once  . calcium-vitamin D  1 tablet Oral QODAY  . Chlorhexidine Gluconate Cloth  6 each Topical Once  . feeding supplement  237 mL Oral BID BM  . hydrALAZINE  25 mg Oral TID  . isosorbide mononitrate  30 mg Oral Daily  . latanoprost  1 drop Both Eyes QHS  . lip balm  1 application Topical BID  . metoprolol tartrate  25 mg Oral BID  . mirtazapine  7.5 mg Oral QHS  . polyethylene glycol  17 g Oral BID  . pravastatin  20 mg Oral q1800   Continuous Infusions: . sodium chloride 100 mL/hr at 01/07/19 1045  .  methocarbamol (ROBAXIN) IV    . ondansetron (ZOFRAN) IV       LOS: 6 days     Georgette Shell, MD [facility]  If 7PM-7AM, please contact night-coverage [email or web address] Password [facility] 01/07/2019, 12:03 PM

## 2019-01-07 NOTE — Progress Notes (Signed)
Nicholas Porter:  [phone] General Surgery Progress Note   LOS: 6 days  POD -  1 Day Post-Op  Chief Complaint: Abdominal pain  Assessment and Plan: 1.    Laparoscopic cholecystectomy, laparoscopic lysis of adhesion x 45 minutes, placement of drain, 01/02/2019, Dr. Michael Boston             -Postop bile leak; HIDA scan positive; 01/05/2019             -ERCP 1/15; initial cholangiogram shows a cystic duct leak, biliary sphincterotomy placement of a 10 [place] by 7 cm plastic stent, Dr. Lizbeth Bark  JP drainage still a fair amount  Sedated on rounds today  2.  Hypertension stage II 3.  Combined systolic and diastolic congestive heart failure 4.  CAD/PAD 5.  CKD III  Creatinine - 2.61 - 01/06/2019 6.  COPD/history of tobacco use 7.  Post op confusion 8.  DVT prophylaxis - on hold 9.  Thrombocytopenia  Plts - 71,000 - 01/05/2018   Principal Problem:   Delayed cystic duct stump bile leak s/p ERCP/stent 01/06/2019 Active Problems:   Essential hypertension   CAD S/P percutaneous coronary angioplasty - DES in RCA with PTCA for ISR; Moderate mLAD, 80% ostial OM2 stable   H/O Inferior STEMI (09/2013) emergent PCI with Promus DES to RCA (3.0 mm  x 28 mm - 3.2 mm)   GERD (gastroesophageal reflux disease)   Malnutrition of moderate degree   Benign prostatic hyperplasia   Acute gangrenous cholecystitis s/p lap cholecystectomy 01/02/2019   Unintentional weight loss   History of medication noncompliance   History of partial gastrectomy   Pill dysphagia   CKD (chronic kidney disease), stage III (HCC)   Protein-calorie malnutrition, severe  Subjective:  Sedated.    Objective:   Vitals:   01/07/19 0100 01/07/19 0400  BP: (!) 162/91 (!) 161/67  Pulse: 82 71  Resp: 18   Temp: 98.4 F (36.9 C) 98.4 F (36.9 C)  SpO2: 98% 100%     Intake/Output from previous day:  01/15 0701 - 01/16 0700 In: 1174.4 [I.V.:1069.3; IV Piggyback:105.2] Out: 645 [Urine:300;  Drains:345]  Intake/Output this shift:  No intake/output data recorded.   Physical Exam:   General: Thin older WM who has been confused.  He was sedated when I made rounds.   HEENT: Normal. Pupils equal. .   Lungs: Clear.   Abdomen: Soft.  Has a few BS.   Wound: Clean Drain in mid abdomen - 345 cc recorded last 24 hours   Lab Results:    Recent Labs    01/05/19 0735  WBC 6.9  HGB 10.1*  HCT 31.9*  PLT 71*    BMET   Recent Labs    01/05/19 0735 01/05/19 0930 01/06/19 0531  NA 145 146*  --   K 4.3 5.0 4.9  CL 117* 118*  --   CO2 17* 18*  --   GLUCOSE 123* 119*  --   BUN 73* 73*  --   CREATININE 2.63* 2.65* 2.61*  CALCIUM 8.3* 8.5*  --     PT/INR  No results for input(s): LABPROT, INR in the last 72 hours.  ABG  No results for input(s): PHART, HCO3 in the last 72 hours.  Invalid input(s): PCO2, PO2   Studies/Results:  Dg Ercp  Result Date: 01/06/2019 CLINICAL DATA:  Bile duct leak. EXAM: ERCP TECHNIQUE: Multiple spot images obtained with the fluoroscopic device and submitted for interpretation post-procedure. COMPARISON:  right upper quadrant  abdominal ultrasound - 01/01/2019; CT abdomen and pelvis - 01/01/2019; nuclear medicine HIDA scan - 01/05/2019 FINDINGS: Four spot intraoperative fluoroscopic images of the right upper abdominal quadrant during ERCP are provided for review Initial image demonstrates an ERCP probe overlying the right upper abdominal quadrant. There is selective cannulation and opacification of the common bile duct. Multiple surgical clips overlie the expected location of the gallbladder fossa. There is a large bore surgical drainage catheter which also overlies expected location of the gallbladder fossa and caudal aspect of the right lobe of the liver. Subsequent images demonstrate further opacification of the common bile duct which appears moderately dilated. Ultimately there is opacification of the cystic duct with extravasation of contrast adjacent  to the cholecystectomy clips and surgical drainage catheter. Completion image demonstrates placement of a internal biliary stent traversing the confluence of the cystic duct. IMPRESSION: ERCP with findings suggestive of residual cystic duct bile leak with subsequent placement of an internal biliary stent. These images were submitted for radiologic interpretation only. Please see the procedural report for the amount of contrast and the fluoroscopy time utilized. Electronically Signed   By: Sandi Mariscal M.D.   On: 01/06/2019 10:02   Nm Hepatobiliary Including Gb  Result Date: 01/05/2019 CLINICAL DATA:  Laparoscopic cholecystectomy. Cholecystectomy 01/02/2019. Bilious drain output. EXAM: NUCLEAR MEDICINE HEPATOBILIARY IMAGING TECHNIQUE: Sequential images of the abdomen were obtained out to 60 minutes following intravenous administration of radiopharmaceutical. RADIOPHARMACEUTICALS:  5.0 mCi Tc-57m  Choletec IV COMPARISON:  CT and ultrasound 01/01/2019 FINDINGS: Prompt clearance of radiotracer from the blood pool and homogeneous uptake in the liver. Excreted radiotracer quickly fills the common hepatic duct and extends into the gallbladder fossa. Radiotracer extends along the RIGHT hepatic margin and into RIGHT lower quadrant peritoneal space. Findings consistent with active bile leak. No counts evident within the bowel. IMPRESSION: 1. Active bile leak with excreted radiotracer filling the gallbladder fossa and extending into the RIGHT lower quadrant peritoneal space. 2. No significant counts transit to the small bowel. These results will be called to the ordering clinician or representative by the Radiologist Assistant, and communication documented in the PACS or zVision Dashboard. Electronically Signed   By: Suzy Bouchard M.D.   On: 01/05/2019 12:13     Anti-infectives:   Anti-infectives (From admission, onward)   Start     Dose/Rate Route Frequency Ordered Stop   01/02/19 1400  piperacillin-tazobactam  (ZOSYN) IVPB 2.25 g  Status:  Discontinued     2.25 g 100 mL/hr over 30 Minutes Intravenous Every 8 hours 01/02/19 1223 01/02/19 1224   01/02/19 1400  piperacillin-tazobactam (ZOSYN) IVPB 2.25 g     2.25 g 100 mL/hr over 30 Minutes Intravenous Every 8 hours 01/02/19 1224 01/07/19 0650   01/02/19 0909  ceFAZolin (ANCEF) 2-4 GM/100ML-% IVPB  Status:  Discontinued    Note to Pharmacy:  Guerry Bruin   : cabinet override      01/02/19 0909 01/02/19 0916   01/02/19 0600  ceFAZolin (ANCEF) IVPB 2g/100 mL premix  Status:  Discontinued     2 g 200 mL/hr over 30 Minutes Intravenous On call to O.R. 01/01/19 1909 01/02/19 1218   01/02/19 0600  metroNIDAZOLE (FLAGYL) IVPB 500 mg     500 mg 100 mL/hr over 60 Minutes Intravenous On call to O.R. 01/01/19 1909 01/02/19 0836   01/01/19 1730  cefTRIAXone (ROCEPHIN) 2 g in sodium chloride 0.9 % 100 mL IVPB  Status:  Discontinued     2 g 200 mL/hr over  30 Minutes Intravenous  Once 01/01/19 1728 01/02/19 1218      Alphonsa Overall, MD, FACS Pager: [phone] Surgery Porter: [phone] 01/07/2019

## 2019-01-07 NOTE — Progress Notes (Signed)
[facility] Gastroenterology Progress Note  Nicholas Porter 85 y.o. October 31, 1930  CC: Bile leak   Subjective: Patient with intermittent confusion and hallucination according to nursing staff as well as patient's daughter.  Currently resting in the bed comfortably after receiving Ativan.     Objective: Vital signs in last 24 hours: Vitals:   01/07/19 0100 01/07/19 0400  BP: (!) 162/91 (!) 161/67  Pulse: 82 71  Resp: 18   Temp: 98.4 F (36.9 C) 98.4 F (36.9 C)  SpO2: 98% 100%    Physical Exam:  General.  Resting comfortably in the bed.  Not in acute distress Abd :.  Soft, nontender, nondistended, bowel sounds present.  No peritoneal signs  Lab Results: Recent Labs    01/05/19 0735 01/05/19 0930 01/06/19 0531  NA 145 146*  --   K 4.3 5.0 4.9  CL 117* 118*  --   CO2 17* 18*  --   GLUCOSE 123* 119*  --   BUN 73* 73*  --   CREATININE 2.63* 2.65* 2.61*  CALCIUM 8.3* 8.5*  --    Recent Labs    01/05/19 0930 01/06/19 0531  AST 18 22  ALT 14 13  ALKPHOS 49 59  BILITOT 0.9 1.1  PROT 5.7* 5.5*  ALBUMIN 2.8* 2.5*   Recent Labs    01/05/19 0735  WBC 6.9  HGB 10.1*  HCT 31.9*  MCV 106.0*  PLT 71*   No results for input(s): LABPROT, INR in the last 72 hours.    Assessment/Plan: -Bile leak status post laparoscopic cholecystectomy.  ERCP yesterday with sphincterotomy and plastic stent placement.(10 [place] by 7 cm) -Confusion Multiple medical issues.  Recommendations ------------------------ -Continue current management.  LFTs remains normal.  No leukocytosis. -GI will follow tomorrow.  Otis Brace MD, [place] 01/07/2019, 1:03 PM  Contact #  [phone]

## 2019-01-07 NOTE — NC FL2 (Signed)
[place] LEVEL OF CARE SCREENING TOOL     IDENTIFICATION  Patient Name: Nicholas Porter Birthdate: Jun 12, 1930 Sex: male Admission Date (Current Location): 01/01/2019  [facility] and [place] Number:  Herbalist and Address:  [facility],  [place], [place]      Provider Number: [id number]  Attending Physician Name and Address:  Georgette Shell, MD  Relative Name and Phone Number:  Byard Carranza, [phone]    Current Level of Care: Hospital Recommended Level of Care: [place] Prior Approval Number:    Date Approved/Denied:   PASRR Number: [id number]  Discharge Plan: SNF    Current Diagnoses: Patient Active Problem List   Diagnosis Date Noted  . Delayed cystic duct stump bile leak s/p ERCP/stent 01/06/2019 01/07/2019  . Protein-calorie malnutrition, severe 01/04/2019  . CKD (chronic kidney disease), stage III ([place]) 01/03/2019  . Pill dysphagia 01/02/2019  . Acute gangrenous cholecystitis s/p lap cholecystectomy 01/02/2019 01/01/2019  . CKD (chronic kidney disease) stage 4, GFR 15-29 ml/min (HCC) 01/01/2019  . Unintentional weight loss 01/01/2019  . History of medication noncompliance 01/01/2019  . History of partial gastrectomy 01/01/2019  . Hyperlipidemia with target LDL less than 70 10/23/2018  . Psychiatric disturbance 04/15/2017  . Orthostatic hypotension 04/08/2017  . Benign prostatic hyperplasia 03/11/2017  . Weakness   . Mitral regurgitation - on exam 09/03/2016  . Malnutrition of moderate degree 11/29/2015  . Anemia of chronic renal failure, stage 4 (severe) ([place]) 11/27/2015  . GERD (gastroesophageal reflux disease) 01/24/2015  . Thrombocytopenia ([place]) 08/09/2014  . H/O Inferior STEMI (09/2013) emergent PCI with Promus DES to RCA (3.0 mm  x 28 mm - 3.2 mm) 10/05/2013  . 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 CAD S/P percutaneous coronary angioplasty - DES in RCA with PTCA for ISR; Moderate  mLAD, 80% ostial OM2 stable 05/22/2008  . Essential hypertension 06/30/2007  . Left-sided carotid artery disease -- s/p CEA 04/06/2006    Class: History of    Orientation RESPIRATION BLADDER Height & Weight     Self  Normal Incontinent Weight: 95 lb 10.9 oz (43.4 kg) Height:  5\' 7"  (170.2 cm)  BEHAVIORAL SYMPTOMS/MOOD NEUROLOGICAL BOWEL NUTRITION STATUS      Continent, Incontinent Diet(clear liquids)  AMBULATORY STATUS COMMUNICATION OF NEEDS Skin   Limited Assist Verbally Other (Comment)(jp drain on belly)                       Personal Care Assistance Level of Assistance  Bathing, Feeding, Dressing Bathing Assistance: Limited assistance Feeding assistance: Limited assistance Dressing Assistance: Limited assistance     Functional Limitations Info  Sight, Hearing, Speech Sight Info: Adequate Hearing Info: Adequate Speech Info: Adequate    SPECIAL CARE FACTORS FREQUENCY  PT (By licensed PT), OT (By licensed OT), Speech therapy     PT Frequency: 5x wk OT Frequency: 5x wk     Speech Therapy Frequency: 2x wk      Contractures Contractures Info: Not present    Additional Factors Info  Code Status Code Status Info: DNR             Current Medications (01/07/2019):  This is the current hospital active medication list Current Facility-Administered Medications  Medication Dose Route Frequency Provider Last Rate Last Dose  . 0.9 %  sodium chloride infusion   Intravenous Continuous Georgette Shell, MD 100 mL/hr at 01/07/19 1045    . acetaminophen (TYLENOL) suppository 650 mg  650 mg Rectal Q4H PRN Georgette Shell, MD      . albuterol (PROVENTIL) (2.5 MG/3ML) 0.083% nebulizer solution 2.5 mg  2.5 mg Nebulization Q2H PRN Clarene Essex, MD      . alum & mag hydroxide-simeth (MAALOX/MYLANTA) 200-200-20 MG/5ML suspension 30 mL  30 mL Oral Q6H PRN Clarene Essex, MD      . aspirin EC tablet 81 mg  81 mg Oral Daily Clarene Essex, MD   81 mg at 01/05/19 1157  .  bisacodyl (DULCOLAX) suppository 10 mg  10 mg Rectal Q12H PRN Clarene Essex, MD      . brinzolamide (AZOPT) 1 % ophthalmic suspension 1 drop  1 drop Left Eye Q12H Clarene Essex, MD   1 drop at 01/06/19 2122  . bupivacaine liposome (EXPAREL) 1.3 % injection 266 mg  20 mL Infiltration Once Clarene Essex, MD      . calcium-vitamin D (OSCAL WITH D) 500-200 MG-UNIT per tablet 1 tablet  1 tablet Oral Hoy Morn, MD   1 tablet at 01/05/19 1156  . Chlorhexidine Gluconate Cloth 2 % PADS 6 each  6 each Topical Once Clarene Essex, MD      . feeding supplement (ENSURE SURGERY) liquid 237 mL  237 mL Oral BID BM Clarene Essex, MD   237 mL at 01/05/19 1156  . fentaNYL (SUBLIMAZE) injection 25-50 mcg  25-50 mcg Intravenous Q1H PRN Clarene Essex, MD   25 mcg at 01/06/19 2323  . guaiFENesin-dextromethorphan (ROBITUSSIN DM) 100-10 MG/5ML syrup 10 mL  10 mL Oral Q4H PRN Clarene Essex, MD      . hydrALAZINE (APRESOLINE) injection 5 mg  5 mg Intravenous Q4H PRN Georgette Shell, MD      . hydrALAZINE (APRESOLINE) tablet 25 mg  25 mg Oral TID Clarene Essex, MD   25 mg at 01/06/19 2121  . hydrocortisone (ANUSOL-HC) 2.5 % rectal cream 1 application  1 application Topical QID PRN Clarene Essex, MD      . hydrocortisone cream 1 % 1 application  1 application Topical TID PRN Clarene Essex, MD      . isosorbide mononitrate (IMDUR) 24 hr tablet 30 mg  30 mg Oral Daily Clarene Essex, MD   30 mg at 01/05/19 1157  . labetalol (NORMODYNE,TRANDATE) injection 10 mg  10 mg Intravenous Q4H PRN Clarene Essex, MD      . latanoprost (XALATAN) 0.005 % ophthalmic solution 1 drop  1 drop Both Eyes QHS Clarene Essex, MD   1 drop at 01/06/19 2123  . lip balm (CARMEX) ointment 1 application  1 application Topical BID Clarene Essex, MD   1 application at 94/85/46 2245  . magic mouthwash  15 mL Oral QID PRN Clarene Essex, MD      . magnesium hydroxide (MILK OF MAGNESIA) suspension 30 mL  30 mL Oral Q12H PRN Clarene Essex, MD      . menthol-cetylpyridinium (CEPACOL)  lozenge 3 mg  1 lozenge Oral PRN Clarene Essex, MD      . methocarbamol (ROBAXIN) 1,000 mg in dextrose 5 % 50 mL IVPB  1,000 mg Intravenous Q6H PRN Clarene Essex, MD      . metoprolol tartrate (LOPRESSOR) tablet 25 mg  25 mg Oral BID Clarene Essex, MD   25 mg at 01/06/19 2122  . mirtazapine (REMERON) tablet 7.5 mg  7.5 mg Oral QHS Clarene Essex, MD   7.5 mg at 01/06/19 2122  . ondansetron (ZOFRAN) injection 4 mg  4 mg Intravenous Q6H PRN Magod,  Altamese Dilling, MD   4 mg at 01/06/19 7711   Or  . ondansetron (ZOFRAN) 8 mg in sodium chloride 0.9 % 50 mL IVPB  8 mg Intravenous Q6H PRN Clarene Essex, MD      . phenol (CHLORASEPTIC) mouth spray 1-2 spray  1-2 spray Mouth/Throat PRN Clarene Essex, MD      . polyethylene glycol (MIRALAX / GLYCOLAX) packet 17 g  17 g Oral Daily PRN Clarene Essex, MD      . polyethylene glycol (MIRALAX / GLYCOLAX) packet 17 g  17 g Oral BID Clarene Essex, MD   17 g at 01/06/19 2246  . pravastatin (PRAVACHOL) tablet 20 mg  20 mg Oral q1800 Clarene Essex, MD   20 mg at 01/06/19 1932  . prochlorperazine (COMPAZINE) injection 5-10 mg  5-10 mg Intravenous Q4H PRN Clarene Essex, MD      . traMADol Veatrice Bourbon) tablet 50-100 mg  50-100 mg Oral Q6H PRN Clarene Essex, MD   100 mg at 01/06/19 2122  . traZODone (DESYREL) tablet 50 mg  50 mg Oral QHS PRN Clarene Essex, MD   50 mg at 01/06/19 2121     Discharge Medications: Please see discharge summary for a list of discharge medications.  Relevant Imaging Results:  Relevant Lab Results:   Additional Information SS# [id number]  Wende Neighbors, LCSW

## 2019-01-07 NOTE — Progress Notes (Signed)
Physical Therapy Treatment Patient Details Name: Nicholas Porter MRN: [id number] DOB: 06/14/1930 Today's Date: 01/07/2019    History of Present Illness Pt is an 85 year old male s/p Acute gangrenous cholecystitis s/p lap cholecystectomy 01/02/2019 with PMHx significant for CAD with prior angioplasty and stenting, PAD with prior left carotid endodiathermy, inferior STEMI, COPD    PT Comments    Pt sleeping and lethargic today.  Family reports pt had Ativan earlier and has had decreased cognition today.  Pt did not awaken to touch and verbal cues, even from spouse.  Pt did not awaken with LE movement (perform 2 heel slides bil LEs).  Spouse reports pt was indeed very independent and loves yard work prior to admission. Spouse agreeable to SNF at this time due to current need however would like pt home as soon as safely possible.  She agrees she cannot care for him in current condition.   Follow Up Recommendations  SNF     Equipment Recommendations  Rolling walker with 5" wheels    Recommendations for Other Services       Precautions / Restrictions Precautions Precautions: Fall Precaution Comments: currently on oxygen (does not wear at baseline), JP drain    Mobility  Bed Mobility                  Transfers                    Ambulation/Gait                 Stairs             Wheelchair Mobility    Modified Rankin (Stroke Patients Only)       Balance                                            Cognition Arousal/Alertness: Lethargic;Suspect due to medications                                            Exercises      General Comments        Pertinent Vitals/Pain Pain Assessment: Faces Faces Pain Scale: No hurt    Home Living                      Prior Function            PT Goals (current goals can now be found in the care plan section) Progress towards PT goals: Not progressing  toward goals - comment(lethargic and sleeping today)    Frequency    Min 2X/week      PT Plan Current plan remains appropriate    Co-evaluation              AM-PAC PT "6 Clicks" Mobility   Outcome Measure  Help needed turning from your back to your side while in a flat bed without using bedrails?: A Lot Help needed moving from lying on your back to sitting on the side of a flat bed without using bedrails?: Total Help needed moving to and from a bed to a chair (including a wheelchair)?: Total Help needed standing up from a chair using your arms (e.g., wheelchair or bedside chair)?: Total Help needed  to walk in hospital room?: Total Help needed climbing 3-5 steps with a railing? : Total 6 Click Score: 7    End of Session Equipment Utilized During Treatment: Oxygen Activity Tolerance: Patient limited by lethargy Patient left: in bed;with family/visitor present Nurse Communication: Mobility status PT Visit Diagnosis: Other abnormalities of gait and mobility (R26.89);Muscle weakness (generalized) (M62.81)     Time: 5844-1712 PT Time Calculation (min) (ACUTE ONLY): 24 min  Charges:  $Self Care/Home Management: [place], PT, DPT Acute Rehabilitation Services Office: [phone] Pager: [phone]   Trena Platt 01/07/2019, 4:33 PM

## 2019-01-08 ENCOUNTER — Inpatient Hospital Stay (HOSPITAL_COMMUNITY): Payer: Medicare Other

## 2019-01-08 LAB — BASIC METABOLIC PANEL
Anion gap: 10 (ref 5–15)
BUN: 58 mg/dL — ABNORMAL HIGH (ref 8–23)
CO2: 15 mmol/L — ABNORMAL LOW (ref 22–32)
Calcium: 8.7 mg/dL — ABNORMAL LOW (ref 8.9–10.3)
Chloride: 126 mmol/L — ABNORMAL HIGH (ref 98–111)
Creatinine, Ser: 2.39 mg/dL — ABNORMAL HIGH (ref 0.61–1.24)
GFR calc Af Amer: 27 mL/min — ABNORMAL LOW (ref >60)
GFR calc non Af Amer: 24 mL/min — ABNORMAL LOW (ref >60)
Glucose, Bld: 103 mg/dL — ABNORMAL HIGH (ref 70–99)
Potassium: 5.2 mmol/L — ABNORMAL HIGH (ref 3.5–5.1)
Sodium: 151 mmol/L — ABNORMAL HIGH (ref 135–145)

## 2019-01-08 LAB — CBC
HCT: 34.4 % — ABNORMAL LOW (ref 39.0–52.0)
Hemoglobin: 11.2 g/dL — ABNORMAL LOW (ref 13.0–17.0)
MCH: 32.9 pg (ref 26.0–34.0)
MCHC: 32.6 g/dL (ref 30.0–36.0)
MCV: 101.2 fL — AB (ref 80.0–100.0)
Platelets: 130 10*3/uL — ABNORMAL LOW (ref 150–400)
RBC: 3.4 MIL/uL — ABNORMAL LOW (ref 4.22–5.81)
RDW: 14.1 % (ref 11.5–15.5)
WBC: 10 10*3/uL (ref 4.0–10.5)
nRBC: 0 % (ref 0.0–0.2)

## 2019-01-08 MED ORDER — LORAZEPAM 2 MG/ML IJ SOLN
0.5000 mg | Freq: Once | INTRAMUSCULAR | Status: AC
  Administered 2019-01-08: 0.5 mg via INTRAVENOUS
  Filled 2019-01-08: qty 1

## 2019-01-08 MED ORDER — SODIUM CHLORIDE 0.45 % IV SOLN
INTRAVENOUS | Status: DC
  Administered 2019-01-08 – 2019-01-10 (×3): via INTRAVENOUS

## 2019-01-08 NOTE — Progress Notes (Signed)
Occupational Therapy Treatment Patient Details Name: Nicholas Porter MRN: [id number] DOB: July 23, 1930 Today's Date: 01/08/2019    History of present illness Pt is an 85 year old male s/p Acute gangrenous cholecystitis s/p lap cholecystectomy 01/02/2019 with PMHx significant for CAD with prior angioplasty and stenting, PAD with prior left carotid endodiathermy, inferior STEMI, COPD   OT comments  Pt needed increased A this day compared to last OT session.  RN aware.    Follow Up Recommendations  SNF    Equipment Recommendations  None recommended by OT    Recommendations for Other Services      Precautions / Restrictions Precautions Precautions: Fall Precaution Comments: currently on oxygen (does not wear at baseline), JP drain       Mobility Bed Mobility Overal bed mobility: Needs Assistance             General bed mobility comments: total A to reposition in bed with RN using bed pad  Transfers                      Balance Overall balance assessment: Needs assistance         Standing balance support: Bilateral upper extremity supported Standing balance-Leahy Scale: Poor Standing balance comment: requiring UE support at this time                           ADL either performed or assessed with clinical judgement   ADL Overall ADL's : Needs assistance/impaired     Grooming: Bed level;Minimal assistance;Moderate assistance Grooming Details (indicate cue type and reason): Initially hand over hand A- then pt began to wash face and followed 1 step instructions.                                 General ADL Comments: Per RN pt had been VERY sleepy this day. Pt did wake and perform grooming activity with OT. Pt fatigued quickly with OT.   Did not get pt OOB.       Vision Patient Visual Report: No change from baseline            Cognition Arousal/Alertness: Lethargic Behavior During Therapy: WFL for tasks  assessed/performed Overall Cognitive Status: No family/caregiver present to determine baseline cognitive functioning                                 General Comments: After waking pt and providing stimulation pt did follow 1 step commands with increased time and encouragement                   Pertinent Vitals/ Pain       Pain Score: 0-No pain         Frequency  Min 2X/week        Progress Toward Goals  OT Goals(current goals can now be found in the care plan section)  Progress towards OT goals: OT to reassess next treatment     Plan Discharge plan remains appropriate       AM-PAC OT "6 Clicks" Daily Activity     Outcome Measure   Help from another person eating meals?: A Lot Help from another person taking care of personal grooming?: A Lot Help from another person toileting, which includes using toliet, bedpan, or urinal?: Total Help from another person bathing (  including washing, rinsing, drying)?: A Lot Help from another person to put on and taking off regular upper body clothing?: A Lot Help from another person to put on and taking off regular lower body clothing?: Total 6 Click Score: 10    End of Session Equipment Utilized During Treatment: Rolling walker;Gait belt  OT Visit Diagnosis: Unsteadiness on feet (R26.81);Other abnormalities of gait and mobility (R26.89);Muscle weakness (generalized) (M62.81)   Activity Tolerance Patient tolerated treatment well   Patient Left in chair;with call bell/phone within reach;with chair alarm set;with family/visitor present   Nurse Communication Mobility status        Time: 1150-1206 OT Time Calculation (min): 16 min  Charges: OT General Charges $OT Visit: 1 Visit OT Treatments $Self Care/Home Management : 8-22 mins  Kari Baars, [place] [phone] Office- [place], Edwena Felty D 01/08/2019, 2:01 PM

## 2019-01-08 NOTE — Progress Notes (Signed)
[place] [facility] Office:  [phone] General Surgery Progress Note   LOS: 7 days  POD -  2 Days Post-Op  Chief Complaint: Abdominal pain  Assessment and Plan: 1.    Laparoscopic cholecystectomy, laparoscopic lysis of adhesion x 45 minutes, placement of drain, 01/02/2019 - Dr. Michael Boston             -Postop bile leak; HIDA scan positive; 01/05/2019             -ERCP 1/15; initial cholangiogram shows a cystic duct leak, biliary sphincterotomy placement of a 10 [place] by 7 cm plastic stent, Dr. Lizbeth Bark  JP drainage still a fair amount - 605 cc recorded last 24 hours   2.  Hypertension stage II 3.  Combined systolic and diastolic congestive heart failure 4.  CAD/PAD 5.  CKD III  Creatinine - 2.39 - 01/08/2019 6.  COPD/history of tobacco use 7.  Post op confusion 8.  DVT prophylaxis - on hold 9.  Thrombocytopenia - better  Plts - 130,000 - 01/08/2019   Principal Problem:   Delayed cystic duct stump bile leak s/p ERCP/stent 01/06/2019 Active Problems:   Essential hypertension   CAD S/P percutaneous coronary angioplasty - DES in RCA with PTCA for ISR; Moderate mLAD, 80% ostial OM2 stable   H/O Inferior STEMI (09/2013) emergent PCI with Promus DES to RCA (3.0 mm  x 28 mm - 3.2 mm)   GERD (gastroesophageal reflux disease)   Malnutrition of moderate degree   Benign prostatic hyperplasia   Acute gangrenous cholecystitis s/p lap cholecystectomy 01/02/2019   Unintentional weight loss   History of medication noncompliance   History of partial gastrectomy   Pill dysphagia   CKD (chronic kidney disease), stage III (HCC)   Protein-calorie malnutrition, severe  Subjective:  Sedated.    Objective:   Vitals:   01/08/19 0008 01/08/19 0612  BP: (!) 184/88 (!) 176/93  Pulse: 87 93  Resp: 18 16  Temp: 98.3 F (36.8 C)   SpO2: 100% 97%     Intake/Output from previous day:  01/16 0701 - 01/17 0700 In: -  Out: 1005 [Urine:400; Drains:605]  Intake/Output this shift:  No  intake/output data recorded.   Physical Exam:   General: Thin older WM who has been confused.     HEENT: Normal. Pupils equal. .   Lungs: Clear.   Abdomen: Soft.  Has a few BS.   Wound: Clean Drain in mid abdomen - 605 cc recorded last 24 hours   Lab Results:    Recent Labs    01/07/19 1343 01/08/19 0816  WBC 9.5 10.0  HGB 10.4* 11.2*  HCT 33.4* 34.4*  PLT 109* 130*    BMET   Recent Labs    01/07/19 1343 01/08/19 0816  NA 147* 151*  K 5.4* 5.2*  CL 125* 126*  CO2 13* 15*  GLUCOSE 108* 103*  BUN 64* 58*  CREATININE 2.55* 2.39*  CALCIUM 8.5* 8.7*    PT/INR  No results for input(s): LABPROT, INR in the last 72 hours.  ABG  No results for input(s): PHART, HCO3 in the last 72 hours.  Invalid input(s): PCO2, PO2   Studies/Results:  Dg Ercp  Result Date: 01/06/2019 CLINICAL DATA:  Bile duct leak. EXAM: ERCP TECHNIQUE: Multiple spot images obtained with the fluoroscopic device and submitted for interpretation post-procedure. COMPARISON:  right upper quadrant abdominal ultrasound - 01/01/2019; CT abdomen and pelvis - 01/01/2019; nuclear medicine HIDA scan - 01/05/2019 FINDINGS: Four spot intraoperative fluoroscopic  images of the right upper abdominal quadrant during ERCP are provided for review Initial image demonstrates an ERCP probe overlying the right upper abdominal quadrant. There is selective cannulation and opacification of the common bile duct. Multiple surgical clips overlie the expected location of the gallbladder fossa. There is a large bore surgical drainage catheter which also overlies expected location of the gallbladder fossa and caudal aspect of the right lobe of the liver. Subsequent images demonstrate further opacification of the common bile duct which appears moderately dilated. Ultimately there is opacification of the cystic duct with extravasation of contrast adjacent to the cholecystectomy clips and surgical drainage catheter. Completion image demonstrates  placement of a internal biliary stent traversing the confluence of the cystic duct. IMPRESSION: ERCP with findings suggestive of residual cystic duct bile leak with subsequent placement of an internal biliary stent. These images were submitted for radiologic interpretation only. Please see the procedural report for the amount of contrast and the fluoroscopy time utilized. Electronically Signed   By: Sandi Mariscal M.D.   On: 01/06/2019 10:02     Anti-infectives:   Anti-infectives (From admission, onward)   Start     Dose/Rate Route Frequency Ordered Stop   01/02/19 1400  piperacillin-tazobactam (ZOSYN) IVPB 2.25 g  Status:  Discontinued     2.25 g 100 mL/hr over 30 Minutes Intravenous Every 8 hours 01/02/19 1223 01/02/19 1224   01/02/19 1400  piperacillin-tazobactam (ZOSYN) IVPB 2.25 g     2.25 g 100 mL/hr over 30 Minutes Intravenous Every 8 hours 01/02/19 1224 01/07/19 0650   01/02/19 0909  ceFAZolin (ANCEF) 2-4 GM/100ML-% IVPB  Status:  Discontinued    Note to Pharmacy:  Guerry Bruin   : cabinet override      01/02/19 0909 01/02/19 0916   01/02/19 0600  ceFAZolin (ANCEF) IVPB 2g/100 mL premix  Status:  Discontinued     2 g 200 mL/hr over 30 Minutes Intravenous On call to O.R. 01/01/19 1909 01/02/19 1218   01/02/19 0600  metroNIDAZOLE (FLAGYL) IVPB 500 mg     500 mg 100 mL/hr over 60 Minutes Intravenous On call to O.R. 01/01/19 1909 01/02/19 0836   01/01/19 1730  cefTRIAXone (ROCEPHIN) 2 g in sodium chloride 0.9 % 100 mL IVPB  Status:  Discontinued     2 g 200 mL/hr over 30 Minutes Intravenous  Once 01/01/19 1728 01/02/19 1218      Alphonsa Overall, MD, FACS Pager: [facility] Office: [phone] 01/08/2019

## 2019-01-08 NOTE — Progress Notes (Signed)
Unable to give any evening meds d/t patient refusal. Patient stated "I know you are trying to poison me"  Writer reoriented patient to reality, but patient would not take any medications. Had to waste all meds because writer crushed them into applesauce. Witnessed by [employer].   Will continue to monitor

## 2019-01-08 NOTE — Progress Notes (Signed)
[facility] Gastroenterology Progress Note  Stephen Baruch 85 y.o. 1930-05-06  CC: Bile leak   Subjective: No acute issues noted.  Biliary output remains little bit higher yesterday  but this morning it was only 75 cc ( 7 am to 12 noon).     Objective: Vital signs in last 24 hours: Vitals:   01/08/19 1110 01/08/19 1150  BP: (!) 180/67 (!) 167/82  Pulse: 62 78  Resp:  18  Temp:  98.3 F (36.8 C)  SpO2: 100% 100%    Physical Exam:  General.  Resting comfortably in the bed.  Not in acute distress Abd :.  Soft, nontender, nondistended, bowel sounds present.  No peritoneal signs  Lab Results: Recent Labs    01/07/19 1343 01/08/19 0816  NA 147* 151*  K 5.4* 5.2*  CL 125* 126*  CO2 13* 15*  GLUCOSE 108* 103*  BUN 64* 58*  CREATININE 2.55* 2.39*  CALCIUM 8.5* 8.7*   Recent Labs    01/06/19 0531 01/07/19 1343  AST 22 23  ALT 13 14  ALKPHOS 59 97  BILITOT 1.1 1.2  PROT 5.5* 5.4*  ALBUMIN 2.5* 2.7*   Recent Labs    01/07/19 1343 01/08/19 0816  WBC 9.5 10.0  HGB 10.4* 11.2*  HCT 33.4* 34.4*  MCV 105.7* 101.2*  PLT 109* 130*   No results for input(s): LABPROT, INR in the last 72 hours.    Assessment/Plan: -Bile leak status post laparoscopic cholecystectomy.  ERCP yesterday with sphincterotomy and plastic stent placement.(10 [place] by 7 cm).  output improving. -Confusion Multiple medical issues.  Recommendations ------------------------ - x-ray for CBD stent position. -If output remains high, consider CT abdomen pelvis for further evaluation. -GI will follow  Otis Brace MD, [place] 01/08/2019, 2:51 PM  Contact #  [phone]

## 2019-01-08 NOTE — Progress Notes (Signed)
PROGRESS NOTE    Nicholas Porter  DTO:[id number] DOB: [birth year] DOA: 01/01/2019 PCP: Dorothyann Peng, NP  Brief Narrative:85 y.o.malehas medical history relevant for CAD with prior angioplasty and stenting, PAD with prior left carotid endodiathermy, COPD/patient is a reformed smoker who quit smoking about 50 years ago as well ash/o partial gastrectomy/vagatomy in distant past for ulcer. LAst EGD 2009 with mild gastritis ,CKDIII, unintentional weight loss 60 lbs last year admitted on 01/01/2019 with abdominal pain and found to have calculus cholecystitis, status post lap chole and lysis of adhesions on 01/02/2019 by Dr. Johney Maine, patient subsequently developed AKI,JP drain with high output, HIDA scan on 01/05/2019 with active bile leak to the OR on 01/06/2019   Assessment & Plan:   Principal Problem:   Delayed cystic duct stump bile leak s/p ERCP/stent 01/06/2019 Active Problems:   Essential hypertension   CAD S/P percutaneous coronary angioplasty - DES in RCA with PTCA for ISR; Moderate mLAD, 80% ostial OM2 stable   H/O Inferior STEMI (09/2013) emergent PCI with Promus DES to RCA (3.0 mm  x 28 mm - 3.2 mm)   GERD (gastroesophageal reflux disease)   Malnutrition of moderate degree   Benign prostatic hyperplasia   Acute gangrenous cholecystitis s/p lap cholecystectomy 01/02/2019   Unintentional weight loss   History of medication noncompliance   History of partial gastrectomy   Pill dysphagia   CKD (chronic kidney disease), stage III (HCC)   Protein-calorie malnutrition, severe   1)Acute calculus cholecystitis-----complicated by active bile leak,JP drain with high output, HIDA scan on 01/05/2019 with active bile leak to the OR on 01/06/2019, status post 5 days of IV antibiotics Rocephin and Flagyl.  S/P ERCP sphinctererotomy and stent in CBD.  2)HTN--stage II, stable, continue. Imdur 30 mg daily along with hydralazine 25 mg 3 times daily andmetoprolol 25 mg twice daily,may use IV  labetalol when necessary Every 4 hours for systolic blood pressure over 160 mmhg.prn hydralazine  4)HFpEF---patient with history of combined systolic and diastolic dysfunction CHF, repeat echo with preserved EF of 55 to 60% with grade 1 dCHF in the setting of known CAD and possible ischemic cardiomyopathy---be judicious with IV fluids to avoid volume overload, continue metoprolol, isosorbide/hydralazine combo, , QTC on EKG is normalized  5)H/o CAD and PAD--CAD with prior angioplasty and stenting, PAD with prior left carotid endarterecmy ,patient apparently is intolerant to Lipitor trial low-dose pravastatin, continue aspirin ----continue metoprolol, no ACS type symptoms at this time, EKG without ischemic changes, repeat echo with preserved EF of 55 to 60% with grade 1 dCHF  6)AKI----acute kidney injury on CKD stage - III creatinine improving continue or changed to half-normal saline due to hypernatremia  7) hypernatremia change fluids to half-normal saline 75 cc an hour and recheck labs tomorrow.  8) mild hyperkalemia improving without treatment monitor follow.  9) dementia with increasing confusion hallucination since surgery it has increased.  Avoid narcotics      Nutrition Problem: Severe Malnutrition Etiology: chronic illness(COPD, CKD, CHF)     Signs/Symptoms: severe fat depletion, severe muscle depletion, energy intake < or equal to 75% for > or equal to 1 month    Interventions: Boost Breeze, Ensure Enlive (each supplement provides 350kcal and 20 grams of protein), Magic cup  Estimated body mass index is 14.99 kg/m as calculated from the following:   Height as of this encounter: 5\' 7"  (1.702 m).   Weight as of this encounter: 43.4 kg.  DVT prophylaxis: scd Code Status:dnr Family Communication: No family in the  room today Disposition Plan:  Once hyponatremia corrected discharge to skilled nursing facility.  Consultants: General surgery  Procedures:    Antimicrobials none  Subjective: Patient in bed confused does not respond to commands or questions  Objective: Vitals:   01/07/19 1958 01/08/19 0008 01/08/19 0612 01/08/19 1110  BP: (!) 163/81 (!) 184/88 (!) 176/93 (!) 180/67  Pulse: 90 87 93 62  Resp: 18 18 16    Temp: 99.6 F (37.6 C) 98.3 F (36.8 C)    TempSrc: Axillary Axillary    SpO2: 100% 100% 97% 100%  Weight:      Height:        Intake/Output Summary (Last 24 hours) at 01/08/2019 1144 Last data filed at 01/08/2019 0500 Gross per 24 hour  Intake -  Output 805 ml  Net -805 ml   Filed Weights   01/01/19 1132 01/01/19 2020  Weight: 45.4 kg 43.4 kg    Examination:  General exam: Appears calm and comfortable  Respiratory system: Clear to auscultation. Respiratory effort normal. Cardiovascular system: S1 & S2 heard, RRR. No JVD, murmurs, rubs, gallops or clicks. No pedal edema. Gastrointestinal system: Abdomen is nondistended, soft and nontender. No organomegaly or masses felt. Normal bowel sounds heard. Central nervous system: Alert and oriented. No focal neurological deficits. Extremities: Symmetric 5 x 5 power. Skin: No rashes, lesions or ulcers Psychiatry: Judgement and insight appear normal. Mood & affect appropriate.     Data Reviewed: I have personally reviewed following labs and imaging studies  CBC: Recent Labs  Lab 01/01/19 1150 01/02/19 0458 01/03/19 1012 01/05/19 0735 01/07/19 1343 01/08/19 0816  WBC 11.2* 19.8* 8.5 6.9 9.5 10.0  NEUTROABS 9.7*  --  7.5  --   --   --   HGB 13.0 12.9* 11.3* 10.1* 10.4* 11.2*  HCT 38.5* 38.7* 35.0* 31.9* 33.4* 34.4*  MCV 97.5 100.5* 104.8* 106.0* 105.7* 101.2*  PLT 92* 89* 60* 71* 109* 742*   Basic Metabolic Panel: Recent Labs  Lab 01/02/19 1335 01/03/19 1012 01/04/19 0539 01/05/19 0735 01/05/19 0930 01/06/19 0531 01/07/19 1343 01/08/19 0816  NA 141 138 141 145 146*  --  147* 151*  K 4.9 4.5 4.6 4.3 5.0 4.9 5.4* 5.2*  CL 107 105 112* 117* 118*   --  125* 126*  CO2 21* 22 19* 17* 18*  --  13* 15*  GLUCOSE 148* 115* 96 123* 119*  --  108* 103*  BUN 59* 67* 73* 73* 73*  --  64* 58*  CREATININE 2.33* 2.70* 2.79* 2.63* 2.65* 2.61* 2.55* 2.39*  CALCIUM 8.6* 8.3* 8.3* 8.3* 8.5*  --  8.5* 8.7*  MG 1.6* 1.8  --   --   --   --   --   --    GFR: Estimated Creatinine Clearance: 13.6 mL/min (A) (by C-G formula based on SCr of 2.39 mg/dL (H)). Liver Function Tests: Recent Labs  Lab 01/02/19 1335 01/05/19 0735 01/05/19 0930 01/06/19 0531 01/07/19 1343  AST 98* 19 18 22 23   ALT 69* 15 14 13 14   ALKPHOS 75 49 49 59 97  BILITOT 0.9 0.9 0.9 1.1 1.2  PROT 6.4* 5.5* 5.7* 5.5* 5.4*  ALBUMIN 3.7 2.7* 2.8* 2.5* 2.7*   Recent Labs  Lab 01/01/19 1150 01/05/19 0930  LIPASE 41 76*   No results for input(s): AMMONIA in the last 168 hours. Coagulation Profile: No results for input(s): INR, PROTIME in the last 168 hours. Cardiac Enzymes: Recent Labs  Lab 01/02/19 0514 01/02/19 1335 01/02/19 2000  TROPONINI 0.05* 0.06* 0.05*   BNP (last 3 results) No results for input(s): PROBNP in the last 8760 hours. HbA1C: No results for input(s): HGBA1C in the last 72 hours. CBG: Recent Labs  Lab 01/02/19 0503 01/02/19 1131  GLUCAP 142* 148*   Lipid Profile: No results for input(s): CHOL, HDL, LDLCALC, TRIG, CHOLHDL, LDLDIRECT in the last 72 hours. Thyroid Function Tests: No results for input(s): TSH, T4TOTAL, FREET4, T3FREE, THYROIDAB in the last 72 hours. Anemia Panel: No results for input(s): VITAMINB12, FOLATE, FERRITIN, TIBC, IRON, RETICCTPCT in the last 72 hours. Sepsis Labs: No results for input(s): PROCALCITON, LATICACIDVEN in the last 168 hours.  Recent Results (from the past 240 hour(s))  Blood culture (routine x 2)     Status: None   Collection Time: 01/01/19  7:58 PM  Result Value Ref Range Status   Specimen Description   Final    BLOOD LEFT ANTECUBITAL Performed at [facility] [address]  [place], [place] [address]    Special Requests   Final    BOTTLES DRAWN AEROBIC AND ANAEROBIC Blood Culture adequate volume Performed at [facility] [address], [place], [place] [address]    Culture   Final    NO GROWTH 5 DAYS Performed at [facility], [place] [address], [place], [place] [address]    Report Status 01/06/2019 FINAL  Final  Blood culture (routine x 2)     Status: None   Collection Time: 01/01/19  7:58 PM  Result Value Ref Range Status   Specimen Description BLOOD LEFT FOREARM  Final   Special Requests   Final    BOTTLES DRAWN AEROBIC AND ANAEROBIC Blood Culture adequate volume Performed at [facility] [address], [place], [place] [address]    Culture   Final    NO GROWTH 5 DAYS Performed at [facility], [place] [place] [address], [place], [place] [address]    Report Status 01/06/2019 FINAL  Final  Surgical pcr screen     Status: None   Collection Time: 01/02/19  5:57 AM  Result Value Ref Range Status   MRSA, PCR NEGATIVE NEGATIVE Final   Staphylococcus aureus NEGATIVE NEGATIVE Final    Comment: (NOTE) The Xpert SA Assay (FDA approved for NASAL specimens in patients 80 years of age and older), is one component of a comprehensive surveillance program. It is not intended to diagnose infection nor to guide or monitor treatment. Performed at [facility], [place] [address], [place], [place] [address]          Radiology Studies: No results found.      Scheduled Meds: . aspirin EC  81 mg Oral Daily  . brinzolamide  1 drop Left Eye Q12H  . calcium-vitamin D  1 tablet Oral QODAY  . Chlorhexidine Gluconate Cloth  6 each Topical Once  . feeding supplement  1 Container Oral TID BM  . feeding supplement (ENSURE ENLIVE)  237 mL Oral BID BM  . hydrALAZINE  25 mg Oral TID  . isosorbide mononitrate  30 mg Oral Daily  . latanoprost  1 drop Both Eyes QHS  . lip balm  1 application Topical BID  .  metoprolol tartrate  25 mg Oral BID  . mirtazapine  7.5 mg Oral QHS  . multivitamin with minerals  1 tablet Oral Daily  . polyethylene glycol  17 g Oral BID  . pravastatin  20 mg Oral q1800   Continuous Infusions: .  sodium chloride    . methocarbamol (ROBAXIN) IV    . ondansetron (ZOFRAN) IV       LOS: 7 days     Georgette Shell, MD [facility]  If 7PM-7AM, please contact night-coverage [email or web address] Password [facility] 01/08/2019, 11:44 AM

## 2019-01-09 LAB — CBC
HCT: 34.1 % — ABNORMAL LOW (ref 39.0–52.0)
Hemoglobin: 11 g/dL — ABNORMAL LOW (ref 13.0–17.0)
MCH: 33.2 pg (ref 26.0–34.0)
MCHC: 32.3 g/dL (ref 30.0–36.0)
MCV: 103 fL — AB (ref 80.0–100.0)
Platelets: 145 10*3/uL — ABNORMAL LOW (ref 150–400)
RBC: 3.31 MIL/uL — ABNORMAL LOW (ref 4.22–5.81)
RDW: 14 % (ref 11.5–15.5)
WBC: 10 10*3/uL (ref 4.0–10.5)
nRBC: 0 % (ref 0.0–0.2)

## 2019-01-09 LAB — BASIC METABOLIC PANEL
ANION GAP: 10 (ref 5–15)
BUN: 52 mg/dL — ABNORMAL HIGH (ref 8–23)
CALCIUM: 8.8 mg/dL — AB (ref 8.9–10.3)
CO2: 16 mmol/L — ABNORMAL LOW (ref 22–32)
Chloride: 123 mmol/L — ABNORMAL HIGH (ref 98–111)
Creatinine, Ser: 2.02 mg/dL — ABNORMAL HIGH (ref 0.61–1.24)
GFR calc Af Amer: 34 mL/min — ABNORMAL LOW (ref >60)
GFR calc non Af Amer: 29 mL/min — ABNORMAL LOW (ref >60)
Glucose, Bld: 116 mg/dL — ABNORMAL HIGH (ref 70–99)
Potassium: 4.6 mmol/L (ref 3.5–5.1)
Sodium: 149 mmol/L — ABNORMAL HIGH (ref 135–145)

## 2019-01-09 MED ORDER — ACETAMINOPHEN 325 MG PO TABS
650.0000 mg | ORAL_TABLET | Freq: Four times a day (QID) | ORAL | Status: DC | PRN
  Administered 2019-01-11: 650 mg via ORAL
  Filled 2019-01-09: qty 2

## 2019-01-09 MED ORDER — HALOPERIDOL LACTATE 5 MG/ML IJ SOLN: 2.0000 mg | Freq: Once | INTRAMUSCULAR | Status: DC

## 2019-01-09 NOTE — Progress Notes (Signed)
PROGRESS NOTE    Nicholas Porter  XQJ:[id number] DOB: Aug 07, 1930 DOA: 01/01/2019 PCP: Dorothyann Peng, NP  Brief Narrative: 85 y.o.malehas medical history relevant for CAD with prior angioplasty and stenting, PAD with prior left carotid endodiathermy, COPD/patient is a reformed smoker who quit smoking about 50 years ago as well ash/o partial gastrectomy/vagatomy in distant past for ulcer. LAst EGD 2009 with mild gastritis ,CKDIII, unintentional weight loss 60 lbs last year admitted on 01/01/2019 with abdominal pain and found to have calculus cholecystitis, status post lap chole and lysis of adhesions on 01/02/2019 by Dr. Johney Maine, patient subsequently developed AKI,JP drain with high output, HIDA scan on 01/05/2019 with active bile leak to the OR on 01/06/2019  01/11/2019 patient seen and examined wife by the bedside he is more awake than yesterday still confused Wife reports that at baseline he walked in the house outside the house. Assessment & Plan:   Principal Problem:   Delayed cystic duct stump bile leak s/p ERCP/stent 01/06/2019 Active Problems:   Essential hypertension   CAD S/P percutaneous coronary angioplasty - DES in RCA with PTCA for ISR; Moderate mLAD, 80% ostial OM2 stable   H/O Inferior STEMI (09/2013) emergent PCI with Promus DES to RCA (3.0 mm  x 28 mm - 3.2 mm)   GERD (gastroesophageal reflux disease)   Malnutrition of moderate degree   Benign prostatic hyperplasia   Acute gangrenous cholecystitis s/p lap cholecystectomy 01/02/2019   Unintentional weight loss   History of medication noncompliance   History of partial gastrectomy   Pill dysphagia   CKD (chronic kidney disease), stage III (HCC)   Protein-calorie malnutrition, severe   1)Acute calculus cholecystitis-----complicated by active bile leak,JP drain with high output, HIDA scan on 01/05/2019 with active bile leak to the OR on 01/06/2019,status post 5 days of IV antibiotics Rocephin and Flagyl.  S/P ERCP  sphinctererotomy and stent in CBD.  2)HTN--stage II, stable, continue. Imdur 30 mg daily along with hydralazine 25 mg 3 times daily andmetoprolol 25 mg twice daily,may use IV labetalol when necessary Every 4 hours for systolic blood pressure over 160 mmhg.prn hydralazine  4)HFpEF---patient with history of combined systolic and diastolic dysfunction CHF, repeat echo with preserved EF of 55 to 60% with grade 1 dCHF in the setting of known CAD and possible ischemic cardiomyopathy---be judicious with IV fluids to avoid volume overload, continue metoprolol, isosorbide/hydralazine combo, , QTC on EKG is normalized  5)H/o CAD and PAD--CAD with prior angioplasty and stenting, PAD with prior left carotid endarterecmy ,patient apparently is intolerant to Lipitor trial low-dose pravastatin, continue aspirin ----continue metoprolol, no ACS type symptoms at this time, EKG without ischemic changes, repeat echo with preserved EF of 55 to 60% with grade 1 dCHF  6)AKI----acute kidney injury on CKD stage - III creatinine improved to 2.02.  7) hypernatremia continue half-normal saline sodium better.    8) mild hyperkalemia resolved with IV hydration  9] thrombocytopenia improving. Nutrition Problem: Severe Malnutrition Etiology: chronic illness(COPD, CKD, CHF)     Signs/Symptoms: severe fat depletion, severe muscle depletion, energy intake < or equal to 75% for > or equal to 1 month    Interventions: Boost Breeze, Ensure Enlive (each supplement provides 350kcal and 20 grams of protein), Magic cup  Estimated body mass index is 14.99 kg/m as calculated from the following:   Height as of this encounter: 5\' 7"  (1.702 m).   Weight as of this encounter: 43.4 kg.  DVT prophylaxis: SCD due to thrombocytopenia and bleeding Code Status DNR Family  Communication: Discussed with wife Disposition Plan: Pending clinical improvement Consultants:   GI and general surgery Procedures: Lap  cholecystectomy with lysis of adhesions 01/02/2019 ERCP 01/06/2019 biliary sphincterotomy 01/06/2019 Antimicrobials: None Subjective: Resting in bed more awake than yesterday when asked if urine pain he said I am in pain keeps eyes closed wife by the bedside  Objective: Vitals:   01/08/19 1623 01/08/19 2100 01/09/19 0700 01/09/19 1315  BP: (!) 170/73 (!) 146/59 119/90 (!) 152/83  Pulse: 65 69 73 65  Resp:  20 16 20   Temp: 98.4 F (36.9 C) 99.6 F (37.6 C) 98.4 F (36.9 C) 98.6 F (37 C)  TempSrc: Oral Axillary Axillary Oral  SpO2: 97% 96% 100% 95%  Weight:      Height:        Intake/Output Summary (Last 24 hours) at 01/09/2019 1346 Last data filed at 01/09/2019 0400 Gross per 24 hour  Intake 1884.95 ml  Output 610 ml  Net 1274.95 ml   Filed Weights   01/01/19 1132 01/01/19 2020  Weight: 45.4 kg 43.4 kg    Examination:  General exam: Appears calm and comfortable  Respiratory system: Clear to auscultation. Respiratory effort normal. Cardiovascular system: S1 & S2 heard, RRR. No JVD, murmurs, rubs, gallops or clicks. No pedal edema. Gastrointestinal system: Abdomen is nondistended, soft and nontender. No organomegaly or masses felt. Normal bowel sounds heard. Central nervous system: Alert and oriented. No focal neurological deficits. Extremities: Symmetric 5 x 5 power. Skin: No rashes, lesions or ulcers Psychiatry: Judgement and insight appear normal. Mood & affect appropriate.     Data Reviewed: I have personally reviewed following labs and imaging studies  CBC: Recent Labs  Lab 01/03/19 1012 01/05/19 0735 01/07/19 1343 01/08/19 0816 01/09/19 0846  WBC 8.5 6.9 9.5 10.0 10.0  NEUTROABS 7.5  --   --   --   --   HGB 11.3* 10.1* 10.4* 11.2* 11.0*  HCT 35.0* 31.9* 33.4* 34.4* 34.1*  MCV 104.8* 106.0* 105.7* 101.2* 103.0*  PLT 60* 71* 109* 130* 671*   Basic Metabolic Panel: Recent Labs  Lab 01/03/19 1012  01/05/19 0735 01/05/19 0930 01/06/19 0531  01/07/19 1343 01/08/19 0816 01/09/19 0846  NA 138   < > 145 146*  --  147* 151* 149*  K 4.5   < > 4.3 5.0 4.9 5.4* 5.2* 4.6  CL 105   < > 117* 118*  --  125* 126* 123*  CO2 22   < > 17* 18*  --  13* 15* 16*  GLUCOSE 115*   < > 123* 119*  --  108* 103* 116*  BUN 67*   < > 73* 73*  --  64* 58* 52*  CREATININE 2.70*   < > 2.63* 2.65* 2.61* 2.55* 2.39* 2.02*  CALCIUM 8.3*   < > 8.3* 8.5*  --  8.5* 8.7* 8.8*  MG 1.8  --   --   --   --   --   --   --    < > = values in this interval not displayed.   GFR: Estimated Creatinine Clearance: 16.1 mL/min (A) (by C-G formula based on SCr of 2.02 mg/dL (H)). Liver Function Tests: Recent Labs  Lab 01/05/19 0735 01/05/19 0930 01/06/19 0531 01/07/19 1343  AST 19 18 22 23   ALT 15 14 13 14   ALKPHOS 49 49 59 97  BILITOT 0.9 0.9 1.1 1.2  PROT 5.5* 5.7* 5.5* 5.4*  ALBUMIN 2.7* 2.8* 2.5* 2.7*   Recent Labs  Lab 01/05/19 0930  LIPASE 76*   No results for input(s): AMMONIA in the last 168 hours. Coagulation Profile: No results for input(s): INR, PROTIME in the last 168 hours. Cardiac Enzymes: Recent Labs  Lab 01/02/19 2000  TROPONINI 0.05*   BNP (last 3 results) No results for input(s): PROBNP in the last 8760 hours. HbA1C: No results for input(s): HGBA1C in the last 72 hours. CBG: No results for input(s): GLUCAP in the last 168 hours. Lipid Profile: No results for input(s): CHOL, HDL, LDLCALC, TRIG, CHOLHDL, LDLDIRECT in the last 72 hours. Thyroid Function Tests: No results for input(s): TSH, T4TOTAL, FREET4, T3FREE, THYROIDAB in the last 72 hours. Anemia Panel: No results for input(s): VITAMINB12, FOLATE, FERRITIN, TIBC, IRON, RETICCTPCT in the last 72 hours. Sepsis Labs: No results for input(s): PROCALCITON, LATICACIDVEN in the last 168 hours.  Recent Results (from the past 240 hour(s))  Blood culture (routine x 2)     Status: None   Collection Time: 01/01/19  7:58 PM  Result Value Ref Range Status   Specimen Description    Final    BLOOD LEFT ANTECUBITAL Performed at [facility] [address] [place], [place] [address]    Special Requests   Final    BOTTLES DRAWN AEROBIC AND ANAEROBIC Blood Culture adequate volume Performed at [facility] [address], [place], [place] [address]    Culture   Final    NO GROWTH 5 DAYS Performed at [facility], [place] [address] [place], [place] [address]    Report Status 01/06/2019 FINAL  Final  Blood culture (routine x 2)     Status: None   Collection Time: 01/01/19  7:58 PM  Result Value Ref Range Status   Specimen Description BLOOD LEFT FOREARM  Final   Special Requests   Final    BOTTLES DRAWN AEROBIC AND ANAEROBIC Blood Culture adequate volume Performed at [facility] [address] [place], [place] [address]    Culture   Final    NO GROWTH 5 DAYS Performed at [facility], [place] [address], [place], [place] [address]    Report Status 01/06/2019 FINAL  Final  Surgical pcr screen     Status: None   Collection Time: 01/02/19  5:57 AM  Result Value Ref Range Status   MRSA, PCR NEGATIVE NEGATIVE Final   Staphylococcus aureus NEGATIVE NEGATIVE Final    Comment: (NOTE) The Xpert SA Assay (FDA approved for NASAL specimens in patients 53 years of age and older), is one component of a comprehensive surveillance program. It is not intended to diagnose infection nor to guide or monitor treatment. Performed at [facility], [place] [address] [place], [place] [address]          Radiology Studies: Dg Abd Portable 1v  Result Date: 01/08/2019 CLINICAL DATA:  Status post biliary stent placement. Question displacement. EXAM: PORTABLE ABDOMEN - 1 VIEW COMPARISON:  CT abdomen and pelvis 01/01/2019. Images from ERCP 01/06/2019 reviewed. FINDINGS: Biliary stent is in place. Although its course is atypical with the distal end of the stent crossing the midline, it is likely in good  position based on correlation with prior CT. Bowel gas pattern is unremarkable. IMPRESSION: Exact position of the biliary tract exact position of the patient's biliary stent can not be determined on this examination but it is likely in good position based on correlation with prior CT. Electronically Signed   By: Inge Rise M.D.  On: 01/08/2019 15:35        Scheduled Meds: . aspirin EC  81 mg Oral Daily  . brinzolamide  1 drop Left Eye Q12H  . calcium-vitamin D  1 tablet Oral QODAY  . feeding supplement  1 Container Oral TID BM  . feeding supplement (ENSURE ENLIVE)  237 mL Oral BID BM  . haloperidol lactate  2 mg Intravenous Once  . hydrALAZINE  25 mg Oral TID  . isosorbide mononitrate  30 mg Oral Daily  . latanoprost  1 drop Both Eyes QHS  . lip balm  1 application Topical BID  . metoprolol tartrate  25 mg Oral BID  . mirtazapine  7.5 mg Oral QHS  . multivitamin with minerals  1 tablet Oral Daily  . polyethylene glycol  17 g Oral BID  . pravastatin  20 mg Oral q1800   Continuous Infusions: . sodium chloride 75 mL/hr at 01/09/19 1307  . methocarbamol (ROBAXIN) IV    . ondansetron (ZOFRAN) IV       LOS: 8 days     Georgette Shell, MD [facility]  If 7PM-7AM, please contact night-coverage [email or web address] Password [email or web address] 01/09/2019, 1:46 PM

## 2019-01-09 NOTE — Progress Notes (Signed)
[facility]:  [phone] General Surgery Progress Note   LOS: 8 days  POD -  3 Days Post-Op  Chief Complaint: Abdominal pain  Assessment and Plan: 1.    Laparoscopic cholecystectomy, laparoscopic lysis of adhesion x 45 minutes, placement of drain, 01/02/2019 - Dr. Michael Boston             -Postop bile leak; HIDA scan positive; 01/05/2019             -ERCP 1/15; initial cholangiogram shows a cystic duct leak, biliary sphincterotomy placement of a 10 French by 7 cm plastic stent, Dr. Lizbeth Bark  JP drainage still bilious, decreasing volume - 185 yesterday vs  605 cc recorded the day before. Continue to monitor.    2.  Hypertension stage II 3.  Combined systolic and diastolic congestive heart failure 4.  CAD/PAD 5.  CKD III  Creatinine - 2.39 - 01/08/2019 6.  COPD/history of tobacco use 7.  Post op confusion 8.  DVT prophylaxis - on hold 9.  Thrombocytopenia - better  Plts - 130,000 - 01/08/2019   Principal Problem:   Delayed cystic duct stump bile leak s/p ERCP/stent 01/06/2019 Active Problems:   Essential hypertension   CAD S/P percutaneous coronary angioplasty - DES in RCA with PTCA for ISR; Moderate mLAD, 80% ostial OM2 stable   H/O Inferior STEMI (09/2013) emergent PCI with Promus DES to RCA (3.0 mm  x 28 mm - 3.2 mm)   GERD (gastroesophageal reflux disease)   Malnutrition of moderate degree   Benign prostatic hyperplasia   Acute gangrenous cholecystitis s/p lap cholecystectomy 01/02/2019   Unintentional weight loss   History of medication noncompliance   History of partial gastrectomy   Pill dysphagia   CKD (chronic kidney disease), stage III (HCC)   Protein-calorie malnutrition, severe  Subjective:  Sedated.    Objective:   Vitals:   01/08/19 2100 01/09/19 0700  BP: (!) 146/59 119/90  Pulse: 69 73  Resp: 20 16  Temp: 99.6 F (37.6 C) 98.4 F (36.9 C)  SpO2: 96% 100%     Intake/Output from previous day:  01/17 0701 - 01/18 0700 In:  2040.4 [P.O.:480; I.V.:1560.4] Out: 685 [Urine:500; Drains:185]  Intake/Output this shift:  No intake/output data recorded.   Physical Exam:   General: Thin older WM, delirious.     HEENT: Normal. Pupils equal. .   Lungs: Clear.   Abdomen: Soft.  Has a few BS.   Wound: Clean Drain in mid abdomen - bilious output   Lab Results:    Recent Labs    01/07/19 1343 01/08/19 0816  WBC 9.5 10.0  HGB 10.4* 11.2*  HCT 33.4* 34.4*  PLT 109* 130*    BMET   Recent Labs    01/07/19 1343 01/08/19 0816  NA 147* 151*  K 5.4* 5.2*  CL 125* 126*  CO2 13* 15*  GLUCOSE 108* 103*  BUN 64* 58*  CREATININE 2.55* 2.39*  CALCIUM 8.5* 8.7*    PT/INR  No results for input(s): LABPROT, INR in the last 72 hours.  ABG  No results for input(s): PHART, HCO3 in the last 72 hours.  Invalid input(s): PCO2, PO2   Studies/Results:  Dg Abd Portable 1v  Result Date: 01/08/2019 CLINICAL DATA:  Status post biliary stent placement. Question displacement. EXAM: PORTABLE ABDOMEN - 1 VIEW COMPARISON:  CT abdomen and pelvis 01/01/2019. Images from ERCP 01/06/2019 reviewed. FINDINGS: Biliary stent is in place. Although its course is atypical with the distal  end of the stent crossing the midline, it is likely in good position based on correlation with prior CT. Bowel gas pattern is unremarkable. IMPRESSION: Exact position of the biliary tract exact position of the patient's biliary stent can not be determined on this examination but it is likely in good position based on correlation with prior CT. Electronically Signed   By: Inge Rise M.D.   On: 01/08/2019 15:35     Anti-infectives:   Anti-infectives (From admission, onward)   Start     Dose/Rate Route Frequency Ordered Stop   01/02/19 1400  piperacillin-tazobactam (ZOSYN) IVPB 2.25 g  Status:  Discontinued     2.25 g 100 mL/hr over 30 Minutes Intravenous Every 8 hours 01/02/19 1223 01/02/19 1224   01/02/19 1400  piperacillin-tazobactam (ZOSYN) IVPB  2.25 g     2.25 g 100 mL/hr over 30 Minutes Intravenous Every 8 hours 01/02/19 1224 01/07/19 0650   01/02/19 0909  ceFAZolin (ANCEF) 2-4 GM/100ML-% IVPB  Status:  Discontinued    Note to Pharmacy:  Guerry Bruin   : cabinet override      01/02/19 0909 01/02/19 0916   01/02/19 0600  ceFAZolin (ANCEF) IVPB 2g/100 mL premix  Status:  Discontinued     2 g 200 mL/hr over 30 Minutes Intravenous On call to O.R. 01/01/19 1909 01/02/19 1218   01/02/19 0600  metroNIDAZOLE (FLAGYL) IVPB 500 mg     500 mg 100 mL/hr over 60 Minutes Intravenous On call to O.R. 01/01/19 1909 01/02/19 0836   01/01/19 1730  cefTRIAXone (ROCEPHIN) 2 g in sodium chloride 0.9 % 100 mL IVPB  Status:  Discontinued     2 g 200 mL/hr over 30 Minutes Intravenous  Once 01/01/19 1728 01/02/19 Manito MD, FACS 01/09/2019

## 2019-01-09 NOTE — Progress Notes (Signed)
[facility] Progress Note  Subjective: Bile leak. S/P biliary stent. JP drain amount decreasing.  Objective: Vital signs in last 24 hours: Temp:  [98.4 F (36.9 C)-99.6 F (37.6 C)] 98.6 F (37 C) (01/18 1315) Pulse Rate:  [65-73] 65 (01/18 1315) Resp:  [16-20] 20 (01/18 1315) BP: (119-170)/(59-90) 152/83 (01/18 1315) SpO2:  [95 %-100 %] 95 % (01/18 1315) Weight change:    PE:No distress  Lab Results: Results for orders placed or performed during the hospital encounter of 01/01/19 (from the past 24 hour(s))  Basic metabolic panel     Status: Abnormal   Collection Time: 01/09/19  8:46 AM  Result Value Ref Range   Sodium 149 (H) 135 - 145 mmol/L   Potassium 4.6 3.5 - 5.1 mmol/L   Chloride 123 (H) 98 - 111 mmol/L   CO2 16 (L) 22 - 32 mmol/L   Glucose, Bld 116 (H) 70 - 99 mg/dL   BUN 52 (H) 8 - 23 mg/dL   Creatinine, Ser 2.02 (H) 0.61 - 1.24 mg/dL   Calcium 8.8 (L) 8.9 - 10.3 mg/dL   GFR calc non Af Amer 29 (L) >60 mL/min   GFR calc Af Amer 34 (L) >60 mL/min   Anion gap 10 5 - 15  CBC     Status: Abnormal   Collection Time: 01/09/19  8:46 AM  Result Value Ref Range   WBC 10.0 4.0 - 10.5 K/uL   RBC 3.31 (L) 4.22 - 5.81 MIL/uL   Hemoglobin 11.0 (L) 13.0 - 17.0 g/dL   HCT 34.1 (L) 39.0 - 52.0 %   MCV 103.0 (H) 80.0 - 100.0 fL   MCH 33.2 26.0 - 34.0 pg   MCHC 32.3 30.0 - 36.0 g/dL   RDW 14.0 11.5 - 15.5 %   Platelets 145 (L) 150 - 400 K/uL   nRBC 0.0 0.0 - 0.2 %    Studies/Results: Dg Abd Portable 1v  Result Date: 01/08/2019 CLINICAL DATA:  Status post biliary stent placement. Question displacement. EXAM: PORTABLE ABDOMEN - 1 VIEW COMPARISON:  CT abdomen and pelvis 01/01/2019. Images from ERCP 01/06/2019 reviewed. FINDINGS: Biliary stent is in place. Although its course is atypical with the distal end of the stent crossing the midline, it is likely in good position based on correlation with prior CT. Bowel gas pattern is unremarkable. IMPRESSION: Exact  position of the biliary tract exact position of the patient's biliary stent can not be determined on this examination but it is likely in good position based on correlation with prior CT. Electronically Signed   By: Inge Rise M.D.   On: 01/08/2019 15:35      Assessment: Bile Leak. S/P biliary stent and decreasing JP drainage.  Plan:   Continue current plan. Nothing more for GI to do at this time. The leak takes time to heal with stent in. Will sign off for now. Call us if needed. Patient can follow up with Dr Watt Climes for stent removal in 6 weeks.    SAM F Edouard Gikas 01/09/2019, 2:04 PM  Pager: [phone] If no answer or after 5 PM call [phone]

## 2019-01-10 LAB — BASIC METABOLIC PANEL
Anion gap: 9 (ref 5–15)
BUN: 48 mg/dL — ABNORMAL HIGH (ref 8–23)
CO2: 15 mmol/L — AB (ref 22–32)
Calcium: 8.5 mg/dL — ABNORMAL LOW (ref 8.9–10.3)
Chloride: 121 mmol/L — ABNORMAL HIGH (ref 98–111)
Creatinine, Ser: 1.84 mg/dL — ABNORMAL HIGH (ref 0.61–1.24)
GFR calc non Af Amer: 32 mL/min — ABNORMAL LOW (ref >60)
GFR, EST AFRICAN AMERICAN: 38 mL/min — AB (ref >60)
Glucose, Bld: 115 mg/dL — ABNORMAL HIGH (ref 70–99)
Potassium: 4.5 mmol/L (ref 3.5–5.1)
Sodium: 145 mmol/L (ref 135–145)

## 2019-01-10 LAB — CBC
HCT: 31.5 % — ABNORMAL LOW (ref 39.0–52.0)
Hemoglobin: 10.2 g/dL — ABNORMAL LOW (ref 13.0–17.0)
MCH: 33.3 pg (ref 26.0–34.0)
MCHC: 32.4 g/dL (ref 30.0–36.0)
MCV: 102.9 fL — ABNORMAL HIGH (ref 80.0–100.0)
Platelets: 147 10*3/uL — ABNORMAL LOW (ref 150–400)
RBC: 3.06 MIL/uL — ABNORMAL LOW (ref 4.22–5.81)
RDW: 13.8 % (ref 11.5–15.5)
WBC: 9.3 10*3/uL (ref 4.0–10.5)
nRBC: 0 % (ref 0.0–0.2)

## 2019-01-10 MED ORDER — ISOSORBIDE MONONITRATE ER 60 MG PO TB24
60.0000 mg | ORAL_TABLET | Freq: Every day | ORAL | Status: DC
  Administered 2019-01-11: 60 mg via ORAL
  Filled 2019-01-10: qty 1

## 2019-01-10 MED ORDER — HYDRALAZINE HCL 50 MG PO TABS
50.0000 mg | ORAL_TABLET | Freq: Three times a day (TID) | ORAL | Status: DC
  Administered 2019-01-10 – 2019-01-11 (×3): 50 mg via ORAL
  Filled 2019-01-10 (×4): qty 1

## 2019-01-10 MED ORDER — ENOXAPARIN SODIUM 30 MG/0.3ML ~~LOC~~ SOLN: 30.0000 mg | SUBCUTANEOUS | Status: DC

## 2019-01-10 MED ORDER — HEPARIN SODIUM (PORCINE) 5000 UNIT/ML IJ SOLN
5000.0000 [IU] | Freq: Three times a day (TID) | INTRAMUSCULAR | Status: DC
  Administered 2019-01-10 – 2019-01-11 (×2): 5000 [IU] via SUBCUTANEOUS
  Filled 2019-01-10 (×2): qty 1

## 2019-01-10 NOTE — Progress Notes (Signed)
RN held Hydralazine due to notification on the [facility] stating it was too soon to give another dose of Hydralazine.

## 2019-01-10 NOTE — Progress Notes (Signed)
PROGRESS NOTE    Nicholas Porter  IOX:[id number] DOB: March 10, 1930 DOA: 01/01/2019 PCP: Dorothyann Peng, NP   Brief Narrative: 85 y.o.malehas medical history relevant for CAD with prior angioplasty and stenting, PAD with prior left carotid endodiathermy, COPD/patient is a reformed smoker who quit smoking about 50 years ago as well ash/o partial gastrectomy/vagatomy in distant past for ulcer. LAst EGD 2009 with mild gastritis ,CKDIII, unintentional weight loss 60 lbs last year admitted on 01/01/2019 with abdominal pain and found to have calculus cholecystitis, status post lap chole and lysis of adhesions on 01/02/2019 by Dr. Johney Maine, patient subsequently developed AKI,JP drain with high output, HIDA scan on 01/05/2019 with active bile leak to the OR on 01/06/2019  01/10/2019 patient seen and examined the room wife by the bedside had a lot of questions and concerns which were all answered.  She was upset why the patient is on hydralazine.  As he is allergic to it.  Allergy is noted as itching.  I did try to explain to her that her blood pressure was extremely high and we needed to help her help and bring it down with added her antihypertensives.  He is allergic to 3 different antihypertensives per chart.  Assessment & Plan:   Principal Problem:   Delayed cystic duct stump bile leak s/p ERCP/stent 01/06/2019 Active Problems:   Essential hypertension   CAD S/P percutaneous coronary angioplasty - DES in RCA with PTCA for ISR; Moderate mLAD, 80% ostial OM2 stable   H/O Inferior STEMI (09/2013) emergent PCI with Promus DES to RCA (3.0 mm  x 28 mm - 3.2 mm)   GERD (gastroesophageal reflux disease)   Malnutrition of moderate degree   Benign prostatic hyperplasia   Acute gangrenous cholecystitis s/p lap cholecystectomy 01/02/2019   Unintentional weight loss   History of medication noncompliance   History of partial gastrectomy   Pill dysphagia   CKD (chronic kidney disease), stage III (HCC)  Protein-calorie malnutrition, severe   1)Acute calculus cholecystitis-----complicated by active bile leak,JP drain with high output, HIDA scan on 01/05/2019 with active bile leak to the OR on 01/06/2019,status post 5 days of IV antibiotics Rocephin and Flagyl.  S/P ERCP sphinctererotomy and stent in CBD.  2)HTN--stage II, stable, continue. Imdur 30 mg daily along with hydralazine 25 mg 3 times daily andmetoprolol 25 mg twice daily,may use IV labetalol when necessary Every 4 hours for systolic blood pressure over 160 mmhg.prn hydralazine  4)HFpEF---patient with history of combined systolic and diastolic dysfunction CHF, repeat echo with preserved EF of 55 to 60% with grade 1 dCHF in the setting of known CAD and possible ischemic cardiomyopathy---be judicious with IV fluids to avoid volume overload, continue metoprolol, isosorbide/hydralazine combo, , QTC on EKG is normalized  5)H/o CAD and PAD--CAD with prior angioplasty and stenting, PAD with prior left carotid endarterecmy ,patient apparently is intolerant to Lipitor trial low-dose pravastatin, continue aspirin ----continue metoprolol, no ACS type symptoms at this time, EKG without ischemic changes, repeat echo with preserved EF of 55 to 60% with grade 1 dCHF  6)AKI----acute kidney injury on CKD stage -improved and back to baseline.  7)hypernatremia -resolved.    8)mild hyperkalemia resolved with IV hydration    9] thrombocytopenia resolved start Lovenox subcu for DVT prophylaxis.    Nutrition Problem: Severe Malnutrition Etiology: chronic illness(COPD, CKD, CHF)     Signs/Symptoms: severe fat depletion, severe muscle depletion, energy intake < or equal to 75% for > or equal to 1 month    Interventions: Boost Breeze,  Ensure Enlive (each supplement provides 350kcal and 20 grams of protein), Magic cup  Estimated body mass index is 14.99 kg/m as calculated from the following:   Height as of this encounter: 5'  7" (1.702 m).   Weight as of this encounter: 43.4 kg.  DVT prophylaxis: Lovenox Code Status: DO NOT RESUSCITATE Family Communication: Discussed with wife Disposition Plan: Patient needs to be placed to skilled nursing facility will DC hand mittens today will DC IV fluids later today  Consultants: GI and general surgery   Procedures: Laparoscopic cholecystectomy with lysis of adhesions and ERCP and biliary sphincterotomy Antimicrobials: None Subjective: Much more awake than yesterday wife by the bedside events overnight noted  Objective: Vitals:   01/10/19 0039 01/10/19 0112 01/10/19 0323 01/10/19 0413  BP: (!) 201/93 (!) 187/87 (!) 207/85 (!) 159/88  Pulse:   88 73  Resp:    18  Temp:    98.8 F (37.1 C)  TempSrc:    Oral  SpO2:    95%  Weight:      Height:        Intake/Output Summary (Last 24 hours) at 01/10/2019 1138 Last data filed at 01/10/2019 0600 Gross per 24 hour  Intake 1967.87 ml  Output 225 ml  Net 1742.87 ml   Filed Weights   01/01/19 1132 01/01/19 2020  Weight: 45.4 kg 43.4 kg    Examination:  General exam: Appears calm and comfortable  Respiratory system: Clear to auscultation. Respiratory effort normal. Cardiovascular system: S1 & S2 heard, RRR. No JVD, murmurs, rubs, gallops or clicks. No pedal edema. Gastrointestinal system: Abdomen is nondistended, soft and nontender. No organomegaly or masses felt. Normal bowel sounds heard.  Drain still in place with over 225 cc drainage yesterday. Central nervous system: Alert and oriented. No focal neurological deficits. Extremities: Symmetric 5 x 5 power. Skin: No rashes, lesions or ulcers Psychiatry: Judgement and insight appear normal. Mood & affect appropriate.     Data Reviewed: I have personally reviewed following labs and imaging studies  CBC: Recent Labs  Lab 01/05/19 0735 01/07/19 1343 01/08/19 0816 01/09/19 0846 01/10/19 0530  WBC 6.9 9.5 10.0 10.0 9.3  HGB 10.1* 10.4* 11.2* 11.0* 10.2*    HCT 31.9* 33.4* 34.4* 34.1* 31.5*  MCV 106.0* 105.7* 101.2* 103.0* 102.9*  PLT 71* 109* 130* 145* 867*   Basic Metabolic Panel: Recent Labs  Lab 01/05/19 0930 01/06/19 0531 01/07/19 1343 01/08/19 0816 01/09/19 0846 01/10/19 0530  NA 146*  --  147* 151* 149* 145  K 5.0 4.9 5.4* 5.2* 4.6 4.5  CL 118*  --  125* 126* 123* 121*  CO2 18*  --  13* 15* 16* 15*  GLUCOSE 119*  --  108* 103* 116* 115*  BUN 73*  --  64* 58* 52* 48*  CREATININE 2.65* 2.61* 2.55* 2.39* 2.02* 1.84*  CALCIUM 8.5*  --  8.5* 8.7* 8.8* 8.5*   GFR: Estimated Creatinine Clearance: 17.7 mL/min (A) (by C-G formula based on SCr of 1.84 mg/dL (H)). Liver Function Tests: Recent Labs  Lab 01/05/19 0735 01/05/19 0930 01/06/19 0531 01/07/19 1343  AST 19 18 22 23   ALT 15 14 13 14   ALKPHOS 49 49 59 97  BILITOT 0.9 0.9 1.1 1.2  PROT 5.5* 5.7* 5.5* 5.4*  ALBUMIN 2.7* 2.8* 2.5* 2.7*   Recent Labs  Lab 01/05/19 0930  LIPASE 76*   No results for input(s): AMMONIA in the last 168 hours. Coagulation Profile: No results for input(s): INR, PROTIME in the  last 168 hours. Cardiac Enzymes: No results for input(s): CKTOTAL, CKMB, CKMBINDEX, TROPONINI in the last 168 hours. BNP (last 3 results) No results for input(s): PROBNP in the last 8760 hours. HbA1C: No results for input(s): HGBA1C in the last 72 hours. CBG: No results for input(s): GLUCAP in the last 168 hours. Lipid Profile: No results for input(s): CHOL, HDL, LDLCALC, TRIG, CHOLHDL, LDLDIRECT in the last 72 hours. Thyroid Function Tests: No results for input(s): TSH, T4TOTAL, FREET4, T3FREE, THYROIDAB in the last 72 hours. Anemia Panel: No results for input(s): VITAMINB12, FOLATE, FERRITIN, TIBC, IRON, RETICCTPCT in the last 72 hours. Sepsis Labs: No results for input(s): PROCALCITON, LATICACIDVEN in the last 168 hours.  Recent Results (from the past 240 hour(s))  Blood culture (routine x 2)     Status: None   Collection Time: 01/01/19  7:58 PM   Result Value Ref Range Status   Specimen Description   Final    BLOOD LEFT ANTECUBITAL Performed at [facility] [address], [place], [place] [address]    Special Requests   Final    BOTTLES DRAWN AEROBIC AND ANAEROBIC Blood Culture adequate volume Performed at [facility] [address], [place], [place] [address]    Culture   Final    NO GROWTH 5 DAYS Performed at [facility], [place] [address] [place], [place] [address]    Report Status 01/06/2019 FINAL  Final  Blood culture (routine x 2)     Status: None   Collection Time: 01/01/19  7:58 PM  Result Value Ref Range Status   Specimen Description BLOOD LEFT FOREARM  Final   Special Requests   Final    BOTTLES DRAWN AEROBIC AND ANAEROBIC Blood Culture adequate volume Performed at [facility] [address] [place], [place] [address]    Culture   Final    NO GROWTH 5 DAYS Performed at [facility], [place] [address] [place], [place] [address]    Report Status 01/06/2019 FINAL  Final  Surgical pcr screen     Status: None   Collection Time: 01/02/19  5:57 AM  Result Value Ref Range Status   MRSA, PCR NEGATIVE NEGATIVE Final   Staphylococcus aureus NEGATIVE NEGATIVE Final    Comment: (NOTE) The Xpert SA Assay (FDA approved for NASAL specimens in patients 53 years of age and older), is one component of a comprehensive surveillance program. It is not intended to diagnose infection nor to guide or monitor treatment. Performed at [facility], [place] [address] [place], [place] [address]          Radiology Studies: Dg Abd Portable 1v  Result Date: 01/08/2019 CLINICAL DATA:  Status post biliary stent placement. Question displacement. EXAM: PORTABLE ABDOMEN - 1 VIEW COMPARISON:  CT abdomen and pelvis 01/01/2019. Images from ERCP 01/06/2019 reviewed. FINDINGS: Biliary stent is in place. Although its course is atypical with the distal  end of the stent crossing the midline, it is likely in good position based on correlation with prior CT. Bowel gas pattern is unremarkable. IMPRESSION: Exact position of the biliary tract exact position of the patient's biliary stent can not be determined on this examination but it is likely in good position based on correlation with prior CT. Electronically Signed   By: Inge Rise M.D.   On: 01/08/2019 15:35        Scheduled Meds: . aspirin EC  81 mg Oral Daily  . brinzolamide  1 drop Left Eye Q12H  . calcium-vitamin D  1 tablet Oral QODAY  . feeding supplement  1 Container Oral TID BM  . feeding supplement (ENSURE ENLIVE)  237 mL Oral BID BM  . haloperidol lactate  2 mg Intravenous Once  . hydrALAZINE  50 mg Oral TID  . isosorbide mononitrate  30 mg Oral Daily  . latanoprost  1 drop Both Eyes QHS  . lip balm  1 application Topical BID  . metoprolol tartrate  25 mg Oral BID  . multivitamin with minerals  1 tablet Oral Daily  . polyethylene glycol  17 g Oral BID  . pravastatin  20 mg Oral q1800   Continuous Infusions: . sodium chloride 75 mL/hr at 01/10/19 0246  . ondansetron (ZOFRAN) IV       LOS: 9 days     Georgette Shell, MD [facility]  If 7PM-7AM, please contact night-coverage [email or web address] Password [facility] 01/10/2019, 11:38 AM

## 2019-01-10 NOTE — Progress Notes (Addendum)
Pt's wife acting inappropriately during shift. During shift change pt's wife throws dirty linen in hallway and screams "my husband will not be laying in soiled sheets." Pt and wife advised to use the call bell when pt is soiled and that this behavior is unacceptable. During med pass as RN was going over medications, pt's wife states that he does not take hydralazine at home. Pt's wife is upset that no one informed her that this medication has been started. This RN explained indication of the medication and importance of it considering pt's BP 196/74. Despite education, wife still does not understand why patient is taking this medication. Wife believes this is the reason pt is acting the way that he is. Wife finally agrees to let RN administer BP medications. Wife's behavior continuously made pt more irritable throughout the shift. Will continue to monitor.

## 2019-01-10 NOTE — Progress Notes (Signed)
[facility]:  [phone] General Surgery Progress Note   LOS: 9 days  POD -  4 Days Post-Op  Chief Complaint: Abdominal pain  Assessment and Plan: 1.    Laparoscopic cholecystectomy, laparoscopic lysis of adhesion x 45 minutes, placement of drain, 01/02/2019 - Dr. Michael Boston             -Postop bile leak; HIDA scan positive; 01/05/2019             -ERCP 1/15; initial cholangiogram shows a cystic duct leak, biliary sphincterotomy placement of a 10 French by 7 cm plastic stent, Dr. Lizbeth Bark  JP drainage still bilious, stable volume - 225 yesterday. Continue to monitor. If output does not decrease/ remains bilious may require stent upsize    2.  Hypertension stage II 3.  Combined systolic and diastolic congestive heart failure 4.  CAD/PAD 5.  CKD III 6.  COPD/history of tobacco use 7.  Post op confusion 8.  DVT prophylaxis - on hold 9.  Thrombocytopenia - better 10. Delirium     Principal Problem:   Delayed cystic duct stump bile leak s/p ERCP/stent 01/06/2019 Active Problems:   Essential hypertension   CAD S/P percutaneous coronary angioplasty - DES in RCA with PTCA for ISR; Moderate mLAD, 80% ostial OM2 stable   H/O Inferior STEMI (09/2013) emergent PCI with Promus DES to RCA (3.0 mm  x 28 mm - 3.2 mm)   GERD (gastroesophageal reflux disease)   Malnutrition of moderate degree   Benign prostatic hyperplasia   Acute gangrenous cholecystitis s/p lap cholecystectomy 01/02/2019   Unintentional weight loss   History of medication noncompliance   History of partial gastrectomy   Pill dysphagia   CKD (chronic kidney disease), stage III (HCC)   Protein-calorie malnutrition, severe  Subjective:  Sleepy, asks me to stop talking   Objective:   Vitals:   01/10/19 0323 01/10/19 0413  BP: (!) 207/85 (!) 159/88  Pulse: 88 73  Resp:  18  Temp:  98.8 F (37.1 C)  SpO2:  95%     Intake/Output from previous day:  01/18 0701 - 01/19 0700 In: 1967.9  [P.O.:20; I.V.:1947.9] Out: 225 [Drains:225]  Intake/Output this shift:  No intake/output data recorded.   Physical Exam:   General: Thin older WM, delirious.     HEENT: Normal. Pupils equal. .   Lungs: Clear.   Abdomen: Soft.  Has a few BS.   Wound: Clean Drain in mid abdomen - frankly bilious output   Lab Results:    Recent Labs    01/09/19 0846 01/10/19 0530  WBC 10.0 9.3  HGB 11.0* 10.2*  HCT 34.1* 31.5*  PLT 145* 147*    BMET   Recent Labs    01/09/19 0846 01/10/19 0530  NA 149* 145  K 4.6 4.5  CL 123* 121*  CO2 16* 15*  GLUCOSE 116* 115*  BUN 52* 48*  CREATININE 2.02* 1.84*  CALCIUM 8.8* 8.5*    PT/INR  No results for input(s): LABPROT, INR in the last 72 hours.  ABG  No results for input(s): PHART, HCO3 in the last 72 hours.  Invalid input(s): PCO2, PO2   Studies/Results:  Dg Abd Portable 1v  Result Date: 01/08/2019 CLINICAL DATA:  Status post biliary stent placement. Question displacement. EXAM: PORTABLE ABDOMEN - 1 VIEW COMPARISON:  CT abdomen and pelvis 01/01/2019. Images from ERCP 01/06/2019 reviewed. FINDINGS: Biliary stent is in place. Although its course is atypical with the distal end of the  stent crossing the midline, it is likely in good position based on correlation with prior CT. Bowel gas pattern is unremarkable. IMPRESSION: Exact position of the biliary tract exact position of the patient's biliary stent can not be determined on this examination but it is likely in good position based on correlation with prior CT. Electronically Signed   By: Inge Rise M.D.   On: 01/08/2019 15:35     Anti-infectives:   Anti-infectives (From admission, onward)   Start     Dose/Rate Route Frequency Ordered Stop   01/02/19 1400  piperacillin-tazobactam (ZOSYN) IVPB 2.25 g  Status:  Discontinued     2.25 g 100 mL/hr over 30 Minutes Intravenous Every 8 hours 01/02/19 1223 01/02/19 1224   01/02/19 1400  piperacillin-tazobactam (ZOSYN) IVPB 2.25 g      2.25 g 100 mL/hr over 30 Minutes Intravenous Every 8 hours 01/02/19 1224 01/07/19 0650   01/02/19 0909  ceFAZolin (ANCEF) 2-4 GM/100ML-% IVPB  Status:  Discontinued    Note to Pharmacy:  Guerry Bruin   : cabinet override      01/02/19 0909 01/02/19 0916   01/02/19 0600  ceFAZolin (ANCEF) IVPB 2g/100 mL premix  Status:  Discontinued     2 g 200 mL/hr over 30 Minutes Intravenous On call to O.R. 01/01/19 1909 01/02/19 1218   01/02/19 0600  metroNIDAZOLE (FLAGYL) IVPB 500 mg     500 mg 100 mL/hr over 60 Minutes Intravenous On call to O.R. 01/01/19 1909 01/02/19 0836   01/01/19 1730  cefTRIAXone (ROCEPHIN) 2 g in sodium chloride 0.9 % 100 mL IVPB  Status:  Discontinued     2 g 200 mL/hr over 30 Minutes Intravenous  Once 01/01/19 1728 01/02/19 [place] MD, FACS 01/10/2019

## 2019-01-10 NOTE — Progress Notes (Addendum)
On call provider notified of platelet count of 147, RN held Heparin 5,000u/mL.   Orders received: okay to give heparin.

## 2019-01-10 NOTE — Progress Notes (Signed)
   01/10/19 2006  Vitals  BP (!) 184/83   5mg  Hydralazine IV given @ 2018 per order.

## 2019-01-11 ENCOUNTER — Encounter (HOSPITAL_COMMUNITY): Payer: Self-pay | Admitting: Gastroenterology

## 2019-01-11 DIAGNOSIS — L89626 Pressure-induced deep tissue damage of left heel: Secondary | ICD-10-CM | POA: Diagnosis not present

## 2019-01-11 DIAGNOSIS — R634 Abnormal weight loss: Secondary | ICD-10-CM | POA: Diagnosis not present

## 2019-01-11 DIAGNOSIS — I951 Orthostatic hypotension: Secondary | ICD-10-CM | POA: Diagnosis not present

## 2019-01-11 DIAGNOSIS — L89620 Pressure ulcer of left heel, unstageable: Secondary | ICD-10-CM | POA: Diagnosis not present

## 2019-01-11 DIAGNOSIS — R932 Abnormal findings on diagnostic imaging of liver and biliary tract: Secondary | ICD-10-CM | POA: Diagnosis not present

## 2019-01-11 DIAGNOSIS — I509 Heart failure, unspecified: Secondary | ICD-10-CM | POA: Diagnosis not present

## 2019-01-11 DIAGNOSIS — E46 Unspecified protein-calorie malnutrition: Secondary | ICD-10-CM | POA: Diagnosis not present

## 2019-01-11 DIAGNOSIS — E785 Hyperlipidemia, unspecified: Secondary | ICD-10-CM | POA: Diagnosis not present

## 2019-01-11 DIAGNOSIS — K8000 Calculus of gallbladder with acute cholecystitis without obstruction: Secondary | ICD-10-CM | POA: Diagnosis not present

## 2019-01-11 DIAGNOSIS — E43 Unspecified severe protein-calorie malnutrition: Secondary | ICD-10-CM | POA: Diagnosis not present

## 2019-01-11 DIAGNOSIS — Z7401 Bed confinement status: Secondary | ICD-10-CM | POA: Diagnosis not present

## 2019-01-11 DIAGNOSIS — R2689 Other abnormalities of gait and mobility: Secondary | ICD-10-CM | POA: Diagnosis not present

## 2019-01-11 DIAGNOSIS — R41841 Cognitive communication deficit: Secondary | ICD-10-CM | POA: Diagnosis not present

## 2019-01-11 DIAGNOSIS — K219 Gastro-esophageal reflux disease without esophagitis: Secondary | ICD-10-CM | POA: Diagnosis not present

## 2019-01-11 DIAGNOSIS — R278 Other lack of coordination: Secondary | ICD-10-CM | POA: Diagnosis not present

## 2019-01-11 DIAGNOSIS — K838 Other specified diseases of biliary tract: Secondary | ICD-10-CM | POA: Diagnosis not present

## 2019-01-11 DIAGNOSIS — I2119 ST elevation (STEMI) myocardial infarction involving other coronary artery of inferior wall: Secondary | ICD-10-CM | POA: Diagnosis not present

## 2019-01-11 DIAGNOSIS — R040 Epistaxis: Secondary | ICD-10-CM | POA: Diagnosis not present

## 2019-01-11 DIAGNOSIS — I6522 Occlusion and stenosis of left carotid artery: Secondary | ICD-10-CM | POA: Diagnosis not present

## 2019-01-11 DIAGNOSIS — M2550 Pain in unspecified joint: Secondary | ICD-10-CM | POA: Diagnosis not present

## 2019-01-11 DIAGNOSIS — N184 Chronic kidney disease, stage 4 (severe): Secondary | ICD-10-CM | POA: Diagnosis not present

## 2019-01-11 DIAGNOSIS — I340 Nonrheumatic mitral (valve) insufficiency: Secondary | ICD-10-CM | POA: Diagnosis not present

## 2019-01-11 DIAGNOSIS — R531 Weakness: Secondary | ICD-10-CM | POA: Diagnosis not present

## 2019-01-11 DIAGNOSIS — I739 Peripheral vascular disease, unspecified: Secondary | ICD-10-CM | POA: Diagnosis not present

## 2019-01-11 DIAGNOSIS — J449 Chronic obstructive pulmonary disease, unspecified: Secondary | ICD-10-CM | POA: Diagnosis not present

## 2019-01-11 DIAGNOSIS — I350 Nonrheumatic aortic (valve) stenosis: Secondary | ICD-10-CM | POA: Diagnosis not present

## 2019-01-11 DIAGNOSIS — N183 Chronic kidney disease, stage 3 (moderate): Secondary | ICD-10-CM | POA: Diagnosis not present

## 2019-01-11 DIAGNOSIS — I5042 Chronic combined systolic (congestive) and diastolic (congestive) heart failure: Secondary | ICD-10-CM | POA: Diagnosis not present

## 2019-01-11 DIAGNOSIS — I10 Essential (primary) hypertension: Secondary | ICD-10-CM | POA: Diagnosis not present

## 2019-01-11 DIAGNOSIS — K9189 Other postprocedural complications and disorders of digestive system: Secondary | ICD-10-CM | POA: Diagnosis not present

## 2019-01-11 DIAGNOSIS — I2510 Atherosclerotic heart disease of native coronary artery without angina pectoris: Secondary | ICD-10-CM | POA: Diagnosis not present

## 2019-01-11 DIAGNOSIS — Z9861 Coronary angioplasty status: Secondary | ICD-10-CM | POA: Diagnosis not present

## 2019-01-11 LAB — HEPATIC FUNCTION PANEL
ALT: 14 U/L (ref 0–44)
AST: 23 U/L (ref 15–41)
Albumin: 2.4 g/dL — ABNORMAL LOW (ref 3.5–5.0)
Alkaline Phosphatase: 78 U/L (ref 38–126)
BILIRUBIN DIRECT: 0.2 mg/dL (ref 0.0–0.2)
Indirect Bilirubin: 0.7 mg/dL (ref 0.3–0.9)
Total Bilirubin: 0.9 mg/dL (ref 0.3–1.2)
Total Protein: 5.2 g/dL — ABNORMAL LOW (ref 6.5–8.1)

## 2019-01-11 MED ORDER — ADULT MULTIVITAMIN W/MINERALS CH: 1.0000 | ORAL_TABLET | Freq: Every day | ORAL | Status: DC

## 2019-01-11 MED ORDER — LABETALOL HCL 5 MG/ML IV SOLN
10.0000 mg | INTRAVENOUS | Status: DC | PRN
  Filled 2019-01-11: qty 4

## 2019-01-11 MED ORDER — ISOSORBIDE MONONITRATE ER 60 MG PO TB24: 60.0000 mg | ORAL_TABLET | Freq: Every day | ORAL | 0 refills | Status: DC

## 2019-01-11 MED ORDER — HYDRALAZINE HCL 20 MG/ML IJ SOLN: 5.0000 mg | INTRAMUSCULAR | Status: DC | PRN

## 2019-01-11 MED ORDER — HYDRALAZINE HCL 50 MG PO TABS: 50.0000 mg | ORAL_TABLET | Freq: Three times a day (TID) | ORAL | 0 refills | Status: DC

## 2019-01-11 MED ORDER — METOPROLOL TARTRATE 25 MG PO TABS: 25.0000 mg | ORAL_TABLET | Freq: Two times a day (BID) | ORAL | 0 refills | Status: DC

## 2019-01-11 NOTE — Clinical Social Work Placement (Signed)
   CLINICAL SOCIAL WORK PLACEMENT  NOTE  Date:  01/11/2019  Patient Details  Name: Nicholas Porter MRN: [id number] Date of Birth: 25-Aug-1930  Clinical Social Work is seeking post-discharge placement for this patient at the [place] level of care (*CSW will initial, date and re-position this form in  chart as items are completed):  Yes   Patient/family provided with [place] Work Department's list of facilities offering this level of care within the geographic area requested by the patient (or if unable, by the patient's family).  Yes   Patient/family informed of their freedom to choose among providers that offer the needed level of care, that participate in Medicare, Medicaid or managed care program needed by the patient, have an available bed and are willing to accept the patient.  Yes   Patient/family informed of [facility]'s ownership interest in [facility] and [facility], as well as of the fact that they are under no obligation to receive care at these facilities.  PASRR submitted to EDS on       PASRR number received on       Existing PASRR number confirmed on 01/11/19     FL2 transmitted to all facilities in geographic area requested by pt/family on 01/11/19     FL2 transmitted to all facilities within larger geographic area on       Patient informed that his/her managed care company has contracts with or will negotiate with certain facilities, including the following:            Patient/family informed of bed offers received.  Patient chooses bed at [facility]     Physician recommends and patient chooses bed at      Patient to be transferred to   on 01/11/19.  Patient to be transferred to facility by [facility]     Patient family notified on 01/11/19 of transfer.  Name of family member notified:  spouse and daughter aware of dc     PHYSICIAN Please sign DNR     Additional Comment:     _______________________________________________ Wende Neighbors, LCSW 01/11/2019, 1:27 PM

## 2019-01-11 NOTE — Progress Notes (Addendum)
Clinical Social Worker facilitated patient discharge including contacting patient family and facility to confirm patient discharge plans.  Clinical information faxed to facility and family agreeable with plan.  CSW arranged ambulance transport via [facility] to [place].  RN to call [phone] ([phone]) for report prior to discharge.  Clinical Social Worker will sign off for now as social work intervention is no longer needed. Please consult [place] again if new need arises.  Rhea Pink, MSW, [place]

## 2019-01-11 NOTE — Plan of Care (Signed)
  Problem: Clinical Measurements: Goal: Will remain free from infection Outcome: Progressing Goal: Cardiovascular complication will be avoided Outcome: Progressing   Problem: Coping: Goal: Level of anxiety will decrease Outcome: Progressing   Problem: Pain Managment: Goal: General experience of comfort will improve Outcome: Progressing

## 2019-01-11 NOTE — Discharge Summary (Signed)
Physician Discharge Summary  Nicholas Porter GMW:[id number] DOB: 1930/08/17 DOA: 01/01/2019  PCP: Dorothyann Peng, NP  Admit date: 01/01/2019 Discharge date: 01/11/2019  Admitted From: Home Disposition: nursing home Recommendations for Outpatient Follow-up:  1. Follow up with PCP in 1-2 weeks 2. Please obtain BMP/CBC in one week 3. Follow-up with Dr. Watt Climes in 1 to 2 weeks  [place] none Equipment/Devices none   Discharge Condition: Stable and improved CODE STATUS: DO NOT RESUSCITATE Diet recommendation: Cardiac diet Brief/Interim Summary:85 y.o.malehas medical history relevant for CAD with prior angioplasty and stenting, PAD with prior left carotid endodiathermy, COPD/patient is a reformed smoker who quit smoking about 50 years ago as well ash/o partial gastrectomy/vagatomy in distant past for ulcer. LAst EGD 2009 with mild gastritis ,CKDIII, unintentional weight loss 60 lbs last year admitted on 01/01/2019 with abdominal pain and found to have calculus cholecystitis, status post lap chole and lysis of adhesions on 01/02/2019 by Dr. Johney Maine, patient subsequently developed AKI,JP drain with high output, HIDA scan on 01/05/2019 with active bile leak to the OR on 01/06/2019  Discharge Diagnoses:  Principal Problem:   Delayed cystic duct stump bile leak s/p ERCP/stent 01/06/2019 Active Problems:   Essential hypertension   CAD S/P percutaneous coronary angioplasty - DES in RCA with PTCA for ISR; Moderate mLAD, 80% ostial OM2 stable   H/O Inferior STEMI (09/2013) emergent PCI with Promus DES to RCA (3.0 mm  x 28 mm - 3.2 mm)   GERD (gastroesophageal reflux disease)   Malnutrition of moderate degree   Benign prostatic hyperplasia   Acute gangrenous cholecystitis s/p lap cholecystectomy 01/02/2019   Unintentional weight loss   History of medication noncompliance   History of partial gastrectomy   Pill dysphagia   CKD (chronic kidney disease), stage III (HCC)   Protein-calorie malnutrition,  severe  1)Acute calculus cholecystitis-----complicated by active bile leak,JP drain is decreasing every day.  Down to 190 today from 225 yesterday.HIDA scan on 01/05/2019 with active bile leak to the OR on 01/06/2019,status post 5 days of IV antibiotics Rocephin and Flagyl.  Discussed with Dr. Penelope Coop from GI will not upsize the tube at this time will have him follow-up with Dr. Watt Climes in 1 to 2 weeks  S/P ERCP sphinctererotomy and stent in CBD.  2)HTN--continue. Imdur 30 mg daily along with hydralazine 50 mg 3 times daily andmetoprolol 25 mg twice daily,  4)HFpEF---patient with history of combined systolic and diastolic dysfunction CHF, repeat echo with preserved EF of 55 to 60% with grade 1 dCHF in the setting of known CAD and possible ischemic cardiomyopathy-  5)H/o CAD and PAD--CAD with prior angioplasty and stenting, PAD with prior left carotid endarterecmy ,patient apparently is intolerant to Lipitor trial low-dose pravastatin, continue aspirin -continue metoprolol, no ACS type symptoms at this time, EKG without ischemic changes, repeat echo with preserved EF of 55 to 60% with grade 1 dCHF  6)AKI----acute kidney injury on CKD stage -improved and back to baseline.  7)hypernatremia-resolved.    8)mild hyperkalemiaresolved with IV hydration    9]thrombocytopenia resolved     Nutrition Problem: Severe Malnutrition Etiology: chronic illness(COPD, CKD, CHF)    Signs/Symptoms: severe fat depletion, severe muscle depletion, energy intake < or equal to 75% for > or equal to 1 month     Interventions: Boost Breeze, Ensure Enlive (each supplement provides 350kcal and 20 grams of protein), Magic cup  Estimated body mass index is 14.99 kg/m as calculated from the following:   Height as of this encounter: 5\' 7"  (  1.702 m).   Weight as of this encounter: 43.4 kg.  Discharge Instructions   Allergies as of 01/11/2019      Reactions   Amlodipine Besylate Hives    Hydralazine Itching   Lipitor [atorvastatin] Itching   Naproxen Diarrhea   Oxycodone-acetaminophen Itching   Isosorbide Rash      Medication List    STOP taking these medications   aspirin EC 81 MG tablet   metoprolol succinate 25 MG 24 hr tablet Commonly known as:  TOPROL-XL   torsemide 20 MG tablet Commonly known as:  DEMADEX     TAKE these medications   acetaminophen 325 MG tablet Commonly known as:  TYLENOL Take 650 mg by mouth every 6 (six) hours as needed for mild pain or headache.   brinzolamide 1 % ophthalmic suspension Commonly known as:  AZOPT Place 1 drop into the left eye every 12 (twelve) hours.   CALCIUM PO Take 1 tablet by mouth every other day.   hydrALAZINE 50 MG tablet Commonly known as:  APRESOLINE Take 1 tablet (50 mg total) by mouth 3 (three) times daily.   isosorbide mononitrate 60 MG 24 hr tablet Commonly known as:  IMDUR Take 1 tablet (60 mg total) by mouth daily. Start taking on:  January 12, 2019   metoprolol tartrate 25 MG tablet Commonly known as:  LOPRESSOR Take 1 tablet (25 mg total) by mouth 2 (two) times daily.   multivitamin with minerals Tabs tablet Take 1 tablet by mouth daily. Start taking on:  January 12, 2019   nitroGLYCERIN 0.4 MG SL tablet Commonly known as:  NITROSTAT DISSOLVE 1 TABLET UNDER THE TONGUE EVERY 5 MINUTES AS NEEDED FOR CHEST PAIN What changed:    how much to take  how to take this  when to take this  reasons to take this   TRAVATAN Z 0.004 % Soln ophthalmic solution Generic drug:  Travoprost (BAK Free) Place 1 drop into both eyes at bedtime.      Follow-up Information    [facility] [place] Follow up on 01/25/2019.   Specialty:  General Surgery Why:  your appointment is at 8:45 AM,  Be at the office 30 minutes early for check in.  Bring photo ID and insurance information.  If you are coming from the Nursing facility, family or staff need to be with you for the appointment.   Contact  information: [address] [place] [address] [phone]        Michael Boston, MD Follow up on 02/01/2019.   Specialty:  General Surgery Why:  Your appointment is at 1:45 PM, be at the office 30 minutes early for check in.  Be at the office 30 minutes early for check in.  If you are coming from the Nursing facility, family or staff need to be with you for the appointment.   Contact information: [address] [place] [address] [phone]        Clarene Essex, MD Follow up.   Specialty:  Gastroenterology Contact information: [address] [place] [address] [phone]        Dorothyann Peng, NP Follow up.   Specialty:  Family Medicine Contact information: [place] [address] [phone]        Leonie Man, MD .   Specialty:  Cardiology Contact information: [address] [place] [address] [phone]          Allergies  Allergen Reactions  . Amlodipine Besylate Hives  . Hydralazine Itching  . Lipitor [Atorvastatin] Itching  . Naproxen Diarrhea  . Oxycodone-Acetaminophen Itching  . Isosorbide Rash    Consultations: GI and general surgery  Procedures/Studies: Ct Abdomen Pelvis Wo Contrast  Result Date: 01/01/2019 CLINICAL DATA:  85 year old male with history of left lower quadrant abdominal pain radiating to the back. EXAM: CT ABDOMEN AND PELVIS WITHOUT CONTRAST TECHNIQUE: Multidetector CT imaging of the abdomen and pelvis was performed following the standard protocol without IV contrast. COMPARISON:  CT the abdomen and pelvis 05/07/2018. FINDINGS: Lower chest: Aortic atherosclerosis. Calcified atherosclerotic plaque in the right coronary artery. Calcifications of the mitral annulus. Surgical clips adjacent to the gastroesophageal junction. Hepatobiliary: No definite cystic or solid hepatic lesions are confidently identified on today's noncontrast CT  examination. Small calcified gallstones in the gallbladder. No findings to suggest an acute cholecystitis at this time. Pancreas: No definite pancreatic mass or peripancreatic fluid or inflammatory changes are noted on today's noncontrast CT examination. Spleen: Unremarkable. Adrenals/Urinary Tract: There are no abnormal calcifications within the collecting system of either kidney, along the course of either ureter, or within the lumen of the urinary bladder. No hydroureteronephrosis or perinephric stranding to suggest urinary tract obstruction at this time. The unenhanced appearance of the kidneys is unremarkable bilaterally. Unenhanced appearance of the urinary bladder is normal. Bilateral adrenal glands are normal in appearance. Stomach/Bowel: Unenhanced appearance of the stomach is normal. No pathologic dilatation of small bowel or colon. Numerous colonic diverticulae are noted, without surrounding inflammatory changes to suggest an acute diverticulitis at this time. The appendix is not confidently identified and may be surgically absent. Regardless, there are no inflammatory changes noted adjacent to the cecum to suggest the presence of an acute appendicitis at this time. Vascular/Lymphatic: Aortic atherosclerosis. No lymphadenopathy confidently identified in the abdomen or pelvis on today's noncontrast CT examination. Reproductive: Prostate gland and seminal vesicles are unremarkable in appearance. Other: No significant volume of ascites.  No pneumoperitoneum. Musculoskeletal: There are no aggressive appearing lytic or blastic lesions noted in the visualized portions of the skeleton. IMPRESSION: 1. No acute findings are noted in the abdomen or pelvis to account for the patient's symptoms. 2. Severe colonic diverticulosis without definite surrounding inflammatory changes to indicate an acute diverticulitis at this time. 3. Cholelithiasis without evidence of acute cholecystitis. 4. Aortic atherosclerosis, in  addition to least right coronary artery disease. 5. There are calcifications of the mitral annulus. Echocardiographic correlation for evaluation of potential valvular dysfunction may be warranted if clinically indicated. Electronically Signed   By: Vinnie Langton M.D.   On: 01/01/2019 14:36   Dg Chest 2 View  Result Date: 01/01/2019 CLINICAL DATA:  Medical clearance for cholecystitis EXAM: CHEST - 2 VIEW COMPARISON:  03/27/2018 FINDINGS: Emphysematous hyperinflation of the lungs without acute pulmonary consolidation. Normal heart size and mediastinal contours with aortic atherosclerosis. Vascular clip projects over the base of the neck on the left. 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 IMPRESSION: Emphysematous hyperinflation of the lungs without acute pulmonary disease. Electronically Signed   By: Ashley Royalty M.D.   On: 01/01/2019 18:03   Dg Chest Port 1 View  Result Date: 01/02/2019 CLINICAL DATA:  85 y/o  M; unresponsiveness. EXAM: PORTABLE CHEST 1 VIEW COMPARISON:  01/01/2019 chest radiograph FINDINGS: Normal cardiac silhouette. Aortic atherosclerosis with calcification. Clear lungs. No pleural effusion or pneumothorax. No acute osseous abnormality is evident. Surgical clips project  over the gastroesophageal junction. IMPRESSION: No acute pulmonary process identified. Electronically Signed   By: Kristine Garbe M.D.   On: 01/02/2019 06:11   Dg Ercp  Result Date: 01/06/2019 CLINICAL DATA:  Bile duct leak. EXAM: ERCP TECHNIQUE: Multiple spot images obtained with the fluoroscopic device and submitted for interpretation post-procedure. COMPARISON:  right upper quadrant abdominal ultrasound - 01/01/2019; CT abdomen and pelvis - 01/01/2019; nuclear medicine HIDA scan - 01/05/2019 FINDINGS: Four spot intraoperative fluoroscopic images of the right upper abdominal quadrant during ERCP are provided for review Initial  image demonstrates an ERCP probe overlying the right upper abdominal quadrant. There is selective cannulation and opacification of the common bile duct. Multiple surgical clips overlie the expected location of the gallbladder fossa. There is a large bore surgical drainage catheter which also overlies expected location of the gallbladder fossa and caudal aspect of the right lobe of the liver. Subsequent images demonstrate further opacification of the common bile duct which appears moderately dilated. Ultimately there is opacification of the cystic duct with extravasation of contrast adjacent to the cholecystectomy clips and surgical drainage catheter. Completion image demonstrates placement of a internal biliary stent traversing the confluence of the cystic duct. IMPRESSION: ERCP with findings suggestive of residual cystic duct bile leak with subsequent placement of an internal biliary stent. These images were submitted for radiologic interpretation only. Please see the procedural report for the amount of contrast and the fluoroscopy time utilized. Electronically Signed   By: Sandi Mariscal M.D.   On: 01/06/2019 10:02   Dg Abd Portable 1v  Result Date: 01/08/2019 CLINICAL DATA:  Status post biliary stent placement. Question displacement. EXAM: PORTABLE ABDOMEN - 1 VIEW COMPARISON:  CT abdomen and pelvis 01/01/2019. Images from ERCP 01/06/2019 reviewed. FINDINGS: Biliary stent is in place. Although its course is atypical with the distal end of the stent crossing the midline, it is likely in good position based on correlation with prior CT. Bowel gas pattern is unremarkable. IMPRESSION: Exact position of the biliary tract exact position of the patient's biliary stent can not be determined on this examination but it is likely in good position based on correlation with prior CT. Electronically Signed   By: Inge Rise M.D.   On: 01/08/2019 15:35   Nm Hepatobiliary Including Gb  Result Date: 01/05/2019 CLINICAL  DATA:  Laparoscopic cholecystectomy. Cholecystectomy 01/02/2019. Bilious drain output. EXAM: NUCLEAR MEDICINE HEPATOBILIARY IMAGING TECHNIQUE: Sequential images of the abdomen were obtained out to 60 minutes following intravenous administration of radiopharmaceutical. RADIOPHARMACEUTICALS:  5.0 mCi Tc-38m  Choletec IV COMPARISON:  CT and ultrasound 01/01/2019 FINDINGS: Prompt clearance of radiotracer from the blood pool and homogeneous uptake in the liver. Excreted radiotracer quickly fills the common hepatic duct and extends into the gallbladder fossa. Radiotracer extends along the RIGHT hepatic margin and into RIGHT lower quadrant peritoneal space. Findings consistent with active bile leak. No counts evident within the bowel. IMPRESSION: 1. Active bile leak with excreted radiotracer filling the gallbladder fossa and extending into the RIGHT lower quadrant peritoneal space. 2. No significant counts transit to the small bowel. These results will be called to the ordering clinician or representative by the Radiologist Assistant, and communication documented in the PACS or zVision Dashboard. Electronically Signed   By: Suzy Bouchard M.D.   On: 01/05/2019 12:13   US Abdomen Limited Ruq  Result Date: 01/01/2019 CLINICAL DATA:  Upper abdominal pain EXAM: ULTRASOUND ABDOMEN LIMITED RIGHT UPPER QUADRANT COMPARISON:  CT from earlier in the same day. FINDINGS: Gallbladder:  Gallbladder is well distended with wall thickening to 5.7 mm and edema within the gallbladder wall. Gallstones are noted. Positive sonographic Murphy's sign is noted. Common bile duct: Diameter: 10 mm Liver: Liver is within normal limits with the exception of a 1 cm hyperechoic focus within the left lobe consistent with a small hemangioma. Portal vein is patent on color Doppler imaging with normal direction of blood flow towards the liver. IMPRESSION: Changes consistent with acute calculous cholecystitis in the appropriate clinical setting.  Hyperechoic focus within the left lobe of the liver most consistent with small hemangioma. Electronically Signed   By: Inez Catalina M.D.   On: 01/01/2019 16:34   (Echo, Carotid, EGD, Colonoscopy, ERCP)    Subjective:   Discharge Exam: Vitals:   01/11/19 0443 01/11/19 0800  BP: (!) 153/63 (!) 180/64  Pulse: 65 72  Resp: 18   Temp: 99.1 F (37.3 C)   SpO2: 96%    Vitals:   01/10/19 2006 01/10/19 2217 01/11/19 0443 01/11/19 0800  BP: (!) 184/83 (!) 151/54 (!) 153/63 (!) 180/64  Pulse: 79 71 65 72  Resp: 18  18   Temp: 98.5 F (36.9 C)  99.1 F (37.3 C)   TempSrc: Oral  Oral   SpO2: 93%  96%   Weight:      Height:        General: Pt is alert, awake, not in acute distress Cardiovascular: RRR, S1/S2 +, no rubs, no gallops Respiratory: CTA bilaterally, no wheezing, no rhonchi Abdominal: Soft, NT, ND, bowel sounds + drain in place Extremities: no edema, no cyanosis    The results of significant diagnostics from this hospitalization (including imaging, microbiology, ancillary and laboratory) are listed below for reference.     Microbiology: Recent Results (from the past 240 hour(s))  Blood culture (routine x 2)     Status: None   Collection Time: 01/01/19  7:58 PM  Result Value Ref Range Status   Specimen Description   Final    BLOOD LEFT ANTECUBITAL Performed at [facility] [address], [place], [place] [address]    Special Requests   Final    BOTTLES DRAWN AEROBIC AND ANAEROBIC Blood Culture adequate volume Performed at [facility] [address], [place], [place] [address]    Culture   Final    NO GROWTH 5 DAYS Performed at [facility], [place] [address], [place], [place] [address]    Report Status 01/06/2019 FINAL  Final  Blood culture (routine x 2)     Status: None   Collection Time: 01/01/19  7:58 PM  Result Value Ref Range Status   Specimen Description BLOOD LEFT FOREARM  Final   Special Requests   Final     BOTTLES DRAWN AEROBIC AND ANAEROBIC Blood Culture adequate volume Performed at [facility] [address], [place], [place] [address]    Culture   Final    NO GROWTH 5 DAYS Performed at [facility], [place] [address], [place], [place] [address]    Report Status 01/06/2019 FINAL  Final  Surgical pcr screen     Status: None   Collection Time: 01/02/19  5:57 AM  Result Value Ref Range Status   MRSA, PCR NEGATIVE NEGATIVE Final   Staphylococcus aureus NEGATIVE NEGATIVE Final    Comment: (NOTE) The Xpert SA Assay (FDA approved for NASAL specimens in patients 40 years of age and older), is one component of a comprehensive surveillance program.  It is not intended to diagnose infection nor to guide or monitor treatment. Performed at [facility], [place] [address] [place], [place] [address]      Labs: BNP (last 3 results) No results for input(s): BNP in the last 8760 hours. Basic Metabolic Panel: Recent Labs  Lab 01/05/19 0930 01/06/19 0531 01/07/19 1343 01/08/19 0816 01/09/19 0846 01/10/19 0530  NA 146*  --  147* 151* 149* 145  K 5.0 4.9 5.4* 5.2* 4.6 4.5  CL 118*  --  125* 126* 123* 121*  CO2 18*  --  13* 15* 16* 15*  GLUCOSE 119*  --  108* 103* 116* 115*  BUN 73*  --  64* 58* 52* 48*  CREATININE 2.65* 2.61* 2.55* 2.39* 2.02* 1.84*  CALCIUM 8.5*  --  8.5* 8.7* 8.8* 8.5*   Liver Function Tests: Recent Labs  Lab 01/05/19 0735 01/05/19 0930 01/06/19 0531 01/07/19 1343 01/10/19 0530  AST 19 18 22 23 23   ALT 15 14 13 14 14   ALKPHOS 49 49 59 97 78  BILITOT 0.9 0.9 1.1 1.2 0.9  PROT 5.5* 5.7* 5.5* 5.4* 5.2*  ALBUMIN 2.7* 2.8* 2.5* 2.7* 2.4*   Recent Labs  Lab 01/05/19 0930  LIPASE 76*   No results for input(s): AMMONIA in the last 168 hours. CBC: Recent Labs  Lab 01/05/19 0735 01/07/19 1343 01/08/19 0816 01/09/19 0846 01/10/19 0530  WBC 6.9 9.5 10.0 10.0 9.3  HGB 10.1* 10.4* 11.2* 11.0* 10.2*  HCT 31.9* 33.4*  34.4* 34.1* 31.5*  MCV 106.0* 105.7* 101.2* 103.0* 102.9*  PLT 71* 109* 130* 145* 147*   Cardiac Enzymes: No results for input(s): CKTOTAL, CKMB, CKMBINDEX, TROPONINI in the last 168 hours. BNP: Invalid input(s): POCBNP CBG: No results for input(s): GLUCAP in the last 168 hours. D-Dimer No results for input(s): DDIMER in the last 72 hours. Hgb A1c No results for input(s): HGBA1C in the last 72 hours. Lipid Profile No results for input(s): CHOL, HDL, LDLCALC, TRIG, CHOLHDL, LDLDIRECT in the last 72 hours. Thyroid function studies No results for input(s): TSH, T4TOTAL, T3FREE, THYROIDAB in the last 72 hours.  Invalid input(s): FREET3 Anemia work up No results for input(s): VITAMINB12, FOLATE, FERRITIN, TIBC, IRON, RETICCTPCT in the last 72 hours. Urinalysis    Component Value Date/Time   COLORURINE YELLOW 01/02/2019 [place] 01/02/2019 0555   LABSPEC 1.008 01/02/2019 0555   PHURINE 5.0 01/02/2019 0555   GLUCOSEU NEGATIVE 01/02/2019 0555   GLUCOSEU NEGATIVE 04/15/2017 1718   HGBUR SMALL (A) 01/02/2019 0555   HGBUR negative 12/06/2009 0836   BILIRUBINUR NEGATIVE 01/02/2019 0555   BILIRUBINUR negative 02/13/2017 1025   KETONESUR NEGATIVE 01/02/2019 0555   PROTEINUR 30 (A) 01/02/2019 0555   UROBILINOGEN 0.2 04/15/2017 1718   NITRITE NEGATIVE 01/02/2019 0555   LEUKOCYTESUR NEGATIVE 01/02/2019 0555   Sepsis Labs Invalid input(s): PROCALCITONIN,  WBC,  LACTICIDVEN Microbiology Recent Results (from the past 240 hour(s))  Blood culture (routine x 2)     Status: None   Collection Time: 01/01/19  7:58 PM  Result Value Ref Range Status   Specimen Description   Final    BLOOD LEFT ANTECUBITAL Performed at [facility], [place] [address] [place], [place] [address]    Special Requests   Final    BOTTLES DRAWN AEROBIC AND ANAEROBIC Blood Culture adequate volume Performed at [facility] [address] [place], [place]  [address]    Culture   Final  NO GROWTH 5 DAYS Performed at [facility], [place] [address], [place], [place] [address]    Report Status 01/06/2019 FINAL  Final  Blood culture (routine x 2)     Status: None   Collection Time: 01/01/19  7:58 PM  Result Value Ref Range Status   Specimen Description BLOOD LEFT FOREARM  Final   Special Requests   Final    BOTTLES DRAWN AEROBIC AND ANAEROBIC Blood Culture adequate volume Performed at [facility] [address], [place], [place] [address]    Culture   Final    NO GROWTH 5 DAYS Performed at [facility], [place] [address], [place], [place] [address]    Report Status 01/06/2019 FINAL  Final  Surgical pcr screen     Status: None   Collection Time: 01/02/19  5:57 AM  Result Value Ref Range Status   MRSA, PCR NEGATIVE NEGATIVE Final   Staphylococcus aureus NEGATIVE NEGATIVE Final    Comment: (NOTE) The Xpert SA Assay (FDA approved for NASAL specimens in patients 21 years of age and older), is one component of a comprehensive surveillance program. It is not intended to diagnose infection nor to guide or monitor treatment. Performed at [facility], [place] [address], [place],  [address]      Time coordinating discharge: 33 minutes  SIGNED:   Georgette Shell, MD  [facility] 01/11/2019, 12:12 PM Pager   If 7PM-7AM, please contact night-coverage [email or web address] Password [email or web address]

## 2019-01-11 NOTE — Progress Notes (Signed)
5 Days Post-Op    CC: cholecystitis  Subjective: Mentation is much better today.  He knows year, and he had an operation, took awhile to remember [facility] as where he was.  Eating fairly good breakfast this AM. Port sites and drain are fine.  Drain still full of bile.  Nutriton following also.  Being referred to SNF by PT/OT.   Objective: Vital signs in last 24 hours: Temp:  [98.3 F (36.8 C)-99.1 F (37.3 C)] 99.1 F (37.3 C) (01/20 0443) Pulse Rate:  [65-79] 72 (01/20 0800) Resp:  [18-24] 18 (01/20 0443) BP: (151-185)/(54-88) 180/64 (01/20 0800) SpO2:  [93 %-96 %] 96 % (01/20 0443) Last BM Date: 01/10/19 120 PO recorded 540 IV 350 urine Drain down to 190 ml  Stool x 3 Afebrile, VSS Labs stable - will check LFT's also No films Intake/Output from previous day: 01/19 0701 - 01/20 0700 In: 665.8 [P.O.:120; I.V.:540.8] Out: 540 [Urine:350; Drains:190] Intake/Output this shift: No intake/output data recorded.  General appearance: alert, cooperative, no distress and Mentation is much clearer this AM.   Resp: clear to auscultation bilaterally GI: soft, nontender, tolerating Pancakes this AM, port sites look fine.  Drain 190 recorded bililous fluid  Lab Results:  Recent Labs    01/09/19 0846 01/10/19 0530  WBC 10.0 9.3  HGB 11.0* 10.2*  HCT 34.1* 31.5*  PLT 145* 147*    BMET Recent Labs    01/09/19 0846 01/10/19 0530  NA 149* 145  K 4.6 4.5  CL 123* 121*  CO2 16* 15*  GLUCOSE 116* 115*  BUN 52* 48*  CREATININE 2.02* 1.84*  CALCIUM 8.8* 8.5*   PT/INR No results for input(s): LABPROT, INR in the last 72 hours.  Recent Labs  Lab 01/05/19 0735 01/05/19 0930 01/06/19 0531 01/07/19 1343  AST 19 18 22 23   ALT 15 14 13 14   ALKPHOS 49 49 59 97  BILITOT 0.9 0.9 1.1 1.2  PROT 5.5* 5.7* 5.5* 5.4*  ALBUMIN 2.7* 2.8* 2.5* 2.7*     Lipase     Component Value Date/Time   LIPASE 76 (H) 01/05/2019 0930     Medications: . aspirin EC  81 mg Oral Daily  .  brinzolamide  1 drop Left Eye Q12H  . calcium-vitamin D  1 tablet Oral QODAY  . feeding supplement  1 Container Oral TID BM  . feeding supplement (ENSURE ENLIVE)  237 mL Oral BID BM  . haloperidol lactate  2 mg Intravenous Once  . heparin injection (subcutaneous)  5,000 Units Subcutaneous Q8H  . hydrALAZINE  50 mg Oral TID  . isosorbide mononitrate  60 mg Oral Daily  . latanoprost  1 drop Both Eyes QHS  . lip balm  1 application Topical BID  . metoprolol tartrate  25 mg Oral BID  . multivitamin with minerals  1 tablet Oral Daily  . polyethylene glycol  17 g Oral BID  . pravastatin  20 mg Oral q1800   . ondansetron (ZOFRAN) IV      Anti-infectives (From admission, onward)   Start     Dose/Rate Route Frequency Ordered Stop   01/02/19 1400  piperacillin-tazobactam (ZOSYN) IVPB 2.25 g  Status:  Discontinued     2.25 g 100 mL/hr over 30 Minutes Intravenous Every 8 hours 01/02/19 1223 01/02/19 1224   01/02/19 1400  piperacillin-tazobactam (ZOSYN) IVPB 2.25 g     2.25 g 100 mL/hr over 30 Minutes Intravenous Every 8 hours 01/02/19 1224 01/07/19 0650   01/02/19 7062  ceFAZolin (ANCEF) 2-4 GM/100ML-% IVPB  Status:  Discontinued    Note to Pharmacy:  Guerry Bruin   : cabinet override      01/02/19 0909 01/02/19 0916   01/02/19 0600  ceFAZolin (ANCEF) IVPB 2g/100 mL premix  Status:  Discontinued     2 g 200 mL/hr over 30 Minutes Intravenous On call to O.R. 01/01/19 1909 01/02/19 1218   01/02/19 0600  metroNIDAZOLE (FLAGYL) IVPB 500 mg     500 mg 100 mL/hr over 60 Minutes Intravenous On call to O.R. 01/01/19 1909 01/02/19 0836   01/01/19 1730  cefTRIAXone (ROCEPHIN) 2 g in sodium chloride 0.9 % 100 mL IVPB  Status:  Discontinued     2 g 200 mL/hr over 30 Minutes Intravenous  Once 01/01/19 1728 01/02/19 1218     Assessment/Plan  Hypertension stage II Combined systolic and diastolic congestive heart failure CAD/PAD CKD III COPD/history of tobacco use Post op confusion  Acute  gangrenous cholecystitis; Hx vagotomy antrectomy             Diagnostic laparoscopy, laparoscopic cholecystectomy, laparoscopic lysis of adhesion x 45 minutes, placement of drain, 01/02/2019, Dr. Michael Boston             -Postop bile leak; HIDA scan positive; 01/05/2019             -GI consult 01/05/2019             -ERCP 1/15; initial cholangiogram shows a cystic duct leak, biliary sphincterotomy placement of a 10 French by 7 cm plastic stent, Dr. Lizbeth Bark  FEN: IV fluids/DIII ID: Zosyn 1/11 - 01/07/19 DVT: SCDs;  heparin Follow-up: Dr. Remo Lipps Gross/Dr. Clarene Essex   Plan:  Dr Kae Heller noted he may need upsizing of the drain if drainage does not decrease.  GI signed off so I would check with them before he goes to SNF and see what their threshold is for up-sizing drain.  He will need a follow up arrangement with GI.  I have put follow up for our office and directions in the AVS.     LOS: 10 days    Jamonta Goerner 01/11/2019 [phone]

## 2019-01-11 NOTE — Care Management Important Message (Signed)
Important Message  Patient Details  Name: Nicholas Porter MRN: [id number] Date of Birth: April 12, 1930   Medicare Important Message Given:  Yes    Kerin Salen 01/11/2019, 2:00 [place] Message  Patient Details  Name: Nicholas Porter MRN: [id number] Date of Birth: [birth year]   Medicare Important Message Given:  Yes    Kerin Salen 01/11/2019, 2:00 PM

## 2019-01-12 DIAGNOSIS — K8000 Calculus of gallbladder with acute cholecystitis without obstruction: Secondary | ICD-10-CM | POA: Diagnosis not present

## 2019-01-12 DIAGNOSIS — I10 Essential (primary) hypertension: Secondary | ICD-10-CM | POA: Diagnosis not present

## 2019-01-12 DIAGNOSIS — K9189 Other postprocedural complications and disorders of digestive system: Secondary | ICD-10-CM | POA: Diagnosis not present

## 2019-01-12 DIAGNOSIS — I2510 Atherosclerotic heart disease of native coronary artery without angina pectoris: Secondary | ICD-10-CM | POA: Diagnosis not present

## 2019-01-13 DIAGNOSIS — L89620 Pressure ulcer of left heel, unstageable: Secondary | ICD-10-CM | POA: Diagnosis not present

## 2019-01-14 DIAGNOSIS — I2510 Atherosclerotic heart disease of native coronary artery without angina pectoris: Secondary | ICD-10-CM | POA: Diagnosis not present

## 2019-01-14 DIAGNOSIS — J449 Chronic obstructive pulmonary disease, unspecified: Secondary | ICD-10-CM | POA: Diagnosis not present

## 2019-01-14 DIAGNOSIS — E46 Unspecified protein-calorie malnutrition: Secondary | ICD-10-CM | POA: Diagnosis not present

## 2019-01-14 DIAGNOSIS — R531 Weakness: Secondary | ICD-10-CM | POA: Diagnosis not present

## 2019-01-14 DIAGNOSIS — I509 Heart failure, unspecified: Secondary | ICD-10-CM | POA: Diagnosis not present

## 2019-01-14 DIAGNOSIS — N183 Chronic kidney disease, stage 3 (moderate): Secondary | ICD-10-CM | POA: Diagnosis not present

## 2019-01-14 DIAGNOSIS — I739 Peripheral vascular disease, unspecified: Secondary | ICD-10-CM | POA: Diagnosis not present

## 2019-01-18 ENCOUNTER — Telehealth: Payer: Self-pay | Admitting: Cardiology

## 2019-01-18 NOTE — Telephone Encounter (Signed)
Patient's wife called stating that her husband is in rehab. She stated that they have him on different heart medication and she doesn't know the names.  She would like for Dr. Allison Quarry nurse to call the rehab place to find out why they have him on them.  She also states that when he was in the hospital they had him in three different BP pills, and he was seeing things she would like to know what they were.

## 2019-01-18 NOTE — Telephone Encounter (Signed)
Spoke with patient's wife , she state patient is in rehab , after being in [place] for gallbladder  Removal . Wife states patient was in the bed for 3 weeks.  Wife  Wanted  Office to cal an change medication , but she did not know what medication were  Changed. RN informed wife  That  Dr Ellyn Hack COULD NOT MAKE ANY CHANGES UNTIL OFFICE VISIT IF NEEDED.    WIFE STATES BLOOD PRESSURE WAS @120 /?.    RN informed wife  That was in good range and if blood pressure as been running in that range medication is doing its job. Wife could ask to speak to rehab PA or doctor of patient's care. Wife verbalized undesrtanding

## 2019-01-19 DIAGNOSIS — I5042 Chronic combined systolic (congestive) and diastolic (congestive) heart failure: Secondary | ICD-10-CM | POA: Diagnosis not present

## 2019-01-19 DIAGNOSIS — N183 Chronic kidney disease, stage 3 (moderate): Secondary | ICD-10-CM | POA: Diagnosis not present

## 2019-01-19 DIAGNOSIS — K8000 Calculus of gallbladder with acute cholecystitis without obstruction: Secondary | ICD-10-CM | POA: Diagnosis not present

## 2019-01-19 DIAGNOSIS — K9189 Other postprocedural complications and disorders of digestive system: Secondary | ICD-10-CM | POA: Diagnosis not present

## 2019-01-19 DIAGNOSIS — R634 Abnormal weight loss: Secondary | ICD-10-CM | POA: Diagnosis not present

## 2019-01-19 DIAGNOSIS — R932 Abnormal findings on diagnostic imaging of liver and biliary tract: Secondary | ICD-10-CM | POA: Diagnosis not present

## 2019-01-20 ENCOUNTER — Other Ambulatory Visit: Payer: Self-pay | Admitting: *Deleted

## 2019-01-20 ENCOUNTER — Ambulatory Visit: Payer: Medicare Other | Admitting: Adult Health

## 2019-01-20 DIAGNOSIS — L89620 Pressure ulcer of left heel, unstageable: Secondary | ICD-10-CM | POA: Diagnosis not present

## 2019-01-20 NOTE — Patient Outreach (Signed)
[facility]) Care Management  01/20/2019  Nicholas Porter 1930/12/11 [id number]   Onsite visit to facility.  Met with patient in his room. Patient is doing well with therapy. He reports he was relatively healthy until his surgery. He was only on 2 medications.  He reports he lives with family and plans to return. His daughter is Brunilda Payor, unable to give phone number for daughter.   Left a [facility] CM brochure with patient and RNCM contact.  Plan to follow up closer to discharge and monitor for [facility] care management needs.  Royetta Crochet. Laymond Purser, MSN, RN, [employer] , [place] [phone]) Business Cell  [phone]) Toll Free Office

## 2019-01-24 DIAGNOSIS — K8000 Calculus of gallbladder with acute cholecystitis without obstruction: Secondary | ICD-10-CM | POA: Diagnosis not present

## 2019-01-24 DIAGNOSIS — R040 Epistaxis: Secondary | ICD-10-CM | POA: Diagnosis not present

## 2019-01-24 DIAGNOSIS — K9189 Other postprocedural complications and disorders of digestive system: Secondary | ICD-10-CM | POA: Diagnosis not present

## 2019-01-24 DIAGNOSIS — I10 Essential (primary) hypertension: Secondary | ICD-10-CM | POA: Diagnosis not present

## 2019-01-26 ENCOUNTER — Encounter: Payer: Self-pay | Admitting: Cardiology

## 2019-01-26 ENCOUNTER — Ambulatory Visit (INDEPENDENT_AMBULATORY_CARE_PROVIDER_SITE_OTHER): Payer: Medicare Other | Admitting: Cardiology

## 2019-01-26 VITALS — BP 120/50 | HR 64 | Ht 67.0 in | Wt 100.6 lb

## 2019-01-26 DIAGNOSIS — K9189 Other postprocedural complications and disorders of digestive system: Secondary | ICD-10-CM | POA: Diagnosis not present

## 2019-01-26 DIAGNOSIS — I350 Nonrheumatic aortic (valve) stenosis: Secondary | ICD-10-CM | POA: Diagnosis not present

## 2019-01-26 DIAGNOSIS — R040 Epistaxis: Secondary | ICD-10-CM

## 2019-01-26 DIAGNOSIS — I951 Orthostatic hypotension: Secondary | ICD-10-CM | POA: Diagnosis not present

## 2019-01-26 DIAGNOSIS — I2119 ST elevation (STEMI) myocardial infarction involving other coronary artery of inferior wall: Secondary | ICD-10-CM | POA: Diagnosis not present

## 2019-01-26 DIAGNOSIS — Z9861 Coronary angioplasty status: Secondary | ICD-10-CM

## 2019-01-26 DIAGNOSIS — I2510 Atherosclerotic heart disease of native coronary artery without angina pectoris: Secondary | ICD-10-CM | POA: Diagnosis not present

## 2019-01-26 DIAGNOSIS — I340 Nonrheumatic mitral (valve) insufficiency: Secondary | ICD-10-CM | POA: Diagnosis not present

## 2019-01-26 DIAGNOSIS — E785 Hyperlipidemia, unspecified: Secondary | ICD-10-CM | POA: Diagnosis not present

## 2019-01-26 DIAGNOSIS — K8000 Calculus of gallbladder with acute cholecystitis without obstruction: Secondary | ICD-10-CM | POA: Diagnosis not present

## 2019-01-26 DIAGNOSIS — I6522 Occlusion and stenosis of left carotid artery: Secondary | ICD-10-CM

## 2019-01-26 DIAGNOSIS — I5042 Chronic combined systolic (congestive) and diastolic (congestive) heart failure: Secondary | ICD-10-CM | POA: Diagnosis not present

## 2019-01-26 NOTE — Progress Notes (Signed)
PCP: Dorothyann Peng, NP  Clinic Note: Chief Complaint  Patient presents with  . Hospitalization Follow-up    medication ?s - ASA & new meds  . Coronary Artery Disease  . Epistaxis    HPI: Nicholas Porter is a 85 y.o. male with a PMH notedd below who presents today for post-hospital f/u.  CVD History:  H/O Inferior STEMI (09/2013) emergent PCI with Promus DES to RCA (3.0 mm x 28 mm - 3.2 mm)  Unstable Angina 05/2014: ISR - cutting balloon PTCA ; Ostial OM2 80% (stable) -  Most recent Echo and Myoview April 2018: EF 51%.  Low risk.  Echo with EF 45 to 50%.  GR 1 DD.  Mild AI and TR.  Mild to moderate MR.  L Carotid Dz - s/p CEA 03/2014   Nicholas Porter was last seen in Oct 2019 -- doing well from CV standpoint. BP was up & down - no change made b/c has had low BPs.  Not on Plavix - on ASA. Not interested in restarting statin.  Recent Hospitalizations:   Hospital 01/11/2019: Acute Gangrenous Cholecystitis - Lap Chole (ERCP). Delayed cystic duct stump bile leak s/p ERCP/stent 01/06/2019  Now on Imdur & Hydralazine   Studies Personally Reviewed - (if available, images/films reviewed: From Epic Chart or Care Everywhere)  TTE 12/2018: EF 55-60%. No RWMA. Gr1 DD. Mod AS with mild AI. Severe LA dilation.   Interval History: Nicholas Porter presents here today for hospital follow-up after being admitted for acute gangrenous cholecystitis.  During that hospitalization he was noted to be hypertensive and was started on hydralazine and nitrate combination.  The family is very concerned about the medicines he was on.  They had initially stopped aspirin during hospital stay, but this was restarted in the rehab nursing facility. Tiffany Kocher seems to be getting stronger as the days go by.  He is maintaining his weight now with better p.o. intake.  He is doing well with his rehab, stating that he is gradually getting stronger, but he still looks very weak and also cachectic in his wheelchair.  Although his  weight is stable, he is just looks much more debilitated than last visit.  He states that he is now able to get up by himself and go to the bathroom alone without any assistance.  Despite all of his issues that he had in the hospital, he did not have any anginal symptoms with rest or exertion.  No PND, orthopnea or edema.  No rapid irregular heartbeats palpitations.  No syncope or near syncope with exception of when he first got sick. He has not had any melena or hematochezia or hematuria, but he has had a pretty significant nosebleed over the last couple days.  No TIA/amaurosis fugax symptoms. No claudication.  ROS: A comprehensive was performed. Review of Systems  Constitutional: Positive for malaise/fatigue (Gradually rebuilding strength from hospital stay.  Still very weak).  HENT: Positive for nosebleeds (See HPI). Negative for congestion.   Respiratory: Negative for cough, shortness of breath and wheezing.   Gastrointestinal: Positive for abdominal pain (Postop abdominal pain is gradually getting better). Negative for blood in stool, constipation, diarrhea, heartburn and melena.  Genitourinary: Negative for hematuria.  Musculoskeletal: Negative for falls and joint pain.  Neurological: Positive for dizziness (Sometimes with positional change), tingling ( in his hands and feet sometimes) and weakness (Generalized). Negative for focal weakness.  Psychiatric/Behavioral: Positive for hallucinations (There little bit worried because since the hospital stay and in the rehab  facility, he has been having episodes of seeing things that are not there, no auditory hallucinations.).  All other systems reviewed and are negative.   I have reviewed and (if needed) personally updated the patient's problem list, medications, allergies, past medical and surgical history, social and family history.   Past Medical History:  Diagnosis Date  . Acute metabolic encephalopathy 12/28/1094  . Acute renal failure  superimposed on stage 4 chronic kidney disease ([place]) 11/27/2015  . Anemia of chronic renal failure, stage 4 (severe) ([place]) 11/27/2015  . 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 CAD S/P percutaneous coronary angioplasty - DES in RCA with PTCA for ISR; Moderate mLAD, 80% ostial OM2 stable 05/22/2008   Qualifier: Diagnosis of  By: Linda Hedges MD, Heinz Knuckles   . CKD (chronic kidney disease) stage 3, GFR 30-59 ml/min ([place])   . COPD (chronic obstructive pulmonary disease) ([place])   . Essential hypertension 06/30/2007   Qualifier: Diagnosis of  By: Cori Razor RN, Mikal Plane Eye abnormalities    injections in eyes 12/2017  . H/O Inferior STEMI (09/2013) emergent PCI with Promus DES to RCA (3.0 mm  x 28 mm - 3.2 mm) 10/05/2013  . Klebsiella sepsis ([place]) 04/02/2017  . Left-sided carotid artery disease -- s/p CEA 04/06/2006   S/p Left CEA   . Mitral regurgitation - onexam 09/03/2016    Past Surgical History:  Procedure Laterality Date  . AMPUTATION     left index finger -tramatic  . BILIARY STENT PLACEMENT N/A 01/06/2019   Procedure: BILIARY STENT PLACEMENT;  Surgeon: Clarene Essex, MD;  Location: [facility];  Service: Endoscopy;  Laterality: N/A;  . CARDIAC CATHETERIZATION  November 2011   40% mid LAD, 50% proximal RCA, 30-40% distal RCA (DOMINANT RCA with wraparound PDA & 2 large PLBs), 60-70% ostial OM1. No change from 2001. Medical therapy  . CAROTID ENDARTERECTOMY Left 2007   Dr.Hayes: 08/2016 Dopplers: Stable less than 40% right internal carotid stenosis and stable moderate 40-60% left internal carotid stenosis. Patent vertebral and subclavian arteries.  . CARPAL TUNNEL RELEASE    . CHOLECYSTECTOMY N/A 01/02/2019   Procedure: LAPAROSCOPIC CHOLECYSTECTOMY;  Surgeon: Michael Boston, MD;  Location: [facility];  Service: General;  Laterality: N/A;  . CORNEAL TRANSPLANT    . CORONARY ANGIOPLASTY WITH STENT PLACEMENT  09/2013; 05/2014      d) Promus DES 3.0 mm x 28 mm (3.2 mm)  to RCA with STEMI; residual 70% mid-distal LAD, OM 270-80%. Medical management; b) PTCA of 75 % ISR , LAD lesion noted as ~40%  . ERCP N/A 01/06/2019   Procedure: ENDOSCOPIC RETROGRADE CHOLANGIOPANCREATOGRAPHY (ERCP);  Surgeon: Clarene Essex, MD;  Location: [facility];  Service: Endoscopy;  Laterality: N/A;  . FOOT SURGERY     left  . HERNIA REPAIR    . LEFT HEART CATHETERIZATION WITH CORONARY ANGIOGRAM N/A 10/05/2013   Procedure: LEFT HEART CATHETERIZATION WITH CORONARY ANGIOGRAM;  Surgeon: Peter M Martinique, MD;  Location: [facility] CATH LAB;  Service: Cardiovascular;  RCA 100% - PCI,CxOM stable, LAD ~70% mid;  . LEFT HEART CATHETERIZATION WITH CORONARY ANGIOGRAM N/A 06/08/2014   Procedure: LEFT HEART CATHETERIZATION WITH CORONARY ANGIOGRAM;  Surgeon: Troy Sine, MD;  Location: [facility] CATH LAB;  Service: Cardiovascular;  UA 6/'15: 75% ISR in RCA - PTCA, LAD lesion ~40%, CxOM stable.    Marland Kitchen NM MYOVIEW LTD  03/2017    - Elevatred Troponin in  setting of Sepsis -- DEMAND ISCHEMIA.  Normal perfusion. LVEF 51% with normal wall motion. This is a low risk study.  Marland Kitchen PARTIAL GASTRECTOMY    . PERCUTANEOUS CORONARY INTERVENTION-BALLOON ONLY  06/08/2014   Procedure: PERCUTANEOUS CORONARY INTERVENTION-BALLOON ONLY;  Surgeon: Troy Sine, MD;  Location: [facility] CATH LAB;  Service: Cardiovascular;;  . ROTATOR CUFF REPAIR     left  . SHOULDER ARTHROSCOPY WITH ROTATOR CUFF REPAIR AND SUBACROMIAL DECOMPRESSION Right 07/01/2013   Procedure: RIGHT SHOULDER ARTHROSCOPY WITH  SUBACROMIAL DECOMPRESSION/DISTAL CLAVICLE RESECTION/ROTATOR CUFF REPAIR ;  Surgeon: Marin Shutter, MD;  Location: [facility];  Service: Orthopedics;  Laterality: Right;  . SPHINCTEROTOMY  01/06/2019   Procedure: SPHINCTEROTOMY;  Surgeon: Clarene Essex, MD;  Location: [facility];  Service: Endoscopy;;  . SPINE SURGERY  1985   cervical laminectomy  . TOTAL KNEE ARTHROPLASTY    . TRANSTHORACIC ECHOCARDIOGRAM  09/'17; 4/'18;  1/'20   a) Normal EF 55-60%. Mild to moderate AS with mod AI. Mild MR.;; b) Mild global reduction in LV systolic function: 83-41%; grade 1 DD - high LVEDP. Mild AS/AI/TR. Mild-Mod MR; moderately elevated PAP.;; c) TTE 12/2018: EF 55-60%. No RWMA. Gr1 DD. Mod AS with mild AI. Severe LA dilation.  Marland Kitchen VAGOTOMY     partial gastrectomy   From last visit  aspirin EC 81 MG tabletTake 1 tablet (81 mg total) by mouth daily.   . brinzolamide (AZOPT) 1 % ophthalmic suspension Place 1 drop into the left eye every 12 (twelve) hours.   . metoprolol succinate (TOPROL-XL) 25 MG 24 hr tablet Take 1 tablet (25 mg total) by mouth daily.  Marland Kitchen torsemide (DEMADEX) 20 MG tablet Take 40 mg by mouth daily.  . TRAVATAN Z 0.004 % SOLN ophthalmic solution     Current Meds  Medication Sig  . acetaminophen (TYLENOL) 325 MG tablet Take 650 mg by mouth every 6 (six) hours as needed for mild pain or headache.  Marland Kitchen aspirin 81 MG chewable tablet Chew 81 mg by mouth daily.  . brinzolamide (AZOPT) 1 % ophthalmic suspension Place 1 drop into the left eye every 12 (twelve) hours.   . calcium carbonate (OSCAL) 1500 (600 Ca) MG TABS tablet Take 1,500 mg by mouth daily with breakfast.  . CALCIUM PO Take 1 tablet by mouth every other day.  . hydrALAZINE (APRESOLINE) 50 MG tablet Take 1 tablet (50 mg total) by mouth 3 (three) times daily.  . isosorbide mononitrate (IMDUR) 60 MG 24 hr tablet Take 1 tablet (60 mg total) by mouth daily.  . metoprolol tartrate (LOPRESSOR) 25 MG tablet Take 1 tablet (25 mg total) by mouth 2 (two) times daily.  . Multiple Vitamin (MULTIVITAMIN WITH MINERALS) TABS tablet Take 1 tablet by mouth daily.  . nitroGLYCERIN (NITROSTAT) 0.4 MG SL tablet DISSOLVE 1 TABLET UNDER THE TONGUE EVERY 5 MINUTES AS NEEDED FOR CHEST PAIN (Patient taking differently: Place 0.4 mg under the tongue every 5 (five) minutes as needed for chest pain. DISSOLVE 1 TABLET UNDER THE TONGUE EVERY 5 MINUTES AS NEEDED FOR CHEST PAIN)  . TRAVATAN  Z 0.004 % SOLN ophthalmic solution Place 1 drop into both eyes at bedtime.   . [DISCONTINUED] aspirin EC 81 MG tablet Take 81 mg by mouth daily.    Allergies  Allergen Reactions  . Amlodipine Besylate Hives  . Hydralazine Itching  . Lipitor [Atorvastatin] Itching  . Naproxen Diarrhea  . Oxycodone-Acetaminophen Itching  . Isosorbide Rash    Social History   Tobacco Use  .  Smoking status: Former Smoker    Types: Cigarettes    Last attempt to quit: 12/23/1964    Years since quitting: 54.1  . Smokeless tobacco: Never Used  Substance Use Topics  . Alcohol use: No  . Drug use: No   Social History   Social History Narrative   He is married with 2 children. He lives with his wife.   Very active at home, prior to a STEMI, he could walk several miles without discomfort. Prior to his MI, he would use his push mower to mow his entire lawn.   He is a former smoker and quit back in 1966. He does not drink alcohol  CODE STATUS: DO NOT RESUSCITATE  family history includes Diabetes in his sister; Heart disease in his sister.  Wt Readings from Last 3 Encounters:  01/26/19 100 lb 9.6 oz (45.6 kg)  01/01/19 95 lb 10.9 oz (43.4 kg)  10/21/18 101 lb (45.8 kg)  - gaining weight  PHYSICAL EXAM BP (!) 120/50   Pulse 64   Ht 5\' 7"  (1.702 m)   Wt 100 lb 9.6 oz (45.6 kg)   BMI 15.76 kg/m  Physical Exam  Constitutional: He is oriented to person, place, and time. No distress.  Still thin, frail borderline cachectic appearing --although nontoxic, he is somewhat chronically ill appearing.  However his weight is stable now.  HENT:  Head: Normocephalic and atraumatic.  Temporal wasting  Neck: Normal range of motion. Neck supple. No hepatojugular reflux and no JVD present. Carotid bruit is not present.  Cardiovascular: Normal rate and regular rhythm.  No extrasystoles are present. PMI is not displaced (Hyperdynamic precordium, likely because of minimal muscle mass.  PMI nondisplaced, but is  sustained.). Exam reveals no gallop, no friction rub and no decreased pulses.  Murmur heard.  Medium-pitched harsh crescendo-decrescendo mid to late systolic murmur is present with a grade of 3/6 at the upper right sternal border radiating to the neck. High-pitched blowing decrescendo early diastolic murmur is present with a grade of 1/6 at the upper right sternal border radiating to the apex. Pulmonary/Chest: Effort normal and breath sounds normal. No respiratory distress. He has no wheezes. He has no rales.  Abdominal: Soft. Bowel sounds are normal. He exhibits no distension.  Pretty much scaphoid abdomen.  He has his biliary drain coming out of the midline.  No right upper quadrant tenderness.  Musculoskeletal: Normal range of motion.  Neurological: He is alert and oriented to person, place, and time.  Psychiatric:  Despite seemingly quite weak, he seems to be quite jovial and happy to be out of the hospital.  Hoping to get home soon.  Vitals reviewed.    Adult ECG Report n/a  Other studies Reviewed: Additional studies/ records that were reviewed today include:  Recent Labs:   Lab Results  Component Value Date   CREATININE 1.84 (H) 01/10/2019   BUN 48 (H) 01/10/2019   NA 145 01/10/2019   K 4.5 01/10/2019   CL 121 (H) 01/10/2019   CO2 15 (L) 01/10/2019   Lab Results  Component Value Date   CHOL 122 07/20/2015   HDL 37.90 (L) 07/20/2015   LDLCALC 63 07/20/2015   TRIG 106.0 07/20/2015   CHOLHDL 3 07/20/2015   CBC Latest Ref Rng & Units 01/10/2019 01/09/2019 01/08/2019  WBC 4.0 - 10.5 K/uL 9.3 10.0 10.0  Hemoglobin 13.0 - 17.0 g/dL 10.2(L) 11.0(L) 11.2(L)  Hematocrit 39.0 - 52.0 % 31.5(L) 34.1(L) 34.4(L)  Platelets 150 - 400 K/uL  147(L) 145(L) 130(L)    ASSESSMENT / PLAN: Problem List Items Addressed This Visit    CAD S/P percutaneous coronary angioplasty - DES in RCA with PTCA for ISR; Moderate mLAD, 80% ostial OM2 stable (Chronic)    Doing well with no further angina  symptoms.  We have recently evaluated with a Myoview in April 2018 that was low risk. He is now on Imdur along with metoprolol.  He self stopped his statin Currently on aspirin and beta-blocker with no active angina symptoms.  I think it would be okay to hold aspirin intermittently for nosebleeds.  See patient instructions.      Relevant Medications   aspirin 81 MG chewable tablet   Epistaxis, recurrent - Primary    Probably related to wearing nasal cannula oxygen while in the hospital.  Recommendations: STOP TAKING ASPIRIN FOR ONE WEEK RESTART ON 02/02/19- UNTIL NOSEBLEED IS STABLE-IFRECUR IT IS OKAY TO HOLD ASPIRIN DOSE FOR 3 DAYS - RECOMMNED USING NASAL SALINE DROPS TWICE A DAY AND VASELINE IN BOTH NASAL PASSAGES.       H/O Inferior STEMI (09/2013) emergent PCI with Promus DES to RCA (3.0 mm  x 28 mm - 3.2 mm) (Chronic)    Fixed inferoapical defect on Myoview in 2015.  However despite this his EF appears to be preserved.  No heart failure symptoms and no recurrent angina.      Relevant Medications   aspirin 81 MG chewable tablet   Hyperlipidemia with target LDL less than 70 (Chronic)    Labs have been theoretically followed by PCP but not all have any recent levels.  He is not on a statin because of prior intolerance --he is stopped it just about every time we try to start..  At this point, he would be reluctant to take any more medicines.      Relevant Medications   aspirin 81 MG chewable tablet   Left-sided carotid artery disease -- s/p CEA (Chronic)    Due for follow-up carotid Dopplers in July.  Can order in follow-up.      Relevant Medications   aspirin 81 MG chewable tablet   Mitral regurgitation - on exam    No significant MR seen on echo.  I do not hear MR today.      Relevant Medications   aspirin 81 MG chewable tablet   Moderate aortic stenosis by prior echocardiogram (Chronic)    Most recent echo shows moderate aortic stenosis.  No symptoms.  Probably can  hold off on follow-up echocardiogram for about a year or so.      Relevant Medications   aspirin 81 MG chewable tablet   Orthostatic hypotension    Patient has history of orthostatic hypotension despite having intermittent episodes of hypertension.  As result, I have been less than aggressive with treating his blood pressure.  He is now on hydralazine and Imdur along with his low-dose metoprolol.  We will continue to monitor as he seems to be doing quite well.  For now I am not to make any changes that would potentially delay his discharge from rehab etc. However if he is orthostatic, would simply hold hydralazine. Recommendation going home would probably be to convert to hydralazine twice daily as opposed to 3 times daily.  We will discuss potentially converting to carvedilol alone and follow-up visits.      Relevant Medications   aspirin 81 MG chewable tablet      I spent a total of ~45-50 minutes with the patient  and chart review. >  50% of the time was spent in direct patient consultation.  We reviewed all of his medications and the events of occurred in the hospital.  We discussed the new medication started and options going forward.  Current medicines are reviewed at length with the patient today.  (+/- concerns) ?? What to do with ASA & nosebleeds; ? What new meds is he on  The following changes have been made:  explained reasoning for Hydralazine/Imdur (discussed plans to potentially convert to other options in f/u pending improvement of renal Fxn. (easiest would be to convert to Coreg from Metoprolol & wean off Hydral/Imdur)  Patient Instructions  Medication Instructions:  STOP TAKING ASPIRIN FOR ONE WEEK RESTART ON 02/02/19- UNTIL NOSEBLEED IS STABLE-IFRECUR IT IS OKAY TO HOLD ASPIRIN DOSE FOR 3 DAYS - RECOMMNED USING NASAL SALINE DROPS TWICE A DAY AND VASELINE IN BOTH NASAL PASSAGES.  CONTINUE CURRENT MEDICAITONS  If you need a refill on your cardiac medications before your  next appointment, please call your pharmacy.   Lab work: Not needed If you have labs (blood work) drawn today and your tests are completely normal, you will receive your results only by: Marland Kitchen MyChart Message (if you have MyChart) OR . A paper copy in the mail If you have any lab test that is abnormal or we need to change your treatment, we will call you to review the results.  Testing/Procedures: Not needed  Follow-Up: At [facility], you and your health needs are our priority.  As part of our continuing mission to provide you with exceptional heart care, we have created designated Provider Care Teams.  These Care Teams include your primary Cardiologist (physician) and Advanced Practice Providers (APPs -  Physician Assistants and Nurse Practitioners) who all work together to provide you with the care you need, when you need it. You will need a follow up appointment in 3 months MAY 2020.  Please call our office 2 months in advance to schedule this appointment.  You may see Glenetta Hew, MD or one of the following Advanced Practice Providers on your designated Care Team:   Rosaria Ferries, PA-C . Jory Sims, DNP, ANP  Any Other Special Instructions Will Be Listed Below (If Applicable).    Studies Ordered:   No orders of the defined types were placed in this encounter.     Glenetta Hew, M.D., M.S. Interventional Cardiologist   Pager # [phone] Phone # [phone] [address] [place], [place] [address]   Thank you for choosing Heartcare at [facility]!!

## 2019-01-26 NOTE — Patient Instructions (Signed)
Medication Instructions:  STOP TAKING ASPIRIN FOR ONE WEEK RESTART ON 02/02/19- UNTIL NOSEBLEED IS STABLE-IFRECUR IT IS OKAY TO HOLD ASPIRIN DOSE FOR 3 DAYS - RECOMMNED USING NASAL SALINE DROPS TWICE A DAY AND VASELINE IN BOTH NASAL PASSAGES.  CONTINUE CURRENT MEDICAITONS  If you need a refill on your cardiac medications before your next appointment, please call your pharmacy.   Lab work: Not needed If you have labs (blood work) drawn today and your tests are completely normal, you will receive your results only by: Marland Kitchen MyChart Message (if you have MyChart) OR . A paper copy in the mail If you have any lab test that is abnormal or we need to change your treatment, we will call you to review the results.  Testing/Procedures: Not needed  Follow-Up: At [facility], you and your health needs are our priority.  As part of our continuing mission to provide you with exceptional heart care, we have created designated Provider Care Teams.  These Care Teams include your primary Cardiologist (physician) and Advanced Practice Providers (APPs -  Physician Assistants and Nurse Practitioners) who all work together to provide you with the care you need, when you need it. You will need a follow up appointment in 3 months MAY 2020.  Please call our office 2 months in advance to schedule this appointment.  You may see Glenetta Hew, MD or one of the following Advanced Practice Providers on your designated Care Team:   Rosaria Ferries, PA-C . Jory Sims, DNP, ANP  Any Other Special Instructions Will Be Listed Below (If Applicable).

## 2019-01-27 DIAGNOSIS — L89620 Pressure ulcer of left heel, unstageable: Secondary | ICD-10-CM | POA: Diagnosis not present

## 2019-01-28 ENCOUNTER — Encounter: Payer: Self-pay | Admitting: Cardiology

## 2019-01-28 DIAGNOSIS — R040 Epistaxis: Secondary | ICD-10-CM | POA: Insufficient documentation

## 2019-01-28 DIAGNOSIS — I350 Nonrheumatic aortic (valve) stenosis: Secondary | ICD-10-CM | POA: Insufficient documentation

## 2019-01-28 NOTE — Assessment & Plan Note (Signed)
Due for follow-up carotid Dopplers in July.  Can order in follow-up.

## 2019-01-28 NOTE — Assessment & Plan Note (Signed)
Most recent echo shows moderate aortic stenosis.  No symptoms.  Probably can hold off on follow-up echocardiogram for about a year or so.

## 2019-01-28 NOTE — Assessment & Plan Note (Addendum)
Labs have been theoretically followed by PCP but not all have any recent levels.  He is not on a statin because of prior intolerance --he is stopped it just about every time we try to start..  At this point, he would be reluctant to take any more medicines.

## 2019-01-28 NOTE — Assessment & Plan Note (Signed)
No significant MR seen on echo.  I do not hear MR today.

## 2019-01-28 NOTE — Assessment & Plan Note (Addendum)
Doing well with no further angina symptoms.  We have recently evaluated with a Myoview in April 2018 that was low risk. He is now on Imdur along with metoprolol.  He self stopped his statin Currently on aspirin and beta-blocker with no active angina symptoms.  I think it would be okay to hold aspirin intermittently for nosebleeds.  See patient instructions.

## 2019-01-28 NOTE — Assessment & Plan Note (Signed)
Fixed inferoapical defect on Myoview in 2015.  However despite this his EF appears to be preserved.  No heart failure symptoms and no recurrent angina.

## 2019-01-28 NOTE — Assessment & Plan Note (Signed)
Patient has history of orthostatic hypotension despite having intermittent episodes of hypertension.  As result, I have been less than aggressive with treating his blood pressure.  He is now on hydralazine and Imdur along with his low-dose metoprolol.  We will continue to monitor as he seems to be doing quite well.  For now I am not to make any changes that would potentially delay his discharge from rehab etc. However if he is orthostatic, would simply hold hydralazine. Recommendation going home would probably be to convert to hydralazine twice daily as opposed to 3 times daily.  We will discuss potentially converting to carvedilol alone and follow-up visits.

## 2019-01-28 NOTE — Assessment & Plan Note (Signed)
Probably related to wearing nasal cannula oxygen while in the hospital.  Recommendations: STOP TAKING ASPIRIN FOR ONE WEEK RESTART ON 02/02/19- UNTIL NOSEBLEED IS STABLE-IFRECUR IT IS OKAY TO HOLD ASPIRIN DOSE FOR 3 DAYS - RECOMMNED USING NASAL SALINE DROPS TWICE A DAY AND VASELINE IN BOTH NASAL PASSAGES.

## 2019-02-01 DIAGNOSIS — K9189 Other postprocedural complications and disorders of digestive system: Secondary | ICD-10-CM | POA: Diagnosis not present

## 2019-02-01 DIAGNOSIS — N183 Chronic kidney disease, stage 3 (moderate): Secondary | ICD-10-CM | POA: Diagnosis not present

## 2019-02-01 DIAGNOSIS — I5042 Chronic combined systolic (congestive) and diastolic (congestive) heart failure: Secondary | ICD-10-CM | POA: Diagnosis not present

## 2019-02-01 DIAGNOSIS — K8000 Calculus of gallbladder with acute cholecystitis without obstruction: Secondary | ICD-10-CM | POA: Diagnosis not present

## 2019-02-04 DIAGNOSIS — I5042 Chronic combined systolic (congestive) and diastolic (congestive) heart failure: Secondary | ICD-10-CM | POA: Diagnosis not present

## 2019-02-04 DIAGNOSIS — E785 Hyperlipidemia, unspecified: Secondary | ICD-10-CM | POA: Diagnosis not present

## 2019-02-04 DIAGNOSIS — K219 Gastro-esophageal reflux disease without esophagitis: Secondary | ICD-10-CM | POA: Diagnosis not present

## 2019-02-04 DIAGNOSIS — Z9181 History of falling: Secondary | ICD-10-CM | POA: Diagnosis not present

## 2019-02-04 DIAGNOSIS — K9189 Other postprocedural complications and disorders of digestive system: Secondary | ICD-10-CM | POA: Diagnosis not present

## 2019-02-04 DIAGNOSIS — N184 Chronic kidney disease, stage 4 (severe): Secondary | ICD-10-CM | POA: Diagnosis not present

## 2019-02-04 DIAGNOSIS — J449 Chronic obstructive pulmonary disease, unspecified: Secondary | ICD-10-CM | POA: Diagnosis not present

## 2019-02-04 DIAGNOSIS — Z7982 Long term (current) use of aspirin: Secondary | ICD-10-CM | POA: Diagnosis not present

## 2019-02-04 DIAGNOSIS — I130 Hypertensive heart and chronic kidney disease with heart failure and stage 1 through stage 4 chronic kidney disease, or unspecified chronic kidney disease: Secondary | ICD-10-CM | POA: Diagnosis not present

## 2019-02-04 DIAGNOSIS — E43 Unspecified severe protein-calorie malnutrition: Secondary | ICD-10-CM | POA: Diagnosis not present

## 2019-02-04 DIAGNOSIS — I2510 Atherosclerotic heart disease of native coronary artery without angina pectoris: Secondary | ICD-10-CM | POA: Diagnosis not present

## 2019-02-04 DIAGNOSIS — I739 Peripheral vascular disease, unspecified: Secondary | ICD-10-CM | POA: Diagnosis not present

## 2019-02-04 DIAGNOSIS — N400 Enlarged prostate without lower urinary tract symptoms: Secondary | ICD-10-CM | POA: Diagnosis not present

## 2019-02-08 ENCOUNTER — Other Ambulatory Visit: Payer: Self-pay

## 2019-02-08 DIAGNOSIS — I130 Hypertensive heart and chronic kidney disease with heart failure and stage 1 through stage 4 chronic kidney disease, or unspecified chronic kidney disease: Secondary | ICD-10-CM | POA: Diagnosis not present

## 2019-02-08 DIAGNOSIS — I739 Peripheral vascular disease, unspecified: Secondary | ICD-10-CM | POA: Diagnosis not present

## 2019-02-08 DIAGNOSIS — I2510 Atherosclerotic heart disease of native coronary artery without angina pectoris: Secondary | ICD-10-CM | POA: Diagnosis not present

## 2019-02-08 DIAGNOSIS — J449 Chronic obstructive pulmonary disease, unspecified: Secondary | ICD-10-CM | POA: Diagnosis not present

## 2019-02-08 DIAGNOSIS — I5042 Chronic combined systolic (congestive) and diastolic (congestive) heart failure: Secondary | ICD-10-CM | POA: Diagnosis not present

## 2019-02-08 DIAGNOSIS — K9189 Other postprocedural complications and disorders of digestive system: Secondary | ICD-10-CM | POA: Diagnosis not present

## 2019-02-08 NOTE — Patient Outreach (Signed)
[facility]) Care Management  02/08/2019  Nicholas Porter March 15, 1930 [id number]   EMMI- general Discharge RED ON EMMI ALERT Day # 4 Date: 02/07/2019 Red Alert Reason:  Got discharge papers? No  Know who to call about changes in condition? No  Scheduled follow-up? No    Outreach attempt: spoke with spouse Nicholas Porter.  She states she is at work and patient at home. She states she has a few minutes for call.  She reports that patient has acted helpless but she knows he can do things.  She also started talking about patient being contrary to home health when given options about visits.  She states that home health is supposed to come out today at 3:30 pm.  Call got interrupted.  CM called back.  She finally verifies HIPAA. Addressed red alerts.  She states patient has appointment scheduled to see physician on tomorrow.  No problems with transportation to appointment.  Wife declines any other needs or concerns.    Plan: RN CM will close case.    Jone Baseman, RN, MSN [facility] Care Management Care Management Coordinator Direct Line [phone] Toll Free: [phone]  Fax: [phone]

## 2019-02-09 ENCOUNTER — Ambulatory Visit (INDEPENDENT_AMBULATORY_CARE_PROVIDER_SITE_OTHER): Payer: Medicare Other | Admitting: Adult Health

## 2019-02-09 ENCOUNTER — Encounter: Payer: Self-pay | Admitting: Adult Health

## 2019-02-09 ENCOUNTER — Ambulatory Visit (INDEPENDENT_AMBULATORY_CARE_PROVIDER_SITE_OTHER): Payer: Medicare Other

## 2019-02-09 VITALS — BP 150/70 | Temp 97.5°F | Wt 100.0 lb

## 2019-02-09 DIAGNOSIS — R6889 Other general symptoms and signs: Secondary | ICD-10-CM | POA: Diagnosis not present

## 2019-02-09 DIAGNOSIS — K8000 Calculus of gallbladder with acute cholecystitis without obstruction: Secondary | ICD-10-CM | POA: Diagnosis not present

## 2019-02-09 DIAGNOSIS — R1032 Left lower quadrant pain: Secondary | ICD-10-CM

## 2019-02-09 DIAGNOSIS — I10 Essential (primary) hypertension: Secondary | ICD-10-CM

## 2019-02-09 DIAGNOSIS — N183 Chronic kidney disease, stage 3 unspecified: Secondary | ICD-10-CM

## 2019-02-09 LAB — CBC WITH DIFFERENTIAL/PLATELET
BASOS PCT: 0.4 % (ref 0.0–3.0)
Basophils Absolute: 0 10*3/uL (ref 0.0–0.1)
Eosinophils Absolute: 0.2 10*3/uL (ref 0.0–0.7)
Eosinophils Relative: 4.3 % (ref 0.0–5.0)
HCT: 27.8 % — ABNORMAL LOW (ref 39.0–52.0)
Hemoglobin: 9.4 g/dL — ABNORMAL LOW (ref 13.0–17.0)
Lymphocytes Relative: 27.8 % (ref 12.0–46.0)
Lymphs Abs: 1.6 10*3/uL (ref 0.7–4.0)
MCHC: 33.8 g/dL (ref 30.0–36.0)
MCV: 100.8 fl — ABNORMAL HIGH (ref 78.0–100.0)
Monocytes Absolute: 0.5 10*3/uL (ref 0.1–1.0)
Monocytes Relative: 8.4 % (ref 3.0–12.0)
Neutro Abs: 3.3 10*3/uL (ref 1.4–7.7)
Neutrophils Relative %: 59.1 % (ref 43.0–77.0)
Platelets: 119 10*3/uL — ABNORMAL LOW (ref 150.0–400.0)
RBC: 2.75 Mil/uL — ABNORMAL LOW (ref 4.22–5.81)
RDW: 17.7 % — ABNORMAL HIGH (ref 11.5–15.5)
WBC: 5.7 10*3/uL (ref 4.0–10.5)

## 2019-02-09 LAB — BASIC METABOLIC PANEL
BUN: 47 mg/dL — ABNORMAL HIGH (ref 6–23)
CO2: 20 mEq/L (ref 19–32)
Calcium: 8.5 mg/dL (ref 8.4–10.5)
Chloride: 110 mEq/L (ref 96–112)
Creatinine, Ser: 1.87 mg/dL — ABNORMAL HIGH (ref 0.40–1.50)
GFR: 34.32 mL/min — AB (ref >60.00)
Glucose, Bld: 85 mg/dL (ref 70–99)
Potassium: 5.1 mEq/L (ref 3.5–5.1)
Sodium: 139 mEq/L (ref 135–145)

## 2019-02-09 NOTE — Progress Notes (Signed)
Subjective:    Patient ID: Nicholas Porter, male    DOB: 16-Jul-1930, 85 y.o.   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  He presents to the office today for  TCM visit.   Admit Date: 01/01/2019 Discharge Date: 01/11/2019 Discharge from rehab: 02/02/2019  Was admitted on 01/01/2019 with abdominal pain and found to have calculus cholecystitis  Hospital Course  1. Acute Calculus cholecystitis  - status post lap chole and lysis of adhesions on 01/02/2019 by Dr. gross.  The patient subsequently developed AKI and JP drain with high output.  He had a HIDA scan on 01/05/2019 showed an active bile leak and was sent back to the operating room on 15 2024 ERCP/stent placement.  2. HTN -appears as though he was switched from Toprol 25 mg to metoprolol 25 mg twice daily and started on hydralazine 50 mg 3 times daily.  Torsemide was discontinued BP Readings from Last 3 Encounters:  02/09/19 (!) 150/70  01/26/19 (!) 120/50  01/11/19 119/60   3.  Failure.  Patient with a history of combined systolic and diastolic dysfunction CHF, repeat echo with preserved EF of 55 to 60% with grade 1 in the setting of known CAD and past  supple ischemic cardiomyopathy  4. AKI -proved and back to baseline prior to discharge.  Today, [name] reports that he has noticed improvement since being discharged from the hospital and being at the rehab facility.  He feels as though his appetite is returning and he is having a craving for foods that he has not been able to eat in a long period of time.   His complaint today is continuing to always feel cold as well as having some left lower quadrant abdominal pain.  He reports left lower quadrant abdominal pain is been present intermittently since being discharged from the rehab facility.  He does report issues with constipation since his surgery and credits this to taking pain medication he reports that his last bowel movement was yesterday and that it was hard bowel movement.  He denies any nausea, vomiting, or fevers.  He is not feeling acutely ill  Wt Readings from Last 3 Encounters:  02/09/19 100 lb (45.4 kg)  01/26/19 100 lb 9.6 oz (45.6 kg)  01/01/19 95 lb 10.9 oz (43.4 kg)    Review of Systems  Constitutional: Positive for fatigue.  HENT: Negative.   Respiratory: Negative.   Cardiovascular: Negative.   Gastrointestinal: Positive for abdominal pain and constipation. Negative for abdominal distention, anal bleeding, blood in stool, diarrhea, nausea, rectal pain and vomiting.  Endocrine: Positive for cold intolerance.  Genitourinary: Negative.   Musculoskeletal: Positive for arthralgias.  Skin: Negative.   Allergic/Immunologic: Negative.   Neurological: Negative.   Hematological: Negative.   Psychiatric/Behavioral: Negative.   All other systems  reviewed and are negative.  Past Medical History:  Diagnosis Date  . Acute metabolic encephalopathy 08/31/8337  . Acute renal failure superimposed on stage 4 chronic kidney disease ([place]) 11/27/2015  . Anemia of chronic renal failure, stage 4 (severe) ([place]) 11/27/2015  . 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 CAD S/P percutaneous coronary angioplasty - DES in RCA with PTCA for ISR; Moderate mLAD, 80% ostial OM2 stable 05/22/2008   Qualifier: Diagnosis of  By: Linda Hedges MD, Heinz Knuckles   . CKD (chronic kidney disease) stage 3, GFR 30-59 ml/min ([place])   . COPD (chronic obstructive pulmonary disease) ([place])   . Essential hypertension 06/30/2007   Qualifier: Diagnosis of  By: Cori Razor RN, Mikal Plane Eye abnormalities    injections in eyes 12/2017  . H/O Inferior STEMI (09/2013) emergent PCI with Promus DES to RCA (3.0 mm  x 28 mm - 3.2 mm) 10/05/2013  . Klebsiella sepsis ([place]) 04/02/2017  . Left-sided carotid artery disease -- s/p CEA 04/06/2006   S/p Left CEA   . Mitral regurgitation - onexam 09/03/2016    Social History   Socioeconomic History  . Marital status: Married    Spouse name: Not on file  . Number of children: Not on file  . Years of education: Not on file  . Highest education level: Not on file  Occupational History    Employer: RETIRED    Comment: Retired  Scientific laboratory technician  . Financial resource strain: Not on file  . Food insecurity:    Worry: Not on file    Inability: Not on file  . Transportation needs:    Medical: Not on file    Non-medical: Not on file  Tobacco Use  . Smoking status: Former Smoker    Types: Cigarettes    Last attempt to quit: 12/23/1964    Years since quitting: 54.1  . Smokeless tobacco: Never Used  Substance and Sexual Activity  . Alcohol use: No  . Drug use: No  . Sexual activity: Not on file  Lifestyle  . Physical activity:    Days per week: Not on file    Minutes per session: Not on file  . Stress: Not on file  Relationships  . Social connections:    Talks on phone: Not on file    Gets together: Not on file    Attends religious service: Not on file    Active member of club or organization: Not on file    Attends meetings of clubs or organizations: Not on file    Relationship status:  Not on file  . Intimate partner violence:    Fear of current or ex partner: Not on file    Emotionally abused: Not on file    Physically abused: Not on file    Forced sexual activity: Not on file  Other Topics Concern  . Not on file  Social History Narrative   He is married with 2 children. He lives with his wife.   Very active at home, prior to a STEMI, he could walk several miles without discomfort. Prior to his MI, he would use his push mower to mow his entire lawn.   He is a former smoker and quit back in 1966. He does not drink alcohol    Past Surgical History:  Procedure Laterality Date  . AMPUTATION     left index finger -tramatic  .  BILIARY STENT PLACEMENT N/A 01/06/2019   Procedure: BILIARY STENT PLACEMENT;  Surgeon: Clarene Essex, MD;  Location: [facility];  Service: Endoscopy;  Laterality: N/A;  . CARDIAC CATHETERIZATION  November 2011   40% mid LAD, 50% proximal RCA, 30-40% distal RCA (DOMINANT RCA with wraparound PDA & 2 large PLBs), 60-70% ostial OM1. No change from 2001. Medical therapy  . CAROTID ENDARTERECTOMY Left 2007   Dr.Hayes: 08/2016 Dopplers: Stable less than 40% right internal carotid stenosis and stable moderate 40-60% left internal carotid stenosis. Patent vertebral and subclavian arteries.  . CARPAL TUNNEL RELEASE    . CHOLECYSTECTOMY N/A 01/02/2019   Procedure: LAPAROSCOPIC CHOLECYSTECTOMY;  Surgeon: Michael Boston, MD;  Location: [facility];  Service: General;  Laterality: N/A;  . CORNEAL TRANSPLANT    . CORONARY ANGIOPLASTY WITH STENT PLACEMENT  09/2013; 05/2014    d) Promus DES 3.0 mm x 28 mm (3.2 mm)  to RCA with STEMI; residual 70% mid-distal LAD, OM 270-80%. Medical management; b) PTCA of 75 % ISR , LAD lesion noted as ~40%  . ERCP N/A 01/06/2019   Procedure: ENDOSCOPIC RETROGRADE CHOLANGIOPANCREATOGRAPHY (ERCP);  Surgeon: Clarene Essex, MD;  Location: [facility];  Service: Endoscopy;  Laterality: N/A;  . FOOT SURGERY     left  . HERNIA REPAIR    . LEFT  HEART CATHETERIZATION WITH CORONARY ANGIOGRAM N/A 10/05/2013   Procedure: LEFT HEART CATHETERIZATION WITH CORONARY ANGIOGRAM;  Surgeon: Peter M Martinique, MD;  Location: [facility] CATH LAB;  Service: Cardiovascular;  RCA 100% - PCI,CxOM stable, LAD ~70% mid;  . LEFT HEART CATHETERIZATION WITH CORONARY ANGIOGRAM N/A 06/08/2014   Procedure: LEFT HEART CATHETERIZATION WITH CORONARY ANGIOGRAM;  Surgeon: Troy Sine, MD;  Location: [facility] CATH LAB;  Service: Cardiovascular;  UA 6/'15: 75% ISR in RCA - PTCA, LAD lesion ~40%, CxOM stable.    Marland Kitchen NM MYOVIEW LTD  03/2017    - Elevatred Troponin in setting of Sepsis -- DEMAND ISCHEMIA.  Normal perfusion. LVEF 51% with normal wall motion. This is a low risk study.  Marland Kitchen PARTIAL GASTRECTOMY    . PERCUTANEOUS CORONARY INTERVENTION-BALLOON ONLY  06/08/2014   Procedure: PERCUTANEOUS CORONARY INTERVENTION-BALLOON ONLY;  Surgeon: Troy Sine, MD;  Location: [facility] CATH LAB;  Service: Cardiovascular;;  . ROTATOR CUFF REPAIR     left  . SHOULDER ARTHROSCOPY WITH ROTATOR CUFF REPAIR AND SUBACROMIAL DECOMPRESSION Right 07/01/2013   Procedure: RIGHT SHOULDER ARTHROSCOPY WITH  SUBACROMIAL DECOMPRESSION/DISTAL CLAVICLE RESECTION/ROTATOR CUFF REPAIR ;  Surgeon: Marin Shutter, MD;  Location: [facility];  Service: Orthopedics;  Laterality: Right;  . SPHINCTEROTOMY  01/06/2019   Procedure: SPHINCTEROTOMY;  Surgeon: Clarene Essex, MD;  Location: [facility];  Service: Endoscopy;;  . SPINE SURGERY  1985   cervical laminectomy  . TOTAL KNEE ARTHROPLASTY    . TRANSTHORACIC ECHOCARDIOGRAM  09/'17; 4/'18; 1/'20   a) Normal EF 55-60%. Mild to moderate AS with mod AI. Mild MR.;; b) Mild global reduction in LV systolic function: 88-50%; grade 1 DD - high LVEDP. Mild AS/AI/TR. Mild-Mod MR; moderately elevated PAP.;; c) TTE 12/2018: EF 55-60%. No RWMA. Gr1 DD. Mod AS with mild AI. Severe LA dilation.  Marland Kitchen VAGOTOMY     partial gastrectomy    Family History  Problem Relation Age of Onset  . Heart disease  Sister        Heart Transplant   . Diabetes Sister     Allergies  Allergen Reactions  . Amlodipine Besylate Hives  . Hydralazine Itching  . Lipitor [Atorvastatin] Itching  .  Naproxen Diarrhea  . Oxycodone-Acetaminophen Itching  . Isosorbide Rash    Current Outpatient Medications on File Prior to Visit  Medication Sig Dispense Refill  . acetaminophen (TYLENOL) 325 MG tablet Take 650 mg by mouth every 6 (six) hours as needed for mild pain or headache.    Marland Kitchen aspirin 81 MG chewable tablet Chew 81 mg by mouth daily.    . brinzolamide (AZOPT) 1 % ophthalmic suspension Place 1 drop into the left eye every 12 (twelve) hours.     . calcium carbonate (OSCAL) 1500 (600 Ca) MG TABS tablet Take 1,500 mg by mouth daily with breakfast.    . CALCIUM PO Take 1 tablet by mouth every other day.    . hydrALAZINE (APRESOLINE) 50 MG tablet Take 1 tablet (50 mg total) by mouth 3 (three) times daily. 90 tablet 0  . isosorbide mononitrate (IMDUR) 60 MG 24 hr tablet Take 1 tablet (60 mg total) by mouth daily. 30 tablet 0  . metoprolol tartrate (LOPRESSOR) 25 MG tablet Take 1 tablet (25 mg total) by mouth 2 (two) times daily. 60 tablet 0  . Multiple Vitamin (MULTIVITAMIN WITH MINERALS) TABS tablet Take 1 tablet by mouth daily.    . nitroGLYCERIN (NITROSTAT) 0.4 MG SL tablet DISSOLVE 1 TABLET UNDER THE TONGUE EVERY 5 MINUTES AS NEEDED FOR CHEST PAIN (Patient taking differently: Place 0.4 mg under the tongue every 5 (five) minutes as needed for chest pain. DISSOLVE 1 TABLET UNDER THE TONGUE EVERY 5 MINUTES AS NEEDED FOR CHEST PAIN) 25 tablet 12  . TRAVATAN Z 0.004 % SOLN ophthalmic solution Place 1 drop into both eyes at bedtime.      No current facility-administered medications on file prior to visit.     BP (!) 150/70   Temp (!) 97.5 F (36.4 C)   Wt 100 lb (45.4 kg)   BMI 15.66 kg/m       Objective:   Physical Exam Vitals signs and nursing note reviewed.  Constitutional:      Appearance: He is  underweight.  Neck:     Musculoskeletal: Normal range of motion and neck supple.  Cardiovascular:     Rate and Rhythm: Normal rate and regular rhythm.     Pulses: Normal pulses.     Heart sounds: Normal heart sounds.  Pulmonary:     Effort: Pulmonary effort is normal.     Breath sounds: Normal breath sounds.  Abdominal:     General: Abdomen is flat. Bowel sounds are normal.     Tenderness: There is abdominal tenderness in the left lower quadrant. There is no rebound. Negative signs include Rovsing's sign and psoas sign.     Hernia: No hernia is present.     Comments: Hard stool felt in left lower quadrant.  Well healed surgical scars noted on abdomen. No signs of infection   Musculoskeletal: Normal range of motion.        General: No swelling, tenderness or signs of injury.     Left lower leg: No edema.  Skin:    General: Skin is warm and dry.     Capillary Refill: Capillary refill takes less than 2 seconds.     Coloration: Skin is pale.  Neurological:     General: No focal deficit present.     Mental Status: He is alert and oriented to person, place, and time. Mental status is at baseline.  Psychiatric:        Mood and Affect: Mood normal.  Behavior: Behavior normal.        Thought Content: Thought content normal.        Judgment: Judgment normal.       Assessment & Plan:  1. Acute cholecystitis due to biliary calculus - reviewed hospital discharge notes, imaging, labs and surgical procedures with patient and his wife. All questions answered to the best of my ability  - Follow up with surgery as directed - Continue with diet as tolerated - CBC with Differential/Platelet - Basic Metabolic Panel  2. LLQ pain - Likely constipation. No signs of infection or post surgical complication..  - Advised to increase fluids and fiber  - DG Abd 1 View; Future - CBC with Differential/Platelet - Basic Metabolic Panel  3. Cold intolerance - likely due to weight loss.  -  Encouraged to eat high protein foods. Can have boost breeze or Ensure.  Lab Results  Component Value Date   TSH 2.40 03/26/2018   - Iron, TIBC and Ferritin Panel  4. Essential hypertension - Continue with current medications  - CBC with Differential/Platelet - Basic Metabolic Panel  5. CKD (chronic kidney disease), stage III (HCC)  - CBC with Differential/Platelet - Basic Metabolic Panel - Iron, TIBC and Ferritin Panel  Dorothyann Peng, NP

## 2019-02-10 ENCOUNTER — Telehealth: Payer: Self-pay

## 2019-02-10 LAB — IRON,TIBC AND FERRITIN PANEL
%SAT: 19 % — AB (ref 20–48)
FERRITIN: 444 ng/mL — AB (ref 24–380)
Iron: 44 ug/dL — ABNORMAL LOW (ref 50–180)
TIBC: 232 mcg/dL (calc) — ABNORMAL LOW (ref 250–425)

## 2019-02-10 NOTE — Telephone Encounter (Signed)
Those are fine to continue

## 2019-02-10 NOTE — Telephone Encounter (Signed)
Copied from [place] [phone]. Topic: General - Other >> Feb 10, 2019 12:30 PM Windy Kalata wrote: Reason for CRM: Pts wife called asking if he should continue taking hyzralazine 50mg , metoprolol 25mg , Isosorbide 60mg ,  he was prescribed these meds when he left the nursing home, please advise?    Dr. Mariel Aloe who prescribed the meds,  [facility], she does not have their phone number  Best call back is [phone]

## 2019-02-11 MED ORDER — METOPROLOL TARTRATE 25 MG PO TABS: 25.0000 mg | ORAL_TABLET | Freq: Two times a day (BID) | ORAL | 0 refills | Status: DC

## 2019-02-11 MED ORDER — ISOSORBIDE MONONITRATE ER 60 MG PO TB24: 60.0000 mg | ORAL_TABLET | Freq: Every day | ORAL | 0 refills | Status: DC

## 2019-02-11 MED ORDER — HYDRALAZINE HCL 50 MG PO TABS: 50.0000 mg | ORAL_TABLET | Freq: Three times a day (TID) | ORAL | 0 refills | Status: DC

## 2019-02-11 NOTE — Telephone Encounter (Signed)
Pt needs refills on both.  How much do you want me to send in?

## 2019-02-18 DIAGNOSIS — I2510 Atherosclerotic heart disease of native coronary artery without angina pectoris: Secondary | ICD-10-CM | POA: Diagnosis not present

## 2019-02-18 DIAGNOSIS — J449 Chronic obstructive pulmonary disease, unspecified: Secondary | ICD-10-CM | POA: Diagnosis not present

## 2019-02-18 DIAGNOSIS — I5042 Chronic combined systolic (congestive) and diastolic (congestive) heart failure: Secondary | ICD-10-CM | POA: Diagnosis not present

## 2019-02-18 DIAGNOSIS — K9189 Other postprocedural complications and disorders of digestive system: Secondary | ICD-10-CM | POA: Diagnosis not present

## 2019-02-18 DIAGNOSIS — I739 Peripheral vascular disease, unspecified: Secondary | ICD-10-CM | POA: Diagnosis not present

## 2019-02-18 DIAGNOSIS — I130 Hypertensive heart and chronic kidney disease with heart failure and stage 1 through stage 4 chronic kidney disease, or unspecified chronic kidney disease: Secondary | ICD-10-CM | POA: Diagnosis not present

## 2019-02-23 ENCOUNTER — Telehealth: Payer: Self-pay | Admitting: Family Medicine

## 2019-02-23 NOTE — Telephone Encounter (Signed)
Copied from [place] [phone]. Topic: General - Other >> Feb 22, 2019  9:41 AM Alanda Slim E wrote: Reason for CRM: Pt called and stated that she is worried about her husband (his memory). She wants Cory's nurse to call her before 11:30 so she can talk while hes not around. >> Feb 22, 2019  9:59 AM Cox, Melburn Hake, CMA wrote: Spoke with pt's wife, Opal Sidles [facility]) to advise Cory/Larie Mathes not in office on Mondays. She would like a call back on Tuesday, please.  >> Feb 23, 2019 11:09 AM Miles Costain T, CMA wrote: Damaris Schooner to Mrs. Tiffany Kocher.  She is concerned with the patient's memory.  She states at times the pt is "talking out of his head."  He doesn't know what is going on.  Advised an appt to see [name] and discuss.  Appt scheduled.  Nothing further needed.

## 2019-02-25 ENCOUNTER — Other Ambulatory Visit: Payer: Medicare Other

## 2019-03-01 ENCOUNTER — Telehealth: Payer: Self-pay | Admitting: Cardiology

## 2019-03-01 NOTE — Telephone Encounter (Signed)
New Message         Patient is calling in to see if she can talk with someone about her husband's medication, it's  making him sleepy and he/she wants to know why. Pls advise.

## 2019-03-01 NOTE — Telephone Encounter (Signed)
Pt wife called back she was worried about her husband sleeping to much, pt is home from the nursing home where he had rehab, she thinks that he is on to much medication, pt wife woke pt up and checked his BP it was 152/59 heart rate 64, pt stated that he was not SOB or having chest pains at this time, but he did have chest pain yesterday, he did not take any nitro but he did place a heating pad on his chest, pt was made an appointment for Wed 03/03/2019 at 10:40am, pt and pt wife informed that if he starts to have chest pain again to go to the ER for evaluation,

## 2019-03-02 ENCOUNTER — Ambulatory Visit (INDEPENDENT_AMBULATORY_CARE_PROVIDER_SITE_OTHER): Payer: Medicare Other | Admitting: Adult Health

## 2019-03-02 ENCOUNTER — Encounter: Payer: Self-pay | Admitting: Adult Health

## 2019-03-02 VITALS — BP 158/70 | Temp 97.4°F | Wt 98.0 lb

## 2019-03-02 DIAGNOSIS — I6522 Occlusion and stenosis of left carotid artery: Secondary | ICD-10-CM | POA: Diagnosis not present

## 2019-03-02 DIAGNOSIS — R4189 Other symptoms and signs involving cognitive functions and awareness: Secondary | ICD-10-CM

## 2019-03-02 DIAGNOSIS — R29818 Other symptoms and signs involving the nervous system: Secondary | ICD-10-CM | POA: Diagnosis not present

## 2019-03-02 LAB — CBC WITH DIFFERENTIAL/PLATELET
Basophils Absolute: 0 10*3/uL (ref 0.0–0.1)
Basophils Relative: 0.7 % (ref 0.0–3.0)
Eosinophils Absolute: 0.2 10*3/uL (ref 0.0–0.7)
Eosinophils Relative: 3.2 % (ref 0.0–5.0)
HCT: 29.8 % — ABNORMAL LOW (ref 39.0–52.0)
Hemoglobin: 10.2 g/dL — ABNORMAL LOW (ref 13.0–17.0)
LYMPHS ABS: 1.8 10*3/uL (ref 0.7–4.0)
Lymphocytes Relative: 28.3 % (ref 12.0–46.0)
MCHC: 34.1 g/dL (ref 30.0–36.0)
MCV: 101.6 fl — ABNORMAL HIGH (ref 78.0–100.0)
MONO ABS: 0.6 10*3/uL (ref 0.1–1.0)
Monocytes Relative: 9.3 % (ref 3.0–12.0)
Neutro Abs: 3.8 10*3/uL (ref 1.4–7.7)
Neutrophils Relative %: 58.5 % (ref 43.0–77.0)
Platelets: 111 10*3/uL — ABNORMAL LOW (ref 150.0–400.0)
RBC: 2.94 Mil/uL — ABNORMAL LOW (ref 4.22–5.81)
RDW: 16.5 % — ABNORMAL HIGH (ref 11.5–15.5)
WBC: 6.4 10*3/uL (ref 4.0–10.5)

## 2019-03-02 LAB — POC URINALSYSI DIPSTICK (AUTOMATED)
Bilirubin, UA: NEGATIVE
Blood, UA: NEGATIVE
Glucose, UA: NEGATIVE
KETONES UA: NEGATIVE
Leukocytes, UA: NEGATIVE
Nitrite, UA: NEGATIVE
Protein, UA: NEGATIVE
Spec Grav, UA: 1.025 (ref 1.010–1.025)
Urobilinogen, UA: 0.2 E.U./dL
pH, UA: 6 (ref 5.0–8.0)

## 2019-03-02 LAB — VITAMIN B12: VITAMIN B 12: 460 pg/mL (ref 211–911)

## 2019-03-02 LAB — VITAMIN D 25 HYDROXY (VIT D DEFICIENCY, FRACTURES): VITD: 35.77 ng/mL (ref 30.00–100.00)

## 2019-03-02 LAB — TSH: TSH: 5.6 u[IU]/mL — ABNORMAL HIGH (ref 0.35–4.50)

## 2019-03-02 NOTE — Progress Notes (Signed)
Subjective:    Patient ID: Nicholas Porter, male    DOB: [birth year], 85 y.o.   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  He presents with his wife to this visit for concern of cognitive impairment.  His biggest concern is that of he continues to write checks for a house that has been paid off for multiple years.  The patient believes that he is going to pay off the house in May 2020.  He will write the checks and give them to his wife who will discard the check.  Is not getting lost while driving not driving much due to decreased vision.  He is not forgetting where he put certain items at home and he denies feeling as though he is forgetting people's names that he is close to.  He denies anxiety or depression.  Review of Systems See HPI   Past Medical History:  Diagnosis Date  . Acute metabolic encephalopathy 05/29/2093  . Acute renal failure superimposed on stage 4 chronic kidney disease ([place]) 11/27/2015  . Anemia of chronic renal failure, stage 4 (severe) ([place]) 11/27/2015  . 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 CAD S/P  percutaneous coronary angioplasty - DES in RCA with PTCA for ISR; Moderate mLAD, 80% ostial OM2 stable 05/22/2008   Qualifier: Diagnosis of  By: Linda Hedges MD, Heinz Knuckles   . CKD (chronic kidney disease) stage 3, GFR 30-59 ml/min ([place])   . COPD (chronic obstructive pulmonary disease) ([place])   . Essential hypertension 06/30/2007   Qualifier: Diagnosis of  By: Cori Razor RN, Mikal Plane Eye abnormalities    injections in eyes 12/2017  . H/O Inferior STEMI (09/2013) emergent PCI with Promus DES to RCA (3.0 mm  x 28 mm - 3.2 mm) 10/05/2013  . Klebsiella sepsis ([place]) 04/02/2017  . Left-sided carotid artery disease -- s/p CEA 04/06/2006   S/p Left CEA   . Mitral regurgitation - onexam 09/03/2016    Social History   Socioeconomic History  . Marital status: Married    Spouse name: Not on file  . Number of children: Not on file  . Years of education: Not on file  . Highest education level: Not on file  Occupational History    Employer: RETIRED    Comment: Retired  Scientific laboratory technician  . Financial resource strain: Not on file  . Food insecurity:    Worry: Not on file    Inability: Not on file  . Transportation needs:    Medical: Not on file    Non-medical: Not on file  Tobacco Use  . Smoking status: Former Smoker    Types: Cigarettes    Last attempt to quit: 12/23/1964    Years  since quitting: 54.2  . Smokeless tobacco: Never Used  Substance and Sexual Activity  . Alcohol use: No  . Drug use: No  . Sexual activity: Not on file  Lifestyle  . Physical activity:    Days per week: Not on file    Minutes per session: Not on file  . Stress: Not on file  Relationships  . Social connections:    Talks on phone: Not on file    Gets together: Not on file    Attends religious service: Not on file    Active member of club or organization: Not on file    Attends meetings of clubs or organizations: Not on file    Relationship status: Not on file  . Intimate partner violence:    Fear of current or ex partner:  Not on file    Emotionally abused: Not on file    Physically abused: Not on file    Forced sexual activity: Not on file  Other Topics Concern  . Not on file  Social History Narrative   He is married with 2 children. He lives with his wife.   Very active at home, prior to a STEMI, he could walk several miles without discomfort. Prior to his MI, he would use his push mower to mow his entire lawn.   He is a former smoker and quit back in 1966. He does not drink alcohol    Past Surgical History:  Procedure Laterality Date  . AMPUTATION     left index finger -tramatic  . BILIARY STENT PLACEMENT N/A 01/06/2019   Procedure: BILIARY STENT PLACEMENT;  Surgeon: Clarene Essex, MD;  Location: [facility];  Service: Endoscopy;  Laterality: N/A;  . CARDIAC CATHETERIZATION  November 2011   40% mid LAD, 50% proximal RCA, 30-40% distal RCA (DOMINANT RCA with wraparound PDA & 2 large PLBs), 60-70% ostial OM1. No change from 2001. Medical therapy  . CAROTID ENDARTERECTOMY Left 2007   Dr.Hayes: 08/2016 Dopplers: Stable less than 40% right internal carotid stenosis and stable moderate 40-60% left internal carotid stenosis. Patent vertebral and subclavian arteries.  . CARPAL TUNNEL RELEASE    . CHOLECYSTECTOMY N/A 01/02/2019   Procedure: LAPAROSCOPIC CHOLECYSTECTOMY;  Surgeon: Michael [place], MD;  Location: [facility];  Service: General;  Laterality: N/A;  . CORNEAL TRANSPLANT    . CORONARY ANGIOPLASTY WITH STENT PLACEMENT  09/2013; 05/2014    d) Promus DES 3.0 mm x 28 mm (3.2 mm)  to RCA with STEMI; residual 70% mid-distal LAD, OM 270-80%. Medical management; b) PTCA of 75 % ISR , LAD lesion noted as ~40%  . ERCP N/A 01/06/2019   Procedure: ENDOSCOPIC RETROGRADE CHOLANGIOPANCREATOGRAPHY (ERCP);  Surgeon: Clarene Essex, MD;  Location: [facility];  Service: Endoscopy;  Laterality: N/A;  . FOOT SURGERY     left  . HERNIA REPAIR    . LEFT HEART CATHETERIZATION WITH CORONARY ANGIOGRAM N/A 10/05/2013   Procedure: LEFT  HEART CATHETERIZATION WITH CORONARY ANGIOGRAM;  Surgeon: Peter M Martinique, MD;  Location[facility] CATH LAB;  Service: Cardiovascular;  RCA 100% - PCI,CxOM stable, LAD ~70% mid;  . LEFT HEART CATHETERIZATION WITH CORONARY ANGIOGRAM N/A 06/08/2014   Procedure: LEFT HEART CATHETERIZATION WITH CORONARY ANGIOGRAM;  Surgeon: Troy Sine, MD;  Location: [facility] CATH LAB;  Service: Cardiovascular;  UA 6/'15: 75% ISR in RCA - PTCA, LAD lesion ~40%, CxOM stable.    Marland Kitchen NM MYOVIEW LTD  03/2017    - Elevatred Troponin in setting of Sepsis -- DEMAND ISCHEMIA.  Normal perfusion. LVEF 51% with normal wall motion. This is a low risk study.  Marland Kitchen PARTIAL GASTRECTOMY    . PERCUTANEOUS CORONARY INTERVENTION-BALLOON ONLY  06/08/2014   Procedure: PERCUTANEOUS CORONARY INTERVENTION-BALLOON ONLY;  Surgeon: Troy Sine, MD;  Location: [facility] CATH LAB;  Service: Cardiovascular;;  . ROTATOR CUFF REPAIR     left  . SHOULDER ARTHROSCOPY WITH ROTATOR CUFF REPAIR AND SUBACROMIAL DECOMPRESSION Right 07/01/2013   Procedure: RIGHT SHOULDER ARTHROSCOPY WITH  SUBACROMIAL DECOMPRESSION/DISTAL CLAVICLE RESECTION/ROTATOR CUFF REPAIR ;  Surgeon: Marin Shutter, MD;  Location: [place];  Service: Orthopedics;  Laterality: Right;  . SPHINCTEROTOMY  01/06/2019   Procedure: SPHINCTEROTOMY;  Surgeon: Clarene Essex, MD;  Location: [facility];  Service: Endoscopy;;  . SPINE SURGERY  1985   cervical laminectomy  . TOTAL KNEE ARTHROPLASTY    . TRANSTHORACIC ECHOCARDIOGRAM  09/'17; 4/'18; 1/'20   a) Normal EF 55-60%. Mild to moderate AS with mod AI. Mild MR.;; b) Mild global reduction in LV systolic function: 28-36%; grade 1 DD - high LVEDP. Mild AS/AI/TR. Mild-Mod MR; moderately elevated PAP.;; c) TTE 12/2018: EF 55-60%. No RWMA. Gr1 DD. Mod AS with mild AI. Severe LA dilation.  Marland Kitchen VAGOTOMY     partial gastrectomy    Family History  Problem Relation Age of Onset  . Heart disease Sister        Heart Transplant   . Diabetes Sister     Allergies  Allergen  Reactions  . Amlodipine Besylate Hives  . Hydralazine Itching  . Lipitor [Atorvastatin] Itching  . Naproxen Diarrhea  . Oxycodone-Acetaminophen Itching  . Isosorbide Rash    Current Outpatient Medications on File Prior to Visit  Medication Sig Dispense Refill  . acetaminophen (TYLENOL) 325 MG tablet Take 650 mg by mouth every 6 (six) hours as needed for mild pain or headache.    Marland Kitchen aspirin 81 MG chewable tablet Chew 81 mg by mouth daily.    . brinzolamide (AZOPT) 1 % ophthalmic suspension Place 1 drop into the left eye every 12 (twelve) hours.     . calcium carbonate (OSCAL) 1500 (600 Ca) MG TABS tablet Take 1,500 mg by mouth daily with breakfast.    . CALCIUM PO Take 1 tablet by mouth every other day.    . isosorbide mononitrate (IMDUR) 60 MG 24 hr tablet Take 1 tablet (60 mg total) by mouth daily. 90 tablet 0  . metoprolol tartrate (LOPRESSOR) 25 MG tablet Take 1 tablet (25 mg total) by mouth 2 (two) times daily. 180 tablet 0  . Multiple Vitamin (MULTIVITAMIN WITH MINERALS) TABS tablet Take 1 tablet by mouth daily.    . nitroGLYCERIN (NITROSTAT) 0.4 MG SL tablet DISSOLVE 1 TABLET UNDER THE TONGUE EVERY 5 MINUTES AS NEEDED FOR CHEST PAIN (Patient not taking: Reported on 03/03/2019) 25 tablet 12  . TRAVATAN Z 0.004 % SOLN ophthalmic solution Place 1 drop into both eyes at bedtime.      No current facility-administered medications on file prior to visit.     BP (!) 158/70   Temp (!) 97.4 F (36.3 C)   Wt 98 lb (44.5 kg)   BMI 15.35 kg/m       Objective:   Physical Exam Vitals signs and nursing note reviewed.  Constitutional:      Appearance: Normal appearance.  Cardiovascular:     Rate and Rhythm: Normal rate and regular rhythm.     Pulses: Normal pulses.     Heart sounds: Normal heart sounds.  Pulmonary:  Effort: Pulmonary effort is normal.     Breath sounds: Normal breath sounds.  Musculoskeletal: Normal range of motion.  Skin:    General: Skin is warm and dry.    Neurological:     General: No focal deficit present.     Mental Status: He is alert and oriented to person, place, and time.  Psychiatric:        Attention and Perception: Attention and perception normal.        Mood and Affect: Mood normal.        Speech: Speech normal.        Behavior: Behavior normal.        Thought Content: Thought content normal.        Cognition and Memory: Cognition and memory normal.        Judgment: Judgment normal.       Assessment & Plan:   1. Cognitive impairment -MMSE 25/30 -unable to perform delayed recall.  -No real apparent signs of dementia.  Will check some lab work and get an MRI of the brain.  Likely age-related cognitive decline.  Encourage brain games such as crosswords puzzles. - CBC with Differential/Platelet; Future - Vitamin D, 25-hydroxy - Vitamin B12 - TSH - MR Brain Wo Contrast; Future - POCT Urinalysis Dipstick (Automated) - CBC with Differential/Platelet  Dorothyann Peng, NP

## 2019-03-03 ENCOUNTER — Other Ambulatory Visit: Payer: Self-pay | Admitting: Adult Health

## 2019-03-03 ENCOUNTER — Other Ambulatory Visit: Payer: Self-pay

## 2019-03-03 ENCOUNTER — Ambulatory Visit (INDEPENDENT_AMBULATORY_CARE_PROVIDER_SITE_OTHER): Payer: Medicare Other | Admitting: Cardiology

## 2019-03-03 ENCOUNTER — Telehealth: Payer: Self-pay | Admitting: Adult Health

## 2019-03-03 ENCOUNTER — Telehealth: Payer: Self-pay | Admitting: Cardiology

## 2019-03-03 VITALS — BP 138/58 | HR 60 | Ht 67.0 in | Wt 99.2 lb

## 2019-03-03 DIAGNOSIS — I2510 Atherosclerotic heart disease of native coronary artery without angina pectoris: Secondary | ICD-10-CM

## 2019-03-03 DIAGNOSIS — E43 Unspecified severe protein-calorie malnutrition: Secondary | ICD-10-CM | POA: Diagnosis not present

## 2019-03-03 DIAGNOSIS — I350 Nonrheumatic aortic (valve) stenosis: Secondary | ICD-10-CM

## 2019-03-03 DIAGNOSIS — Z9861 Coronary angioplasty status: Secondary | ICD-10-CM | POA: Diagnosis not present

## 2019-03-03 DIAGNOSIS — I6522 Occlusion and stenosis of left carotid artery: Secondary | ICD-10-CM

## 2019-03-03 DIAGNOSIS — I951 Orthostatic hypotension: Secondary | ICD-10-CM

## 2019-03-03 DIAGNOSIS — I10 Essential (primary) hypertension: Secondary | ICD-10-CM | POA: Diagnosis not present

## 2019-03-03 MED ORDER — LEVOTHYROXINE SODIUM 25 MCG PO TABS: 25.0000 ug | ORAL_TABLET | Freq: Every day | ORAL | 1 refills | Status: DC

## 2019-03-03 MED ORDER — HYDRALAZINE HCL 50 MG PO TABS: 25.0000 mg | ORAL_TABLET | Freq: Two times a day (BID) | ORAL | 0 refills | Status: DC

## 2019-03-03 NOTE — Patient Instructions (Signed)
Medication Instructions:   CONTINUE TAKING HYDRALAZINE AS TWICE A DAY UNTIL YOU SEE GI DOCTOR NEXT WEEK  AFTER YOU HAVE YOUR PROCEDURE DECREASE HYDRALAZINE TO 1/2 TABLET TWICE A DAY.   THE DAY OF YOUR PROCEDURE TAKE HYDRALAZINE  BEFORE YOU LEAVE YOUR HOUSE THAT MORNING.  EXCEPT  THE DAY YOU SEE DR MAGOD TAKE HYDRALAZINE THREE TIMES A DAY THE DAY BEFORE AND THE DAY OF    If you need a refill on your cardiac medications before your next appointment, please call your pharmacy.   Lab work: Not needed If you have labs (blood work) drawn today and your tests are completely normal, you will receive your results only by: Marland Kitchen MyChart Message (if you have MyChart) OR . A paper copy in the mail If you have any lab test that is abnormal or we need to change your treatment, we will call you to review the results.  Testing/Procedures: Not needed  Follow-Up: At [facility], you and your health needs are our priority.  As part of our continuing mission to provide you with exceptional heart care, we have created designated Provider Care Teams.  These Care Teams include your primary Cardiologist (physician) and Advanced Practice Providers (APPs -  Physician Assistants and Nurse Practitioners) who all work together to provide you with the care you need, when you need it. You will need a follow up appointment in 4 months July 2020.  Please call our office 2 months in advance to schedule this appointment.  You may see Glenetta Hew, MD or one of the following Advanced Practice Providers on your designated Care Team:   Rosaria Ferries, PA-C . Jory Sims, DNP, ANP  Any Other Special Instructions Will Be Listed Below (If Applicable).

## 2019-03-03 NOTE — Progress Notes (Signed)
PCP: Nicholas Peng, NP  Clinic Note: Chief Complaint  Patient presents with  . Follow-up    No cardiac complaints.  Gradually regaining strength  . Coronary Artery Disease    No angina    HPI: Nicholas Porter is a 85 y.o. male with a PMH of CAD-PCI & essentially ~Failure to Thrive, who presents for short f/u after initial post-hospital f/u from complicated acute cholecystitis.   .  CVD History:  H/O Inferior STEMI (09/2013) emergent PCI with Promus DES to RCA (3.0 mm x 28 mm - 3.2 mm)  Unstable Angina 05/2014: ISR - cutting balloon PTCA ; Ostial OM2 80% (stable) -  Most recent Echo and Myoview April 2018: EF 51%.  Low risk.  Echo with EF 45 to 50%.  GR 1 DD.  Mild AI and TR.  Mild to moderate MR.  L Carotid Dz - s/p CEA 03/2014   Nicholas Porter was last seen in Feb 2020 -- following hospitalization for Acute Gangrenous Cholecystitis - Lap Chole (ERCP).  Delayed cystic duct stump bile leak s/p ERCP/stent 01/06/2019. Noted to be hypertensive, so started on Hydralazine in addition to Imdur -- had not been treating HTN priot to this b/c hypotensive episodes -- allowing permissive HTN.  No change made to allow for d/c from rehab without HTN issue. -- short term @[facility] for rehab. -- Was doing well from a CV standpoint.  Hoping for d/c from Rehab. -- still not interested in restarting statin. Not on ASA/Plavix  Recent Hospitalizations:   n/a  Studies Personally Reviewed - (if available, images/films reviewed: From Epic Chart or Care Everywhere)  none  Interval History: Nicholas Porter returns here today for close f/u after d/c from rehab.   He has an upcoming procedure (ERCP to remove biliary stent).  But otherwise is doing quite well.  He is happy to be discharged from [facility].  He says he is eating more the food just does not taste right.  But he started to increase his overall appetite. Nosebleeds also seem to be doing better. He says that he is nitroglycerin twice since I  last saw him, but this was for relatively atypical sounding symptoms.  He still has poor balance and has to walk further distances with a walker or cane.  Not yet back into his full level of activity.  Has not had to use nitroglycerin for exertional chest discomfort.  No PND orthopnea edema.  No rapid heartbeats palpitations.  No syncope near syncope no TIA or amaurosis fugax.  Other than occasional nosebleeds, no further bleeding.  He is now able to do his activities of daily living without assistance and getting somewhat stronger.  Still is having a hard time gaining weight  1 of the more concerning things for him though is that he is now starting to lose his weight from macular degeneration and is not much they can do about it.  This is limiting his ability to get around much.  No claudication.  ROS: A comprehensive was performed. Review of Systems  Constitutional: Positive for malaise/fatigue (Still weak, but regaining strength). Negative for weight loss (Just not gaining).  HENT: Positive for nosebleeds (Less frequent now.). Negative for congestion.   Eyes:       Starting to slowly lose his vision.  Respiratory: Negative for cough, shortness of breath and wheezing.   Gastrointestinal: Positive for abdominal pain (Postop abdominal pain is gradually getting better). Negative for blood in stool, constipation, diarrhea, heartburn and melena.  Genitourinary: Negative  for hematuria.  Musculoskeletal: Negative for falls and joint pain.  Neurological: Positive for dizziness (Sometimes with positional change), tingling ( in his hands and feet sometimes) and weakness (Generalized; legs have a hard time holding him up.). Negative for focal weakness.       Unsteady gait.  Now having to walk with a cane  Psychiatric/Behavioral: Positive for hallucinations (There little bit worried because since the hospital stay and in the rehab facility, he has been having episodes of seeing things that are not  there, no auditory hallucinations.) and memory loss.  All other systems reviewed and are negative.   I have reviewed and (if needed) personally updated the patient's problem list, medications, allergies, past medical and surgical history, social and family history.   Past Medical History:  Diagnosis Date  . Acute metabolic encephalopathy 01/23/9757  . Acute renal failure superimposed on stage 4 chronic kidney disease (Nicholas Porter) 11/27/2015  . Anemia of chronic renal failure, stage 4 (severe) (Nicholas Porter) 11/27/2015  . 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 CAD S/P percutaneous coronary angioplasty - DES in RCA with PTCA for ISR; Moderate mLAD, 80% ostial OM2 stable 05/22/2008   Qualifier: Diagnosis of  By: Nicholas Hedges MD, Nicholas Porter   . CKD (chronic kidney disease) stage 3, GFR 30-59 ml/min ([place])   . COPD (chronic obstructive pulmonary disease) (Nicholas Porter)   . Essential hypertension 06/30/2007   Qualifier: Diagnosis of  By: Nicholas Razor RN, Nicholas Porter    injections in eyes 12/2017  . H/O Inferior STEMI (09/2013) emergent PCI with Promus DES to RCA (3.0 mm  x 28 mm - 3.2 mm) 10/05/2013  . Klebsiella sepsis (Nicholas Porter) 04/02/2017  . Left-sided carotid artery disease -- s/p CEA 04/06/2006   S/p Left CEA   . Mitral regurgitation - onexam 09/03/2016    Past Surgical History:  Procedure Laterality Date  . AMPUTATION     left index finger -tramatic  . BILIARY STENT PLACEMENT N/A 01/06/2019   Procedure: BILIARY STENT PLACEMENT;  Surgeon: Nicholas Essex, MD;  Location: [facility];  Service: Endoscopy;  Laterality: N/A;  . CARDIAC CATHETERIZATION  November 2011   40% mid LAD, 50% proximal RCA, 30-40% distal RCA (DOMINANT RCA with wraparound PDA & 2 large PLBs), 60-70% ostial OM1. No change from 2001. Medical therapy  . CAROTID ENDARTERECTOMY Left 2007   Nicholas Porter: 08/2016 Dopplers: Stable less than 40% right internal carotid stenosis  and stable moderate 40-60% left internal carotid stenosis. Patent vertebral and subclavian arteries.  . CARPAL TUNNEL RELEASE    . CHOLECYSTECTOMY N/A 01/02/2019   Procedure: LAPAROSCOPIC CHOLECYSTECTOMY;  Surgeon: Michael Boston, MD;  Location: [facility];  Service: General;  Laterality: N/A;  . CORNEAL TRANSPLANT    . CORONARY ANGIOPLASTY WITH STENT PLACEMENT  09/2013; 05/2014    d) Promus DES 3.0 mm x 28 mm (3.2 mm)  to RCA with STEMI; residual 70% mid-distal LAD, OM 270-80%. Medical management; b) PTCA of 75 % ISR , LAD lesion noted as ~40%  . ERCP N/A 01/06/2019   Procedure: ENDOSCOPIC RETROGRADE CHOLANGIOPANCREATOGRAPHY (ERCP);  Surgeon: Nicholas Essex, MD;  Location: [facility];  Service: Endoscopy;  Laterality: N/A;  . FOOT SURGERY     left  . HERNIA REPAIR    . LEFT HEART CATHETERIZATION WITH CORONARY ANGIOGRAM N/A 10/05/2013   Procedure: LEFT HEART CATHETERIZATION WITH CORONARY ANGIOGRAM;  Surgeon: Peter M Martinique, MD;  Location:  [facility];  Service: Cardiovascular;  RCA 100% - PCI,CxOM stable, LAD ~70% mid;  . LEFT HEART CATHETERIZATION WITH CORONARY ANGIOGRAM N/A 06/08/2014   Procedure: LEFT HEART CATHETERIZATION WITH CORONARY ANGIOGRAM;  Surgeon: Troy Sine, MD;  Location: [facility] LAB;  Service: Cardiovascular;  UA 6/'15: 75% ISR in RCA - PTCA, LAD lesion ~40%, CxOM stable.    Marland Kitchen NM MYOVIEW LTD  03/2017    - Elevatred Troponin in setting of Sepsis -- DEMAND ISCHEMIA.  Normal perfusion. LVEF 51% with normal wall motion. This is a low risk study.  Marland Kitchen PARTIAL GASTRECTOMY    . PERCUTANEOUS CORONARY INTERVENTION-BALLOON ONLY  06/08/2014   Procedure: PERCUTANEOUS CORONARY INTERVENTION-BALLOON ONLY;  Surgeon: Troy Sine, MD;  Location: [facility] C[facility] LAB;  Service: Cardiovascular;;  . ROTATOR CUFF REPAIR     left  . SHOULDER ARTHROSCOPY WITH ROTATOR CUFF REPAIR AND SUBACROMIAL DECOMPRESSION Right 07/01/2013   Procedure: RIGHT SHOULDER ARTHROSCOPY WITH  SUBACROMIAL DECOMPRESSION/DISTAL CLAVICLE  RESECTION/ROTATOR CUFF REPAIR ;  Surgeon: Marin Shutter, MD;  Location: [facility];  Service: Orthopedics;  Laterality: Right;  . SPHINCTEROTOMY  01/06/2019   Procedure: SPHINCTEROTOMY;  Surgeon: Nicholas Essex, MD;  Location: [facility];  Service: Endoscopy;;  . SPINE SURGERY  1985   cervical laminectomy  . TOTAL KNEE ARTHROPLASTY    . TRANSTHORACIC ECHOCARDIOGRAM  09/'17; 4/'18; 1/'20   a) Normal EF 55-60%. Mild to moderate AS with mod AI. Mild MR.;; b) Mild global reduction in LV systolic function: 53-61%; grade 1 DD - high LVEDP. Mild AS/AI/TR. Mild-Mod MR; moderately elevated PAP.;; c) TTE 12/2018: EF 55-60%. No RWMA. Gr1 DD. Mod AS with mild AI. Severe LA dilation.  Marland Kitchen VAGOTOMY     partial gastrectomy    Current Meds  Medication Sig  . acetaminophen (TYLENOL) 325 MG tablet Take 650 mg by mouth every 6 (six) hours as needed for mild pain or headache.  . brinzolamide (AZOPT) 1 % ophthalmic suspension Place 1 drop into the left eye every 12 (twelve) hours.   . calcium carbonate (OSCAL) 1500 (600 Ca) MG TABS tablet Take 1,500 mg by mouth daily with breakfast.  . CALCIUM PO Take 1 tablet by mouth every other day.  . hydrALAZINE (APRESOLINE) 50 MG tablet Take 0.5 tablets (25 mg total) by mouth 2 (two) times daily.  . isosorbide mononitrate (IMDUR) 60 MG 24 hr tablet Take 1 tablet (60 mg total) by mouth daily.  . metoprolol tartrate (LOPRESSOR) 25 MG tablet Take 1 tablet (25 mg total) by mouth 2 (two) times daily.  . Multiple Vitamin (MULTIVITAMIN WITH MINERALS) TABS tablet Take 1 tablet by mouth daily.  . TRAVATAN Z 0.004 % SOLN ophthalmic solution Place 1 drop into both eyes at bedtime.   . [DISCONTINUED] hydrALAZINE (APRESOLINE) 50 MG tablet Take 1 tablet (50 mg total) by mouth 3 (three) times daily.    Allergies  Allergen Reactions  . Amlodipine Besylate Hives  . Hydralazine Itching  . Lipitor [Atorvastatin] Itching  . Naproxen Diarrhea  . Oxycodone-Acetaminophen Itching  . Isosorbide  Rash    Social History   Tobacco Use  . Smoking status: Former Smoker    Types: Cigarettes    Last attempt to quit: 12/23/1964    Years since quitting: 54.2  . Smokeless tobacco: Never Used  Substance Use Topics  . Alcohol use: No  . Drug use: No   Social History   Social History Narrative   He is married with 2 children. He lives with  his wife.   Very active at home, prior to a STEMI, he could walk several miles without discomfort. Prior to his MI, he would use his push mower to mow his entire lawn.   He is a former smoker and quit back in 1966. He does not drink alcohol      CODE STATUS: DO NOT RESUSCITATE    family history includes Diabetes in his sister; Heart disease in his sister.  Wt Readings from Last 3 Encounters:  03/03/19 99 lb 3.2 oz (45 kg)  03/02/19 98 lb (44.5 kg)  02/09/19 100 lb (45.4 kg)   PHYSICAL EXAM BP (!) 138/58   Pulse 60   Ht 5\' 7"  (1.702 m)   Wt 99 lb 3.2 oz (45 kg)   SpO2 97%   BMI 15.54 kg/m  Physical Exam  Constitutional: He is oriented to person, place, and time. No distress.  Still thin, frail borderline cachectic appearing --although nontoxic, he is somewhat chronically ill appearing.  However his weight is stable now.  HENT:  Head: Normocephalic and atraumatic.  Temporal wasting  Neck: Normal range of motion. Neck supple. No hepatojugular reflux and no JVD present. Carotid bruit is not present.  Cardiovascular: Normal rate and regular rhythm.  No extrasystoles are present. PMI is not displaced (Hyperdynamic precordium, likely because of minimal muscle mass.  PMI nondisplaced, but is sustained.). Exam reveals no gallop, no friction rub and no decreased pulses.  Murmur heard.  Medium-pitched harsh crescendo-decrescendo mid to late systolic murmur is present with a grade of 3/6 at the upper right sternal border radiating to the neck. High-pitched blowing decrescendo early diastolic murmur is present with a grade of 1/6 at the upper right  sternal border radiating to the apex. Pulmonary/Chest: Effort normal and breath sounds normal. No respiratory distress. He has no wheezes. He has no rales.  Abdominal: Soft. Bowel sounds are normal. He exhibits no distension.  Pretty much scaphoid abdomen.  He has his biliary drain coming out of the midline.  No right upper quadrant tenderness.  Musculoskeletal: Normal range of motion.        General: No edema.  Neurological: He is alert and oriented to person, place, and time.  Psychiatric: He has a normal mood and affect. His behavior is normal. Judgment and thought content normal.  Quite jovial.  In good spirits  Vitals reviewed.    Adult ECG Report Sinus rhythm, rate 60 bpm.  Moderate LVH with repolarization changes.  Hyperacute T waves noted with nonspecific ST-T wave changes.  Cannot exclude anterior MI, age undetermined. --Stable EKG.  Other studies Reviewed: Additional studies/ records that were reviewed today include:  Recent Labs:   Lab Results  Component Value Date   CREATININE 1.87 (H) 02/09/2019   BUN 47 (H) 02/09/2019   NA 139 02/09/2019   K 5.1 02/09/2019   CL 110 02/09/2019   CO2 20 02/09/2019   Lab Results  Component Value Date   CHOL 122 07/20/2015   HDL 37.90 (L) 07/20/2015   LDLCALC 63 07/20/2015   TRIG 106.0 07/20/2015   CHOLHDL 3 07/20/2015   CBC Latest Ref Rng & Units 03/02/2019 02/09/2019 01/10/2019  WBC 4.0 - 10.5 K/uL 6.4 5.7 9.3  Hemoglobin 13.0 - 17.0 g/dL 10.2(L) 9.4(L) 10.2(L)  Hematocrit 39.0 - 52.0 % 29.8(L) 27.8(L) 31.5(L)  Platelets 150.0 - 400.0 K/uL 111.0(L) 119.0(L) 147(L)    ASSESSMENT / PLAN: Problem List Items Addressed This Visit    CAD S/P percutaneous coronary angioplasty -  DES in RCA with PTCA for ISR; Moderate mLAD, 80% ostial OM2 stable - Primary (Chronic)    Doing relatively well.  Because he is having to use a little nitroglycerin, will continue his current dose Imdur. He is currently on metoprolol.  Could consider converting  to carvedilol if his blood pressure is still stable at next visit.  Is on aspirin without antiplatelet agent because of nosebleeds and distant PCI. Per his choice not back on statin, was having myalgias.  At this point, we are not to do any further evaluation with any stress test etc. unless he has recurrent symptoms.        Relevant Medications   hydrALAZINE (APRESOLINE) 50 MG tablet   Other Relevant Orders   EKG 12-Lead (Completed)   Essential hypertension (Chronic)    Has had issues with high high blood pressure which is pretty well controlled currently.  As result I will simply maintain his is relatively similar.  I will have him switch eventually to hydralazine twice daily and then reduce to 1/2 tablet twice daily.  This will hopefully allow little progress hypertension.  He could then potentially convert him to carvedilol next visit.   Have detailed instructions for how to manage his hydralazine noted in his patient instructions.      Relevant Medications   hydrALAZINE (APRESOLINE) 50 MG tablet   Other Relevant Orders   EKG 12-Lead (Completed)   Moderate aortic stenosis by prior echocardiogram (Chronic)    At this point, we are holding off on any further studies.  If murmur worsens, or if he has more symptoms, would consider checking, but I do not think you would be interested in TAVR.  Therefore I do think it is reasonable not to continue to follow.      Relevant Medications   hydrALAZINE (APRESOLINE) 50 MG tablet   Orthostatic hypotension    Because a history of orthostatic hypotension, we are allowing for mild permissive hypertension --> he had tight blood pressure control in the hospital, but we really do not want that now.      Relevant Medications   hydrALAZINE (APRESOLINE) 50 MG tablet   Protein-calorie malnutrition, severe    I premeds told him that he should eat whatever he wants to eat.  We talked about eating ice cream, milk shakes.  [employer].   He needs protein and fat.         I spent a total of ~72minutes with the patient and chart review. >  50% of the time was spent in direct patient consultation.  We reviewed his post hospital discharge as well as his rehab.  Current medicines are reviewed at length with the patient today.  (+/- concerns) -aspirin no aspirin.  Question of hypertension medicines.  The following changes have been made:    Patient Instructions  Medication Instructions:   CONTINUE TAKING HYDRALAZINE AS TWICE A DAY UNTIL YOU SEE GI DOCTOR NEXT WEEK  AFTER YOU HAVE YOUR PROCEDURE DECREASE HYDRALAZINE TO 1/2 TABLET TWICE A DAY.   THE DAY OF YOUR PROCEDURE TAKE HYDRALAZINE  BEFORE YOU LEAVE YOUR HOUSE THAT MORNING.  EXCEPT  THE DAY YOU SEE DR MAGOD TAKE HYDRALAZINE THREE TIMES A DAY THE DAY BEFORE AND THE DAY OF    If you need a refill on your cardiac medications before your next appointment, please call your pharmacy.   Lab work: Not needed If you have labs (blood work) drawn today and your tests are completely normal, you  will receive your results only by: Marland Kitchen MyChart Message (if you have MyChart) OR . A paper copy in the mail If you have any lab test that is abnormal or we need to change your treatment, we will call you to review the results.  Testing/Procedures: Not needed  Follow-Up: At [facility], you and your health needs are our priority.  As part of our continuing mission to provide you with exceptional heart care, we have created designated Provider Care Teams.  These Care Teams include your primary Cardiologist (physician) and Advanced Practice Providers (APPs -  Physician Assistants and Nurse Practitioners) who all work together to provide you with the care you need, when you need it. You will need a follow up appointment in 4 months July 2020.  Please call our office 2 months in advance to schedule this appointment.  You may see Glenetta Hew, MD or one of the following Advanced Practice  Providers on your designated Care Team:   Rosaria Ferries, PA-C . Jory Sims, DNP, ANP  Any Other Special Instructions Will Be Listed Below (If Applicable).     Studies Ordered:   Orders Placed This Encounter  Procedures  . EKG 12-Lead      Glenetta Hew, M.D., M.S. Interventional Cardiologist   Pager # [phone] Phone # [phone] [address] [place], [place] [address]   Thank you for choosing Heartcare at [facility]!!

## 2019-03-03 NOTE — Telephone Encounter (Signed)
New Message:    Pt just saw Dr Ellyn Hack today. Wife have a question about his medicine is supposed to take on the day of his procedure.

## 2019-03-03 NOTE — Telephone Encounter (Signed)
Discussed medication instructions with wife. Patient wife verbalized understanding.

## 2019-03-03 NOTE — Telephone Encounter (Signed)
Updated the patient's wife on his lab results.  TSH was elevated, due to symptoms he is experiencing we will try him on low-dose of Synthroid.

## 2019-03-05 NOTE — Telephone Encounter (Signed)
Pt had lab work done and TSH is abnormal.  Please advise.

## 2019-03-05 NOTE — Telephone Encounter (Signed)
I just prescribed this.   Please have him follow up with me for an office visit in a month to see how he is doing

## 2019-03-06 ENCOUNTER — Encounter: Payer: Self-pay | Admitting: Cardiology

## 2019-03-06 NOTE — Assessment & Plan Note (Signed)
Because a history of orthostatic hypotension, we are allowing for mild permissive hypertension --> he had tight blood pressure control in the hospital, but we really do not want that now.

## 2019-03-06 NOTE — Assessment & Plan Note (Signed)
Has had issues with high high blood pressure which is pretty well controlled currently.  As result I will simply maintain his is relatively similar.  I will have him switch eventually to hydralazine twice daily and then reduce to 1/2 tablet twice daily.  This will hopefully allow little progress hypertension.  He could then potentially convert him to carvedilol next visit.   Have detailed instructions for how to manage his hydralazine noted in his patient instructions.

## 2019-03-06 NOTE — Assessment & Plan Note (Signed)
At this point, we are holding off on any further studies.  If murmur worsens, or if he has more symptoms, would consider checking, but I do not think you would be interested in TAVR.  Therefore I do think it is reasonable not to continue to follow.

## 2019-03-06 NOTE — Assessment & Plan Note (Signed)
Doing relatively well.  Because he is having to use a little nitroglycerin, will continue his current dose Imdur. He is currently on metoprolol.  Could consider converting to carvedilol if his blood pressure is still stable at next visit.  Is on aspirin without antiplatelet agent because of nosebleeds and distant PCI. Per his choice not back on statin, was having myalgias.  At this point, we are not to do any further evaluation with any stress test etc. unless he has recurrent symptoms.

## 2019-03-06 NOTE — Assessment & Plan Note (Signed)
I premeds told him that he should eat whatever he wants to eat.  We talked about eating ice cream, milk shakes.  [employer].  He needs protein and fat.

## 2019-03-09 ENCOUNTER — Telehealth: Payer: Self-pay | Admitting: Adult Health

## 2019-03-09 NOTE — Telephone Encounter (Signed)
I called pt wife Opal Sidles [phone]  Left msg to inform that the order was resent to [facility] for scheduling and the patient may call to schedule at his  convince   Copied from [place] [phone]. Topic: Referral - Status >> Mar 08, 2019 10:17 AM Reyne Dumas L wrote: Reason for CRM:   Pt's wife, Opal Sidles, calling:  states that she hasn't heard anything about pt being schedule for MRI or CT scan (pt's wife was unsure which it was supposed to be). Opal Sidles can be reached at [phone]

## 2019-03-10 ENCOUNTER — Ambulatory Visit: Payer: Medicare Other | Admitting: Cardiology

## 2019-03-29 ENCOUNTER — Telehealth: Payer: Self-pay | Admitting: Cardiology

## 2019-03-29 NOTE — Telephone Encounter (Signed)
Per wife, patient has been sleeping a lot and fatigue. Wife requesting if medications can be adjusted. Has not been checking BP or HR regularly BP checked last Thursday BP was 168/? Unsure of diastolic number or heart rate Request wife to check BP and HR while on phone BP 166/71 & HR 83. Reports eating and drinking well Denies, chest pain, dizziness, sob, n/v, fever, chills.  Medications reconciled.

## 2019-03-29 NOTE — Telephone Encounter (Signed)
° ° °  Patient's spouse is calling to request medications be changed due patient staying tired. No other symptoms  Please call

## 2019-03-30 MED ORDER — METOPROLOL TARTRATE 25 MG PO TABS: 25.0000 mg | ORAL_TABLET | Freq: Every day | ORAL | 0 refills | Status: DC

## 2019-03-30 NOTE — Telephone Encounter (Signed)
Lets have him stop the Metoprolol. Increase Hydralazine to full tab 2 x daily.  Glenetta Hew, MD

## 2019-03-30 NOTE — Telephone Encounter (Signed)
Patient's wife informed and verbalized understanding of plan. 

## 2019-03-30 NOTE — Telephone Encounter (Signed)
Wife informed and verbalized understanding of plan. 

## 2019-03-30 NOTE — Telephone Encounter (Signed)
OK - then continue Hydralazine as written. To stop Metoprolol take once daily x 6 days & stop.

## 2019-03-30 NOTE — Addendum Note (Signed)
Addended by: Merlene Laughter on: 03/30/2019 04:40 PM   Modules accepted: Orders

## 2019-04-06 ENCOUNTER — Other Ambulatory Visit: Payer: Self-pay

## 2019-04-06 ENCOUNTER — Ambulatory Visit
Admission: RE | Admit: 2019-04-06 | Discharge: 2019-04-06 | Disposition: A | Discharge status: Home / Self Care | Payer: Medicare Other | Source: Ambulatory Visit | Attending: Adult Health | Admitting: Adult Health

## 2019-04-06 DIAGNOSIS — R4189 Other symptoms and signs involving cognitive functions and awareness: Secondary | ICD-10-CM

## 2019-04-14 ENCOUNTER — Telehealth: Payer: Self-pay | Admitting: Adult Health

## 2019-04-14 DIAGNOSIS — F0390 Unspecified dementia without behavioral disturbance: Secondary | ICD-10-CM

## 2019-04-14 DIAGNOSIS — F03A0 Unspecified dementia, mild, without behavioral disturbance, psychotic disturbance, mood disturbance, and anxiety: Secondary | ICD-10-CM

## 2019-04-14 MED ORDER — DONEPEZIL HCL 5 MG PO TABS: 5.0000 mg | ORAL_TABLET | Freq: Every day | ORAL | 1 refills | Status: DC

## 2019-04-14 NOTE — Telephone Encounter (Signed)
Left Vm to call back regarding brain MRI

## 2019-04-14 NOTE — Telephone Encounter (Signed)
Updated patient and his wife on his MRI of brain which shows  Advanced volume loss and findings of chronic ischemic microangiopathy. No acute abnormality.  We discussed these findings and that his cognitive impairment is likely the beginning of dementia.   I advised seeing Neurology to which he refused. He is ok with trying Aricept at this time.   All questions answered to the best of my ability

## 2019-04-26 ENCOUNTER — Telehealth: Payer: Self-pay | Admitting: Cardiology

## 2019-04-26 NOTE — Telephone Encounter (Signed)
Lm to call back ./cy 

## 2019-04-26 NOTE — Telephone Encounter (Signed)
New Message   Patient wants Dr. Ellyn Hack to know he had the MRI done and it shows he has Alzheimers and he's taking medication to slow it down.  Please call patient back to talk about the issue further.

## 2019-04-26 NOTE — Telephone Encounter (Signed)
Pt's wife called and states that she needs to speak with Dorothyann Peng again regarding results of MRI.

## 2019-04-27 ENCOUNTER — Other Ambulatory Visit: Payer: Self-pay | Admitting: Adult Health

## 2019-04-27 ENCOUNTER — Ambulatory Visit: Payer: Medicare Other | Admitting: Cardiology

## 2019-04-27 DIAGNOSIS — E039 Hypothyroidism, unspecified: Secondary | ICD-10-CM

## 2019-04-27 DIAGNOSIS — R4189 Other symptoms and signs involving cognitive functions and awareness: Secondary | ICD-10-CM

## 2019-04-27 MED ORDER — LEVOTHYROXINE SODIUM 25 MCG PO TABS: 25.0000 ug | ORAL_TABLET | Freq: Every day | ORAL | 1 refills | Status: DC

## 2019-04-27 NOTE — Progress Notes (Signed)
Spoke to patient and his wife and he has changed his mind about seeing a Neurologist due to cognitive impairment - he would like to now be referred to one.   Also needs a refill of Synthroid

## 2019-04-28 NOTE — Telephone Encounter (Signed)
Pt's wife aware of Dr Allison Quarry response .Adonis Housekeeper

## 2019-04-28 NOTE — Telephone Encounter (Signed)
Pt wanted to let Dr Ellyn Hack know that he had MRI done and shows beginning stages of dementia and pt was started on Donepezil 5 mg qhs . Per wife also notes pt continues to sleep a lot even after Metoprolol was stopped Will forward to Dr Ellyn Hack .Adonis Housekeeper

## 2019-04-28 NOTE — Telephone Encounter (Signed)
Nicholas Porter states she called the office 2 days ago to speak with Dr. Ellyn Hack or his nurse and has not gotten a response.  I researched the call and told her the triage nurse called and left a message for her to call back.  The patient stated she does not have voice mail and we should call her phone until she answers.

## 2019-04-28 NOTE — Telephone Encounter (Signed)
Unfortunately, I think the sleepiness is just part of his age.  He goes along with the dementia finding.  Really do not have much more room to take medicines off at this point.  Glenetta Hew, MD

## 2019-05-10 ENCOUNTER — Telehealth: Payer: Self-pay | Admitting: Adult Health

## 2019-05-10 NOTE — Telephone Encounter (Signed)
Copied from [place] [phone]. Topic: Quick Communication - See Telephone Encounter >> May 10, 2019  3:39 PM Nils Flack wrote: CRM for notification. See Telephone encounter for: 05/10/19. Pt would like to have a call back about what meds pt is and is not suppose to be taking.  Cb is [phone]

## 2019-05-11 NOTE — Telephone Encounter (Signed)
Spoke to the pt's wife.  We went over the medication list.  Pt has stopped taking isosorbide mononitrate (IMDUR) 60 MG 24 hr tablet. Would like to know if he should continue.  Would also like to know if he needs to continue the Cerovite that was started in rehab.  Please advise.

## 2019-05-12 ENCOUNTER — Other Ambulatory Visit: Payer: Self-pay

## 2019-05-12 ENCOUNTER — Other Ambulatory Visit: Payer: Self-pay | Admitting: Gastroenterology

## 2019-05-12 ENCOUNTER — Telehealth: Payer: Self-pay | Admitting: *Deleted

## 2019-05-12 ENCOUNTER — Ambulatory Visit
Admission: RE | Admit: 2019-05-12 | Discharge: 2019-05-12 | Disposition: A | Discharge status: Home / Self Care | Payer: Medicare Other | Source: Ambulatory Visit | Attending: Gastroenterology | Admitting: Gastroenterology

## 2019-05-12 ENCOUNTER — Telehealth: Payer: Self-pay

## 2019-05-12 DIAGNOSIS — R6889 Other general symptoms and signs: Secondary | ICD-10-CM | POA: Diagnosis not present

## 2019-05-12 MED ORDER — ISOSORBIDE MONONITRATE ER 60 MG PO TB24: 60.0000 mg | ORAL_TABLET | Freq: Every day | ORAL | 0 refills | Status: DC

## 2019-05-12 NOTE — Telephone Encounter (Signed)
Pt understands that although there may be some limitations with this type of visit, we will take all precautions to reduce any security or privacy concerns.  Pt understands that this will be treated like an in office visit and we will file with pt's insurance, and there may be a patient responsible charge related to this service. Pt's pt was confirmed appt with Dr. Jannifer Franklin and helped scheduled.   Tammy will assist the pt with video visit.   EMR was updated for 05/13/19 video visit.

## 2019-05-12 NOTE — Telephone Encounter (Signed)
Noted. See other message

## 2019-05-12 NOTE — Telephone Encounter (Signed)
Called the pt's wife X2.  Both times the call was disconnected.  Tried to inform her of below message.  PEC/Triage nurse may inform Mrs. Aiello of below and document.

## 2019-05-12 NOTE — Telephone Encounter (Signed)
Pts wife called in wanting to speak with [name] about the patients medication and she is aware that the medication she stated that the pt threw away is the medication that the pt should be still taking.  She had several questions that she needed to have answered about what the medication is for, who put the pt in the rehab not just the hospital the person name and why he was sent over there.  I want to talk with [name] I let her know that [name] is in with pt and I could get Misty to try to give her a call.  Then the phone disconnected.

## 2019-05-12 NOTE — Telephone Encounter (Signed)
Copied from [place] [phone]. Topic: Quick Communication - See Telephone Encounter >> May 12, 2019 12:58 PM Nils Flack wrote: CRM for notification. See Telephone encounter for: 05/12/19. Wife called - she is wanting call back from cma.  She says it is about medical records [id number]

## 2019-05-12 NOTE — Telephone Encounter (Signed)
I would continue with Imdur. He does not need to take Cerovite multi vitamin if he does not want to

## 2019-05-13 ENCOUNTER — Encounter: Payer: Self-pay | Admitting: Neurology

## 2019-05-13 ENCOUNTER — Ambulatory Visit (INDEPENDENT_AMBULATORY_CARE_PROVIDER_SITE_OTHER): Payer: Medicare Other | Admitting: Neurology

## 2019-05-13 DIAGNOSIS — F0280 Dementia in other diseases classified elsewhere without behavioral disturbance: Secondary | ICD-10-CM

## 2019-05-13 DIAGNOSIS — G301 Alzheimer's disease with late onset: Secondary | ICD-10-CM

## 2019-05-13 DIAGNOSIS — F0390 Unspecified dementia without behavioral disturbance: Secondary | ICD-10-CM

## 2019-05-13 DIAGNOSIS — I6522 Occlusion and stenosis of left carotid artery: Secondary | ICD-10-CM

## 2019-05-13 HISTORY — DX: Unspecified dementia, unspecified severity, without behavioral disturbance, psychotic disturbance, mood disturbance, and anxiety: F03.90

## 2019-05-13 MED ORDER — DONEPEZIL HCL 10 MG PO TABS: 10.0000 mg | ORAL_TABLET | Freq: Every day | ORAL | 3 refills | Status: DC
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
## 2019-05-24 DIAGNOSIS — K8001 Calculus of gallbladder with acute cholecystitis with obstruction: Secondary | ICD-10-CM

## 2019-05-24 HISTORY — DX: Calculus of gallbladder with acute cholecystitis with obstruction: K80.01

## 2019-05-25 ENCOUNTER — Telehealth: Payer: Self-pay | Admitting: Cardiology

## 2019-05-25 ENCOUNTER — Other Ambulatory Visit (HOSPITAL_COMMUNITY)
Admission: RE | Admit: 2019-05-25 | Discharge: 2019-05-25 | Disposition: A | Discharge status: Home / Self Care | Payer: Medicare Other | Source: Ambulatory Visit | Attending: Gastroenterology | Admitting: Gastroenterology

## 2019-05-25 ENCOUNTER — Other Ambulatory Visit: Payer: Self-pay | Admitting: Gastroenterology

## 2019-05-25 DIAGNOSIS — Z1159 Encounter for screening for other viral diseases: Secondary | ICD-10-CM | POA: Insufficient documentation

## 2019-05-25 NOTE — Telephone Encounter (Signed)
Patient wife called in- she states patient is suppose to have a procedure on Friday and they called for pre register to go over medication. They asked about the Metoprolol as he had two on his list- they were unsure of which he should be on. I advised that message from 03/2019- to not take the Metoprolol after x 6 days- and then to stop it, And to do Hydralazine 50 mg twice daily. Patient would like to have Dr.Harding verify this as patient has continued to take the Metoprolol along with the Hydralazine.   Also she states that he was told to increase his Hydralazine after his procedure back to 3 times daily. I advised I did not see this anywhere- but would ask Dr.Harding as well.   Will copy note from 03/2019 conversation.   Please advise. Thank you!   Merlene Laughter, RN    4:40 PM  Note    Wife informed and verbalized understanding of plan.       4:37 PM    Merlene Laughter, RN contacted Versie Starks (Emergency Contact)   Leonie Man, MD  to Merlene Laughter, RN     4:35 PM  Note    OK - then continue Hydralazine as written. To stop Metoprolol take once daily x 6 days & stop.

## 2019-05-25 NOTE — Telephone Encounter (Signed)
° ° °  Pt c/o medication issue:  1. Name of Medication: metoprolol tartrate (LOPRESSOR) 25 MG tablet and metoprolol succinate (TOPROL-XL) 25 MG 24 hr tablet        2. How are you currently taking this medication (dosage and times per day)? N/A  3. Are you having a reaction (difficulty breathing--STAT)? NO  4. What is your medication issue? Spouse calling to confirm the need for both medications

## 2019-05-26 LAB — NOVEL CORONAVIRUS, NAA (HOSP ORDER, SEND-OUT TO REF LAB; TAT 18-24 HRS): SARS-CoV-2, NAA: NOT DETECTED

## 2019-05-26 NOTE — Telephone Encounter (Signed)
The plan is for her to take the hydralazine 3 times a day on the day of his procedure to avoid hypotension.  I think coronary go away from that.  I had stopped his metoprolol with the plan was for him to stop his metoprolol, but if he is still taking it that I do not want him to stop it abruptly.  He is to take the short acting medicine once daily (metoprolol tartrate) for 6 days and then stop.  If he has not stopped it now, then continue it until after the procedure.  Otherwise discontinue hydralazine as he is taking it (my understanding is that he is taking 50 mg twice a day, but if he is only taking 25 mg twice a day --i.e. 1/2 tablet --that is okay).  I did not want his blood pressure to be high when he went for his GI procedure.  If they check his blood pressure the morning of his procedure and it is high have him take twice his normal dose of hydralazine prior to the procedure.  Forget all other recommendations.  Once he starts taking the short acting metoprolol (after the procedure) he will take it for 6 days and then stop we will stop him taking metoprolol altogether.  At that point he should be taking hydralazine 50 mg twice a day   Glenetta Hew, MD

## 2019-05-27 ENCOUNTER — Other Ambulatory Visit: Payer: Self-pay

## 2019-05-27 ENCOUNTER — Encounter (HOSPITAL_COMMUNITY): Payer: Self-pay | Admitting: *Deleted

## 2019-05-27 NOTE — Progress Notes (Signed)
Mr Nicholas Porter denies chest pain or shortness of breath. PCP is Dr. Elizbeth Squires, Cardiologist is Dr. Ellyn Hack. I read Dr Darcus Pester note regarding taking 3 Hydrazalazine day of procedure.  Mr Nicholas Porter was tested for COVID Tuesday, he had issues with his truck and to it for service. Patient reports that he was instructed to arrive at 6:30 am to be retested.

## 2019-05-28 ENCOUNTER — Ambulatory Visit (HOSPITAL_COMMUNITY): Payer: Medicare Other

## 2019-05-28 ENCOUNTER — Encounter (HOSPITAL_COMMUNITY)
Admission: RE | Disposition: A | Discharge status: Home / Self Care | Payer: Self-pay | Source: Home / Self Care | Attending: Gastroenterology

## 2019-05-28 ENCOUNTER — Ambulatory Visit (HOSPITAL_COMMUNITY): Payer: Medicare Other | Admitting: Registered Nurse

## 2019-05-28 ENCOUNTER — Ambulatory Visit (HOSPITAL_COMMUNITY)
Admission: RE | Admit: 2019-05-28 | Discharge: 2019-05-28 | Disposition: A | Discharge status: Home / Self Care | Payer: Medicare Other | Attending: Gastroenterology | Admitting: Gastroenterology

## 2019-05-28 ENCOUNTER — Other Ambulatory Visit: Payer: Self-pay

## 2019-05-28 ENCOUNTER — Encounter (HOSPITAL_COMMUNITY): Payer: Self-pay

## 2019-05-28 DIAGNOSIS — D696 Thrombocytopenia, unspecified: Secondary | ICD-10-CM | POA: Diagnosis not present

## 2019-05-28 DIAGNOSIS — R932 Abnormal findings on diagnostic imaging of liver and biliary tract: Secondary | ICD-10-CM | POA: Diagnosis not present

## 2019-05-28 DIAGNOSIS — D631 Anemia in chronic kidney disease: Secondary | ICD-10-CM | POA: Insufficient documentation

## 2019-05-28 DIAGNOSIS — Z1159 Encounter for screening for other viral diseases: Secondary | ICD-10-CM | POA: Diagnosis not present

## 2019-05-28 DIAGNOSIS — Z4659 Encounter for fitting and adjustment of other gastrointestinal appliance and device: Secondary | ICD-10-CM | POA: Insufficient documentation

## 2019-05-28 DIAGNOSIS — I129 Hypertensive chronic kidney disease with stage 1 through stage 4 chronic kidney disease, or unspecified chronic kidney disease: Secondary | ICD-10-CM | POA: Insufficient documentation

## 2019-05-28 DIAGNOSIS — K219 Gastro-esophageal reflux disease without esophagitis: Secondary | ICD-10-CM | POA: Diagnosis not present

## 2019-05-28 DIAGNOSIS — Z87891 Personal history of nicotine dependence: Secondary | ICD-10-CM | POA: Insufficient documentation

## 2019-05-28 DIAGNOSIS — N184 Chronic kidney disease, stage 4 (severe): Secondary | ICD-10-CM | POA: Insufficient documentation

## 2019-05-28 DIAGNOSIS — Z955 Presence of coronary angioplasty implant and graft: Secondary | ICD-10-CM | POA: Diagnosis not present

## 2019-05-28 DIAGNOSIS — K902 Blind loop syndrome, not elsewhere classified: Secondary | ICD-10-CM | POA: Insufficient documentation

## 2019-05-28 DIAGNOSIS — Z7982 Long term (current) use of aspirin: Secondary | ICD-10-CM | POA: Diagnosis not present

## 2019-05-28 DIAGNOSIS — Z466 Encounter for fitting and adjustment of urinary device: Secondary | ICD-10-CM | POA: Diagnosis not present

## 2019-05-28 DIAGNOSIS — N400 Enlarged prostate without lower urinary tract symptoms: Secondary | ICD-10-CM | POA: Diagnosis not present

## 2019-05-28 DIAGNOSIS — I2510 Atherosclerotic heart disease of native coronary artery without angina pectoris: Secondary | ICD-10-CM | POA: Insufficient documentation

## 2019-05-28 DIAGNOSIS — Z79899 Other long term (current) drug therapy: Secondary | ICD-10-CM | POA: Diagnosis not present

## 2019-05-28 DIAGNOSIS — K838 Other specified diseases of biliary tract: Secondary | ICD-10-CM | POA: Diagnosis not present

## 2019-05-28 DIAGNOSIS — Z4689 Encounter for fitting and adjustment of other specified devices: Secondary | ICD-10-CM

## 2019-05-28 DIAGNOSIS — Z9689 Presence of other specified functional implants: Secondary | ICD-10-CM | POA: Diagnosis not present

## 2019-05-28 DIAGNOSIS — I10 Essential (primary) hypertension: Secondary | ICD-10-CM | POA: Diagnosis not present

## 2019-05-28 HISTORY — DX: Depression, unspecified: F32.A

## 2019-05-28 HISTORY — DX: Anxiety disorder, unspecified: F41.9

## 2019-05-28 HISTORY — PX: STENT REMOVAL: SHX6421

## 2019-05-28 HISTORY — PX: REMOVAL OF STONES: SHX5545

## 2019-05-28 HISTORY — DX: Unspecified osteoarthritis, unspecified site: M19.90

## 2019-05-28 HISTORY — PX: ERCP: SHX5425

## 2019-05-28 LAB — SARS CORONAVIRUS 2: SARS Coronavirus 2: NOT DETECTED

## 2019-05-28 SURGERY — ERCP, WITH INTERVENTION IF INDICATED
Anesthesia: General

## 2019-05-28 MED ORDER — EPHEDRINE SULFATE-NACL 50-0.9 MG/10ML-% IV SOSY
PREFILLED_SYRINGE | INTRAVENOUS | Status: DC | PRN
  Administered 2019-05-28: 10 mg via INTRAVENOUS

## 2019-05-28 MED ORDER — LIDOCAINE 2% (20 MG/ML) 5 ML SYRINGE
INTRAMUSCULAR | Status: DC | PRN
  Administered 2019-05-28: 30 mg via INTRAVENOUS

## 2019-05-28 MED ORDER — ONDANSETRON HCL 4 MG/2ML IJ SOLN
INTRAMUSCULAR | Status: DC | PRN
  Administered 2019-05-28: 4 mg via INTRAVENOUS

## 2019-05-28 MED ORDER — INDOMETHACIN 50 MG RE SUPP
RECTAL | Status: AC
  Filled 2019-05-28: qty 2

## 2019-05-28 MED ORDER — FENTANYL CITRATE (PF) 250 MCG/5ML IJ SOLN
INTRAMUSCULAR | Status: DC | PRN
  Administered 2019-05-28 (×2): 25 ug via INTRAVENOUS

## 2019-05-28 MED ORDER — SODIUM CHLORIDE 0.9 % IV SOLN: INTRAVENOUS | Status: DC

## 2019-05-28 MED ORDER — SUGAMMADEX SODIUM 200 MG/2ML IV SOLN
INTRAVENOUS | Status: DC | PRN
  Administered 2019-05-28: 100 mg via INTRAVENOUS

## 2019-05-28 MED ORDER — PROPOFOL 10 MG/ML IV BOLUS
INTRAVENOUS | Status: DC | PRN
  Administered 2019-05-28: 70 mg via INTRAVENOUS
  Administered 2019-05-28: 20 mg via INTRAVENOUS

## 2019-05-28 MED ORDER — CIPROFLOXACIN IN D5W 400 MG/200ML IV SOLN
INTRAVENOUS | Status: AC
  Filled 2019-05-28: qty 200

## 2019-05-28 MED ORDER — INDOMETHACIN 50 MG RE SUPP
RECTAL | Status: DC | PRN
  Administered 2019-05-28: 100 mg via RECTAL

## 2019-05-28 MED ORDER — CIPROFLOXACIN IN D5W 400 MG/200ML IV SOLN
INTRAVENOUS | Status: DC | PRN
  Administered 2019-05-28: 400 mg via INTRAVENOUS

## 2019-05-28 MED ORDER — SODIUM CHLORIDE 0.9 % IV SOLN
INTRAVENOUS | Status: DC | PRN
  Administered 2019-05-28: 25 mL

## 2019-05-28 MED ORDER — ROCURONIUM BROMIDE 10 MG/ML (PF) SYRINGE
PREFILLED_SYRINGE | INTRAVENOUS | Status: DC | PRN
  Administered 2019-05-28: 30 mg via INTRAVENOUS

## 2019-05-28 MED ORDER — HYDRALAZINE HCL 20 MG/ML IJ SOLN
INTRAMUSCULAR | Status: AC
  Filled 2019-05-28: qty 1

## 2019-05-28 MED ORDER — HYDRALAZINE HCL 20 MG/ML IJ SOLN
10.0000 mg | Freq: Once | INTRAMUSCULAR | Status: AC
  Administered 2019-05-28: 10 mg via INTRAVENOUS

## 2019-05-28 MED ORDER — LACTATED RINGERS IV SOLN
INTRAVENOUS | Status: DC
  Administered 2019-05-28: 09:00:00 via INTRAVENOUS

## 2019-05-28 MED ORDER — GLUCAGON HCL RDNA (DIAGNOSTIC) 1 MG IJ SOLR
INTRAMUSCULAR | Status: AC
  Filled 2019-05-28: qty 1

## 2019-05-28 MED ORDER — LACTATED RINGERS IV SOLN
INTRAVENOUS | Status: DC | PRN
  Administered 2019-05-28: 09:00:00 via INTRAVENOUS

## 2019-05-28 NOTE — Anesthesia Procedure Notes (Signed)
Procedure Name: Intubation Date/Time: 05/28/2019 9:30 AM Performed by: Jearld Pies, CRNA Pre-anesthesia Checklist: Patient identified, Emergency Drugs available, Suction available and Patient being monitored Patient Re-evaluated:Patient Re-evaluated prior to induction Oxygen Delivery Method: Circle System Utilized Preoxygenation: Pre-oxygenation with 100% oxygen Induction Type: IV induction Ventilation: Mask ventilation without difficulty Laryngoscope Size: Mac and 4 Grade View: Grade I Tube type: Oral Tube size: 7.5 mm Number of attempts: 1 Airway Equipment and Method: Stylet and Oral airway Placement Confirmation: ETT inserted through vocal cords under direct vision,  positive ETCO2 and breath sounds checked- equal and bilateral Secured at: 21 cm Tube secured with: Tape Dental Injury: Teeth and Oropharynx as per pre-operative assessment

## 2019-05-28 NOTE — Anesthesia Postprocedure Evaluation (Signed)
Anesthesia Post Note  Patient: Nicholas Porter  Procedure(s) Performed: ENDOSCOPIC RETROGRADE CHOLANGIOPANCREATOGRAPHY (ERCP) (N/A ) STENT REMOVAL REMOVAL OF STONES     Patient location during evaluation: Endoscopy Anesthesia Type: General Level of consciousness: awake and alert Pain management: pain level controlled Vital Signs Assessment: post-procedure vital signs reviewed and stable Respiratory status: spontaneous breathing, nonlabored ventilation, respiratory function stable and patient connected to nasal cannula oxygen Cardiovascular status: blood pressure returned to baseline and stable Postop Assessment: no apparent nausea or vomiting Anesthetic complications: no    Last Vitals:  Vitals:   05/28/19 1130 05/28/19 1145  BP: (!) 250/69 (!) 168/61  Pulse: (!) 52 (!) 50  Resp: 13 18  Temp:    SpO2: 100% 100%    Last Pain:  Vitals:   05/28/19 1145  TempSrc:   PainSc: 0-No pain                 Skylen Spiering COKER

## 2019-05-28 NOTE — Progress Notes (Signed)
10mg  hydralazine IV given per Dr. Linna Caprice for BP 224/128. Will continue to monitor.

## 2019-05-28 NOTE — Progress Notes (Signed)
Joslin MD made aware of patient BP being 200s/100s. Pt denies any pain at this time, A&O. Reports that he did take his BP medications this morning. Will continue to monitor.

## 2019-05-28 NOTE — Discharge Instructions (Signed)
Call if question or problem otherwise clear liquid diet until 4 PM and if doing well may have soft solids and may advance diet tomorrow if doing well and follow-up with me as needed from a GI standpointYOU HAD AN ENDOSCOPIC PROCEDURE TODAY: Refer to the procedure report and other information in the discharge instructions given to you for any specific questions about what was found during the examination. If this information does not answer your questions, please call [facility] office at [phone] to clarify.   YOU SHOULD EXPECT: Some feelings of bloating in the abdomen. Passage of more gas than usual. Walking can help get rid of the air that was put into your GI tract during the procedure and reduce the bloating. If you had a lower endoscopy (such as a colonoscopy or flexible sigmoidoscopy) you may notice spotting of blood in your stool or on the toilet paper. Some abdominal soreness may be present for a day or two, also.  DIET: Your first meal following the procedure should be a light meal and then it is ok to progress to your normal diet. A half-sandwich or bowl of soup is an example of a good first meal. Heavy or fried foods are harder to digest and may make you feel nauseous or bloated. Drink plenty of fluids but you should avoid alcoholic beverages for 24 hours. If you had a esophageal dilation, please see attached instructions for diet.   ACTIVITY: Your care partner should take you home directly after the procedure. You should plan to take it easy, moving slowly for the rest of the day. You can resume normal activity the day after the procedure however YOU SHOULD NOT DRIVE, use power tools, machinery or perform tasks that involve climbing or major physical exertion for 24 hours (because of the sedation medicines used during the test).   SYMPTOMS TO REPORT IMMEDIATELY: A gastroenterologist can be reached at any hour. Please call [phone]  for any of the following symptoms:    Following upper  endoscopy (EGD, EUS, ERCP, esophageal dilation) Vomiting of blood or coffee ground material  New, significant abdominal pain  New, significant chest pain or pain under the shoulder blades  Painful or persistently difficult swallowing  New shortness of breath  Black, tarry-looking or red, bloody stools  FOLLOW UP:  If any biopsies were taken you will be contacted by phone or by letter within the next 1-3 weeks. Call [phone]  if you have not heard about the biopsies in 3 weeks.  Please also call with any specific questions about appointments or follow up tests.

## 2019-05-28 NOTE — Anesthesia Preprocedure Evaluation (Addendum)
Anesthesia Evaluation  Patient identified by MRN, date of birth, ID band Patient awake    Reviewed: Allergy & Precautions, NPO status , Patient's Chart, lab work & pertinent test results  Airway Mallampati: II  TM Distance: >3 FB Neck ROM: Full    Dental  (+) Edentulous Upper, Edentulous Lower   Pulmonary former smoker,    breath sounds clear to auscultation       Cardiovascular hypertension,  Rhythm:Regular + Systolic murmurs RRR with extrasystoles   Neuro/Psych    GI/Hepatic   Endo/Other    Renal/GU      Musculoskeletal   Abdominal   Peds  Hematology   Anesthesia Other Findings   Reproductive/Obstetrics                            Anesthesia Physical Anesthesia Plan  ASA: III  Anesthesia Plan: General   Post-op Pain Management:    Induction: Intravenous  PONV Risk Score and Plan: Ondansetron  Airway Management Planned: Oral ETT  Additional Equipment:   Intra-op Plan:   Post-operative Plan: Extubation in OR  Informed Consent: I have reviewed the patients History and Physical, chart, labs and discussed the procedure including the risks, benefits and alternatives for the proposed anesthesia with the patient or authorized representative who has indicated his/her understanding and acceptance.       Plan Discussed with: CRNA and Anesthesiologist  Anesthesia Plan Comments:         Anesthesia Quick Evaluation

## 2019-05-28 NOTE — Transfer of Care (Signed)
Immediate Anesthesia Transfer of Care Note  Patient: Nicholas Porter  Procedure(s) Performed: ENDOSCOPIC RETROGRADE CHOLANGIOPANCREATOGRAPHY (ERCP) (N/A ) STENT REMOVAL REMOVAL OF STONES  Patient Location: Endoscopy Unit  Anesthesia Type:General  Level of Consciousness: awake, alert  and oriented  Airway & Oxygen Therapy: Patient Spontanous Breathing and Patient connected to face mask oxygen  Post-op Assessment: Report given to RN and Post -op Vital signs reviewed and stable  Post vital signs: Reviewed and stable  Last Vitals:  Vitals Value Taken Time  BP 175/86 05/28/2019 10:38 AM  Temp    Pulse 70 05/28/2019 10:38 AM  Resp 10 05/28/2019 10:38 AM  SpO2 100 % 05/28/2019 10:38 AM    Last Pain:  Vitals:   05/28/19 1038  TempSrc: Oral  PainSc: 0-No pain         Complications: No apparent anesthesia complications

## 2019-05-28 NOTE — Progress Notes (Signed)
Nicholas Porter 9:25 AM  Subjective: Patient without any new GI complaints since we last saw him in the office and presents today for stent removal from his bile leak and his case discussed multiple times with his wife  Objective: Vital signs stable afebrile no acute distress exam please see preassessment evaluation  Assessment: Bile leak resolved with stent  Plan: Okay to proceed with ERCP stent removal and consider repeat injection to confirm no residual CBD stones or leak and will proceed with anesthesia assistance  [facility]  office [phone] After 5PM or if no answer call [phone]

## 2019-05-28 NOTE — Telephone Encounter (Signed)
Called patient, patient had procedure this morning and had no issues with the procedure-  They understood instructions to take metoprolol 6 days after procedure- then stop, continue hydralazine two times daily 50 mg.

## 2019-05-28 NOTE — Op Note (Signed)
[facility] Patient Name: Nicholas Porter Procedure Date : 05/28/2019 MRN: [id number] Attending MD: Clarene Essex , MD Date of Birth: 12-08-1930 CSN: [id number] Age: 85 Admit Type: Inpatient Procedure:                ERCP Indications:              Follow-up of bile leak Providers:                Clarene Essex, MD, Baird Cancer, RN, Marguerita Merles,                            Technician, Elspeth Cho Tech., Technician Referring MD:              Medicines:                General Anesthesia Complications:            No immediate complications. Estimated Blood Loss:     Estimated blood loss: none. Procedure:                Pre-Anesthesia Assessment:                           - Prior to the procedure, a History and Physical                            was performed, and patient medications and                            allergies were reviewed. The patient's tolerance of                            previous anesthesia was also reviewed. The risks                            and benefits of the procedure and the sedation                            options and risks were discussed with the patient.                            All questions were answered, and informed consent                            was obtained. Prior Anticoagulants: The patient has                            taken no previous anticoagulant or antiplatelet                            agents. ASA Grade Assessment: III - A patient with                            severe systemic disease. After reviewing the risks  and benefits, the patient was deemed in                            satisfactory condition to undergo the procedure.                           After obtaining informed consent, the scope was                            passed under direct vision. Throughout the                            procedure, the patient's blood pressure, pulse, and                            oxygen saturations  were monitored continuously. The                            [id number]-[id number] ([id number] Olympus Duodensocope was                            introduced through the mouth, and used to inject                            contrast into and used to cannulate the bile duct.                            The ERCP was accomplished without difficulty. The                            patient tolerated the procedure well. We did the                            procedure on his side per patient request Scope In: Scope Out: Findings:      One plastic stent originating in the biliary tree was emerging from the       major papilla. One stent was removed from the biliary tree using a       snare. The biliary tree was swept with an adjustable 9 -56mm balloon       starting at the bifurcation. Sludge was swept from the duct. The 9 mm       balloon passed readily through the pain sphincterotomy site and an       occlusion cholangiogram was done in the customary fashion and there was       no obvious residual abnormalities and no obvious cystic duct leaking and       multiple balloon pull-through's were done sweeping the CBD with the 12       mm balloon and then lowering to the 9 to remove through the ampulla and       there was adequate biliary drainage at the end of the procedure and the       patient tolerated the procedure well and there was no pancreatic duct       injection or wire advancement throughout the procedure Impression:               -  One stent from the biliary tree was seen in the                            major papilla.                           - One stent was removed from the biliary tree.                           - The biliary tree was swept and sludge was found.                            But no abnormalities at the end of the procedure                            and no bile leak on occlusion cholangiogram Recommendation:           - Clear liquid diet for 6 hours. If doing well may                             slowly advance to soft solids later today                           - Continue present medications.                           - Return to GI clinic PRN.                           - Telephone GI clinic if symptomatic PRN. Procedure Code(s):        --- Professional ---                           [phone], Endoscopic retrograde                            cholangiopancreatography (ERCP); with removal of                            foreign body(s) or stent(s) from biliary/pancreatic                            duct(s)                           43264, Endoscopic retrograde                            cholangiopancreatography (ERCP); with removal of                            calculi/debris from biliary/pancreatic duct(s) Diagnosis Code(s):        --- Professional ---                           Y81.44, Presence of other  specified functional                            implants                           Z46.59, Encounter for fitting and adjustment of                            other gastrointestinal appliance and device                           K83.8, Other specified diseases of biliary tract CPT copyright 2019 [employer]. All rights reserved. The codes documented in this report are preliminary and upon coder review may  be revised to meet current compliance requirements. Clarene Essex, MD 05/28/2019 10:37:30 AM This report has been signed electronically. Number of Addenda: 0

## 2019-05-30 ENCOUNTER — Encounter (HOSPITAL_COMMUNITY): Payer: Self-pay | Admitting: Gastroenterology

## 2019-05-31 ENCOUNTER — Telehealth: Payer: Self-pay | Admitting: Cardiology

## 2019-05-31 NOTE — Telephone Encounter (Signed)
New Message      Pt c/o medication issue:  1. Name of Medication: Metoprolol Succinate and Metoprolol Tartrate   2. How are you currently taking this medication (dosage and times per day)? Both are 25mg    3. Are you having a reaction (difficulty breathing--STAT)? No   4. What is your medication issue? Pts wife is wondering which one he is suppose to be taking because she has both medications    Please call

## 2019-05-31 NOTE — Telephone Encounter (Signed)
Reviewed Metoprolol succ and tartrate with wife   Instructions below given to wife, verbalized understanding.     May 26, 2019  Leonie Man, MD  to Caprice Beaver, LPN    9:73 PM  Note    The plan is for her to take the hydralazine 3 times a day on the day of his procedure to avoid hypotension.  I think coronary go away from that.  I had stopped his metoprolol with the plan was for him to stop his metoprolol, but if he is still taking it that I do not want him to stop it abruptly.  He is to take the short acting medicine once daily (metoprolol tartrate) for 6 days and then stop.  If he has not stopped it now, then continue it until after the procedure.  Otherwise discontinue hydralazine as he is taking it (my understanding is that he is taking 50 mg twice a day, but if he is only taking 25 mg twice a day --i.e. 1/2 tablet --that is okay).  I did not want his blood pressure to be high when he went for his GI procedure.  If they check his blood pressure the morning of his procedure and it is high have him take twice his normal dose of hydralazine prior to the procedure.  Forget all other recommendations.  Once he starts taking the short acting metoprolol (after the procedure) he will take it for 6 days and then stop we will stop him taking metoprolol altogether.  At that point he should be taking hydralazine 50 mg twice a day   Glenetta Hew, MD

## 2019-06-08 DIAGNOSIS — H353130 Nonexudative age-related macular degeneration, bilateral, stage unspecified: Secondary | ICD-10-CM | POA: Diagnosis not present

## 2019-06-11 DIAGNOSIS — I340 Nonrheumatic mitral (valve) insufficiency: Secondary | ICD-10-CM | POA: Diagnosis not present

## 2019-06-11 DIAGNOSIS — N2581 Secondary hyperparathyroidism of renal origin: Secondary | ICD-10-CM | POA: Diagnosis not present

## 2019-06-11 DIAGNOSIS — I129 Hypertensive chronic kidney disease with stage 1 through stage 4 chronic kidney disease, or unspecified chronic kidney disease: Secondary | ICD-10-CM | POA: Diagnosis not present

## 2019-06-11 DIAGNOSIS — K219 Gastro-esophageal reflux disease without esophagitis: Secondary | ICD-10-CM | POA: Diagnosis not present

## 2019-06-11 DIAGNOSIS — I779 Disorder of arteries and arterioles, unspecified: Secondary | ICD-10-CM | POA: Diagnosis not present

## 2019-06-11 DIAGNOSIS — D631 Anemia in chronic kidney disease: Secondary | ICD-10-CM | POA: Diagnosis not present

## 2019-06-11 DIAGNOSIS — N183 Chronic kidney disease, stage 3 (moderate): Secondary | ICD-10-CM | POA: Diagnosis not present

## 2019-06-11 DIAGNOSIS — E875 Hyperkalemia: Secondary | ICD-10-CM | POA: Diagnosis not present

## 2019-06-11 DIAGNOSIS — N179 Acute kidney failure, unspecified: Secondary | ICD-10-CM | POA: Diagnosis not present

## 2019-06-11 DIAGNOSIS — I213 ST elevation (STEMI) myocardial infarction of unspecified site: Secondary | ICD-10-CM | POA: Diagnosis not present

## 2019-06-11 DIAGNOSIS — R634 Abnormal weight loss: Secondary | ICD-10-CM | POA: Diagnosis not present

## 2019-07-07 ENCOUNTER — Other Ambulatory Visit: Payer: Self-pay

## 2019-07-07 ENCOUNTER — Ambulatory Visit (INDEPENDENT_AMBULATORY_CARE_PROVIDER_SITE_OTHER): Payer: Medicare Other | Admitting: Adult Health

## 2019-07-07 ENCOUNTER — Encounter: Payer: Self-pay | Admitting: Adult Health

## 2019-07-07 DIAGNOSIS — S60512A Abrasion of left hand, initial encounter: Secondary | ICD-10-CM | POA: Diagnosis not present

## 2019-07-07 DIAGNOSIS — W548XXA Other contact with dog, initial encounter: Secondary | ICD-10-CM | POA: Diagnosis not present

## 2019-07-07 MED ORDER — DOXYCYCLINE HYCLATE 100 MG PO CAPS: 100.0000 mg | ORAL_CAPSULE | Freq: Two times a day (BID) | ORAL | 0 refills | Status: DC

## 2019-07-07 NOTE — Progress Notes (Signed)
Virtual Visit via Telephone Note  I connected with Nicholas Porter on 07/07/19 at  3:00 PM EDT by telephone and verified that I am speaking with the correct person using two identifiers.   I discussed the limitations, risks, security and privacy concerns of performing an evaluation and management service by telephone and the availability of in person appointments. I also discussed with the patient that there may be a patient responsible charge related to this service. The patient expressed understanding and agreed to proceed.  Location patient: home Location provider: work or home office Participants present for the call: patient, provider, wife Patient did not have a visit in the prior 7 days to address this/these issue(s).   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), CAD S/P percutaneous coronary angioplasty - DES in RCA with PTCA for ISR; Moderate mLAD, 80% ostial OM2 stable (05/22/2008), CKD (chronic kidney disease) stage 3, GFR 30-59 ml/min ([facility]), COPD (chronic obstructive pulmonary disease) ([place]), Dementia ([place]) (05/13/2019), Depression, Dyspnea, Essential hypertension (06/30/2007), Eye abnormalities, H/O Inferior STEMI (09/2013) emergent PCI with Promus DES to RCA (3.0 mm  x 28 mm - 3.2 mm) (10/05/2013), Hypothyroidism, Klebsiella sepsis ([place]) (04/02/2017), Left-sided carotid artery disease -- s/p CEA (04/06/2006), and Mitral regurgitation - onexam (09/03/2016).  He is being evaluated today for concern of infection on left hand. He was scratched by his pet dog 7 days ago. His wife has been applying a bandage. She reports that over the last week there has been some localized redness, swelling, and pain. There is scant  discharge on the bandage.   He denies fevers or chills.    Observations/Objective: Patient sounds cheerful and well on the phone. I do not appreciate any SOB. Speech and thought processing are grossly intact. Patient reported vitals:  Assessment and Plan:  1. Dog scratch - Will cover with doxycycline for localized infection. Advised to keep wound clean and dry. Continue to use bandage.  - doxycycline (VIBRAMYCIN) 100 MG capsule; Take 1 capsule (100 mg total) by mouth 2 (two) times daily.  Dispense: 14 capsule; Refill: 0 - Follow up if wound not healing or appears to be getting worse  Follow Up Instructions:  I did not refer this patient for an OV in the next 24 hours for this/these issue(s).  I discussed the assessment and treatment plan with the patient. The patient was provided an opportunity to ask questions and all were answered. The patient agreed with the plan and demonstrated an understanding of the instructions.   The patient was advised to call back or seek an in-person evaluation if the symptoms worsen or if the condition fails to improve as anticipated.  I provided 15 minutes of non-face-to-face time during this encounter.   Dorothyann Peng, NP

## 2019-07-12 ENCOUNTER — Other Ambulatory Visit: Payer: Self-pay | Admitting: Adult Health

## 2019-07-14 NOTE — Telephone Encounter (Signed)
Looks like cardiology is filling

## 2019-07-14 NOTE — Telephone Encounter (Signed)
Denied.  Cardiology filling.  Message sent to the pharmacy.

## 2019-07-16 ENCOUNTER — Telehealth: Payer: Self-pay | Admitting: Cardiology

## 2019-07-16 NOTE — Telephone Encounter (Signed)
LVM, reminding pt of his appt on 07-19-19 with Dr Ellyn Hack. I also asked pt to call back to be pre-screened for COVID-19.O

## 2019-07-19 ENCOUNTER — Other Ambulatory Visit: Payer: Self-pay

## 2019-07-19 ENCOUNTER — Ambulatory Visit (INDEPENDENT_AMBULATORY_CARE_PROVIDER_SITE_OTHER): Payer: Medicare Other | Admitting: Cardiology

## 2019-07-19 ENCOUNTER — Telehealth: Payer: Self-pay | Admitting: Adult Health

## 2019-07-19 VITALS — BP 102/44 | HR 74 | Temp 97.7°F | Ht 67.0 in | Wt 92.2 lb

## 2019-07-19 DIAGNOSIS — Z9861 Coronary angioplasty status: Secondary | ICD-10-CM | POA: Diagnosis not present

## 2019-07-19 DIAGNOSIS — I350 Nonrheumatic aortic (valve) stenosis: Secondary | ICD-10-CM | POA: Diagnosis not present

## 2019-07-19 DIAGNOSIS — R627 Adult failure to thrive: Secondary | ICD-10-CM | POA: Diagnosis not present

## 2019-07-19 DIAGNOSIS — I2510 Atherosclerotic heart disease of native coronary artery without angina pectoris: Secondary | ICD-10-CM | POA: Diagnosis not present

## 2019-07-19 DIAGNOSIS — I951 Orthostatic hypotension: Secondary | ICD-10-CM

## 2019-07-19 DIAGNOSIS — I340 Nonrheumatic mitral (valve) insufficiency: Secondary | ICD-10-CM

## 2019-07-19 DIAGNOSIS — E440 Moderate protein-calorie malnutrition: Secondary | ICD-10-CM | POA: Diagnosis not present

## 2019-07-19 DIAGNOSIS — I6522 Occlusion and stenosis of left carotid artery: Secondary | ICD-10-CM | POA: Diagnosis not present

## 2019-07-19 DIAGNOSIS — E43 Unspecified severe protein-calorie malnutrition: Secondary | ICD-10-CM

## 2019-07-19 DIAGNOSIS — E785 Hyperlipidemia, unspecified: Secondary | ICD-10-CM | POA: Diagnosis not present

## 2019-07-19 MED ORDER — HYDRALAZINE HCL 50 MG PO TABS: ORAL_TABLET | ORAL | 0 refills | Status: DC

## 2019-07-19 NOTE — Patient Instructions (Addendum)
Medication Instructions:   stop taking Hydralazine -  Check blood pressure  2 to 3 3 weeks  - use  As needed if blood pressure  Greater than 150 mmHg ( then take a tablet)  If you need a refill on your cardiac medications before your next appointment, please call your pharmacy.   Lab work: Not needed  Testing/Procedures:  Not needed  Follow-Up: At [facility], you and your health needs are our priority.  As part of our continuing mission to provide you with exceptional heart care, we have created designated Provider Care Teams.  These Care Teams include your primary Cardiologist (physician) and Advanced Practice Providers (APPs -  Physician Assistants and Nurse Practitioners) who all work together to provide you with the care you need, when you need it. . You will need a follow up appointment in 7 to 8  months.  Please call our office 2 months in advance to schedule this appointment.  You may see Glenetta Hew, MD or one of the following Advanced Practice Providers on your designated Care Team:   . Rosaria Ferries, PA-C . Jory Sims, DNP, ANP  Any Other Special Instructions Will Be Listed Below (If Applicable).  DNR ( do not resuscitate order)  - SIGNED  Keep in a place were someone can see it

## 2019-07-19 NOTE — Progress Notes (Signed)
PCP: Nicholas Peng, NP  Clinic Note: Chief Complaint  Patient presents with  . Follow-up     c/o staying cold all the time and left side pain from fall between toilet and sink due to loss of balance. Meds reviewed verbally with pt.  . Coronary Artery Disease    No angina    HPI: Nicholas Porter is a 85 y.o. male with a PMH notedd below who presents today for post-hospital f/u.  CVD History:  H/O Inferior STEMI (09/2013) emergent PCI with Promus DES to RCA (3.0 mm x 28 mm - 3.2 mm)  Unstable Angina 05/2014: ISR - cutting balloon PTCA ; Ostial OM2 80% (stable) -  Most recent Echo and Myoview April 2018: EF 51%.  Low risk.  Echo with EF 45 to 50%.  GR 1 DD.  Mild AI and TR.  Mild to moderate MR.  L Carotid Dz - s/p CEA 03/2014   Nicholas Porter was last seen in March 2020 recently DC'd from rehab where he had been undergoing postop rehab from cholelithiasis-ERCP-biliary stent etc.  Has been at [facility] for rehab.  Not really eating all that well but overall it appetite was improving.  Poor balance.  Overall weak.  Not back to activity levels.  At that time was still somewhat doing activities of daily living.  Problems involving macular degeneration.  Having a hard time gaining weight. --No real angina symptoms. --> Made some adjustments to his hydralazine until he can have a GI procedure done.  I then wanted him to take it twice daily.  There is been some questions about dosing since then.  Recent Hospitalizations:   ERCP removal of biliary stent June 2020  Studies Personally Reviewed - (if available, images/films reviewed: From Epic Chart or Care Everywhere)  No new studies  Interval History: Nicholas Porter presents here today along with his wife.  They have been very limited as far as activities or anything since the onset of COVID-19.  They have been having lots of financial issues.  Very low on money.  Living off Nicholas Porter and barely making ends meet.  They are running out  of food and has been eating peanut butter on crackers for some meals. Nicholas Porter continues to get weaker.  More fatigue.  He is dizzy and lightheaded and has had multiple falls -thankfully, but no significant injuries.  He is had at least 3 or 4 falls this past week.  Just no balance.  Week, legs give out on him.  Does not really describe vertigo symptoms as much is just generalized weakness. He gets a little bit short of breath when he first tries going in the morning.  But otherwise really notes more just lack of energy and fatigue as opposed to dyspnea with activity.  Pretty much according to his wife, the main level activity he does is walking from the bedroom to the dining room and then dining room to the living room where he is on his recliner.  He is now pretty much sleeping in a recliner -> as much because of comfort and lack of mobility then from true orthopnea.  Minimal edema but no PND. Has not had to use any nitroglycerin since I last saw him. No sensation of rapid irregular heartbeats palpitations.  He has had falls with near syncope, but no true syncope  No further GI issues since his biliary stent has been removed.  No fevers or chills.  No TIA/amaurosis fugax symptoms. No claudication.  ROS:  A comprehensive was performed. Review of Systems  Constitutional: Positive for malaise/fatigue (Still very weak and getting weaker.  Poor balance) and weight loss.  HENT: Positive for nosebleeds (See HPI). Negative for congestion.   Respiratory: Negative for cough, shortness of breath and wheezing.   Gastrointestinal: Positive for abdominal pain (Postop abdominal pain is gradually getting better). Negative for blood in stool, constipation, diarrhea, heartburn and melena.  Genitourinary: Negative for dysuria and hematuria.       Only really urinates once at night  Musculoskeletal: Negative for falls and joint pain.  Neurological: Positive for dizziness (Sometimes with positional change), tingling (  in his hands and feet sometimes) and weakness (Generalized). Negative for focal weakness.  Psychiatric/Behavioral: Positive for depression and memory loss. Negative for hallucinations (No more hallucinations). The patient is nervous/anxious and has insomnia.   All other systems reviewed and are negative.   I have reviewed and (if needed) personally updated the patient's problem list, medications, allergies, past medical and surgical history, social and family history.   Past Medical History:  Diagnosis Date  . Acute metabolic encephalopathy 03/26/346  . Acute renal failure superimposed on stage 4 chronic kidney disease ([place]) 11/27/2015  . Anemia of chronic renal failure, stage 4 (severe) ([place]) 11/27/2015  . Anxiety   . Arthritis    back  . 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 CAD S/P percutaneous coronary angioplasty - DES in RCA with PTCA for ISR; Moderate mLAD, 80% ostial OM2 stable 05/22/2008   Qualifier: Diagnosis of  By: Nicholas Hedges MD, Nicholas Porter   . CKD (chronic kidney disease) stage 3, GFR 30-59 ml/min ([place])   . COPD (chronic obstructive pulmonary disease) ([place])   . Dementia ([place]) 05/13/2019  . Depression   . Dyspnea    with activity   . Essential hypertension 06/30/2007   Qualifier: Diagnosis of  By: Nicholas Razor RN, Nicholas Porter Eye abnormalities    injections in eyes 12/2017  . H/O Inferior STEMI (09/2013) emergent PCI with Promus DES to RCA (3.0 mm  x 28 mm - 3.2 mm) 10/05/2013  . Hypothyroidism   . Klebsiella sepsis ([place]) 04/02/2017  . Left-sided carotid artery disease -- s/p CEA 04/06/2006   S/p Left CEA   . Mitral regurgitation - onexam 09/03/2016    Past Surgical History:  Procedure Laterality Date  . AMPUTATION     left index finger -tramatic  . BILIARY STENT PLACEMENT N/A 01/06/2019   Procedure: BILIARY STENT PLACEMENT;  Surgeon: Nicholas Essex, MD;  Location: [facility];  Service: Endoscopy;   Laterality: N/A;  . CARDIAC CATHETERIZATION  November 2011   40% mid LAD, 50% proximal RCA, 30-40% distal RCA (DOMINANT RCA with wraparound PDA & 2 large PLBs), 60-70% ostial OM1. No change from 2001. Medical therapy  . CAROTID ENDARTERECTOMY Left 2007   Dr.Hayes: 08/2016 Dopplers: Stable less than 40% right internal carotid stenosis and stable moderate 40-60% left internal carotid stenosis. Patent vertebral and subclavian arteries.  . CARPAL TUNNEL RELEASE    . CHOLECYSTECTOMY N/A 01/02/2019   Procedure: LAPAROSCOPIC CHOLECYSTECTOMY;  Surgeon: Michael Boston, MD;  Location: [facility];  Service: General;  Laterality: N/A;  . CORNEAL TRANSPLANT    . CORONARY ANGIOPLASTY WITH STENT PLACEMENT  09/2013; 05/2014    d) Promus DES 3.0 mm x 28 mm (3.2 mm)  to RCA with STEMI; residual 70% mid-distal LAD, OM 270-80%. Medical management; b)  PTCA of 75 % ISR , LAD lesion noted as ~40%  . ERCP N/A 01/06/2019   Procedure: ENDOSCOPIC RETROGRADE CHOLANGIOPANCREATOGRAPHY (ERCP);  Surgeon: Nicholas Essex, MD;  Location: [facility];  Service: Endoscopy;  Laterality: N/A;  . ERCP N/A 05/28/2019   Procedure: ENDOSCOPIC RETROGRADE CHOLANGIOPANCREATOGRAPHY (ERCP);  Surgeon: Nicholas Essex, MD;  Location: [facility];  Service: Endoscopy;  Laterality: N/A;  . FOOT SURGERY     left  . HERNIA REPAIR    . LEFT HEART CATHETERIZATION WITH CORONARY ANGIOGRAM N/A 10/05/2013   Procedure: LEFT HEART CATHETERIZATION WITH CORONARY ANGIOGRAM;  Surgeon: Peter M Martinique, MD;  Location: [facility] CATH LAB;  Service: Cardiovascular;  RCA 100% - PCI,CxOM stable, LAD ~70% mid;  . LEFT HEART CATHETERIZATION WITH CORONARY ANGIOGRAM N/A 06/08/2014   Procedure: LEFT HEART CATHETERIZATION WITH CORONARY ANGIOGRAM;  Surgeon: Troy Sine, MD;  Location: [facility] CATH LAB;  Service: Cardiovascular;  UA 6/'15: 75% ISR in RCA - PTCA, LAD lesion ~40%, CxOM stable.    Marland Kitchen NM MYOVIEW LTD  03/2017    - Elevatred Troponin in setting of Sepsis -- DEMAND ISCHEMIA.  Normal  perfusion. LVEF 51% with normal wall motion. This is a low risk study.  Marland Kitchen PARTIAL GASTRECTOMY    . PERCUTANEOUS CORONARY INTERVENTION-BALLOON ONLY  06/08/2014   Procedure: PERCUTANEOUS CORONARY INTERVENTION-BALLOON ONLY;  Surgeon: Troy Sine, MD;  Location: [facility] CATH LAB;  Service: Cardiovascular;;  . REMOVAL OF STONES  05/28/2019   Procedure: REMOVAL OF STONES;  Surgeon: Nicholas Essex, MD;  Location: [place];  Service: Endoscopy;;  . ROTATOR CUFF REPAIR     left  . SHOULDER ARTHROSCOPY WITH ROTATOR CUFF REPAIR AND SUBACROMIAL DECOMPRESSION Right 07/01/2013   Procedure: RIGHT SHOULDER ARTHROSCOPY WITH  SUBACROMIAL DECOMPRESSION/DISTAL CLAVICLE RESECTION/ROTATOR CUFF REPAIR ;  Surgeon: Marin Shutter, MD;  Location: [place];  Service: Orthopedics;  Laterality: Right;  . SPHINCTEROTOMY  01/06/2019   Procedure: SPHINCTEROTOMY;  Surgeon: Nicholas Essex, MD;  Location: [facility];  Service: Endoscopy;;  . SPINE SURGERY  1985   cervical laminectomy  . STENT REMOVAL  05/28/2019   Procedure: STENT REMOVAL;  Surgeon: Nicholas Essex, MD;  Location: [facility];  Service: Endoscopy;;  . TOTAL KNEE ARTHROPLASTY    . TRANSTHORACIC ECHOCARDIOGRAM  09/'17; 4/'18; 1/'20   a) Normal EF 55-60%. Mild to moderate AS with mod AI. Mild MR.;; b) Mild global reduction in LV systolic function: 95-18%; grade 1 DD - high LVEDP. Mild AS/AI/TR. Mild-Mod MR; moderately elevated PAP.;; c) TTE 12/2018: EF 55-60%. No RWMA. Gr1 DD. Mod AS with mild AI. Severe LA dilation.  Marland Kitchen VAGOTOMY     partial gastrectomy   From last visit  aspirin EC 81 MG tabletTake 1 tablet (81 mg total) by mouth daily.   . brinzolamide (AZOPT) 1 % ophthalmic suspension Place 1 drop into the left eye every 12 (twelve) hours.   . metoprolol succinate (TOPROL-XL) 25 MG 24 hr tablet Take 1 tablet (25 mg total) by mouth daily.  Marland Kitchen torsemide (DEMADEX) 20 MG tablet Take 40 mg by mouth daily.  . TRAVATAN Z 0.004 % SOLN ophthalmic solution     Current Meds   Medication Sig  . acetaminophen (TYLENOL) 650 MG CR tablet Take 1,300 mg by mouth every 8 (eight) hours as needed for pain.  . brinzolamide (AZOPT) 1 % ophthalmic suspension Place 1 drop into the left eye every 12 (twelve) hours.   Marland Kitchen donepezil (ARICEPT) 10 MG tablet Take 1 tablet (10 mg total) by mouth  at bedtime.  . hydrALAZINE (APRESOLINE) 50 MG tablet CHECK BLOOD PRESSURE 2  TO 3 TIMES A DAY ,  IF  BLOOD PRESSURE IS GREATER THAN 150 mmHg TAKE A TABLET AS NEEDED..  . isosorbide mononitrate (IMDUR) 60 MG 24 hr tablet Take 1 tablet (60 mg total) by mouth daily.  Marland Kitchen levothyroxine (SYNTHROID) 25 MCG tablet Take 1 tablet (25 mcg total) by mouth daily before breakfast.  . Multiple Vitamin (MULTIVITAMIN WITH MINERALS) TABS tablet Take 1 tablet by mouth daily.  . nitroGLYCERIN (NITROSTAT) 0.4 MG SL tablet DISSOLVE 1 TABLET UNDER THE TONGUE EVERY 5 MINUTES AS NEEDED FOR CHEST PAIN  . TRAVATAN Z 0.004 % SOLN ophthalmic solution Place 1 drop into both eyes at bedtime.   . [DISCONTINUED] hydrALAZINE (APRESOLINE) 50 MG tablet Take 0.5 tablets (25 mg total) by mouth 2 (two) times daily. (Patient taking differently: Take 50 mg by mouth 2 (two) times daily. )    Allergies  Allergen Reactions  . Amlodipine Besylate Hives  . Lipitor [Atorvastatin] Itching  . Naproxen Diarrhea  . Oxycodone-Acetaminophen Itching  . Isosorbide Rash    Social History   Tobacco Use  . Smoking status: Former Smoker    Years: 15.00    Types: Cigarettes    Quit date: 12/23/1964    Years since quitting: 54.6  . Smokeless tobacco: Never Used  Substance Use Topics  . Alcohol use: No  . Drug use: No   Social History   Social History Narrative   He is married with 2 children. He lives with his wife.   Very active at home, prior to a STEMI, he could walk several miles without discomfort. Prior to his MI, he would use his push mower to mow his entire lawn.   He is a former smoker and quit back in 1966. He does not drink  alcohol      CODE STATUS: DO NOT RESUSCITATE  CODE STATUS: DO NOT RESUSCITATE  family history includes Diabetes in his sister; Heart disease in his sister.  Wt Readings from Last 3 Encounters:  07/19/19 92 lb 4 oz (41.8 kg)  03/03/19 99 lb 3.2 oz (45 kg)  03/02/19 98 lb (44.5 kg)  - gaining weight  PHYSICAL EXAM BP (!) 102/44 (BP Location: Right Arm, Patient Position: Sitting, Cuff Size: Normal)   Pulse 74   Temp 97.7 F (36.5 C)   Ht 5\' 7"  (1.702 m)   Wt 92 lb 4 oz (41.8 kg)   SpO2 98%   BMI 14.45 kg/m  Physical Exam  Constitutional: He is oriented to person, place, and time. No distress.  Essentially now cachectic gentleman.  Well-groomed.  He is wearing a wall flannel shirt today in the middle of the summer indicating that he cannot get warm.  Unshaven, but otherwise well-groomed.  HENT:  Head: Normocephalic and atraumatic.  Temporal wasting  Neck: Normal range of motion. Neck supple. No hepatojugular reflux and no JVD present. Carotid bruit is not present.  Cardiovascular: Normal rate and regular rhythm.  No extrasystoles are present. PMI is not displaced (Hyperdynamic precordium, likely because of minimal muscle mass.  PMI nondisplaced, but is sustained.). Exam reveals no gallop, no friction rub and no decreased pulses.  Murmur heard.  Medium-pitched harsh crescendo-decrescendo mid to late systolic murmur is present with a grade of 3/6 at the upper right sternal border radiating to the neck. High-pitched blowing decrescendo early diastolic murmur is present with a grade of 1/6 at the upper right sternal border  radiating to the apex. Pulmonary/Chest: Effort normal and breath sounds normal. No respiratory distress. He has no wheezes. He has no rales.  Abdominal: Soft. Bowel sounds are normal. He exhibits no distension.  Pretty much scaphoid abdomen.  Biliary drain out.  Minimal right upper quadrant tenderness  Musculoskeletal: Normal range of motion.        General: No edema.   Neurological: He is alert and oriented to person, place, and time.  Psychiatric: Judgment and thought content normal.  Does not appear as weak or chronically ill.  However he just seems down.  Seems almost resigned to the inevitability of getting older and getting weaker.  Vitals reviewed.    Adult ECG Report -Not checked  Other studies Reviewed: Additional studies/ records that were reviewed today include:  Recent Labs:   Lab Results  Component Value Date   CREATININE 1.87 (H) 02/09/2019   BUN 47 (H) 02/09/2019   NA 139 02/09/2019   K 5.1 02/09/2019   CL 110 02/09/2019   CO2 20 02/09/2019   No longer following lipids   ASSESSMENT / PLAN: Problem List Items Addressed This Visit    Protein-calorie malnutrition, severe    At this point I am concerned of almost more severe malnutrition than moderate.  He has borderline failure to thrive.  Nicholas Porter will need to look into potential Meals on Wheels or other options for them to obtain meals.  Needs proteins.  No concerns about what ever he eats.      Orthostatic hypotension    I am very worried about his dizziness, poor balance.  Despite removing medications, he remains hypotensive. Nicholas Porter are still trying to allow permissive hypertension -but not being successful.Marland Kitchen    He is now on less and less medications.  Nicholas Porter will continue the Imdur because of CAD, but I will essentially remove the hydralazine.  Only take it as needed for blood pressures greater than 150 mmHg.      Relevant Medications   hydrALAZINE (APRESOLINE) 50 MG tablet   Other Relevant Orders   EKG 12-Lead (Completed)   Moderate aortic stenosis by prior echocardiogram (Chronic)    Murmur sounds relatively the same.  Based on his wishes.  No further testing.  At this point, he voices no interest whatsoever in invasive procedures.      Relevant Medications   hydrALAZINE (APRESOLINE) 50 MG tablet   Other Relevant Orders   EKG 12-Lead (Completed)   Mitral regurgitation -  on exam   Relevant Medications   hydrALAZINE (APRESOLINE) 50 MG tablet   Other Relevant Orders   EKG 12-Lead (Completed)   RESOLVED: Malnutrition of moderate degree           Left-sided carotid artery disease -- s/p CEA (Chronic)    I think Nicholas Porter will hold off on doing further evaluations.      Relevant Medications   hydrALAZINE (APRESOLINE) 50 MG tablet   Hyperlipidemia with target LDL less than 70 (Chronic)    At this point time I am more worried about his nutrition.  I have not seen labs in some time.  Nicholas Porter stopped statin a long time ago.  Per his wishes, Nicholas Porter will not go back on statin.  He does not want take any other medicines.      Relevant Medications   hydrALAZINE (APRESOLINE) 50 MG tablet   Failure to thrive in adult - Primary    Unfortunately, in part because of COVID-19 limitations, and his ongoing comorbidities, he is not  eating very well, not maintaining weight.  Still losing weight.  Now 4 more pounds down.  At this point I am simply removing medications leaving only Imdur.  Nicholas Porter discussed and signed a DNR form. ->  Next up will be MOST form. He has voiced a desire to avoid returning to the hospital.  No further testing.      CAD S/P percutaneous coronary angioplasty - DES in RCA with PTCA for ISR; Moderate mLAD, 80% ostial OM2 stable (Chronic)    RCA stent followed by PTCA.  Moderate LAD disease and small ostial OM disease.  Not really having any angina on current dose of Imdur.  With low blood pressures, no other medications would be available.  In the absence of active angina would not use Ranexa. He is on Imdur. Because of weakness and failure to thrive Nicholas Porter stopped his statin. Also for weakness and fatigue Nicholas Porter stopped beta-blocker.  Per patient request.  No plans for further ischemic evaluation with stress test or cardiac catheterization.  Unless he would have profound symptoms, Nicholas Porter would not consider screening evaluation.      Relevant Medications   hydrALAZINE  (APRESOLINE) 50 MG tablet   Other Relevant Orders   EKG 12-Lead (Completed)      I spent a total of ~45-50 minutes with the patient and chart review. >  50% of the time was spent in direct patient consultation.  A good portion of this visit was spent discussing his poor nutrition, their financial struggles and difficulties with access to food and groceries. --> Nicholas Porter will try to see if Nicholas Porter can mobilize resources to assist with meals if possible. Also discussed end-of-life concerns as far as his living will and DNR status.  I explained the difference between living will and a DNR order.  They clearly indicated extensively for both of them, but significantly for him the desire to be made DNR and not have CPR.  As such, I did provide a DNR form.   Would encourage either his PCP or the next time I see him at that Nicholas Porter discussed filling out a MOST form  Current medicines are reviewed at length with the patient today.  (+/- concerns) -still dizzy.  Patient Instructions  Medication Instructions:   stop taking Hydralazine -  Check blood pressure  2 to 3 3 weeks  - use  As needed if blood pressure  Greater than 150 mmHg ( then take a tablet)  If you need a refill on your cardiac medications before your next appointment, please call your pharmacy.   Lab work: Not needed  Testing/Procedures:  Not needed  Follow-Up: At [facility], you and your health needs are our priority.  As part of our continuing mission to provide you with exceptional heart care, Nicholas Porter have created designated Provider Care Teams.  These Care Teams include your primary Cardiologist (physician) and Advanced Practice Providers (APPs -  Physician Assistants and Nurse Practitioners) who all work together to provide you with the care you need, when you need it. . You will need a follow up appointment in 7 to 8  months.  Please call our office 2 months in advance to schedule this appointment.  You may see Glenetta Hew, MD or one of the  following Advanced Practice Providers on your designated Care Team:   . Rosaria Ferries, PA-C . Jory Sims, DNP, ANP  Any Other Special Instructions Will Be Listed Below (If Applicable).  DNR ( do not resuscitate order)  - SIGNED  Keep in a place were someone can see it   Studies Ordered:   Orders Placed This Encounter  Procedures  . EKG 12-Lead      Glenetta Hew, M.D., M.S. Interventional Cardiologist   Pager # [phone] Phone # [phone] [address] [place], [place] [address]   Thank you for choosing Heartcare at [facility]!!

## 2019-07-19 NOTE — Telephone Encounter (Signed)
Pt's wife calling to make sure it is okay for pt to take Vitamin B12 daily.

## 2019-07-20 ENCOUNTER — Encounter: Payer: Self-pay | Admitting: Cardiology

## 2019-07-20 DIAGNOSIS — R627 Adult failure to thrive: Secondary | ICD-10-CM | POA: Insufficient documentation

## 2019-07-20 NOTE — Assessment & Plan Note (Signed)
Murmur sounds relatively the same.  Based on his wishes.  No further testing.  At this point, he voices no interest whatsoever in invasive procedures.

## 2019-07-20 NOTE — Assessment & Plan Note (Addendum)
I am very worried about his dizziness, poor balance.  Despite removing medications, he remains hypotensive. We are still trying to allow permissive hypertension -but not being successful.Marland Kitchen    He is now on less and less medications.  We will continue the Imdur because of CAD, but I will essentially remove the hydralazine.  Only take it as needed for blood pressures greater than 150 mmHg.

## 2019-07-20 NOTE — Assessment & Plan Note (Signed)
I think we will hold off on doing further evaluations.

## 2019-07-20 NOTE — Telephone Encounter (Signed)
That is fine to take B12

## 2019-07-20 NOTE — Assessment & Plan Note (Signed)
Unfortunately, in part because of COVID-19 limitations, and his ongoing comorbidities, he is not eating very well, not maintaining weight.  Still losing weight.  Now 4 more pounds down.  At this point I am simply removing medications leaving only Imdur.  We discussed and signed a DNR form. ->  Next up will be MOST form. He has voiced a desire to avoid returning to the hospital.  No further testing.

## 2019-07-20 NOTE — Assessment & Plan Note (Signed)
RCA stent followed by PTCA.  Moderate LAD disease and small ostial OM disease.  Not really having any angina on current dose of Imdur.  With low blood pressures, no other medications would be available.  In the absence of active angina would not use Ranexa. He is on Imdur. Because of weakness and failure to thrive we stopped his statin. Also for weakness and fatigue we stopped beta-blocker.  Per patient request.  No plans for further ischemic evaluation with stress test or cardiac catheterization.  Unless he would have profound symptoms, we would not consider screening evaluation.

## 2019-07-20 NOTE — Assessment & Plan Note (Signed)
At this point time I am more worried about his nutrition.  I have not seen labs in some time.  We stopped statin a long time ago.  Per his wishes, we will not go back on statin.  He does not want take any other medicines.

## 2019-07-20 NOTE — Assessment & Plan Note (Signed)
At this point I am concerned of almost more severe malnutrition than moderate.  He has borderline failure to thrive.  We will need to look into potential Meals on Wheels or other options for them to obtain meals.  Needs proteins.  No concerns about what ever he eats.

## 2019-07-20 NOTE — Telephone Encounter (Signed)
Spoke to the pt and [name] and advised that [name] said it is ok to take B12 daily.  Nothing further needed.

## 2019-09-09 ENCOUNTER — Other Ambulatory Visit: Payer: Self-pay | Admitting: Adult Health

## 2019-10-01 ENCOUNTER — Emergency Department (HOSPITAL_COMMUNITY): Payer: Medicare Other

## 2019-10-01 ENCOUNTER — Inpatient Hospital Stay (HOSPITAL_COMMUNITY): Payer: Medicare Other

## 2019-10-01 ENCOUNTER — Inpatient Hospital Stay (HOSPITAL_COMMUNITY)
Admission: EM | Admit: 2019-10-01 | Discharge: 2019-10-08 | DRG: 246 | Disposition: A | Discharge status: Home Health | Payer: Medicare Other | Attending: Cardiovascular Disease | Admitting: Cardiovascular Disease

## 2019-10-01 ENCOUNTER — Other Ambulatory Visit: Payer: Self-pay

## 2019-10-01 ENCOUNTER — Encounter (HOSPITAL_COMMUNITY): Payer: Self-pay | Admitting: Cardiovascular Disease

## 2019-10-01 ENCOUNTER — Encounter (HOSPITAL_COMMUNITY)
Admission: EM | Disposition: A | Discharge status: Home Health | Payer: Self-pay | Source: Home / Self Care | Attending: Cardiovascular Disease

## 2019-10-01 DIAGNOSIS — E43 Unspecified severe protein-calorie malnutrition: Secondary | ICD-10-CM | POA: Diagnosis present

## 2019-10-01 DIAGNOSIS — Z515 Encounter for palliative care: Secondary | ICD-10-CM | POA: Diagnosis not present

## 2019-10-01 DIAGNOSIS — E782 Mixed hyperlipidemia: Secondary | ICD-10-CM | POA: Diagnosis not present

## 2019-10-01 DIAGNOSIS — I255 Ischemic cardiomyopathy: Secondary | ICD-10-CM | POA: Diagnosis not present

## 2019-10-01 DIAGNOSIS — E872 Acidosis: Secondary | ICD-10-CM | POA: Diagnosis present

## 2019-10-01 DIAGNOSIS — R1110 Vomiting, unspecified: Secondary | ICD-10-CM | POA: Diagnosis present

## 2019-10-01 DIAGNOSIS — Z955 Presence of coronary angioplasty implant and graft: Secondary | ICD-10-CM

## 2019-10-01 DIAGNOSIS — I2584 Coronary atherosclerosis due to calcified coronary lesion: Secondary | ICD-10-CM | POA: Diagnosis present

## 2019-10-01 DIAGNOSIS — Z885 Allergy status to narcotic agent status: Secondary | ICD-10-CM

## 2019-10-01 DIAGNOSIS — Z89022 Acquired absence of left finger(s): Secondary | ICD-10-CM

## 2019-10-01 DIAGNOSIS — I2109 ST elevation (STEMI) myocardial infarction involving other coronary artery of anterior wall: Secondary | ICD-10-CM

## 2019-10-01 DIAGNOSIS — Z7189 Other specified counseling: Secondary | ICD-10-CM | POA: Diagnosis not present

## 2019-10-01 DIAGNOSIS — I340 Nonrheumatic mitral (valve) insufficiency: Secondary | ICD-10-CM

## 2019-10-01 DIAGNOSIS — Z681 Body mass index (BMI) 19 or less, adult: Secondary | ICD-10-CM

## 2019-10-01 DIAGNOSIS — D638 Anemia in other chronic diseases classified elsewhere: Secondary | ICD-10-CM | POA: Diagnosis not present

## 2019-10-01 DIAGNOSIS — Z23 Encounter for immunization: Secondary | ICD-10-CM

## 2019-10-01 DIAGNOSIS — N184 Chronic kidney disease, stage 4 (severe): Secondary | ICD-10-CM | POA: Diagnosis present

## 2019-10-01 DIAGNOSIS — I130 Hypertensive heart and chronic kidney disease with heart failure and stage 1 through stage 4 chronic kidney disease, or unspecified chronic kidney disease: Secondary | ICD-10-CM | POA: Diagnosis present

## 2019-10-01 DIAGNOSIS — N171 Acute kidney failure with acute cortical necrosis: Secondary | ICD-10-CM | POA: Diagnosis not present

## 2019-10-01 DIAGNOSIS — Z66 Do not resuscitate: Secondary | ICD-10-CM | POA: Diagnosis present

## 2019-10-01 DIAGNOSIS — I2510 Atherosclerotic heart disease of native coronary artery without angina pectoris: Secondary | ICD-10-CM

## 2019-10-01 DIAGNOSIS — D539 Nutritional anemia, unspecified: Secondary | ICD-10-CM | POA: Diagnosis present

## 2019-10-01 DIAGNOSIS — F329 Major depressive disorder, single episode, unspecified: Secondary | ICD-10-CM | POA: Diagnosis present

## 2019-10-01 DIAGNOSIS — F0390 Unspecified dementia without behavioral disturbance: Secondary | ICD-10-CM | POA: Diagnosis present

## 2019-10-01 DIAGNOSIS — Z833 Family history of diabetes mellitus: Secondary | ICD-10-CM

## 2019-10-01 DIAGNOSIS — Z8249 Family history of ischemic heart disease and other diseases of the circulatory system: Secondary | ICD-10-CM

## 2019-10-01 DIAGNOSIS — I252 Old myocardial infarction: Secondary | ICD-10-CM | POA: Diagnosis not present

## 2019-10-01 DIAGNOSIS — N1832 Chronic kidney disease, stage 3b: Secondary | ICD-10-CM | POA: Diagnosis not present

## 2019-10-01 DIAGNOSIS — I2102 ST elevation (STEMI) myocardial infarction involving left anterior descending coronary artery: Secondary | ICD-10-CM | POA: Diagnosis not present

## 2019-10-01 DIAGNOSIS — I350 Nonrheumatic aortic (valve) stenosis: Secondary | ICD-10-CM

## 2019-10-01 DIAGNOSIS — I959 Hypotension, unspecified: Secondary | ICD-10-CM | POA: Diagnosis present

## 2019-10-01 DIAGNOSIS — Z20828 Contact with and (suspected) exposure to other viral communicable diseases: Secondary | ICD-10-CM | POA: Diagnosis present

## 2019-10-01 DIAGNOSIS — N179 Acute kidney failure, unspecified: Secondary | ICD-10-CM | POA: Diagnosis present

## 2019-10-01 DIAGNOSIS — J449 Chronic obstructive pulmonary disease, unspecified: Secondary | ICD-10-CM | POA: Diagnosis present

## 2019-10-01 DIAGNOSIS — I351 Nonrheumatic aortic (valve) insufficiency: Secondary | ICD-10-CM

## 2019-10-01 DIAGNOSIS — I2129 ST elevation (STEMI) myocardial infarction involving other sites: Secondary | ICD-10-CM

## 2019-10-01 DIAGNOSIS — T82855A Stenosis of coronary artery stent, initial encounter: Secondary | ICD-10-CM | POA: Diagnosis present

## 2019-10-01 DIAGNOSIS — N189 Chronic kidney disease, unspecified: Secondary | ICD-10-CM

## 2019-10-01 DIAGNOSIS — R630 Anorexia: Secondary | ICD-10-CM | POA: Diagnosis present

## 2019-10-01 DIAGNOSIS — I5021 Acute systolic (congestive) heart failure: Secondary | ICD-10-CM | POA: Diagnosis not present

## 2019-10-01 DIAGNOSIS — M1990 Unspecified osteoarthritis, unspecified site: Secondary | ICD-10-CM | POA: Diagnosis present

## 2019-10-01 DIAGNOSIS — E039 Hypothyroidism, unspecified: Secondary | ICD-10-CM | POA: Diagnosis present

## 2019-10-01 DIAGNOSIS — I10 Essential (primary) hypertension: Secondary | ICD-10-CM | POA: Diagnosis not present

## 2019-10-01 DIAGNOSIS — R079 Chest pain, unspecified: Secondary | ICD-10-CM | POA: Diagnosis not present

## 2019-10-01 DIAGNOSIS — Z79890 Hormone replacement therapy (postmenopausal): Secondary | ICD-10-CM

## 2019-10-01 DIAGNOSIS — I213 ST elevation (STEMI) myocardial infarction of unspecified site: Secondary | ICD-10-CM | POA: Diagnosis present

## 2019-10-01 DIAGNOSIS — I25119 Atherosclerotic heart disease of native coronary artery with unspecified angina pectoris: Secondary | ICD-10-CM | POA: Diagnosis present

## 2019-10-01 DIAGNOSIS — I779 Disorder of arteries and arterioles, unspecified: Secondary | ICD-10-CM | POA: Diagnosis present

## 2019-10-01 DIAGNOSIS — Z96659 Presence of unspecified artificial knee joint: Secondary | ICD-10-CM | POA: Diagnosis present

## 2019-10-01 DIAGNOSIS — F419 Anxiety disorder, unspecified: Secondary | ICD-10-CM | POA: Diagnosis present

## 2019-10-01 DIAGNOSIS — Z79899 Other long term (current) drug therapy: Secondary | ICD-10-CM

## 2019-10-01 DIAGNOSIS — Z87891 Personal history of nicotine dependence: Secondary | ICD-10-CM

## 2019-10-01 DIAGNOSIS — D649 Anemia, unspecified: Secondary | ICD-10-CM | POA: Diagnosis not present

## 2019-10-01 DIAGNOSIS — R0602 Shortness of breath: Secondary | ICD-10-CM | POA: Diagnosis not present

## 2019-10-01 DIAGNOSIS — I491 Atrial premature depolarization: Secondary | ICD-10-CM | POA: Diagnosis not present

## 2019-10-01 DIAGNOSIS — I080 Rheumatic disorders of both mitral and aortic valves: Secondary | ICD-10-CM | POA: Diagnosis present

## 2019-10-01 DIAGNOSIS — R001 Bradycardia, unspecified: Secondary | ICD-10-CM | POA: Diagnosis not present

## 2019-10-01 DIAGNOSIS — Z888 Allergy status to other drugs, medicaments and biological substances status: Secondary | ICD-10-CM

## 2019-10-01 HISTORY — DX: ST elevation (STEMI) myocardial infarction involving other coronary artery of anterior wall: I21.09

## 2019-10-01 HISTORY — PX: TRANSTHORACIC ECHOCARDIOGRAM: SHX275

## 2019-10-01 HISTORY — DX: Nonrheumatic aortic (valve) stenosis: I35.0

## 2019-10-01 HISTORY — PX: LEFT HEART CATH AND CORONARY ANGIOGRAPHY: CATH118249

## 2019-10-01 HISTORY — PX: CORONARY/GRAFT ACUTE MI REVASCULARIZATION: CATH118305

## 2019-10-01 LAB — BASIC METABOLIC PANEL
Anion gap: 16 — ABNORMAL HIGH (ref 5–15)
BUN: 46 mg/dL — ABNORMAL HIGH (ref 8–23)
CO2: 11 mmol/L — ABNORMAL LOW (ref 22–32)
Calcium: 9.1 mg/dL (ref 8.9–10.3)
Chloride: 113 mmol/L — ABNORMAL HIGH (ref 98–111)
Creatinine, Ser: 2.35 mg/dL — ABNORMAL HIGH (ref 0.61–1.24)
GFR calc Af Amer: 28 mL/min — ABNORMAL LOW (ref >60)
GFR calc non Af Amer: 24 mL/min — ABNORMAL LOW (ref >60)
Glucose, Bld: 188 mg/dL — ABNORMAL HIGH (ref 70–99)
Potassium: 5 mmol/L (ref 3.5–5.1)
Sodium: 140 mmol/L (ref 135–145)

## 2019-10-01 LAB — COMPREHENSIVE METABOLIC PANEL
ALT: 12 U/L (ref 0–44)
AST: 17 U/L (ref 15–41)
Albumin: 3.8 g/dL (ref 3.5–5.0)
Alkaline Phosphatase: 91 U/L (ref 38–126)
Anion gap: 14 (ref 5–15)
BUN: 50 mg/dL — ABNORMAL HIGH (ref 8–23)
CO2: 14 mmol/L — ABNORMAL LOW (ref 22–32)
Calcium: 9.7 mg/dL (ref 8.9–10.3)
Chloride: 113 mmol/L — ABNORMAL HIGH (ref 98–111)
Creatinine, Ser: 2.39 mg/dL — ABNORMAL HIGH (ref 0.61–1.24)
GFR calc Af Amer: 27 mL/min — ABNORMAL LOW (ref >60)
GFR calc non Af Amer: 23 mL/min — ABNORMAL LOW (ref >60)
Glucose, Bld: 145 mg/dL — ABNORMAL HIGH (ref 70–99)
Potassium: 4.9 mmol/L (ref 3.5–5.1)
Sodium: 141 mmol/L (ref 135–145)
Total Bilirubin: 0.7 mg/dL (ref 0.3–1.2)
Total Protein: 6.7 g/dL (ref 6.5–8.1)

## 2019-10-01 LAB — POCT I-STAT EG7
Acid-base deficit: 10 mmol/L — ABNORMAL HIGH (ref 0.0–2.0)
Bicarbonate: 14.9 mmol/L — ABNORMAL LOW (ref 20.0–28.0)
Calcium, Ion: 1.28 mmol/L (ref 1.15–1.40)
HCT: 34 % — ABNORMAL LOW (ref 39.0–52.0)
Hemoglobin: 11.6 g/dL — ABNORMAL LOW (ref 13.0–17.0)
O2 Saturation: 99 %
Potassium: 4.7 mmol/L (ref 3.5–5.1)
Sodium: 141 mmol/L (ref 135–145)
TCO2: 16 mmol/L — ABNORMAL LOW (ref 22–32)
pCO2, Ven: 30.5 mmHg — ABNORMAL LOW (ref 44.0–60.0)
pH, Ven: 7.297 (ref 7.250–7.430)
pO2, Ven: 135 mmHg — ABNORMAL HIGH (ref 32.0–45.0)

## 2019-10-01 LAB — HEPATIC FUNCTION PANEL
ALT: 17 U/L (ref 0–44)
AST: 59 U/L — ABNORMAL HIGH (ref 15–41)
Albumin: 3.4 g/dL — ABNORMAL LOW (ref 3.5–5.0)
Alkaline Phosphatase: 91 U/L (ref 38–126)
Bilirubin, Direct: 0.2 mg/dL (ref 0.0–0.2)
Indirect Bilirubin: 0.9 mg/dL (ref 0.3–0.9)
Total Bilirubin: 1.1 mg/dL (ref 0.3–1.2)
Total Protein: 6.2 g/dL — ABNORMAL LOW (ref 6.5–8.1)

## 2019-10-01 LAB — CBC
HCT: 35.4 % — ABNORMAL LOW (ref 39.0–52.0)
Hemoglobin: 12.1 g/dL — ABNORMAL LOW (ref 13.0–17.0)
MCH: 34.6 pg — ABNORMAL HIGH (ref 26.0–34.0)
MCHC: 34.2 g/dL (ref 30.0–36.0)
MCV: 101.1 fL — ABNORMAL HIGH (ref 80.0–100.0)
Platelets: 102 10*3/uL — ABNORMAL LOW (ref 150–400)
RBC: 3.5 MIL/uL — ABNORMAL LOW (ref 4.22–5.81)
RDW: 14.3 % (ref 11.5–15.5)
WBC: 6.8 10*3/uL (ref 4.0–10.5)
nRBC: 0 % (ref 0.0–0.2)

## 2019-10-01 LAB — TROPONIN I (HIGH SENSITIVITY)
Troponin I (High Sensitivity): 34 ng/L — ABNORMAL HIGH (ref <18)
Troponin I (High Sensitivity): 7701 ng/L (ref <18)
Troponin I (High Sensitivity): >27000 ng/L (ref <18)
Troponin I (High Sensitivity): >27000 ng/L (ref <18)

## 2019-10-01 LAB — LIPID PANEL
Cholesterol: 147 mg/dL (ref 0–200)
HDL: 60 mg/dL (ref >40)
LDL Cholesterol: 64 mg/dL (ref 0–99)
Total CHOL/HDL Ratio: 2.5 RATIO
Triglycerides: 114 mg/dL (ref <150)
VLDL: 23 mg/dL (ref 0–40)

## 2019-10-01 LAB — ECHOCARDIOGRAM COMPLETE
Height: 67 in
Weight: 1424 oz

## 2019-10-01 LAB — LACTIC ACID, PLASMA: Lactic Acid, Venous: 3.2 mmol/L (ref 0.5–1.9)

## 2019-10-01 LAB — CBG MONITORING, ED: Glucose-Capillary: 128 mg/dL — ABNORMAL HIGH (ref 70–99)

## 2019-10-01 LAB — POCT ACTIVATED CLOTTING TIME: Activated Clotting Time: 307 seconds

## 2019-10-01 LAB — APTT: aPTT: 28 seconds (ref 24–36)

## 2019-10-01 LAB — MRSA PCR SCREENING: MRSA by PCR: NEGATIVE

## 2019-10-01 LAB — SARS CORONAVIRUS 2 BY RT PCR (HOSPITAL ORDER, PERFORMED IN ~~LOC~~ HOSPITAL LAB): SARS Coronavirus 2: NEGATIVE

## 2019-10-01 LAB — TSH: TSH: 5.576 u[IU]/mL — ABNORMAL HIGH (ref 0.350–4.500)

## 2019-10-01 SURGERY — CORONARY/GRAFT ACUTE MI REVASCULARIZATION
Anesthesia: LOCAL

## 2019-10-01 MED ORDER — TICAGRELOR 90 MG PO TABS
90.0000 mg | ORAL_TABLET | Freq: Two times a day (BID) | ORAL | Status: DC
  Administered 2019-10-01 – 2019-10-06 (×10): 90 mg via ORAL
  Filled 2019-10-01 (×10): qty 1

## 2019-10-01 MED ORDER — FENTANYL CITRATE (PF) 100 MCG/2ML IJ SOLN
50.0000 ug | Freq: Once | INTRAMUSCULAR | Status: AC
  Administered 2019-10-01: 50 ug via INTRAVENOUS
  Filled 2019-10-01: qty 2

## 2019-10-01 MED ORDER — HEPARIN SODIUM (PORCINE) 1000 UNIT/ML IJ SOLN
INTRAMUSCULAR | Status: AC
  Filled 2019-10-01: qty 1

## 2019-10-01 MED ORDER — LEVOTHYROXINE SODIUM 25 MCG PO TABS
25.0000 ug | ORAL_TABLET | Freq: Every day | ORAL | Status: DC
  Administered 2019-10-01 – 2019-10-08 (×8): 25 ug via ORAL
  Filled 2019-10-01 (×8): qty 1

## 2019-10-01 MED ORDER — SODIUM CHLORIDE 0.9% FLUSH
3.0000 mL | Freq: Two times a day (BID) | INTRAVENOUS | Status: DC
  Administered 2019-10-01 – 2019-10-08 (×11): 3 mL via INTRAVENOUS

## 2019-10-01 MED ORDER — ORAL CARE MOUTH RINSE
15.0000 mL | Freq: Two times a day (BID) | OROMUCOSAL | Status: DC
  Administered 2019-10-01 – 2019-10-07 (×11): 15 mL via OROMUCOSAL

## 2019-10-01 MED ORDER — ONDANSETRON HCL 4 MG/2ML IJ SOLN
4.0000 mg | Freq: Four times a day (QID) | INTRAMUSCULAR | Status: DC | PRN
  Administered 2019-10-01 (×2): 4 mg via INTRAVENOUS
  Filled 2019-10-01 (×2): qty 2

## 2019-10-01 MED ORDER — HEPARIN SODIUM (PORCINE) 5000 UNIT/ML IJ SOLN
INTRAMUSCULAR | Status: AC
  Filled 2019-10-01: qty 1

## 2019-10-01 MED ORDER — TICAGRELOR 90 MG PO TABS: 90.0000 mg | ORAL_TABLET | Freq: Two times a day (BID) | ORAL | Status: DC

## 2019-10-01 MED ORDER — VERAPAMIL HCL 2.5 MG/ML IV SOLN
INTRAVENOUS | Status: DC | PRN
  Administered 2019-10-01: 10 mL via INTRA_ARTERIAL

## 2019-10-01 MED ORDER — LIDOCAINE HCL (PF) 1 % IJ SOLN
INTRAMUSCULAR | Status: AC
  Filled 2019-10-01: qty 30

## 2019-10-01 MED ORDER — SODIUM CHLORIDE 0.9 % IV SOLN
INTRAVENOUS | Status: DC
  Administered 2019-10-01: 10 mL/h via INTRAVENOUS

## 2019-10-01 MED ORDER — ASPIRIN EC 81 MG PO TBEC
81.0000 mg | DELAYED_RELEASE_TABLET | Freq: Every day | ORAL | Status: DC
  Administered 2019-10-02 – 2019-10-07 (×6): 81 mg via ORAL
  Filled 2019-10-01 (×5): qty 1

## 2019-10-01 MED ORDER — SODIUM CHLORIDE 0.9 % IV SOLN
INTRAVENOUS | Status: AC | PRN
  Administered 2019-10-01: 10 mL/h via INTRAVENOUS

## 2019-10-01 MED ORDER — ONDANSETRON HCL 4 MG/2ML IJ SOLN
INTRAMUSCULAR | Status: DC | PRN
  Administered 2019-10-01: 4 mg via INTRAVENOUS

## 2019-10-01 MED ORDER — ISOSORBIDE MONONITRATE ER 60 MG PO TB24: 60.0000 mg | ORAL_TABLET | Freq: Every day | ORAL | Status: DC

## 2019-10-01 MED ORDER — INFLUENZA VAC A&B SA ADJ QUAD 0.5 ML IM PRSY
0.5000 mL | PREFILLED_SYRINGE | INTRAMUSCULAR | Status: DC | PRN
  Filled 2019-10-01: qty 0.5

## 2019-10-01 MED ORDER — NITROGLYCERIN 1 MG/10 ML FOR IR/CATH LAB
INTRA_ARTERIAL | Status: AC
  Filled 2019-10-01: qty 10

## 2019-10-01 MED ORDER — DONEPEZIL HCL 10 MG PO TABS
10.0000 mg | ORAL_TABLET | Freq: Every day | ORAL | Status: DC
  Administered 2019-10-01 – 2019-10-07 (×7): 10 mg via ORAL
  Filled 2019-10-01 (×8): qty 1

## 2019-10-01 MED ORDER — TICAGRELOR 90 MG PO TABS
180.0000 mg | ORAL_TABLET | Freq: Once | ORAL | Status: AC
  Administered 2019-10-01: 07:00:00 180 mg via ORAL
  Filled 2019-10-01: qty 2

## 2019-10-01 MED ORDER — ONDANSETRON HCL 4 MG/2ML IJ SOLN
INTRAMUSCULAR | Status: AC
  Filled 2019-10-01: qty 2

## 2019-10-01 MED ORDER — NITROGLYCERIN 0.4 MG SL SUBL
0.4000 mg | SUBLINGUAL_TABLET | SUBLINGUAL | Status: DC | PRN
  Administered 2019-10-01 (×2): 0.4 mg via SUBLINGUAL
  Filled 2019-10-01: qty 1

## 2019-10-01 MED ORDER — FENTANYL CITRATE (PF) 100 MCG/2ML IJ SOLN
INTRAMUSCULAR | Status: AC
  Filled 2019-10-01: qty 2

## 2019-10-01 MED ORDER — LIDOCAINE HCL (PF) 1 % IJ SOLN
INTRAMUSCULAR | Status: DC | PRN
  Administered 2019-10-01: 5 mL via SUBCUTANEOUS

## 2019-10-01 MED ORDER — TIROFIBAN HCL IN NACL 5-0.9 MG/100ML-% IV SOLN
INTRAVENOUS | Status: AC | PRN
  Administered 2019-10-01: 0.075 ug/kg/min via INTRAVENOUS

## 2019-10-01 MED ORDER — HYDRALAZINE HCL 20 MG/ML IJ SOLN
10.0000 mg | INTRAMUSCULAR | Status: AC | PRN
  Administered 2019-10-01: 09:00:00 10 mg via INTRAVENOUS
  Filled 2019-10-01: qty 1

## 2019-10-01 MED ORDER — SODIUM CHLORIDE 0.9 % IV SOLN: 250.0000 mL | INTRAVENOUS | Status: DC | PRN

## 2019-10-01 MED ORDER — ONDANSETRON HCL 4 MG/2ML IJ SOLN
4.0000 mg | Freq: Once | INTRAMUSCULAR | Status: AC
  Administered 2019-10-01: 4 mg via INTRAVENOUS

## 2019-10-01 MED ORDER — ENSURE ENLIVE PO LIQD
237.0000 mL | Freq: Three times a day (TID) | ORAL | Status: DC
  Administered 2019-10-01 – 2019-10-08 (×15): 237 mL via ORAL

## 2019-10-01 MED ORDER — SODIUM CHLORIDE 0.9% FLUSH: 3.0000 mL | INTRAVENOUS | Status: DC | PRN

## 2019-10-01 MED ORDER — HEPARIN (PORCINE) IN NACL 1000-0.9 UT/500ML-% IV SOLN
INTRAVENOUS | Status: AC
  Filled 2019-10-01: qty 1000

## 2019-10-01 MED ORDER — TICAGRELOR 90 MG PO TABS
ORAL_TABLET | ORAL | Status: DC | PRN
  Administered 2019-10-01: 180 mg via ORAL

## 2019-10-01 MED ORDER — LATANOPROST 0.005 % OP SOLN
1.0000 [drp] | Freq: Every day | OPHTHALMIC | Status: DC
  Administered 2019-10-01 – 2019-10-07 (×7): 1 [drp] via OPHTHALMIC
  Filled 2019-10-01: qty 2.5

## 2019-10-01 MED ORDER — IOHEXOL 350 MG/ML SOLN
INTRAVENOUS | Status: DC | PRN
  Administered 2019-10-01: 200 mL

## 2019-10-01 MED ORDER — TIROFIBAN HCL IN NACL 5-0.9 MG/100ML-% IV SOLN
INTRAVENOUS | Status: AC
  Filled 2019-10-01: qty 100

## 2019-10-01 MED ORDER — LABETALOL HCL 5 MG/ML IV SOLN
10.0000 mg | INTRAVENOUS | Status: DC | PRN
  Administered 2019-10-01: 07:00:00 10 mg via INTRAVENOUS
  Filled 2019-10-01: qty 4

## 2019-10-01 MED ORDER — ISOSORBIDE MONONITRATE ER 60 MG PO TB24
60.0000 mg | ORAL_TABLET | Freq: Every day | ORAL | Status: DC
  Administered 2019-10-01 – 2019-10-08 (×9): 60 mg via ORAL
  Filled 2019-10-01 (×9): qty 1

## 2019-10-01 MED ORDER — MORPHINE SULFATE (PF) 2 MG/ML IV SOLN: 2.0000 mg | INTRAVENOUS | Status: DC | PRN

## 2019-10-01 MED ORDER — TICAGRELOR 90 MG PO TABS
ORAL_TABLET | ORAL | Status: AC
  Filled 2019-10-01: qty 2

## 2019-10-01 MED ORDER — HEPARIN SODIUM (PORCINE) 5000 UNIT/ML IJ SOLN
60.0000 [IU]/kg | Freq: Once | INTRAMUSCULAR | Status: AC
  Administered 2019-10-01: 2400 [IU] via INTRAVENOUS

## 2019-10-01 MED ORDER — TIROFIBAN (AGGRASTAT) BOLUS VIA INFUSION
INTRAVENOUS | Status: DC | PRN
  Administered 2019-10-01: 1010 ug via INTRAVENOUS

## 2019-10-01 MED ORDER — HEPARIN (PORCINE) IN NACL 1000-0.9 UT/500ML-% IV SOLN
INTRAVENOUS | Status: DC | PRN
  Administered 2019-10-01 (×2): 500 mL

## 2019-10-01 MED ORDER — HEPARIN SODIUM (PORCINE) 1000 UNIT/ML IJ SOLN
INTRAMUSCULAR | Status: DC | PRN
  Administered 2019-10-01: 8000 [IU] via INTRAVENOUS

## 2019-10-01 MED ORDER — VERAPAMIL HCL 2.5 MG/ML IV SOLN
INTRAVENOUS | Status: AC
  Filled 2019-10-01: qty 2

## 2019-10-01 MED ORDER — ACETAMINOPHEN 325 MG PO TABS: 650.0000 mg | ORAL_TABLET | ORAL | Status: DC | PRN

## 2019-10-01 MED ORDER — SODIUM CHLORIDE 0.9 % IV SOLN
INTRAVENOUS | Status: AC
  Administered 2019-10-01: 06:00:00 via INTRAVENOUS

## 2019-10-01 MED ORDER — FENTANYL CITRATE (PF) 100 MCG/2ML IJ SOLN
INTRAMUSCULAR | Status: DC | PRN
  Administered 2019-10-01: 50 ug via INTRAVENOUS

## 2019-10-01 MED ORDER — SODIUM BICARBONATE 650 MG PO TABS
650.0000 mg | ORAL_TABLET | Freq: Four times a day (QID) | ORAL | Status: DC
  Administered 2019-10-01 – 2019-10-08 (×31): 650 mg via ORAL
  Filled 2019-10-01 (×31): qty 1

## 2019-10-01 MED ORDER — CHLORHEXIDINE GLUCONATE CLOTH 2 % EX PADS
6.0000 | MEDICATED_PAD | Freq: Every day | CUTANEOUS | Status: DC
  Administered 2019-10-02 – 2019-10-06 (×5): 6 via TOPICAL

## 2019-10-01 MED ORDER — TIROFIBAN HCL IN NACL 5-0.9 MG/100ML-% IV SOLN
0.0750 ug/kg/min | INTRAVENOUS | Status: DC
  Administered 2019-10-01 (×2): 0.075 ug/kg/min via INTRAVENOUS
  Filled 2019-10-01: qty 100

## 2019-10-01 SURGICAL SUPPLY — 15 items
BALLN SAPPHIRE 2.0X12 (BALLOONS) ×2
BALLOON SAPPHIRE 2.0X12 (BALLOONS) IMPLANT
CATH 5FR JL3.5 JR4 ANG PIG MP (CATHETERS) ×1 IMPLANT
CATH INFINITI 5FR AL1 (CATHETERS) ×1 IMPLANT
CATH VISTA GUIDE 6FRXBLAD3.5SH (CATHETERS) ×1 IMPLANT
DEVICE RAD TR BAND REGULAR (VASCULAR PRODUCTS) ×1 IMPLANT
GLIDESHEATH SLEND SS 6F .021 (SHEATH) ×1 IMPLANT
GUIDEWIRE INQWIRE 1.5J.035X260 (WIRE) IMPLANT
INQWIRE 1.5J .035X260CM (WIRE) ×2
KIT ENCORE 26 ADVANTAGE (KITS) ×1 IMPLANT
KIT HEART LEFT (KITS) ×2 IMPLANT
PACK CARDIAC CATHETERIZATION (CUSTOM PROCEDURE TRAY) ×2 IMPLANT
TRANSDUCER W/STOPCOCK (MISCELLANEOUS) ×2 IMPLANT
TUBING CIL FLEX 10 FLL-RA (TUBING) ×2 IMPLANT
WIRE COUGAR XT STRL 190CM (WIRE) ×1 IMPLANT

## 2019-10-01 NOTE — H&P (Addendum)
Cardiology Admission History and Physical:   Patient ID: Nicholas Porter MRN: [id number]; DOB: 12-Feb-1930   Admission date: 10/01/2019  Primary Care Provider: Dorothyann Peng, NP Primary Cardiologist: Glenetta Hew, MD  Primary Electrophysiologist:  None   Chief Complaint:  Chest pain  Patient Profile:   Nicholas Porter is a 85 y.o. male with history of inferior STEMI (2014), carotid disease s/p CEA, who presents with chest pain, found to have lateral STEMI.   History of Present Illness:   Nicholas Porter reported feeling generally unwell during the day prior to presentation. Overnight, he awoke to use the bathroom and experienced sudden onset SSCP associated with nausea and emesis. Pain much more severe than prior to his last presentation with ACS. Wife activated EMS and in the [facility] patient found to have ST elevations in I, AVL, with reciprocal depressions in inferior leads. Patient taken the [facility] cath lab were 99% D1 lesion with TIMI 1 flow discovered. He is now s/p PCI of D1.   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   S/p ulcer surgury 1963 with vagotomy, partial gastrectomy with Bilroth I gastroenterostomy    CAD S/P percutaneous coronary angioplasty - DES in RCA with PTCA for ISR; Moderate mLAD, 80% ostial OM2 stable 05/22/2008   Qualifier: Diagnosis of  By: Linda Hedges MD, Heinz Knuckles    CKD (chronic kidney disease) stage 3, GFR 30-59 ml/min (HCC)    COPD (chronic obstructive pulmonary disease) ([place])    Dementia ([place]) 05/13/2019   Depression    Dyspnea    with activity    Essential hypertension 06/30/2007   Qualifier: Diagnosis of  By: Cori Razor RN, Mikal Plane     Eye abnormalities    injections in eyes 12/2017   H/O Inferior  STEMI (09/2013) emergent PCI with Promus DES to RCA (3.0 mm  x 28 mm - 3.2 mm) 10/05/2013   Hypothyroidism    Klebsiella sepsis ([place]) 04/02/2017   Left-sided carotid artery disease -- s/p CEA 04/06/2006   S/p Left CEA    Mitral regurgitation - onexam 09/03/2016    Past Surgical History:  Procedure Laterality Date   AMPUTATION     left index finger -tramatic   BILIARY STENT PLACEMENT N/A 01/06/2019   Procedure: BILIARY STENT PLACEMENT;  Surgeon: Clarene Essex, MD;  Location: [facility];  Service: Endoscopy;  Laterality: N/A;   CARDIAC CATHETERIZATION  November 2011   40% mid LAD, 50% proximal RCA, 30-40% distal RCA (DOMINANT RCA with wraparound PDA & 2 large PLBs), 60-70% ostial OM1. No change from 2001. Medical therapy   CAROTID ENDARTERECTOMY Left 2007   Dr.Hayes: 08/2016 Dopplers: Stable less than 40% right internal carotid stenosis and stable moderate 40-60% left internal carotid stenosis. Patent vertebral and subclavian arteries.   CARPAL TUNNEL RELEASE     CHOLECYSTECTOMY N/A 01/02/2019   Procedure: LAPAROSCOPIC CHOLECYSTECTOMY;  Surgeon: Michael Boston, MD;  Location: [facility];  Service: General;  Laterality: N/A;   CORNEAL TRANSPLANT     CORONARY ANGIOPLASTY WITH STENT PLACEMENT  09/2013; 05/2014    d) Promus DES 3.0 mm x 28 mm (3.2 mm)  to RCA with STEMI; residual 70% mid-distal LAD, OM 270-80%. Medical management; b) PTCA of 75 % ISR , LAD  lesion noted as ~40%   ERCP N/A 01/06/2019   Procedure: ENDOSCOPIC RETROGRADE CHOLANGIOPANCREATOGRAPHY (ERCP);  Surgeon: Clarene Essex, MD;  Location: [facility];  Service: Endoscopy;  Laterality: N/A;   ERCP N/A 05/28/2019   Procedure: ENDOSCOPIC RETROGRADE CHOLANGIOPANCREATOGRAPHY (ERCP);  Surgeon: Clarene Essex, MD;  Location: [place];  Service: Endoscopy;  Laterality: N/A;   FOOT SURGERY     left   HERNIA REPAIR     LEFT HEART CATHETERIZATION WITH CORONARY ANGIOGRAM N/A 10/05/2013   Procedure: LEFT HEART CATHETERIZATION WITH  CORONARY ANGIOGRAM;  Surgeon: Peter M Martinique, MD;  Location[facility] C[facility] LAB;  Service: Cardiovascular;  RCA 100% - PCI,CxOM stable, LAD ~70% mid;   LEFT HEART CATHETERIZATION WITH CORONARY ANGIOGRAM N/A 06/08/2014   Procedure: LEFT HEART CATHETERIZATION WITH CORONARY ANGIOGRAM;  Surgeon: Troy Sine, MD;  Location: [facility] CATH LAB;  Service: Cardiovascular;  UA 6/'15: 75% ISR in RCA - PTCA, LAD lesion ~40%, CxOM stable.     NM MYOVIEW LTD  03/2017    - Elevatred Troponin in setting of Sepsis -- DEMAND ISCHEMIA.  Normal perfusion. LVEF 51% with normal wall motion. This is a low risk study.   PARTIAL GASTRECTOMY     PERCUTANEOUS CORONARY INTERVENTION-BALLOON ONLY  06/08/2014   Procedure: PERCUTANEOUS CORONARY INTERVENTION-BALLOON ONLY;  Surgeon: Troy Sine, MD;  Location: [facility] CATH LAB;  Service: Cardiovascular;;   REMOVAL OF STONES  05/28/2019   Procedure: REMOVAL OF STONES;  Surgeon: Clarene Essex, MD;  Location: [place];  Service: Endoscopy;;   ROTATOR CUFF REPAIR     left   SHOULDER ARTHROSCOPY WITH ROTATOR CUFF REPAIR AND SUBACROMIAL DECOMPRESSION Right 07/01/2013   Procedure: RIGHT SHOULDER ARTHROSCOPY WITH  SUBACROMIAL DECOMPRESSION/DISTAL CLAVICLE RESECTION/ROTATOR CUFF REPAIR ;  Surgeon: Marin Shutter, MD;  Location: [place];  Service: Orthopedics;  Laterality: Right;   SPHINCTEROTOMY  01/06/2019   Procedure: SPHINCTEROTOMY;  Surgeon: Clarene Essex, MD;  [facility];  Service: Endoscopy;;   [place]   cervical laminectomy   STENT REMOVAL  05/28/2019   Procedure: STENT REMOVAL;  Surgeon: Clarene Essex, MD;  Location: [place];  Service: Endoscopy;;   TOTAL KNEE ARTHROPLASTY     TRANSTHORACIC ECHOCARDIOGRAM  09/'17; 4/'18; 1/'20   a) Normal EF 55-60%. Mild to moderate AS with mod AI. Mild MR.;; b) Mild global reduction in LV systolic function: [id number]; grade 1 DD - high LVEDP. Mild AS/AI/TR. Mild-Mod MR; moderately elevated PAP.;; c) TTE 12/2018: EF 55-60%. No RWMA.  Gr1 DD. Mod AS with mild AI. Severe LA dilation.   VAGOTOMY     partial gastrectomy     Medications Prior to Admission: Prior to Admission medications   Medication Sig Start Date End Date Taking? Authorizing Provider  acetaminophen (TYLENOL) 650 MG CR tablet Take 1,300 mg by mouth every 8 (eight) hours as needed for pain.    [provider]  brinzolamide (AZOPT) 1 % ophthalmic suspension Place 1 drop into the left eye every 12 (twelve) hours.     [provider]  donepezil (ARICEPT) 10 MG tablet Take 1 tablet (10 mg total) by mouth at bedtime. 05/13/19   Kathrynn Ducking, MD  hydrALAZINE (APRESOLINE) 50 MG tablet CHECK BLOOD PRESSURE 2  TO 3 TIMES A DAY ,  IF  BLOOD PRESSURE IS GREATER THAN 150 mmHg TAKE A TABLET AS NEEDED.. 07/19/19   Leonie Man, MD  isosorbide mononitrate (IMDUR) 60 MG 24 hr tablet TAKE 1 TABLET(60 MG) BY MOUTH DAILY 09/14/19  Nafziger, [name], NP  levothyroxine (SYNTHROID) 25 MCG tablet Take 1 tablet (25 mcg total) by mouth daily before breakfast. 04/27/19   Nafziger, [name], NP  Multiple Vitamin (MULTIVITAMIN WITH MINERALS) TABS tablet Take 1 tablet by mouth daily. 01/12/19   Georgette Shell, MD  nitroGLYCERIN (NITROSTAT) 0.4 MG SL tablet DISSOLVE 1 TABLET UNDER THE TONGUE EVERY 5 MINUTES AS NEEDED FOR CHEST PAIN 08/21/17   Leonie Man, MD  TRAVATAN Z 0.004 % SOLN ophthalmic solution Place 1 drop into both eyes at bedtime.  04/20/13   [provider]     Allergies:    Allergies  Allergen Reactions   Amlodipine Besylate Hives   Lipitor [Atorvastatin] Itching   Naproxen Diarrhea   Oxycodone-Acetaminophen Itching   Isosorbide Rash    Social History:   Social History   Socioeconomic History   Marital status: Married    Spouse name: Not on file   Number of children: Not on file   Years of education: Not on file   Highest education level: Not on file  Occupational History    Employer: RETIRED    Comment: Retired    Scientist, product/process development strain: Not on file   Food insecurity    Worry: Not on file    Inability: Not on Lexicographer needs    Medical: Not on file    Non-medical: Not on file  Tobacco Use   Smoking status: Former Smoker    Years: 15.00    Types: Cigarettes    Quit date: 12/23/1964    Years since quitting: 54.8   Smokeless tobacco: Never Used  Substance and Sexual Activity   Alcohol use: No   Drug use: No   Sexual activity: Not on file  Lifestyle   Physical activity    Days per week: Not on file    Minutes per session: Not on file   Stress: Not on file  Relationships   Social connections    Talks on phone: Not on file    Gets together: Not on file    Attends religious service: Not on file    Active member of club or organization: Not on file    Attends meetings of clubs or organizations: Not on file    Relationship status: Not on file   Intimate partner violence    Fear of current or ex partner: Not on file    Emotionally abused: Not on file    Physically abused: Not on file    Forced sexual activity: Not on file  Other Topics Concern   Not on file  Social History Narrative   He is married with 2 children. He lives with his wife.   Very active at home, prior to a STEMI, he could walk several miles without discomfort. Prior to his MI, he would use his push mower to mow his entire lawn.   He is a former smoker and quit back in 1966. He does not drink alcohol      CODE STATUS: DO NOT RESUSCITATE    Family History:   The patient's family history includes Diabetes in his sister; Heart disease in his sister.    Review of Systems: [y] = yes, [ ]  = no     General: Weight gain [ ] ; Weight loss [ ] ; Anorexia [ ] ; Fatigue [ ] ; Fever [ ] ; Chills [ ] ; Weakness [ ]    Cardiac: Chest pain/pressure [ y]; Resting SOB [ ] ; Exertional SOB [ ] ;  Orthopnea [ ] ; Pedal Edema [ ] ; Palpitations [ ] ; Syncope [ ] ; Presyncope [ ] ; Paroxysmal nocturnal dyspnea[  ]   Pulmonary: Cough [ ] ; Wheezing[ ] ; Hemoptysis[ ] ; Sputum [ ] ; Snoring [ ]    GI: Vomiting[y ]; Dysphagia[ ] ; Melena[ ] ; Hematochezia [ ] ; Heartburn[ ] ; Abdominal pain [ ] ; Constipation [ ] ; Diarrhea [ ] ; BRBPR [ ]    GU: Hematuria[ ] ; Dysuria [ ] ; Nocturia[ ]    Vascular: Pain in legs with walking [ ] ; Pain in feet with lying flat [ ] ; Non-healing sores [ ] ; Stroke [ ] ; TIA [ ] ; Slurred speech [ ] ;   Neuro: Headaches[ ] ; Vertigo[ ] ; Seizures[ ] ; Paresthesias[ ] ;Blurred vision [ ] ; Diplopia [ ] ; Vision changes [ ]    Ortho/Skin: Arthritis [ ] ; Joint pain [ ] ; Muscle pain [ ] ; Joint swelling [ ] ; Back Pain [ ] ; Rash [ ]    Psych: Depression[ ] ; Anxiety[ ]    Heme: Bleeding problems [ ] ; Clotting disorders [ ] ; Anemia [ ]    Endocrine: Diabetes [ ] ; Thyroid dysfunction[ ]   Physical Exam/Data:   Vitals:   10/01/19 0420 10/01/19 0422 10/01/19 0425 10/01/19 0445  BP: (!) 182/77 (!) 192/79 (!) 198/81   Pulse: 70 66 60   Resp: (!) 21 15 (!) 23   Temp:      TempSrc:      SpO2: 100% 100% 100% 100%  Weight:      Height:       No intake or output data in the 24 hours ending 10/01/19 0457 Filed Weights   10/01/19 0316  Weight: 40.4 kg   Body mass index is 13.94 kg/m.  General:  Cachetic elderly man, lying in bed very uncomfortable.  HEENT: normal Lymph: no adenopathy Neck: no JVD Endocrine:  No thryomegaly Vascular: No carotid bruits; FA pulses 2+ bilaterally without bruits  Cardiac:  normal S1, S2; RRR; no murmur Lungs:  clear to auscultation bilaterally, no wheezing, rhonchi or rales  Abd: soft, nontender, no hepatomegaly  Ext: no LE edema Musculoskeletal:  No deformities, BUE and BLE strength normal and equal Skin: warm and dry  Neuro:  CNs 2-12 intact, no focal abnormalities noted Psych:  Normal affect   EKG:  The ECG that was done was personally reviewed and demonstrates SR with PACs. There are STE in I, AVL  With reciprocal inferior depressions.   Relevant CV  Studies: TTE 12/2018: - Left ventricle: The cavity size was normal. Systolic function was   normal. The estimated ejection fraction was in the range of 55%   to 60%. Wall motion was normal; there were no regional wall   motion abnormalities. Doppler parameters are consistent with   abnormal left ventricular relaxation (grade 1 diastolic   dysfunction). Doppler parameters are consistent with high   ventricular filling pressure. - Aortic valve: There was moderate stenosis. There was mild   regurgitation. Peak velocity (S): 281 cm/s. Mean gradient (S): 21   mm Hg. Valve area (VTI): 1.22 cm^2. Valve area (Vmax): 1.21 cm^2.   Valve area (Vmean): 1.05 cm^2. - Mitral valve: Transvalvular velocity was within the normal range.   There was no evidence for stenosis. There was trivial   regurgitation. - Left atrium: The atrium was severely dilated. - Right ventricle: The cavity size was normal. Wall thickness was   normal. Systolic function was normal. - Atrial septum: No defect or patent foramen ovale was identified. - Tricuspid valve: There was mild regurgitation. - Pulmonary arteries: Systolic pressure was within  the normal   range. PA peak pressure: 27 mm Hg (S).  Laboratory Data:  Chemistry Recent Labs  Lab 10/01/19 0326 10/01/19 0409  NA 141 141  K 4.9 4.7  CL 113*  --   CO2 14*  --   GLUCOSE 145*  --   BUN 50*  --   CREATININE 2.39*  --   CALCIUM 9.7  --   GFRNONAA 23*  --   GFRAA 27*  --   ANIONGAP 14  --     Recent Labs  Lab 10/01/19 0326  PROT 6.7  ALBUMIN 3.8  AST 17  ALT 12  ALKPHOS 91  BILITOT 0.7   Hematology Recent Labs  Lab 10/01/19 0319 10/01/19 0409  WBC 6.8  --   RBC 3.50*  --   HGB 12.1* 11.6*  HCT 35.4* 34.0*  MCV 101.1*  --   MCH 34.6*  --   MCHC 34.2  --   RDW 14.3  --   PLT 102*  --    Cardiac EnzymesNo results for input(s): TROPONINI in the last 168 hours. No results for input(s): TROPIPOC in the last 168 hours.  BNPNo results for  input(s): BNP, PROBNP in the last 168 hours.  DDimer No results for input(s): DDIMER in the last 168 hours.  Radiology/Studies:  Dg Chest Portable 1 View  Result Date: 10/01/2019 CLINICAL DATA:  Chest pain EXAM: PORTABLE CHEST 1 VIEW COMPARISON:  01/01/2019 FINDINGS: Normal heart size and mediastinal contours. There is no edema, consolidation, effusion, or pneumothorax. Noted nipple shadows. Surgical clips at the GE junction. Generalized osteopenia IMPRESSION: No evidence of acute disease. Electronically Signed   By: Monte Fantasia M.D.   On: 10/01/2019 04:05    Assessment and Plan:   Nicholas Porter in an 85 year old man with known CAD s/p PCI of RCA, OM and presents with severe SSCP found to have anterolateral STEMI. He is s/p PCI of the of D1 for 99% mid lesion.   #CAD s/p STEMI -- ASA 81 mg daily -- Received Brillinta, but will need to verify cost prior to prescribing.  -- Complete TTE -- Lipids, a1c to risk stratify.  -- Can continue home imdur pending BP  #Metabolic Acidosis -- Follow up repeat CMP -- Check lactate.   #AKI on CKD -- Cr today 2.4 up from 1.9  -- Would continue hydration -- Will send UA -- Used XXcc of contrast during case  #Hypothryoidism -- Check TSH -- Continue home synthroid.   Severity of Illness: The appropriate patient status for this patient is INPATIENT. Inpatient status is judged to be reasonable and necessary in order to provide the required intensity of service to ensure the patient's safety. The patient's presenting symptoms, physical exam findings, and initial radiographic and laboratory data in the context of their chronic comorbidities is felt to place them at high risk for further clinical deterioration. Furthermore, it is not anticipated that the patient will be medically stable for discharge from the hospital within 2 midnights of admission. The following factors support the patient status of inpatient.   " The patient's presenting symptoms  include chest pain. " The worrisome physical exam findings include hypotension.  " The initial radiographic and laboratory data are worrisome because of  " The chronic co-morbidities include carotid disease, HTN, CAD.    * I certify that at the point of admission it is my clinical judgment that the patient will require inpatient hospital care spanning beyond 2 midnights from the  point of admission due to high intensity of service, high risk for further deterioration and high frequency of surveillance required.*    For questions or updates, please contact [place] Please consult [email or web address] for contact info under     Signed, Milus Banister, MD  10/01/2019 4:57 AM   I have personally seen and examined this patient with Dr. Marletta Lor. I agree with the assessment and plan as outlined above. He is being admitted with a lateral STEMI. His chest pain began tonight. He is a DNR but with ongoing chest pain, he wished to proceed with emergent cardiac catheterization. Cardiac catheterization with thrombotic occlusion of the small to moderate caliber Diagonal branch and also severe stenosis mid RCA. The Diagonal branch was felt to be his culprit lesion. PCI of the diagonal branch with balloon angioplasty only. I did not place a stent given the small size of the vessel. Pain improved post balloon angioplasty.  Plan to continue Aggrastat drip for 18 hours. Continue ASA and Brilinta. (Note, will have to be reloaded with Brilinta in the ICU following episode of emesis just prior to leaving the cath lab).  Consider beta blocker if BP tolerates.  Consider statin if no true intolerance (see chart, itching with Lipitor in the past).  Plan staged PCI of the RCA next week if renal function stable.   Lauree Chandler 10/01/2019 5:56 AM

## 2019-10-01 NOTE — ED Notes (Signed)
Contacted pt's daughter, Lynelle Smoke. Advised daughter that mom is in the waiting room on 2H.

## 2019-10-01 NOTE — ED Triage Notes (Signed)
Pt c/o CP that began at 00:00. Had 324 asa, 2 nitro and 4mg  morphine. Intermittent N/V and SOB.

## 2019-10-01 NOTE — ED Notes (Signed)
Wife at bedside.

## 2019-10-01 NOTE — ED Notes (Signed)
[phone] pts wife Theda Belfast husband has a DNR, was given to EMS.  Has a kidney Dr Justin Mend, wife ONLY wants this doctor to be contacted if kidney related

## 2019-10-01 NOTE — Care Management (Signed)
Brilinta benefits check sent and pending.  Zanyah Lentsch RN, BSN, NCM-BC, ACM-RN [phone] 

## 2019-10-01 NOTE — Progress Notes (Signed)
CRITICAL VALUE ALERT  Critical Value:  LA 3.2, Trop 7701  Date & Time Notied:  10/01/19 0810  Provider Notified: Martinique MD  Orders Received/Actions taken: None

## 2019-10-01 NOTE — Progress Notes (Addendum)
Progress Note  Patient Name: Nicholas Porter Date of Encounter: 10/01/2019  Primary Cardiologist: Glenetta Hew, MD    Subjective   Patient states he still has a slight chest pain. Overall feels much better.   Inpatient Medications    Scheduled Meds: . [START ON 10/02/2019] aspirin EC  81 mg Oral Daily  . Chlorhexidine Gluconate Cloth  6 each Topical Daily  . donepezil  10 mg Oral QHS  . isosorbide mononitrate  60 mg Oral Daily  . latanoprost  1 drop Both Eyes QHS  . levothyroxine  25 mcg Oral QAC breakfast  . sodium chloride flush  3 mL Intravenous Q12H  . ticagrelor  90 mg Oral BID   Continuous Infusions: . sodium chloride 10 mL/hr (10/01/19 0412)  . sodium chloride 75 mL/hr at 10/01/19 0623  . sodium chloride    . tirofiban 0.075 mcg/kg/min (10/01/19 [id number])   PRN Meds: sodium chloride, acetaminophen, hydrALAZINE, labetalol, morphine injection, nitroGLYCERIN, ondansetron (ZOFRAN) IV, sodium chloride flush   Vital Signs    Vitals:   10/01/19 0630 10/01/19 0645 10/01/19 0700 10/01/19 0800  BP: (!) 155/81 (!) 177/89 (!) 115/48   Pulse: 76 97  (!) 55  Resp: 19 (!) 30 17 17   Temp:    (!) 97.4 F (36.3 C)  TempSrc:    Oral  SpO2: 94% 96%    Weight:      Height:       No intake or output data in the 24 hours ending 10/01/19 0840 Last 3 Weights 10/01/2019 07/19/2019 03/03/2019  Weight (lbs) 89 lb 92 lb 4 oz 99 lb 3.2 oz  Weight (kg) 40.37 kg 41.844 kg 44.997 kg      Telemetry    NSR - Personally Reviewed  ECG    NSR. ST elevation improved in lateral leads.- Personally Reviewed  Physical Exam   GEN: Elderly WM No acute distress.   Neck: No JVD Cardiac: RRR, gr 3/6 harsh systolic murmur RUSB radiating to apex and to the carotids. no rubs, or gallops.  Respiratory: Clear to auscultation bilaterally. GI: Soft, nontender, non-distended  MS: No edema; No deformity. Right TR band on. Bruising but no hematoma Neuro:  Nonfocal  Psych: Normal affect   Labs     High Sensitivity Troponin:   Recent Labs  Lab 10/01/19 0326 10/01/19 0702  TROPONINIHS 34* 7,701*      Chemistry Recent Labs  Lab 10/01/19 0326 10/01/19 0409 10/01/19 0702  NA 141 141 140  K 4.9 4.7 5.0  CL 113*  --  113*  CO2 14*  --  11*  GLUCOSE 145*  --  188*  BUN 50*  --  46*  CREATININE 2.39*  --  2.35*  CALCIUM 9.7  --  9.1  PROT 6.7  --  6.2*  ALBUMIN 3.8  --  3.4*  AST 17  --  59*  ALT 12  --  17  ALKPHOS 91  --  91  BILITOT 0.7  --  1.1  GFRNONAA 23*  --  24*  GFRAA 27*  --  28*  ANIONGAP 14  --  16*     Hematology Recent Labs  Lab 10/01/19 0319 10/01/19 0409  WBC 6.8  --   RBC 3.50*  --   HGB 12.1* 11.6*  HCT 35.4* 34.0*  MCV 101.1*  --   MCH 34.6*  --   MCHC 34.2  --   RDW 14.3  --   PLT 102*  --  BNPNo results for input(s): BNP, PROBNP in the last 168 hours.   DDimer No results for input(s): DDIMER in the last 168 hours.   Radiology    Dg Chest Portable 1 View  Result Date: 10/01/2019 CLINICAL DATA:  Chest pain EXAM: PORTABLE CHEST 1 VIEW COMPARISON:  01/01/2019 FINDINGS: Normal heart size and mediastinal contours. There is no edema, consolidation, effusion, or pneumothorax. Noted nipple shadows. Surgical clips at the GE junction. Generalized osteopenia IMPRESSION: No evidence of acute disease. Electronically Signed   By: Monte Fantasia M.D.   On: 10/01/2019 04:05    Cardiac Studies   Coronary/Graft Acute MI Revascularization  LEFT HEART CATH AND CORONARY ANGIOGRAPHY  Conclusion    Prox RCA to Mid RCA lesion is 20% stenosed.  Mid RCA lesion is 95% stenosed.  Mid RCA to Dist RCA lesion is 40% stenosed.  Prox Cx lesion is 50% stenosed.  1st Diag lesion is 99% stenosed.  Prox LAD lesion is 50% stenosed.  Post intervention, there is a 20% residual stenosis.  Balloon angioplasty was performed using a BALLOON SAPPHIRE 2.0X12.   1. Acute anterolateral STEMI secondary to thrombotic occlusion of the small to moderate  caliber first Diagonal branch 2. Successful PTCA with balloon angioplasty of the Diagonal branch. No stent was placed given the relatively small size of the vessel. Flow was established down the Diagonal with balloon angioplasty only. 3. Moderate mid LAD stenosis at the takeoff of the Diagonal branch which does not appear to be flow limiting 4. Moderate mid proximal to mid Circumflex stenosis which does not appear to be flow limiting.  5. The RCA is a large dominant vessel with a patent mid stented segment with mild stent restenosis. There is a severe stenosis in the mid RCA just beyond the old stented segment.   Recommendations: Will reload Brilinta 180 mg po x 1 in the ICU as the patient had an episode of emesis prior to leaving the cath lab. Will continue Aggrastat drip for 18 hours given thrombotic burden. Will continue ASA/Brilinta for 12 months. Consider starting a beta blocker later today pending his BP. He has apparently had intolerance to statins in the past (itching noted in record). Will explore this and consider another statin prior to discharge. I would anticipate that we will gently hydrate over the next 24 hours and if renal function remains stable, will plan staged PCI of the RCA next week.       Patient Profile     85 y.o. male with known CAD s/p PCI of RCA, OM and presents with severe SSCP found to have anterolateral STEMI. He is s/p PCI of the of D1 for 99% mid lesion.   Assessment & Plan    1. CAD s/p acute lateral STEMI -- s/p POBA of diagonal branch about 2 hours ago. -- ASA 81 mg daily -- on Brilinta -- check Echo today -- Lipids, a1c to risk stratify. On high dose statin -- BP is elevated. Will resume Imdur. Not a candidate for beta blocker due to bradycardia -- no a candidate for ACEi/ARB due to CKD -- he has residual high grade stenosis in the mid RCA distal to prior stent. Consider staged PCI next week if renal function stable.   2. Metabolic Acidosis --  lactate 3.2  -- suspect this is related to CKD. Bicarb was 15 last January --no evidence of infection and he is hypertensive.  -- will start sodium bicarbonate  3. AKI on CKD stage IV -- Cr  today 2.4 up from 1.9  -- Would continue hydration -- Will send UA -- Used 200 cc of contrast during case -- will need to monitor renal function closely  4. 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   5. Anemia of chronic disease. Stable.   6. Aortic stenosis. Moderate by Echo in January. Will update Echo.  7. HTN  8. S/p left CEA  9. History of dementia.   For questions or updates, please contact [place] Please consult [email or web address] for contact info under        Signed,  [place], MD  10/01/2019, 8:40 AM

## 2019-10-01 NOTE — Progress Notes (Signed)
Initial Nutrition Assessment  RD working remotely.  DOCUMENTATION CODES:   Underweight, suspect severe malnutrition but unable to confirm at this time  INTERVENTION:   - Ensure Enlive po TID, each supplement provides 350 kcal and 20 grams of protein  NUTRITION DIAGNOSIS:   Increased nutrient needs related to chronic illness (COPD) as evidenced by estimated needs.  GOAL:   Patient will meet greater than or equal to 90% of their needs  MONITOR:   PO intake, Supplement acceptance, Labs, Weight trends  REASON FOR ASSESSMENT:   Other (underweight BMI)    ASSESSMENT:   85 year old male who presented to the ED on 10/9 with chest pain. Code STEMI. PMH of CKD stage IV, anemia, CAD, COPD, dementia, HTN, STEMI in 2014. Pt taken to the cath lab and is now s/p PCI of D1.   Reviewed RD notes from previous admissions. Pt with a history of severe malnutrition.  Spoke with RN via phone call to room. Asked to speak with pt but RN reports pt does not want to talk to anyone and does not have family at bedside. RD will attempt to obtain diet and weight history at follow-up.  Noted pt has accepted Ensure Enlive supplements during previous admissions. Will order these.  Reviewed weight history in chart. Noted pt with a 4.1 kg weight loss over the last 7 months. This is a 9.2% weight loss which is not quite significant for timeframe.  Strongly suspect severe malnutrition persists but unable to confirm without NFPE or diet history.  No meal completions recorded at this time.  Medications reviewed and include: sodium bicarb 650 mg QID, IV Aggrastat IVF: NS @ 75 ml/hr  Labs reviewed.  NUTRITION - FOCUSED PHYSICAL EXAM:  Unable to complete at this time. RD working remotely.  Diet Order:   Diet Order            Diet Heart Room service appropriate? Yes; Fluid consistency: Thin  Diet effective now              EDUCATION NEEDS:   Not appropriate for education at this time  Skin:   Skin Assessment: Reviewed RN Assessment  Last BM:  no documented BM  Height:   Ht Readings from Last 1 Encounters:  10/01/19 5\' 7"  (1.702 m)    Weight:   Wt Readings from Last 1 Encounters:  10/01/19 40.4 kg    Ideal Body Weight:  67.3 kg  BMI:  Body mass index is 13.94 kg/m.  Estimated Nutritional Needs:   Kcal:  1550-1750  Protein:  65-80 grams  Fluid:  >/= 1.5 L    Gaynell Face, MS, RD, LDN Inpatient Clinical Dietitian Pager: [phone] Weekend/After Hours: [phone]

## 2019-10-01 NOTE — Progress Notes (Signed)
  Echocardiogram 2D Echocardiogram has been performed.  Nicholas Porter 10/01/2019, 11:58 AM

## 2019-10-01 NOTE — ED Provider Notes (Signed)
[facility] EMERGENCY DEPARTMENT Provider Note  CSN: [id number] Arrival date & time: 10/01/19 [id number]  Chief Complaint(s) Chest Pain  HPI Nicholas Porter is a 85 y.o. male with extensive past medical history listed below including STEMI in 2014 status post stenting who presents to the emergency department with sudden onset substernal chest pain described as a pounding sensation associated with nausea and nonbloody nonbilious emesis and shortness of breath.  Patient is in significant pain, making history obtaining difficult.  He denies any recent fevers or infections.  No coughing or congestion.  No abdominal pain.  He was brought in by EMS who gave the patient aspirin, nitroglycerin and morphine which did not improve the pain.  HPI  Past Medical History Past Medical History:  Diagnosis Date  . Acute metabolic encephalopathy [id number]  . Acute renal failure superimposed on stage 4 chronic kidney disease ([place]) 11/27/2015  . Anemia of chronic renal failure, stage 4 (severe) ([place]) 11/27/2015  . Anxiety   . Arthritis    back  . 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 CAD S/P percutaneous coronary angioplasty - DES in RCA with PTCA for ISR; Moderate mLAD, 80% ostial OM2 stable 05/22/2008   Qualifier: Diagnosis of  By: Linda Hedges MD, Heinz Knuckles   . CKD (chronic kidney disease) stage 3, GFR 30-59 ml/min ([place])   . COPD (chronic obstructive pulmonary disease) ([place])   . Dementia ([place]) 05/13/2019  . Depression   . Dyspnea    with activity   . Essential hypertension 06/30/2007   Qualifier: Diagnosis of  By: Cori Razor RN, Mikal Plane Eye abnormalities    injections in eyes 12/2017  . H/O Inferior STEMI (09/2013) emergent PCI with Promus DES to RCA (3.0 mm  x 28 mm - 3.2 mm) 10/05/2013  . Hypothyroidism   . Klebsiella sepsis ([place]) 04/02/2017  . Left-sided carotid artery disease -- s/p CEA 04/06/2006   S/p  Left CEA   . Mitral regurgitation - onexam 09/03/2016   Patient Active Problem List   Diagnosis Date Noted  . Failure to thrive in adult 07/20/2019  . Dementia ([place]) 05/13/2019  . Moderate aortic stenosis by prior echocardiogram 01/28/2019  . Epistaxis, recurrent 01/28/2019  . Delayed cystic duct stump bile leak s/p ERCP/stent 01/06/2019 01/07/2019  . Protein-calorie malnutrition, severe 01/04/2019  . CKD (chronic kidney disease), stage III 01/03/2019  . Pill dysphagia 01/02/2019  . Acute gangrenous cholecystitis s/p lap cholecystectomy 01/02/2019 01/01/2019  . CKD (chronic kidney disease) stage 4, GFR 15-29 ml/min (HCC) 01/01/2019  . Unintentional weight loss 01/01/2019  . History of medication noncompliance 01/01/2019  . History of partial gastrectomy 01/01/2019  . Hyperlipidemia with target LDL less than 70 10/23/2018  . Psychiatric disturbance 04/15/2017  . Orthostatic hypotension 04/08/2017  . Benign prostatic hyperplasia 03/11/2017  . Weakness   . Mitral regurgitation - on exam 09/03/2016  . Anemia of chronic renal failure, stage 4 (severe) ([place]) 11/27/2015  . GERD (gastroesophageal reflux disease) 01/24/2015  . Thrombocytopenia ([place]) 08/09/2014  . H/O Inferior STEMI (09/2013) emergent PCI with Promus DES to RCA (3.0 mm  x 28 mm - 3.2 mm) 10/05/2013  . 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 CAD S/P percutaneous coronary angioplasty - DES in RCA with PTCA for ISR; Moderate mLAD, 80% ostial OM2 stable 05/22/2008  . Essential hypertension 06/30/2007  . Left-sided carotid artery disease -- s/p CEA 04/06/2006  Class: History of   Home Medication(s) Prior to Admission medications   Medication Sig Start Date End Date Taking? Authorizing Provider  acetaminophen (TYLENOL) 650 MG CR tablet Take 1,300 mg by mouth every 8 (eight) hours as needed for pain.    [provider]  brinzolamide (AZOPT) 1 % ophthalmic suspension Place 1 drop into the left eye every 12 (twelve) hours.      [provider]  donepezil (ARICEPT) 10 MG tablet Take 1 tablet (10 mg total) by mouth at bedtime. 05/13/19   Kathrynn Ducking, MD  hydrALAZINE (APRESOLINE) 50 MG tablet CHECK BLOOD PRESSURE 2  TO 3 TIMES A DAY ,  IF  BLOOD PRESSURE IS GREATER THAN 150 mmHg TAKE A TABLET AS NEEDED.. 07/19/19   Leonie Man, MD  isosorbide mononitrate (IMDUR) 60 MG 24 hr tablet TAKE 1 TABLET(60 MG) BY MOUTH DAILY 09/14/19   Nafziger, [name], NP  levothyroxine (SYNTHROID) 25 MCG tablet Take 1 tablet (25 mcg total) by mouth daily before breakfast. 04/27/19   Nafziger, [name], NP  Multiple Vitamin (MULTIVITAMIN WITH MINERALS) TABS tablet Take 1 tablet by mouth daily. 01/12/19   Georgette Shell, MD  nitroGLYCERIN (NITROSTAT) 0.4 MG SL tablet DISSOLVE 1 TABLET UNDER THE TONGUE EVERY 5 MINUTES AS NEEDED FOR CHEST PAIN 08/21/17   Leonie Man, MD  TRAVATAN Z 0.004 % SOLN ophthalmic solution Place 1 drop into both eyes at bedtime.  04/20/13   [provider]                                                                                                                                    Past Surgical History Past Surgical History:  Procedure Laterality Date  . AMPUTATION     left index finger -tramatic  . BILIARY STENT PLACEMENT N/A 01/06/2019   Procedure: BILIARY STENT PLACEMENT;  Surgeon: Clarene Essex, MD;  Location: [facility];  Service: Endoscopy;  Laterality: N/A;  . CARDIAC CATHETERIZATION  November 2011   40% mid LAD, 50% proximal RCA, 30-40% distal RCA (DOMINANT RCA with wraparound PDA & 2 large PLBs), 60-70% ostial OM1. No change from 2001. Medical therapy  . CAROTID ENDARTERECTOMY Left 2007   Dr.Hayes: 08/2016 Dopplers: Stable less than 40% right internal carotid stenosis and stable moderate 40-60% left internal carotid stenosis. Patent vertebral and subclavian arteries.  . CARPAL TUNNEL RELEASE    . CHOLECYSTECTOMY N/A 01/02/2019   Procedure: LAPAROSCOPIC CHOLECYSTECTOMY;  Surgeon: Michael Boston, MD;  Location: [facility]S;  Service: General;  Laterality: N/A;  . CORNEAL TRANSPLANT    . CORONARY ANGIOPLASTY WITH STENT PLACEMENT  09/2013; 05/2014    d) Promus DES 3.0 mm x 28 mm (3.2 mm)  to RCA with STEMI; residual 70% mid-distal LAD, OM 270-80%. Medical management; b) PTCA of 75 % ISR , LAD lesion noted as ~40%  . ERCP N/A 01/06/2019   Procedure: ENDOSCOPIC RETROGRADE CHOLANGIOPANCREATOGRAPHY (ERCP);  Surgeon: Clarene Essex, MD;  Location: [facility];  Service: Endoscopy;  Laterality: N/A;  . ERCP N/A 05/28/2019   Procedure: ENDOSCOPIC RETROGRADE CHOLANGIOPANCREATOGRAPHY (ERCP);  Surgeon: Clarene Essex, MD;  Location: [place];  Service: Endoscopy;  Laterality: N/A;  . FOOT SURGERY     left  . HERNIA REPAIR    . LEFT HEART CATHETERIZATION WITH CORONARY ANGIOGRAM N/A 10/05/2013   Procedure: LEFT HEART CATHETERIZATION WITH CORONARY ANGIOGRAM;  Surgeon: Peter M Martinique, MD;  Location: [facility] CATH LAB;  Service: Cardiovascular;  RCA 100% - PCI,CxOM stable, LAD ~70% mid;  . LEFT HEART CATHETERIZATION WITH CORONARY ANGIOGRAM N/A 06/08/2014   Procedure: LEFT HEART CATHETERIZATION WITH CORONARY ANGIOGRAM;  Surgeon: Troy Sine, MD;  Location: [facility] CATH LAB;  Service: Cardiovascular;  UA 6/'15: 75% ISR in RCA - PTCA, LAD lesion ~40%, CxOM stable.    Marland Kitchen NM MYOVIEW LTD  03/2017    - Elevatred Troponin in setting of Sepsis -- DEMAND ISCHEMIA.  Normal perfusion. LVEF 51% with normal wall motion. This is a low risk study.  Marland Kitchen PARTIAL GASTRECTOMY    . PERCUTANEOUS CORONARY INTERVENTION-BALLOON ONLY  06/08/2014   Procedure: PERCUTANEOUS CORONARY INTERVENTION-BALLOON ONLY;  Surgeon: Troy Sine, MD;  Location: [facility] CATH LAB;  Service: Cardiovascular;;  . REMOVAL OF STONES  05/28/2019   Procedure: REMOVAL OF STONES;  Surgeon: Clarene Essex, MD;  Location: [place];  Service: Endoscopy;;  . ROTATOR CUFF REPAIR     left  . SHOULDER ARTHROSCOPY WITH ROTATOR CUFF REPAIR AND SUBACROMIAL DECOMPRESSION Right  07/01/2013   Procedure: RIGHT SHOULDER ARTHROSCOPY WITH  SUBACROMIAL DECOMPRESSION/DISTAL CLAVICLE RESECTION/ROTATOR CUFF REPAIR ;  Surgeon: Marin Shutter, MD;  Location: [place];  Service: Orthopedics;  Laterality: Right;  . SPHINCTEROTOMY  01/06/2019   Procedure: SPHINCTEROTOMY;  Surgeon: Clarene Essex, MD;  Location: [facility];  Service: Endoscopy;;  . SPINE SURGERY  1985   cervical laminectomy  . STENT REMOVAL  05/28/2019   Procedure: STENT REMOVAL;  Surgeon: Clarene Essex, MD;  Location: [place];  Service: Endoscopy;;  . TOTAL KNEE ARTHROPLASTY    . TRANSTHORACIC ECHOCARDIOGRAM  09/'17; 4/'18; 1/'20   a) Normal EF 55-60%. Mild to moderate AS with mod AI. Mild MR.;; b) Mild global reduction in LV systolic function: [id number]; grade 1 DD - high LVEDP. Mild AS/AI/TR. Mild-Mod MR; moderately elevated PAP.;; c) TTE 12/2018: EF 55-60%. No RWMA. Gr1 DD. Mod AS with mild AI. Severe LA dilation.  Marland Kitchen VAGOTOMY     partial gastrectomy   Family History Family History  Problem Relation Age of Onset  . Heart disease Sister        Heart Transplant   . Diabetes Sister     Social History Social History   Tobacco Use  . Smoking status: Former Smoker    Years: 15.00    Types: Cigarettes    Quit date: 12/23/1964    Years since quitting: 54.8  . Smokeless tobacco: Never Used  Substance Use Topics  . Alcohol use: No  . Drug use: No   Allergies Amlodipine besylate, Lipitor [atorvastatin], Naproxen, Oxycodone-acetaminophen, and Isosorbide  Review of Systems Review of Systems All other systems are reviewed and are negative for acute change except as noted in the HPI  Physical Exam Vital Signs  I have reviewed the triage vital signs BP (!) 195/61 Comment: States takes medicine for it "off and on"  Pulse 75   Temp 97.8 F (36.6 C) (Oral)   Resp (!) 26   Ht  5\' 7"  (1.702 m)   Wt 40.4 kg   SpO2 100%   BMI 13.94 kg/m   Physical Exam Vitals signs reviewed.  Constitutional:      General: He is  in acute distress.     Appearance: He is well-developed. He is cachectic. He is not diaphoretic.     Comments: Writhing in bed  HENT:     Head: Normocephalic and atraumatic.     Nose: Nose normal.  Eyes:     General: No scleral icterus.       Right eye: No discharge.        Left eye: No discharge.     Conjunctiva/sclera: Conjunctivae normal.     Pupils: Pupils are equal, round, and reactive to light.  Neck:     Musculoskeletal: Normal range of motion and neck supple.  Cardiovascular:     Rate and Rhythm: Normal rate and regular rhythm.     Heart sounds: No murmur. No friction rub. No gallop.   Pulmonary:     Effort: Pulmonary effort is normal. No respiratory distress.     Breath sounds: Normal breath sounds. No stridor. No rales.  Abdominal:     General: There is no distension.     Palpations: Abdomen is soft.     Tenderness: There is no abdominal tenderness.  Musculoskeletal:        General: No tenderness.  Skin:    General: Skin is warm and dry.     Findings: No erythema or rash.  Neurological:     Mental Status: He is alert and oriented to person, place, and time.     ED Results and Treatments Labs (all labs ordered are listed, but only abnormal results are displayed) Labs Reviewed  CBG MONITORING, ED - Abnormal; Notable for the following components:      Result Value   Glucose-Capillary 128 (*)    All other components within normal limits  POCT I-STAT EG7 - Abnormal; Notable for the following components:   pCO2, Ven 30.5 (*)    pO2, Ven 135.0 (*)    Bicarbonate 14.9 (*)    TCO2 16 (*)    Acid-base deficit 10.0 (*)    HCT 34.0 (*)    Hemoglobin 11.6 (*)    All other components within normal limits  SARS CORONAVIRUS 2 BY RT PCR (HOSPITAL ORDER, [place] [facility])  CBC  COMPREHENSIVE METABOLIC PANEL  APTT  LIPID PANEL  TROPONIN I (HIGH SENSITIVITY)                                                                                                                          EKG  EKG Interpretation  Date/Time:  Friday October 01 2019 03:11:00 EDT Ventricular Rate:  73 PR Interval:    QRS Duration: 93 QT Interval:  432 QTC Calculation: 477 R Axis:   49 Text Interpretation:  Sinus rhythm Atrial premature complex Anterior infarct, old Borderline ST elevation, lateral  leads ** ** ACUTE MI / STEMI ** ** Confirmed by Addison Lank [phone]) on 10/01/2019 4:18:14 AM      Radiology Dg Chest Portable 1 View  Result Date: 10/01/2019 CLINICAL DATA:  Chest pain EXAM: PORTABLE CHEST 1 VIEW COMPARISON:  01/01/2019 FINDINGS: Normal heart size and mediastinal contours. There is no edema, consolidation, effusion, or pneumothorax. Noted nipple shadows. Surgical clips at the GE junction. Generalized osteopenia IMPRESSION: No evidence of acute disease. Electronically Signed   By: Monte Fantasia M.D.   On: 10/01/2019 04:05    Pertinent labs & imaging results that were available during my care of the patient were reviewed by me and considered in my medical decision making (see chart for details).  Medications Ordered in ED Medications  nitroGLYCERIN (NITROSTAT) SL tablet 0.4 mg (0.4 mg Sublingual Given 10/01/19 0350)  0.9 %  sodium chloride infusion (10 mL/hr Intravenous New Bag/Given 10/01/19 0412)  fentaNYL (SUBLIMAZE) injection 50 mcg (50 mcg Intravenous Given 10/01/19 0341)  ondansetron (ZOFRAN) injection 4 mg (4 mg Intravenous Given 10/01/19 0341)  heparin injection 2,400 Units (2,400 Units Intravenous Given 10/01/19 0410)                                                                                                                                    Procedures .Critical Care Performed by: Fatima Blank, MD Authorized by: Fatima Blank, MD    CRITICAL CARE Performed by: Grayce Sessions Card[place] Total critical care time: 30 minutes Critical care time was exclusive of separately billable procedures and treating other patients.  Critical care was necessary to treat or prevent imminent or life-threatening deterioration. Critical care was time spent personally by me on the following activities: development of treatment plan with patient and/or surrogate as well as nursing, discussions with consultants, evaluation of patient's response to treatment, examination of patient, obtaining history from patient or surrogate, ordering and performing treatments and interventions, ordering and review of laboratory studies, ordering and review of radiographic studies, pulse oximetry and re-evaluation of patient's condition.    (including critical care time)  Medical Decision Making / ED Course I have reviewed the nursing notes for this encounter and the patient's prior records (if available in EHR or on provided paperwork).   Anar Escobedo was evaluated in Emergency Department on 10/01/2019 for the symptoms described in the history of present illness. He was evaluated in the context of the global COVID-19 pandemic, which necessitated consideration that the patient might be at risk for infection with the SARS-CoV-2 virus that causes COVID-19. Institutional protocols and algorithms that pertain to the evaluation of patients at risk for COVID-19 are in a state of rapid change based on information released by regulatory bodies including the CDC and federal and state organizations. These policies and algorithms were followed during the patient's care in the ED.  EKG notable for ST segment elevation and hyperacute T waves in septal and lateral leads  with reciprocal changes.  Code STEMI initiated.  Discussed case with Dr. Angelena Form who agreed.   Provided with fentanyl and nitroglycerin which did improve patient's pain.  Chest x-ray with similar mediastinal contour -not suggestive of dissection.  Continued with STEMI route.  Heparin bolus initiated.  Taken to [employer].        Final Clinical Impression(s) / ED Diagnoses Final diagnoses:   Acute ST elevation myocardial infarction (STEMI) of lateral wall ([place])      This chart was dictated using voice recognition software.  Despite best efforts to proofread,  errors can occur which can change the documentation meaning.   Fatima Blank, MD 10/01/19 [phone]

## 2019-10-01 NOTE — TOC Benefit Eligibility Note (Signed)
Transition of Care [facility]) Benefit Eligibility Note    Patient Details  Name: Nicholas Porter MRN: [id number] Date of Birth: 1930-10-09   Medication/Dose: Kary Kos 90mg  twice daily  Covered?: Yes  Tier: 3 Drug  Prescription Coverage Preferred Pharmacy: Any retail Pharmacy  Spoke with Person/Company/Phone Number:: [employer]  Co-Pay: 30 day supply 47.00 / 90 day supply Mail order 120.00  Prior Approval: No  Deductible: Unmet(424.20 lett)  Additional Notes: Ticagrelor is [place] Phone Number: 10/01/2019, 10:56 AM

## 2019-10-02 DIAGNOSIS — I10 Essential (primary) hypertension: Secondary | ICD-10-CM

## 2019-10-02 DIAGNOSIS — I255 Ischemic cardiomyopathy: Secondary | ICD-10-CM

## 2019-10-02 DIAGNOSIS — N171 Acute kidney failure with acute cortical necrosis: Secondary | ICD-10-CM

## 2019-10-02 DIAGNOSIS — N1832 Chronic kidney disease, stage 3b: Secondary | ICD-10-CM

## 2019-10-02 DIAGNOSIS — E782 Mixed hyperlipidemia: Secondary | ICD-10-CM

## 2019-10-02 LAB — BASIC METABOLIC PANEL
Anion gap: 11 (ref 5–15)
BUN: 58 mg/dL — ABNORMAL HIGH (ref 8–23)
CO2: 18 mmol/L — ABNORMAL LOW (ref 22–32)
Calcium: 9.2 mg/dL (ref 8.9–10.3)
Chloride: 113 mmol/L — ABNORMAL HIGH (ref 98–111)
Creatinine, Ser: 2.5 mg/dL — ABNORMAL HIGH (ref 0.61–1.24)
GFR calc Af Amer: 26 mL/min — ABNORMAL LOW (ref >60)
GFR calc non Af Amer: 22 mL/min — ABNORMAL LOW (ref >60)
Glucose, Bld: 125 mg/dL — ABNORMAL HIGH (ref 70–99)
Potassium: 5 mmol/L (ref 3.5–5.1)
Sodium: 142 mmol/L (ref 135–145)

## 2019-10-02 LAB — CBC
HCT: 28 % — ABNORMAL LOW (ref 39.0–52.0)
Hemoglobin: 10 g/dL — ABNORMAL LOW (ref 13.0–17.0)
MCH: 35.2 pg — ABNORMAL HIGH (ref 26.0–34.0)
MCHC: 35.7 g/dL (ref 30.0–36.0)
MCV: 98.6 fL (ref 80.0–100.0)
Platelets: 97 10*3/uL — ABNORMAL LOW (ref 150–400)
RBC: 2.84 MIL/uL — ABNORMAL LOW (ref 4.22–5.81)
RDW: 14.4 % (ref 11.5–15.5)
WBC: 9.3 10*3/uL (ref 4.0–10.5)
nRBC: 0 % (ref 0.0–0.2)

## 2019-10-02 LAB — LACTIC ACID, PLASMA
Lactic Acid, Venous: 1 mmol/L (ref 0.5–1.9)
Lactic Acid, Venous: 1.2 mmol/L (ref 0.5–1.9)

## 2019-10-02 MED ORDER — SODIUM CHLORIDE 0.9 % IV SOLN
INTRAVENOUS | Status: DC
  Administered 2019-10-02 – 2019-10-05 (×5): via INTRAVENOUS

## 2019-10-02 MED ORDER — CARVEDILOL 3.125 MG PO TABS
3.1250 mg | ORAL_TABLET | Freq: Two times a day (BID) | ORAL | Status: DC
  Administered 2019-10-02 – 2019-10-03 (×3): 3.125 mg via ORAL
  Filled 2019-10-02 (×3): qty 1

## 2019-10-02 NOTE — Progress Notes (Signed)
EKG CRITICAL VALUE     12 lead EKG performed.  Critical value noted.  Lavella Lemons, RN notified.   Genia Plants, CCT 10/02/2019 11:06 AM

## 2019-10-02 NOTE — Progress Notes (Signed)
PA aware of occasional increase of systolic b/p to [id number] - [id number].  See NO

## 2019-10-02 NOTE — Progress Notes (Signed)
Progress Note  Patient Name: Nicholas Porter Date of Encounter: 10/02/2019  Primary Cardiologist: Glenetta Hew, MD   Subjective   He feels much better, feels lonely and wishes to go home.  Inpatient Medications    Scheduled Meds: . aspirin EC  81 mg Oral Daily  . Chlorhexidine Gluconate Cloth  6 each Topical Daily  . donepezil  10 mg Oral QHS  . feeding supplement (ENSURE ENLIVE)  237 mL Oral TID BM  . isosorbide mononitrate  60 mg Oral Daily  . latanoprost  1 drop Both Eyes QHS  . levothyroxine  25 mcg Oral QAC breakfast  . mouth rinse  15 mL Mouth Rinse BID  . sodium bicarbonate  650 mg Oral QID  . sodium chloride flush  3 mL Intravenous Q12H  . ticagrelor  90 mg Oral BID   Continuous Infusions: . sodium chloride 10 mL/hr at 10/02/19 0700  . sodium chloride     PRN Meds: sodium chloride, acetaminophen, influenza vaccine adjuvanted, morphine injection, nitroGLYCERIN, ondansetron (ZOFRAN) IV, sodium chloride flush   Vital Signs    Vitals:   10/02/19 0700 10/02/19 0753 10/02/19 0800 10/02/19 0900  BP: (!) 172/68  (!) 176/77 (!) 133/56  Pulse: 76  86 82  Resp: (!) 25  (!) 22 (!) 23  Temp:  98 F (36.7 C)    TempSrc:  Oral    SpO2: 99%  99% 98%  Weight:      Height:        Intake/Output Summary (Last 24 hours) at 10/02/2019 0952 Last data filed at 10/02/2019 0900 Gross per 24 hour  Intake 1023.36 ml  Output 775 ml  Net 248.36 ml   Last 3 Weights 10/02/2019 10/01/2019 07/19/2019  Weight (lbs) 94 lb 5.7 oz 89 lb 92 lb 4 oz  Weight (kg) 42.8 kg 40.37 kg 41.844 kg      Telemetry    NSR - Personally Reviewed  ECG    NSR. ST elevation improved in lateral leads.- Personally Reviewed  Physical Exam   GEN: Elderly WM No acute distress.   Neck: No JVD Cardiac: RRR, gr 3/6 harsh systolic murmur RUSB radiating to apex and to the carotids. no rubs, or gallops.  Respiratory: Clear to auscultation bilaterally. GI: Soft, nontender, non-distended  MS: No  edema; No deformity. Right TR band on. Bruising but no hematoma Neuro:  Nonfocal  Psych: Normal affect   Labs    High Sensitivity Troponin:   Recent Labs  Lab 10/01/19 0326 10/01/19 0702 10/01/19 1259 10/01/19 1601  TROPONINIHS 34* 7,701* >27,000* >27,000*      Chemistry Recent Labs  Lab 10/01/19 0326 10/01/19 0409 10/01/19 0702 10/02/19 0348  NA 141 141 140 142  K 4.9 4.7 5.0 5.0  CL 113*  --  113* 113*  CO2 14*  --  11* 18*  GLUCOSE 145*  --  188* 125*  BUN 50*  --  46* 58*  CREATININE 2.39*  --  2.35* 2.50*  CALCIUM 9.7  --  9.1 9.2  PROT 6.7  --  6.2*  --   ALBUMIN 3.8  --  3.4*  --   AST 17  --  59*  --   ALT 12  --  17  --   ALKPHOS 91  --  91  --   BILITOT 0.7  --  1.1  --   GFRNONAA 23*  --  24* 22*  GFRAA 27*  --  28* 26*  ANIONGAP 14  --  16* 11     Hematology Recent Labs  Lab 10/01/19 0319 10/01/19 0409 10/02/19 0348  WBC 6.8  --  9.3  RBC 3.50*  --  2.84*  HGB 12.1* 11.6* 10.0*  HCT 35.4* 34.0* 28.0*  MCV 101.1*  --  98.6  MCH 34.6*  --  35.2*  MCHC 34.2  --  35.7  RDW 14.3  --  14.4  PLT 102*  --  97*    BNPNo results for input(s): BNP, PROBNP in the last 168 hours.   DDimer No results for input(s): DDIMER in the last 168 hours.   Radiology    Dg Chest Portable 1 View  Result Date: 10/01/2019 CLINICAL DATA:  Chest pain EXAM: PORTABLE CHEST 1 VIEW COMPARISON:  01/01/2019 FINDINGS: Normal heart size and mediastinal contours. There is no edema, consolidation, effusion, or pneumothorax. Noted nipple shadows. Surgical clips at the GE junction. Generalized osteopenia IMPRESSION: No evidence of acute disease. Electronically Signed   By: Monte Fantasia M.D.   On: 10/01/2019 04:05    Cardiac Studies   Coronary/Graft Acute MI Revascularization  LEFT HEART CATH AND CORONARY ANGIOGRAPHY  Conclusion    Prox RCA to Mid RCA lesion is 20% stenosed.  Mid RCA lesion is 95% stenosed.  Mid RCA to Dist RCA lesion is 40% stenosed.  Prox Cx  lesion is 50% stenosed.  1st Diag lesion is 99% stenosed.  Prox LAD lesion is 50% stenosed.  Post intervention, there is a 20% residual stenosis.  Balloon angioplasty was performed using a BALLOON SAPPHIRE 2.0X12.   1. Acute anterolateral STEMI secondary to thrombotic occlusion of the small to moderate caliber first Diagonal branch 2. Successful PTCA with balloon angioplasty of the Diagonal branch. No stent was placed given the relatively small size of the vessel. Flow was established down the Diagonal with balloon angioplasty only. 3. Moderate mid LAD stenosis at the takeoff of the Diagonal branch which does not appear to be flow limiting 4. Moderate mid proximal to mid Circumflex stenosis which does not appear to be flow limiting.  5. The RCA is a large dominant vessel with a patent mid stented segment with mild stent restenosis. There is a severe stenosis in the mid RCA just beyond the old stented segment.   Recommendations: Will reload Brilinta 180 mg po x 1 in the ICU as the patient had an episode of emesis prior to leaving the cath lab. Will continue Aggrastat drip for 18 hours given thrombotic burden. Will continue ASA/Brilinta for 12 months. Consider starting a beta blocker later today pending his BP. He has apparently had intolerance to statins in the past (itching noted in record). Will explore this and consider another statin prior to discharge. I would anticipate that we will gently hydrate over the next 24 hours and if renal function remains stable, will plan staged PCI of the RCA next week.      TTE: 10/01/2019  1. Left ventricular ejection fraction, by visual estimation, is 45 to 50%. The left ventricle has mildly decreased function. Normal left ventricular size. There is no left ventricular hypertrophy.  2. Apical septal segment and apex are abnormal.  3. Left ventricular diastolic Doppler parameters are consistent with impaired relaxation pattern of LV diastolic filling.  4.  Global right ventricle has normal systolic function.The right ventricular size is normal. No increase in right ventricular wall thickness.  5. Left atrial size was normal.  6. Right atrial size was normal.  7. Moderate mitral annular calcification.  8. The mitral valve is abnormal. Trace mitral valve regurgitation. Mild mitral stenosis.  9. The tricuspid valve is normal in structure. Tricuspid valve regurgitation was not visualized by color flow Doppler. 10. Aortic valve mean gradient measures 19.3 mmHg. 11. Aortic valve peak gradient measures 33.8 mmHg. 12. The aortic valve is bicuspid Aortic valve regurgitation is mild by color flow Doppler. Moderate aortic valve stenosis. 13. There is Moderate thickening of the aortic valve. 14. There is Severe calcifcation of the aortic valve. 15. The pulmonic valve was normal in structure. Pulmonic valve regurgitation is not visualized by color flow Doppler. 16. Normal pulmonary artery systolic pressure. 17. The inferior vena cava is normal in size with greater than 50% respiratory variability, suggesting right atrial pressure of 3 mmHg.  Patient Profile     85 y.o. male with known CAD s/p PCI of RCA, OM and presents with severe SSCP found to have anterolateral STEMI. He is s/p PCI of the of D1 for 99% mid lesion.   Assessment & Plan    1. CAD s/p acute lateral STEMI -- s/p POBA of diagonal branch about 2 hours ago. -- ASA 81 mg daily -- on Brilinta -- check Echo today -- Lipids, a1c to risk stratify. On high dose statin -- BP is elevated. Will start carvedilol 3.125 mg po BID -- no a candidate for ACEi/ARB due to CKD -- he has residual high grade stenosis in the mid RCA distal to prior stent. Consider staged PCI next week if renal function stable.  - echo shows LVEF 45-50%, previously 55-60% in 12/2018  2. Metabolic Acidosis -- lactate 3.2  -- suspect this is related to CKD. Bicarb was 15 last January --no evidence of infection and he is  hypertensive.  -- will start sodium bicarbonate  3. AKI on CKD stage IV -- Cr today 2.5 up from 1.9 -- Would continue hydration -- Will send UA -- Used 200 cc of contrast during case -- will need to monitor renal function closely  4. 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   5. Anemia of chronic disease. Stable.   6. Aortic stenosis. Moderate by Echo in January and yesterday.  7. HTN  8. S/p left CEA  9. History of dementia.  For questions or updates, please contact [place] Please consult [email or web address] for contact info under     Signed, Ena Dawley, MD  10/02/2019, 9:52 AM

## 2019-10-02 NOTE — Progress Notes (Signed)
Large area of sq bleeding noted L lateral a/c.  Area marked.  Will continue to monitor and apply ice as needed to area.

## 2019-10-02 NOTE — Plan of Care (Signed)
  Problem: Cardiovascular: Goal: Vascular access site(s) Level 0-1 will be maintained Outcome: Progressing   Problem: Health Behavior/Discharge Planning: Goal: Ability to safely manage health-related needs after discharge will improve Outcome: Progressing   Discussed discharge needs with patient, who states he is "ready to get out and get moving".  States he is "not the type to sit around and do nothing".   Emphasis placed on gradual return to previous activity.  Importance of maintaining Health-care regimen (including Brilinta, follow-up appointments, and dietary needs) also discussed.  Patient states he had a previous partial gastrectomy, which has greatly affected his appetite.  He states he enjoys Ensure supplements, and has difficulty eating some foods due to his edentulous state and lack of properly fitting dentures.  Right wrist vascular access site level 0 with moderate ecchymosis present.

## 2019-10-02 NOTE — Progress Notes (Signed)
Went in to do bedside report w/Matao RN... pt became agitated/confused.... He was upset/mad that the overhead light was left on.... Initially, was not letting me do a full assessment and balled up his fist stating that he was in the [employer] ... states we do not know what we are doing and he will report [place] b/c the light was left on and this costs the hospital money....   Was finally able to do an assessment.... Pt A&O x4.... was able to answer my questions.... Light has been turned off and pt resting comfortably  According to [facility], she went in to get his temp.... Sts pt was still upset and venting to her...  pt described [facility] and myself as "colored"  Will continue to monitor pt closely.

## 2019-10-02 NOTE — Progress Notes (Signed)
CARDIAC REHAB PHASE I   Went to see pt, pt sleeping soundly in chair. Left MI book, and heart healthy diet at bedside. Will continue to follow.  [id number] Rufina Falco, RN BSN 10/02/2019 1:48 PM

## 2019-10-03 DIAGNOSIS — N179 Acute kidney failure, unspecified: Secondary | ICD-10-CM

## 2019-10-03 DIAGNOSIS — N189 Chronic kidney disease, unspecified: Secondary | ICD-10-CM

## 2019-10-03 DIAGNOSIS — I2129 ST elevation (STEMI) myocardial infarction involving other sites: Secondary | ICD-10-CM

## 2019-10-03 LAB — BASIC METABOLIC PANEL
Anion gap: 7 (ref 5–15)
BUN: 66 mg/dL — ABNORMAL HIGH (ref 8–23)
CO2: 19 mmol/L — ABNORMAL LOW (ref 22–32)
Calcium: 8.4 mg/dL — ABNORMAL LOW (ref 8.9–10.3)
Chloride: 113 mmol/L — ABNORMAL HIGH (ref 98–111)
Creatinine, Ser: 2.45 mg/dL — ABNORMAL HIGH (ref 0.61–1.24)
GFR calc Af Amer: 26 mL/min — ABNORMAL LOW (ref >60)
GFR calc non Af Amer: 23 mL/min — ABNORMAL LOW (ref >60)
Glucose, Bld: 100 mg/dL — ABNORMAL HIGH (ref 70–99)
Potassium: 5.3 mmol/L — ABNORMAL HIGH (ref 3.5–5.1)
Sodium: 139 mmol/L (ref 135–145)

## 2019-10-03 MED ORDER — HYDRALAZINE HCL 20 MG/ML IJ SOLN
10.0000 mg | Freq: Four times a day (QID) | INTRAMUSCULAR | Status: DC | PRN
  Administered 2019-10-03 – 2019-10-05 (×7): 10 mg via INTRAVENOUS
  Filled 2019-10-03 (×7): qty 1

## 2019-10-03 MED ORDER — CARVEDILOL 6.25 MG PO TABS
6.2500 mg | ORAL_TABLET | Freq: Two times a day (BID) | ORAL | Status: DC
  Administered 2019-10-03 – 2019-10-08 (×12): 6.25 mg via ORAL
  Filled 2019-10-03 (×12): qty 1

## 2019-10-03 MED ORDER — CARVEDILOL 3.125 MG PO TABS
3.1250 mg | ORAL_TABLET | Freq: Once | ORAL | Status: AC
  Administered 2019-10-03: 10:00:00 3.125 mg via ORAL
  Filled 2019-10-03: qty 1

## 2019-10-03 NOTE — Progress Notes (Signed)
Progress Note  Patient Name: Nicholas Porter Date of Encounter: 10/03/2019  Primary Cardiologist: Glenetta Hew, MD   Subjective   He feels well, denies chest pain or SOB.  Inpatient Medications    Scheduled Meds: . aspirin EC  81 mg Oral Daily  . carvedilol  3.125 mg Oral Once  . carvedilol  6.25 mg Oral BID WC  . Chlorhexidine Gluconate Cloth  6 each Topical Daily  . donepezil  10 mg Oral QHS  . feeding supplement (ENSURE ENLIVE)  237 mL Oral TID BM  . isosorbide mononitrate  60 mg Oral Daily  . latanoprost  1 drop Both Eyes QHS  . levothyroxine  25 mcg Oral QAC breakfast  . mouth rinse  15 mL Mouth Rinse BID  . sodium bicarbonate  650 mg Oral QID  . sodium chloride flush  3 mL Intravenous Q12H  . ticagrelor  90 mg Oral BID   Continuous Infusions: . sodium chloride    . sodium chloride 75 mL/hr at 10/03/19 0700   PRN Meds: sodium chloride, acetaminophen, hydrALAZINE, influenza vaccine adjuvanted, morphine injection, nitroGLYCERIN, ondansetron (ZOFRAN) IV, sodium chloride flush   Vital Signs    Vitals:   10/03/19 0700 10/03/19 0804 10/03/19 0900 10/03/19 0950  BP: (!) 152/99 (!) 183/65 (!) 177/65 (!) 177/65  Pulse: (!) 57 68 69   Resp: (!) 25  (!) 21   Temp:  98.2 F (36.8 C)    TempSrc:  Oral    SpO2:   97%   Weight:      Height:        Intake/Output Summary (Last 24 hours) at 10/03/2019 0953 Last data filed at 10/03/2019 0700 Gross per 24 hour  Intake 2667.94 ml  Output 575 ml  Net 2092.94 ml   Last 3 Weights 10/02/2019 10/01/2019 07/19/2019  Weight (lbs) 94 lb 5.7 oz 89 lb 92 lb 4 oz  Weight (kg) 42.8 kg 40.37 kg 41.844 kg      Telemetry    NSR - Personally Reviewed  ECG    NSR. ST elevation improved in lateral leads.- Personally Reviewed  Physical Exam   GEN: Elderly WM No acute distress.   Neck: No JVD Cardiac: RRR, gr 3/6 harsh systolic murmur RUSB radiating to apex and to the carotids. no rubs, or gallops.  Respiratory: Clear to  auscultation bilaterally. GI: Soft, nontender, non-distended  MS: No edema; No deformity. Right TR band on. Bruising but no hematoma Neuro:  Nonfocal  Psych: Normal affect   Labs    High Sensitivity Troponin:   Recent Labs  Lab 10/01/19 0326 10/01/19 0702 10/01/19 1259 10/01/19 1601  TROPONINIHS 34* 7,701* >27,000* >27,000*      Chemistry Recent Labs  Lab 10/01/19 0326  10/01/19 0702 10/02/19 0348 10/03/19 0238  NA 141   < > 140 142 139  K 4.9   < > 5.0 5.0 5.3*  CL 113*  --  113* 113* 113*  CO2 14*  --  11* 18* 19*  GLUCOSE 145*  --  188* 125* 100*  BUN 50*  --  46* 58* 66*  CREATININE 2.39*  --  2.35* 2.50* 2.45*  CALCIUM 9.7  --  9.1 9.2 8.4*  PROT 6.7  --  6.2*  --   --   ALBUMIN 3.8  --  3.4*  --   --   AST 17  --  59*  --   --   ALT 12  --  17  --   --  ALKPHOS 91  --  91  --   --   BILITOT 0.7  --  1.1  --   --   GFRNONAA 23*  --  24* 22* 23*  GFRAA 27*  --  28* 26* 26*  ANIONGAP 14  --  16* 11 7   < > = values in this interval not displayed.     Hematology Recent Labs  Lab 10/01/19 0319 10/01/19 0409 10/02/19 0348  WBC 6.8  --  9.3  RBC 3.50*  --  2.84*  HGB 12.1* 11.6* 10.0*  HCT 35.4* 34.0* 28.0*  MCV 101.1*  --  98.6  MCH 34.6*  --  35.2*  MCHC 34.2  --  35.7  RDW 14.3  --  14.4  PLT 102*  --  97*    BNPNo results for input(s): BNP, PROBNP in the last 168 hours.   DDimer No results for input(s): DDIMER in the last 168 hours.   Radiology    No results found.  Cardiac Studies   Coronary/Graft Acute MI Revascularization  LEFT HEART CATH AND CORONARY ANGIOGRAPHY  Conclusion    Prox RCA to Mid RCA lesion is 20% stenosed.  Mid RCA lesion is 95% stenosed.  Mid RCA to Dist RCA lesion is 40% stenosed.  Prox Cx lesion is 50% stenosed.  1st Diag lesion is 99% stenosed.  Prox LAD lesion is 50% stenosed.  Post intervention, there is a 20% residual stenosis.  Balloon angioplasty was performed using a BALLOON SAPPHIRE 2.0X12.    1. Acute anterolateral STEMI secondary to thrombotic occlusion of the small to moderate caliber first Diagonal branch 2. Successful PTCA with balloon angioplasty of the Diagonal branch. No stent was placed given the relatively small size of the vessel. Flow was established down the Diagonal with balloon angioplasty only. 3. Moderate mid LAD stenosis at the takeoff of the Diagonal branch which does not appear to be flow limiting 4. Moderate mid proximal to mid Circumflex stenosis which does not appear to be flow limiting.  5. The RCA is a large dominant vessel with a patent mid stented segment with mild stent restenosis. There is a severe stenosis in the mid RCA just beyond the old stented segment.   Recommendations: Will reload Brilinta 180 mg po x 1 in the ICU as the patient had an episode of emesis prior to leaving the cath lab. Will continue Aggrastat drip for 18 hours given thrombotic burden. Will continue ASA/Brilinta for 12 months. Consider starting a beta blocker later today pending his BP. He has apparently had intolerance to statins in the past (itching noted in record). Will explore this and consider another statin prior to discharge. I would anticipate that we will gently hydrate over the next 24 hours and if renal function remains stable, will plan staged PCI of the RCA next week.      TTE: 10/01/2019  1. Left ventricular ejection fraction, by visual estimation, is 45 to 50%. The left ventricle has mildly decreased function. Normal left ventricular size. There is no left ventricular hypertrophy.  2. Apical septal segment and apex are abnormal.  3. Left ventricular diastolic Doppler parameters are consistent with impaired relaxation pattern of LV diastolic filling.  4. Global right ventricle has normal systolic function.The right ventricular size is normal. No increase in right ventricular wall thickness.  5. Left atrial size was normal.  6. Right atrial size was normal.  7. Moderate  mitral annular calcification.  8. The mitral valve is abnormal.  Trace mitral valve regurgitation. Mild mitral stenosis.  9. The tricuspid valve is normal in structure. Tricuspid valve regurgitation was not visualized by color flow Doppler. 10. Aortic valve mean gradient measures 19.3 mmHg. 11. Aortic valve peak gradient measures 33.8 mmHg. 12. The aortic valve is bicuspid Aortic valve regurgitation is mild by color flow Doppler. Moderate aortic valve stenosis. 13. There is Moderate thickening of the aortic valve. 14. There is Severe calcifcation of the aortic valve. 15. The pulmonic valve was normal in structure. Pulmonic valve regurgitation is not visualized by color flow Doppler. 16. Normal pulmonary artery systolic pressure. 17. The inferior vena cava is normal in size with greater than 50% respiratory variability, suggesting right atrial pressure of 3 mmHg.  Patient Profile     85 y.o. male with known CAD s/p PCI of RCA, OM and presents with severe SSCP found to have anterolateral STEMI. He is s/p PCI of the of D1 for 99% mid lesion.   Assessment & Plan    1. CAD s/p acute lateral STEMI -- s/p POBA of diagonal branch about 2 hours ago. -- ASA 81 mg daily -- on Brilinta -- Lipids, a1c to risk stratify. On high dose statin -- BP remains elevated. Will increase carvedilol to 6.25 mg PO BID, added imdur 60 mg po daily yesterday -- no a candidate for ACEi/ARB due to CKD -- he has residual high grade stenosis in the mid RCA distal to prior stent. Consider staged PCI next week if renal function stable, I have added iv fluids, but Crea still 2.45, we will keep NPO and decide in the am about the procedure  - echo shows LVEF 45-50%, previously 55-60% in 12/2018  2. Metabolic Acidosis -- lactate 3.2  -- suspect this is related to CKD. Bicarb was 15 last January --no evidence of infection and he is hypertensive.  -- will start sodium bicarbonate  3. AKI on CKD stage IV -- Cr today 2.45 up  from 1.9 -- Would continue hydration -- Will send UA -- Used 200 cc of contrast during case -- will need to monitor renal function closely  4. 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   5. Anemia of chronic disease. Stable.   6. Aortic stenosis. Moderate by Echo in January and yesterday.  7. HTN  8. S/p left CEA  9. History of dementia.  For questions or updates, please contact [place] Please consult [email or web address] for contact info under     Signed, Ena Dawley, MD  10/03/2019, 9:53 AM

## 2019-10-04 LAB — BASIC METABOLIC PANEL
Anion gap: 10 (ref 5–15)
BUN: 70 mg/dL — ABNORMAL HIGH (ref 8–23)
CO2: 17 mmol/L — ABNORMAL LOW (ref 22–32)
Calcium: 8.5 mg/dL — ABNORMAL LOW (ref 8.9–10.3)
Chloride: 115 mmol/L — ABNORMAL HIGH (ref 98–111)
Creatinine, Ser: 2.61 mg/dL — ABNORMAL HIGH (ref 0.61–1.24)
GFR calc Af Amer: 24 mL/min — ABNORMAL LOW (ref >60)
GFR calc non Af Amer: 21 mL/min — ABNORMAL LOW (ref >60)
Glucose, Bld: 138 mg/dL — ABNORMAL HIGH (ref 70–99)
Potassium: 4.5 mmol/L (ref 3.5–5.1)
Sodium: 142 mmol/L (ref 135–145)

## 2019-10-04 MED FILL — Nitroglycerin IV Soln 100 MCG/ML in D5W: INTRA_ARTERIAL | Qty: 10 | Status: AC

## 2019-10-04 NOTE — Progress Notes (Signed)
Progress Note  Patient Name: Nicholas Porter Date of Encounter: 10/04/2019  Primary Cardiologist: Glenetta Hew, MD   Subjective   No chest pain.  Feels bad in general.  Feels that he "may not make it."  Inpatient Medications    Scheduled Meds: . aspirin EC  81 mg Oral Daily  . carvedilol  6.25 mg Oral BID WC  . Chlorhexidine Gluconate Cloth  6 each Topical Daily  . donepezil  10 mg Oral QHS  . feeding supplement (ENSURE ENLIVE)  237 mL Oral TID BM  . isosorbide mononitrate  60 mg Oral Daily  . latanoprost  1 drop Both Eyes QHS  . levothyroxine  25 mcg Oral QAC breakfast  . mouth rinse  15 mL Mouth Rinse BID  . sodium bicarbonate  650 mg Oral QID  . sodium chloride flush  3 mL Intravenous Q12H  . ticagrelor  90 mg Oral BID   Continuous Infusions: . sodium chloride    . sodium chloride 75 mL/hr at 10/04/19 0852   PRN Meds: sodium chloride, acetaminophen, hydrALAZINE, influenza vaccine adjuvanted, morphine injection, nitroGLYCERIN, ondansetron (ZOFRAN) IV, sodium chloride flush   Vital Signs    Vitals:   10/04/19 0700 10/04/19 0757 10/04/19 0800 10/04/19 0815  BP: (!) 164/67 (!) 164/67 (!) 167/69   Pulse: 62  67 66  Resp: (!) 29  (!) 26 (!) 21  Temp:  98.9 F (37.2 C)    TempSrc:  Oral    SpO2: 96%  96% 97%  Weight:      Height:        Intake/Output Summary (Last 24 hours) at 10/04/2019 1033 Last data filed at 10/04/2019 0800 Gross per 24 hour  Intake 1737.34 ml  Output 650 ml  Net 1087.34 ml   Last 3 Weights 10/04/2019 10/02/2019 10/01/2019  Weight (lbs) 99 lb 94 lb 5.7 oz 89 lb  Weight (kg) 44.906 kg 42.8 kg 40.37 kg      Telemetry    NSR - Personally Reviewed  ECG    None today - Personally Reviewed  Physical Exam   GEN: No acute distress.  Frail Neck: No JVD Cardiac: RRR, 2/6 systolic murmur, no rubs, or gallops.  Respiratory: Clear to auscultation bilaterally. GI: Soft, nontender, non-distended  MS: No edema; No deformity. Neuro:   Nonfocal  Psych: flat affect   Labs    High Sensitivity Troponin:   Recent Labs  Lab 10/01/19 0326 10/01/19 0702 10/01/19 1259 10/01/19 1601  TROPONINIHS 34* 7,701* >27,000* >27,000*      Chemistry Recent Labs  Lab 10/01/19 0326  10/01/19 0702 10/02/19 0348 10/03/19 0238 10/04/19 0243  NA 141   < > 140 142 139 142  K 4.9   < > 5.0 5.0 5.3* 4.5  CL 113*  --  113* 113* 113* 115*  CO2 14*  --  11* 18* 19* 17*  GLUCOSE 145*  --  188* 125* 100* 138*  BUN 50*  --  46* 58* 66* 70*  CREATININE 2.39*  --  2.35* 2.50* 2.45* 2.61*  CALCIUM 9.7  --  9.1 9.2 8.4* 8.5*  PROT 6.7  --  6.2*  --   --   --   ALBUMIN 3.8  --  3.4*  --   --   --   AST 17  --  59*  --   --   --   ALT 12  --  17  --   --   --   [facility]  91  --  91  --   --   --   BILITOT 0.7  --  1.1  --   --   --   GFRNONAA 23*  --  24* 22* 23* 21*  GFRAA 27*  --  28* 26* 26* 24*  ANIONGAP 14  --  16* 11 7 10    < > = values in this interval not displayed.     Hematology Recent Labs  Lab 10/01/19 0319 10/01/19 0409 10/02/19 0348  WBC 6.8  --  9.3  RBC 3.50*  --  2.84*  HGB 12.1* 11.6* 10.0*  HCT 35.4* 34.0* 28.0*  MCV 101.1*  --  98.6  MCH 34.6*  --  35.2*  MCHC 34.2  --  35.7  RDW 14.3  --  14.4  PLT 102*  --  97*    BNPNo results for input(s): BNP, PROBNP in the last 168 hours.   DDimer No results for input(s): DDIMER in the last 168 hours.   Radiology    No results found.  Cardiac Studies   Cath showed residual RCA disease post diagonal PTCA  Patient Profile     85 y.o. male with CAD, recent anterolateral MI  Assessment & Plan    1) CAD:  Plan for PCI of the RCA when renal function improves, baseline is likely between 2-2.4.  Encourage PO intake. Cr up to 2.6.  Will recheck in AM.  Would have to limit dye exposure at the time of cath.   BP control.  High dose statin.    Consider move to tele later today.  I spoke to his wife.   For questions or updates, please contact [place]  Please consult [email or web address] for contact info under        Signed, Larae Grooms, MD  10/04/2019, 10:33 AM

## 2019-10-04 NOTE — Progress Notes (Signed)
CARDIAC REHAB PHASE I   PRE:  Rate/Rhythm: 59 SR with PACs    BP: sitting 151/78    SaO2: 97 RA  MODE:  Ambulation: around foot of bed to recliner   POST:  Rate/Rhythm: 84 SR with PACs    BP: sitting 141/64     SaO2: 97 RA  Pt in bed on arrival. Very focused on his ailments. Eventually encouraged him to walk around bed to recliner with RW, assist x2. Frail and weak with difficulty seeing. He was wet from leaking condom cath and was able to stand for several minutes at the recliner while being cleaned. He sts he felt some SOB walking. Pt will need PT c/s for d/c planning. Sounds like his wife cares for him some. His children work.  [id number]   [place], ACSM 10/04/2019 12:02 PM

## 2019-10-05 LAB — BASIC METABOLIC PANEL
Anion gap: 12 (ref 5–15)
BUN: 65 mg/dL — ABNORMAL HIGH (ref 8–23)
CO2: 16 mmol/L — ABNORMAL LOW (ref 22–32)
Calcium: 8.7 mg/dL — ABNORMAL LOW (ref 8.9–10.3)
Chloride: 117 mmol/L — ABNORMAL HIGH (ref 98–111)
Creatinine, Ser: 2.57 mg/dL — ABNORMAL HIGH (ref 0.61–1.24)
GFR calc Af Amer: 25 mL/min — ABNORMAL LOW (ref >60)
GFR calc non Af Amer: 22 mL/min — ABNORMAL LOW (ref >60)
Glucose, Bld: 179 mg/dL — ABNORMAL HIGH (ref 70–99)
Potassium: 4.4 mmol/L (ref 3.5–5.1)
Sodium: 145 mmol/L (ref 135–145)

## 2019-10-05 MED ORDER — SODIUM CHLORIDE 0.9 % WEIGHT BASED INFUSION
3.0000 mL/kg/h | INTRAVENOUS | Status: AC
  Administered 2019-10-06: 3 mL/kg/h via INTRAVENOUS

## 2019-10-05 MED ORDER — SODIUM CHLORIDE 0.9 % WEIGHT BASED INFUSION
1.0000 mL/kg/h | INTRAVENOUS | Status: DC
  Administered 2019-10-06: 1 mL/kg/h via INTRAVENOUS

## 2019-10-05 MED ORDER — HYDRALAZINE HCL 25 MG PO TABS
25.0000 mg | ORAL_TABLET | Freq: Three times a day (TID) | ORAL | Status: DC
  Administered 2019-10-05 – 2019-10-08 (×11): 25 mg via ORAL
  Filled 2019-10-05 (×11): qty 1

## 2019-10-05 NOTE — Progress Notes (Addendum)
Paged Cards d/t persistent SBP > 165 ... Hydralazine 10 mg given around 0150 w/no relief.  Dr. Paticia Stack called and gave verb order to give scheduled carvedilol now.

## 2019-10-05 NOTE — Progress Notes (Signed)
Physical therapy attempted to walk patient. Ambulated approximately 50 feet before HR and BP declined. Patient placed in chair. Then became unresponsive. Sternal rub applied with minimal response. Patient moved back to room immediately. Pulse regained with 2 minutes without inverventions and patient became responsive. 4L [place] applied. Cardiology MD made aware. Patient is currently in bed. HR 79, BP 188/89 and sats 98%. Patient appears to be at neuro baseline. Wife was present for event and is currently at bedside with patient.

## 2019-10-05 NOTE — Progress Notes (Addendum)
Progress Note  Patient Name: Nicholas Porter Date of Encounter: 10/05/2019  Primary Cardiologist: Glenetta Hew, MD   Subjective   Examined at bedside this morning.  He appears weak but denies chest pain, shortness of breath.  We were able to set his breakfast tray and watch him eat breakfast.  Inpatient Medications    Scheduled Meds: . aspirin EC  81 mg Oral Daily  . carvedilol  6.25 mg Oral BID WC  . Chlorhexidine Gluconate Cloth  6 each Topical Daily  . donepezil  10 mg Oral QHS  . feeding supplement (ENSURE ENLIVE)  237 mL Oral TID BM  . isosorbide mononitrate  60 mg Oral Daily  . latanoprost  1 drop Both Eyes QHS  . levothyroxine  25 mcg Oral QAC breakfast  . mouth rinse  15 mL Mouth Rinse BID  . sodium bicarbonate  650 mg Oral QID  . sodium chloride flush  3 mL Intravenous Q12H  . ticagrelor  90 mg Oral BID   Continuous Infusions: . sodium chloride    . sodium chloride 75 mL/hr at 10/05/19 0600   PRN Meds: sodium chloride, acetaminophen, hydrALAZINE, influenza vaccine adjuvanted, morphine injection, nitroGLYCERIN, ondansetron (ZOFRAN) IV, sodium chloride flush   Vital Signs    Vitals:   10/05/19 0404 10/05/19 0500 10/05/19 0533 10/05/19 0600  BP:  (!) 206/73 (!) 212/90 (!) 177/87  Pulse:  70 77 66  Resp:  (!) 36  (!) 31  Temp: 98 F (36.7 C)     TempSrc: Oral     SpO2:  93%  97%  Weight:      Height:        Intake/Output Summary (Last 24 hours) at 10/05/2019 0658 Last data filed at 10/05/2019 0600 Gross per 24 hour  Intake 2375.05 ml  Output 780 ml  Net 1595.05 ml   Filed Weights   10/01/19 0316 10/02/19 0533 10/04/19 0600  Weight: 40.4 kg 42.8 kg 44.9 kg    Telemetry    Episode of bradycardia yesterday morning- Personally Reviewed  ECG    None yesterday- Personally Reviewed  Physical Exam   GEN: No acute distress but appears weak Cardiac: RRR, no murmurs, rubs, or gallops.  Respiratory: Clear to auscultation bilaterally. MS: No  edema; No deformity. Neuro:   Alert and oriented  Labs    Chemistry Recent Labs  Lab 10/01/19 0326  10/01/19 0702  10/03/19 0238 10/04/19 0243 10/05/19 0255  NA 141   < > 140   < > 139 142 145  K 4.9   < > 5.0   < > 5.3* 4.5 4.4  CL 113*  --  113*   < > 113* 115* 117*  CO2 14*  --  11*   < > 19* 17* 16*  GLUCOSE 145*  --  188*   < > 100* 138* 179*  BUN 50*  --  46*   < > 66* 70* 65*  CREATININE 2.39*  --  2.35*   < > 2.45* 2.61* 2.57*  CALCIUM 9.7  --  9.1   < > 8.4* 8.5* 8.7*  PROT 6.7  --  6.2*  --   --   --   --   ALBUMIN 3.8  --  3.4*  --   --   --   --   AST 17  --  59*  --   --   --   --   ALT 12  --  17  --   --   --   --  ALKPHOS 91  --  91  --   --   --   --   BILITOT 0.7  --  1.1  --   --   --   --   GFRNONAA 23*  --  24*   < > 23* 21* 22*  GFRAA 27*  --  28*   < > 26* 24* 25*  ANIONGAP 14  --  16*   < > 7 10 12    < > = values in this interval not displayed.     Hematology Recent Labs  Lab 10/01/19 0319 10/01/19 0409 10/02/19 0348  WBC 6.8  --  9.3  RBC 3.50*  --  2.84*  HGB 12.1* 11.6* 10.0*  HCT 35.4* 34.0* 28.0*  MCV 101.1*  --  98.6  MCH 34.6*  --  35.2*  MCHC 34.2  --  35.7  RDW 14.3  --  14.4  PLT 102*  --  97*    Cardiac EnzymesNo results for input(s): TROPONINI in the last 168 hours. No results for input(s): TROPIPOC in the last 168 hours.   BNPNo results for input(s): BNP, PROBNP in the last 168 hours.   DDimer No results for input(s): DDIMER in the last 168 hours.   Radiology    No results found.  Cardiac Studies   10/01/2019-echocardiogram   1. Left ventricular ejection fraction, by visual estimation, is 45 to 50%. The left ventricle has mildly decreased function. Normal left ventricular size. There is no left ventricular hypertrophy.  2. Apical septal segment and apex are abnormal.  3. Left ventricular diastolic Doppler parameters are consistent with impaired relaxation pattern of LV diastolic filling.  4. Global right ventricle  has normal systolic function.The right ventricular size is normal. No increase in right ventricular wall thickness.  5. Left atrial size was normal.  6. Right atrial size was normal.  7. Moderate mitral annular calcification.  8. The mitral valve is abnormal. Trace mitral valve regurgitation. Mild mitral stenosis.  9. The tricuspid valve is normal in structure. Tricuspid valve regurgitation was not visualized by color flow Doppler. 10. Aortic valve mean gradient measures 19.3 mmHg. 11. Aortic valve peak gradient measures 33.8 mmHg. 12. The aortic valve is bicuspid Aortic valve regurgitation is mild by color flow Doppler. Moderate aortic valve stenosis. 13. There is Moderate thickening of the aortic valve. 14. There is Severe calcifcation of the aortic valve. 15. The pulmonic valve was normal in structure. Pulmonic valve regurgitation is not visualized by color flow Doppler. 16. Normal pulmonary artery systolic pressure. 17. The inferior vena cava is normal in size with greater than 50% respiratory variability, suggesting right atrial pressure of 3 mmHg.   10/01/2019-left heart cath   Prox RCA to Mid RCA lesion is 20% stenosed.  Mid RCA lesion is 95% stenosed.  Mid RCA to Dist RCA lesion is 40% stenosed.  Prox Cx lesion is 50% stenosed.  1st Diag lesion is 99% stenosed.  Prox LAD lesion is 50% stenosed.  Post intervention, there is a 20% residual stenosis.  Balloon angioplasty was performed using a BALLOON SAPPHIRE 2.0X12.  Patient Profile     85 y.o. male with coronary artery disease status post PCI of RCA and OM who presented with chest pain and was found to have an anterior lateral STEMI.  He is now status post PCI of D1 due to 99% mid lesion.  Assessment & Plan    #Acute lateral STEMI status post balloon angioplasty #Coronary artery disease He  is doing well this morning and denies chest pain, shortness of breath.  He appears somewhat weak and has a long road of recovery  ahead of him. -Continue aspirin, Brilinta -Planned PCI of RCA when renal function improves  #HFrEF Echocardiogram revealed LVEF of 45-50% -Continue Coreg  #Non-anion gap metabolic acidosis #Acute on chronic kidney disease stage IV His baseline serum creatinine is ~2-2.4.  Bicarb this a.m. 16 -Continue p.o. sodium bicarb  #Hypertension BP has been elevated.  His a.m. BP is 212/90 -Add p.o. hydralazine 25mg  TID -IV Hydralazine as needed prn -Continue Coreg 6.25 mg twice daily -Continue Imdur 60 mg daily  #Hypothyroidism -Continue Synthroid  For questions or updates, please contact [place] Please consult [email or web address] for contact info under Cardiology/STEMI.      Signed, Jean Rosenthal, MD  10/05/2019, 6:58 AM    I have examined the patient and reviewed assessment and plan and discussed with patient.  Agree with above as stated.  Encourage PO intake.    D/w Dr. Angelena Form.  Will try for PCI of RCA tomorrow if Cr stable or downtrending.  Straightforward lesion that will hopefully not require much dye.    Titrating hydralazine for BP.   Larae Grooms   Discussed case with wife at the bedside after his episode of lightheadedness.  Patient will eat french fries and drink water from home.  OK to expand diet to help him with PO intake.   Jettie Booze, MD

## 2019-10-05 NOTE — Progress Notes (Signed)
MD Irish Lack to bedside to evaluate patient and discuss patient status and plan of care with patient and patient's wife.   Patient resting but is alert and oriented at baseline mental status.  Patient's vital signs stable with heart rate 63, blood pressure 151/66, and oxygen saturation at 91%.      Plan of care is for patient to have heart catheterization on 10/06/2019, MD [place] hopes for patient to go to the cath lab by 0830 on 10/06/2019.  Patient's wife expressed concerns about visitation hours as she would like to see patient before procedure.  MD Irish Lack is going to look at cath lab schedule to try and get a set time for procedure.  RN and MD advised patient's wife that she will be able to visit earlier than set visitation time as patient will be going to the cath lab.   MD Irish Lack advised patient to stay in ICU tonight, transfer orders cancelled.

## 2019-10-05 NOTE — Evaluation (Signed)
Physical Therapy Evaluation Patient Details Name: Nicholas Porter MRN: [id number] DOB: [birth year] Today's Date: 10/05/2019   History of Present Illness  85 y.o. male with known CAD s/p PCI of RCA, OM and presents with severe SSCP found to have anterolateral STEMI. He is s/p PCI of the of D1 for 99% mid lesion.  See chart for further PMH.  Clinical Impression  Patient presents with decreased mobility due to generalized weakness.  Medical issues during session after walking about 38' developed SOB and pt sat to rest, then developed bradycardia down to low 30's and nursing staff assisted to lift into recliner and pt reclined and assisted to room and back to bed, recovered HR and BP WNL.  Left in bed with nursing and wife at bedside with MD notified.  Feel functionally pt should progress and be able to return home with family support and follow up HHPT once medically stable.     Follow Up Recommendations Home health PT;Supervision/Assistance - 24 hour    Equipment Recommendations  None recommended by PT    Recommendations for Other Services       Precautions / Restrictions Precautions Precautions: Fall Precaution Comments: watch HR      Mobility  Bed Mobility Overal bed mobility: Needs Assistance Bed Mobility: Supine to Sit     Supine to sit: Supervision;HOB elevated     General bed mobility comments: assist for safety with lines  Transfers Overall transfer level: Needs assistance Equipment used: Rolling walker (2 wheeled) Transfers: Sit to/from Stand Sit to Stand: Min guard         General transfer comment: cues for safety, assist for balance  Ambulation/Gait                Stairs            Wheelchair Mobility    Modified Rankin (Stroke Patients Only)       Balance Overall balance assessment: Needs assistance Sitting-balance support: Feet supported Sitting balance-Leahy Scale: Fair     Standing balance support: Bilateral upper extremity  supported Standing balance-Leahy Scale: Poor Standing balance comment: initially leaning back onto bed bracing with backs of legs for balance                             Pertinent Vitals/Pain Pain Assessment: No/denies pain    Home Living Family/patient expects to be discharged to:: Private residence Living Arrangements: Spouse/significant other Available Help at Discharge: Family Type of Home: House Home Access: Stairs to enter Entrance Stairs-Rails: (none in front but rails in back) Entrance Stairs-Number of Steps: 2 Home Layout: One level;Laundry or work area in basement(15 steps to basement no rails) Home Equipment: Environmental consultant - 2 wheels;Cane - single point;Bedside commode      Prior Function Level of Independence: Independent         Comments: has been sponge bathing,  Has some recent falls when first getting up wife has been helping steady him initially upon standing     Hand Dominance   Dominant Hand: Right    Extremity/Trunk Assessment   Upper Extremity Assessment Upper Extremity Assessment: Generalized weakness    Lower Extremity Assessment Lower Extremity Assessment: Generalized weakness    Cervical / Trunk Assessment Cervical / Trunk Assessment: Other exceptions Cervical / Trunk Exceptions: frail and thin  Communication   Communication: HOH  Cognition Arousal/Alertness: Awake/alert Behavior During Therapy: WFL for tasks assessed/performed Overall Cognitive Status: History of cognitive impairments - at  baseline                                 General Comments: A&O x 4 likely some baseline memory issues wife assisted to give home information      General Comments General comments (skin integrity, edema, etc.): Wife present and requesting not to go to rehab as he did when in [place].  Patient with syncopal episode during session se PT clinical impression for details.    Exercises     Assessment/Plan    PT Assessment  Patient needs continued PT services  PT Problem List Decreased strength;Decreased activity tolerance;Decreased mobility;Decreased balance;Cardiopulmonary status limiting activity       PT Treatment Interventions DME instruction;Therapeutic activities;Balance training;Stair training;Functional mobility training;Gait training;Therapeutic exercise;Patient/family education    PT Goals (Current goals can be found in the Care Plan section)  Acute Rehab PT Goals Patient Stated Goal: to go home PT Goal Formulation: With family Time For Goal Achievement: 10/19/19 Potential to Achieve Goals: Fair    Frequency Min 3X/week   Barriers to discharge        Co-evaluation               AM-PAC PT "6 Clicks" Mobility  Outcome Measure Help needed turning from your back to your side while in a flat bed without using bedrails?: None Help needed moving from lying on your back to sitting on the side of a flat bed without using bedrails?: A Little Help needed moving to and from a bed to a chair (including a wheelchair)?: A Little Help needed standing up from a chair using your arms (e.g., wheelchair or bedside chair)?: A Little Help needed to walk in hospital room?: A Little Help needed climbing 3-5 steps with a railing? : A Little 6 Click Score: 19    End of Session Equipment Utilized During Treatment: Gait belt;Oxygen Activity Tolerance: Treatment limited secondary to medical complications (Comment) Patient left: in bed;with nursing/sitter in room;with family/visitor present   PT Visit Diagnosis: Muscle weakness (generalized) (M62.81);History of falling (Z91.81)    Time: 1130-1151 PT Time Calculation (min) (ACUTE ONLY): 21 min   Charges:   PT Evaluation $PT Eval Moderate Complexity: [place], [place] Acute Rehabilitation Services [phone] 10/05/2019   Reginia Naas 10/05/2019, 12:35 PM

## 2019-10-06 ENCOUNTER — Encounter (HOSPITAL_COMMUNITY): Payer: Self-pay | Admitting: Cardiovascular Disease

## 2019-10-06 DIAGNOSIS — D649 Anemia, unspecified: Secondary | ICD-10-CM

## 2019-10-06 LAB — BASIC METABOLIC PANEL
Anion gap: 9 (ref 5–15)
BUN: 70 mg/dL — ABNORMAL HIGH (ref 8–23)
CO2: 16 mmol/L — ABNORMAL LOW (ref 22–32)
Calcium: 8.4 mg/dL — ABNORMAL LOW (ref 8.9–10.3)
Chloride: 120 mmol/L — ABNORMAL HIGH (ref 98–111)
Creatinine, Ser: 2.53 mg/dL — ABNORMAL HIGH (ref 0.61–1.24)
GFR calc Af Amer: 25 mL/min — ABNORMAL LOW (ref >60)
GFR calc non Af Amer: 22 mL/min — ABNORMAL LOW (ref >60)
Glucose, Bld: 116 mg/dL — ABNORMAL HIGH (ref 70–99)
Potassium: 4.2 mmol/L (ref 3.5–5.1)
Sodium: 145 mmol/L (ref 135–145)

## 2019-10-06 LAB — CBC
HCT: 23.9 % — ABNORMAL LOW (ref 39.0–52.0)
HCT: 25.1 % — ABNORMAL LOW (ref 39.0–52.0)
Hemoglobin: 7.9 g/dL — ABNORMAL LOW (ref 13.0–17.0)
Hemoglobin: 8.1 g/dL — ABNORMAL LOW (ref 13.0–17.0)
MCH: 34.5 pg — ABNORMAL HIGH (ref 26.0–34.0)
MCH: 33.8 pg (ref 26.0–34.0)
MCHC: 33.1 g/dL (ref 30.0–36.0)
MCHC: 32.3 g/dL (ref 30.0–36.0)
MCV: 104.4 fL — ABNORMAL HIGH (ref 80.0–100.0)
MCV: 104.6 fL — ABNORMAL HIGH (ref 80.0–100.0)
Platelets: 83 10*3/uL — ABNORMAL LOW (ref 150–400)
Platelets: 88 10*3/uL — ABNORMAL LOW (ref 150–400)
RBC: 2.29 MIL/uL — ABNORMAL LOW (ref 4.22–5.81)
RBC: 2.4 MIL/uL — ABNORMAL LOW (ref 4.22–5.81)
RDW: 14.7 % (ref 11.5–15.5)
RDW: 14.7 % (ref 11.5–15.5)
WBC: 6.3 10*3/uL (ref 4.0–10.5)
WBC: 6.3 10*3/uL (ref 4.0–10.5)
nRBC: 0 % (ref 0.0–0.2)
nRBC: 0 % (ref 0.0–0.2)

## 2019-10-06 LAB — OCCULT BLOOD X 1 CARD TO LAB, STOOL: Fecal Occult Bld: NEGATIVE

## 2019-10-06 NOTE — Progress Notes (Signed)
PT Cancellation Note  Patient Details Name: Siam Cleary MRN: [id number] DOB: Oct 08, 1930   Cancelled Treatment:    Reason Eval/Treat Not Completed: Medical issues which prohibited therapy; patient with anemia and not able to undergo cath today.  Will await medical stability prior to resuming PT.     Reginia Naas 10/06/2019, 11:51 AM  Magda Kiel, [place] [phone] 10/06/2019

## 2019-10-06 NOTE — Progress Notes (Addendum)
Progress Note  Patient Name: Nicholas Porter Date of Encounter: 10/06/2019  Primary Cardiologist: Glenetta Hew, MD   Subjective  Overnight: The reports of patient being bradycardic and hypotensive.  On assessment by the nurse, he was apparently gray but however spontaneously recovered.  He was assessed with his wife at bedside this morning.  He had a great night and currently denies chest pain or shortness of breath.  The family has been updated regarding the plan to evaluate the new decrease in his hemoglobin prior to any intervention.  Inpatient Medications    Scheduled Meds: . aspirin EC  81 mg Oral Daily  . carvedilol  6.25 mg Oral BID WC  . Chlorhexidine Gluconate Cloth  6 each Topical Daily  . donepezil  10 mg Oral QHS  . feeding supplement (ENSURE ENLIVE)  237 mL Oral TID BM  . hydrALAZINE  25 mg Oral Q8H  . isosorbide mononitrate  60 mg Oral Daily  . latanoprost  1 drop Both Eyes QHS  . levothyroxine  25 mcg Oral QAC breakfast  . mouth rinse  15 mL Mouth Rinse BID  . sodium bicarbonate  650 mg Oral QID  . sodium chloride flush  3 mL Intravenous Q12H  . ticagrelor  90 mg Oral BID   Continuous Infusions: . sodium chloride    . sodium chloride 47 mL/hr at 10/06/19 0600  . sodium chloride 1 mL/kg/hr (10/06/19 0458)   PRN Meds: sodium chloride, acetaminophen, hydrALAZINE, influenza vaccine adjuvanted, morphine injection, nitroGLYCERIN, ondansetron (ZOFRAN) IV, sodium chloride flush   Vital Signs    Vitals:   10/06/19 0300 10/06/19 0400 10/06/19 0500 10/06/19 0600  BP: (!) 156/64 (!) 158/68 (!) 167/67 (!) 161/74  Pulse: (!) 51 (!) 48 (!) 54 (!) 58  Resp: 20 (!) 25 20 (!) 21  Temp:  98 F (36.7 C)    TempSrc:  Oral    SpO2: 100% 100% 100% 100%  Weight:      Height:        Intake/Output Summary (Last 24 hours) at 10/06/2019 0654 Last data filed at 10/06/2019 0600 Gross per 24 hour  Intake 2064.99 ml  Output 750 ml  Net 1314.99 ml   Filed Weights   10/02/19 0533 10/04/19 0600 10/05/19 0600  Weight: 42.8 kg 44.9 kg 47.2 kg    Telemetry    Bradycardia, PVC- Personally Reviewed  ECG    None today- Personally Reviewed  Physical Exam   GEN: No acute distress.   Cardiac: RRR, no murmurs, rubs, or gallops.  Respiratory: Clear to auscultation bilaterally. GI: Soft, nontender, non-distended  Neuro:  Nonfocal  Psych: Normal affect   Labs    Chemistry Recent Labs  Lab 10/01/19 0326  10/01/19 0702  10/04/19 0243 10/05/19 0255 10/06/19 0458  NA 141   < > 140   < > 142 145 145  K 4.9   < > 5.0   < > 4.5 4.4 4.2  CL 113*  --  113*   < > 115* 117* 120*  CO2 14*  --  11*   < > 17* 16* 16*  GLUCOSE 145*  --  188*   < > 138* 179* 116*  BUN 50*  --  46*   < > 70* 65* 70*  CREATININE 2.39*  --  2.35*   < > 2.61* 2.57* 2.53*  CALCIUM 9.7  --  9.1   < > 8.5* 8.7* 8.4*  PROT 6.7  --  6.2*  --   --   --   --  ALBUMIN 3.8  --  3.4*  --   --   --   --   AST 17  --  59*  --   --   --   --   ALT 12  --  17  --   --   --   --   ALKPHOS 91  --  91  --   --   --   --   BILITOT 0.7  --  1.1  --   --   --   --   GFRNONAA 23*  --  24*   < > 21* 22* 22*  GFRAA 27*  --  28*   < > 24* 25* 25*  ANIONGAP 14  --  16*   < > 10 12 9    < > = values in this interval not displayed.     Hematology Recent Labs  Lab 10/01/19 0319 10/01/19 0409 10/02/19 0348 10/06/19 0458  WBC 6.8  --  9.3 6.3  RBC 3.50*  --  2.84* 2.29*  HGB 12.1* 11.6* 10.0* 7.9*  HCT 35.4* 34.0* 28.0* 23.9*  MCV 101.1*  --  98.6 104.4*  MCH 34.6*  --  35.2* 34.5*  MCHC 34.2  --  35.7 33.1  RDW 14.3  --  14.4 14.7  PLT 102*  --  97* 83*    Cardiac EnzymesNo results for input(s): TROPONINI in the last 168 hours. No results for input(s): TROPIPOC in the last 168 hours.   BNPNo results for input(s): BNP, PROBNP in the last 168 hours.   DDimer No results for input(s): DDIMER in the last 168 hours.   Radiology    No results found.  Cardiac Studies    10/01/2019-echocardiogram  1. Left ventricular ejection fraction, by visual estimation, is 45 to 50%. The left ventricle has mildly decreased function. Normal left ventricular size. There is no left ventricular hypertrophy. 2. Apical septal segment and apex are abnormal. 3. Left ventricular diastolic Doppler parameters are consistent with impaired relaxation pattern of LV diastolic filling. 4. Global right ventricle has normal systolic function.The right ventricular size is normal. No increase in right ventricular wall thickness. 5. Left atrial size was normal. 6. Right atrial size was normal. 7. Moderate mitral annular calcification. 8. The mitral valve is abnormal. Trace mitral valve regurgitation. Mild mitral stenosis. 9. The tricuspid valve is normal in structure. Tricuspid valve regurgitation was not visualized by color flow Doppler. 10. Aortic valve mean gradient measures 19.3 mmHg. 11. Aortic valve peak gradient measures 33.8 mmHg. 12. The aortic valve is bicuspid Aortic valve regurgitation is mild by color flow Doppler. Moderate aortic valve stenosis. 13. There is Moderate thickening of the aortic valve. 14. There is Severe calcifcation of the aortic valve. 15. The pulmonic valve was normal in structure. Pulmonic valve regurgitation is not visualized by color flow Doppler. 16. Normal pulmonary artery systolic pressure. 17. The inferior vena cava is normal in size with greater than 50% respiratory variability, suggesting right atrial pressure of 3 mmHg.   10/01/2019-left heart cath   Prox RCA to Mid RCA lesion is 20% stenosed.  Mid RCA lesion is 95% stenosed.  Mid RCA to Dist RCA lesion is 40% stenosed.  Prox Cx lesion is 50% stenosed.  1st Diag lesion is 99% stenosed.  Prox LAD lesion is 50% stenosed.  Post intervention, there is a 20% residual stenosis.  Balloon angioplasty was performed using a BALLOON SAPPHIRE 2.0X12.  Patient Profile     85  y.o. male  with coronary artery disease status post PCI of RCA and OM who presented with chest pain and was found to have an anterior lateral STEMI.  He is now status post PCI of D1 due to 99% mid lesion.  Assessment & Plan    #Acute lateral STEMI status post balloon angioplasty #Coronary artery disease He has been progressing well and currently denies chest pain or shortness of breath.  His wife was at bedside during assessment today and she expressed understanding for his planned management today. -Continue aspirin, Brilinta -Planned PCI of RCA today  #HFrEF Echocardiogram revealed LVEF of 45-50% -Continue Coreg  #Macrocytic anemia He has had a 2 g drop in hemoglobin from about 4 days ago.  From 10 --> 9.  There is been no reports of GI bleed.  He does indicate of several drops of nosebleeds but denies any severe bleeding.  This could represent hemodilution and his IV fluids has thus been discontinued.  We will obtain stool occult and CBC in an hour to monitor hemoglobin.  Per chart review, his vitamin B12 in March of this year was unremarkable at 460.  An iron panel in January and February revealed low iron of 44 and low saturation of 6 with ferritin >100 -Follow-up stool occult and CBC  #Non-anion gap metabolic acidosis #Acute on chronic kidney disease stage IV sCr stable at 2.5 (baseline serum creatinine is ~2-2.4.). -Continue p.o. sodium bicarb -Continue to monitor   #Hypertension BP this am in the 160s/70s -Continue p.o. hydralazine 25mg  TID -IV Hydralazine as needed prn -Continue Coreg 6.25 mg twice daily -Continue Imdur 60 mg daily  For questions or updates, please contact [facility] Please consult [email or web address] for contact info under Cardiology/STEMI.      Signed, Jean Rosenthal, MD  10/06/2019, 6:54 AM    I have examined the patient and reviewed assessment and plan and discussed with patient.  Agree with above as stated.  Repeat Hbg was 8.1.  Need to hemoccult and look for  source of bleeding.  May be due to worsening renal function and anemia of chronic disease; owever, if a stent is placed, then he would be committed to [facility].  Will stop Brilinta now since he only had a PTCA.  If Hbg remains stable, will plan for restarting of antiplatelet therapy,  May consider PLavix monotherapy to reduce bleeding risk.    Plan discussed with the wife.  Larae Grooms

## 2019-10-06 NOTE — Progress Notes (Signed)
Received patient from [place] shortly after 1400. Patient upset and frequently states to RN "I hope I die tonight. I just want to die. I am so tired of this." RN asked patient if there was anything that would help him feel more comfortable and less agitated. Patient states "I just don't want to be in this hospital anymore. I just want to go home. I'm not going to get better; I am going blind and I've lost 150 pounds in a year." After further discussion, RN asked patient and wife if they would be open to a palliative care consult to help define patient's goals of care moving forward. Patient and wife agreeable although patient states "talking about it isn't going to help anything." Educated patient that the palliative medicine team could offer him a variety of resources based on his wishes. Also spoke with patient's granddaughter, Lelan Pons, with patient's permission. Lelan Pons is a paramedic. Lelan Pons states that patient has been declining at home and that she feels that a palliative care consult would be a good option for her grandfather. She indicates that she is concerned about the prospect of him undergoing further invasive procedures and that she does not want him to suffer.   Received verbal order from Dr. Irish Lack to place a consult to palliative care.

## 2019-10-06 NOTE — Progress Notes (Signed)
Nutrition Follow-up  DOCUMENTATION CODES:   Underweight, Severe malnutrition in context of chronic illness  INTERVENTION:   - Once diet advanced, continue Ensure Enlive po TID, each supplement provides 350 kcal and 20 grams of protein (strawberry flavor)  - Agree with Regular diet order  NUTRITION DIAGNOSIS:   Severe Malnutrition related to chronic illness (COPD, CKD stage IV, dementia) as evidenced by severe fat depletion, severe muscle depletion.  New diagnosis after completion of NFPE  GOAL:   Patient will meet greater than or equal to 90% of their needs  Progressing  MONITOR:   PO intake, Supplement acceptance, Labs, Weight trends  REASON FOR ASSESSMENT:   Other (underweight BMI)    ASSESSMENT:   85 year old male who presented to the ED on 10/9 with chest pain. Code STEMI. PMH of CKD stage IV, anemia, CAD, COPD, dementia, HTN, STEMI in 2014. Pt taken to the cath lab and is now s/p PCI of D1.  Noted plan for PCI of the right RCA today.  Weight up 10 lbs since admit. Suspect weight gain related to fluid status.  Spoke with pt and wife at bedside. Pt reporting frustration with waiting on procedure.  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 Pt states that since that time, he has had to eat smaller meals more frequently throughout the day.  Pt endorses weight loss that began about 1 year ago. Pt states his UBW is 150 lbs and that now he is down to 99 lbs.  Pt reports typically eating 3 meals daily. Pt denies drinking oral nutrition supplements at home but states he has been drinking them during admission. He prefers the [employer].  Breakfast: [employer] egg Lunch: bologna sandwich, sweet potato with butter Dinner: cheese sandwich and [place] fries  MD has given pt's wife permission to bring him food from home.  Meal Completion: 0-100% x last 8 recorded meals (averaging 53%)  Medications reviewed and include:  Ensure Enlive TID, sodium bicarb IVF: NS @ 10 ml/hr  Labs reviewed: hemoglobin 7.9  UOP: 750 ml x 24 hours I/O's: +6.4 L since admit  NUTRITION - FOCUSED PHYSICAL EXAM:    Most Recent Value  Orbital Region  Severe depletion  Upper Arm Region  Severe depletion  Thoracic and Lumbar Region  Severe depletion  Buccal Region  Severe depletion  Temple Region  Severe depletion  Clavicle Bone Region  Severe depletion  Clavicle and Acromion Bone Region  Severe depletion  Scapular Bone Region  Severe depletion  Dorsal Hand  Severe depletion  Patellar Region  Severe depletion  Anterior Thigh Region  Severe depletion  Posterior Calf Region  Severe depletion  Edema (RD Assessment)  None  Hair  Reviewed  Eyes  Reviewed  Mouth  Reviewed  Skin  Reviewed  Nails  Reviewed       Diet Order:   Diet Order            Diet regular Room service appropriate? Yes; Fluid consistency: Thin  Diet effective now              EDUCATION NEEDS:   Not appropriate for education at this time  Skin:  Skin Assessment: Reviewed RN Assessment  Last BM:  10/04/19  Height:   Ht Readings from Last 1 Encounters:  10/02/19 5\' 7"  (1.702 m)    Weight:   Wt Readings from Last 1 Encounters:  10/05/19 47.2 kg    Ideal Body  Weight:  67.3 kg  BMI:  Body mass index is 16.3 kg/m.  Estimated Nutritional Needs:   Kcal:  1550-1750  Protein:  65-80 grams  Fluid:  >/= 1.5 L    Gaynell Face, MS, RD, LDN Inpatient Clinical Dietitian Pager: [phone] Weekend/After Hours: [phone]

## 2019-10-07 ENCOUNTER — Encounter (HOSPITAL_COMMUNITY)
Admission: EM | Disposition: A | Discharge status: Home Health | Payer: Self-pay | Source: Home / Self Care | Attending: Cardiovascular Disease

## 2019-10-07 DIAGNOSIS — Z515 Encounter for palliative care: Secondary | ICD-10-CM

## 2019-10-07 DIAGNOSIS — Z7189 Other specified counseling: Secondary | ICD-10-CM

## 2019-10-07 LAB — CBC
HCT: 22.8 % — ABNORMAL LOW (ref 39.0–52.0)
Hemoglobin: 7.6 g/dL — ABNORMAL LOW (ref 13.0–17.0)
MCH: 34.7 pg — ABNORMAL HIGH (ref 26.0–34.0)
MCHC: 33.3 g/dL (ref 30.0–36.0)
MCV: 104.1 fL — ABNORMAL HIGH (ref 80.0–100.0)
Platelets: 82 10*3/uL — ABNORMAL LOW (ref 150–400)
RBC: 2.19 MIL/uL — ABNORMAL LOW (ref 4.22–5.81)
RDW: 14.6 % (ref 11.5–15.5)
WBC: 5.7 10*3/uL (ref 4.0–10.5)
nRBC: 0 % (ref 0.0–0.2)

## 2019-10-07 LAB — BASIC METABOLIC PANEL
Anion gap: 9 (ref 5–15)
BUN: 66 mg/dL — ABNORMAL HIGH (ref 8–23)
CO2: 17 mmol/L — ABNORMAL LOW (ref 22–32)
Calcium: 8.1 mg/dL — ABNORMAL LOW (ref 8.9–10.3)
Chloride: 117 mmol/L — ABNORMAL HIGH (ref 98–111)
Creatinine, Ser: 2.32 mg/dL — ABNORMAL HIGH (ref 0.61–1.24)
GFR calc Af Amer: 28 mL/min — ABNORMAL LOW (ref >60)
GFR calc non Af Amer: 24 mL/min — ABNORMAL LOW (ref >60)
Glucose, Bld: 97 mg/dL (ref 70–99)
Potassium: 3.7 mmol/L (ref 3.5–5.1)
Sodium: 143 mmol/L (ref 135–145)

## 2019-10-07 LAB — FERRITIN: Ferritin: 100 ng/mL (ref 24–336)

## 2019-10-07 LAB — IRON AND TIBC
Iron: 31 ug/dL — ABNORMAL LOW (ref 45–182)
Saturation Ratios: 13 % — ABNORMAL LOW (ref 17.9–39.5)
TIBC: 245 ug/dL — ABNORMAL LOW (ref 250–450)
UIBC: 214 ug/dL

## 2019-10-07 LAB — ABO/RH: ABO/RH(D): A POS

## 2019-10-07 LAB — PREPARE RBC (CROSSMATCH)

## 2019-10-07 SURGERY — CORONARY STENT INTERVENTION
Anesthesia: LOCAL

## 2019-10-07 MED ORDER — ROSUVASTATIN CALCIUM 5 MG PO TABS
5.0000 mg | ORAL_TABLET | Freq: Every day | ORAL | Status: DC
  Administered 2019-10-07 – 2019-10-08 (×2): 5 mg via ORAL
  Filled 2019-10-07 (×2): qty 1

## 2019-10-07 MED ORDER — FUROSEMIDE 10 MG/ML IJ SOLN
20.0000 mg | Freq: Once | INTRAMUSCULAR | Status: AC
  Administered 2019-10-08: 20 mg via INTRAVENOUS
  Filled 2019-10-07: qty 2

## 2019-10-07 MED ORDER — SODIUM CHLORIDE 0.9% IV SOLUTION: Freq: Once | INTRAVENOUS | Status: DC

## 2019-10-07 MED ORDER — CLOPIDOGREL BISULFATE 75 MG PO TABS
75.0000 mg | ORAL_TABLET | Freq: Every day | ORAL | Status: DC
  Administered 2019-10-07 – 2019-10-08 (×2): 75 mg via ORAL
  Filled 2019-10-07 (×2): qty 1

## 2019-10-07 NOTE — Consult Note (Signed)
   [facility] CM Inpatient Consult   10/07/2019  Rahmon Klema 09-12-1930 [id number]    Patient screened for23% high risk scorefor unplanned readmission and hospitalization as a benefit fromhis Medicare/ Next[employer] ACO plan; and to check potential needs for [facility] care management services.   Chart review of patient's medical recordand MD history and physical on 10/01/19 reveal as: Nicholas Porter is a 85 y.o. male with history of inferior STEMI (2014), carotid disease s/p CEA, who presents with chest pain, found to have an anterior lateral STEMI.    He is now status post PCI of D1 due to99% mid lesion.  Primary Care Provider isNafziger, [name], NP with [facility], listed as providing transition of care follow-up.  Review of PT notes show currentrecommendation for home with home health PTvs.home with Hospice. Awaiting goals of care discussion per Palliative medicine per MD.  Plan:Will continue to follow progress and disposition to assess for post hospital care management needs.  Of note, [facility] Care Management services does not replace or interfere with any services that are arranged by transition of care case management or social work.   For questionsand referral, pleasecontact:  Edwena Felty A. Rilla Buckman, BSN, RN-BC [facility] Liaison Cell: [phone]

## 2019-10-07 NOTE — Progress Notes (Signed)
Received call from blood bank that there was an issue with patient's unit of blood. Blood bank states they will call when issue is resolved and blood is ready.

## 2019-10-07 NOTE — Progress Notes (Addendum)
Progress Note  Patient Name: Author Slaven Date of Encounter: 10/07/2019  Primary Cardiologist: Glenetta Hew, MD   Subjective   He states that he feels much better this morning but did have "a little trouble breathing yesterday because the room was stuffy.  "He denies chest pain or any current shortness of breath.  He was able to eat breakfast and drink his Ensure  Inpatient Medications    Scheduled Meds: . aspirin EC  81 mg Oral Daily  . carvedilol  6.25 mg Oral BID WC  . Chlorhexidine Gluconate Cloth  6 each Topical Daily  . donepezil  10 mg Oral QHS  . feeding supplement (ENSURE ENLIVE)  237 mL Oral TID BM  . hydrALAZINE  25 mg Oral Q8H  . isosorbide mononitrate  60 mg Oral Daily  . latanoprost  1 drop Both Eyes QHS  . levothyroxine  25 mcg Oral QAC breakfast  . mouth rinse  15 mL Mouth Rinse BID  . sodium bicarbonate  650 mg Oral QID  . sodium chloride flush  3 mL Intravenous Q12H   Continuous Infusions: . sodium chloride    . sodium chloride 10 mL/hr at 10/06/19 1200  . sodium chloride Stopped (10/06/19 0836)   PRN Meds: sodium chloride, acetaminophen, hydrALAZINE, influenza vaccine adjuvanted, morphine injection, nitroGLYCERIN, ondansetron (ZOFRAN) IV, sodium chloride flush   Vital Signs    Vitals:   10/06/19 1411 10/06/19 1706 10/06/19 2150 10/07/19 0652  BP: (!) 146/62 139/64 (!) 132/52 (!) 171/66  Pulse: 60 63  61  Resp: 18 (!) 24  (!) 31  Temp:    97.8 F (36.6 C)  TempSrc:    Oral  SpO2: 100% 100%  94%  Weight:    46 kg  Height:        Intake/Output Summary (Last 24 hours) at 10/07/2019 0717 Last data filed at 10/07/2019 0645 Gross per 24 hour  Intake 109.67 ml  Output 650 ml  Net -540.33 ml   Filed Weights   10/04/19 0600 10/05/19 0600 10/07/19 [id number]  Weight: 44.9 kg 47.2 kg 46 kg    Telemetry    Sinus bradycardia- Personally Reviewed  ECG    None today- Personally Reviewed  Physical Exam   GEN: No acute distress.   Neck: + JVP  Cardiac: RRR, no murmurs, rubs, or gallops.  Respiratory: Clear to auscultation bilaterally. MS: No edema; No deformity. Neuro:  Nonfocal  Psych: Normal affect   Labs    Chemistry Recent Labs  Lab 10/01/19 0326  10/01/19 0702  10/05/19 0255 10/06/19 0458 10/07/19 0339  NA 141   < > 140   < > 145 145 143  K 4.9   < > 5.0   < > 4.4 4.2 3.7  CL 113*  --  113*   < > 117* 120* 117*  CO2 14*  --  11*   < > 16* 16* 17*  GLUCOSE 145*  --  188*   < > 179* 116* 97  BUN 50*  --  46*   < > 65* 70* 66*  CREATININE 2.39*  --  2.35*   < > 2.57* 2.53* 2.32*  CALCIUM 9.7  --  9.1   < > 8.7* 8.4* 8.1*  PROT 6.7  --  6.2*  --   --   --   --   ALBUMIN 3.8  --  3.4*  --   --   --   --   AST 17  --  59*  --   --   --   --   ALT 12  --  17  --   --   --   --   ALKPHOS 91  --  91  --   --   --   --   BILITOT 0.7  --  1.1  --   --   --   --   GFRNONAA 23*  --  24*   < > 22* 22* 24*  GFRAA 27*  --  28*   < > 25* 25* 28*  ANIONGAP 14  --  16*   < > 12 9 9    < > = values in this interval not displayed.     Hematology Recent Labs  Lab 10/06/19 0458 10/06/19 0952 10/07/19 0339  WBC 6.3 6.3 5.7  RBC 2.29* 2.40* 2.19*  HGB 7.9* 8.1* 7.6*  HCT 23.9* 25.1* 22.8*  MCV 104.4* 104.6* 104.1*  MCH 34.5* 33.8 34.7*  MCHC 33.1 32.3 33.3  RDW 14.7 14.7 14.6  PLT 83* 88* 82*    Cardiac EnzymesNo results for input(s): TROPONINI in the last 168 hours. No results for input(s): TROPIPOC in the last 168 hours.   BNPNo results for input(s): BNP, PROBNP in the last 168 hours.   DDimer No results for input(s): DDIMER in the last 168 hours.   Radiology    No results found.  Cardiac Studies   10/01/2019-echocardiogram  1. Left ventricular ejection fraction, by visual estimation, is 45 to 50%. The left ventricle has mildly decreased function. Normal left ventricular size. There is no left ventricular hypertrophy. 2. Apical septal segment and apex are abnormal. 3. Left ventricular diastolic Doppler  parameters are consistent with impaired relaxation pattern of LV diastolic filling. 4. Global right ventricle has normal systolic function.The right ventricular size is normal. No increase in right ventricular wall thickness. 5. Left atrial size was normal. 6. Right atrial size was normal. 7. Moderate mitral annular calcification. 8. The mitral valve is abnormal. Trace mitral valve regurgitation. Mild mitral stenosis. 9. The tricuspid valve is normal in structure. Tricuspid valve regurgitation was not visualized by color flow Doppler. 10. Aortic valve mean gradient measures 19.3 mmHg. 11. Aortic valve peak gradient measures 33.8 mmHg. 12. The aortic valve is bicuspid Aortic valve regurgitation is mild by color flow Doppler. Moderate aortic valve stenosis. 13. There is Moderate thickening of the aortic valve. 14. There is Severe calcifcation of the aortic valve. 15. The pulmonic valve was normal in structure. Pulmonic valve regurgitation is not visualized by color flow Doppler. 16. Normal pulmonary artery systolic pressure. 17. The inferior vena cava is normal in size with greater than 50% respiratory variability, suggesting right atrial pressure of 3 mmHg.   10/01/2019-left heart cath   Prox RCA to Mid RCA lesion is 20% stenosed.  Mid RCA lesion is 95% stenosed.  Mid RCA to Dist RCA lesion is 40% stenosed.  Prox Cx lesion is 50% stenosed.  1st Diag lesion is 99% stenosed.  Prox LAD lesion is 50% stenosed.  Post intervention, there is a 20% residual stenosis.  Balloon angioplasty was performed using a BALLOON SAPPHIRE 2.0X12.  Patient Profile     85 y.o.malewith coronary artery disease status post PCI of RCA andOMwho presented with chest pain and was found to have an anterior lateral STEMI. He is now status post PCI of D1 due to99% mid lesion.  Assessment & Plan    #Acute lateral STEMI status post balloon angioplasty #  Coronary artery disease Brillinta was  discontinued yesterday as he only had PTCA. We will consider resuming brillinta or starting Plavix monotherapy today.  He was a bit frustrated yesterday and expressed desire to leave the hospital.  Per discussion with his wife and granddaughter, they were agreeable to consulting palliative medicine to assist with goals of care discussion and also not to proceed with any invasive intervention at this point.  Mr. Roshell really wants to go home. -Discontinue aspirin and Brilinta -Start Plavix -Appreciate assistance from palliative medicine  #HFrEF Echocardiogram revealed LVEF of 45-50%.  His LVEDP was 18 mmHg.  He does not appear grossly volume overloaded and he is maintaining his oxygen saturation.  Can possibly consider gentle diuresis if he becomes short of breath. -Continue Coreg  #Macrocytic anemia Hemoglobin this morning at 7.6 from 8.1 yesterday.  He still occult blood test has been negative x2.  Differential diagnosis includes anemia of chronic disease, vitamin B12 deficiency, folate deficiency, iron deficiency though it will be rare to have such an acute drop in hemoglobin if it was a vitamin deficiency.  We will continue to follow-up his CBC and await goals of care discussion per palliative medicine. -Follow-up iron, TIBC, ferritin -Transfuse if Hgb <7  #Non-anion gap metabolic acidosis #Acute on chronic kidney disease stage IV sCr stable at 2.3 (baseline serum creatinineis ~2-2.4.). -Continuep.o.sodium bicarb -Continue to monitor   #Hypertension -Continue p.o. hydralazine25mg  TID -IVHydralazine as needed prn -Continue Coreg 6.25 mg twice daily -Continue Imdur 60 mg daily  For questions or updates, please contact [facility] Please consult [email or web address] for contact info under Cardiology/STEMI.      Signed, Jean Rosenthal, MD  10/07/2019, 7:17 AM    I have examined the patient and reviewed assessment and plan and discussed with patient.  Agree with above as stated.     Patient feels well.  He expresses that he would like to go home and not go forward with cardiac catheterization.  Palliative care consult pending.  They will help decide on goals of care.  I think medical therapy for him is very reasonable given his age and multiple comorbidities.  No evidence of bleeding, but he is anemic.  We will start clopidogrel monotherapy.  Could consider transfusion as well depending on the goals of care.  It is encouraging that over the last couple of days, he has appeared more alert on exam.  He is more interactive.  He appears to be eating better.  Potential discharge if it is decided that he will go home with some type of assistance.  Larae Grooms

## 2019-10-07 NOTE — Progress Notes (Signed)
    Episode noted with PT this afternoon. Palliative consult pending. Hgb remains low today. Will transfuse 1 unit of PRBCs. Appears dyspneic at rest. Will give IV lasix 20mg  x1 post transfusion. Discussed the option of possible cath tomorrow, further discussion to follow via Dr. Irish Lack in the morning. Patient and wife agreeable to this plan. Placed back on the cath board tentatively, NPO at midnight.   SignedReino Bellis, NP-C 10/07/2019, 3:32 PM Pager: [phone]

## 2019-10-07 NOTE — Progress Notes (Signed)
While ambulating in the hall with physical therapy, patient had an episode of bradycardia into the 30s. Patient became presyncopal and was immediately assisted back to bed by RN and PT. Patient never lost consciousness. Upon returning to bed patient's HR returned to the 60s and patient stated he felt better.   Dr. Irish Lack updated about the above situation.

## 2019-10-07 NOTE — Progress Notes (Signed)
Physical Therapy Treatment Patient Details Name: Nicholas Porter MRN: [id number] DOB: Jan 03, 1930 Today's Date: 10/07/2019    History of Present Illness 85 y.o. male with known CAD s/p PCI of RCA, OM and presents with severe SSCP found to have anterolateral STEMI. He is s/p PCI of the of D1 for 99% mid lesion.  PMH - ckd, cad, CEA, copd, anxiety, arthritis,     PT Comments    Pt again became bradycardic to the low 30's with ambulation. At rest HR 67. Assisted pt to EOB. Pt sat EOB x 5 minutes with HR mid 60's. Amb 20' with walker with second person pushing recliner behind. At 27' pt sat and c/o SOB. HR initially 60 when pt sat down and then quickly dropped to 30's. Pt presyncopal. Nursing present. Pt reclined and returned to room via recliner and laterally slid over to bed. HR returned to 60's. Wife present. Pt's function obviously very limited by his intolerance to mobility. Will continue to follow.   Follow Up Recommendations  Home health PT;Supervision/Assistance - 24 hour;Other (comment)(vs home with hospice)     Equipment Recommendations  None recommended by PT    Recommendations for Other Services       Precautions / Restrictions Precautions Precautions: Fall Precaution Comments: watch HR Restrictions Weight Bearing Restrictions: No    Mobility  Bed Mobility Overal bed mobility: Needs Assistance Bed Mobility: Supine to Sit     Supine to sit: Min assist;HOB elevated     General bed mobility comments: Assist to elevate trunk into sitting  Transfers Overall transfer level: Needs assistance Equipment used: Rolling walker (2 wheeled) Transfers: Sit to/from Stand Sit to Stand: Min assist         General transfer comment: Assist for balance  Ambulation/Gait Ambulation/Gait assistance: Min assist;+2 safety/equipment Gait Distance (Feet): 20 Feet Assistive device: Rolling walker (2 wheeled) Gait Pattern/deviations: Step-through pattern;Decreased stride length;Narrow  base of support;Trunk flexed Gait velocity: decr Gait velocity interpretation: <1.8 ft/sec, indicate of risk for recurrent falls General Gait Details: Assist for balance and support. After amb 20' pt sat in chair that was following. Pt felt SOB and then HR dropped to 30's. Rolled recliner back to room and laterally scooted pt back to bed.    Stairs             Wheelchair Mobility    Modified Rankin (Stroke Patients Only)       Balance Overall balance assessment: Needs assistance Sitting-balance support: Feet supported Sitting balance-Leahy Scale: Fair     Standing balance support: Bilateral upper extremity supported Standing balance-Leahy Scale: Poor Standing balance comment: walker and min assist for static standing                            Cognition Arousal/Alertness: Awake/alert Behavior During Therapy: WFL for tasks assessed/performed Overall Cognitive Status: History of cognitive impairments - at baseline                                        Exercises      General Comments        Pertinent Vitals/Pain Pain Assessment: No/denies pain    Home Living                      Prior Function  PT Goals (current goals can now be found in the care plan section) Acute Rehab PT Goals Patient Stated Goal: to go home Progress towards PT goals: Not progressing toward goals - comment(bradycardia)    Frequency    Min 3X/week      PT Plan Current plan remains appropriate    Co-evaluation              AM-PAC PT "6 Clicks" Mobility   Outcome Measure  Help needed turning from your back to your side while in a flat bed without using bedrails?: None Help needed moving from lying on your back to sitting on the side of a flat bed without using bedrails?: A Little Help needed moving to and from a bed to a chair (including a wheelchair)?: A Little Help needed standing up from a chair using your arms (e.g.,  wheelchair or bedside chair)?: A Little Help needed to walk in hospital room?: A Little Help needed climbing 3-5 steps with a railing? : A Little 6 Click Score: 19    End of Session Equipment Utilized During Treatment: Gait belt Activity Tolerance: Treatment limited secondary to medical complications (Comment) Patient left: in bed;with family/visitor present;with nursing/sitter in room Nurse Communication: Other (comment)(nurse assisted with pt with bradycardia) PT Visit Diagnosis: Muscle weakness (generalized) (M62.81);History of falling (Z91.81)     Time: [id number] PT Time Calculation (min) (ACUTE ONLY): 20 min  Charges:  $Gait Training: 8-22 mins                     [place] Pager [phone] Office [place] 10/07/2019, 2:21 PM

## 2019-10-07 NOTE — Consult Note (Signed)
Consultation Note Date: 10/07/2019   Patient Name: Nicholas Porter  DOB: 05/29/1930  MRN: [id number]  Age / Sex: 85 y.o., male  PCP: Nicholas Peng, NP Referring Physician: Burnell Blanks,*  Reason for Consultation: Establishing goals of care  HPI/Patient Profile: 85 y.o. male  with past medical history of CAD, STEMI (2014), CKD 4, COPD, dementia, depression, HTN, and hypothyroidism admitted on 10/01/2019 with chest pain and found to have STEMI.  During hospitalization patient has been frustrated and expressed desires to leave the hospital and not continue aggressive care. PMT consulted for [name].   Clinical Assessment and Goals of Care: I have reviewed medical records including EPIC notes, labs and imaging, and received report from RN.  I then spoke with patient's wife to discuss diagnosis prognosis, GOC, EOL wishes, disposition and options.  I attempted to speak with patient as well - however, he was in and out of sleep, unable to participate in conversation fully.   I introduced Palliative Medicine as specialized medical care for people living with serious illness. It focuses on providing relief from the symptoms and stress of a serious illness. The goal is to improve quality of life for both the patient and the family.  As far as functional and nutritional status, wife tells me patient still had good function prior to current hospitalization. He mowed the front and back yard 2 days prior to admission. She tells me he ate well. We discussed that he had experienced a significant weight loss - from 150 to 88 pounds. She tells me Nicholas Porter is forgetful/easily confused and takes medication for dementia.   Patient's wife tells me that patient has been making statements indicating he is ready to pass away. She tells me she feels that he is tired and does not want to be alive any longer.    We discussed his current illness and what it means in  the larger context of his on-going co-morbidities.  Natural disease trajectory and expectations at EOL were discussed. Nicholas Porter tells me she understands the situation and she very much would like Nicholas Porter to have cardiac cath. We discussed that Nicholas Porter has shared with other providers and RN that he does not want to proceed with cath. Nicholas Porter tells me she does not feel he is able to make this decision d/t his dementia and she requests that we move forward with cath.   We discussed the concept of serving as surrogate decision maker and attempting to make decisions for patients they would likely make for themselves if they were capable. She tells me she believes Nicholas Porter would want cardiac cath if he fully understood the situation- she believes he is refusing it because he is frustrated with being in the hospital.   We discussed option of avoiding aggressive interventions and focusing on comfort, involving hospice. She is open to discussing this but ultimately states she would like to move forward with cath and see how patient does prior to making any further decisions.   Nicholas Porter ended the conversation somewhat abruptly telling me she was tired and would like to rest her eyes. She also tells me "I dont want to think about this anymore".   Primary Decision Maker NEXT OF KIN - wife    SUMMARY OF RECOMMENDATIONS   - patient has stated he wants to go home and does not want any further interventions, wife ultimately states she does not feel he has the capacity to make this decision d/t his baseline  dementia and she would like to move forward with cath - she feels he would want this as well if he could fully comprehend situation - hospice discussed, but she would first like to see how patient does following cath - conversation ended abruptly, wife tired - will attempt to follow up tomorrow  Code Status/Advance Care Planning:  DNR  Prognosis:   Unable to determine  Discharge Planning: To  Be Determined      Primary Diagnoses: Present on Admission: . STEMI (ST elevation myocardial infarction) ([place]) . ST elevation myocardial infarction (STEMI) of anterolateral wall, initial episode of care [facility])   I have reviewed the medical record, interviewed the patient and family, and examined the patient. The following aspects are pertinent.  Past Medical History:  Diagnosis Date  . Acute metabolic encephalopathy [id number]  . Acute renal failure superimposed on stage 4 chronic kidney disease ([place]) 11/27/2015  . Anemia of chronic renal failure, stage 4 (severe) ([place]) 11/27/2015  . Anxiety   . Arthritis    back  . 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 CAD S/P percutaneous coronary angioplasty - DES in RCA with PTCA for ISR; Moderate mLAD, 80% ostial OM2 stable 05/22/2008   Qualifier: Diagnosis of  By: Linda Hedges MD, Heinz Knuckles   . CKD (chronic kidney disease) stage 3, GFR 30-59 ml/min   . COPD (chronic obstructive pulmonary disease) ([place])   . Dementia ([place]) 05/13/2019  . Depression   . Dyspnea    with activity   . Essential hypertension 06/30/2007   Qualifier: Diagnosis of  By: Cori Razor RN, Mikal Plane Eye abnormalities    injections in eyes 12/2017  . H/O Inferior STEMI (09/2013) emergent PCI with Promus DES to RCA (3.0 mm  x 28 mm - 3.2 mm) 10/05/2013  . Hypothyroidism   . Klebsiella sepsis ([place]) 04/02/2017  . Left-sided carotid artery disease -- s/p CEA 04/06/2006   S/p Left CEA   . Mitral regurgitation - onexam 09/03/2016   Social History   Socioeconomic History  . Marital status: Married    Spouse name: Not on file  . Number of children: Not on file  . Years of education: Not on file  . Highest education level: Not on file  Occupational History    Employer: RETIRED    Comment: Retired  Scientific laboratory technician  . Financial resource strain: Not on file  . Food insecurity    Worry: Not on file     Inability: Not on file  . Transportation needs    Medical: Not on file    Non-medical: Not on file  Tobacco Use  . Smoking status: Former Smoker    Years: 15.00    Types: Cigarettes    Quit date: 12/23/1964    Years since quitting: 54.8  . Smokeless tobacco: Never Used  Substance and Sexual Activity  . Alcohol use: No  . Drug use: No  . Sexual activity: Not on file  Lifestyle  . Physical activity    Days per week: Not on file    Minutes per session: Not on file  . Stress: Not on file  Relationships  . Social Herbalist on phone: Not on file    Gets together: Not on file    Attends religious service: Not on file    Active member of club or organization: Not on file    Attends  meetings of clubs or organizations: Not on file    Relationship status: Not on file  Other Topics Concern  . Not on file  Social History Narrative   He is married with 2 children. He lives with his wife.   Very active at home, prior to a STEMI, he could walk several miles without discomfort. Prior to his MI, he would use his push mower to mow his entire lawn.   He is a former smoker and quit back in 1966. He does not drink alcohol      CODE STATUS: DO NOT RESUSCITATE   Family History  Problem Relation Age of Onset  . Heart disease Sister        Heart Transplant   . Diabetes Sister    Scheduled Meds: . carvedilol  6.25 mg Oral BID WC  . Chlorhexidine Gluconate Cloth  6 each Topical Daily  . clopidogrel  75 mg Oral Daily  . donepezil  10 mg Oral QHS  . feeding supplement (ENSURE ENLIVE)  237 mL Oral TID BM  . hydrALAZINE  25 mg Oral Q8H  . isosorbide mononitrate  60 mg Oral Daily  . latanoprost  1 drop Both Eyes QHS  . levothyroxine  25 mcg Oral QAC breakfast  . mouth rinse  15 mL Mouth Rinse BID  . rosuvastatin  5 mg Oral q1800  . sodium bicarbonate  650 mg Oral QID  . sodium chloride flush  3 mL Intravenous Q12H   Continuous Infusions: . sodium chloride    . sodium chloride 10  mL/hr at 10/06/19 1200  . sodium chloride Stopped (10/06/19 0836)   PRN Meds:.sodium chloride, acetaminophen, hydrALAZINE, influenza vaccine adjuvanted, morphine injection, nitroGLYCERIN, ondansetron (ZOFRAN) IV, sodium chloride flush Allergies  Allergen Reactions  . Amlodipine Besylate Hives  . Lipitor [Atorvastatin] Itching  . Naproxen Diarrhea  . Oxycodone-Acetaminophen Itching  . Isosorbide Rash   Vital Signs: BP 137/67 (BP Location: Left Arm)   Pulse 62   Temp 98.5 F (36.9 C) (Oral)   Resp (!) 31   Ht 5\' 7"  (1.702 m)   Wt 46 kg   SpO2 100%   BMI 15.88 kg/m  Pain Scale: 0-10 POSS *See Group Information*: S-Acceptable,Sleep, easy to arouse Pain Score: 0-No pain   SpO2: SpO2: 100 % O2 Device:SpO2: 100 % O2 Flow Rate: .O2 Flow Rate (L/min): 2 L/min  IO: Intake/output summary:   Intake/Output Summary (Last 24 hours) at 10/07/2019 1504 Last data filed at 10/07/2019 0800 Gross per 24 hour  Intake 240 ml  Output 650 ml  Net -410 ml    LBM: Last BM Date: 10/02/19 Baseline Weight: Weight: 40.4 kg Most recent weight: Weight: 46 kg     Palliative Assessment/Data: PPS 40%    The above conversation was completed via telephone due to the visitor restrictions during the COVID-19 pandemic. Thorough chart review and discussion with necessary members of the care team was completed as part of assessment. All issues were discussed and addressed but no physical exam was performed.  Time Total: 70 minutes Greater than 50%  of this time was spent counseling and coordinating care related to the above assessment and plan.  Juel Burrow, DNP, AGNP-C Palliative Medicine Team [phone] Pager: [phone]

## 2019-10-08 ENCOUNTER — Encounter (HOSPITAL_COMMUNITY)
Admission: EM | Disposition: A | Discharge status: Home Health | Payer: Self-pay | Source: Home / Self Care | Attending: Cardiovascular Disease

## 2019-10-08 DIAGNOSIS — D638 Anemia in other chronic diseases classified elsewhere: Secondary | ICD-10-CM

## 2019-10-08 DIAGNOSIS — Z515 Encounter for palliative care: Secondary | ICD-10-CM

## 2019-10-08 HISTORY — PX: CORONARY STENT INTERVENTION: CATH118234

## 2019-10-08 LAB — TYPE AND SCREEN
ABO/RH(D): A POS
Antibody Screen: NEGATIVE
Unit division: 0

## 2019-10-08 LAB — BPAM RBC
Blood Product Expiration Date: 202011052359
ISSUE DATE / TIME: 202010152202
Unit Type and Rh: 6200

## 2019-10-08 LAB — CBC
HCT: 30.1 % — ABNORMAL LOW (ref 39.0–52.0)
Hemoglobin: 10 g/dL — ABNORMAL LOW (ref 13.0–17.0)
MCH: 33.3 pg (ref 26.0–34.0)
MCHC: 33.2 g/dL (ref 30.0–36.0)
MCV: 100.3 fL — ABNORMAL HIGH (ref 80.0–100.0)
Platelets: 97 10*3/uL — ABNORMAL LOW (ref 150–400)
RBC: 3 MIL/uL — ABNORMAL LOW (ref 4.22–5.81)
RDW: 15.6 % — ABNORMAL HIGH (ref 11.5–15.5)
WBC: 6.8 10*3/uL (ref 4.0–10.5)
nRBC: 0 % (ref 0.0–0.2)

## 2019-10-08 LAB — BASIC METABOLIC PANEL
Anion gap: 8 (ref 5–15)
BUN: 63 mg/dL — ABNORMAL HIGH (ref 8–23)
CO2: 21 mmol/L — ABNORMAL LOW (ref 22–32)
Calcium: 8.4 mg/dL — ABNORMAL LOW (ref 8.9–10.3)
Chloride: 115 mmol/L — ABNORMAL HIGH (ref 98–111)
Creatinine, Ser: 2.34 mg/dL — ABNORMAL HIGH (ref 0.61–1.24)
GFR calc Af Amer: 28 mL/min — ABNORMAL LOW (ref >60)
GFR calc non Af Amer: 24 mL/min — ABNORMAL LOW (ref >60)
Glucose, Bld: 112 mg/dL — ABNORMAL HIGH (ref 70–99)
Potassium: 3.9 mmol/L (ref 3.5–5.1)
Sodium: 144 mmol/L (ref 135–145)

## 2019-10-08 LAB — POCT ACTIVATED CLOTTING TIME: Activated Clotting Time: 246 seconds

## 2019-10-08 LAB — HEMOGLOBIN AND HEMATOCRIT, BLOOD
HCT: 29.6 % — ABNORMAL LOW (ref 39.0–52.0)
Hemoglobin: 10.4 g/dL — ABNORMAL LOW (ref 13.0–17.0)

## 2019-10-08 LAB — VITAMIN B12: Vitamin B-12: 1375 pg/mL — ABNORMAL HIGH (ref 180–914)

## 2019-10-08 SURGERY — CORONARY STENT INTERVENTION
Anesthesia: LOCAL

## 2019-10-08 MED ORDER — HEPARIN (PORCINE) IN NACL 1000-0.9 UT/500ML-% IV SOLN
INTRAVENOUS | Status: AC
  Filled 2019-10-08: qty 1000

## 2019-10-08 MED ORDER — SODIUM CHLORIDE 0.9 % IV SOLN: 250.0000 mL | INTRAVENOUS | Status: DC | PRN

## 2019-10-08 MED ORDER — LIDOCAINE HCL (PF) 1 % IJ SOLN
INTRAMUSCULAR | Status: DC | PRN
  Administered 2019-10-08: 2 mL

## 2019-10-08 MED ORDER — SODIUM CHLORIDE 0.9 % IV SOLN
INTRAVENOUS | Status: DC
  Administered 2019-10-08: 12:00:00 via INTRAVENOUS

## 2019-10-08 MED ORDER — LIDOCAINE HCL (PF) 1 % IJ SOLN
INTRAMUSCULAR | Status: AC
  Filled 2019-10-08: qty 30

## 2019-10-08 MED ORDER — VERAPAMIL HCL 2.5 MG/ML IV SOLN
INTRAVENOUS | Status: AC
  Filled 2019-10-08: qty 2

## 2019-10-08 MED ORDER — CARVEDILOL 6.25 MG PO TABS: 6.2500 mg | ORAL_TABLET | Freq: Two times a day (BID) | ORAL | 11 refills | Status: DC

## 2019-10-08 MED ORDER — HEPARIN SODIUM (PORCINE) 1000 UNIT/ML IJ SOLN
INTRAMUSCULAR | Status: AC
  Filled 2019-10-08: qty 1

## 2019-10-08 MED ORDER — ALBUTEROL SULFATE (2.5 MG/3ML) 0.083% IN NEBU
2.5000 mg | INHALATION_SOLUTION | Freq: Once | RESPIRATORY_TRACT | Status: AC
  Administered 2019-10-08: 2.5 mg via RESPIRATORY_TRACT
  Filled 2019-10-08: qty 3

## 2019-10-08 MED ORDER — NITROGLYCERIN 1 MG/10 ML FOR IR/CATH LAB
INTRA_ARTERIAL | Status: DC | PRN
  Administered 2019-10-08: 100 ug

## 2019-10-08 MED ORDER — ROSUVASTATIN CALCIUM 5 MG PO TABS: 5.0000 mg | ORAL_TABLET | Freq: Every day | ORAL | 11 refills | Status: DC

## 2019-10-08 MED ORDER — ENSURE ENLIVE PO LIQD: 237.0000 mL | Freq: Three times a day (TID) | ORAL | 12 refills | Status: DC

## 2019-10-08 MED ORDER — ASPIRIN 81 MG PO CHEW
81.0000 mg | CHEWABLE_TABLET | Freq: Once | ORAL | Status: AC
  Administered 2019-10-08: 81 mg via ORAL
  Filled 2019-10-08: qty 1

## 2019-10-08 MED ORDER — SODIUM CHLORIDE 0.9 % IV SOLN
INTRAVENOUS | Status: DC
  Administered 2019-10-08: 07:00:00 via INTRAVENOUS

## 2019-10-08 MED ORDER — CLOPIDOGREL BISULFATE 75 MG PO TABS: 75.0000 mg | ORAL_TABLET | Freq: Every day | ORAL | 11 refills | Status: DC

## 2019-10-08 MED ORDER — IOHEXOL 350 MG/ML SOLN
INTRAVENOUS | Status: DC | PRN
  Administered 2019-10-08: 25 mL

## 2019-10-08 MED ORDER — VERAPAMIL HCL 2.5 MG/ML IV SOLN
INTRAVENOUS | Status: DC | PRN
  Administered 2019-10-08: 10 mL via INTRA_ARTERIAL

## 2019-10-08 MED ORDER — HEPARIN SODIUM (PORCINE) 1000 UNIT/ML IJ SOLN
INTRAMUSCULAR | Status: DC | PRN
  Administered 2019-10-08: 5000 [IU] via INTRAVENOUS

## 2019-10-08 MED ORDER — HEPARIN (PORCINE) IN NACL 1000-0.9 UT/500ML-% IV SOLN
INTRAVENOUS | Status: DC | PRN
  Administered 2019-10-08 (×2): 500 mL

## 2019-10-08 MED ORDER — NITROGLYCERIN 1 MG/10 ML FOR IR/CATH LAB
INTRA_ARTERIAL | Status: AC
  Filled 2019-10-08: qty 10

## 2019-10-08 MED ORDER — SODIUM CHLORIDE 0.9% FLUSH
3.0000 mL | Freq: Two times a day (BID) | INTRAVENOUS | Status: DC
  Administered 2019-10-08: 3 mL via INTRAVENOUS

## 2019-10-08 MED ORDER — SODIUM CHLORIDE 0.9% FLUSH: 3.0000 mL | INTRAVENOUS | Status: DC | PRN

## 2019-10-08 MED ORDER — HYDRALAZINE HCL 25 MG PO TABS: 25.0000 mg | ORAL_TABLET | Freq: Three times a day (TID) | ORAL | 0 refills | Status: DC

## 2019-10-08 MED ORDER — CLOPIDOGREL BISULFATE 75 MG PO TABS
300.0000 mg | ORAL_TABLET | Freq: Once | ORAL | Status: AC
  Administered 2019-10-08: 300 mg via ORAL
  Filled 2019-10-08: qty 4

## 2019-10-08 MED FILL — CARVEDILOL 6.25 MG TABLET: 6.25 | 30 days supply | Qty: 60 | Fill #0

## 2019-10-08 MED FILL — ROSUVASTATIN CALCIUM 5 MG T: 5 | 30 days supply | Qty: 30 | Fill #0

## 2019-10-08 MED FILL — CLOPIDOGREL 75 MG TABLET: 75 | 30 days supply | Qty: 30 | Fill #0

## 2019-10-08 MED FILL — hydrALAZINE HCL 25 MG TABS: 25 | 30 days supply | Qty: 90 | Fill #0

## 2019-10-08 SURGICAL SUPPLY — 13 items
CATH LAUNCHER 6FR JR4 (CATHETERS) ×1 IMPLANT
DEVICE RAD COMP TR BAND LRG (VASCULAR PRODUCTS) IMPLANT
DEVICE RAD TR BAND REGULAR (VASCULAR PRODUCTS) ×1 IMPLANT
GLIDESHEATH SLEND SS 6F .021 (SHEATH) ×1 IMPLANT
GUIDEWIRE INQWIRE 1.5J.035X260 (WIRE) IMPLANT
INQWIRE 1.5J .035X260CM (WIRE) ×2
KIT ENCORE 26 ADVANTAGE (KITS) ×1 IMPLANT
KIT HEART LEFT (KITS) ×2 IMPLANT
PACK CARDIAC CATHETERIZATION (CUSTOM PROCEDURE TRAY) ×2 IMPLANT
STENT RESOLUTE ONYX 3.0X18 (Permanent Stent) ×1 IMPLANT
TRANSDUCER W/STOPCOCK (MISCELLANEOUS) ×2 IMPLANT
TUBING CIL FLEX 10 FLL-RA (TUBING) ×2 IMPLANT
WIRE RUNTHROUGH .014X180CM (WIRE) ×1 IMPLANT

## 2019-10-08 NOTE — TOC Initial Note (Addendum)
Transition of Care [facility]) - Initial/Assessment Note    Patient Details  Name: Nicholas Porter MRN: [id number] Date of Birth: Jan 30, 1930  Transition of Care [facility]) CM/SW Contact:    Bethena Roys, RN Phone Number: 10/08/2019, 2:46 PM  Clinical Narrative:  Pt presented for Stemi- post cath- CM did discuss with patient regarding [place] vs Hospice Services. Family wants to go home with [facility]. Medicare.gov list provided to patient and they chose [facility]. Referral provided to [facility] and [facility] to begin within 24-48 hours post transition home. Patient does not need DME at this time. Pt has PCP and gets medications appropriately. Wife and daughter to provide transportation home. Pt will need new orders for [place] and  F2F. No further needs from CM at this time.               1529- Referral received for outpatient palliative services. CM did discuss with family- agreeable to services- Referral made to [facility]. Patient will receive a call to the home. No further needs identified at this time.    Expected Discharge Plan: [place].) Barriers to Discharge: No Barriers Identified   Patient Goals and CMS Choice Patient states their goals for this hospitalization and ongoing recovery are:: "to return home" CMS Medicare.gov Compare Post Acute Care list provided to:: Patient Choice offered to / list presented to : Patient, Spouse  Expected Discharge Plan and Services Expected Discharge Plan: [place].) In-house Referral: Hospice / Palliative Care Discharge Planning Services: CM Consult Post Acute Care Choice: [place] arrangements for the past 2 months: Single Family Home                  HH Arranged: RN, Disease Management, PT, Nurse's Aide HH Agency: [place] [facility] Agency Contacted: 10/08/19 Time [place]: 1444 Representative spoke with at [place]:  [name]  Prior Living Arrangements/Services Living arrangements for the past 2 months: [place] with:: Spouse Patient language and need for interpreter reviewed:: Yes Do you feel safe going back to the place where you live?: Yes      Need for Family Participation in Patient Care: Yes (Comment) Care giver support system in place?: Yes (comment)   Criminal Activity/Legal Involvement Pertinent to Current Situation/Hospitalization: No - Comment as needed  Activities of Daily Living Home Assistive Devices/Equipment: None ADL Screening (condition at time of admission) Patient's cognitive ability adequate to safely complete daily activities?: No Is the patient deaf or have difficulty hearing?: No Does the patient have difficulty seeing, even when wearing glasses/contacts?: No Does the patient have difficulty concentrating, remembering, or making decisions?: No Patient able to express need for assistance with ADLs?: No Does the patient have difficulty dressing or bathing?: No Independently performs ADLs?: Yes (appropriate for developmental age) Does the patient have difficulty walking or climbing stairs?: No Weakness of Legs: None Weakness of Arms/Hands: None  Permission Sought/Granted Permission sought to share information with : Family Supports, Chartered certified accountant granted to share information with : Yes, Verbal Permission Granted     Permission granted to share info w AGENCY: [facility]        Emotional Assessment Appearance:: Appears stated age Attitude/Demeanor/Rapport: Engaged Affect (typically observed): Accepting Orientation: : Oriented to Self, Oriented to Place, Oriented to  Time, Oriented to Situation Alcohol / Substance Use: Not Applicable Psych Involvement: No (comment)  Admission diagnosis:  Acute ST elevation  myocardial infarction (STEMI) of lateral wall ([place]) [I21.29] Patient Active Problem List   Diagnosis Date Noted  . Goals  of care, counseling/discussion   . Palliative care by specialist   . Acute ST elevation myocardial infarction (STEMI) of lateral wall ([place])   . STEMI (ST elevation myocardial infarction) ([place]) 10/01/2019  . ST elevation myocardial infarction (STEMI) of anterolateral wall, initial episode of care ([place]) 10/01/2019  . Failure to thrive in adult 07/20/2019  . Dementia ([place]) 05/13/2019  . Moderate aortic stenosis by prior echocardiogram 01/28/2019  . Epistaxis, recurrent 01/28/2019  . Delayed cystic duct stump bile leak s/p ERCP/stent 01/06/2019 01/07/2019  . Protein-calorie malnutrition, severe 01/04/2019  . CKD (chronic kidney disease), stage III 01/03/2019  . Pill dysphagia 01/02/2019  . Acute gangrenous cholecystitis s/p lap cholecystectomy 01/02/2019 01/01/2019  . CKD (chronic kidney disease) stage 4, GFR 15-29 ml/min (HCC) 01/01/2019  . Unintentional weight loss 01/01/2019  . History of medication noncompliance 01/01/2019  . History of partial gastrectomy 01/01/2019  . Hyperlipidemia with target LDL less than 70 10/23/2018  . Psychiatric disturbance 04/15/2017  . Orthostatic hypotension 04/08/2017  . Acute kidney injury superimposed on chronic kidney disease ([place]) 04/02/2017  . Benign prostatic hyperplasia 03/11/2017  . Weakness   . Mitral regurgitation - on exam 09/03/2016  . Anemia of chronic renal failure, stage 4 (severe) ([place]) 11/27/2015  . GERD (gastroesophageal reflux disease) 01/24/2015  . Thrombocytopenia ([place]) 08/09/2014  . H/O Inferior STEMI (09/2013) emergent PCI with Promus DES to RCA (3.0 mm  x 28 mm - 3.2 mm) 10/05/2013  . 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 CAD S/P percutaneous coronary angioplasty - DES in RCA with PTCA for ISR; Moderate mLAD, 80% ostial OM2 stable 05/22/2008  . Essential hypertension 06/30/2007  . Left-sided carotid artery disease -- s/p CEA 04/06/2006    Class: History of   PCP:  Dorothyann Peng, NP Pharmacy:   [facility] [place], [address] AT [address] [place] [address] Phone: [phone] Fax: [phone]  [facility] [place] [place] [address] [place] [address] Phone: [phone] Fax: [phone]     Social Determinants of Health (SDOH) Interventions    Readmission Risk Interventions Readmission Risk Prevention Plan 10/08/2019  Transportation Screening Complete  HRI or Home Care Consult Complete  Social Work Consult for [place] Planning/Counseling Complete  Palliative Care Screening Complete  Medication Review Press photographer) Complete  Some recent data might be hidden

## 2019-10-08 NOTE — Progress Notes (Signed)
RN contacted Barrett, PA concerning asprin and plavix pre-cath. Verbal order to administer both plavix and aspirin this am before cath. Will continue to monitor.

## 2019-10-08 NOTE — Progress Notes (Addendum)
Progress Note  Patient Name: Nicholas Porter Date of Encounter: 10/08/2019  Primary Cardiologist: Glenetta Hew, MD   Subjective   He was examined with his wife at bedside and reports he is doing well.  He denies chest pain, shortness of breath.  Inpatient Medications    Scheduled Meds: . sodium chloride   Intravenous Once  . carvedilol  6.25 mg Oral BID WC  . Chlorhexidine Gluconate Cloth  6 each Topical Daily  . clopidogrel  75 mg Oral Daily  . donepezil  10 mg Oral QHS  . feeding supplement (ENSURE ENLIVE)  237 mL Oral TID BM  . hydrALAZINE  25 mg Oral Q8H  . isosorbide mononitrate  60 mg Oral Daily  . latanoprost  1 drop Both Eyes QHS  . levothyroxine  25 mcg Oral QAC breakfast  . mouth rinse  15 mL Mouth Rinse BID  . rosuvastatin  5 mg Oral q1800  . sodium bicarbonate  650 mg Oral QID  . sodium chloride flush  3 mL Intravenous Q12H   Continuous Infusions: . sodium chloride    . sodium chloride 10 mL/hr at 10/06/19 1200  . sodium chloride    . sodium chloride Stopped (10/06/19 0836)   PRN Meds: sodium chloride, acetaminophen, hydrALAZINE, influenza vaccine adjuvanted, morphine injection, nitroGLYCERIN, ondansetron (ZOFRAN) IV, sodium chloride flush   Vital Signs    Vitals:   10/07/19 2200 10/07/19 2236 10/08/19 0141 10/08/19 0400  BP: (!) 150/75 (!) 159/69 (!) 187/73 (!) 163/66  Pulse:    62  Resp: (!) 22   (!) 22  Temp: 98.3 F (36.8 C) 98.4 F (36.9 C) 98.1 F (36.7 C) 98.1 F (36.7 C)  TempSrc: Oral Oral Oral Oral  SpO2:    99%  Weight:    50.6 kg  Height:        Intake/Output Summary (Last 24 hours) at 10/08/2019 0708 Last data filed at 10/08/2019 0141 Gross per 24 hour  Intake 1107 ml  Output 350 ml  Net 757 ml   Filed Weights   10/05/19 0600 10/07/19 0652 10/08/19 0400  Weight: 47.2 kg 46 kg 50.6 kg    Telemetry    Unremarkable- Personally Reviewed  ECG    None today- Personally Reviewed  Physical Exam   GEN: No acute  distress.   Cardiac: RRR, no murmurs, rubs, or gallops.  Respiratory:  Tachypneic, wheezing on lung auscultation MS: No edema; No deformity.  Labs    Chemistry Recent Labs  Lab 10/06/19 0458 10/07/19 0339 10/08/19 0442  NA 145 143 144  K 4.2 3.7 3.9  CL 120* 117* 115*  CO2 16* 17* 21*  GLUCOSE 116* 97 112*  BUN 70* 66* 63*  CREATININE 2.53* 2.32* 2.34*  CALCIUM 8.4* 8.1* 8.4*  GFRNONAA 22* 24* 24*  GFRAA 25* 28* 28*  ANIONGAP 9 9 8      Hematology Recent Labs  Lab 10/06/19 0952 10/07/19 0339 10/08/19 0301 10/08/19 0442  WBC 6.3 5.7  --  6.8  RBC 2.40* 2.19*  --  3.00*  HGB 8.1* 7.6* 10.4* 10.0*  HCT 25.1* 22.8* 29.6* 30.1*  MCV 104.6* 104.1*  --  100.3*  MCH 33.8 34.7*  --  33.3  MCHC 32.3 33.3  --  33.2  RDW 14.7 14.6  --  15.6*  PLT 88* 82*  --  97*    Cardiac EnzymesNo results for input(s): TROPONINI in the last 168 hours. No results for input(s): TROPIPOC in the last 168 hours.  BNPNo results for input(s): BNP, PROBNP in the last 168 hours.   DDimer No results for input(s): DDIMER in the last 168 hours.   Radiology    No results found.  Cardiac Studies   10/01/2019-echocardiogram  1. Left ventricular ejection fraction, by visual estimation, is 45 to 50%. The left ventricle has mildly decreased function. Normal left ventricular size. There is no left ventricular hypertrophy. 2. Apical septal segment and apex are abnormal. 3. Left ventricular diastolic Doppler parameters are consistent with impaired relaxation pattern of LV diastolic filling. 4. Global right ventricle has normal systolic function.The right ventricular size is normal. No increase in right ventricular wall thickness. 5. Left atrial size was normal. 6. Right atrial size was normal. 7. Moderate mitral annular calcification. 8. The mitral valve is abnormal. Trace mitral valve regurgitation. Mild mitral stenosis. 9. The tricuspid valve is normal in structure. Tricuspid valve  regurgitation was not visualized by color flow Doppler. 10. Aortic valve mean gradient measures 19.3 mmHg. 11. Aortic valve peak gradient measures 33.8 mmHg. 12. The aortic valve is bicuspid Aortic valve regurgitation is mild by color flow Doppler. Moderate aortic valve stenosis. 13. There is Moderate thickening of the aortic valve. 14. There is Severe calcifcation of the aortic valve. 15. The pulmonic valve was normal in structure. Pulmonic valve regurgitation is not visualized by color flow Doppler. 16. Normal pulmonary artery systolic pressure. 17. The inferior vena cava is normal in size with greater than 50% respiratory variability, suggesting right atrial pressure of 3 mmHg.   10/01/2019-left heart cath   Prox RCA to Mid RCA lesion is 20% stenosed.  Mid RCA lesion is 95% stenosed.  Mid RCA to Dist RCA lesion is 40% stenosed.  Prox Cx lesion is 50% stenosed.  1st Diag lesion is 99% stenosed.  Prox LAD lesion is 50% stenosed.  Post intervention, there is a 20% residual stenosis.  Balloon angioplasty was performed using a BALLOON SAPPHIRE 2.0X12.  Patient Profile     85 y.o.malewith coronary artery disease status post PCI of RCA andOMwho presented with chest pain and was found to have an anterior lateral STEMI. He is now status post PCI of D1 due to99% mid lesion.  Assessment & Plan    #Acute lateral STEMI status post balloon angioplasty #Coronary artery disease It was a family discussion yesterday with the patient and his wife.  Palliative medicine has assisted [place] with goals of care discussion.  The wife concluded that patient did not have capacity to make decision regarding not moving forward with coronary angiography.  At the end of the conversation, the decision was made to proceed with cholangiography and stent placement -Discontinue aspirin and Brilinta -Start Plavix -Appreciate assistance from palliative medicine -Tentatively planned for PCI today   #HFrEF Echocardiogram revealed LVEF of 45-50%.  His LVEDP was 18 mmHg.  He received a unit of PRBC yesterday and towards was noted to be mildly dyspneic and received a dose of IV Lasix.  -Continue Coreg  #Wheezing On physical exam this morning, there was noticeable wheezing on lung auscultation.  In addition, he was tachypneic with RR in the 20s though he denied shortness of breath.  He also does not report of any history of asthma or COPD.  He did undergo PRBC transfusion yesterday however has remained afebrile. -Nebulizer treatment with albuterol -Continue to monitor  #Macrocytic anemia He received 1 unit PRBC yesterday and hemoglobin has improved this morning to 10.  His iron studies reveals low iron, low ferritin and  low TIBC very much consistent with anemia of chronic disease.  His ferritin was at 100 making iron deficiency anemia very less likely. -Transfuse if Hgb <7  #Non-anion gap metabolic acidosis #Acute on chronic kidney disease stage IV sCr stable at 2.3 (baseline serum creatinineis ~2-2.4.). -Continuep.o.sodium bicarb -Continue to monitor  #Hypertension -Continuep.o. hydralazine25mg  TID -IVHydralazine as needed prn -Continue Coreg 6.25 mg twice daily -Continue Imdur 60 mg daily  #Severe protein calorie malnutrition He has significant malnutrition and would benefit from nutrition supplements such as Ensure's.  It is reassuring that he has gained some weight during this admission.  For questions or updates, please contact [place] Please consult [email or web address] for contact info under Cardiology/STEMI.      Signed, Jean Rosenthal, MD  10/08/2019, 7:08 AM    I have examined the patient and reviewed assessment and plan and discussed with patient.  Agree with above as stated.    Hbg improved with 1 U PRBCs.  Had long discussion with the family.  The patient has had several episodes where he becomes bradycardic and hypotensive with even minimal activity  while working with physical therapy.  Hopefully, revascularization of this significant RCA lesion would help.  The lesion appears focal.  There is an old stent that will act as a marker.  Hopefully, this will help limit contrast dye.  I do not think there will be a significant change in his renal function if minimal contrast is used.  His big concern is that he wants to go home today.  I think it would still be possible to send him home later this evening as long as the case was done from the right radial approach and minimal contrast was used.  We spoke at length about diet as well.  He needs to try to eat more.  I have told him he can basically eat what he wants given his age and we are considering palliative care.  I encouraged him to drink Ensure as well to help take in some protein.  All questions about cath were answered.  Please check LVEDP at the time of cath.  This will help with his fluid management.  He is able to lie flat without any difficulty.  Larae Grooms

## 2019-10-08 NOTE — Discharge Summary (Addendum)
Discharge Summary    Patient ID: Ovel Guba MRN: [id number]; DOB: 1930/02/28  Admit date: 10/01/2019 Discharge date: 10/08/2019  Primary Care Provider: Dorothyann Peng, NP  Primary Cardiologist: Glenetta Hew, MD  Primary Electrophysiologist:  None   Discharge Diagnoses    Principal Problem:   ST elevation myocardial infarction (STEMI) of anterolateral wall, initial episode of care [facility]) Active Problems:   Acute kidney injury superimposed on chronic kidney disease ([place])   STEMI (ST elevation myocardial infarction) ([place])   Acute ST elevation myocardial infarction (STEMI) of lateral wall ([place])   Goals of care, counseling/discussion   Palliative care by specialist   Allergies Allergies  Allergen Reactions  . Amlodipine Besylate Hives  . Lipitor [Atorvastatin] Itching  . Naproxen Diarrhea  . Oxycodone-Acetaminophen Itching  . Isosorbide Rash    Diagnostic Studies/Procedures    CORONARY STENT INTERVENTION: 10/08/2019  Prox RCA to Mid RCA lesion is 20% stenosed.  Mid RCA lesion is 95% stenosed.  Mid RCA to Dist RCA lesion is 40% stenosed.  Post intervention, there is a 0% residual stenosis.  A drug-eluting stent was successfully placed using a STENT RESOLUTE ONYX 3.0X18.   1.  Moderately elevated left ventricular end-diastolic pressure at 22 mmHg. 2.  Successful direct stenting of the mid right coronary artery.  The stent overlapped with the previously placed stent.  Ostial RCA spasm improved with intracoronary nitroglycerin.  Recommendations: Continue treatment with Plavix without aspirin given recent bleeding issues. Only 25 mL of contrast was used. We will gently hydrate for few hours. Intervention     CARDIAC CATH: 10/01/2019  Prox RCA to Mid RCA lesion is 20% stenosed.  Mid RCA lesion is 95% stenosed.  Mid RCA to Dist RCA lesion is 40% stenosed.  Prox Cx lesion is 50% stenosed.  1st Diag lesion is 99% stenosed.  Prox LAD lesion is 50%  stenosed.  Post intervention, there is a 20% residual stenosis.  Balloon angioplasty was performed using a BALLOON SAPPHIRE 2.0X12.   1. Acute anterolateral STEMI secondary to thrombotic occlusion of the small to moderate caliber first Diagonal branch 2. Successful PTCA with balloon angioplasty of the Diagonal branch. No stent was placed given the relatively small size of the vessel. Flow was established down the Diagonal with balloon angioplasty only. 3. Moderate mid LAD stenosis at the takeoff of the Diagonal branch which does not appear to be flow limiting 4. Moderate mid proximal to mid Circumflex stenosis which does not appear to be flow limiting.  5. The RCA is a large dominant vessel with a patent mid stented segment with mild stent restenosis. There is a severe stenosis in the mid RCA just beyond the old stented segment.   Recommendations: Will reload Brilinta 180 mg po x 1 in the ICU as the patient had an episode of emesis prior to leaving the cath lab. Will continue Aggrastat drip for 18 hours given thrombotic burden. Will continue ASA/Brilinta for 12 months. Consider starting a beta blocker later today pending his BP. He has apparently had intolerance to statins in the past (itching noted in record). Will explore this and consider another statin prior to discharge. I would anticipate that we will gently hydrate over the next 24 hours and if renal function remains stable, will plan staged PCI of the RCA next week.  Diagnostic Dominance: Right   Intervention     ECHO: 10/01/2019  1. Left ventricular ejection fraction, by visual estimation, is 45 to 50%. The left ventricle has mildly decreased function.  Normal left ventricular size. There is no left ventricular hypertrophy.  2. Apical septal segment and apex are abnormal.  3. Left ventricular diastolic Doppler parameters are consistent with impaired relaxation pattern of LV diastolic filling.  4. Global right ventricle has normal  systolic function.The right ventricular size is normal. No increase in right ventricular wall thickness.  5. Left atrial size was normal.  6. Right atrial size was normal.  7. Moderate mitral annular calcification.  8. The mitral valve is abnormal. Trace mitral valve regurgitation. Mild mitral stenosis.  9. The tricuspid valve is normal in structure. Tricuspid valve regurgitation was not visualized by color flow Doppler. 10. Aortic valve mean gradient measures 19.3 mmHg. 11. Aortic valve peak gradient measures 33.8 mmHg. 12. The aortic valve is bicuspid Aortic valve regurgitation is mild by color flow Doppler. Moderate aortic valve stenosis. 13. There is Moderate thickening of the aortic valve. 14. There is Severe calcifcation of the aortic valve. 15. The pulmonic valve was normal in structure. Pulmonic valve regurgitation is not visualized by color flow Doppler. 16. Normal pulmonary artery systolic pressure. 17. The inferior vena cava is normal in size with greater than 50% respiratory variability, suggesting right atrial pressure of 3 mmHg.  FINDINGS  Left Ventricle: Left ventricular ejection fraction, by visual estimation, is 45 to 50%. The left ventricle has mildly decreased function. There is no left ventricular hypertrophy. Normal left ventricular size. Spectral Doppler shows Left ventricular  diastolic Doppler parameters are consistent with impaired relaxation pattern of LV diastolic filling.  _____________   History of Present Illness     Bodyn Robuck is a 85 y.o. male with hx coronary artery disease, status post PCI of RCA andOMwho presented 10/09 with chest pain and was found to have an anterior lateral STEMI.   Hospital Course     Consultants: Palliative Care  He was taken to the Cath Lab urgently on 10/9.  Cardiac catheterization results are above.  He had PTCA to the first diagonal, but it was noted that he would need PCI to the RCA.  He was placed on high-dose  statin, and beta-blocker.  He had Imdur added to his medication regimen.  Because of his chronic kidney disease, no ACE or ARB was used.  His renal function was monitored closely during his hospital stay.  His renal function is poor but stable.  He will need an early recheck as an outpatient.  His lactate was elevated at 3.2.  It is suspected that this is related to chronic kidney disease.  He required sodium bicarbonate. He will not be on the serum bicarbonate at discharge, but this will need to be followed as an outpatient.  An echocardiogram was performed and his EF was 45-50%.  As stated previously, he is on a beta-blocker and nitrates, but no ACE or ARB due to poor renal function.  His blood pressure was significantly elevated, as high as 212/90.  His Imdur was uptitrated and hydralazine was added for better blood pressure control.  He was weak and seemed to have some deconditioning.  He was seen by PT and it was recommended that he have home health PT as well as 24-hour supervision and assistance at home, by family members.  His hemoglobin and hematocrit were monitored during his stay.  He had a macrocytic anemia, his B12 level and his iron levels were low as well.  Brilinta was held for a time because he only had PTCA.  It was felt that if he needed  stenting, Plavix monotherapy can be considered.  His hemoglobin dropped to 7.6, FOB's were negative x2.  Because of his MI, it was felt that he should be transfused.  He got a unit of packed red cells and some IV Lasix after that.  His hemoglobin was 10 at discharge.  There was discussion with the patient, his wife, and his granddaughter about goals of care.  A palliative care consult was called.  He is a DNR, and initially wanted discharge.  However, his family was involved in the discussion and felt that because of his dementia, his capacity to make decisions was limited.  They elected to proceed with PCI.  On 10/16, he was seen by Dr. Irish Lack  and all data were reviewed.  It was decided not to send him home on bicarb, and let his PCP decide on B12 or iron supplementation.  He was taken to the Cath Lab and had a drug-eluting stent to his RCA, and tolerated the procedure well.  Post procedure, he was hydrated gently for few hours.  After that, no further inpatient work-up was indicated and he is considered stable for discharge, to follow-up as an outpatient.  Home health orders for home health RN, aide and physical therapist were entered.   Did the patient have an acute coronary syndrome (MI, NSTEMI, STEMI, etc) this admission?:  Yes                               AHA/ACC Clinical Performance & Quality Measures: 1. Aspirin prescribed? - No - anemia 2. ADP Receptor Inhibitor (Plavix/Clopidogrel, Brilinta/Ticagrelor or Effient/Prasugrel) prescribed (includes medically managed patients)? - Yes 3. Beta Blocker prescribed? - Yes 4. High Intensity Statin (Lipitor 40-80mg  or Crestor 20-40mg ) prescribed? - Yes 5. EF assessed during THIS hospitalization? - Yes 6. For EF <40%, was ACEI/ARB prescribed? - Not Applicable (EF >/= [id number]) 7. For EF <40%, Aldosterone Antagonist (Spironolactone or Eplerenone) prescribed? - Not Applicable (EF >/= [id number]) 8. Cardiac Rehab Phase II ordered (Included Medically managed Patients)? - No - Age and frailty   _____________  Discharge Vitals Blood pressure (!) 133/56, pulse (!) 57, temperature 98.1 F (36.7 C), temperature source Oral, resp. rate 20, height 5\' 7"  (1.702 m), weight 50.6 kg, SpO2 96 %.  Filed Weights   10/05/19 0600 10/07/19 0652 10/08/19 0400  Weight: 47.2 kg 46 kg 50.6 kg    Labs & Radiologic Studies    CBC Recent Labs    10/07/19 0339 10/08/19 0301 10/08/19 0442  WBC 5.7  --  6.8  HGB 7.6* 10.4* 10.0*  HCT 22.8* 29.6* 30.1*  MCV 104.1*  --  100.3*  PLT 82*  --  97*   Basic Metabolic Panel Recent Labs    10/07/19 0339 10/08/19 0442  NA 143 144  K 3.7 3.9  CL 117* 115*  CO2 17*  21*  GLUCOSE 97 112*  BUN 66* 63*  CREATININE 2.32* 2.34*  CALCIUM 8.1* 8.4*   Liver Function Tests Lab Results  Component Value Date   ALT 17 10/01/2019   AST 59 (H) 10/01/2019   ALKPHOS 91 10/01/2019   BILITOT 1.1 10/01/2019    High Sensitivity Troponin:   Recent Labs  Lab 10/01/19 0326 10/01/19 0702 10/01/19 1259 10/01/19 1601  TROPONINIHS 34* 7,701* >27,000* >27,000*    Fasting Lipid Panel Lab Results  Component Value Date   CHOL 147 10/01/2019   HDL 60 10/01/2019   LDLCALC 64  10/01/2019   TRIG 114 10/01/2019   CHOLHDL 2.5 10/01/2019    Thyroid Function Tests Lab Results  Component Value Date   TSH 5.576 (H) 10/01/2019   _____________  Dg Chest Portable 1 View  Result Date: 10/01/2019 CLINICAL DATA:  Chest pain EXAM: PORTABLE CHEST 1 VIEW COMPARISON:  01/01/2019 FINDINGS: Normal heart size and mediastinal contours. There is no edema, consolidation, effusion, or pneumothorax. Noted nipple shadows. Surgical clips at the GE junction. Generalized osteopenia IMPRESSION: No evidence of acute disease. Electronically Signed   By: Monte Fantasia M.D.   On: 10/01/2019 04:05   Disposition   Pt is being discharged home today in good condition.  Follow-up Plans & Appointments    Follow-up Information    [facility] Follow up.   Specialty: Home Health Services Why: Registered Nurse, Physical Therapy, Aide.  Contact information: [place] [address] [phone]        [facility].   Why: Care [facility]ctions: will follow up with a phone call once you get home within 1-2 business days.  Contact information: [address] [place] [address] [phone]        Lendon Colonel, NP Follow up on 10/18/2019.   Specialties: Nurse Practitioner, Radiology, Cardiology Why: Please arrive at 11:00 AM for an 11:15 AM appointment. Contact information: [address] [place]  [address] [phone]          Discharge Instructions    Diet - low sodium heart healthy   Complete by: As directed    Increase activity slowly   Complete by: As directed       Discharge Medications   Allergies as of 10/08/2019      Reactions   Amlodipine Besylate Hives   Lipitor [atorvastatin] Itching   Naproxen Diarrhea   Oxycodone-acetaminophen Itching   Isosorbide Rash      Medication List    TAKE these medications   carvedilol 6.25 MG tablet Commonly known as: COREG Take 1 tablet (6.25 mg total) by mouth 2 (two) times daily with a meal.   clopidogrel 75 MG tablet Commonly known as: PLAVIX Take 1 tablet (75 mg total) by mouth daily. Start taking on: October 09, 2019   donepezil 10 MG tablet Commonly known as: ARICEPT Take 1 tablet (10 mg total) by mouth at bedtime.   feeding supplement (ENSURE ENLIVE) Liqd Take 237 mLs by mouth 3 (three) times daily between meals.   hydrALAZINE 25 MG tablet Commonly known as: APRESOLINE Take 1 tablet (25 mg total) by mouth 3 (three) times daily. What changed:   medication strength  how much to take  how to take this  when to take this  additional instructions   isosorbide mononitrate 60 MG 24 hr tablet Commonly known as: IMDUR TAKE 1 TABLET(60 MG) BY MOUTH DAILY What changed: See the new instructions.   latanoprost 0.005 % ophthalmic solution Commonly known as: XALATAN Place 1 drop into both eyes at bedtime.   levothyroxine 25 MCG tablet Commonly known as: Synthroid Take 1 tablet (25 mcg total) by mouth daily before breakfast.   multivitamin with minerals Tabs tablet Take 1 tablet by mouth daily.   nitroGLYCERIN 0.4 MG SL tablet Commonly known as: NITROSTAT DISSOLVE 1 TABLET UNDER THE TONGUE EVERY 5 MINUTES AS NEEDED FOR CHEST PAIN   rosuvastatin 5 MG tablet Commonly known as: CRESTOR Take 1 tablet (5 mg total) by mouth daily at 6 PM.  vitamin B-12 100 MCG tablet Commonly known as: CYANOCOBALAMIN  Take 100 mcg by mouth daily.          Outstanding Labs/Studies   None  Duration of Discharge Encounter   Greater than 30 minutes including physician time.  Signed, Rosaria Ferries, PA-C 10/08/2019, 3:50 PM  I have examined the patient and reviewed assessment and plan and discussed with patient.  Agree with above as stated.  Complicated hospital stay.  PTCA to diagonal.  Residual RCA disease but was affected by renal failure and then severe anemia (of chronic disease).  He was hemoccult negative.  He had issues with lack of appetite as well.  We considered palliative care and medical treatment of RCA lesion, but he had two separate episodes of bradycardia with walking.  On one episode, he became hypotensive and passed out.    We then decided to transfuse him one unit of blood and perform PCI for palliative reasons.  This was done with minimal dye.  He will be sent home after the procedure, after he is hydrated. He felt well.  Wrist was stable.  Will schedule f/u with him.  He has supportive family who will help him at home.  Encouraged PO intake.  He has hospice followup and HHPT.   Plavix monotherapy to minimize bleeding risk in this frail gentlemen who had severe anemia requiring transfusion.   Larae Grooms

## 2019-10-08 NOTE — H&P (View-Only) (Signed)
Progress Note  Patient Name: Nicholas Porter Date of Encounter: 10/08/2019  Primary Cardiologist: Glenetta Hew, MD   Subjective   He was examined with his wife at bedside and reports he is doing well.  He denies chest pain, shortness of breath.  Inpatient Medications    Scheduled Meds: . sodium chloride   Intravenous Once  . carvedilol  6.25 mg Oral BID WC  . Chlorhexidine Gluconate Cloth  6 each Topical Daily  . clopidogrel  75 mg Oral Daily  . donepezil  10 mg Oral QHS  . feeding supplement (ENSURE ENLIVE)  237 mL Oral TID BM  . hydrALAZINE  25 mg Oral Q8H  . isosorbide mononitrate  60 mg Oral Daily  . latanoprost  1 drop Both Eyes QHS  . levothyroxine  25 mcg Oral QAC breakfast  . mouth rinse  15 mL Mouth Rinse BID  . rosuvastatin  5 mg Oral q1800  . sodium bicarbonate  650 mg Oral QID  . sodium chloride flush  3 mL Intravenous Q12H   Continuous Infusions: . sodium chloride    . sodium chloride 10 mL/hr at 10/06/19 1200  . sodium chloride    . sodium chloride Stopped (10/06/19 0836)   PRN Meds: sodium chloride, acetaminophen, hydrALAZINE, influenza vaccine adjuvanted, morphine injection, nitroGLYCERIN, ondansetron (ZOFRAN) IV, sodium chloride flush   Vital Signs    Vitals:   10/07/19 2200 10/07/19 2236 10/08/19 0141 10/08/19 0400  BP: (!) 150/75 (!) 159/69 (!) 187/73 (!) 163/66  Pulse:    62  Resp: (!) 22   (!) 22  Temp: 98.3 F (36.8 C) 98.4 F (36.9 C) 98.1 F (36.7 C) 98.1 F (36.7 C)  TempSrc: Oral Oral Oral Oral  SpO2:    99%  Weight:    50.6 kg  Height:        Intake/Output Summary (Last 24 hours) at 10/08/2019 0708 Last data filed at 10/08/2019 0141 Gross per 24 hour  Intake 1107 ml  Output 350 ml  Net 757 ml   Filed Weights   10/05/19 0600 10/07/19 0652 10/08/19 0400  Weight: 47.2 kg 46 kg 50.6 kg    Telemetry    Unremarkable- Personally Reviewed  ECG    None today- Personally Reviewed  Physical Exam   GEN: No acute  distress.   Cardiac: RRR, no murmurs, rubs, or gallops.  Respiratory:  Tachypneic, wheezing on lung auscultation MS: No edema; No deformity.  Labs    Chemistry Recent Labs  Lab 10/06/19 0458 10/07/19 0339 10/08/19 0442  NA 145 143 144  K 4.2 3.7 3.9  CL 120* 117* 115*  CO2 16* 17* 21*  GLUCOSE 116* 97 112*  BUN 70* 66* 63*  CREATININE 2.53* 2.32* 2.34*  CALCIUM 8.4* 8.1* 8.4*  GFRNONAA 22* 24* 24*  GFRAA 25* 28* 28*  ANIONGAP 9 9 8      Hematology Recent Labs  Lab 10/06/19 0952 10/07/19 0339 10/08/19 0301 10/08/19 0442  WBC 6.3 5.7  --  6.8  RBC 2.40* 2.19*  --  3.00*  HGB 8.1* 7.6* 10.4* 10.0*  HCT 25.1* 22.8* 29.6* 30.1*  MCV 104.6* 104.1*  --  100.3*  MCH 33.8 34.7*  --  33.3  MCHC 32.3 33.3  --  33.2  RDW 14.7 14.6  --  15.6*  PLT 88* 82*  --  97*    Cardiac EnzymesNo results for input(s): TROPONINI in the last 168 hours. No results for input(s): TROPIPOC in the last 168 hours.  BNPNo results for input(s): BNP, PROBNP in the last 168 hours.   DDimer No results for input(s): DDIMER in the last 168 hours.   Radiology    No results found.  Cardiac Studies   10/01/2019-echocardiogram  1. Left ventricular ejection fraction, by visual estimation, is 45 to 50%. The left ventricle has mildly decreased function. Normal left ventricular size. There is no left ventricular hypertrophy. 2. Apical septal segment and apex are abnormal. 3. Left ventricular diastolic Doppler parameters are consistent with impaired relaxation pattern of LV diastolic filling. 4. Global right ventricle has normal systolic function.The right ventricular size is normal. No increase in right ventricular wall thickness. 5. Left atrial size was normal. 6. Right atrial size was normal. 7. Moderate mitral annular calcification. 8. The mitral valve is abnormal. Trace mitral valve regurgitation. Mild mitral stenosis. 9. The tricuspid valve is normal in structure. Tricuspid valve  regurgitation was not visualized by color flow Doppler. 10. Aortic valve mean gradient measures 19.3 mmHg. 11. Aortic valve peak gradient measures 33.8 mmHg. 12. The aortic valve is bicuspid Aortic valve regurgitation is mild by color flow Doppler. Moderate aortic valve stenosis. 13. There is Moderate thickening of the aortic valve. 14. There is Severe calcifcation of the aortic valve. 15. The pulmonic valve was normal in structure. Pulmonic valve regurgitation is not visualized by color flow Doppler. 16. Normal pulmonary artery systolic pressure. 17. The inferior vena cava is normal in size with greater than 50% respiratory variability, suggesting right atrial pressure of 3 mmHg.   10/01/2019-left heart cath   Prox RCA to Mid RCA lesion is 20% stenosed.  Mid RCA lesion is 95% stenosed.  Mid RCA to Dist RCA lesion is 40% stenosed.  Prox Cx lesion is 50% stenosed.  1st Diag lesion is 99% stenosed.  Prox LAD lesion is 50% stenosed.  Post intervention, there is a 20% residual stenosis.  Balloon angioplasty was performed using a BALLOON SAPPHIRE 2.0X12.  Patient Profile     85 y.o.malewith coronary artery disease status post PCI of RCA andOMwho presented with chest pain and was found to have an anterior lateral STEMI. He is now status post PCI of D1 due to99% mid lesion.  Assessment & Plan    #Acute lateral STEMI status post balloon angioplasty #Coronary artery disease It was a family discussion yesterday with the patient and his wife.  Palliative medicine has assisted [place] with goals of care discussion.  The wife concluded that patient did not have capacity to make decision regarding not moving forward with coronary angiography.  At the end of the conversation, the decision was made to proceed with cholangiography and stent placement -Discontinue aspirin and Brilinta -Start Plavix -Appreciate assistance from palliative medicine -Tentatively planned for PCI today   #HFrEF Echocardiogram revealed LVEF of 45-50%.  His LVEDP was 18 mmHg.  He received a unit of PRBC yesterday and towards was noted to be mildly dyspneic and received a dose of IV Lasix.  -Continue Coreg  #Wheezing On physical exam this morning, there was noticeable wheezing on lung auscultation.  In addition, he was tachypneic with RR in the 20s though he denied shortness of breath.  He also does not report of any history of asthma or COPD.  He did undergo PRBC transfusion yesterday however has remained afebrile. -Nebulizer treatment with albuterol -Continue to monitor  #Macrocytic anemia He received 1 unit PRBC yesterday and hemoglobin has improved this morning to 10.  His iron studies reveals low iron, low ferritin and  low TIBC very much consistent with anemia of chronic disease.  His ferritin was at 100 making iron deficiency anemia very less likely. -Transfuse if Hgb <7  #Non-anion gap metabolic acidosis #Acute on chronic kidney disease stage IV sCr stable at 2.3 (baseline serum creatinineis ~2-2.4.). -Continuep.o.sodium bicarb -Continue to monitor  #Hypertension -Continuep.o. hydralazine25mg  TID -IVHydralazine as needed prn -Continue Coreg 6.25 mg twice daily -Continue Imdur 60 mg daily  #Severe protein calorie malnutrition He has significant malnutrition and would benefit from nutrition supplements such as Ensure's.  It is reassuring that he has gained some weight during this admission.  For questions or updates, please contact [place] Please consult [email or web address] for contact info under Cardiology/STEMI.      Signed, Jean Rosenthal, MD  10/08/2019, 7:08 AM    I have examined the patient and reviewed assessment and plan and discussed with patient.  Agree with above as stated.    Hbg improved with 1 U PRBCs.  Had long discussion with the family.  The patient has had several episodes where he becomes bradycardic and hypotensive with even minimal activity  while working with physical therapy.  Hopefully, revascularization of this significant RCA lesion would help.  The lesion appears focal.  There is an old stent that will act as a marker.  Hopefully, this will help limit contrast dye.  I do not think there will be a significant change in his renal function if minimal contrast is used.  His big concern is that he wants to go home today.  I think it would still be possible to send him home later this evening as long as the case was done from the right radial approach and minimal contrast was used.  We spoke at length about diet as well.  He needs to try to eat more.  I have told him he can basically eat what he wants given his age and we are considering palliative care.  I encouraged him to drink Ensure as well to help take in some protein.  All questions about cath were answered.  Please check LVEDP at the time of cath.  This will help with his fluid management.  He is able to lie flat without any difficulty.  Larae Grooms

## 2019-10-08 NOTE — Care Management Important Message (Signed)
Important Message  Patient Details  Name: Adhvaith Thousand MRN: [id number] Date of Birth: December 01, 1930   Medicare Important Message Given:  Yes     Shelda Altes 10/08/2019, 12:13 PM

## 2019-10-08 NOTE — Discharge Instructions (Signed)

## 2019-10-08 NOTE — Interval H&P Note (Signed)
Cath Lab Visit (complete for each Cath Lab visit)  Clinical Evaluation Leading to the Procedure:   ACS: Yes.    Non-ACS:  n/a   History and Physical Interval Note:  10/08/2019 10:17 AM  [name]  has presented today for surgery, with the diagnosis of angina.  The various methods of treatment have been discussed with the patient and family. After consideration of risks, benefits and other options for treatment, the patient has consented to  Procedure(s): CORONARY STENT INTERVENTION (N/A) as a surgical intervention.  The patient's history has been reviewed, patient examined, no change in status, stable for surgery.  I have reviewed the patient's chart and labs.  Questions were answered to the patient's satisfaction.     Kathlyn Sacramento

## 2019-10-10 DIAGNOSIS — E538 Deficiency of other specified B group vitamins: Secondary | ICD-10-CM | POA: Diagnosis not present

## 2019-10-10 DIAGNOSIS — Z9181 History of falling: Secondary | ICD-10-CM | POA: Diagnosis not present

## 2019-10-10 DIAGNOSIS — S20409D Unspecified superficial injuries of unspecified back wall of thorax, subsequent encounter: Secondary | ICD-10-CM | POA: Diagnosis not present

## 2019-10-10 DIAGNOSIS — I2109 ST elevation (STEMI) myocardial infarction involving other coronary artery of anterior wall: Secondary | ICD-10-CM | POA: Diagnosis not present

## 2019-10-10 DIAGNOSIS — L89151 Pressure ulcer of sacral region, stage 1: Secondary | ICD-10-CM | POA: Diagnosis not present

## 2019-10-10 DIAGNOSIS — H547 Unspecified visual loss: Secondary | ICD-10-CM | POA: Diagnosis not present

## 2019-10-10 DIAGNOSIS — T82855D Stenosis of coronary artery stent, subsequent encounter: Secondary | ICD-10-CM | POA: Diagnosis not present

## 2019-10-10 DIAGNOSIS — E611 Iron deficiency: Secondary | ICD-10-CM | POA: Diagnosis not present

## 2019-10-10 DIAGNOSIS — F0280 Dementia in other diseases classified elsewhere without behavioral disturbance: Secondary | ICD-10-CM | POA: Diagnosis not present

## 2019-10-10 DIAGNOSIS — Z7902 Long term (current) use of antithrombotics/antiplatelets: Secondary | ICD-10-CM | POA: Diagnosis not present

## 2019-10-10 DIAGNOSIS — I2510 Atherosclerotic heart disease of native coronary artery without angina pectoris: Secondary | ICD-10-CM | POA: Diagnosis not present

## 2019-10-10 DIAGNOSIS — D631 Anemia in chronic kidney disease: Secondary | ICD-10-CM | POA: Diagnosis not present

## 2019-10-10 DIAGNOSIS — N179 Acute kidney failure, unspecified: Secondary | ICD-10-CM | POA: Diagnosis not present

## 2019-10-10 DIAGNOSIS — R32 Unspecified urinary incontinence: Secondary | ICD-10-CM | POA: Diagnosis not present

## 2019-10-10 DIAGNOSIS — I080 Rheumatic disorders of both mitral and aortic valves: Secondary | ICD-10-CM | POA: Diagnosis not present

## 2019-10-10 DIAGNOSIS — D539 Nutritional anemia, unspecified: Secondary | ICD-10-CM | POA: Diagnosis not present

## 2019-10-10 DIAGNOSIS — N189 Chronic kidney disease, unspecified: Secondary | ICD-10-CM | POA: Diagnosis not present

## 2019-10-10 DIAGNOSIS — M8580 Other specified disorders of bone density and structure, unspecified site: Secondary | ICD-10-CM | POA: Diagnosis not present

## 2019-10-11 ENCOUNTER — Telehealth: Payer: Self-pay | Admitting: *Deleted

## 2019-10-11 ENCOUNTER — Encounter (HOSPITAL_COMMUNITY): Payer: Self-pay | Admitting: Cardiovascular Disease

## 2019-10-11 DIAGNOSIS — I2510 Atherosclerotic heart disease of native coronary artery without angina pectoris: Secondary | ICD-10-CM | POA: Diagnosis not present

## 2019-10-11 DIAGNOSIS — S20409D Unspecified superficial injuries of unspecified back wall of thorax, subsequent encounter: Secondary | ICD-10-CM | POA: Diagnosis not present

## 2019-10-11 DIAGNOSIS — I2109 ST elevation (STEMI) myocardial infarction involving other coronary artery of anterior wall: Secondary | ICD-10-CM | POA: Diagnosis not present

## 2019-10-11 DIAGNOSIS — L89151 Pressure ulcer of sacral region, stage 1: Secondary | ICD-10-CM | POA: Diagnosis not present

## 2019-10-11 DIAGNOSIS — T82855D Stenosis of coronary artery stent, subsequent encounter: Secondary | ICD-10-CM | POA: Diagnosis not present

## 2019-10-11 DIAGNOSIS — E538 Deficiency of other specified B group vitamins: Secondary | ICD-10-CM | POA: Diagnosis not present

## 2019-10-11 NOTE — Consult Note (Signed)
   [facility] CM Inpatient Consult   10/11/2019  Avrumi Corell 03-Mar-1930 [id number]    Follow-up Notes:   Following this Medicare/ [employer] patient with high risk score for unplanned readmission and hospitalization.   Transition of care CM note reveals that family wants to go home with [facility]), declined Hospice Services. Patient was referred to [facility] for Outpatient Palliative services. Called and confirmed with Tammy from [place] that referral was received and states that they will be following patient at home.   No [facility] care management services identified at this time.   For questions and referral, please contact:  Khristi Schiller A. Kehinde Totzke, BSN, RN-BC [facility] Liaison Cell: [phone]

## 2019-10-11 NOTE — Telephone Encounter (Signed)
Copied from [place] [id number]. Topic: General - Other >> Oct 11, 2019 10:22 AM Carolyn Stare wrote: Manus Gunning with Pallivative  Care req authorizations to start care   [phone]

## 2019-10-12 ENCOUNTER — Telehealth: Payer: Self-pay | Admitting: Adult Health

## 2019-10-12 DIAGNOSIS — T82855D Stenosis of coronary artery stent, subsequent encounter: Secondary | ICD-10-CM | POA: Diagnosis not present

## 2019-10-12 DIAGNOSIS — I2109 ST elevation (STEMI) myocardial infarction involving other coronary artery of anterior wall: Secondary | ICD-10-CM | POA: Diagnosis not present

## 2019-10-12 DIAGNOSIS — S20409D Unspecified superficial injuries of unspecified back wall of thorax, subsequent encounter: Secondary | ICD-10-CM | POA: Diagnosis not present

## 2019-10-12 DIAGNOSIS — E538 Deficiency of other specified B group vitamins: Secondary | ICD-10-CM | POA: Diagnosis not present

## 2019-10-12 DIAGNOSIS — I2510 Atherosclerotic heart disease of native coronary artery without angina pectoris: Secondary | ICD-10-CM | POA: Diagnosis not present

## 2019-10-12 DIAGNOSIS — L89151 Pressure ulcer of sacral region, stage 1: Secondary | ICD-10-CM | POA: Diagnosis not present

## 2019-10-12 NOTE — Telephone Encounter (Signed)
Dena with [facility] called in to see if PCP would be signing off on pt's plan of care? Pt was discharged from the hospital with orders for PT, OT and skilled nursing.   She would also like to discuss a medication that the pt is taking and a few Dx that she is showing ( Arthritis, copd, and anxiety )   Please call: [phone]

## 2019-10-13 DIAGNOSIS — T82855D Stenosis of coronary artery stent, subsequent encounter: Secondary | ICD-10-CM | POA: Diagnosis not present

## 2019-10-13 DIAGNOSIS — E538 Deficiency of other specified B group vitamins: Secondary | ICD-10-CM | POA: Diagnosis not present

## 2019-10-13 DIAGNOSIS — I2109 ST elevation (STEMI) myocardial infarction involving other coronary artery of anterior wall: Secondary | ICD-10-CM | POA: Diagnosis not present

## 2019-10-13 DIAGNOSIS — S20409D Unspecified superficial injuries of unspecified back wall of thorax, subsequent encounter: Secondary | ICD-10-CM | POA: Diagnosis not present

## 2019-10-13 DIAGNOSIS — I2510 Atherosclerotic heart disease of native coronary artery without angina pectoris: Secondary | ICD-10-CM | POA: Diagnosis not present

## 2019-10-13 DIAGNOSIS — L89151 Pressure ulcer of sacral region, stage 1: Secondary | ICD-10-CM | POA: Diagnosis not present

## 2019-10-13 NOTE — Telephone Encounter (Signed)
I am fine with signing off on orders.   Which medications do they have questions about?

## 2019-10-13 NOTE — Telephone Encounter (Signed)
Spoke to [facility] and advised that [name] will sign orders.  Pt has isosorbide listed as an allergy but takes the medication daily.  Advised DeAnna that pt has been taking isosorbide so I'm not sure why it is listed as an allergy.  Medication will be continued.  Nothing further needed.

## 2019-10-13 NOTE — Telephone Encounter (Signed)
[place] for authorization

## 2019-10-13 NOTE — Telephone Encounter (Signed)
Left a message for a return call.

## 2019-10-13 NOTE — Telephone Encounter (Signed)
Spoke to [place] and authorized palliative care.  Nothing further needed.

## 2019-10-15 DIAGNOSIS — I2109 ST elevation (STEMI) myocardial infarction involving other coronary artery of anterior wall: Secondary | ICD-10-CM | POA: Diagnosis not present

## 2019-10-15 DIAGNOSIS — I2510 Atherosclerotic heart disease of native coronary artery without angina pectoris: Secondary | ICD-10-CM | POA: Diagnosis not present

## 2019-10-15 DIAGNOSIS — L89151 Pressure ulcer of sacral region, stage 1: Secondary | ICD-10-CM | POA: Diagnosis not present

## 2019-10-15 DIAGNOSIS — S20409D Unspecified superficial injuries of unspecified back wall of thorax, subsequent encounter: Secondary | ICD-10-CM | POA: Diagnosis not present

## 2019-10-15 DIAGNOSIS — T82855D Stenosis of coronary artery stent, subsequent encounter: Secondary | ICD-10-CM | POA: Diagnosis not present

## 2019-10-15 DIAGNOSIS — E538 Deficiency of other specified B group vitamins: Secondary | ICD-10-CM | POA: Diagnosis not present

## 2019-10-17 ENCOUNTER — Other Ambulatory Visit: Payer: Self-pay | Admitting: Neurology

## 2019-10-17 NOTE — Progress Notes (Signed)
Cardiology Office Note   Date:  10/18/2019   ID:  Nicholas Porter, DOB 1930/03/13, MRN [id number]  PCP:  Dorothyann Peng, NP  Cardiologist: Dr.Harding  CC: Hospital Follow Up    History of Present Illness: Nicholas Porter is a 85 y.o. male who presents for posthospitalization follow-up after admission for STEMI of the anterior lateral wall, and was also found to have acute kidney injury superimposed on chronic kidney disease.  He also has a history of dementia.  The patient was admitted on 10/01/2019, and had emergent cardiac catheterization.  He had PTCA to the first diagonal and PCI to the right coronary artery.  He was placed on high-dose statin therapy and beta-blocker, isosorbide was also added.  Because of chronic kidney disease ACE and or ARB was not instituted.  He was also noted to have elevated lactate level of 3.2 which was felt to be related to chronic kidney disease.  He did require sodium bicarbonate during hospitalization.  He was also found to be anemic during hospitalization with a hemoglobin of 7.6.  FOBT's were negative.  He was given 1 unit of packed red blood cells along with IV Lasix.  Echocardiogram during hospitalization revealed an EF of 45% to 50%.  He was also found to have continued hypertension during hospitalization, hydralazine was added along with up titration of isosorbide.  The patient was sent home with PT and home health as well as 24-hour supervision assistance by family members.  After discussion with family members, it was decided that a palliative care consult would be beneficial.  He was made a DNR.  Discharge medications included carvedilol, 6.25 mg twice daily, Plavix 75 mg daily, hydralazine 25 mg 3 times daily, isosorbide 60 mg daily, and rosuvastatin 5 mg daily.  He was not placed on aspirin due to anemia.  Nicholas Porter comes today with complaints of worsening breathing.  He continues with physical therapy but does not feel as if he is progressing very well.   He also complains of chronic lower extremity edema.  He denies any dizziness.  He has not had any recurrent chest pain or bleeding issues.   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   S/p ulcer surgury 1963 with vagotomy, partial gastrectomy with Bilroth I gastroenterostomy    CAD S/P percutaneous coronary angioplasty - DES in RCA with PTCA for ISR; Moderate mLAD, 80% ostial OM2 stable 05/22/2008   Qualifier: Diagnosis of  By: Linda Hedges MD, Heinz Knuckles    CKD (chronic kidney disease) stage 3, GFR 30-59 ml/min    COPD (chronic obstructive pulmonary disease) ([place])    Dementia ([place]) 05/13/2019   Depression    Dyspnea    with activity    Essential hypertension 06/30/2007   Qualifier: Diagnosis of  By: Cori Razor RN, Mikal Plane     Eye abnormalities    injections in eyes 12/2017   H/O Inferior STEMI (09/2013) emergent PCI with Promus DES to RCA (3.0 mm  x 28 mm - 3.2 mm) 10/05/2013   Hypothyroidism    Klebsiella sepsis ([place]) 04/02/2017   Left-sided carotid artery disease -- s/p CEA 04/06/2006   S/p Left CEA    Mitral regurgitation - onexam 09/03/2016    Past  Surgical History:  Procedure Laterality Date   AMPUTATION     left index finger -tramatic   BILIARY STENT PLACEMENT N/A 01/06/2019   Procedure: BILIARY STENT PLACEMENT;  Surgeon: Clarene Essex, MD;  Location: [facility];  Service: Endoscopy;  Laterality: N/A;   CARDIAC CATHETERIZATION  November 2011   40% mid LAD, 50% proximal RCA, 30-40% distal RCA (DOMINANT RCA with wraparound PDA & 2 large PLBs), 60-70% ostial OM1. No change from 2001. Medical therapy   CAROTID ENDARTERECTOMY Left 2007   Dr.Hayes: 08/2016 Dopplers: Stable less than 40% right internal carotid stenosis and  stable moderate 40-60% left internal carotid stenosis. Patent vertebral and subclavian arteries.   CARPAL TUNNEL RELEASE     CHOLECYSTECTOMY N/A 01/02/2019   Procedure: LAPAROSCOPIC CHOLECYSTECTOMY;  Surgeon: Michael Boston, MD;  Location: [facility];  Service: General;  Laterality: N/A;   CORNEAL TRANSPLANT     CORONARY ANGIOPLASTY WITH STENT PLACEMENT  09/2013; 05/2014    d) Promus DES 3.0 mm x 28 mm (3.2 mm)  to RCA with STEMI; residual 70% mid-distal LAD, OM 270-80%. Medical management; b) PTCA of 75 % ISR , LAD lesion noted as ~40%   CORONARY STENT INTERVENTION N/A 10/08/2019   Procedure: CORONARY STENT INTERVENTION;  Surgeon: Wellington Hampshire, MD;  Location: [place] [facility];  Service: Cardiovascular;  Laterality: N/A;   CORONARY/GRAFT ACUTE MI REVASCULARIZATION N/A 10/01/2019   Procedure: Coronary/Graft Acute MI Revascularization;  Surgeon: Burnell Blanks, MD;  Location: [place] [facility];  Service: Cardiovascular;  Laterality: N/A;   ERCP N/A 01/06/2019   Procedure: ENDOSCOPIC RETROGRADE CHOLANGIOPANCREATOGRAPHY (ERCP);  Surgeon: Clarene Essex, MD;  Location: [facility];  Service: Endoscopy;  Laterality: N/A;   ERCP N/A 05/28/2019   Procedure: ENDOSCOPIC RETROGRADE CHOLANGIOPANCREATOGRAPHY (ERCP);  Surgeon: Clarene Essex, MD;  Location: [facility];  Service: Endoscopy;  Laterality: N/A;   FOOT SURGERY     left   HERNIA REPAIR     LEFT HEART CATH AND CORONARY ANGIOGRAPHY N/A 10/01/2019   Procedure: LEFT HEART CATH AND CORONARY ANGIOGRAPHY;  Surgeon: Burnell Blanks, MD;  Location: [place] [facility];  Service: Cardiovascular;  Laterality: N/A;   LEFT HEART CATHETERIZATION WITH CORONARY ANGIOGRAM N/A 10/05/2013   Procedure: LEFT HEART CATHETERIZATION WITH CORONARY ANGIOGRAM;  Surgeon: Peter M Martinique, MD;  [facility] CATH LAB;  Service: Cardiovascular;  RCA 100% - PCI,CxOM stable, LAD ~70% mid;   LEFT HEART CATHETERIZATION WITH CORONARY ANGIOGRAM N/A 06/08/2014    Procedure: LEFT HEART CATHETERIZATION WITH CORONARY ANGIOGRAM;  Surgeon: Troy Sine, MD;  Location: [facility] CATH LAB;  Service: Cardiovascular;  UA 6/'15: 75% ISR in RCA - PTCA, LAD lesion ~40%, CxOM stable.     NM MYOVIEW LTD  03/2017    - Elevatred Troponin in setting of Sepsis -- DEMAND ISCHEMIA.  Normal perfusion. LVEF 51% with normal wall motion. This is a low risk study.   PARTIAL GASTRECTOMY     PERCUTANEOUS CORONARY INTERVENTION-BALLOON ONLY  06/08/2014   Procedure: PERCUTANEOUS CORONARY INTERVENTION-BALLOON ONLY;  Surgeon: Troy Sine, MD;  Location: [facility] CATH LAB;  Service: Cardiovascular;;   REMOVAL OF STONES  05/28/2019   Procedure: REMOVAL OF STONES;  Surgeon: Clarene Essex, MD;  Location: [place];  Service: Endoscopy;;   ROTATOR CUFF REPAIR     left   SHOULDER ARTHROSCOPY WITH ROTATOR CUFF REPAIR AND SUBACROMIAL DECOMPRESSION Right 07/01/2013   Procedure: RIGHT SHOULDER ARTHROSCOPY WITH  SUBACROMIAL DECOMPRESSION/DISTAL CLAVICLE RESECTION/ROTATOR CUFF REPAIR ;  Surgeon: Marin Shutter,  MD;  Location: [place];  Service: Orthopedics;  Laterality: Right;   SPHINCTEROTOMY  01/06/2019   Procedure: SPHINCTEROTOMY;  Surgeon: Clarene Essex, MD;  Location: [facility];  Service: Endoscopy;;   [place]   cervical laminectomy   STENT REMOVAL  05/28/2019   Procedure: STENT REMOVAL;  Surgeon: Clarene Essex, MD;  Location: [place];  Service: Endoscopy;;   TOTAL KNEE ARTHROPLASTY     TRANSTHORACIC ECHOCARDIOGRAM  09/'17; 4/'18; 1/'20   a) Normal EF 55-60%. Mild to moderate AS with mod AI. Mild MR.;; b) Mild global reduction in LV systolic function: [id number]; grade 1 DD - high LVEDP. Mild AS/AI/TR. Mild-Mod MR; moderately elevated PAP.;; c) TTE 12/2018: EF 55-60%. No RWMA. Gr1 DD. Mod AS with mild AI. Severe LA dilation.   VAGOTOMY     partial gastrectomy     Current Outpatient Medications  Medication Sig Dispense Refill   carvedilol (COREG) 6.25 MG tablet Take 1 tablet  (6.25 mg total) by mouth 2 (two) times daily with a meal. 60 tablet 11   clopidogrel (PLAVIX) 75 MG tablet Take 1 tablet (75 mg total) by mouth daily. 30 tablet 11   donepezil (ARICEPT) 10 MG tablet Take 1 tablet (10 mg total) by mouth at bedtime. 30 tablet 3   feeding supplement, ENSURE ENLIVE, (ENSURE ENLIVE) LIQD Take 237 mLs by mouth 3 (three) times daily between meals. 237 mL 12   hydrALAZINE (APRESOLINE) 25 MG tablet Take 1 tablet (25 mg total) by mouth 3 (three) times daily. 90 tablet 0   isosorbide mononitrate (IMDUR) 60 MG 24 hr tablet TAKE 1 TABLET(60 MG) BY MOUTH DAILY (Patient taking differently: Take 60 mg by mouth daily. ) 90 tablet 0   latanoprost (XALATAN) 0.005 % ophthalmic solution Place 1 drop into both eyes at bedtime.     levothyroxine (SYNTHROID) 25 MCG tablet Take 1 tablet (25 mcg total) by mouth daily before breakfast. 90 tablet 1   nitroGLYCERIN (NITROSTAT) 0.4 MG SL tablet DISSOLVE 1 TABLET UNDER THE TONGUE EVERY 5 MINUTES AS NEEDED FOR CHEST PAIN 25 tablet 12   rosuvastatin (CRESTOR) 5 MG tablet Take 1 tablet (5 mg total) by mouth daily at 6 PM. 30 tablet 11   vitamin B-12 (CYANOCOBALAMIN) 100 MCG tablet Take 100 mcg by mouth daily.     No current facility-administered medications for this visit.     Allergies:   Amlodipine besylate, Lipitor [atorvastatin], Naproxen, Oxycodone-acetaminophen, and Isosorbide    Social History:  The patient  reports that he quit smoking about 54 years ago. His smoking use included cigarettes. He quit after 15.00 years of use. He has never used smokeless tobacco. He reports that he does not drink alcohol or use drugs.   Family History:  The patient's family history includes Diabetes in his sister; Heart disease in his sister.   The hospital prescriptions and $20 and she could not afford it so ROS: All other systems are reviewed and negative. Unless otherwise mentioned in H&P    PHYSICAL EXAM: VS:  BP (!) 154/56    Pulse (!)  55    Ht 5\' 7"  (1.702 m)    Wt 103 lb 9.6 oz (47 kg)    BMI 16.23 kg/m  , BMI Body mass index is 16.23 kg/m. GEN: Well nourished, well developed, in no acute distress HEENT: normal Neck: no JVD, bilateral carotid bruits, or masses Cardiac: RRR; 2/6 high 1 month-pitched systolic murmurs, rubs, or gallops,no 2+ edema  Respiratory:  Clear  to auscultation bilaterally, normal work of breathing GI: soft, nontender, nondistended, + BS MS: no deformity or atrophy Skin: warm and dry, no rash.Pale  Neuro:  Strength and sensation are intact Psych: euthymic mood, full affect   EKG:  Sinus bradycardia rate of 55 bpm   Recent Labs: 01/03/2019: Magnesium 1.8 10/01/2019: ALT 17; TSH 5.576 10/08/2019: BUN 63; Creatinine, Ser 2.34; Hemoglobin 10.0; Platelets 97; Potassium 3.9; Sodium 144    Lipid Panel    Component Value Date/Time   CHOL 147 10/01/2019 0409   TRIG 114 10/01/2019 0409   TRIG 91 10/07/2006 1055   HDL 60 10/01/2019 0409   CHOLHDL 2.5 10/01/2019 0409   VLDL 23 10/01/2019 0409   LDLCALC 64 10/01/2019 0409      Wt Readings from Last 3 Encounters:  10/18/19 103 lb 9.6 oz (47 kg)  10/08/19 111 lb 8.8 oz (50.6 kg)  07/19/19 92 lb 4 oz (41.8 kg)      Other studies Reviewed: CORONARY STENT INTERVENTION: 10/08/2019  Prox RCA to Mid RCA lesion is 20% stenosed.  Mid RCA lesion is 95% stenosed.  Mid RCA to Dist RCA lesion is 40% stenosed.  Post intervention, there is a 0% residual stenosis.  A drug-eluting stent was successfully placed using a STENT RESOLUTE ONYX 3.0X18.  1. Moderately elevated left ventricular end-diastolic pressure at 22 mmHg. 2. Successful direct stenting of the mid right coronary artery. The stent overlapped with the previously placed stent. Ostial RCA spasm improved with intracoronary nitroglycerin.  Recommendations: Continue treatment with Plavix without aspirin given recent bleeding issues. Only 25 mL of contrast was used. We will gently  hydrate for few hours. Intervention     CARDIAC CATH: 10/01/2019  Prox RCA to Mid RCA lesion is 20% stenosed.  Mid RCA lesion is 95% stenosed.  Mid RCA to Dist RCA lesion is 40% stenosed.  Prox Cx lesion is 50% stenosed.  1st Diag lesion is 99% stenosed.  Prox LAD lesion is 50% stenosed.  Post intervention, there is a 20% residual stenosis.  Balloon angioplasty was performed using a BALLOON SAPPHIRE 2.0X12.  1. Acute anterolateral STEMI secondary to thrombotic occlusion of the small to moderate caliber first Diagonal branch 2. Successful PTCA with balloon angioplasty of the Diagonal branch. No stent was placed given the relatively small size of the vessel. Flow was established down the Diagonal with balloon angioplasty only. 3. Moderate mid LAD stenosis at the takeoff of the Diagonal branch which does not appear to be flow limiting 4. Moderate mid proximal to mid Circumflex stenosis which does not appear to be flow limiting.  5. The RCA is a large dominant vessel with a patent mid stented segment with mild stent restenosis. There is a severe stenosis in the mid RCA just beyond the old stented segment.   Recommendations: Will reload Brilinta 180 mg po x 1 in the ICU as the patient had an episode of emesis prior to leaving the cath lab. Will continue Aggrastat drip for 18 hours given thrombotic burden. Will continue ASA/Brilinta for 12 months. Consider starting a beta blocker later today pending his BP. He has apparently had intolerance to statins in the past (itching noted in record). Will explore this and consider another statin prior to discharge. I would anticipate that we will gently hydrate over the next 24 hours and if renal function remains stable, will plan staged PCI of the RCA next week.  Diagnostic Dominance: Right   Intervention     ECHO: 10/01/2019 1.  Left ventricular ejection fraction, by visual estimation, is 45 to 50%. The left ventricle has mildly  decreased function. Normal left ventricular size. There is no left ventricular hypertrophy. 2. Apical septal segment and apex are abnormal. 3. Left ventricular diastolic Doppler parameters are consistent with impaired relaxation pattern of LV diastolic filling. 4. Global right ventricle has normal systolic function.The right ventricular size is normal. No increase in right ventricular wall thickness. 5. Left atrial size was normal. 6. Right atrial size was normal. 7. Moderate mitral annular calcification. 8. The mitral valve is abnormal. Trace mitral valve regurgitation. Mild mitral stenosis. 9. The tricuspid valve is normal in structure. Tricuspid valve regurgitation was not visualized by color flow Doppler. 10. Aortic valve mean gradient measures 19.3 mmHg. 11. Aortic valve peak gradient measures 33.8 mmHg. 12. The aortic valve is bicuspid Aortic valve regurgitation is mild by color flow Doppler. Moderate aortic valve stenosis. 13. There is Moderate thickening of the aortic valve. 14. There is Severe calcifcation of the aortic valve. 15. The pulmonic valve was normal in structure. Pulmonic valve regurgitation is not visualized by color flow Doppler. 16. Normal pulmonary artery systolic pressure. 17. The inferior vena cava is normal in size with greater than 50% respiratory variability, suggesting right atrial pressure of 3 mmHg.  FINDINGS Left Ventricle: Left ventricular ejection fraction, by visual estimation, is 45 to 50%. The left ventricle has mildly decreased function. There is no left ventricular hypertrophy. Normal left ventricular size. Spectral Doppler shows Left ventricular  diastolic Doppler parameters are consistent with impaired relaxation pattern of LV diastolic filling.   ASSESSMENT AND PLAN:  1.  Coronary artery disease: Status post STEMI of the right coronary artery, requiring emergent catheterization, PTCA to the first diagonal and PCI to the right coronary  artery.  The patient continues to have chronic shortness of breath with exertion which he feels is worse since discharge home.  The patient denies recurrent chest pain but he also feels his heart rate go up when he changes position.  He states he cannot lie flat in his bed without having to sit up to breathe.  He is frail and easily anxious.  2. Hypertension: BP is elevated today. I have rechecked in the exam room and found it to correlate with initial BP They do not have a BP machine at home. They have a wrist cuff but it malfunctions. Cannot afford one. We will have one sent to her home. Will not make any changes in regimen for now as he is anxious today in the office.   3.  Ischemic Cardiomyopathy: EF 45%-50%.  I think his LEE is multifactorial, from hydralazine and isosorbide,  Moderate to severe AoV stenosis, along with chronic anemia.  Will need follow up one month.  4. Financial Insecurity: Trouble affording medications. Given [facility] [employer] gift card, and referral to social worker in our office to help with assistance. He is a DNR and on Palliative Care. She may be able to work with this patient and his family concerning their needs.   5. Moderate to Severe Aortic Valve Stenosis: No planned intervention. This may be contributing to his dyspnea as well.   Current medicines are reviewed at length with the patient today.  We have spent 45 minutes with patient discussing their multiple needs and questions.    Labs/ tests ordered today include: None  Phill Myron. [place] Pugh, ANP, AACC   10/18/2019 12:13 PM     [facility] [place] [address] Office (  [phone] Fax [phone]  Notice: This dictation was prepared with Dragon dictation along with smaller phrase technology. Any transcriptional errors that result from this process are unintentional and may not be corrected upon review.

## 2019-10-18 ENCOUNTER — Other Ambulatory Visit: Payer: Self-pay

## 2019-10-18 ENCOUNTER — Telehealth (HOSPITAL_COMMUNITY): Payer: Self-pay | Admitting: Licensed Clinical Social Worker

## 2019-10-18 ENCOUNTER — Ambulatory Visit (INDEPENDENT_AMBULATORY_CARE_PROVIDER_SITE_OTHER): Payer: Medicare Other | Admitting: Adult Health

## 2019-10-18 ENCOUNTER — Encounter: Payer: Self-pay | Admitting: Adult Health

## 2019-10-18 VITALS — BP 154/56 | HR 55 | Ht 67.0 in | Wt 103.6 lb

## 2019-10-18 DIAGNOSIS — D649 Anemia, unspecified: Secondary | ICD-10-CM

## 2019-10-18 DIAGNOSIS — I6522 Occlusion and stenosis of left carotid artery: Secondary | ICD-10-CM

## 2019-10-18 DIAGNOSIS — I2109 ST elevation (STEMI) myocardial infarction involving other coronary artery of anterior wall: Secondary | ICD-10-CM

## 2019-10-18 DIAGNOSIS — T82855D Stenosis of coronary artery stent, subsequent encounter: Secondary | ICD-10-CM | POA: Diagnosis not present

## 2019-10-18 DIAGNOSIS — I10 Essential (primary) hypertension: Secondary | ICD-10-CM

## 2019-10-18 DIAGNOSIS — S20409D Unspecified superficial injuries of unspecified back wall of thorax, subsequent encounter: Secondary | ICD-10-CM | POA: Diagnosis not present

## 2019-10-18 DIAGNOSIS — Z9861 Coronary angioplasty status: Secondary | ICD-10-CM | POA: Diagnosis not present

## 2019-10-18 DIAGNOSIS — I2510 Atherosclerotic heart disease of native coronary artery without angina pectoris: Secondary | ICD-10-CM | POA: Diagnosis not present

## 2019-10-18 DIAGNOSIS — I5042 Chronic combined systolic (congestive) and diastolic (congestive) heart failure: Secondary | ICD-10-CM

## 2019-10-18 DIAGNOSIS — I350 Nonrheumatic aortic (valve) stenosis: Secondary | ICD-10-CM | POA: Diagnosis not present

## 2019-10-18 DIAGNOSIS — E538 Deficiency of other specified B group vitamins: Secondary | ICD-10-CM | POA: Diagnosis not present

## 2019-10-18 DIAGNOSIS — L89151 Pressure ulcer of sacral region, stage 1: Secondary | ICD-10-CM | POA: Diagnosis not present

## 2019-10-18 MED ORDER — ISOSORBIDE MONONITRATE ER 60 MG PO TB24: ORAL_TABLET | ORAL | 6 refills | Status: DC

## 2019-10-18 MED ORDER — HYDRALAZINE HCL 25 MG PO TABS: 25.0000 mg | ORAL_TABLET | Freq: Three times a day (TID) | ORAL | 6 refills | Status: DC

## 2019-10-18 MED ORDER — CARVEDILOL 6.25 MG PO TABS: 6.2500 mg | ORAL_TABLET | Freq: Two times a day (BID) | ORAL | 11 refills | Status: DC

## 2019-10-18 MED ORDER — CLOPIDOGREL BISULFATE 75 MG PO TABS: 75.0000 mg | ORAL_TABLET | Freq: Every day | ORAL | 11 refills | Status: DC

## 2019-10-18 MED ORDER — ROSUVASTATIN CALCIUM 5 MG PO TABS: 5.0000 mg | ORAL_TABLET | Freq: Every day | ORAL | 11 refills | Status: DC

## 2019-10-18 NOTE — Patient Instructions (Signed)
Medication Instructions:  Continue current medications  *If you need a refill on your cardiac medications before your next appointment, please call your pharmacy*  Lab Work: None Ordered  Testing/Procedures: None Ordered  Follow-Up: At [facility], you and your health needs are our priority.  As part of our continuing mission to provide you with exceptional heart care, we have created designated Provider Care Teams.  These Care Teams include your primary Cardiologist (physician) and Advanced Practice Providers (APPs -  Physician Assistants and Nurse Practitioners) who all work together to provide you with the care you need, when you need it.  Your next appointment:   1 Month  The format for your next appointment:   In Person  Provider:   Jory Sims, DNP, ANP

## 2019-10-18 NOTE — Telephone Encounter (Signed)
CSW consulted to speak with pt regarding BP cuff, medication cost concerns, and food insecurity concerns.  CSW first confirmed pt is agreeable to receiving BP cuff and confirmed address- Cuff ordered and anticipated delivery on Wednesday.  CSW then spoke with pt regarding medication cost concerns.  Wife cannot report a particular medication that is causing them concerns but just states the cost of both of their medications is a lot when they are on such a fixed income and have to use over half of that each month on mortgage and utility costs.  After discussing pt and wifes income and assets it is likely they would qualify for the LIS and Extra Help programs which could make it so they don't have to pay their medicare insurance premiums and potentially have reduced cost medications (only $3-8 per prescription).  CSW completed application for both patient and wife online- they will hear in a few weeks regarding eligibility.  CSW then inquired about current food security.  They report that food has been somewhat difficult to afford reliably but that their daughter and granddaughter help out when they are in trouble.  Report that they currently have sufficient food- CSW had pt wife write down my number so she can call if food becomes an urgent problem in the near future.  Wife reports they are aware of a couple of food pantries but have not gone to them at this point.  CSW will continue to follow and assist as needed  Jorge Ny, [facility] Desk#: [phone] Cell#: [phone]

## 2019-10-19 DIAGNOSIS — I2109 ST elevation (STEMI) myocardial infarction involving other coronary artery of anterior wall: Secondary | ICD-10-CM | POA: Diagnosis not present

## 2019-10-19 DIAGNOSIS — I2510 Atherosclerotic heart disease of native coronary artery without angina pectoris: Secondary | ICD-10-CM | POA: Diagnosis not present

## 2019-10-19 DIAGNOSIS — L89151 Pressure ulcer of sacral region, stage 1: Secondary | ICD-10-CM | POA: Diagnosis not present

## 2019-10-19 DIAGNOSIS — T82855D Stenosis of coronary artery stent, subsequent encounter: Secondary | ICD-10-CM | POA: Diagnosis not present

## 2019-10-19 DIAGNOSIS — S20409D Unspecified superficial injuries of unspecified back wall of thorax, subsequent encounter: Secondary | ICD-10-CM | POA: Diagnosis not present

## 2019-10-19 DIAGNOSIS — E538 Deficiency of other specified B group vitamins: Secondary | ICD-10-CM | POA: Diagnosis not present

## 2019-10-20 ENCOUNTER — Other Ambulatory Visit: Payer: Self-pay | Admitting: Family Medicine

## 2019-10-20 DIAGNOSIS — E039 Hypothyroidism, unspecified: Secondary | ICD-10-CM

## 2019-10-20 DIAGNOSIS — L89151 Pressure ulcer of sacral region, stage 1: Secondary | ICD-10-CM | POA: Diagnosis not present

## 2019-10-20 DIAGNOSIS — T82855D Stenosis of coronary artery stent, subsequent encounter: Secondary | ICD-10-CM | POA: Diagnosis not present

## 2019-10-20 DIAGNOSIS — E538 Deficiency of other specified B group vitamins: Secondary | ICD-10-CM | POA: Diagnosis not present

## 2019-10-20 DIAGNOSIS — I2510 Atherosclerotic heart disease of native coronary artery without angina pectoris: Secondary | ICD-10-CM | POA: Diagnosis not present

## 2019-10-20 DIAGNOSIS — I2109 ST elevation (STEMI) myocardial infarction involving other coronary artery of anterior wall: Secondary | ICD-10-CM | POA: Diagnosis not present

## 2019-10-20 DIAGNOSIS — S20409D Unspecified superficial injuries of unspecified back wall of thorax, subsequent encounter: Secondary | ICD-10-CM | POA: Diagnosis not present

## 2019-10-20 MED ORDER — LEVOTHYROXINE SODIUM 25 MCG PO TABS: 25.0000 ug | ORAL_TABLET | Freq: Every day | ORAL | 1 refills | Status: DC

## 2019-10-21 ENCOUNTER — Ambulatory Visit: Payer: Medicare Other | Admitting: Neurology

## 2019-10-22 DIAGNOSIS — T82855D Stenosis of coronary artery stent, subsequent encounter: Secondary | ICD-10-CM | POA: Diagnosis not present

## 2019-10-22 DIAGNOSIS — S20409D Unspecified superficial injuries of unspecified back wall of thorax, subsequent encounter: Secondary | ICD-10-CM | POA: Diagnosis not present

## 2019-10-22 DIAGNOSIS — I2510 Atherosclerotic heart disease of native coronary artery without angina pectoris: Secondary | ICD-10-CM | POA: Diagnosis not present

## 2019-10-22 DIAGNOSIS — L89151 Pressure ulcer of sacral region, stage 1: Secondary | ICD-10-CM | POA: Diagnosis not present

## 2019-10-22 DIAGNOSIS — E538 Deficiency of other specified B group vitamins: Secondary | ICD-10-CM | POA: Diagnosis not present

## 2019-10-22 DIAGNOSIS — I2109 ST elevation (STEMI) myocardial infarction involving other coronary artery of anterior wall: Secondary | ICD-10-CM | POA: Diagnosis not present

## 2019-10-25 DIAGNOSIS — E538 Deficiency of other specified B group vitamins: Secondary | ICD-10-CM | POA: Diagnosis not present

## 2019-10-25 DIAGNOSIS — T82855D Stenosis of coronary artery stent, subsequent encounter: Secondary | ICD-10-CM | POA: Diagnosis not present

## 2019-10-25 DIAGNOSIS — I2109 ST elevation (STEMI) myocardial infarction involving other coronary artery of anterior wall: Secondary | ICD-10-CM | POA: Diagnosis not present

## 2019-10-25 DIAGNOSIS — L89151 Pressure ulcer of sacral region, stage 1: Secondary | ICD-10-CM | POA: Diagnosis not present

## 2019-10-25 DIAGNOSIS — I2510 Atherosclerotic heart disease of native coronary artery without angina pectoris: Secondary | ICD-10-CM | POA: Diagnosis not present

## 2019-10-25 DIAGNOSIS — S20409D Unspecified superficial injuries of unspecified back wall of thorax, subsequent encounter: Secondary | ICD-10-CM | POA: Diagnosis not present

## 2019-10-26 ENCOUNTER — Telehealth: Payer: Self-pay | Admitting: Adult Health

## 2019-10-26 DIAGNOSIS — E538 Deficiency of other specified B group vitamins: Secondary | ICD-10-CM | POA: Diagnosis not present

## 2019-10-26 DIAGNOSIS — I2109 ST elevation (STEMI) myocardial infarction involving other coronary artery of anterior wall: Secondary | ICD-10-CM | POA: Diagnosis not present

## 2019-10-26 DIAGNOSIS — L89151 Pressure ulcer of sacral region, stage 1: Secondary | ICD-10-CM | POA: Diagnosis not present

## 2019-10-26 DIAGNOSIS — I2510 Atherosclerotic heart disease of native coronary artery without angina pectoris: Secondary | ICD-10-CM | POA: Diagnosis not present

## 2019-10-26 DIAGNOSIS — S20409D Unspecified superficial injuries of unspecified back wall of thorax, subsequent encounter: Secondary | ICD-10-CM | POA: Diagnosis not present

## 2019-10-26 DIAGNOSIS — T82855D Stenosis of coronary artery stent, subsequent encounter: Secondary | ICD-10-CM | POA: Diagnosis not present

## 2019-10-26 NOTE — Telephone Encounter (Signed)
Spoke with pt's wife and had forgotten to mention at last weeks appt pt had significant nose bleed the week before the appt and pt's wife noted pt having another nosebleed this afternoon Per wife pt had same issue when was on Plavix previously Encouraged wife to try nasal saline spray and will forward message to Jory Sims NP for review and recommendations.cy

## 2019-10-26 NOTE — Telephone Encounter (Signed)
Please have him get a CBC to make sure his Hgb is not too low, he has anemia and required blood transfusion while in the hospital. He is very frail, do not want him to fall if Hgb is too low.  Also, have him try an OTC medicine, Ayr. Its a saline gel that can help with this. He can also try a little Vasoline topically, but not to much as to block his nasal passages.

## 2019-10-26 NOTE — Telephone Encounter (Signed)
°  New message    Spouse calling to report frequent nose bleeds.  Please call

## 2019-10-26 NOTE — Telephone Encounter (Signed)
Pt aware of recommendations and will try a little Vasoline. Pt declines lab work at this time. Pt will call back if continues to have nose bleeds./cy

## 2019-10-27 DIAGNOSIS — L89151 Pressure ulcer of sacral region, stage 1: Secondary | ICD-10-CM | POA: Diagnosis not present

## 2019-10-27 DIAGNOSIS — I2109 ST elevation (STEMI) myocardial infarction involving other coronary artery of anterior wall: Secondary | ICD-10-CM | POA: Diagnosis not present

## 2019-10-27 DIAGNOSIS — T82855D Stenosis of coronary artery stent, subsequent encounter: Secondary | ICD-10-CM | POA: Diagnosis not present

## 2019-10-27 DIAGNOSIS — S20409D Unspecified superficial injuries of unspecified back wall of thorax, subsequent encounter: Secondary | ICD-10-CM | POA: Diagnosis not present

## 2019-10-27 DIAGNOSIS — I2510 Atherosclerotic heart disease of native coronary artery without angina pectoris: Secondary | ICD-10-CM | POA: Diagnosis not present

## 2019-10-27 DIAGNOSIS — E538 Deficiency of other specified B group vitamins: Secondary | ICD-10-CM | POA: Diagnosis not present

## 2019-11-01 DIAGNOSIS — I2109 ST elevation (STEMI) myocardial infarction involving other coronary artery of anterior wall: Secondary | ICD-10-CM | POA: Diagnosis not present

## 2019-11-01 DIAGNOSIS — I2510 Atherosclerotic heart disease of native coronary artery without angina pectoris: Secondary | ICD-10-CM | POA: Diagnosis not present

## 2019-11-01 DIAGNOSIS — S20409D Unspecified superficial injuries of unspecified back wall of thorax, subsequent encounter: Secondary | ICD-10-CM | POA: Diagnosis not present

## 2019-11-01 DIAGNOSIS — T82855D Stenosis of coronary artery stent, subsequent encounter: Secondary | ICD-10-CM | POA: Diagnosis not present

## 2019-11-01 DIAGNOSIS — L89151 Pressure ulcer of sacral region, stage 1: Secondary | ICD-10-CM | POA: Diagnosis not present

## 2019-11-01 DIAGNOSIS — E538 Deficiency of other specified B group vitamins: Secondary | ICD-10-CM | POA: Diagnosis not present

## 2019-11-02 ENCOUNTER — Telehealth: Payer: Self-pay | Admitting: Adult Health

## 2019-11-02 DIAGNOSIS — I2510 Atherosclerotic heart disease of native coronary artery without angina pectoris: Secondary | ICD-10-CM | POA: Diagnosis not present

## 2019-11-02 DIAGNOSIS — I2109 ST elevation (STEMI) myocardial infarction involving other coronary artery of anterior wall: Secondary | ICD-10-CM | POA: Diagnosis not present

## 2019-11-02 DIAGNOSIS — L89151 Pressure ulcer of sacral region, stage 1: Secondary | ICD-10-CM | POA: Diagnosis not present

## 2019-11-02 DIAGNOSIS — T82855D Stenosis of coronary artery stent, subsequent encounter: Secondary | ICD-10-CM | POA: Diagnosis not present

## 2019-11-02 DIAGNOSIS — E538 Deficiency of other specified B group vitamins: Secondary | ICD-10-CM | POA: Diagnosis not present

## 2019-11-02 DIAGNOSIS — S20409D Unspecified superficial injuries of unspecified back wall of thorax, subsequent encounter: Secondary | ICD-10-CM | POA: Diagnosis not present

## 2019-11-02 NOTE — Telephone Encounter (Signed)
Home Health Verbal Orders - Caller/Agency: Elie Confer Number: [phone] Requesting OT/PT/Skilled Nursing/Social Work/Speech Therapy:   Pt had a fall yesterday, slight abrasion on left elbow. Pt reports no pain, no swelling, and balance is still good. Langley Gauss wants to consider continuing PT just to make sure Balance is good.

## 2019-11-03 DIAGNOSIS — E538 Deficiency of other specified B group vitamins: Secondary | ICD-10-CM | POA: Diagnosis not present

## 2019-11-03 DIAGNOSIS — T82855D Stenosis of coronary artery stent, subsequent encounter: Secondary | ICD-10-CM | POA: Diagnosis not present

## 2019-11-03 DIAGNOSIS — I2510 Atherosclerotic heart disease of native coronary artery without angina pectoris: Secondary | ICD-10-CM | POA: Diagnosis not present

## 2019-11-03 DIAGNOSIS — I2109 ST elevation (STEMI) myocardial infarction involving other coronary artery of anterior wall: Secondary | ICD-10-CM | POA: Diagnosis not present

## 2019-11-03 DIAGNOSIS — S20409D Unspecified superficial injuries of unspecified back wall of thorax, subsequent encounter: Secondary | ICD-10-CM | POA: Diagnosis not present

## 2019-11-03 DIAGNOSIS — L89151 Pressure ulcer of sacral region, stage 1: Secondary | ICD-10-CM | POA: Diagnosis not present

## 2019-11-04 NOTE — Telephone Encounter (Signed)
Ok for verbal orders to continue PT 

## 2019-11-04 NOTE — Telephone Encounter (Signed)
Spoke to Col[place] and advised to continue PT.  Nothing further needed.

## 2019-11-08 ENCOUNTER — Telehealth: Payer: Self-pay | Admitting: Cardiology

## 2019-11-08 NOTE — Telephone Encounter (Signed)
Patient's wife would like Dr. Ellyn Hack or Miguel Rota to give her a call in regards to her husbands medication. She is unaware of which medication is causing her husband to be so tired. Claims he eats breakfast then goes back to bed and states that he is still in bed right now. He has yet to get up to even use the bathroom. She is very upset and asking for someone to call before lunch or she will keep calling until she speaks with someone.

## 2019-11-08 NOTE — Telephone Encounter (Signed)
Attempted to contact patient wife to discuss concerns. No answer, no voicemail- will try again later.

## 2019-11-09 DIAGNOSIS — I2109 ST elevation (STEMI) myocardial infarction involving other coronary artery of anterior wall: Secondary | ICD-10-CM | POA: Diagnosis not present

## 2019-11-09 DIAGNOSIS — R32 Unspecified urinary incontinence: Secondary | ICD-10-CM | POA: Diagnosis not present

## 2019-11-09 DIAGNOSIS — T82855D Stenosis of coronary artery stent, subsequent encounter: Secondary | ICD-10-CM | POA: Diagnosis not present

## 2019-11-09 DIAGNOSIS — Z9181 History of falling: Secondary | ICD-10-CM | POA: Diagnosis not present

## 2019-11-09 DIAGNOSIS — N179 Acute kidney failure, unspecified: Secondary | ICD-10-CM | POA: Diagnosis not present

## 2019-11-09 DIAGNOSIS — I080 Rheumatic disorders of both mitral and aortic valves: Secondary | ICD-10-CM | POA: Diagnosis not present

## 2019-11-09 DIAGNOSIS — S20409D Unspecified superficial injuries of unspecified back wall of thorax, subsequent encounter: Secondary | ICD-10-CM | POA: Diagnosis not present

## 2019-11-09 DIAGNOSIS — H547 Unspecified visual loss: Secondary | ICD-10-CM | POA: Diagnosis not present

## 2019-11-09 DIAGNOSIS — I2510 Atherosclerotic heart disease of native coronary artery without angina pectoris: Secondary | ICD-10-CM | POA: Diagnosis not present

## 2019-11-09 DIAGNOSIS — D631 Anemia in chronic kidney disease: Secondary | ICD-10-CM | POA: Diagnosis not present

## 2019-11-09 DIAGNOSIS — D539 Nutritional anemia, unspecified: Secondary | ICD-10-CM | POA: Diagnosis not present

## 2019-11-09 DIAGNOSIS — E611 Iron deficiency: Secondary | ICD-10-CM | POA: Diagnosis not present

## 2019-11-09 DIAGNOSIS — F0280 Dementia in other diseases classified elsewhere without behavioral disturbance: Secondary | ICD-10-CM | POA: Diagnosis not present

## 2019-11-09 DIAGNOSIS — Z7902 Long term (current) use of antithrombotics/antiplatelets: Secondary | ICD-10-CM | POA: Diagnosis not present

## 2019-11-09 DIAGNOSIS — M8580 Other specified disorders of bone density and structure, unspecified site: Secondary | ICD-10-CM | POA: Diagnosis not present

## 2019-11-09 DIAGNOSIS — N189 Chronic kidney disease, unspecified: Secondary | ICD-10-CM | POA: Diagnosis not present

## 2019-11-09 DIAGNOSIS — L89151 Pressure ulcer of sacral region, stage 1: Secondary | ICD-10-CM | POA: Diagnosis not present

## 2019-11-09 DIAGNOSIS — E538 Deficiency of other specified B group vitamins: Secondary | ICD-10-CM | POA: Diagnosis not present

## 2019-11-09 NOTE — Telephone Encounter (Signed)
Spoke with pt wife, she reports the patient is fine today and is out in the yard getting up leaves. She feels like he may have had a bug yesterday.

## 2019-11-23 ENCOUNTER — Telehealth: Payer: Self-pay | Admitting: Adult Health

## 2019-11-23 NOTE — Telephone Encounter (Signed)
Denise from [facility] called to let us know that patient has stopped taking all of his BP medications and his Palvix for the past week. She states patient is aware that he has appt tomorrow with Jory Sims, and he is planning on coming to his appt.  If any questions Langley Gauss can be [phone].

## 2019-11-23 NOTE — Telephone Encounter (Signed)
Thank you :)

## 2019-11-23 NOTE — Progress Notes (Signed)
Cardiology Office Note   Date:  11/24/2019   ID:  [facility], DOB 03/27/1930, MRN [id number]  PCP:  Dorothyann Peng, NP  Cardiologist: Dr. Ellyn Hack No chief complaint on file.    History of Present Illness: Nicholas Porter is a 85 y.o. male who presents for for ongoing assessment and management of coronary artery disease with STEMI of the anterior wall and October 2020.  Emergent cardiac catheterization dated 10/01/2019 required PTCA to the first diagonal and PCI to the right coronary artery.  Because of chronic kidney disease ACE and ARB was not instituted.  Echocardiogram during hospitalization revealed an EF of 45% to 50%.  Other history includes hypertension, deconditioning with physical therapy, chronic dyspnea on exertion in the setting of COPD.  The patient complained of profound fatigue and dyspnea on exertion on last office visit.  I did check a CBC to evaluate for anemia.  CBC completed on 10/08/2019 revealed a hemoglobin of 10.0 and hematocrit of 30.1.  Since being seen last, the patient has stopped taking all of his blood pressure medications and Plavix for the last week.  This was reported by by 8 of visiting nurse service.  There is no indication of why he decided to stop the medication.  He is angry and adamantly refuses to take the mediations.  His wife is with him and they are arguing about this.   Past Medical History:  Diagnosis Date  . Acute metabolic encephalopathy [id number]  . Acute renal failure superimposed on stage 4 chronic kidney disease ([place]) 11/27/2015  . Anemia of chronic renal failure, stage 4 (severe) ([place]) 11/27/2015  . Anxiety   . Arthritis    back  . 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 CAD S/P percutaneous coronary angioplasty - DES in RCA with PTCA for ISR; Moderate mLAD, 80% ostial OM2 stable 05/22/2008   Qualifier: Diagnosis of  By: Linda Hedges MD, Heinz Knuckles   . CKD  (chronic kidney disease) stage 3, GFR 30-59 ml/min   . COPD (chronic obstructive pulmonary disease) ([place])   . Dementia ([place]) 05/13/2019  . Depression   . Dyspnea    with activity   . Essential hypertension 06/30/2007   Qualifier: Diagnosis of  By: Cori Razor RN, Mikal Plane Eye abnormalities    injections in eyes 12/2017  . H/O Inferior STEMI (09/2013) emergent PCI with Promus DES to RCA (3.0 mm  x 28 mm - 3.2 mm) 10/05/2013  . Hypothyroidism   . Klebsiella sepsis ([place]) 04/02/2017  . Left-sided carotid artery disease -- s/p CEA 04/06/2006   S/p Left CEA   . Mitral regurgitation - onexam 09/03/2016    Past Surgical History:  Procedure Laterality Date  . AMPUTATION     left index finger -tramatic  . BILIARY STENT PLACEMENT N/A 01/06/2019   Procedure: BILIARY STENT PLACEMENT;  Surgeon: Clarene Essex, MD;  Location: [facility];  Service: Endoscopy;  Laterality: N/A;  . CARDIAC CATHETERIZATION  November 2011   40% mid LAD, 50% proximal RCA, 30-40% distal RCA (DOMINANT RCA with wraparound PDA & 2 large PLBs), 60-70% ostial OM1. No change from 2001. Medical therapy  . CAROTID ENDARTERECTOMY Left 2007   Dr.Hayes: 08/2016 Dopplers: Stable less than 40% right internal carotid stenosis and stable moderate 40-60% left internal carotid stenosis. Patent vertebral and subclavian arteries.  . CARPAL TUNNEL RELEASE    . CHOLECYSTECTOMY N/A 01/02/2019   Procedure: LAPAROSCOPIC  CHOLECYSTECTOMY;  Surgeon: Michael Boston, MD;  Location: [facility];  Service: General;  Laterality: N/A;  . CORNEAL TRANSPLANT    . CORONARY ANGIOPLASTY WITH STENT PLACEMENT  09/2013; 05/2014    d) Promus DES 3.0 mm x 28 mm (3.2 mm)  to RCA with STEMI; residual 70% mid-distal LAD, OM 270-80%. Medical management; b) PTCA of 75 % ISR , LAD lesion noted as ~40%  . CORONARY STENT INTERVENTION N/A 10/08/2019   Procedure: CORONARY STENT INTERVENTION;  Surgeon: Wellington Hampshire, MD;  Location: [place] [facility];  Service: Cardiovascular;   Laterality: N/A;  . CORONARY/GRAFT ACUTE MI REVASCULARIZATION N/A 10/01/2019   Procedure: Coronary/Graft Acute MI Revascularization;  Surgeon: Burnell Blanks, MD;  Location: [place] [facility];  Service: Cardiovascular;  Laterality: N/A;  . ERCP N/A 01/06/2019   Procedure: ENDOSCOPIC RETROGRADE CHOLANGIOPANCREATOGRAPHY (ERCP);  Surgeon: Clarene Essex, MD;  Location: [facility];  Service: Endoscopy;  Laterality: N/A;  . ERCP N/A 05/28/2019   Procedure: ENDOSCOPIC RETROGRADE CHOLANGIOPANCREATOGRAPHY (ERCP);  Surgeon: Clarene Essex, MD;  Location: [facility];  Service: Endoscopy;  Laterality: N/A;  . FOOT SURGERY     left  . HERNIA REPAIR    . LEFT HEART CATH AND CORONARY ANGIOGRAPHY N/A 10/01/2019   Procedure: LEFT HEART CATH AND CORONARY ANGIOGRAPHY;  Surgeon: Burnell Blanks, MD;  Location: [place] [facility];  Service: Cardiovascular;  Laterality: N/A;  . LEFT HEART CATHETERIZATION WITH CORONARY ANGIOGRAM N/A 10/05/2013   Procedure: LEFT HEART CATHETERIZATION WITH CORONARY ANGIOGRAM;  Surgeon: Peter M Martinique, MD;  Location[facility] CATH LAB;  Service: Cardiovascular;  RCA 100% - PCI,CxOM stable, LAD ~70% mid;  . LEFT HEART CATHETERIZATION WITH CORONARY ANGIOGRAM N/A 06/08/2014   Procedure: LEFT HEART CATHETERIZATION WITH CORONARY ANGIOGRAM;  Surgeon: Troy Sine, MD;  Location: [facility] CATH LAB;  Service: Cardiovascular;  UA 6/'15: 75% ISR in RCA - PTCA, LAD lesion ~40%, CxOM stable.    Marland Kitchen NM MYOVIEW LTD  03/2017    - Elevatred Troponin in setting of Sepsis -- DEMAND ISCHEMIA.  Normal perfusion. LVEF 51% with normal wall motion. This is a low risk study.  Marland Kitchen PARTIAL GASTRECTOMY    . PERCUTANEOUS CORONARY INTERVENTION-BALLOON ONLY  06/08/2014   Procedure: PERCUTANEOUS CORONARY INTERVENTION-BALLOON ONLY;  Surgeon: Troy Sine, MD;  Location: [facility] CATH LAB;  Service: Cardiovascular;;  . REMOVAL OF STONES  05/28/2019   Procedure: REMOVAL OF STONES;  Surgeon: Clarene Essex, MD;  Location: [place];  Service: Endoscopy;;  . ROTATOR CUFF REPAIR     left  . SHOULDER ARTHROSCOPY WITH ROTATOR CUFF REPAIR AND SUBACROMIAL DECOMPRESSION Right 07/01/2013   Procedure: RIGHT SHOULDER ARTHROSCOPY WITH  SUBACROMIAL DECOMPRESSION/DISTAL CLAVICLE RESECTION/ROTATOR CUFF REPAIR ;  Surgeon: Marin Shutter, MD;  Location: [place];  Service: Orthopedics;  Laterality: Right;  . SPHINCTEROTOMY  01/06/2019   Procedure: SPHINCTEROTOMY;  Surgeon: Clarene Essex, MD;  Location: [facility];  Service: Endoscopy;;  . SPINE SURGERY  1985   cervical laminectomy  . STENT REMOVAL  05/28/2019   Procedure: STENT REMOVAL;  Surgeon: Clarene Essex, MD;  Location: [place];  Service: Endoscopy;;  . TOTAL KNEE ARTHROPLASTY    . TRANSTHORACIC ECHOCARDIOGRAM  09/'17; 4/'18; 1/'20   a) Normal EF 55-60%. Mild to moderate AS with mod AI. Mild MR.;; b) Mild global reduction in LV systolic function: [id number]; grade 1 DD - high LVEDP. Mild AS/AI/TR. Mild-Mod MR; moderately elevated PAP.;; c) TTE 12/2018: EF 55-60%. No RWMA. Gr1 DD. Mod AS with  mild AI. Severe LA dilation.  Marland Kitchen VAGOTOMY     partial gastrectomy     Current Outpatient Medications  Medication Sig Dispense Refill  . donepezil (ARICEPT) 10 MG tablet TAKE 1 TABLET(10 MG) BY MOUTH AT BEDTIME 30 tablet 3  . latanoprost (XALATAN) 0.005 % ophthalmic solution 1 drop at bedtime.    . vitamin B-12 (CYANOCOBALAMIN) 100 MCG tablet Take 100 mcg by mouth daily.    . carvedilol (COREG) 3.125 MG tablet Take 1 tablet (3.125 mg total) by mouth 2 (two) times daily with a meal. 60 tablet 6  . clopidogrel (PLAVIX) 75 MG tablet Take 1 tablet (75 mg total) by mouth daily. (Patient not taking: Reported on 11/24/2019) 30 tablet 11  . feeding supplement, ENSURE ENLIVE, (ENSURE ENLIVE) LIQD Take 237 mLs by mouth 3 (three) times daily between meals. (Patient not taking: Reported on 11/24/2019) 237 mL 12  . hydrALAZINE (APRESOLINE) 25 MG tablet Take 1 tablet (25 mg total) by mouth 3 (three) times  daily. (Patient not taking: Reported on 11/24/2019) 30 tablet 6  . isosorbide mononitrate (IMDUR) 60 MG 24 hr tablet TAKE 1 TABLET(60 MG) BY MOUTH DAILY (Patient not taking: Reported on 11/24/2019) 30 tablet 6  . levothyroxine (SYNTHROID) 25 MCG tablet Take 1 tablet (25 mcg total) by mouth daily before breakfast. (Patient not taking: Reported on 11/24/2019) 90 tablet 1  . nitroGLYCERIN (NITROSTAT) 0.4 MG SL tablet DISSOLVE 1 TABLET UNDER THE TONGUE EVERY 5 MINUTES AS NEEDED FOR CHEST PAIN (Patient not taking: Reported on 11/24/2019) 25 tablet 12  . rosuvastatin (CRESTOR) 5 MG tablet Take 1 tablet (5 mg total) by mouth daily at 6 PM. (Patient not taking: Reported on 11/24/2019) 30 tablet 11   No current facility-administered medications for this visit.     Allergies:   Amlodipine besylate, Lipitor [atorvastatin], Naproxen, Oxycodone-acetaminophen, and Isosorbide    Social History:  The patient  reports that he quit smoking about 54 years ago. His smoking use included cigarettes. He quit after 15.00 years of use. He has never used smokeless tobacco. He reports that he does not drink alcohol or use drugs.   Family History:  The patient's family history includes Diabetes in his sister; Heart disease in his sister.    ROS: All other systems are reviewed and negative. Unless otherwise mentioned in H&P    PHYSICAL EXAM: VS:  BP (!) 148/68   Pulse 67   Ht 5\' 7"  (1.702 m)   Wt 94 lb 6.4 oz (42.8 kg)   SpO2 99%   BMI 14.79 kg/m  , BMI Body mass index is 14.79 kg/m. GEN: Well nourished, well developed, in no acute distress Neuro:  Strength and sensation are intact Psych: euthymic mood, full affect,arugmentatitive.    EKG:Not completed this office visit.   Recent Labs: 01/03/2019: Magnesium 1.8 10/01/2019: ALT 17; TSH 5.576 10/08/2019: BUN 63; Creatinine, Ser 2.34; Hemoglobin 10.0; Platelets 97; Potassium 3.9; Sodium 144    Lipid Panel    Component Value Date/Time   CHOL 147 10/01/2019 0409    TRIG 114 10/01/2019 0409   TRIG 91 10/07/2006 1055   HDL 60 10/01/2019 0409   CHOLHDL 2.5 10/01/2019 0409   VLDL 23 10/01/2019 0409   LDLCALC 64 10/01/2019 0409      Wt Readings from Last 3 Encounters:  11/24/19 94 lb 6.4 oz (42.8 kg)  10/18/19 103 lb 9.6 oz (47 kg)  10/08/19 111 lb 8.8 oz (50.6 kg)      Other studies Reviewed:  CORONARY STENT INTERVENTION: 10/08/2019  Prox RCA to Mid RCA lesion is 20% stenosed.  Mid RCA lesion is 95% stenosed.  Mid RCA to Dist RCA lesion is 40% stenosed.  Post intervention, there is a 0% residual stenosis.  A drug-eluting stent was successfully placed using a STENT RESOLUTE ONYX 3.0X18.  1. Moderately elevated left ventricular end-diastolic pressure at 22 mmHg. 2. Successful direct stenting of the mid right coronary artery. The stent overlapped with the previously placed stent. Ostial RCA spasm improved with intracoronary nitroglycerin.  Recommendations: Continue treatment with Plavix without aspirin given recent bleeding issues. Only 25 mL of contrast was used. We will gently hydrate for few hours. Intervention     CARDIAC CATH: 10/01/2019  Prox RCA to Mid RCA lesion is 20% stenosed.  Mid RCA lesion is 95% stenosed.  Mid RCA to Dist RCA lesion is 40% stenosed.  Prox Cx lesion is 50% stenosed.  1st Diag lesion is 99% stenosed.  Prox LAD lesion is 50% stenosed.  Post intervention, there is a 20% residual stenosis.  Balloon angioplasty was performed using a BALLOON SAPPHIRE 2.0X12.  1. Acute anterolateral STEMI secondary to thrombotic occlusion of the small to moderate caliber first Diagonal branch 2. Successful PTCA with balloon angioplasty of the Diagonal branch. No stent was placed given the relatively small size of the vessel. Flow was established down the Diagonal with balloon angioplasty only. 3. Moderate mid LAD stenosis at the takeoff of the Diagonal branch which does not appear to be flow limiting 4.  Moderate mid proximal to mid Circumflex stenosis which does not appear to be flow limiting.  5. The RCA is a large dominant vessel with a patent mid stented segment with mild stent restenosis. There is a severe stenosis in the mid RCA just beyond the old stented segment.   Recommendations: Will reload Brilinta 180 mg po x 1 in the ICU as the patient had an episode of emesis prior to leaving the cath lab. Will continue Aggrastat drip for 18 hours given thrombotic burden. Will continue ASA/Brilinta for 12 months. Consider starting a beta blocker later today pending his BP. He has apparently had intolerance to statins in the past (itching noted in record). Will explore this and consider another statin prior to discharge. I would anticipate that we will gently hydrate over the next 24 hours and if renal function remains stable, will plan staged PCI of the RCA next week.  Diagnostic Dominance: Right   Intervention     ECHO: 10/01/2019 1. Left ventricular ejection fraction, by visual estimation, is 45 to 50%. The left ventricle has mildly decreased function. Normal left ventricular size. There is no left ventricular hypertrophy. 2. Apical septal segment and apex are abnormal. 3. Left ventricular diastolic Doppler parameters are consistent with impaired relaxation pattern of LV diastolic filling. 4. Global right ventricle has normal systolic function.The right ventricular size is normal. No increase in right ventricular wall thickness. 5. Left atrial size was normal. 6. Right atrial size was normal. 7. Moderate mitral annular calcification. 8. The mitral valve is abnormal. Trace mitral valve regurgitation. Mild mitral stenosis. 9. The tricuspid valve is normal in structure. Tricuspid valve regurgitation was not visualized by color flow Doppler. 10. Aortic valve mean gradient measures 19.3 mmHg. 11. Aortic valve peak gradient measures 33.8 mmHg. 12. The aortic valve is bicuspid Aortic  valve regurgitation is mild by color flow Doppler. Moderate aortic valve stenosis. 13. There is Moderate thickening of the aortic valve. 14. There is Severe  calcifcation of the aortic valve. 15. The pulmonic valve was normal in structure. Pulmonic valve regurgitation is not visualized by color flow Doppler. 16. Normal pulmonary artery systolic pressure. 17. The inferior vena cava is normal in size with greater than 50% respiratory variability, suggesting right atrial pressure of 3 mmHg.  FINDINGS Left Ventricle: Left ventricular ejection fraction, by visual estimation, is 45 to 50%. The left ventricle has mildly decreased function. There is no left ventricular hypertrophy. Normal left ventricular size. Spectral Doppler shows Left ventricular  diastolic Doppler parameters are consistent with impaired relaxation pattern of LV diastolic filling.   ASSESSMENT AND PLAN:  1.CAD: History of MI October 2020 with PTCA to the first diagonal and PCI of the right coronary artery.  The patient is adamant about not taking his medications anymore.  He and his wife argue about it, he states that he feels badly on the medications, and does not feel like they are doing any good.  I tried to explain to him the reasons behind medications for cardioprotective properties, and prevention of of a future event.  He becomes more angry.  He and his wife continue to argue.  I have explained to him that I could not force him to take his medications, but these are our recommendations.  If he chooses not to take his medications as directed he is taking full responsibility for any future events which may be caused by medical noncompliance.  He verbalizes understanding, but continues to argue with me about what medications he is willing to take in which he is not.  I have decreased his carvedilol to 3.125 mg twice daily as he states he feels more tired on the higher dose.  I doubt that this will convince him to take his  medications as directed as he is so determined about his opinion on whether or not he needs to take them.  2.  Hypertension: Blood pressure is slightly elevated today.  Decreased dose of carvedilol as above.  3.  Hypercholesterolemia: Recommended that he continue to take rosuvastatin 5 mg daily.  He has chosen not to do so.  4. Medical non-compliance:  I have explained to him the risks of nonobservance to medical regimine. He is aware that this is AMA.  .   Current medicines are reviewed at length with the patient today.    Labs/ tests ordered today include: None Phill Myron. [place] Pugh, ANP, AACC   11/24/2019 2:12 PM    [facility] [place] [address] Office [phone] Fax [phone]  Notice: This dictation was prepared with Dragon dictation along with smaller phrase technology. Any transcriptional errors that result from this process are unintentional and may not be corrected upon review.

## 2019-11-23 NOTE — Telephone Encounter (Signed)
will route to NP that will be seeing patient tomorrow.

## 2019-11-24 ENCOUNTER — Other Ambulatory Visit: Payer: Self-pay

## 2019-11-24 ENCOUNTER — Ambulatory Visit (INDEPENDENT_AMBULATORY_CARE_PROVIDER_SITE_OTHER): Payer: Medicare Other | Admitting: Adult Health

## 2019-11-24 ENCOUNTER — Telehealth: Payer: Self-pay | Admitting: Adult Health

## 2019-11-24 ENCOUNTER — Encounter: Payer: Self-pay | Admitting: Adult Health

## 2019-11-24 VITALS — BP 148/68 | HR 67 | Ht 67.0 in | Wt 94.4 lb

## 2019-11-24 DIAGNOSIS — Z9119 Patient's noncompliance with other medical treatment and regimen: Secondary | ICD-10-CM | POA: Diagnosis not present

## 2019-11-24 DIAGNOSIS — I2510 Atherosclerotic heart disease of native coronary artery without angina pectoris: Secondary | ICD-10-CM

## 2019-11-24 DIAGNOSIS — I6522 Occlusion and stenosis of left carotid artery: Secondary | ICD-10-CM | POA: Diagnosis not present

## 2019-11-24 DIAGNOSIS — I10 Essential (primary) hypertension: Secondary | ICD-10-CM

## 2019-11-24 DIAGNOSIS — E7800 Pure hypercholesterolemia, unspecified: Secondary | ICD-10-CM | POA: Diagnosis not present

## 2019-11-24 DIAGNOSIS — Z91199 Patient's noncompliance with other medical treatment and regimen due to unspecified reason: Secondary | ICD-10-CM

## 2019-11-24 DIAGNOSIS — Z23 Encounter for immunization: Secondary | ICD-10-CM

## 2019-11-24 DIAGNOSIS — Z9861 Coronary angioplasty status: Secondary | ICD-10-CM | POA: Diagnosis not present

## 2019-11-24 MED ORDER — CARVEDILOL 3.125 MG PO TABS: 3.1250 mg | ORAL_TABLET | Freq: Two times a day (BID) | ORAL | 6 refills | Status: DC

## 2019-11-24 NOTE — Telephone Encounter (Signed)
Spoke to [place] and advise that she proceed with orders.  Nothing further needed.

## 2019-11-24 NOTE — Telephone Encounter (Signed)
See note

## 2019-11-24 NOTE — Telephone Encounter (Signed)
Home Health Verbal Orders - Caller/Agency: Elie Confer Number: [phone] Requesting OT/PT/Skilled Nursing/Social Work/Speech Therapy: Requesting Social Work Evaluation

## 2019-11-24 NOTE — Patient Instructions (Signed)
Medication Instructions:  DECREASE- Carvedilol 3.125 mg by mouth twice a day  *If you need a refill on your cardiac medications before your next appointment, please call your pharmacy*  Lab Work: None Ordered  Testing/Procedures: None Ordered  Follow-Up: At [facility], you and your health needs are our priority.  As part of our continuing mission to provide you with exceptional heart care, we have created designated Provider Care Teams.  These Care Teams include your primary Cardiologist (physician) and Advanced Practice Providers (APPs -  Physician Assistants and Nurse Practitioners) who all work together to provide you with the care you need, when you need it.  Your next appointment:   3 month(s)  The format for your next appointment:   In Person  Provider:   Glenetta Hew, MD  Other Instructions

## 2019-11-24 NOTE — Telephone Encounter (Signed)
Ok for verbal orders ?

## 2019-12-16 ENCOUNTER — Other Ambulatory Visit: Payer: Self-pay | Admitting: Adult Health

## 2019-12-16 NOTE — Telephone Encounter (Signed)
DENIED.  FILLED ON 10/18/2019 BY A DIFFERENT PROVIDER.

## 2019-12-28 ENCOUNTER — Telehealth: Payer: Self-pay | Admitting: Cardiology

## 2019-12-28 NOTE — Telephone Encounter (Signed)
Forwarding to [place] --? If ok to crush Carvedilol. Should make it easier. Glenetta Hew, MD  OTW - OK to just stop.  Glenetta Hew, MD

## 2019-12-28 NOTE — Telephone Encounter (Signed)
I spoke with pt and his wife. They report pt only takes his medications once a day after dinner.  Coreg and hydralazine are ordered more than once daily but wife reports pt only takes these once daily. Pt reports difficulty swallowing Coreg.  It gets stuck in his throat and causes burning. Able to take the other medications without problems. No burning any other time during the day.  Wife reports pt has history of reflux but it has not bothered him for years.  Used to be on medication for reflux but no longer taking.  Will forward to Dr Ellyn Hack for review/recommendations.

## 2019-12-28 NOTE — Telephone Encounter (Signed)
Pt c/o medication issue:  1. Name of Medication: carvedilol (COREG) 3.125 MG tablet  2. How are you currently taking this medication (dosage and times per day)? As directed  3. Are you having a reaction (difficulty breathing--STAT)? no  4. What is your medication issue? According to wife, patient states that for the past month this pill burns his throat every time he swallows it. Would like to know if this is a common issue.

## 2019-12-30 NOTE — Telephone Encounter (Signed)
Spoke with patient.  He notes all other medications go down without issue, but when he takes the carvedilol it gets stuck in his throat, then burns.  Not sure why he has trouble with such a small tablet.    Advised that he can crush the tablet and mix with small amount of water or Ensure.  Patient agreeable to trying this method.

## 2020-01-11 ENCOUNTER — Telehealth: Payer: Self-pay | Admitting: Cardiology

## 2020-01-11 ENCOUNTER — Ambulatory Visit: Payer: Medicare Other | Admitting: Cardiology

## 2020-01-11 ENCOUNTER — Telehealth: Payer: Self-pay | Admitting: Adult Health

## 2020-01-11 ENCOUNTER — Ambulatory Visit: Payer: Self-pay | Admitting: Adult Health

## 2020-01-11 NOTE — Telephone Encounter (Signed)
Please f/u with patient as they are declining to go to ED

## 2020-01-11 NOTE — Telephone Encounter (Signed)
He should stop Plavix until bleeding stops   Nicholas Porter

## 2020-01-11 NOTE — Telephone Encounter (Signed)
If he lost that much blood and is having frequent nose bleeds and is symptomatic then needs to atleast go to urgent care but ideally ED

## 2020-01-11 NOTE — Telephone Encounter (Signed)
Pt family member called in stating pt is having serious nose bleed that requires several rolls of toilet paper to clean up and refuses to go to ER or urgent care. Pt family member would like [place] to speak to pt advising pt to go to ER

## 2020-01-11 NOTE — Telephone Encounter (Signed)
He does need to continue with blood thinner. He can get some Afrin at the pharmacy and spray a couple sprays up his nose and hopefully this helps to keep it from bleeding again

## 2020-01-11 NOTE — Telephone Encounter (Signed)
Spoke with wife and patient has been having bleeding per wife he feels is coming from his stomach. Wife stated he has been having bleeding from his mouth and nose passing clots at times, off and on since midnight. He is feeling lightheaded Patient has had to have is nose cauterized one time before. Advised patient to reach out to PCP but if bleeding returns UC/ED

## 2020-01-11 NOTE — Telephone Encounter (Signed)
Pt's wife and daughter calling. Reports pt had "Nosebleed" at midnight and 0420. States severe, "Used 3 entire rolls of toilet paper. States lasted 2 hours. Reports "A lot" ofclots and "Strings."  States epistaxis again  at 0420 "Worse, bleeding faster." Has been stopped for 30 minutes. States stopped "With cold washcloth."  Reports pt dizzy, holding on to things.  Also reports pt stated it was coming from his stomach. States "He said he can feel it coming up from his stomach." Denies abdominal pain but states "Sore." Pt with dementia, on Plavix.  Directed to ED, declines. Pt yelling in background. Reiterated need for ED, angry affect. Wife states he absolutely will not go. Call placed to practice to alert of refusal; will route to practice for review. Daughter states if pt hears form [name], he may go to ED.   CB# [phone]  Reason for Disposition . Dizziness or lightheadedness  Answer Assessment - Initial Assessment Questions 1. AMOUNT OF BLEEDING: "How bad is the bleeding?" "How much blood was lost?" "Has the bleeding stopped?"   - MILD: needed a couple tissues   - MODERATE: needed many tissues   - SEVERE: large blood clots, soaked many tissues, lasted more than 30 minutes      Severe x 2 2. ONSET: "When did the nosebleed start?"      0420 3. FREQUENCY: "How many nosebleeds have you had in the last 24 hours?"      2 4. RECURRENT SYMPTOMS: "Have there been other recent nosebleeds?" If so, ask: "How long did it take you to stop the bleeding?" "What worked best?"      "Lasted 2 hours" 5. CAUSE: "What do you think caused this nosebleed?"      6. LOCAL FACTORS: "Do you have any cold symptoms?", "Have you been rubbing or picking at your nose?"    no 7. SYSTEMIC FACTORS: "Do you have high blood pressure or any bleeding problems?"    no 8. BLOOD THINNERS: "Do you take any blood thinners?" (e.g., coumadin, heparin, aspirin, Plavix) Plavix    9. OTHER SYMPTOMS: "Do you have any other symptoms?"  (e.g., lightheadedness)    dizzy 10. PREGNANCY: "Is there any chance you are pregnant?" "When was your last menstrual period?"       *No Answer*  Protocols used: [id number]

## 2020-01-11 NOTE — Telephone Encounter (Signed)
Message  STAT if patient feels like he/she is going to faint   1) Are you dizzy now? No, patient states he is lightheaded.  2) Do you feel faint or have you passed out? No  3) Do you have any other symptoms? Nose bleeds with clots, fluctuating BP/HR, headaches,    4) Have you checked your HR and BP (record if available)? 160/75 HR 75

## 2020-01-11 NOTE — Telephone Encounter (Signed)
Spoke to pt spouse and advise of update. Spouse verbalized understanding.

## 2020-01-11 NOTE — Telephone Encounter (Signed)
Pt spouse wants to know if pt needs to be off of blood thinners right now. Please advise

## 2020-01-12 NOTE — Telephone Encounter (Signed)
Advised patient wife, verbalized understanding.

## 2020-02-21 ENCOUNTER — Other Ambulatory Visit: Payer: Self-pay

## 2020-02-21 MED ORDER — DONEPEZIL HCL 10 MG PO TABS: ORAL_TABLET | ORAL | 3 refills | Status: DC

## 2020-02-23 ENCOUNTER — Ambulatory Visit (INDEPENDENT_AMBULATORY_CARE_PROVIDER_SITE_OTHER): Payer: Medicare Other | Admitting: Cardiology

## 2020-02-23 ENCOUNTER — Other Ambulatory Visit: Payer: Self-pay

## 2020-02-23 ENCOUNTER — Encounter: Payer: Self-pay | Admitting: Cardiology

## 2020-02-23 VITALS — BP 92/50 | HR 66 | Ht 67.0 in | Wt 98.2 lb

## 2020-02-23 DIAGNOSIS — Z7189 Other specified counseling: Secondary | ICD-10-CM | POA: Diagnosis not present

## 2020-02-23 DIAGNOSIS — Z9861 Coronary angioplasty status: Secondary | ICD-10-CM

## 2020-02-23 DIAGNOSIS — I2119 ST elevation (STEMI) myocardial infarction involving other coronary artery of inferior wall: Secondary | ICD-10-CM

## 2020-02-23 DIAGNOSIS — I951 Orthostatic hypotension: Secondary | ICD-10-CM

## 2020-02-23 DIAGNOSIS — E43 Unspecified severe protein-calorie malnutrition: Secondary | ICD-10-CM

## 2020-02-23 DIAGNOSIS — Z9114 Patient's other noncompliance with medication regimen: Secondary | ICD-10-CM

## 2020-02-23 DIAGNOSIS — I350 Nonrheumatic aortic (valve) stenosis: Secondary | ICD-10-CM | POA: Diagnosis not present

## 2020-02-23 DIAGNOSIS — N184 Chronic kidney disease, stage 4 (severe): Secondary | ICD-10-CM

## 2020-02-23 DIAGNOSIS — Z91148 Patient's other noncompliance with medication regimen for other reason: Secondary | ICD-10-CM

## 2020-02-23 DIAGNOSIS — F0281 Dementia in other diseases classified elsewhere with behavioral disturbance: Secondary | ICD-10-CM | POA: Diagnosis not present

## 2020-02-23 DIAGNOSIS — I2109 ST elevation (STEMI) myocardial infarction involving other coronary artery of anterior wall: Secondary | ICD-10-CM | POA: Diagnosis not present

## 2020-02-23 DIAGNOSIS — F02818 Dementia in other diseases classified elsewhere, unspecified severity, with other behavioral disturbance: Secondary | ICD-10-CM

## 2020-02-23 DIAGNOSIS — I10 Essential (primary) hypertension: Secondary | ICD-10-CM

## 2020-02-23 DIAGNOSIS — I2510 Atherosclerotic heart disease of native coronary artery without angina pectoris: Secondary | ICD-10-CM

## 2020-02-23 DIAGNOSIS — D631 Anemia in chronic kidney disease: Secondary | ICD-10-CM

## 2020-02-23 MED ORDER — CLOPIDOGREL BISULFATE 75 MG PO TABS: 75.0000 mg | ORAL_TABLET | Freq: Every day | ORAL | 3 refills | Status: AC

## 2020-02-23 NOTE — Patient Instructions (Addendum)
Medication Instructions:   RESTART TAKING CLOPIDOGREL 75 MG ONE TABLET DAILY. IF YOU HAVE  NOSE BLEED YOU MAY STOP TAKING FOR 3 DAYS  IF YO ARE STILL HAVING SOME NOSEBLEED YOU CAN STOP FOR AN ADDITIONAL 2 DAYS BUT RESTART AFTER THAT EPISODE   CHECK BLOOD PRESSURE BEFORE YOUR AFTERNOON /MIDDAY DOSE OF HYDRALAZINE - IF  SYSTOLIC BLOOD PRESSURE IS LESS THAN 110  THE DO NOT TAKE, IF SYSOTLIC BLOOD PRESSURE IS GREATER THAN 170 TAKE HYDRALAZINE. *If you need a refill on your cardiac medications before your next appointment, please call your pharmacy*   Lab Work: NOT NEEDED  Testing/Procedures: NOT NEEDED   Follow-Up: At [facility], you and your health needs are our priority.  As part of our continuing mission to provide you with exceptional heart care, we have created designated Provider Care Teams.  These Care Teams include your primary Cardiologist (physician) and Advanced Practice Providers (APPs -  Physician Assistants and Nurse Practitioners) who all work together to provide you with the care you need, when you need it.  We recommend signing up for the patient portal called "MyChart".  Sign up information is provided on this After Visit Summary.  MyChart is used to connect with patients for Virtual Visits (Telemedicine).  Patients are able to view lab/test results, encounter notes, upcoming appointments, etc.  Non-urgent messages can be sent to your provider as well.   To learn more about what you can do with MyChart, go to [email or web address].    Your next appointment:   6 month(s) SEPT 2021  The format for your next appointment:   In Person  Provider:   Glenetta Hew, MD   Other Instructions CHECK BLOOD PRESSURE BEFORE YOUR AFTERNOON /MIDDAY DOSE OF HYDRALAZINE - IF  SYSTOLIC BLOOD PRESSURE IS LESS THAN 110  THE DO NOT TAKE, IF SYSOTLIC BLOOD PRESSURE IS GREATER THAN 170 TAKE HYDRALAZINE.

## 2020-02-23 NOTE — Progress Notes (Signed)
Primary Care Provider: Dorothyann Peng, NP Cardiologist: Glenetta Hew, MD Electrophysiologist: None  Clinic Note: Chief Complaint  Patient presents with  . Follow-up    In much better spirits now.  Poor recollection of recent visits.  . Coronary Artery Disease    No further angina.  Not currently on Plavix (patient does not acknowledge that this was due to his refusal to take last visit)    HPI:    Nicholas Porter is a 86 y.o. male with a PMH notable for significant CAD most recently anterior lateral STEMI in October 2020 with PTCA only of diagonal and RCA PCI who presents today for 53-month follow-up (first follow-up visit with me since MI).  CVD And Significant Recent history:  Previous cath in 2002 2011 showed only moderate disease.  No intervention at this time.  H/O Inferior STEMI (09/2013) emergent PCI with Promus DES to RCA (3.0 mm x 28 mm - 3.2 mm)  Unstable Angina 05/2014: ISR - cutting balloon PTCA ; Ostial OM2 80% (stable) - Most recent Echo and Myoview April 2018:EF 51%. Low risk.Echo with EF 45 to 50%. GR 1 DD. Mild AI and TR. Mild to moderate MR.  03/2014: L Carotid Dz - s/p CEA   Moderate Aortic Stenosis  Acute cholelithiasis/sepsis-ERCP and biliary stent  Prolonged rehab, longstanding cardiac failure to thrive  For the last 2 years he has had significant sways in his blood pressure where he is been placed on multiple different medications and then only to have hypotension issues.  As many times I try to cut down on his medications have been added back during hospitalizations for hypertension.  10/01/2019: Anterolateral STEMI - PTCA of Diag, stage PCI of 95% RCA lesion (distal to prior stent)  Complicated by A on C RF (no ACEI/ARB 2/2 ARF & hypotension).  I last saw Nicholas Porter on July 27 noted he was very very frail and limited.  They are having lots of issues with financial security and having a lot of difficulty even obtaining groceries etc. during the  COVID-19 lockdown.  He was getting much weaker with several falls.  Is noting dyspnea walking to the bathroom or dining room.  Sleeping in a recliner.  Thankfully, his GI issues have stabilized.  He was basically showing signs of failure to thrive with borderline cachexia (severe protein malnutrition), continue weight loss.  Overall weakness, dizziness.  Very nervous and anxious starting to show signs of some dementia.  I discontinued his standing dose of hydralazine and made it only as needed for systolic pressures over Q000111Q mmHg..  We had a frank discussion about end-of-life care.  He did sign a DNR form and the plan was to potentially consider a MOST form.  At that time he indicated a desire to forego further hospitalizations.  Recent Hospitalizations:   10/01/2019 -anterolateral STEMI -> PTCA of diagonal with staged PCI of RCA 95% lesion after stent.  Complicated by acute on chronic renal failure. ---> Placed on high-dose high intensity statin.  No ACE I-ARB because of renal failure.  Imdur and hydralazine were titrated for hypertension. ? Converted to Plavix from Brilinta because of hemoglobin drop to 7.6.  (1 unit PRBC) DC'd Hgb 10.  B12. ? Palliative Care Consult: Goals of care-made DNR.  (Had already been made DNR in the outpatient setting) ? DC meds: Coreg 6.25 mg twice daily, Plavix 70 daily, hydralazine 100 3 times daily, isosorbide 60 mg daily, rosuvastatin 5 mg daily.  Nicholas Porter was seen twice  in the interim since his discharge by Jory Sims, NP  10/18/2019: Noted worsening breathing.  Doing physical therapy but not progressing well.  Noted chronic lower extremity wound.  No chest pain --> not able to lie flat.  Very frail.  Notes rate change with change in position.  Noted concerns of financial security.-In CBC hemoglobin 10.  11/24/2019: Apparently he had stopped taking all of his medications including Plavix for 2 weeks.  Apparently was angry and did not take  meds.  Decrease carvedilol to 3.125 mg twice daily, restarted Plavix.  He did not want to restart statin..  Reviewed  CV studies:    The following studies were reviewed today: (if available, images/films reviewed: From Epic Chart or Care Everywhere)  10/01/2019: Cath- D1 99% thrombotic (PTCA only-- 20%), pLAD 50%. p-mRCA Stent ISR 20%, mRCA 95%- after stent (stage PCI), m-d RCA 40%. pCx 50%.   10/08/19 - staged PCI RCA - Resolute DES 3x18 (overlaps old stent) - LVEDP 22 mmHg -> changed ot Plavix  10/01/2019 TTE : (In setting of STEMI) EF 45 to 50%.  GR 1 DD.  Normal atrial sizes.  Mild mitral stenosis..  Functionally bicuspid aortic valve with moderate AS (mean gradient 19 mmHg, peak 34 mmHg).   Interval History:   Nicholas Porter returns here today along with his wife, in much better spirits than he was in the last time I saw him, and clearly in better spirits than when he was seen by Jory Sims, NP.  He does not recall the more contentious visit at the time of his refusal to take medications etc.  He does not seem to remember that at all.  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  But as it turns out he is actually riding the lawnmower.  He does not have a lot of energy, and notes significant balance issues.  He has got some orthostatic hypotension symptoms of dizziness and wooziness when he stands up.  He feels weak, but is in much better spirits and much less down now than he was when I saw him last. He denies any symptoms of dyspnea on exertion today although he does note fatigue.  No PND, orthopnea with trivial edema. Does not at all recall (order his wife acknowledge) the fact that the abdomen refused to take medications during last visit and that he was arguing with his wife at the time.  He says he been eating a whole lot better now.  Apparently there issues with getting groceries have been resolved and they seem to be doing little better.  He is eating more and  trying to maintain his weight.  He has noted some loose stools but does not really complain of diarrhea.  No melena, hematochezia or hematuria.  He may notice some irregular heartbeats off and on but no rapid irregular heartbeats or fluttering sensations.  No syncope/near syncope.  No TIA or amaurosis fugax.  The patient does have symptoms concerning for COVID-19 infection (fever, chills, cough, or new shortness of breath).  The patient is practicing social distancing & Masking.  I did not discuss whether or not he had a COVID-19 vaccines or not.  REVIEWED OF SYSTEMS   Review of Systems  Constitutional: Positive for malaise/fatigue (Mostly notes feeling weak and somewhat easily fatigued, but not nearly the profound level of fatigue noted during his most recent follow-up.).  HENT: Negative for congestion and nosebleeds.   Gastrointestinal: Positive for diarrhea (Described more  as loose stools). Negative for abdominal pain, blood in stool, constipation and melena.  Genitourinary: Negative for hematuria.  Musculoskeletal: Positive for joint pain (Mild arthritis). Negative for falls.  Neurological: Positive for dizziness (Mostly positional) and weakness (General).  Psychiatric/Behavioral: Positive for memory loss (He exhibits memory loss although he is not acknowledging.). Negative for depression (Does not seem to be endorsing many symptoms of depression).   I have reviewed and (if needed) personally updated the patient's problem list, medications, allergies, past medical and surgical history, social and family history.   PAST MEDICAL HISTORY   Past Medical History:  Diagnosis Date  . Acute metabolic encephalopathy [id number]  . Acute renal failure superimposed on stage 4 chronic kidney disease ([place]) 11/27/2015  . Anemia of chronic renal failure, stage 4 (severe) ([place]) 11/27/2015  . Anxiety   . Arthritis    back  . 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 CAD S/P percutaneous coronary angioplasty - 09/2013   a) 09/2013 - Inf STEMI (Promus DES 3 x 28 DES mRCA); b 05/2014 - UnstAngina - 85% RCA ISR - scoring & [place] balloon PTCA; c) Anterolat STEMI - PTCA only of 99% D1 (small), staged DES (RESOLUTE ONYX DES 3 MM 18 MM) PCI of 95% mRCA lesion after initial stent - Overlapping DES  . Calcific aortic stenosis 10/01/2019   Echo shows functionally bicuspid aortic valve with calcified mild to moderate AS-mean gradient 19 mm, peak 34 mmHg.  Marland Kitchen Cholelithiasis with acute cholecystitis with biliary obstruction 05/2019   Cholecystectomy and biliary stent placed with ERCP  . CKD (chronic kidney disease) stage 3, GFR 30-59 ml/min   . COPD (chronic obstructive pulmonary disease) ([place])   . Dementia ([place]) 05/13/2019  . Depression   . Essential hypertension 06/30/2007   Qualifier: Diagnosis of  By: Cori Razor RN, Mikal Plane Eye abnormalities    injections in eyes 12/2017  . H/O Inferior STEMI (09/2013) 10/05/2013   emergent PCI with Promus DES to RCA (3.0 mm  x 28 mm - 3.2 mm)  . History of Anterolateral STEMI 10/01/2019   D1 99% thrombotic lesion-PTCA only.  Had staged PCI of m RCA 95% lesion after prior stent.  . Hypothyroidism   . Klebsiella sepsis ([place]) 04/02/2017  . Left-sided carotid artery disease -- s/p CEA 04/06/2006   S/p Left CEA     PAST SURGICAL HISTORY   Past Surgical History:  Procedure Laterality Date  . AMPUTATION     left index finger -tramatic  . BILIARY STENT PLACEMENT N/A 01/06/2019   Procedure: BILIARY STENT PLACEMENT;  Surgeon: Clarene Essex, MD;  Location: [facility];  Service: Endoscopy;  Laterality: N/A;  . CAROTID ENDARTERECTOMY Left 2007   Dr.Hayes: 08/2016 Dopplers: Stable less than 40% right internal carotid stenosis and stable moderate 40-60% left internal carotid stenosis. Patent vertebral and subclavian arteries.  . CARPAL TUNNEL RELEASE    . CHOLECYSTECTOMY N/A 01/02/2019    Procedure: LAPAROSCOPIC CHOLECYSTECTOMY;  Surgeon: Michael Boston, MD;  Location: [facility];  Service: General;  Laterality: N/A;  . CORNEAL TRANSPLANT    . CORONARY ANGIOPLASTY WITH STENT PLACEMENT  09/2013; 05/2014    d) Promus DES 3.0 mm x 28 mm (3.2 mm)  to RCA with STEMI; residual 70% mid-distal LAD, OM 270-80%. Medical management; b) PTCA of 85 % mRCA ISR (3.25 mm scoring balloon, 3.5 mm [place] balloon), LAD lesion noted as ~40%  .  CORONARY STENT INTERVENTION N/A 10/08/2019   Procedure: CORONARY STENT INTERVENTION;  Surgeon: Wellington Hampshire, MD;  Location: [facility]; ; 95% mRCA after previous stent--overlapping DES PCI: Resolute [place] 3.0 mm 18 mm  . CORONARY/GRAFT ACUTE MI REVASCULARIZATION N/A 10/01/2019   Procedure: Coronary/Graft Acute MI Revascularization;  Surgeon: Burnell Blanks, MD;  Location: [facility]; ANTEROLATERAL STEMI: PTCA only of 99% subtotal occluded D1;  . ERCP N/A 01/06/2019   Procedure: ENDOSCOPIC RETROGRADE CHOLANGIOPANCREATOGRAPHY (ERCP);  Surgeon: Clarene Essex, MD;  Location: [facility];  Service: Endoscopy;  Laterality: N/A;  . ERCP N/A 05/28/2019   Procedure: ENDOSCOPIC RETROGRADE CHOLANGIOPANCREATOGRAPHY (ERCP);  Surgeon: Clarene Essex, MD;  Location: [facility];  Service: Endoscopy;  Laterality: N/A;  . FOOT SURGERY     left  . HERNIA REPAIR    . LEFT HEART CATH AND CORONARY ANGIOGRAPHY N/A 10/01/2019   Procedure: LEFT HEART CATH AND CORONARY ANGIOGRAPHY;  Surgeon: Burnell Blanks, MD;  Location: [facility];    . LEFT HEART CATH AND CORONARY ANGIOGRAPHY  11/11/2010   Procedure: LEFT HEART CATH AND CORONARY ANGIOGRAPHY;  Surgeon: Leonie Man, MD;  Location: [facility]; 40% mid LAD, 50% proximal RCA, 30-40% distal RCA (DOMINANT RCA with wraparound PDA & 2 large PLBs), 60-70% ostial OM1. No change from 2001. Medical therapy  . LEFT HEART CATHETERIZATION WITH CORONARY ANGIOGRAM N/A 10/05/2013   Procedure: LEFT HEART  CATHETERIZATION WITH CORONARY ANGIOGRAM;  Surgeon: Peter M Martinique, MD;  Location: [facility] LAB;  Service: Cardiovascular; INFERIOR STEMI: RCA 100% - PCI,CxOM stable, LAD ~70% mid;  . LEFT HEART CATHETERIZATION WITH CORONARY ANGIOGRAM N/A 06/08/2014   Procedure: LEFT HEART CATHETERIZATION WITH CORONARY ANGIOGRAM;  Surgeon: Troy Sine, MD;  Location: [facility] CATH LAB; UA 6/'15: 75% ISR in RCA - PTCA, LAD lesion ~40%, CxOM stable.    Marland Kitchen NM MYOVIEW LTD  03/2017    - Elevatred Troponin in setting of Sepsis -- DEMAND ISCHEMIA.  Normal perfusion. LVEF 51% with normal wall motion. This is a low risk study.  Marland Kitchen PARTIAL GASTRECTOMY    . PERCUTANEOUS CORONARY INTERVENTION-BALLOON ONLY  06/08/2014   Procedure: PERCUTANEOUS CORONARY INTERVENTION-BALLOON ONLY;  Surgeon: Troy Sine, MD;  Location: [facility] CATH LAB;  Service: Cardiovascular;;  . REMOVAL OF STONES  05/28/2019   Procedure: REMOVAL OF STONES;  Surgeon: Clarene Essex, MD;  Location: [facility];  Service: Endoscopy;;  . ROTATOR CUFF REPAIR     left  . SHOULDER ARTHROSCOPY WITH ROTATOR CUFF REPAIR AND SUBACROMIAL DECOMPRESSION Right 07/01/2013   Procedure: RIGHT SHOULDER ARTHROSCOPY WITH  SUBACROMIAL DECOMPRESSION/DISTAL CLAVICLE RESECTION/ROTATOR CUFF REPAIR ;  Surgeon: Marin Shutter, MD;  Location: [facility];  Service: Orthopedics;  Laterality: Right;  . SPHINCTEROTOMY  01/06/2019   Procedure: SPHINCTEROTOMY;  Surgeon: Clarene Essex, MD;  Location: [facility];  Service: Endoscopy;;  . SPINE SURGERY  1985   cervical laminectomy  . STENT REMOVAL  05/28/2019   Procedure: STENT REMOVAL;  Surgeon: Clarene Essex, MD;  Location: [facility];  Service: Endoscopy;;  . TOTAL KNEE ARTHROPLASTY    . TRANSTHORACIC ECHOCARDIOGRAM  09/'17; 4/'18; 1/'20   a) Normal EF 55-60%. Mild to moderate AS with mod AI. Mild MR.;; b) Mild global reduction in LV systolic function: Q000111Q; grade 1 DD - high LVEDP. Mild AS/AI/TR. Mild-Mod MR; moderately elevated PAP.;; c) TTE 12/2018: EF 55-60%.  No RWMA. Gr1 DD. Mod AS with mild AI. Severe LA dilation.  . TRANSTHORACIC ECHOCARDIOGRAM  10/01/2019   ANTEROLATERAL STEMI: EF 45 to 50%.  GR 1 DD.  Normal atrial sizes.  Mild mitral stenosis..  Functionally bicuspid aortic valve with moderate AS (mean gradient 19 mmHg, peak 34 mmHg).  Marland Kitchen VAGOTOMY     partial gastrectomy   Diagnostic Dominance: Right Plan   Intervention     Intervention     MEDICATIONS/ALLERGIES   Current Meds  Medication Sig  . carvedilol (COREG) 3.125 MG tablet Take 1 tablet (3.125 mg total) by mouth 2 (two) times daily with a meal.  . donepezil (ARICEPT) 10 MG tablet TAKE 1 TABLET(10 MG) BY MOUTH AT BEDTIME  . hydrALAZINE (APRESOLINE) 25 MG tablet Take 1 tablet (25 mg total) by mouth 3 (three) times daily.  . isosorbide mononitrate (IMDUR) 60 MG 24 hr tablet TAKE 1 TABLET(60 MG) BY MOUTH DAILY  . latanoprost (XALATAN) 0.005 % ophthalmic solution 1 drop at bedtime.  Marland Kitchen levothyroxine (SYNTHROID) 25 MCG tablet Take 1 tablet (25 mcg total) by mouth daily before breakfast.  . nitroGLYCERIN (NITROSTAT) 0.4 MG SL tablet DISSOLVE 1 TABLET UNDER THE TONGUE EVERY 5 MINUTES AS NEEDED FOR CHEST PAIN  . rosuvastatin (CRESTOR) 5 MG tablet Take 1 tablet (5 mg total) by mouth daily at 6 PM.  . vitamin B-12 (CYANOCOBALAMIN) 100 MCG tablet Take 100 mcg by mouth daily.    Allergies  Allergen Reactions  . Amlodipine Besylate Hives  . Lipitor [Atorvastatin] Itching  . Naproxen Diarrhea  . Oxycodone-Acetaminophen Itching  . Isosorbide Rash    SOCIAL HISTORY/FAMILY HISTORY   Reviewed in Epic:  Pertinent findings: Seems to be doing better as far as obtaining his groceries. **As far as I can tell, remains DNR.  OBJCTIVE -PE, EKG, labs   Wt Readings from Last 3 Encounters:  02/23/20 98 lb 3.2 oz (44.5 kg)  11/24/19 94 lb 6.4 oz (42.8 kg)  10/18/19 103 lb 9.6 oz (47 kg)    Physical Exam: BP (!) 92/50   Pulse 66   Ht 5\' 7"  (1.702 m)   Wt 98 lb 3.2 oz (44.5  kg)   SpO2 99%   BMI 15.38 kg/m  Physical Exam  Constitutional: He is oriented to person, place, and time. No distress.  Thin, frail/cachectic elderly gentleman.  Mostly well-groomed, but remains somewhat unshaven.  HENT:  Head: Normocephalic.  Temporal wasting noted  Neck: No hepatojugular reflux and no JVD present. Carotid bruit is not present (Soft bilateral bruits).  Cardiovascular: Normal rate, regular rhythm, S1 normal and S2 normal.  Occasional extrasystoles are present. PMI is not displaced (Somewhat sustained, hyperdynamic related to very thin chest wall). Exam reveals decreased pulses (Palpable but diminished pedal pulses.). Exam reveals no gallop and no friction rub.  Murmur heard. High-pitched harsh crescendo-decrescendo midsystolic murmur is present with a grade of 3/6 at the upper right sternal border radiating to the neck. Pulmonary/Chest: Effort normal and breath sounds normal. No respiratory distress. He has no wheezes. He has no rales.  Abdominal: Soft. Bowel sounds are normal.  Thin scaphoid abdomen.  No HSM  Musculoskeletal:        General: No edema. Normal range of motion.     Cervical back: Normal range of motion and neck supple.  Neurological: He is alert and oriented to person, place, and time.  Psychiatric: He has a normal mood and affect. His behavior is normal.  Clearly has some issues with recall.  Vitals reviewed.  Adult ECG Report  Recent Labs: No new labs Lab Results  Component  Value Date   CHOL 147 10/01/2019   HDL 60 10/01/2019   LDLCALC 64 10/01/2019   TRIG 114 10/01/2019   CHOLHDL 2.5 10/01/2019   Lab Results  Component Value Date   CREATININE 2.34 (H) 10/08/2019   BUN 63 (H) 10/08/2019   NA 144 10/08/2019   K 3.9 10/08/2019   CL 115 (H) 10/08/2019   CO2 21 (L) 10/08/2019   Lab Results  Component Value Date   TSH 5.576 (H) 10/01/2019   CBC Latest Ref Rng & Units 10/08/2019 10/08/2019 10/07/2019  WBC 4.0 - 10.5 K/uL 6.8 - 5.7    Hemoglobin 13.0 - 17.0 g/dL 10.0(L) 10.4(L) 7.6(L)  Hematocrit 39.0 - 52.0 % 30.1(L) 29.6(L) 22.8(L)  Platelets 150 - 400 K/uL 97(L) - 82(L)     ASSESSMENT/PLAN    Problem List Items Addressed This Visit    CAD S/P PC: DES in RCA with PTCA for ISR & recent staged overlapping DES stent for m-dRCA tenosis & PTCA of Culprit 99% D1 lesion; Moderate mLAD, 80% ostial OM2 stable - Primary (Chronic)    Since I last saw him, he had the MI with D1 PTCA and staged PCI to the RCA.  Seems like his chest discomfort and exertional dyspnea has improved notably.  He is in very good spirits now.  Major concern is that he had stopped his Plavix.  With a brand-new stent and PTCA only with pretty significant lesion in the diagonal is probably best for him to remain on Plavix now.  His hemoglobin level seem to be stable with no evidence of recurrent bleeding.  Plan: Restart Plavix 75 mg daily. Continue current low-dose carvedilol and Imdur along with statin.      H/O Inferior STEMI (09/2013) emergent PCI with Promus DES to RCA (3.0 mm  x 28 mm - 3.2 mm) (Chronic)    Initial STEMI was inferior STEMI with occluded RCA and evidence of infarct on Myoview.  He now has had a third intervention on the RCA with initial being in-stent restenosis of the original stent, now recently overlapping distal stent.  Echocardiogram did not show any significant wall motion Hilda Blades but did show a mildly reduced EF.  Does not seem to be having active angina symptoms.       ST elevation myocardial infarction (STEMI) of anterolateral wall (HCC) (Chronic)    PTCA only of diagonal branch.  Was not a very large vessel.  Not felt to be large enough for PCI with DES stent.  He is now more than 3 months out, and therefore likely have at least a bare-metal stent result.  Relatively preserved EF on echo, not much different than prior to his MI. Echo showed apical septal hypokinesis with probable related to PDA infarct from original  MI.  Overall seems recovering well with no further angina and no heart failure symptoms at this point.      Essential hypertension (Chronic)    He has very labile blood pressures.  Most of time he is relatively hypotensive and has had orthostatic hypotension issues.  I am actually more concerned about hypotension than 1 hypertension.  He is on beta-blocker at low-dose along with hydralazine and Imdur.  Not using ACE inhibitor or ARB because of his renal insufficiency.  His hydralazine dose has been bounced around a lot.  I do not really think this is necessary we need to just let his blood pressure be.    He will determine whether or not he takes his midday  dose based upon his blood pressure.  If his pressure is less than 110 mmHg he will not take the dose and wait to see about taking his nighttime dose.  If his blood pressure greater than 170 take additional 25 mg to make 50 mg dose.      Anemia of chronic renal failure, stage 4 (severe) (HCC) (Chronic)    We had stopped his Plavix back before his MI.  In retrospect perhaps that could have been in part related to making him more susceptible.  Now with new DES stent, he needs to be back on it.  We can reevaluate 1 year out and determine if he needs not longer.  Would not do aspirin.  Preferably would not stop/hold Plavix until October, unless there is evidence of bleeding or urgent procedure required.  He is now 6 months out, but has been off of it for several months.  I would like at least 3 more months of uninterrupted Plavix until June of this year before potentially holding it for procedures.      Protein-calorie malnutrition, severe (Chronic)    He seems to be doing better from a nutrition standpoint but is still lighter than when I last saw him.  Overall seems to be healthier though.  We will continue to monitor.  They seem to be doing better from a standpoint of obtaining groceries etc.      Moderate aortic stenosis by prior  echocardiogram (Chronic)    Has had several recent evaluations.  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  At 86 years old, I do not think that these valves will get bad enough to doing about prior to his passing.      Orthostatic hypotension    He still has poor balance and dizziness etc.  I am aware of his low blood pressure.  Do not want up over estimate his hypertension episodes which are less likely problematic than hypotension.  Adjusting hydralazine dosing based on blood pressure as noted      History of medication noncompliance    This was noted by Jory Sims, NP.  Apparently they had quite a contentious visit.  I did not really belabor this point, I simply informed her that he need to take his Plavix.      Dementia ([facility])    Findings is more pronounced than I previously understood.  He clearly has no recollection of conversations that took months ago.  Thankfully, today seems to be pleasantly demented.      Goals of care, counseling/discussion    Because he was essentially doing today I did not choose to go into this conversation again.  We will need to readdress goals of care beyond simply CODE STATUS. Currently he does not seem to be to the point of palliative care or hospice, but prior to his MI he certainly did seem that way.          COVID-19 Education: The signs and symptoms of COVID-19 were discussed with the patient and how to seek care for testing (follow up with PCP or arrange E-visit).   The importance of social distancing was discussed today.  I spent a total of 26 minutes with the patient. >  50% of the time was spent in direct patient consultation.  Additional time spent with chart review  / charting (studies, outside notes, etc): 16 --> I personally reviewed his cardiac cath films both initial and staged PCI  films as well as his echocardiogram.  We reviewed these pictures together in the clinic room. Total  Time: 42 min   Current medicines are reviewed at length with the patient today.  (+/- concerns) none   Patient Instructions / Medication Changes & Studies & Tests Ordered   Patient Instructions  Medication Instructions:   RESTART TAKING CLOPIDOGREL 75 MG ONE TABLET DAILY. IF YOU HAVE  NOSE BLEED YOU MAY STOP TAKING FOR 3 DAYS  IF YO ARE STILL HAVING SOME NOSEBLEED YOU CAN STOP FOR AN ADDITIONAL 2 DAYS BUT RESTART AFTER THAT EPISODE   CHECK BLOOD PRESSURE BEFORE YOUR AFTERNOON /MIDDAY DOSE OF HYDRALAZINE - IF  SYSTOLIC BLOOD PRESSURE IS LESS THAN 110  THE DO NOT TAKE, IF SYSOTLIC BLOOD PRESSURE IS GREATER THAN 170 TAKE HYDRALAZINE. *If you need a refill on your cardiac medications before your next appointment, please call your pharmacy*   Lab Work: NOT NEEDED  Testing/Procedures: NOT NEEDED   Follow-Up: At [facility], you and your health needs are our priority.  As part of our continuing mission to provide you with exceptional heart care, we have created designated Provider Care Teams.  These Care Teams include your primary Cardiologist (physician) and Advanced Practice Providers (APPs -  Physician Assistants and Nurse Practitioners) who all work together to provide you with the care you need, when you need it.  We recommend signing up for the patient portal called "MyChart".  Sign up information is provided on this After Visit Summary.  MyChart is used to connect with patients for Virtual Visits (Telemedicine).  Patients are able to view lab/test results, encounter notes, upcoming appointments, etc.  Non-urgent messages can be sent to your provider as well.   To learn more about what you can do with MyChart, go to [email or web address].    Your next appointment:   6 month(s) SEPT 2021  The format for your next appointment:   In Person  Provider:   Glenetta Hew, MD   Other Instructions CHECK BLOOD PRESSURE BEFORE YOUR AFTERNOON /MIDDAY DOSE OF HYDRALAZINE - IF  SYSTOLIC  BLOOD PRESSURE IS LESS THAN 110  THE DO NOT TAKE, IF SYSOTLIC BLOOD PRESSURE IS GREATER THAN 170 TAKE HYDRALAZINE.    Studies Ordered:   No orders of the defined types were placed in this encounter.    Glenetta Hew, M.D., M.S. Interventional Cardiologist   Pager # [phone] Phone # [phone] [address] [place], [place] [address]   Thank you for choosing Heartcare at [facility]!!

## 2020-03-01 DIAGNOSIS — I129 Hypertensive chronic kidney disease with stage 1 through stage 4 chronic kidney disease, or unspecified chronic kidney disease: Secondary | ICD-10-CM | POA: Diagnosis not present

## 2020-03-01 DIAGNOSIS — N189 Chronic kidney disease, unspecified: Secondary | ICD-10-CM | POA: Diagnosis not present

## 2020-03-01 DIAGNOSIS — I340 Nonrheumatic mitral (valve) insufficiency: Secondary | ICD-10-CM | POA: Diagnosis not present

## 2020-03-01 DIAGNOSIS — I779 Disorder of arteries and arterioles, unspecified: Secondary | ICD-10-CM | POA: Diagnosis not present

## 2020-03-01 DIAGNOSIS — I213 ST elevation (STEMI) myocardial infarction of unspecified site: Secondary | ICD-10-CM | POA: Diagnosis not present

## 2020-03-01 DIAGNOSIS — N184 Chronic kidney disease, stage 4 (severe): Secondary | ICD-10-CM | POA: Diagnosis not present

## 2020-03-01 DIAGNOSIS — N179 Acute kidney failure, unspecified: Secondary | ICD-10-CM | POA: Diagnosis not present

## 2020-03-01 DIAGNOSIS — R634 Abnormal weight loss: Secondary | ICD-10-CM | POA: Diagnosis not present

## 2020-03-01 DIAGNOSIS — N2581 Secondary hyperparathyroidism of renal origin: Secondary | ICD-10-CM | POA: Diagnosis not present

## 2020-03-02 ENCOUNTER — Encounter: Payer: Self-pay | Admitting: Cardiology

## 2020-03-03 ENCOUNTER — Encounter: Payer: Self-pay | Admitting: Cardiology

## 2020-03-03 NOTE — Assessment & Plan Note (Signed)
Since I last saw him, he had the MI with D1 PTCA and staged PCI to the RCA.  Seems like his chest discomfort and exertional dyspnea has improved notably.  He is in very good spirits now.  Major concern is that he had stopped his Plavix.  With a brand-new stent and PTCA only with pretty significant lesion in the diagonal is probably best for him to remain on Plavix now.  His hemoglobin level seem to be stable with no evidence of recurrent bleeding.  Plan: Restart Plavix 75 mg daily. Continue current low-dose carvedilol and Imdur along with statin.

## 2020-03-03 NOTE — Assessment & Plan Note (Signed)
Findings is more pronounced than I previously understood.  He clearly has no recollection of conversations that took months ago.  Thankfully, today seems to be pleasantly demented.

## 2020-03-03 NOTE — Assessment & Plan Note (Signed)
Initial STEMI was inferior STEMI with occluded RCA and evidence of infarct on Myoview.  He now has had a third intervention on the RCA with initial being in-stent restenosis of the original stent, now recently overlapping distal stent.  Echocardiogram did not show any significant wall motion Hilda Blades but did show a mildly reduced EF.  Does not seem to be having active angina symptoms.

## 2020-03-03 NOTE — Assessment & Plan Note (Signed)
He has very labile blood pressures.  Most of time he is relatively hypotensive and has had orthostatic hypotension issues.  I am actually more concerned about hypotension than 1 hypertension.  He is on beta-blocker at low-dose along with hydralazine and Imdur.  Not using ACE inhibitor or ARB because of his renal insufficiency.  His hydralazine dose has been bounced around a lot.  I do not really think this is necessary we need to just let his blood pressure be.    He will determine whether or not he takes his midday dose based upon his blood pressure.  If his pressure is less than 110 mmHg he will not take the dose and wait to see about taking his nighttime dose.  If his blood pressure greater than 170 take additional 25 mg to make 50 mg dose.

## 2020-03-03 NOTE — Assessment & Plan Note (Signed)
Has had several recent evaluations.  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  At 86 years old, I do not think that these valves will get bad enough to doing about prior to his passing.

## 2020-03-03 NOTE — Assessment & Plan Note (Signed)
He still has poor balance and dizziness etc.  I am aware of his low blood pressure.  Do not want up over estimate his hypertension episodes which are less likely problematic than hypotension.  Adjusting hydralazine dosing based on blood pressure as noted

## 2020-03-03 NOTE — Assessment & Plan Note (Signed)
He seems to be doing better from a nutrition standpoint but is still lighter than when I last saw him.  Overall seems to be healthier though.  We will continue to monitor.  They seem to be doing better from a standpoint of obtaining groceries etc.

## 2020-03-03 NOTE — Assessment & Plan Note (Signed)
PTCA only of diagonal branch.  Was not a very large vessel.  Not felt to be large enough for PCI with DES stent.  He is now more than 3 months out, and therefore likely have at least a bare-metal stent result.  Relatively preserved EF on echo, not much different than prior to his MI. Echo showed apical septal hypokinesis with probable related to PDA infarct from original MI.  Overall seems recovering well with no further angina and no heart failure symptoms at this point.

## 2020-03-03 NOTE — Assessment & Plan Note (Signed)
Because he was essentially doing today I did not choose to go into this conversation again.  We will need to readdress goals of care beyond simply CODE STATUS. Currently he does not seem to be to the point of palliative care or hospice, but prior to his MI he certainly did seem that way.

## 2020-03-03 NOTE — Assessment & Plan Note (Signed)
We had stopped his Plavix back before his MI.  In retrospect perhaps that could have been in part related to making him more susceptible.  Now with new DES stent, he needs to be back on it.  We can reevaluate 1 year out and determine if he needs not longer.  Would not do aspirin.  Preferably would not stop/hold Plavix until October, unless there is evidence of bleeding or urgent procedure required.  He is now 6 months out, but has been off of it for several months.  I would like at least 3 more months of uninterrupted Plavix until June of this year before potentially holding it for procedures.

## 2020-03-03 NOTE — Assessment & Plan Note (Signed)
This was noted by Jory Sims, NP.  Apparently they had quite a contentious visit.  I did not really belabor this point, I simply informed her that he need to take his Plavix.

## 2020-03-05 DIAGNOSIS — Z23 Encounter for immunization: Secondary | ICD-10-CM | POA: Diagnosis not present

## 2020-03-09 NOTE — Discharge Instructions (Signed)

## 2020-03-10 ENCOUNTER — Inpatient Hospital Stay (HOSPITAL_COMMUNITY)
Admission: RE | Admit: 2020-03-10 | Discharge: 2020-03-10 | Disposition: A | Discharge status: Home / Self Care | Payer: Medicare Other | Source: Ambulatory Visit | Attending: Nephrology | Admitting: Nephrology

## 2020-03-10 ENCOUNTER — Telehealth: Payer: Self-pay | Admitting: Cardiology

## 2020-03-10 ENCOUNTER — Encounter (HOSPITAL_COMMUNITY): Payer: Self-pay

## 2020-03-10 NOTE — Telephone Encounter (Signed)
Per call from pt's wife she  Stated patient has stopped taking them, she would like to speak to someone about this please.

## 2020-03-10 NOTE — Telephone Encounter (Signed)
Left message to call back  

## 2020-03-15 NOTE — Telephone Encounter (Signed)
Returned the call to the patient and the patient's wife. He stated that he is refusing to take any of his medications. He stated that it was affecting his self esteem and since he has stopped them he feels much better. He has been off of all medications for three weeks now.  He had a stent placed in 09/2019 and was currently Plavix. He has been educated on the importance of taking his medications and the repercussions of what could happen if he does not. He has still refused.  His wife stated that she has tried to convince him to take them but he will not.  She was asked if he could check his blood pressure at home and to keep a log of these. She stated that a home nurse does come out periodically to check on him.

## 2020-03-16 ENCOUNTER — Telehealth: Payer: Self-pay | Admitting: Cardiology

## 2020-03-16 NOTE — Telephone Encounter (Signed)
Returned call to Julie-states patients wife called her this morning to let her know patient is not taking his medication and has not for the last 2 weeks.  Patient stated if he didn't have to take so many all the time he would be willing to take them. She explained importance of them to patient.   She is wondering if there is any medications we could discontinue or decrease frequency of in order to get patient to take his medication.    Advised wife called yesterday and message has been sent to MD to review.   She request call back as well because she helps with his medications.   Advised would make primary RN aware.

## 2020-03-16 NOTE — Telephone Encounter (Signed)
New message  Per Care connection would like to discuss if this patient can be taken of some of his medications. Please call.

## 2020-03-17 NOTE — Telephone Encounter (Signed)
Spoke with patient and wife.  Patient agreed  To take clopidogrel 75 mg daily until April 30 , would prefer to continue taking after this date. but it is up to patient. Informed wife it is up to patient if he takes medication .   Wife states she will place clopidogrel  For patient to take medication.  patient agreed.

## 2020-03-17 NOTE — Telephone Encounter (Signed)
When I saw him in clinic he adamantly admitted that he was taking his medicines, but he told Jory Sims, DNP that he was not taking any medicines.  To be totally honest, if he takes Plavix through April, he is more than 6 months out from his stent and is probably okay.  That is the only medicine that I think is probably reasonable for him to stay on the other ones if he does not take them, that is fine, important thing here is his quality of life.  -Would like for him to take the Plavix through the month of April.  We cannot force him to take any medicines, and I would like to have any argument with.  He is 89.   Glenetta Hew, MD

## 2020-03-23 NOTE — Telephone Encounter (Signed)
Almyra Free RN called back .  Care connection is apart of  palliative care   informed Almyra Free of conversation with patient - patient agreed  To take Plavix until the end of April  2021. He stated he was not taking any other medication.  Dr Ellyn Hack was aware and aware it was patient's choice.

## 2020-03-23 NOTE — Telephone Encounter (Signed)
Led left message to call back

## 2020-03-24 ENCOUNTER — Encounter (HOSPITAL_COMMUNITY): Payer: Medicare Other

## 2020-03-28 ENCOUNTER — Telehealth: Payer: Self-pay | Admitting: Adult Health

## 2020-03-28 NOTE — Progress Notes (Signed)
  Chronic Care Management   Note  03/28/2020 Name: Aldon Hengst MRN: [id number] DOB: 11-16-1930  Chris Cripps is a 86 y.o. year old male who is a primary care patient of Dorothyann Peng, NP. I reached out to [name] by phone today in response to a referral sent by Mr. Orlin Hilding PCP, Dorothyann Peng, NP.   Mr. Lebeda was given information about Chronic Care Management services today including:  1. CCM service includes personalized support from designated clinical staff supervised by his physician, including individualized plan of care and coordination with other care providers 2. 24/7 contact phone numbers for assistance for urgent and routine care needs. 3. Service will only be billed when office clinical staff spend 20 minutes or more in a month to coordinate care. 4. Only one practitioner may furnish and bill the service in a calendar month. 5. The patient may stop CCM services at any time (effective at the end of the month) by phone call to the office staff.   Patient agreed to services and verbal consent obtained.   Follow up plan:   Raynicia Dukes [employer] Scheduler

## 2020-04-14 ENCOUNTER — Other Ambulatory Visit: Payer: Self-pay | Admitting: Adult Health

## 2020-04-14 DIAGNOSIS — Z9861 Coronary angioplasty status: Secondary | ICD-10-CM

## 2020-04-14 DIAGNOSIS — I2510 Atherosclerotic heart disease of native coronary artery without angina pectoris: Secondary | ICD-10-CM

## 2020-04-14 DIAGNOSIS — F02818 Dementia in other diseases classified elsewhere, unspecified severity, with other behavioral disturbance: Secondary | ICD-10-CM

## 2020-04-14 DIAGNOSIS — N184 Chronic kidney disease, stage 4 (severe): Secondary | ICD-10-CM

## 2020-04-14 DIAGNOSIS — F0281 Dementia in other diseases classified elsewhere with behavioral disturbance: Secondary | ICD-10-CM

## 2020-04-18 ENCOUNTER — Ambulatory Visit: Payer: Medicare Other

## 2020-04-18 ENCOUNTER — Other Ambulatory Visit: Payer: Self-pay

## 2020-04-18 DIAGNOSIS — I10 Essential (primary) hypertension: Secondary | ICD-10-CM

## 2020-04-18 DIAGNOSIS — E785 Hyperlipidemia, unspecified: Secondary | ICD-10-CM

## 2020-04-18 DIAGNOSIS — I2510 Atherosclerotic heart disease of native coronary artery without angina pectoris: Secondary | ICD-10-CM

## 2020-04-18 DIAGNOSIS — E039 Hypothyroidism, unspecified: Secondary | ICD-10-CM

## 2020-04-18 NOTE — Chronic Care Management (AMB) (Signed)
Chronic Care Management Pharmacy  Name: Nicholas Porter  MRN: [id number] DOB: [birth year]  Initial Questions: 1. Have you seen any other providers since your last visit? NA 2. Any changes in your medicines or health? No   Chief Complaint/ HPI Nicholas Porter,  86 y.o. , male presents for their Initial CCM visit with the clinical pharmacist via telephone due to COVID-19 Pandemic.  PCP : Dorothyann Peng, NP  Their chronic conditions include: HTN, CAD s/p STEMI, HLD, Dementia,CKD stage3,  Hypothyroidism  Office Visits: 07/07/2019- Patient presented for office visit with Dorothyann Peng, NP for chief compliant of concern of infection on left hand from dog scratch. Patient prescribed doxycycline 100mg , 1 capsule twice daily. Patient to follow up if wound does not heal or worsens.   Consult Visit: 02/23/2020- Cardiology- Patient presented to office visit with Dr. Glenetta Hew, MD for 3 month follow up. This is first follow up visit since STEMI in October 2020. Patient to restart Plavix 75mg  daily and continue carvedilol, Imdur, and statin. Patient to follow up in 6 months.   11/24/2019- Cardiology- Patient presented for office visit with Jory Sims, NP for follow up. Medical non-compliance was addressed at visit. Dose of carvedilol was decreased.   Medications: Outpatient Encounter Medications as of 04/18/2020  Medication Sig  . clopidogrel (PLAVIX) 75 MG tablet Take 1 tablet (75 mg total) by mouth daily.  . carvedilol (COREG) 3.125 MG tablet Take 1 tablet (3.125 mg total) by mouth 2 (two) times daily with a meal. (Patient not taking: Reported on 04/18/2020)  . donepezil (ARICEPT) 10 MG tablet TAKE 1 TABLET(10 MG) BY MOUTH AT BEDTIME (Patient not taking: Reported on 04/18/2020)  . hydrALAZINE (APRESOLINE) 25 MG tablet Take 1 tablet (25 mg total) by mouth 3 (three) times daily. (Patient not taking: Reported on 04/18/2020)  . isosorbide mononitrate (IMDUR) 60 MG 24 hr tablet TAKE 1 TABLET(60 MG)  BY MOUTH DAILY (Patient not taking: Reported on 04/18/2020)  . latanoprost (XALATAN) 0.005 % ophthalmic solution 1 drop at bedtime.  Marland Kitchen levothyroxine (SYNTHROID) 25 MCG tablet Take 1 tablet (25 mcg total) by mouth daily before breakfast. (Patient not taking: Reported on 04/18/2020)  . nitroGLYCERIN (NITROSTAT) 0.4 MG SL tablet DISSOLVE 1 TABLET UNDER THE TONGUE EVERY 5 MINUTES AS NEEDED FOR CHEST PAIN (Patient not taking: Reported on 04/18/2020)  . rosuvastatin (CRESTOR) 5 MG tablet Take 1 tablet (5 mg total) by mouth daily at 6 PM. (Patient not taking: Reported on 04/18/2020)  . vitamin B-12 (CYANOCOBALAMIN) 100 MCG tablet Take 100 mcg by mouth daily.   No facility-administered encounter medications on file as of 04/18/2020.     Current Diagnosis/Assessment:  Goals Addressed            This Visit's Progress   . Pharmacy Care Plan       CARE PLAN ENTRY  Current Barriers:  . Chronic Disease Management support, education, and care coordination needs related to Hypertension, Hyperlipidemia, Coronary Artery Disease, Chronic Kidney Disease, Hypothyroidism, and Dementia    Hypertension . Pharmacist Clinical Goal(s): o Over the next 90 days, patient will work with PharmD and providers for blood pressure reassessment.  . Current regimen:  o Currently not taking any medications  . Interventions: o Discussed reason for medications and taking lower doses.  . Patient self care activities - Over the next 90 days, patient will: o Continue future follow up appointments with cardiology for reassessment.   Hyperlipidemia . Pharmacist Clinical Goal(s): o Over the next 90 days, patient will  work with PharmD and providers and assess motivation to take medications.  . Current regimen:  o Currently not taking any medications. . Interventions: o Discussed reason for medication . Patient self care activities - Over the next 90 days, patient will: o Continue future follow up appointments with cardiology  for reassessment.   Coronary artery disease / STEMI . Pharmacist Clinical Goal(s) o Over the next 7 days, patient continue to take clopidogrel until end of April.  . Current regimen:  o Clopidogrel 75mg , 1 tablet once daily  . Patient self care activities - Over the next week, patient will: o Complete course of clopidogrel.   Hypothyroidism . Pharmacist Clinical Goal(s) o Over the next 90 days, patient will work with PharmD and providers to assess motivation to take medications.  . Current regimen:  o Currently not taking any medications. . Interventions: o Discussed reason for medication . Patient self care activities o Continue future follow up appointments for reassessment.  Medication management . Pharmacist Clinical Goal(s): o Over the next 90 days, patient will work with PharmD and providers to achieve optimal medication adherence . Current pharmacy: [facility] . Interventions o Comprehensive medication review performed. . Patient self care activities - Over the next 90 days, patient will: o Report any questions or concerns to PharmD and/or provider(s)  Initial goal documentation       SDOH Interventions     Most Recent Value  SDOH Interventions  SDOH Interventions for the Following Domains  Financial Strain  Financial Strain Interventions  Other (Comment) [Reaching out to Education officer, museum who was in contact with patient previously]       Hypertension   Office blood pressures are  BP Readings from Last 3 Encounters:  02/23/20 (!) 92/50  11/24/19 (!) 148/68  10/18/19 (!) 154/56   Patient has failed these meds in the past: lisinopril (CKD) Carvedilol 3.125mg  and Hydralazine 25mg  (low BP)  Patient checks BP at home does not check at home   Patient is not taking any medications  We discussed   BP monitoring: Spouse reported a nurse with [facility] goes to patient's home and checks BP,weight every 2 weeks. Next visit planned for May 7-8th.    Adherence:  per spouse, patient states he feels better without medications. Patient reported makes him sleepy. If he does not take meds, he does not have energy. Per patient, ok per cards.   Plan Managed by Dr. Glenetta Hew (cards)  Hyperlipidemia   Lipid Panel     Component Value Date/Time   CHOL 147 10/01/2019 0409   TRIG 114 10/01/2019 0409   TRIG 91 10/07/2006 1055   HDL 60 10/01/2019 0409   CHOLHDL 2.5 10/01/2019 0409   VLDL 23 10/01/2019 0409   LDLCALC 64 10/01/2019 0409    The ASCVD Risk score (Goff DC Jr., et al., 2013) failed to calculate for the following reasons:   The 2013 ASCVD risk score is only valid for ages 26 to 57   The patient has a prior MI or stroke diagnosis   Patient has failed these meds in past: simvastatin   Patient is currently not on any medication per patient preference  Was previously prescribed:  Isosorbide mononitrate (Imdur) 60mg , 1 tablet once daily  Nitroglycerin 0.4mg , 1 tablet under tongue every 5 minutes as needed for chest pain  Rosuvastatin 5mg , 1 tablet once daily a 6 pm.   Plan Managed by Dr. Glenetta Hew (cards)  CAD/ STEMI   Patient is currently on the  following medications:  Clopidogrel 75mg , 1 tablet once daily   Plan Managed by Dr. Glenetta Hew (cards).  Continue current medications   Hypothyroidism   TSH  Date Value Ref Range Status  10/01/2019 5.576 (H) 0.350 - 4.500 uIU/mL Final    Comment:    Performed by a 3rd Generation assay with a functional sensitivity of <=0.01 uIU/mL. Performed at [facility], [place] [address] [place], [place] [address]     Patient is currently not on medications:   Previously on:  Levothyroxine 73mcg, 1 tablet once daily before breakfast   Plan  Patient prefers to continue without medications.   Dementia   Patient is currently not on the medications:   Previously on:   Donepezil (Aricept) 10mg , 1 tablet at bedtime   Plan Patient prefers to continue without  medications.   CKD, Stage 4   Lab Results  Component Value Date   CREATININE 2.34 (H) 10/08/2019   CREATININE 2.32 (H) 10/07/2019   CREATININE 2.53 (H) 10/06/2019   CrCl cannot be calculated (Patient's most recent lab result is older than the maximum 21 days allowed.).  Est GFR: 24 (10/08/2019)   Plan Kidney function stable.   Medication Management  Primary pharmacy: [facility]  Adherence: no gaps in refill history (per medication dispense report from 10/21/2019 to 04/18/2020)  Spouse stated patient has thrown all medications in trash (except clopidogrel). Spouse states received call from Dr. Ellyn Hack since patient was not wanting to continue medications. Was told take Plavix until the end of April.   Patient expressed concern of being limited in income.  - will be reaching out to social worker who was in contact with patient previously.   Follow-up Anson Crofts, PharmD Clinical Pharmacist [facility] [phone]

## 2020-04-21 NOTE — Patient Instructions (Addendum)
Visit Information  Goals Addressed            This Visit's Progress   . Pharmacy Care Plan       CARE PLAN ENTRY  Current Barriers:  . Chronic Disease Management support, education, and care coordination needs related to Hypertension, Hyperlipidemia, Coronary Artery Disease, Chronic Kidney Disease, Hypothyroidism, and Dementia    Hypertension . Pharmacist Clinical Goal(s): o Over the next 90 days, patient will work with PharmD and providers for blood pressure reassessment.  . Current regimen:  o Currently not taking any medications  . Interventions: o Discussed reason for medications and taking lower doses.  . Patient self care activities - Over the next 90 days, patient will: o Continue future follow up appointments with cardiology for reassessment.   Hyperlipidemia . Pharmacist Clinical Goal(s): o Over the next 90 days, patient will work with PharmD and providers and assess motivation to take medications.  . Current regimen:  o Currently not taking any medications. . Interventions: o Discussed reason for medication . Patient self care activities - Over the next 90 days, patient will: o Continue future follow up appointments with cardiology for reassessment.   Coronary artery disease / STEMI . Pharmacist Clinical Goal(s) o Over the next 7 days, patient continue to take clopidogrel until end of April.  . Current regimen:  o Clopidogrel 75mg , 1 tablet once daily  . Patient self care activities - Over the next week, patient will: o Complete course of clopidogrel.   Hypothyroidism . Pharmacist Clinical Goal(s) o Over the next 90 days, patient will work with PharmD and providers to assess motivation to take medications.  . Current regimen:  o Currently not taking any medications. . Interventions: o Discussed reason for medication . Patient self care activities o Continue future follow up appointments for reassessment.  Medication management . Pharmacist Clinical  Goal(s): o Over the next 90 days, patient will work with PharmD and providers to achieve optimal medication adherence . Current pharmacy: [facility] . Interventions o Comprehensive medication review performed. . Patient self care activities - Over the next 90 days, patient will: o Report any questions or concerns to PharmD and/or provider(s)  Initial goal documentation        Mr. Geathers was given information about Chronic Care Management services today including:  1. CCM service includes personalized support from designated clinical staff supervised by his physician, including individualized plan of care and coordination with other care providers 2. 24/7 contact phone numbers for assistance for urgent and routine care needs. 3. Standard insurance, coinsurance, copays and deductibles apply for chronic care management only during months in which we provide at least 20 minutes of these services. Most insurances cover these services at 100%, however patients may be responsible for any copay, coinsurance and/or deductible if applicable. This service may help you avoid the need for more expensive face-to-face services. 4. Only one practitioner may furnish and bill the service in a calendar month. 5. The patient may stop CCM services at any time (effective at the end of the month) by phone call to the office staff.  Patient agreed to services and verbal consent obtained.   The patient verbalized understanding of instructions provided today and agreed to receive a mailed copy of patient instruction and/or educational materials. The pharmacy team will reach out to the patient again over the next 90 days.    Anson Crofts, PharmD Clinical Pharmacist [facility] [phone]    Basics of  Medicine Management Taking your medicines correctly is an important part of managing or preventing medical problems. Make sure you know what disease or condition your medicine is  treating, and how and when to take it. If you do not take your medicine correctly, it may not work well and may cause unpleasant side effects, including serious health problems. What should I do when I am taking medicines?   Read all the labels and inserts that come with your medicines. Review the information often.  Talk with your pharmacist if you get a refill and notice a change in the size, color, or shape of your medicines.  Know the potential side effects for each medicine that you take.  Try to get all your medicines from the same pharmacy. The pharmacist will have all your information and will understand how your medicines will affect each other (interact).  Tell your health care provider about all your medicines, including over-the-counter medicines, vitamins, and herbal or dietary supplements. He or she will make sure that nothing will interact with any of your prescribed medicines. How can I take my medicines safely?  Take medicines only as told by your health care provider. ? Do not take more of your medicine than instructed. ? Do not take anyone else's medicines. ? Do not share your medicines with others. ? Do not stop taking your medicines unless your health care provider tells you to do so. ? You may need to avoid alcohol or certain foods or liquids when taking certain medicines. Follow your health care provider's instructions.  Do not split, mash, or chew your medicines unless your health care provider tells you to do so. Tell your health care provider if you have trouble swallowing your medicines.  For liquid medicine, use the dosing container that was provided. How should I organize my medicines?  Know your medicines  Know what each of your medicines looks like. This includes size, color, and shape. Tell your health care provider if you are having trouble recognizing all the medicines that you are taking.  If you cannot tell your medicines apart because they look  similar, keep them in original bottles.  If you cannot read the labels on the bottles, tell your pharmacist to put your medicines in containers with large print.  Review your medicines and your schedule with family members, a friend, or a caregiver. Use a pill organizer  Use a tool to organize your medicine schedule. Tools include a weekly pillbox, a written chart, a notebook, or a calendar.  Your tool should help you remember the following things about each medicine: ? The name of the medicine. ? The amount (dose) to take. ? The schedule. This is the day and time the medicine should be taken. ? The appearance. This includes color, shape, size, and stamp. ? How to take your medicines. This includes instructions to take them with food, without food, with fluids, or with other medicines.  Create reminders for taking your medicines. Use sticky notes, or alarms on your watch, mobile device, or phone calendar.  You may choose to use a more advanced management system. These systems have storage, alarms, and visual and audio prompts.  Some medicines can be taken on an "as-needed" basis. These include medicines for nausea or pain. If you take an as-needed medicine, write down the name and dose, as well as the date and time that you took it. How should I plan for travel?  Take your pillbox, medicines, and organization system with you  when traveling.  Have your medicines refilled before you travel. This will ensure that you do not run out of your medicines while you are away from home.  Always carry an updated list of your medicines with you. If there is an emergency, a first responder can quickly see what medicines you are taking.  Do not pack your medicines in checked luggage in case your luggage is lost or delayed.  If any of your medicines is considered a controlled substance, make sure you bring a letter from your health care provider with you. How should I store and discard my  medicines? For safe storage:  Store medicines in a cool, dry area away from light, or as directed by your health care provider. Do not store medicines in the bathroom. Heat and humidity will affect them.  Do not store your medicines with other chemicals, or with medicines for pets or other household members.  Keep medicines away from children and pets. Do not leave them on counters or bedside tables. Store them in high cabinets or on high shelves. For safe disposal:  Check expiration dates regularly. Do not take expired medicines. Discard medicines that are older than the expiration date.  Learn a safe way to dispose of your medicines. You may: ? Use a local government, hospital, or pharmacy medicine-take-back program. ? Mix the medicines with inedible substances, put them in a sealed bag or empty container, and throw them in the trash. What should I remember?  Tell your health care provider if you: ? Experience side effects. ? Have new symptoms. ? Have other concerns about taking your medicines.  Review your medicines regularly with your health care provider. Other medicines, diet, medical conditions, weight changes, and daily habits can all affect how medicines work. Ask if you need to continue taking each medicine, and discuss how well each one is working.  Refill your medicines early to avoid running out of them.  In case of an accidental overdose, call your local [place] at [phone] or visit your local emergency department immediately. This is important. Summary  Taking your medicines correctly is an important part of managing or preventing medical problems.  You need to make sure that you understand what you are taking a medicine for, as well as how and when you need to take it.  Know your medicines and use a pill organizer to help you take your medicines correctly.  In case of an accidental overdose, call your local [place] at [phone]  or visit your local emergency department immediately. This is important. This information is not intended to replace advice given to you by your health care provider. Make sure you discuss any questions you have with your health care provider. Document Revised: 12/04/2017 Document Reviewed: 12/04/2017 Elsevier Patient Education  2020 [employer].

## 2020-05-09 ENCOUNTER — Telehealth: Payer: Self-pay | Admitting: Adult Health

## 2020-05-09 NOTE — Telephone Encounter (Signed)
Pt's spouse, Imer Foxworth, stated that he received his COVID vaccine at [place] off of [address]. He received the [place] and [place] on 03/05/20. She wanted to make the PCP aware.

## 2020-05-09 NOTE — Telephone Encounter (Signed)
Documented

## 2020-08-11 ENCOUNTER — Telehealth: Payer: Self-pay | Admitting: Adult Health

## 2020-08-11 DIAGNOSIS — R29818 Other symptoms and signs involving the nervous system: Secondary | ICD-10-CM

## 2020-08-11 DIAGNOSIS — F0281 Dementia in other diseases classified elsewhere with behavioral disturbance: Secondary | ICD-10-CM

## 2020-08-11 DIAGNOSIS — R531 Weakness: Secondary | ICD-10-CM

## 2020-08-11 DIAGNOSIS — F02818 Dementia in other diseases classified elsewhere, unspecified severity, with other behavioral disturbance: Secondary | ICD-10-CM

## 2020-08-11 NOTE — Telephone Encounter (Signed)
error 

## 2020-08-11 NOTE — Telephone Encounter (Signed)
Almyra Free from [facility] called to see if the pt could get an order for a  Rolling walker with a seat  Please call Almyra Free at [phone]  Please advise

## 2020-08-11 NOTE — Telephone Encounter (Signed)
Ok for order?  

## 2020-08-11 NOTE — Telephone Encounter (Signed)
Almyra Free called back and said she doesn't have a fax number. The provider usually has a referral place of choice to send a prescription in for supplies.  Please advise

## 2020-08-11 NOTE — Telephone Encounter (Signed)
Okay to order?

## 2020-08-11 NOTE — Telephone Encounter (Signed)
Left message on machine for Nicholas Porter for a fax number.

## 2020-08-12 IMAGING — DX DG ABDOMEN 1V
1 series · 1 of 1 positions shown · non-contrast
Comparison: 01/08/2019

CLINICAL DATA: Left lower quadrant pain

EXAM:
ABDOMEN - 1 VIEW

[abdomen supine ap]
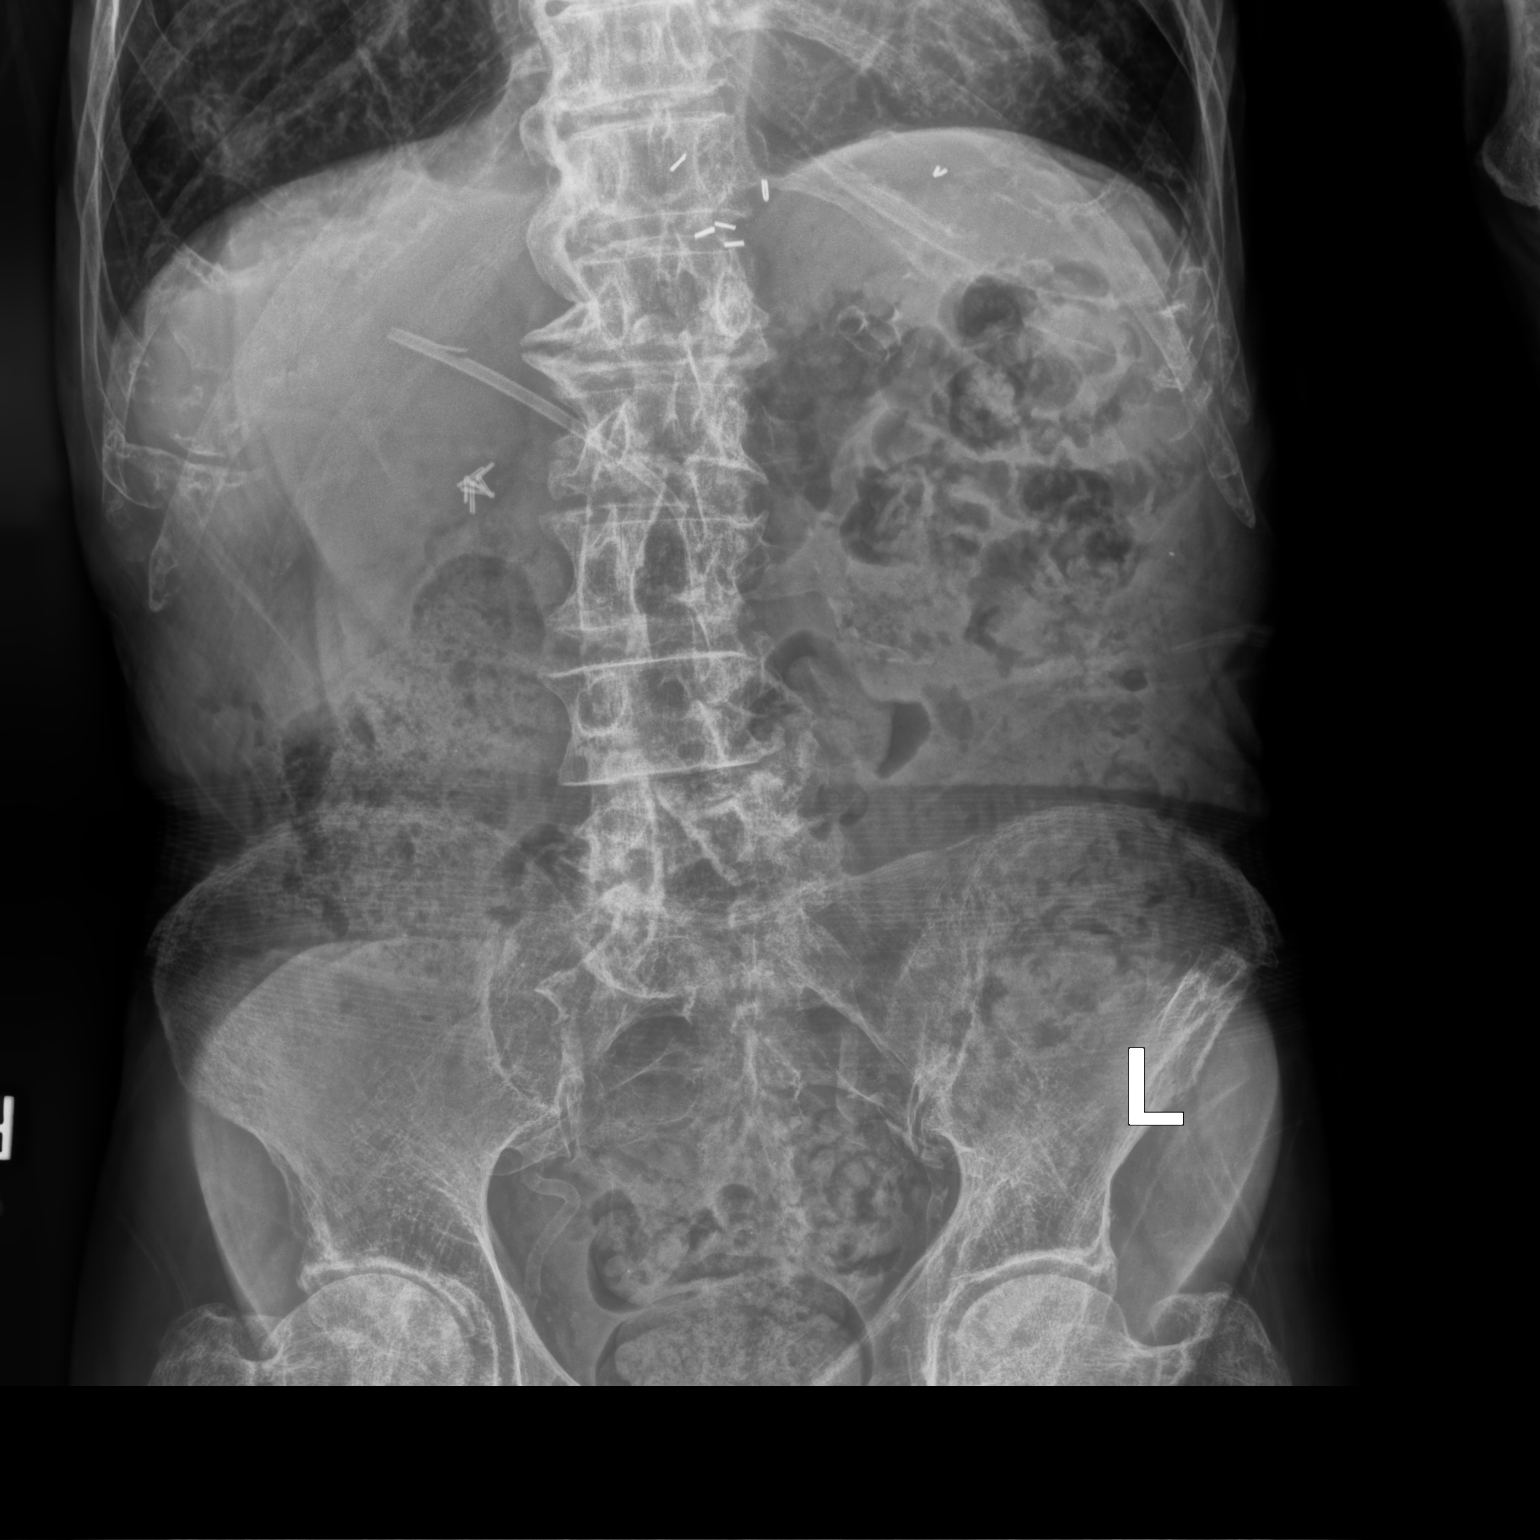

[1 of 1 positions shown; findings below may reference images not displayed]

FINDINGS: Normal bowel gas pattern. Mild to moderate retained stool throughout
the colon.

Cholecystectomy clips right upper quadrant. Surgical clips at the GE
junction. Plastic biliary stent in good position and unchanged.
Atherosclerotic disease. No acute skeletal abnormality. Degenerative
change in the right hip.
IMPRESSION: Normal bowel gas pattern with retained stool in the colon. Biliary
stent remains in good position.

## 2020-08-15 NOTE — Telephone Encounter (Signed)
Order given to [place].

## 2020-08-25 ENCOUNTER — Ambulatory Visit: Payer: Medicare Other | Admitting: Cardiology

## 2020-08-29 ENCOUNTER — Encounter: Payer: Self-pay | Admitting: Cardiology

## 2020-08-29 ENCOUNTER — Ambulatory Visit (INDEPENDENT_AMBULATORY_CARE_PROVIDER_SITE_OTHER): Payer: Medicare Other | Admitting: Cardiology

## 2020-08-29 ENCOUNTER — Other Ambulatory Visit: Payer: Self-pay

## 2020-08-29 VITALS — BP 181/72 | HR 61 | Ht 67.0 in | Wt 91.6 lb

## 2020-08-29 DIAGNOSIS — R627 Adult failure to thrive: Secondary | ICD-10-CM

## 2020-08-29 DIAGNOSIS — F02818 Dementia in other diseases classified elsewhere, unspecified severity, with other behavioral disturbance: Secondary | ICD-10-CM

## 2020-08-29 DIAGNOSIS — I2510 Atherosclerotic heart disease of native coronary artery without angina pectoris: Secondary | ICD-10-CM | POA: Diagnosis not present

## 2020-08-29 DIAGNOSIS — I10 Essential (primary) hypertension: Secondary | ICD-10-CM | POA: Diagnosis not present

## 2020-08-29 DIAGNOSIS — E43 Unspecified severe protein-calorie malnutrition: Secondary | ICD-10-CM | POA: Diagnosis not present

## 2020-08-29 DIAGNOSIS — Z9861 Coronary angioplasty status: Secondary | ICD-10-CM | POA: Diagnosis not present

## 2020-08-29 DIAGNOSIS — F0281 Dementia in other diseases classified elsewhere with behavioral disturbance: Secondary | ICD-10-CM

## 2020-08-29 DIAGNOSIS — E785 Hyperlipidemia, unspecified: Secondary | ICD-10-CM

## 2020-08-29 DIAGNOSIS — I350 Nonrheumatic aortic (valve) stenosis: Secondary | ICD-10-CM | POA: Diagnosis not present

## 2020-08-29 MED ORDER — HYDRALAZINE HCL 25 MG PO TABS: ORAL_TABLET | ORAL | 6 refills | Status: DC

## 2020-08-29 NOTE — Progress Notes (Signed)
Primary Care Provider: Dorothyann Peng, NP Cardiologist: Glenetta Hew, MD Electrophysiologist: None  Clinic Note: Chief Complaint  Patient presents with  . Follow-up    Actually feels pretty good today.  Has stopped all medications except for Plavix.  No major complaints.  . Coronary Artery Disease    No angina    HPI:    Nicholas Porter is a 86 y.o. male with a longstanding cardiac history reviewed below-most recently anterolateral STEMI in October 2020 with PTCA of diagonal branch and staged PCI RCA), moderate aortic stenosis, severe weight loss/protein malnutrition who resents here for 75-month follow-up..  CVD And Significant Recent history:  Cardiac Cath: 2002 2011 showed only moderate disease.  No intervention at this time.  Inferior STEMI (09/2013) emergent PCI with Promus DES to RCA (3.0 mm x 28 mm - 3.2 mm)  03/2014: L Carotid Dz - s/p CEA   Unstable Angina 05/2014: ISR - cutting balloon PTCA ; Ostial OM2 80% (stable)   Anterolateral STEMI: PTCA of diagonal with staged PCI of RCA 95% lesion after stent.    Complicated by acute on chronic renal failure. --->  Placed on high-dose high intensity statin.  No ACE I-ARB because of renal failure.  Imdur and hydralazine were titrated for hypertension.  Moderate Aortic Stenosis  Acute cholelithiasis/sepsis-ERCP and biliary stent in 2020)  Prolonged rehab, longstanding CARDIAC FAILURE TO THRIVE  For the last 2-3 years he has had significant sways in his blood pressure where he is been placed on multiple different medications and then only to have hypotension issues.  As many times I try to cut down on his medications have been added back during hospitalizations for hypertension.  He actually simply stopped taking all his medications except for Plavix.  He understands he needs to take Plavix because of his stents.  Otherwise has stopped everything including low-dose levothyroxine.   During several previous visits, we have  had frank discussions about end-of-life care.  He did sign a DNR form and the plan was to potentially consider a MOST form.  He has at times indicated desire to forego further hospitalizations.  Recent Hospitalizations:  ? None  Nicholas Porter was last seen on February 23, 2020 along with his wife.  He seemed to be in much better spirits than he had been when he saw Jory Sims, NP.  He did not recall being contentious during those visits.  Tend to be very active just not a lot of energy.  Has a lot of balance issues and orthostatic hypotension with wooziness standing up.  Wife had to help him with some questions.  Some irregular heartbeats but nothing significant.  No chest pain or pressure.   Reviewed  CV studies:    The following studies were reviewed today: (if available, images/films reviewed: From Epic Chart or Care Everywhere)  none   Interval History:   Nicholas Porter returns here today along with his wife.  He claims to be very active doing yard work (however his wife indicates that basically he mows the lawn on his riding mower and plays with the dog.  Otherwise he likes to take naps during the day and sits around in the chair watching TV or sleeping.  He is not eating very much.  His wife tried to get him to drink shakes, but he claims not like them.  When I asked him about this he does not acknowledge that.  He definitely does not have very much energy.  Is very weak,  getting worn out when he does things.  But he is overall feeling better.  He does not use a cane and is trying to get a walker to help.  He firmly announced that he has stopped all of his medications with exception of Plavix.  He had stopped Plavix but was convinced that this needs to be continued.  He tells me that his home blood pressure readings range in the 140s-150s over 70s.  He has not had any lightheadedness or dizziness since being off medications.  Cardiovascular ROS: positive for - dyspnea on exertion,  irregular heartbeat and palpitations negative for - chest pain, edema, loss of consciousness, orthopnea, paroxysmal nocturnal dyspnea, rapid heart rate, shortness of breath or /Near syncope, TIA/amaurosis fugax, but not walking enough to note claudication.  The patient does have symptoms concerning for COVID-19 infection (fever, chills, cough, or new shortness of breath).  The patient is practicing social distancing & Masking.  I did not discuss whether or not he had a COVID-19 vaccines or not.  REVIEWED OF SYSTEMS   Review of Systems  Constitutional: Positive for malaise/fatigue (Mostly notes feeling weak and somewhat easily fatigued, but not nearly the profound level of fatigue noted during his most recent follow-up.) and weight loss (Not eating as much as he used to.  He does not like to drink his shakes.).  HENT: Negative for congestion and nosebleeds.   Eyes:       Feels that his eyes are burning, he seems to be always rubbing them.  Respiratory: Negative for shortness of breath.   Cardiovascular: Negative for chest pain.  Gastrointestinal: Negative for abdominal pain, blood in stool, constipation, diarrhea (Intermittent loose stools) and melena.  Genitourinary: Negative for hematuria.  Musculoskeletal: Positive for joint pain (Mild arthritis). Negative for falls.  Neurological: Positive for dizziness (Mostly positional) and weakness (General). Negative for sensory change and headaches.  Psychiatric/Behavioral: Positive for memory loss (He exhibits memory loss although he is not acknowledging.). Negative for depression (Does not seem to be endorsing many symptoms of depression) and hallucinations. The patient is not nervous/anxious.    I have reviewed and (if needed) personally updated the patient's problem list, medications, allergies, past medical and surgical history, social and family history.   PAST MEDICAL HISTORY   Past Medical History:  Diagnosis Date  . Acute metabolic  encephalopathy 12/28/1094  . Acute renal failure superimposed on stage 4 chronic kidney disease ([place]) 11/27/2015  . Anemia of chronic renal failure, stage 4 (severe) ([place]) 11/27/2015  . Anxiety   . Arthritis    back  . 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 CAD S/P percutaneous coronary angioplasty - 09/2013   a) 09/2013 - Inf STEMI (Promus DES 3 x 28 DES mRCA); b 05/2014 - UnstAngina - 85% RCA ISR - scoring & [place] balloon PTCA; c) Anterolat STEMI - PTCA only of 99% D1 (small), staged DES (RESOLUTE ONYX DES 3 MM 18 MM) PCI of 95% mRCA lesion after initial stent - Overlapping DES  . Calcific aortic stenosis 10/01/2019   Echo shows functionally bicuspid aortic valve with calcified mild to moderate AS-mean gradient 19 mm, peak 34 mmHg.  Marland Kitchen Cholelithiasis with acute cholecystitis with biliary obstruction 05/2019   Cholecystectomy and biliary stent placed with ERCP  . CKD (chronic kidney disease) stage 3, GFR 30-59 ml/min   . COPD (chronic obstructive pulmonary disease) ([place])   . Dementia ([place]) 05/13/2019  .  Depression   . Essential hypertension 06/30/2007   Qualifier: Diagnosis of  By: Cori Razor RN, Mikal Plane Eye abnormalities    injections in eyes 12/2017  . H/O Inferior STEMI (09/2013) 10/05/2013   emergent PCI with Promus DES to RCA (3.0 mm  x 28 mm - 3.2 mm)  . History of Anterolateral STEMI 10/01/2019   D1 99% thrombotic lesion-PTCA only.  Had staged PCI of m RCA 95% lesion after prior stent.  . Hypothyroidism   . Klebsiella sepsis ([place]) 04/02/2017  . Left-sided carotid artery disease -- s/p CEA 04/06/2006   S/p Left CEA    Immunization History  Administered Date(s) Administered  . Fluad Quad(high Dose 65+) 11/24/2019  . Influenza Whole 09/16/2008  . Influenza, High Dose Seasonal PF 09/28/2016, 09/30/2017, 10/08/2018  . Influenza,inj,Quad PF,6+ Mos 08/26/2014  . Influenza-Unspecified 10/14/2015  .  Janssen (J&J) SARS-COV-2 Vaccination 03/05/2020  . Pneumococcal Conjugate-13 07/20/2015  . Pneumococcal Polysaccharide-23 12/23/2004, 09/09/2014  . Td 10/18/2008, 05/30/2010  . Tdap 01/12/2018    PAST SURGICAL HISTORY   Past Surgical History:  Procedure Laterality Date  . AMPUTATION     left index finger -tramatic  . BILIARY STENT PLACEMENT N/A 01/06/2019   Procedure: BILIARY STENT PLACEMENT;  Surgeon: Clarene Essex, MD;  Location: [facility];  Service: Endoscopy;  Laterality: N/A;  . CAROTID ENDARTERECTOMY Left 2007   Dr.Hayes: 08/2016 Dopplers: Stable less than 40% right internal carotid stenosis and stable moderate 40-60% left internal carotid stenosis. Patent vertebral and subclavian arteries.  . CARPAL TUNNEL RELEASE    . CHOLECYSTECTOMY N/A 01/02/2019   Procedure: LAPAROSCOPIC CHOLECYSTECTOMY;  Surgeon: Michael Boston, MD;  Location: [facility];  Service: General;  Laterality: N/A;  . CORNEAL TRANSPLANT    . CORONARY ANGIOPLASTY WITH STENT PLACEMENT  09/2013; 05/2014    d) Promus DES 3.0 mm x 28 mm (3.2 mm)  to RCA with STEMI; residual 70% mid-distal LAD, OM 270-80%. Medical management; b) PTCA of 85 % mRCA ISR (3.25 mm scoring balloon, 3.5 mm [place] balloon), LAD lesion noted as ~40%  . CORONARY STENT INTERVENTION N/A 10/08/2019   Procedure: CORONARY STENT INTERVENTION;  Surgeon: Wellington Hampshire, MD;  Location: [place] [facility]; ; 95% mRCA after previous stent--overlapping DES PCI: Resolute Onyx 3.0 mm 18 mm  . CORONARY/GRAFT ACUTE MI REVASCULARIZATION N/A 10/01/2019   Procedure: Coronary/Graft Acute MI Revascularization;  Surgeon: Burnell Blanks, MD;  Location: [place] [facility]; ANTEROLATERAL STEMI: PTCA only of 99% subtotal occluded D1;  . ERCP N/A 01/06/2019   Procedure: ENDOSCOPIC RETROGRADE CHOLANGIOPANCREATOGRAPHY (ERCP);  Surgeon: Clarene Essex, MD;  Location: [facility];  Service: Endoscopy;  Laterality: N/A;  . ERCP N/A 05/28/2019   Procedure: ENDOSCOPIC RETROGRADE  CHOLANGIOPANCREATOGRAPHY (ERCP);  Surgeon: Clarene Essex, MD;  Location: [facility];  Service: Endoscopy;  Laterality: N/A;  . FOOT SURGERY     left  . HERNIA REPAIR    . LEFT HEART CATH AND CORONARY ANGIOGRAPHY N/A 10/01/2019   Procedure: LEFT HEART CATH AND CORONARY ANGIOGRAPHY;  Surgeon: Burnell Blanks, MD;  Location: [facility];    . LEFT HEART CATH AND CORONARY ANGIOGRAPHY  11/11/2010   Procedure: LEFT HEART CATH AND CORONARY ANGIOGRAPHY;  Surgeon: Leonie Man, MD;  Location: [facility]; 40% mid LAD, 50% proximal RCA, 30-40% distal RCA (DOMINANT RCA with wraparound PDA & 2 large PLBs), 60-70% ostial OM1. No change from 2001. Medical therapy  . LEFT HEART CATHETERIZATION WITH CORONARY ANGIOGRAM  N/A 10/05/2013   Procedure: LEFT HEART CATHETERIZATION WITH CORONARY ANGIOGRAM;  Surgeon: Peter M Martinique, MD;  Location: [facility] CATH LAB;  Service: Cardiovascular; INFERIOR STEMI: RCA 100% - PCI,CxOM stable, LAD ~70% mid;  . LEFT HEART CATHETERIZATION WITH CORONARY ANGIOGRAM N/A 06/08/2014   Procedure: LEFT HEART CATHETERIZATION WITH CORONARY ANGIOGRAM;  Surgeon: Troy Sine, MD;  Location: [facility] CATH LAB; UA 6/'15: 75% ISR in RCA - PTCA, LAD lesion ~40%, CxOM stable.    Marland Kitchen NM MYOVIEW LTD  03/2017    - Elevatred Troponin in setting of Sepsis -- DEMAND ISCHEMIA.  Normal perfusion. LVEF 51% with normal wall motion. This is a low risk study.  Marland Kitchen PARTIAL GASTRECTOMY    . PERCUTANEOUS CORONARY INTERVENTION-BALLOON ONLY  06/08/2014   Procedure: PERCUTANEOUS CORONARY INTERVENTION-BALLOON ONLY;  Surgeon: Troy Sine, MD;  Location: [facility] CATH LAB;  Service: Cardiovascular;;  . REMOVAL OF STONES  05/28/2019   Procedure: REMOVAL OF STONES;  Surgeon: Clarene Essex, MD;  Location: [facility];  Service: Endoscopy;;  . ROTATOR CUFF REPAIR     left  . SHOULDER ARTHROSCOPY WITH ROTATOR CUFF REPAIR AND SUBACROMIAL DECOMPRESSION Right 07/01/2013   Procedure: RIGHT SHOULDER ARTHROSCOPY WITH   SUBACROMIAL DECOMPRESSION/DISTAL CLAVICLE RESECTION/ROTATOR CUFF REPAIR ;  Surgeon: Marin Shutter, MD;  Location: [facility];  Service: Orthopedics;  Laterality: Right;  . SPHINCTEROTOMY  01/06/2019   Procedure: SPHINCTEROTOMY;  Surgeon: Clarene Essex, MD;  Location: [facility];  Service: Endoscopy;;  . SPINE SURGERY  1985   cervical laminectomy  . STENT REMOVAL  05/28/2019   Procedure: STENT REMOVAL;  Surgeon: Clarene Essex, MD;  Location: [facility];  Service: Endoscopy;;  . TOTAL KNEE ARTHROPLASTY    . TRANSTHORACIC ECHOCARDIOGRAM  09/'17; 4/'18; 1/'20   a) Normal EF 55-60%. Mild to moderate AS with mod AI. Mild MR.;; b) Mild global reduction in LV systolic function: 24-26%; grade 1 DD - high LVEDP. Mild AS/AI/TR. Mild-Mod MR; moderately elevated PAP.;; c) TTE 12/2018: EF 55-60%. No RWMA. Gr1 DD. Mod AS with mild AI. Severe LA dilation.  . TRANSTHORACIC ECHOCARDIOGRAM  10/01/2019   ANTEROLATERAL STEMI: EF 45 to 50%.  GR 1 DD.  Normal atrial sizes.  Mild mitral stenosis..  Functionally bicuspid aortic valve with moderate AS (mean gradient 19 mmHg, peak 34 mmHg).  Marland Kitchen VAGOTOMY     partial gastrectomy    10/01/2019: Cath- D1 99% thrombotic (PTCA only-- 20%), pLAD 50%. p-mRCA Stent ISR 20%, mRCA 95%- after stent (stage PCI), m-d RCA 40%. pCx 50%.   10/08/19 - staged PCI RCA - Resolute DES 3x18 (overlaps old stent) - LVEDP 22 mmHg -> changed ot Plavix  10/01/2019 TTE : (In setting of STEMI) EF 45 to 50%.  GR 1 DD.  Normal atrial sizes.  Mild mitral stenosis..  Functionally bicuspid aortic valve with moderate AS (mean gradient 19 mmHg, peak 34 mmHg).  Diagnostic - 1    Intervention     Intervention     MEDICATIONS/ALLERGIES   Current Meds  Medication Sig  . clopidogrel (PLAVIX) 75 MG tablet Take 1 tablet (75 mg total) by mouth daily.  Marland Kitchen latanoprost (XALATAN) 0.005 % ophthalmic solution 1 drop at bedtime.    Allergies  Allergen Reactions  . Amlodipine Besylate Hives  . Lipitor  [Atorvastatin] Itching  . Naproxen Diarrhea  . Oxycodone-Acetaminophen Itching  . Isosorbide Rash    SOCIAL HISTORY/FAMILY HISTORY   Reviewed in Epic:  Pertinent findings: Seems to be doing better as far as obtaining his groceries. **  As far as I can tell, remains DNR.  OBJCTIVE -PE, EKG, labs   Wt Readings from Last 3 Encounters:  08/29/20 91 lb 9.6 oz (41.5 kg)  02/23/20 98 lb 3.2 oz (44.5 kg)  11/24/19 94 lb 6.4 oz (42.8 kg)   Physical Exam: BP (!) 181/72   Pulse 61   Ht 5\' 7"  (1.702 m)   Wt 91 lb 9.6 oz (41.5 kg)   SpO2 98%   BMI 14.35 kg/m  Physical Exam Vitals reviewed.  Constitutional:      General: He is not in acute distress.    Appearance: He is ill-appearing (Chronically ill-appearing/cachectic).     Comments: Thin/frail elderly gentleman.  Well-groomed but somewhat unshaven.   HENT:     Head: Normocephalic and atraumatic.     Comments: Temporal wasting Neck:     Vascular: No carotid bruit (Soft bilateral bruits), hepatojugular reflux or JVD.  Cardiovascular:     Rate and Rhythm: Normal rate and regular rhythm. Occasional extrasystoles are present.    Chest Wall: PMI is not displaced (Somewhat sustained, hyperdynamic related to very thin chest wall).     Pulses: Decreased pulses (Palpable but diminished pedal pulses.).     Heart sounds: S1 normal and S2 normal. Murmur heard. High-pitched harsh crescendo-decrescendo midsystolic murmur is present with a grade of 3/6 at the upper right sternal border radiating to the neck.  No friction rub. No gallop.   Pulmonary:     Effort: Pulmonary effort is normal. No respiratory distress.     Breath sounds: Normal breath sounds. No wheezing or rales.  Abdominal:     Comments: Thin scaphoid abdomen.  No HSM  Musculoskeletal:        General: Normal range of motion.     Cervical back: Normal range of motion and neck supple.     Comments: Significant muscle wasting  Neurological:     General: No focal deficit present.       Mental Status: He is alert and oriented to person, place, and time.  Psychiatric:        Mood and Affect: Mood normal.        Behavior: Behavior normal.        Thought Content: Thought content normal.        Judgment: Judgment normal.     Comments: Clearly has some issues with recall.    Adult ECG Report NSR heart rate 61 bpm.  PAC noted.  Anteroseptal MI, age undetermined.  Otherwise normal intervals durations.  Stable EKG.  Recent Labs: No new labs Lab Results  Component Value Date   CHOL 147 10/01/2019   HDL 60 10/01/2019   LDLCALC 64 10/01/2019   TRIG 114 10/01/2019   CHOLHDL 2.5 10/01/2019   Lab Results  Component Value Date   CREATININE 2.34 (H) 10/08/2019   BUN 63 (H) 10/08/2019   NA 144 10/08/2019   K 3.9 10/08/2019   CL 115 (H) 10/08/2019   CO2 21 (L) 10/08/2019   Lab Results  Component Value Date   TSH 5.576 (H) 10/01/2019   CBC Latest Ref Rng & Units 10/08/2019 10/08/2019 10/07/2019  WBC 4.0 - 10.5 K/uL 6.8 - 5.7  Hemoglobin 13.0 - 17.0 g/dL 10.0(L) 10.4(L) 7.6(L)  Hematocrit 39 - 52 % 30.1(L) 29.6(L) 22.8(L)  Platelets 150 - 400 K/uL 97(L) - 82(L)    ASSESSMENT/PLAN    Problem List Items Addressed This Visit    CAD S/P PC: DES in RCA with PTCA for ISR &  recent staged overlapping DES stent for m-dRCA tenosis & PTCA of Culprit 99% D1 lesion; Moderate mLAD, 80% ostial OM2 stable - Primary (Chronic)    Thankfully, no further angina.  He has several longstanding stents.  Most recently just about a year ago.  Per his report  Per his insistence, he only is willing to take Plavix      Relevant Medications   hydrALAZINE (APRESOLINE) 25 MG tablet   Other Relevant Orders   EKG 12-Lead (Completed)   Essential hypertension (Chronic)    Labile blood pressure.  I think he feels better with his pressures being higher.  His pressures at home tend to be much better than they are here.  He is not going to take any medications, citing that anytime he takes blood  pressure medicines he just feels like a zombie.  We tried before to treat, and he has stopped medications at least 3 times.  At this point, we will allow for permissive hypertension for him to have better quality of life.  Syncope that he use hydralazine as needed for systolic pressures greater than 170 mmHg.  He can take this up to 3 times a day as needed.      Relevant Medications   hydrALAZINE (APRESOLINE) 25 MG tablet   Hyperlipidemia with target LDL less than 70 (Chronic)    I would like to just see what a new level of lipids and chemistry panel will show.  He has had pretty significant malnutrition exacerbated by the COVID-19 pandemic.  Refused to take any cholesterol medicines  .  Would like to get one final recording.      Relevant Medications   hydrALAZINE (APRESOLINE) 25 MG tablet   Protein-calorie malnutrition, severe (Chronic)   Moderate aortic stenosis by prior echocardiogram (Chronic)    Functionally bicuspid aortic valve by most recent echo last year showing moderate stenosis with a mean gradient of 19 mmHg.  He would not be interested in invasive procedures to treat unless symptoms get to the point of him having a change in mind.  As such, we decided not to follow-up with echocardiograms on an annual basis.      Relevant Medications   hydrALAZINE (APRESOLINE) 25 MG tablet   Dementia ([place])    He definitely has up-and-down days.  Has some significant memory issues and does not recall interactions or things that he is set are done.  I think this is probably part of the reason why he is here to take medications because he is scared to take too much of the medication.      Failure to thrive in adult    He is lost even more weight now.  Had stabilized there for little bit.  I encouraged him to supplement meals with what ever type of protein shake he would want to drink.  We talked about [employer] which is probably less expensive than loose.  He does not like  milk, and I think maybe the shakes that are made water-based as opposed to what based would probably work better.          COVID-19 Education: The signs and symptoms of COVID-19 were discussed with the patient and how to seek care for testing (follow up with PCP or arrange E-visit).   The importance of social distancing was discussed today.  I spent a total of 26 minutes with the patient. >  50% of the time was spent in direct patient consultation.  Additional time spent  with chart review  / charting (studies, outside notes, etc): 16 --> I personally reviewed his cardiac cath films both initial and staged PCI films as well as his echocardiogram.  We reviewed these pictures together in the clinic room. Total Time: 42 min   Current medicines are reviewed at length with the patient today.  (+/- concerns) none   Patient Instructions / Medication Changes & Studies & Tests Ordered   Patient Instructions  Medication Instructions:     Hydralazine 25 mg  Take 25 mg tablet if blood pressure ie equal or greater than 170 ( top number) up to 3 three times a day if needed   *If you need a refill on your cardiac medications before your next appointment, please call your pharmacy*   Lab Work:  Not needed    Testing/Procedures: Not needed   Follow-Up: At [facility], you and your health needs are our priority.  As part of our continuing mission to provide you with exceptional heart care, we have created designated Provider Care Teams.  These Care Teams include your primary Cardiologist (physician) and Advanced Practice Providers (APPs -  Physician Assistants and Nurse Practitioners) who all work together to provide you with the care you need, when you need it.  We recommend signing up for the patient portal called "MyChart".  Sign up information is provided on this After Visit Summary.  MyChart is used to connect with patients for Virtual Visits (Telemedicine).  Patients are able to  view lab/test results, encounter notes, upcoming appointments, etc.  Non-urgent messages can be sent to your provider as well.   To learn more about what you can do with MyChart, go to [email or web address].    Your next appointment:   6 month(s)  The format for your next appointment:   In Person  Provider:   Glenetta Hew, MD   Other Instructions  EAT EAT EAT ANYTHING YOU WANT     Studies Ordered:   Orders Placed This Encounter  Procedures  . EKG 12-Lead     Glenetta Hew, M.D., M.S. Interventional Cardiologist   Pager # [phone] Phone # [phone] [address]. [place], [place] [address]   Thank you for choosing Heartcare at [facility]!!

## 2020-08-29 NOTE — Patient Instructions (Addendum)
Medication Instructions:     Hydralazine 25 mg  Take 25 mg tablet if blood pressure ie equal or greater than 170 ( top number) up to 3 three times a day if needed   *If you need a refill on your cardiac medications before your next appointment, please call your pharmacy*   Lab Work:  Not needed    Testing/Procedures: Not needed   Follow-Up: At [facility], you and your health needs are our priority.  As part of our continuing mission to provide you with exceptional heart care, we have created designated Provider Care Teams.  These Care Teams include your primary Cardiologist (physician) and Advanced Practice Providers (APPs -  Physician Assistants and Nurse Practitioners) who all work together to provide you with the care you need, when you need it.  We recommend signing up for the patient portal called "MyChart".  Sign up information is provided on this After Visit Summary.  MyChart is used to connect with patients for Virtual Visits (Telemedicine).  Patients are able to view lab/test results, encounter notes, upcoming appointments, etc.  Non-urgent messages can be sent to your provider as well.   To learn more about what you can do with MyChart, go to [email or web address].    Your next appointment:   6 month(s)  The format for your next appointment:   In Person  Provider:   Glenetta Hew, MD   Other Instructions  EAT EAT EAT ANYTHING YOU WANT

## 2020-09-07 ENCOUNTER — Encounter: Payer: Self-pay | Admitting: Cardiology

## 2020-09-07 NOTE — Assessment & Plan Note (Signed)
He is lost even more weight now.  Had stabilized there for little bit.  I encouraged him to supplement meals with what ever type of protein shake he would want to drink.  We talked about [employer] which is probably less expensive than loose.  He does not like milk, and I think maybe the shakes that are made water-based as opposed to what based would probably work better.

## 2020-09-07 NOTE — Assessment & Plan Note (Signed)
Functionally bicuspid aortic valve by most recent echo last year showing moderate stenosis with a mean gradient of 19 mmHg.  He would not be interested in invasive procedures to treat unless symptoms get to the point of him having a change in mind.  As such, we decided not to follow-up with echocardiograms on an annual basis.

## 2020-09-07 NOTE — Assessment & Plan Note (Addendum)
Labile blood pressure.  I think he feels better with his pressures being higher.  His pressures at home tend to be much better than they are here.  He is not going to take any medications, citing that anytime he takes blood pressure medicines he just feels like a zombie.  We tried before to treat, and he has stopped medications at least 3 times.  At this point, we will allow for permissive hypertension for him to have better quality of life.  Syncope that he use hydralazine as needed for systolic pressures greater than 170 mmHg.  He can take this up to 3 times a day as needed.

## 2020-09-07 NOTE — Assessment & Plan Note (Signed)
I would like to just see what a new level of lipids and chemistry panel will show.  He has had pretty significant malnutrition exacerbated by the COVID-19 pandemic.  Refused to take any cholesterol medicines  .  Would like to get one final recording.

## 2020-09-07 NOTE — Assessment & Plan Note (Signed)
Thankfully, no further angina.  He has several longstanding stents.  Most recently just about a year ago.  Per his report  Per his insistence, he only is willing to take Plavix

## 2020-09-07 NOTE — Assessment & Plan Note (Signed)
He definitely has up-and-down days.  Has some significant memory issues and does not recall interactions or things that he is set are done.  I think this is probably part of the reason why he is here to take medications because he is scared to take too much of the medication.

## 2020-09-11 DIAGNOSIS — H02051 Trichiasis without entropian right upper eyelid: Secondary | ICD-10-CM | POA: Diagnosis not present

## 2020-10-31 ENCOUNTER — Emergency Department (HOSPITAL_COMMUNITY): Payer: Medicare Other

## 2020-10-31 ENCOUNTER — Inpatient Hospital Stay (HOSPITAL_COMMUNITY)
Admission: EM | Admit: 2020-10-31 | Discharge: 2020-11-02 | DRG: 563 | Disposition: A | Discharge status: Skilled Nursing Facility | Payer: Medicare Other | Attending: Family Medicine | Admitting: Family Medicine

## 2020-10-31 ENCOUNTER — Other Ambulatory Visit: Payer: Self-pay

## 2020-10-31 ENCOUNTER — Encounter (HOSPITAL_COMMUNITY): Payer: Self-pay

## 2020-10-31 DIAGNOSIS — E039 Hypothyroidism, unspecified: Secondary | ICD-10-CM | POA: Diagnosis present

## 2020-10-31 DIAGNOSIS — H409 Unspecified glaucoma: Secondary | ICD-10-CM | POA: Diagnosis not present

## 2020-10-31 DIAGNOSIS — H04123 Dry eye syndrome of bilateral lacrimal glands: Secondary | ICD-10-CM | POA: Diagnosis not present

## 2020-10-31 DIAGNOSIS — E875 Hyperkalemia: Secondary | ICD-10-CM | POA: Diagnosis not present

## 2020-10-31 DIAGNOSIS — Z87891 Personal history of nicotine dependence: Secondary | ICD-10-CM | POA: Diagnosis not present

## 2020-10-31 DIAGNOSIS — Z23 Encounter for immunization: Secondary | ICD-10-CM

## 2020-10-31 DIAGNOSIS — Z833 Family history of diabetes mellitus: Secondary | ICD-10-CM

## 2020-10-31 DIAGNOSIS — W1831XA Fall on same level due to stepping on an object, initial encounter: Secondary | ICD-10-CM | POA: Diagnosis present

## 2020-10-31 DIAGNOSIS — R636 Underweight: Secondary | ICD-10-CM | POA: Diagnosis present

## 2020-10-31 DIAGNOSIS — I6522 Occlusion and stenosis of left carotid artery: Secondary | ICD-10-CM | POA: Diagnosis present

## 2020-10-31 DIAGNOSIS — R41841 Cognitive communication deficit: Secondary | ICD-10-CM | POA: Diagnosis not present

## 2020-10-31 DIAGNOSIS — Z96652 Presence of left artificial knee joint: Secondary | ICD-10-CM | POA: Diagnosis present

## 2020-10-31 DIAGNOSIS — Z8249 Family history of ischemic heart disease and other diseases of the circulatory system: Secondary | ICD-10-CM | POA: Diagnosis not present

## 2020-10-31 DIAGNOSIS — R8290 Unspecified abnormal findings in urine: Secondary | ICD-10-CM | POA: Diagnosis present

## 2020-10-31 DIAGNOSIS — Z888 Allergy status to other drugs, medicaments and biological substances status: Secondary | ICD-10-CM

## 2020-10-31 DIAGNOSIS — I252 Old myocardial infarction: Secondary | ICD-10-CM

## 2020-10-31 DIAGNOSIS — M25562 Pain in left knee: Secondary | ICD-10-CM | POA: Diagnosis not present

## 2020-10-31 DIAGNOSIS — M25062 Hemarthrosis, left knee: Secondary | ICD-10-CM | POA: Diagnosis not present

## 2020-10-31 DIAGNOSIS — D696 Thrombocytopenia, unspecified: Secondary | ICD-10-CM | POA: Diagnosis present

## 2020-10-31 DIAGNOSIS — Z885 Allergy status to narcotic agent status: Secondary | ICD-10-CM

## 2020-10-31 DIAGNOSIS — Z66 Do not resuscitate: Secondary | ICD-10-CM | POA: Diagnosis present

## 2020-10-31 DIAGNOSIS — F0390 Unspecified dementia without behavioral disturbance: Secondary | ICD-10-CM | POA: Diagnosis present

## 2020-10-31 DIAGNOSIS — I10 Essential (primary) hypertension: Secondary | ICD-10-CM | POA: Diagnosis not present

## 2020-10-31 DIAGNOSIS — S82002A Unspecified fracture of left patella, initial encounter for closed fracture: Secondary | ICD-10-CM | POA: Diagnosis not present

## 2020-10-31 DIAGNOSIS — I2510 Atherosclerotic heart disease of native coronary artery without angina pectoris: Secondary | ICD-10-CM | POA: Diagnosis present

## 2020-10-31 DIAGNOSIS — J449 Chronic obstructive pulmonary disease, unspecified: Secondary | ICD-10-CM | POA: Diagnosis present

## 2020-10-31 DIAGNOSIS — Z681 Body mass index (BMI) 19 or less, adult: Secondary | ICD-10-CM

## 2020-10-31 DIAGNOSIS — W19XXXA Unspecified fall, initial encounter: Secondary | ICD-10-CM

## 2020-10-31 DIAGNOSIS — N184 Chronic kidney disease, stage 4 (severe): Secondary | ICD-10-CM | POA: Diagnosis present

## 2020-10-31 DIAGNOSIS — M25462 Effusion, left knee: Secondary | ICD-10-CM | POA: Diagnosis not present

## 2020-10-31 DIAGNOSIS — D631 Anemia in chronic kidney disease: Secondary | ICD-10-CM | POA: Diagnosis present

## 2020-10-31 DIAGNOSIS — Z955 Presence of coronary angioplasty implant and graft: Secondary | ICD-10-CM

## 2020-10-31 DIAGNOSIS — M25569 Pain in unspecified knee: Secondary | ICD-10-CM | POA: Diagnosis not present

## 2020-10-31 DIAGNOSIS — Z903 Acquired absence of stomach [part of]: Secondary | ICD-10-CM | POA: Diagnosis not present

## 2020-10-31 DIAGNOSIS — Z20822 Contact with and (suspected) exposure to covid-19: Secondary | ICD-10-CM | POA: Diagnosis present

## 2020-10-31 DIAGNOSIS — Z7902 Long term (current) use of antithrombotics/antiplatelets: Secondary | ICD-10-CM

## 2020-10-31 DIAGNOSIS — R634 Abnormal weight loss: Secondary | ICD-10-CM | POA: Diagnosis not present

## 2020-10-31 DIAGNOSIS — M9712XA Periprosthetic fracture around internal prosthetic left knee joint, initial encounter: Secondary | ICD-10-CM | POA: Diagnosis not present

## 2020-10-31 DIAGNOSIS — S82892A Other fracture of left lower leg, initial encounter for closed fracture: Secondary | ICD-10-CM | POA: Diagnosis present

## 2020-10-31 DIAGNOSIS — Z96659 Presence of unspecified artificial knee joint: Secondary | ICD-10-CM

## 2020-10-31 DIAGNOSIS — I129 Hypertensive chronic kidney disease with stage 1 through stage 4 chronic kidney disease, or unspecified chronic kidney disease: Secondary | ICD-10-CM | POA: Diagnosis present

## 2020-10-31 DIAGNOSIS — E46 Unspecified protein-calorie malnutrition: Secondary | ICD-10-CM | POA: Diagnosis not present

## 2020-10-31 DIAGNOSIS — I959 Hypotension, unspecified: Secondary | ICD-10-CM | POA: Diagnosis not present

## 2020-10-31 DIAGNOSIS — Q231 Congenital insufficiency of aortic valve: Secondary | ICD-10-CM

## 2020-10-31 DIAGNOSIS — M6281 Muscle weakness (generalized): Secondary | ICD-10-CM | POA: Diagnosis not present

## 2020-10-31 DIAGNOSIS — M978XXA Periprosthetic fracture around other internal prosthetic joint, initial encounter: Secondary | ICD-10-CM

## 2020-10-31 DIAGNOSIS — Y92019 Unspecified place in single-family (private) house as the place of occurrence of the external cause: Secondary | ICD-10-CM

## 2020-10-31 DIAGNOSIS — S82002D Unspecified fracture of left patella, subsequent encounter for closed fracture with routine healing: Secondary | ICD-10-CM | POA: Diagnosis not present

## 2020-10-31 DIAGNOSIS — Z9861 Coronary angioplasty status: Secondary | ICD-10-CM | POA: Diagnosis not present

## 2020-10-31 LAB — CBC WITH DIFFERENTIAL/PLATELET
Abs Immature Granulocytes: 0.01 10*3/uL (ref 0.00–0.07)
Basophils Absolute: 0 10*3/uL (ref 0.0–0.1)
Basophils Relative: 1 %
Eosinophils Absolute: 0.1 10*3/uL (ref 0.0–0.5)
Eosinophils Relative: 3 %
HCT: 36.7 % — ABNORMAL LOW (ref 39.0–52.0)
Hemoglobin: 12.1 g/dL — ABNORMAL LOW (ref 13.0–17.0)
Immature Granulocytes: 0 %
Lymphocytes Relative: 23 %
Lymphs Abs: 0.9 10*3/uL (ref 0.7–4.0)
MCH: 32.7 pg (ref 26.0–34.0)
MCHC: 33 g/dL (ref 30.0–36.0)
MCV: 99.2 fL (ref 80.0–100.0)
Monocytes Absolute: 0.4 10*3/uL (ref 0.1–1.0)
Monocytes Relative: 11 %
Neutro Abs: 2.5 10*3/uL (ref 1.7–7.7)
Neutrophils Relative %: 62 %
Platelets: 66 10*3/uL — ABNORMAL LOW (ref 150–400)
RBC: 3.7 MIL/uL — ABNORMAL LOW (ref 4.22–5.81)
RDW: 13.9 % (ref 11.5–15.5)
WBC: 4 10*3/uL (ref 4.0–10.5)
nRBC: 0 % (ref 0.0–0.2)

## 2020-10-31 LAB — BASIC METABOLIC PANEL
Anion gap: 7 (ref 5–15)
BUN: 53 mg/dL — ABNORMAL HIGH (ref 8–23)
CO2: 18 mmol/L — ABNORMAL LOW (ref 22–32)
Calcium: 9 mg/dL (ref 8.9–10.3)
Chloride: 113 mmol/L — ABNORMAL HIGH (ref 98–111)
Creatinine, Ser: 2.13 mg/dL — ABNORMAL HIGH (ref 0.61–1.24)
GFR, Estimated: 29 mL/min — ABNORMAL LOW (ref >60)
Glucose, Bld: 130 mg/dL — ABNORMAL HIGH (ref 70–99)
Potassium: 4.6 mmol/L (ref 3.5–5.1)
Sodium: 138 mmol/L (ref 135–145)

## 2020-10-31 LAB — URINALYSIS, ROUTINE W REFLEX MICROSCOPIC
Bilirubin Urine: NEGATIVE
Glucose, UA: NEGATIVE mg/dL
Ketones, ur: NEGATIVE mg/dL
Nitrite: NEGATIVE
Protein, ur: NEGATIVE mg/dL
Specific Gravity, Urine: 1.013 (ref 1.005–1.030)
WBC, UA: >50 WBC/hpf — ABNORMAL HIGH (ref 0–5)
pH: 5 (ref 5.0–8.0)

## 2020-10-31 LAB — PROTIME-INR
INR: 1.1 (ref 0.8–1.2)
Prothrombin Time: 13.3 seconds (ref 11.4–15.2)

## 2020-10-31 MED ORDER — LATANOPROST 0.005 % OP SOLN
1.0000 [drp] | Freq: Every day | OPHTHALMIC | Status: DC
  Administered 2020-10-31 – 2020-11-02 (×2): 1 [drp] via OPHTHALMIC
  Filled 2020-10-31: qty 2.5

## 2020-10-31 MED ORDER — INFLUENZA VAC A&B SA ADJ QUAD 0.5 ML IM PRSY
0.5000 mL | PREFILLED_SYRINGE | INTRAMUSCULAR | Status: AC
  Administered 2020-11-01: 0.5 mL via INTRAMUSCULAR
  Filled 2020-10-31: qty 0.5

## 2020-10-31 MED ORDER — ACETAMINOPHEN 650 MG RE SUPP: 650.0000 mg | Freq: Four times a day (QID) | RECTAL | Status: DC | PRN

## 2020-10-31 MED ORDER — SODIUM CHLORIDE 0.9 % IV SOLN: INTRAVENOUS | Status: DC

## 2020-10-31 MED ORDER — ACETAMINOPHEN 325 MG PO TABS: 650.0000 mg | ORAL_TABLET | Freq: Four times a day (QID) | ORAL | Status: DC | PRN

## 2020-10-31 MED ORDER — ONDANSETRON HCL 4 MG/2ML IJ SOLN: 4.0000 mg | Freq: Four times a day (QID) | INTRAMUSCULAR | Status: DC | PRN

## 2020-10-31 MED ORDER — OXYCODONE HCL 5 MG PO TABS: 5.0000 mg | ORAL_TABLET | ORAL | Status: DC | PRN

## 2020-10-31 MED ORDER — MORPHINE SULFATE (PF) 2 MG/ML IV SOLN: 1.0000 mg | INTRAVENOUS | Status: DC | PRN

## 2020-10-31 MED ORDER — HYDRALAZINE HCL 20 MG/ML IJ SOLN
5.0000 mg | INTRAMUSCULAR | Status: DC | PRN
  Administered 2020-11-01: 5 mg via INTRAVENOUS
  Filled 2020-10-31 (×2): qty 1

## 2020-10-31 MED ORDER — ONDANSETRON HCL 4 MG PO TABS: 4.0000 mg | ORAL_TABLET | Freq: Four times a day (QID) | ORAL | Status: DC | PRN

## 2020-10-31 MED ORDER — SENNOSIDES-DOCUSATE SODIUM 8.6-50 MG PO TABS: 1.0000 | ORAL_TABLET | Freq: Every evening | ORAL | Status: DC | PRN

## 2020-10-31 NOTE — ED Notes (Signed)
Report given to [facility] in 3W.

## 2020-10-31 NOTE — ED Notes (Signed)
Updated family regarding plan of care

## 2020-10-31 NOTE — ED Provider Notes (Signed)
[facility] DEPT Provider Note   CSN: [id number] Arrival date & time: 10/31/20  1041     History Chief Complaint  Patient presents with  . Fall    Nicholas Porter is a 86 y.o. male.  HPI  Patient presents after mechanical fall now with pain in his left knee. His dog apparently got tangled in his feet, the patient fell to the ground, landing on his left knee.  No head trauma, no loss of consciousness.  Since the event he has had difficulty with weightbearing and motion of the knee, though is able to do so without standing during the initial evaluation.  EMS reports the patient was awake and alert interactive appropriately throughout transport. Patient has a history of knee replacement in the distant past, notes that he has been in his usual state of health until the fall.  No distal, nor proximal pain.  Past Medical History:  Diagnosis Date  . Acute metabolic encephalopathy 01/26/8249  . Acute renal failure superimposed on stage 4 chronic kidney disease ([place]) 11/27/2015  . Anemia of chronic renal failure, stage 4 (severe) ([place]) 11/27/2015  . Anxiety   . Arthritis    back  . 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 CAD S/P percutaneous coronary angioplasty - 09/2013   a) 09/2013 - Inf STEMI (Promus DES 3 x 28 DES mRCA); b 05/2014 - UnstAngina - 85% RCA ISR - scoring & [place] balloon PTCA; c) Anterolat STEMI - PTCA only of 99% D1 (small), staged DES (RESOLUTE ONYX DES 3 MM 18 MM) PCI of 95% mRCA lesion after initial stent - Overlapping DES  . Calcific aortic stenosis 10/01/2019   Echo shows functionally bicuspid aortic valve with calcified mild to moderate AS-mean gradient 19 mm, peak 34 mmHg.  Marland Kitchen Cholelithiasis with acute cholecystitis with biliary obstruction 05/2019   Cholecystectomy and biliary stent placed with ERCP  . CKD (chronic kidney disease) stage 3, GFR 30-59 ml/min  (HCC)   . COPD (chronic obstructive pulmonary disease) ([place])   . Dementia ([place]) 05/13/2019  . Depression   . Essential hypertension 06/30/2007   Qualifier: Diagnosis of  By: Cori Razor RN, Mikal Plane Eye abnormalities    injections in eyes 12/2017  . H/O Inferior STEMI (09/2013) 10/05/2013   emergent PCI with Promus DES to RCA (3.0 mm  x 28 mm - 3.2 mm)  . History of Anterolateral STEMI 10/01/2019   D1 99% thrombotic lesion-PTCA only.  Had staged PCI of m RCA 95% lesion after prior stent.  . Hypothyroidism   . Klebsiella sepsis ([place]) 04/02/2017  . Left-sided carotid artery disease -- s/p CEA 04/06/2006   S/p Left CEA     Patient Active Problem List   Diagnosis Date Noted  . Knee pain 10/31/2020  . Goals of care, counseling/discussion   . Palliative care by specialist   . ST elevation myocardial infarction (STEMI) of anterolateral wall ([place]) 10/01/2019  . Failure to thrive in adult 07/20/2019  . Dementia ([place]) 05/13/2019  . Moderate aortic stenosis by prior echocardiogram 01/28/2019  . Epistaxis, recurrent 01/28/2019  . Delayed cystic duct stump bile leak s/p ERCP/stent 01/06/2019 01/07/2019  . Protein-calorie malnutrition, severe 01/04/2019  . CKD (chronic kidney disease), stage III ([place]) 01/03/2019  . Pill dysphagia 01/02/2019  . Acute gangrenous cholecystitis s/p lap cholecystectomy 01/02/2019 01/01/2019  . CKD (chronic kidney disease) stage 4, GFR 15-29 ml/min (  [place]) 01/01/2019  . Unintentional weight loss 01/01/2019  . History of medication noncompliance 01/01/2019  . History of partial gastrectomy 01/01/2019  . Hyperlipidemia with target LDL less than 70 10/23/2018  . Psychiatric disturbance 04/15/2017  . Orthostatic hypotension 04/08/2017  . Acute kidney injury superimposed on chronic kidney disease ([place]) 04/02/2017  . Benign prostatic hyperplasia 03/11/2017  . Weakness   . Anemia of chronic renal failure, stage 4 (severe) ([place]) 11/27/2015  . GERD (gastroesophageal reflux  disease) 01/24/2015  . Thrombocytopenia ([place]) 08/09/2014  . H/O Inferior STEMI (09/2013) emergent PCI with Promus DES to RCA (3.0 mm  x 28 mm - 3.2 mm) 10/05/2013  . 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 CAD S/P PC: DES in RCA with PTCA for ISR & recent staged overlapping DES stent for m-dRCA tenosis & PTCA of [place] 99% D1 lesion; Moderate mLAD, 80% ostial OM2 stable 05/22/2008  . Essential hypertension 06/30/2007  . Left-sided carotid artery disease -- s/p CEA 04/06/2006    Class: History of    Past Surgical History:  Procedure Laterality Date  . AMPUTATION     left index finger -tramatic  . BILIARY STENT PLACEMENT N/A 01/06/2019   Procedure: BILIARY STENT PLACEMENT;  Surgeon: Clarene Essex, MD;  Location: [facility];  Service: Endoscopy;  Laterality: N/A;  . CAROTID ENDARTERECTOMY Left 2007   Dr.Hayes: 08/2016 Dopplers: Stable less than 40% right internal carotid stenosis and stable moderate 40-60% left internal carotid stenosis. Patent vertebral and subclavian arteries.  . CARPAL TUNNEL RELEASE    . CHOLECYSTECTOMY N/A 01/02/2019   Procedure: LAPAROSCOPIC CHOLECYSTECTOMY;  Surgeon: Michael Boston, MD;  Location: [facility];  Service: General;  Laterality: N/A;  . CORNEAL TRANSPLANT    . CORONARY ANGIOPLASTY WITH STENT PLACEMENT  09/2013; 05/2014    d) Promus DES 3.0 mm x 28 mm (3.2 mm)  to RCA with STEMI; residual 70% mid-distal LAD, OM 270-80%. Medical management; b) PTCA of 85 % mRCA ISR (3.25 mm scoring balloon, 3.5 mm [place] balloon), LAD lesion noted as ~40%  . CORONARY STENT INTERVENTION N/A 10/08/2019   Procedure: CORONARY STENT INTERVENTION;  Surgeon: Wellington Hampshire, MD;  Location: [facility]; ; 95% mRCA after previous stent--overlapping DES PCI: Resolute Onyx 3.0 mm 18 mm  . CORONARY/GRAFT ACUTE MI REVASCULARIZATION N/A 10/01/2019   Procedure: Coronary/Graft Acute MI Revascularization;  Surgeon: Burnell Blanks, MD;  Location: [facility]; ANTEROLATERAL STEMI: PTCA  only of 99% subtotal occluded D1;  . ERCP N/A 01/06/2019   Procedure: ENDOSCOPIC RETROGRADE CHOLANGIOPANCREATOGRAPHY (ERCP);  Surgeon: Clarene Essex, MD;  Location: [facility];  Service: Endoscopy;  Laterality: N/A;  . ERCP N/A 05/28/2019   Procedure: ENDOSCOPIC RETROGRADE CHOLANGIOPANCREATOGRAPHY (ERCP);  Surgeon: Clarene Essex, MD;  Location: [facility];  Service: Endoscopy;  Laterality: N/A;  . FOOT SURGERY     left  . HERNIA REPAIR    . LEFT HEART CATH AND CORONARY ANGIOGRAPHY N/A 10/01/2019   Procedure: LEFT HEART CATH AND CORONARY ANGIOGRAPHY;  Surgeon: Burnell Blanks, MD;  Location: [facility] LAB;    . LEFT HEART CATH AND CORONARY ANGIOGRAPHY  11/11/2010   Procedure: LEFT HEART CATH AND CORONARY ANGIOGRAPHY;  Surgeon: Leonie Man, MD;  Location: [facility]; 40% mid LAD, 50% proximal RCA, 30-40% distal RCA (DOMINANT RCA with wraparound PDA & 2 large PLBs), 60-70% ostial OM1. No change from 2001. Medical therapy  . LEFT HEART CATHETERIZATION WITH CORONARY ANGIOGRAM N/A 10/05/2013   Procedure: LEFT HEART  CATHETERIZATION WITH CORONARY ANGIOGRAM;  Surgeon: Peter M Martinique, MD;  Location: [facility] CATH LAB;  Service: Cardiovascular; INFERIOR STEMI: RCA 100% - PCI,CxOM stable, LAD ~70% mid;  . LEFT HEART CATHETERIZATION WITH CORONARY ANGIOGRAM N/A 06/08/2014   Procedure: LEFT HEART CATHETERIZATION WITH CORONARY ANGIOGRAM;  Surgeon: Troy Sine, MD;  Location: [facility] CATH LAB; UA 6/'15: 75% ISR in RCA - PTCA, LAD lesion ~40%, CxOM stable.    Marland Kitchen NM MYOVIEW LTD  03/2017    - Elevatred Troponin in setting of Sepsis -- DEMAND ISCHEMIA.  Normal perfusion. LVEF 51% with normal wall motion. This is a low risk study.  Marland Kitchen PARTIAL GASTRECTOMY    . PERCUTANEOUS CORONARY INTERVENTION-BALLOON ONLY  06/08/2014   Procedure: PERCUTANEOUS CORONARY INTERVENTION-BALLOON ONLY;  Surgeon: Troy Sine, MD;  Location: [facility] CATH LAB;  Service: Cardiovascular;;  . REMOVAL OF STONES  05/28/2019   Procedure:  REMOVAL OF STONES;  Surgeon: Clarene Essex, MD;  Location: [facility];  Service: Endoscopy;;  . ROTATOR CUFF REPAIR     left  . SHOULDER ARTHROSCOPY WITH ROTATOR CUFF REPAIR AND SUBACROMIAL DECOMPRESSION Right 07/01/2013   Procedure: RIGHT SHOULDER ARTHROSCOPY WITH  SUBACROMIAL DECOMPRESSION/DISTAL CLAVICLE RESECTION/ROTATOR CUFF REPAIR ;  Surgeon: Marin Shutter, MD;  Location: [facility];  Service: Orthopedics;  Laterality: Right;  . SPHINCTEROTOMY  01/06/2019   Procedure: SPHINCTEROTOMY;  Surgeon: Clarene Essex, MD;  Location: [facility];  Service: Endoscopy;;  . SPINE SURGERY  1985   cervical laminectomy  . STENT REMOVAL  05/28/2019   Procedure: STENT REMOVAL;  Surgeon: Clarene Essex, MD;  Location: [facility];  Service: Endoscopy;;  . TOTAL KNEE ARTHROPLASTY    . TRANSTHORACIC ECHOCARDIOGRAM  09/'17; 4/'18; 1/'20   a) Normal EF 55-60%. Mild to moderate AS with mod AI. Mild MR.;; b) Mild global reduction in LV systolic function: 51-76%; grade 1 DD - high LVEDP. Mild AS/AI/TR. Mild-Mod MR; moderately elevated PAP.;; c) TTE 12/2018: EF 55-60%. No RWMA. Gr1 DD. Mod AS with mild AI. Severe LA dilation.  . TRANSTHORACIC ECHOCARDIOGRAM  10/01/2019   ANTEROLATERAL STEMI: EF 45 to 50%.  GR 1 DD.  Normal atrial sizes.  Mild mitral stenosis..  Functionally bicuspid aortic valve with moderate AS (mean gradient 19 mmHg, peak 34 mmHg).  Marland Kitchen VAGOTOMY     partial gastrectomy       Family History  Problem Relation Age of Onset  . Heart disease Sister        Heart Transplant   . Diabetes Sister     Social History   Tobacco Use  . Smoking status: Former Smoker    Years: 15.00    Types: Cigarettes    Quit date: 12/23/1964    Years since quitting: 55.8  . Smokeless tobacco: Never Used  Vaping Use  . Vaping Use: Never used  Substance Use Topics  . Alcohol use: No  . Drug use: No    Home Medications Prior to Admission medications   Medication Sig Start Date End Date Taking? Authorizing Provider    clopidogrel (PLAVIX) 75 MG tablet Take 1 tablet (75 mg total) by mouth daily. Patient taking differently: Take 75 mg by mouth 2 (two) times a week. No specific days 02/23/20  Yes Leonie Man, MD  latanoprost (XALATAN) 0.005 % ophthalmic solution Place 1 drop into both eyes at bedtime.    Yes [provider]  hydrALAZINE (APRESOLINE) 25 MG tablet Take 25 mg tablet if blood pressure is equal or greater than 170 ( top  number) up to 3 three times a day if needed Patient not taking: Reported on 10/31/2020 08/29/20   Leonie Man, MD    Allergies    Amlodipine besylate, Lipitor [atorvastatin], Naproxen, Oxycodone-acetaminophen, and Isosorbide  Review of Systems   Review of Systems  Constitutional:       Per HPI, otherwise negative  HENT:       Per HPI, otherwise negative  Respiratory:       Per HPI, otherwise negative  Cardiovascular:       Per HPI, otherwise negative  Gastrointestinal: Negative for vomiting.  Endocrine:       Negative aside from HPI  Genitourinary:       Neg aside from HPI   Musculoskeletal:       Per HPI, otherwise negative  Skin: Positive for wound.  Neurological: Negative for syncope.    Physical Exam Updated Vital Signs BP (!) 192/77 (BP Location: Left Arm)   Pulse 63   Temp 98.1 F (36.7 C) (Oral)   Resp 16   Ht 5\' 7"  (1.702 m)   Wt 40.8 kg   SpO2 99%   BMI 14.10 kg/m   Physical Exam Vitals and nursing note reviewed.  Constitutional:      General: He is not in acute distress.    Appearance: He is well-developed.  HENT:     Head: Normocephalic and atraumatic.  Eyes:     Conjunctiva/sclera: Conjunctivae normal.  Cardiovascular:     Rate and Rhythm: Normal rate and regular rhythm.  Pulmonary:     Effort: Pulmonary effort is normal. No respiratory distress.     Breath sounds: No stridor.  Abdominal:     General: There is no distension.  Musculoskeletal:        General: Deformity present.       Legs:  Skin:    General: Skin  is warm and dry.       Neurological:     Mental Status: He is alert and oriented to person, place, and time.     ED Results / Procedures / Treatments   Labs (all labs ordered are listed, but only abnormal results are displayed) Labs Reviewed  BASIC METABOLIC PANEL - Abnormal; Notable for the following components:      Result Value   Chloride 113 (*)    CO2 18 (*)    Glucose, Bld 130 (*)    BUN 53 (*)    Creatinine, Ser 2.13 (*)    GFR, Estimated 29 (*)    All other components within normal limits  CBC WITH DIFFERENTIAL/PLATELET - Abnormal; Notable for the following components:   RBC 3.70 (*)    Hemoglobin 12.1 (*)    HCT 36.7 (*)    Platelets 66 (*)    All other components within normal limits  URINALYSIS, ROUTINE W REFLEX MICROSCOPIC - Abnormal; Notable for the following components:   APPearance CLOUDY (*)    Hgb urine dipstick SMALL (*)    Leukocytes,Ua LARGE (*)    WBC, UA >50 (*)    Bacteria, UA RARE (*)    All other components within normal limits  RESP PANEL BY RT PCR (RSV, FLU A&B, COVID)  PROTIME-INR    EKG None  Radiology CT Knee Left Wo Contrast  Result Date: 10/31/2020 CLINICAL DATA:  Fall, left knee pain EXAM: CT OF THE LEFT KNEE WITHOUT CONTRAST TECHNIQUE: Multidetector CT imaging of the left knee was performed according to the standard protocol. Multiplanar CT image reconstructions  were also generated. Metal artifact reduction protocol was utilized. COMPARISON:  X-ray 10/31/2020 FINDINGS: Bones/Joint/Cartilage Status post left total knee arthroplasty. Arthroplasty components are in their expected alignment. No definite periprosthetic fracture is identified. No area of periprosthetic lucency is seen. There is metallic streak artifact, which slightly limits evaluation of the adjacent structures. Small suprapatellar knee joint effusion with layering fat-fluid level (series 6, image 27). There is synovial thickening. Ligaments Suboptimally assessed by CT. Muscles  and Tendons No evidence of acute musculotendinous injury by CT. Soft tissues No soft tissue swelling or fluid collection. Advanced atherosclerotic calcifications. IMPRESSION: There is a small suprapatellar knee joint effusion with layering fat-fluid level, compatible with a lipohemarthrosis. Findings are suggestive of an occult intra-articular fracture. No discrete fracture is identified within the limitations of this exam. Electronically Signed   By: Davina Poke D.O.   On: 10/31/2020 16:42   DG Knee Complete 4 Views Left  Result Date: 10/31/2020 CLINICAL DATA:  Fall. EXAM: LEFT KNEE - COMPLETE 4+ VIEW COMPARISON:  None. FINDINGS: No evidence of acute fracture or dislocation. Small joint effusion. Total knee arthroplasty. No evidence of hardware complication. Calcific atherosclerosis. IMPRESSION: 1. No evidence of acute fracture or dislocation. 2. Small joint effusion. Electronically Signed   By: Margaretha Sheffield MD   On: 10/31/2020 11:43    Procedures Procedures (including critical care time)  Medications Ordered in ED Medications - No data to display  ED Course  I have reviewed the triage vital signs and the nursing notes.  Pertinent labs & imaging results that were available during my care of the patient were reviewed by me and considered in my medical decision making (see chart for details).    MDM Rules/Calculators/A&P X-ray unremarkable, CT scan pending given concern for occult fracture, due to patient's inability to bear weight. 5:22 PM I discussed CT, and x-ray results with the patient's wife, and with our orthopedic colleagues presenting with concern for periprosthetic fracture following his fall he will require admission for a.m. orthopedics consult, which our colleagues will facilitate. Patient self remains in no distress, resting comfortably. Other findings generally reassuring, ongoing CKD, largely unchanged, nitrite negative urine, and the patient has no urinary  complaints.  Covid test pending. Final Clinical Impression(s) / ED Diagnoses Final diagnoses:  Fall, initial encounter  Periprosthetic fracture around internal prosthetic knee joint   MDM Number of Diagnoses or Management Options Fall, initial encounter: new, needed workup Periprosthetic fracture around internal prosthetic knee joint: new, needed workup   Amount and/or Complexity of Data Reviewed Clinical lab tests: reviewed Tests in the radiology section of CPT: reviewed Tests in the medicine section of CPT: reviewed Decide to obtain previous medical records or to obtain history from someone other than the patient: yes Obtain history from someone other than the patient: yes Review and summarize past medical records: yes Discuss the patient with other providers: yes Independent visualization of images, tracings, or specimens: yes  Risk of Complications, Morbidity, and/or Mortality Presenting problems: high Diagnostic procedures: high Management options: high  Critical Care Total time providing critical care: < 30 minutes  Patient Progress Patient progress: stable     Carmin Muskrat, MD 10/31/20 1725

## 2020-10-31 NOTE — ED Notes (Signed)
Patient attempted to ambulate however he was very unstable when trying to stand and he stated that the pain was too intense when putting his weight on the injured (left) knee and was unable to fully stand up or ambulate.

## 2020-10-31 NOTE — ED Notes (Signed)
Pt transported to CT ?

## 2020-10-31 NOTE — Plan of Care (Signed)
  Problem: Education: Goal: Knowledge of General Education information will improve Description: Including pain rating scale, medication(s)/side effects and non-pharmacologic comfort measures Outcome: Progressing   Problem: Nutrition: Goal: Adequate nutrition will be maintained Outcome: Progressing   

## 2020-10-31 NOTE — ED Triage Notes (Signed)
Pt bib ems from home. Pt had mechanical fall and tripped over his dog. Denies LOC or hitting head. Pt IS on blood thinners. Fell on his left knee, which was repaired "about 20 years ago" per EMS.   130/50 HR 92 RR 16 95% RA 97.32f

## 2020-10-31 NOTE — H&P (Signed)
History and Physical    Zahir Eisenhour BJY:[id number] DOB: [birth year] DOA: 10/31/2020  PCP: Dorothyann Peng, NP   Patient coming from: Home  I have personally briefly reviewed patient's old medical records in [place]  Chief Complaint: Fall  HPI: Nicholas Porter is a 86 y.o. male with medical history significant of dementia, hypertension, chronic kidney disease stage IV, chronic thrombocytopenia, coronary artery disease, carotid stenosis status post left CEA presented from home with complaints of fall today.  Patient is a poor historian and is currently confused to time.  I have reviewed patient's medical records including ER providers documentation and also have discussed with ER provider.  Apparently, his dog got tangled in his feet, he fell to the ground landing on his left knee.  There is no head trauma or loss of consciousness reported.  Subsequently, he has been having difficulty with weightbearing with increasing left knee pain.  Patient does not elaborate on the nature of his knee pain.  He does not provide any other history provided  ED Course: Left knee x-ray showed no fracture or dislocation.  CT of the left knee showed findings suggestive of an occult intra-articular fracture but no definite periprosthetic fracture.  ED provider consulted orthopedics who recommended admission to the hospital service.  Hospitalist service was called to evaluate the patient.  Review of Systems: Could not be completed because of patient's confusional state.  Past Medical History:  Diagnosis Date  . Acute metabolic encephalopathy 0/07/6577  . Acute renal failure superimposed on stage 4 chronic kidney disease ([place]) 11/27/2015  . Anemia of chronic renal failure, stage 4 (severe) ([place]) 11/27/2015  . Anxiety   . Arthritis    back  . 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 CAD S/P percutaneous coronary  angioplasty - 09/2013   a) 09/2013 - Inf STEMI (Promus DES 3 x 28 DES mRCA); b 05/2014 - UnstAngina - 85% RCA ISR - scoring & [place] balloon PTCA; c) Anterolat STEMI - PTCA only of 99% D1 (small), staged DES (RESOLUTE ONYX DES 3 MM 18 MM) PCI of 95% mRCA lesion after initial stent - Overlapping DES  . Calcific aortic stenosis 10/01/2019   Echo shows functionally bicuspid aortic valve with calcified mild to moderate AS-mean gradient 19 mm, peak 34 mmHg.  Marland Kitchen Cholelithiasis with acute cholecystitis with biliary obstruction 05/2019   Cholecystectomy and biliary stent placed with ERCP  . CKD (chronic kidney disease) stage 3, GFR 30-59 ml/min (HCC)   . COPD (chronic obstructive pulmonary disease) ([place])   . Dementia ([place]) 05/13/2019  . Depression   . Essential hypertension 06/30/2007   Qualifier: Diagnosis of  By: Cori Razor RN, Mikal Plane Eye abnormalities    injections in eyes 12/2017  . H/O Inferior STEMI (09/2013) 10/05/2013   emergent PCI with Promus DES to RCA (3.0 mm  x 28 mm - 3.2 mm)  . History of Anterolateral STEMI 10/01/2019   D1 99% thrombotic lesion-PTCA only.  Had staged PCI of m RCA 95% lesion after prior stent.  . Hypothyroidism   . Klebsiella sepsis ([place]) 04/02/2017  . Left-sided carotid artery disease -- s/p CEA 04/06/2006   S/p Left CEA     Past Surgical History:  Procedure Laterality Date  . AMPUTATION     left index finger -tramatic  . BILIARY STENT PLACEMENT N/A 01/06/2019   Procedure: BILIARY STENT PLACEMENT;  Surgeon: Clarene Essex,  MD;  Location: [facility];  Service: Endoscopy;  Laterality: N/A;  . CAROTID ENDARTERECTOMY Left 2007   Dr.Hayes: 08/2016 Dopplers: Stable less than 40% right internal carotid stenosis and stable moderate 40-60% left internal carotid stenosis. Patent vertebral and subclavian arteries.  . CARPAL TUNNEL RELEASE    . CHOLECYSTECTOMY N/A 01/02/2019   Procedure: LAPAROSCOPIC CHOLECYSTECTOMY;  Surgeon: Michael Boston, MD;  Location: [facility];  Service: General;   Laterality: N/A;  . CORNEAL TRANSPLANT    . CORONARY ANGIOPLASTY WITH STENT PLACEMENT  09/2013; 05/2014    d) Promus DES 3.0 mm x 28 mm (3.2 mm)  to RCA with STEMI; residual 70% mid-distal LAD, OM 270-80%. Medical management; b) PTCA of 85 % mRCA ISR (3.25 mm scoring balloon, 3.5 mm [place] balloon), LAD lesion noted as ~40%  . CORONARY STENT INTERVENTION N/A 10/08/2019   Procedure: CORONARY STENT INTERVENTION;  Surgeon: Wellington Hampshire, MD;  Location: [facility]; ; 95% mRCA after previous stent--overlapping DES PCI: Resolute Onyx 3.0 mm 18 mm  . CORONARY/GRAFT ACUTE MI REVASCULARIZATION N/A 10/01/2019   Procedure: Coronary/Graft Acute MI Revascularization;  Surgeon: Burnell Blanks, MD;  Location: [facility]; ANTEROLATERAL STEMI: PTCA only of 99% subtotal occluded D1;  . ERCP N/A 01/06/2019   Procedure: ENDOSCOPIC RETROGRADE CHOLANGIOPANCREATOGRAPHY (ERCP);  Surgeon: Clarene Essex, MD;  Location: [facility];  Service: Endoscopy;  Laterality: N/A;  . ERCP N/A 05/28/2019   Procedure: ENDOSCOPIC RETROGRADE CHOLANGIOPANCREATOGRAPHY (ERCP);  Surgeon: Clarene Essex, MD;  Location: [facility];  Service: Endoscopy;  Laterality: N/A;  . FOOT SURGERY     left  . HERNIA REPAIR    . LEFT HEART CATH AND CORONARY ANGIOGRAPHY N/A 10/01/2019   Procedure: LEFT HEART CATH AND CORONARY ANGIOGRAPHY;  Surgeon: Burnell Blanks, MD;  Location: [facility];    . LEFT HEART CATH AND CORONARY ANGIOGRAPHY  11/11/2010   Procedure: LEFT HEART CATH AND CORONARY ANGIOGRAPHY;  Surgeon: Leonie Man, MD;  Location: [facility]; 40% mid LAD, 50% proximal RCA, 30-40% distal RCA (DOMINANT RCA with wraparound PDA & 2 large PLBs), 60-70% ostial OM1. No change from 2001. Medical therapy  . LEFT HEART CATHETERIZATION WITH CORONARY ANGIOGRAM N/A 10/05/2013   Procedure: LEFT HEART CATHETERIZATION WITH CORONARY ANGIOGRAM;  Surgeon: Peter M Martinique, MD;  Location: [facility] LAB;  Service:  Cardiovascular; INFERIOR STEMI: RCA 100% - PCI,CxOM stable, LAD ~70% mid;  . LEFT HEART CATHETERIZATION WITH CORONARY ANGIOGRAM N/A 06/08/2014   Procedure: LEFT HEART CATHETERIZATION WITH CORONARY ANGIOGRAM;  Surgeon: Troy Sine, MD;  Location: [facility] LAB; UA 6/'15: 75% ISR in RCA - PTCA, LAD lesion ~40%, CxOM stable.    Marland Kitchen NM MYOVIEW LTD  03/2017    - Elevatred Troponin in setting of Sepsis -- DEMAND ISCHEMIA.  Normal perfusion. LVEF 51% with normal wall motion. This is a low risk study.  Marland Kitchen PARTIAL GASTRECTOMY    . PERCUTANEOUS CORONARY INTERVENTION-BALLOON ONLY  06/08/2014   Procedure: PERCUTANEOUS CORONARY INTERVENTION-BALLOON ONLY;  Surgeon: Troy Sine, MD;  Location: [facility] CATH LAB;  Service: Cardiovascular;;  . REMOVAL OF STONES  05/28/2019   Procedure: REMOVAL OF STONES;  Surgeon: Clarene Essex, MD;  Location: [facility];  Service: Endoscopy;;  . ROTATOR CUFF REPAIR     left  . SHOULDER ARTHROSCOPY WITH ROTATOR CUFF REPAIR AND SUBACROMIAL DECOMPRESSION Right 07/01/2013   Procedure: RIGHT SHOULDER ARTHROSCOPY WITH  SUBACROMIAL DECOMPRESSION/DISTAL CLAVICLE RESECTION/ROTATOR CUFF REPAIR ;  Surgeon: Marin Shutter,  MD;  Location: [facility];  Service: Orthopedics;  Laterality: Right;  . SPHINCTEROTOMY  01/06/2019   Procedure: SPHINCTEROTOMY;  Surgeon: Clarene Essex, MD;  [facility];  Service: Endoscopy;;  . SPINE SURGERY  1985   cervical laminectomy  . STENT REMOVAL  05/28/2019   Procedure: STENT REMOVAL;  Surgeon: Clarene Essex, MD;  Location: [facility];  Service: Endoscopy;;  . TOTAL KNEE ARTHROPLASTY    . TRANSTHORACIC ECHOCARDIOGRAM  09/'17; 4/'18; 1/'20   a) Normal EF 55-60%. Mild to moderate AS with mod AI. Mild MR.;; b) Mild global reduction in LV systolic function: 19-62%; grade 1 DD - high LVEDP. Mild AS/AI/TR. Mild-Mod MR; moderately elevated PAP.;; c) TTE 12/2018: EF 55-60%. No RWMA. Gr1 DD. Mod AS with mild AI. Severe LA dilation.  . TRANSTHORACIC ECHOCARDIOGRAM  10/01/2019     ANTEROLATERAL STEMI: EF 45 to 50%.  GR 1 DD.  Normal atrial sizes.  Mild mitral stenosis..  Functionally bicuspid aortic valve with moderate AS (mean gradient 19 mmHg, peak 34 mmHg).  Marland Kitchen VAGOTOMY     partial gastrectomy   Social history: As per prior records  reports that he quit smoking about 55 years ago. His smoking use included cigarettes. He quit after 15.00 years of use. He has never used smokeless tobacco. He reports that he does not drink alcohol and does not use drugs.  Allergies  Allergen Reactions  . Amlodipine Besylate Hives  . Lipitor [Atorvastatin] Itching  . Naproxen Diarrhea  . Oxycodone-Acetaminophen Itching  . Isosorbide Rash    Family History  Problem Relation Age of Onset  . Heart disease Sister        Heart Transplant   . Diabetes Sister     Prior to Admission medications   Medication Sig Start Date End Date Taking? Authorizing Provider  clopidogrel (PLAVIX) 75 MG tablet Take 1 tablet (75 mg total) by mouth daily. Patient taking differently: Take 75 mg by mouth 2 (two) times a week. No specific days 02/23/20  Yes Leonie Man, MD  latanoprost (XALATAN) 0.005 % ophthalmic solution Place 1 drop into both eyes at bedtime.    Yes [provider]  hydrALAZINE (APRESOLINE) 25 MG tablet Take 25 mg tablet if blood pressure is equal or greater than 170 ( top number) up to 3 three times a day if needed Patient not taking: Reported on 10/31/2020 08/29/20   Leonie Man, MD    Physical Exam: Vitals:   10/31/20 1054 10/31/20 1200 10/31/20 1230 10/31/20 1531  BP:  (!) 154/60 (!) 164/64 (!) 192/77  Pulse:  66 63 63  Resp:  (!) 21 17 16   Temp:      TempSrc:      SpO2:  100% 99% 99%  Weight: 40.8 kg     Height: 5\' 7"  (1.702 m)       Constitutional: Elderly male lying in bed, extremely thinly built.  No acute distress. Vitals:   10/31/20 1054 10/31/20 1200 10/31/20 1230 10/31/20 1531  BP:  (!) 154/60 (!) 164/64 (!) 192/77  Pulse:  66 63 63  Resp:   (!) 21 17 16   Temp:      TempSrc:      SpO2:  100% 99% 99%  Weight: 40.8 kg     Height: 5\' 7"  (1.702 m)      Eyes: PERRL, lids and conjunctivae normal ENMT: Mucous membranes are dry.  Posterior pharynx clear of any exudate or lesions. Neck: normal, supple, no masses, no thyromegaly  Respiratory: bilateral decreased breath sounds at bases, no wheezing, no crackles. Normal respiratory effort. No accessory muscle use.  Cardiovascular: S1 S2 positive, rate controlled. No extremity edema. 2+ pedal pulses.  Abdomen: no tenderness, no masses palpated. No hepatosplenomegaly. Bowel sounds positive.  Musculoskeletal: no clubbing / cyanosis.  Left knee is wrapped; slightly tender with decreased range of motion with tender to palpation.   Skin: no rashes, lesions, ulcers. No induration Neurologic: CN 2-12 grossly intact. Moving extremities. No focal neurologic deficits.  Poor historian.  Awake but confused to time. Psychiatric: Flat affect.  Awake but confused to time.   Labs on Admission: I have personally reviewed following labs and imaging studies  CBC: Recent Labs  Lab 10/31/20 1105  WBC 4.0  NEUTROABS 2.5  HGB 12.1*  HCT 36.7*  MCV 99.2  PLT 66*   Basic Metabolic Panel: Recent Labs  Lab 10/31/20 1105  NA 138  K 4.6  CL 113*  CO2 18*  GLUCOSE 130*  BUN 53*  CREATININE 2.13*  CALCIUM 9.0   GFR: Estimated Creatinine Clearance: 13.8 mL/min (A) (by C-G formula based on SCr of 2.13 mg/dL (H)). Liver Function Tests: No results for input(s): AST, ALT, ALKPHOS, BILITOT, PROT, ALBUMIN in the last 168 hours. No results for input(s): LIPASE, AMYLASE in the last 168 hours. No results for input(s): AMMONIA in the last 168 hours. Coagulation Profile: Recent Labs  Lab 10/31/20 1105  INR 1.1   Cardiac Enzymes: No results for input(s): CKTOTAL, CKMB, CKMBINDEX, TROPONINI in the last 168 hours. BNP (last 3 results) No results for input(s): PROBNP in the last 8760 hours. HbA1C: No  results for input(s): HGBA1C in the last 72 hours. CBG: No results for input(s): GLUCAP in the last 168 hours. Lipid Profile: No results for input(s): CHOL, HDL, LDLCALC, TRIG, CHOLHDL, LDLDIRECT in the last 72 hours. Thyroid Function Tests: No results for input(s): TSH, T4TOTAL, FREET4, T3FREE, THYROIDAB in the last 72 hours. Anemia Panel: No results for input(s): VITAMINB12, FOLATE, FERRITIN, TIBC, IRON, RETICCTPCT in the last 72 hours. Urine analysis:    Component Value Date/Time   COLORURINE YELLOW 10/31/2020 1657   APPEARANCEUR CLOUDY (A) 10/31/2020 1657   LABSPEC 1.013 10/31/2020 1657   PHURINE 5.0 10/31/2020 1657   GLUCOSEU NEGATIVE 10/31/2020 1657   GLUCOSEU NEGATIVE 04/15/2017 1718   HGBUR SMALL (A) 10/31/2020 1657   HGBUR negative 12/06/2009 0836   BILIRUBINUR NEGATIVE 10/31/2020 1657   BILIRUBINUR neg 03/02/2019 1541   KETONESUR NEGATIVE 10/31/2020 1657   PROTEINUR NEGATIVE 10/31/2020 1657   UROBILINOGEN 0.2 03/02/2019 1541   UROBILINOGEN 0.2 04/15/2017 1718   NITRITE NEGATIVE 10/31/2020 1657   LEUKOCYTESUR LARGE (A) 10/31/2020 1657    Radiological Exams on Admission: CT Knee Left Wo Contrast  Result Date: 10/31/2020 CLINICAL DATA:  Fall, left knee pain EXAM: CT OF THE LEFT KNEE WITHOUT CONTRAST TECHNIQUE: Multidetector CT imaging of the left knee was performed according to the standard protocol. Multiplanar CT image reconstructions were also generated. Metal artifact reduction protocol was utilized. COMPARISON:  X-ray 10/31/2020 FINDINGS: Bones/Joint/Cartilage Status post left total knee arthroplasty. Arthroplasty components are in their expected alignment. No definite periprosthetic fracture is identified. No area of periprosthetic lucency is seen. There is metallic streak artifact, which slightly limits evaluation of the adjacent structures. Small suprapatellar knee joint effusion with layering fat-fluid level (series 6, image 27). There is synovial thickening.  Ligaments Suboptimally assessed by CT. Muscles and Tendons No evidence of acute musculotendinous injury by CT. Soft tissues No  soft tissue swelling or fluid collection. Advanced atherosclerotic calcifications. IMPRESSION: There is a small suprapatellar knee joint effusion with layering fat-fluid level, compatible with a lipohemarthrosis. Findings are suggestive of an occult intra-articular fracture. No discrete fracture is identified within the limitations of this exam. Electronically Signed   By: Davina Poke D.O.   On: 10/31/2020 16:42   DG Knee Complete 4 Views Left  Result Date: 10/31/2020 CLINICAL DATA:  Fall. EXAM: LEFT KNEE - COMPLETE 4+ VIEW COMPARISON:  None. FINDINGS: No evidence of acute fracture or dislocation. Small joint effusion. Total knee arthroplasty. No evidence of hardware complication. Calcific atherosclerosis. IMPRESSION: 1. No evidence of acute fracture or dislocation. 2. Small joint effusion. Electronically Signed   By: Margaretha Sheffield MD   On: 10/31/2020 11:43     Assessment/Plan  Left knee pain after mechanical fall with concern for occult intra-articular fracture -No definite periprosthetic fracture seen on CT of the knee.  ER provider spoke to orthopedics on-call/Dr. Swinteck who recommended admission to hospitalist service.  Orthopedics will consult the patient in a.m.  Unclear if the patient would be a surgical candidate but will keep the patient n.p.o. past midnight. -Fall precautions.  Pain management. -PT/OT eval  Hypertension -Blood pressure elevated currently.  Will use as needed antihypertensives.  Currently not taking any antihypertensives at home (apparently prescribed as needed hydralazine).  Chronic kidney disease stage IV -Creatinine currently at baseline which is around 2.2-2.5 -Monitor renal function  Chronic thrombocytopenia -Questionable cause.  No signs of bleeding currently.  Monitor  History of coronary artery disease -Denies any chest  pain.  Follows up with cardiology as an outpatient.  Hold Plavix.  Abnormal urinalysis -No evidence of UTI.  Will monitor off antibiotics  Dementia -Monitor mental status.  Fall precautions.  Delirium precautions. -Overall prognosis is very poor.  Patient is active with [facility] which is home-based [facility] [place].  Continue outpatient follow-up with palliative care    DVT prophylaxis: SCDs.  Avoid Lovenox/heparin because of thrombocytopenia Code Status: DNR Family Communication: Wife at bedside Disposition Plan: Possible home in 1 to 3 days pending orthopedics eval and PT input Consults called: Orthopedics called by ED provider.  ED provider stated that he spoke to Dr. Lyla Glassing Admission status: Inpatient/MedSurg  Severity of Illness: The appropriate patient status for this patient is INPATIENT. Inpatient status is judged to be reasonable and necessary in order to provide the required intensity of service to ensure the patient's safety. The patient's presenting symptoms, physical exam findings, and initial radiographic and laboratory data in the context of their chronic comorbidities is felt to place them at high risk for further clinical deterioration. Furthermore, it is not anticipated that the patient will be medically stable for discharge from the hospital within 2 midnights of admission. The following factors support the patient status of inpatient.   " The patient's presenting symptoms include left knee pain/mechanical fall. " The worrisome physical exam findings include left knee tenderness and decreased range of motion. " The initial radiographic and laboratory data are worrisome because of possible occult left knee intra-articular fracture " The chronic co-morbidities include dementia/chronic kidney disease stage IV/hypertension/chronic thrombocytopenia.   * I certify that at the point of admission it is my clinical judgment that the patient  will require inpatient hospital care spanning beyond 2 midnights from the point of admission due to high intensity of service, high risk for further deterioration and high frequency of surveillance required.*  Aline August MD [facility]  10/31/2020, 5:39 PM

## 2020-10-31 NOTE — Progress Notes (Addendum)
   Pt is active with [facility]--the home based [place].  He was admitted to Palliative Care for atherosclerotic heart dz.  He lives at home with wife-[name].  Pt is followed by [facility] nurse and social worker.  Will plan to resume home support of pt/family upon discharge.  Please call [facility] if we can assist with d/c planning.   Wynetta Fines, RN  Cell--[phone] Office --[phone]

## 2020-11-01 DIAGNOSIS — M25562 Pain in left knee: Secondary | ICD-10-CM | POA: Diagnosis not present

## 2020-11-01 DIAGNOSIS — I2510 Atherosclerotic heart disease of native coronary artery without angina pectoris: Secondary | ICD-10-CM | POA: Diagnosis not present

## 2020-11-01 DIAGNOSIS — N184 Chronic kidney disease, stage 4 (severe): Secondary | ICD-10-CM | POA: Diagnosis not present

## 2020-11-01 DIAGNOSIS — I10 Essential (primary) hypertension: Secondary | ICD-10-CM | POA: Diagnosis not present

## 2020-11-01 LAB — COMPREHENSIVE METABOLIC PANEL
ALT: 15 U/L (ref 0–44)
AST: 15 U/L (ref 15–41)
Albumin: 3.3 g/dL — ABNORMAL LOW (ref 3.5–5.0)
Alkaline Phosphatase: 81 U/L (ref 38–126)
Anion gap: 9 (ref 5–15)
BUN: 50 mg/dL — ABNORMAL HIGH (ref 8–23)
CO2: 19 mmol/L — ABNORMAL LOW (ref 22–32)
Calcium: 9.1 mg/dL (ref 8.9–10.3)
Chloride: 114 mmol/L — ABNORMAL HIGH (ref 98–111)
Creatinine, Ser: 1.9 mg/dL — ABNORMAL HIGH (ref 0.61–1.24)
GFR, Estimated: 34 mL/min — ABNORMAL LOW (ref >60)
Glucose, Bld: 86 mg/dL (ref 70–99)
Potassium: 5.4 mmol/L — ABNORMAL HIGH (ref 3.5–5.1)
Sodium: 142 mmol/L (ref 135–145)
Total Bilirubin: 0.6 mg/dL (ref 0.3–1.2)
Total Protein: 6.3 g/dL — ABNORMAL LOW (ref 6.5–8.1)

## 2020-11-01 LAB — CBC
HCT: 33.5 % — ABNORMAL LOW (ref 39.0–52.0)
Hemoglobin: 11.4 g/dL — ABNORMAL LOW (ref 13.0–17.0)
MCH: 33.2 pg (ref 26.0–34.0)
MCHC: 34 g/dL (ref 30.0–36.0)
MCV: 97.7 fL (ref 80.0–100.0)
Platelets: 66 10*3/uL — ABNORMAL LOW (ref 150–400)
RBC: 3.43 MIL/uL — ABNORMAL LOW (ref 4.22–5.81)
RDW: 13.8 % (ref 11.5–15.5)
WBC: 4 10*3/uL (ref 4.0–10.5)
nRBC: 0 % (ref 0.0–0.2)

## 2020-11-01 LAB — PROTIME-INR
INR: 1 (ref 0.8–1.2)
Prothrombin Time: 12.9 seconds (ref 11.4–15.2)

## 2020-11-01 MED ORDER — SODIUM ZIRCONIUM CYCLOSILICATE 10 G PO PACK
10.0000 g | PACK | Freq: Two times a day (BID) | ORAL | Status: DC
  Administered 2020-11-01 – 2020-11-02 (×3): 10 g via ORAL
  Filled 2020-11-01 (×5): qty 1

## 2020-11-01 MED ORDER — HYDRALAZINE HCL 10 MG PO TABS
10.0000 mg | ORAL_TABLET | Freq: Three times a day (TID) | ORAL | Status: DC
  Administered 2020-11-01 – 2020-11-02 (×3): 10 mg via ORAL
  Filled 2020-11-01 (×7): qty 1

## 2020-11-01 NOTE — Progress Notes (Signed)
PROGRESS NOTE  Nicholas Porter  DTO:[id number] DOB: 1930/03/20 DOA: 10/31/2020 PCP: Dorothyann Peng, NP   Brief Narrative: Nicholas Porter is an 86 y.o. male with a history of dementia, HTN, stage IC CKD, chronic thrombocytopenia, CAD, carotid stenosis s/p left CEA who presented from home after a fall onto the left knee at home after tripping over/being pushed by his dog. He had an inability to bear weight thereafter. Left knee XR showed no fracture, though subsequent CT revealed an occult intra-articular fracture. He was evaluated by orthopedics who recommended a knee immobilizer, TDWB, and outpatient follow up in 2 weeks, but no operative management planned. SNF has been recommended by PT and OT which is being pursued.   Assessment & Plan: Principal Problem:   Knee pain Active Problems:   Essential hypertension   Thrombocytopenia (HCC)   Anemia of chronic renal failure, stage 4 (severe) (HCC)   CKD (chronic kidney disease) stage 4, GFR 15-29 ml/min (HCC)   CAD (coronary artery disease)  Left intraarticular knee fracture after fall at home:  - D/w orthopedics PA, recommendation is TDWB in knee immobilizer, PT/OT, and 2 week follow up with himself or Dr. Lyla Glassing.  - Pursue SNF for continued PT/OT.   HTN: With widened pulse pressure suggestive of systemic atherosclerotic burden.  - Schedule hydralazine low dose given persistent elevations, though he's only taken this prn at home.   CAD:  - Can restart plavix.   Carotid stenosis s/p CEA:  - Restart plavix  Dementia: Oriented completely this morning.  - Delirium precautions  Chronic thrombocytopenia:  - Monitor intermittently as it is stable.   Stage IV CKD: Near baseline - Avoid nephrotoxins  Hyperkalemia: Pt reports significant banana intake.  - Lokelma in setting of CKD. Recheck in AM, avoid high K foods.   Underweight: Estimated body mass index is 14.1 kg/m as calculated from the following:   Height as of this encounter: 5'  7" (1.702 m).   Weight as of this encounter: 40.8 kg.  DVT prophylaxis: SCDs Code Status: DNR Family Communication: Wife at bedside Disposition Plan:  Status is: Inpatient  Remains inpatient appropriate because:Unsafe d/c plan  Dispo: The patient is from: Home              Anticipated d/c is to: SNF              Anticipated d/c date is: 1 day              Patient currently is not medically stable to d/c.  Consultants:   Orthopedics  Procedures:   None  Antimicrobials:  None   Subjective: Pain in left knee is none at rest, moderate with weight bearing now. No palpitations, chest pain, leg swelling, dyspnea, or syncope.   Objective: Vitals:   11/01/20 1000 11/01/20 1040 11/01/20 1433 11/01/20 1735  BP: (!) 171/92 (!) 172/67 (!) 143/70 (!) 149/64  Pulse: 61 (!) 55 73 76  Resp: 14 16 15 14   Temp: 98.1 F (36.7 C)  98.4 F (36.9 C) 98.8 F (37.1 C)  TempSrc: Oral  Oral Oral  SpO2: 100%  100% 100%  Weight:      Height:        Intake/Output Summary (Last 24 hours) at 11/01/2020 1903 Last data filed at 11/01/2020 1804 Gross per 24 hour  Intake 1474.75 ml  Output 1175 ml  Net 299.75 ml   Filed Weights   10/31/20 1054  Weight: 40.8 kg    Gen: Thin elderly male  in no distress Pulm: Non-labored breathing. Clear to auscultation bilaterally.  CV: Regular rate and rhythm. Systolic murmur noted, no rub, or gallop. No JVD, no pitting pedal edema. GI: Abdomen soft, non-tender, non-distended, with normoactive bowel sounds. No organomegaly or masses felt. Ext: Warm, no deformities, decreased muscle bulk. Skin: No rashes, lesions or ulcers Neuro: Alert and oriented. No focal neurological deficits. Psych: Judgement and insight appear normal. Mood & affect appropriate.   Data Reviewed: I have personally reviewed following labs and imaging studies  CBC: Recent Labs  Lab 10/31/20 1105 11/01/20 0631  WBC 4.0 4.0  NEUTROABS 2.5  --   HGB 12.1* 11.4*  HCT 36.7* 33.5*   MCV 99.2 97.7  PLT 66* 66*   Basic Metabolic Panel: Recent Labs  Lab 10/31/20 1105 11/01/20 0631  NA 138 142  K 4.6 5.4*  CL 113* 114*  CO2 18* 19*  GLUCOSE 130* 86  BUN 53* 50*  CREATININE 2.13* 1.90*  CALCIUM 9.0 9.1   GFR: Estimated Creatinine Clearance: 15.5 mL/min (A) (by C-G formula based on SCr of 1.9 mg/dL (H)). Liver Function Tests: Recent Labs  Lab 11/01/20 0631  AST 15  ALT 15  ALKPHOS 81  BILITOT 0.6  PROT 6.3*  ALBUMIN 3.3*   No results for input(s): LIPASE, AMYLASE in the last 168 hours. No results for input(s): AMMONIA in the last 168 hours. Coagulation Profile: Recent Labs  Lab 10/31/20 1105 11/01/20 0631  INR 1.1 1.0   Cardiac Enzymes: No results for input(s): CKTOTAL, CKMB, CKMBINDEX, TROPONINI in the last 168 hours. BNP (last 3 results) No results for input(s): PROBNP in the last 8760 hours. HbA1C: No results for input(s): HGBA1C in the last 72 hours. CBG: No results for input(s): GLUCAP in the last 168 hours. Lipid Profile: No results for input(s): CHOL, HDL, LDLCALC, TRIG, CHOLHDL, LDLDIRECT in the last 72 hours. Thyroid Function Tests: No results for input(s): TSH, T4TOTAL, FREET4, T3FREE, THYROIDAB in the last 72 hours. Anemia Panel: No results for input(s): VITAMINB12, FOLATE, FERRITIN, TIBC, IRON, RETICCTPCT in the last 72 hours. Urine analysis:    Component Value Date/Time   COLORURINE YELLOW 10/31/2020 1657   APPEARANCEUR CLOUDY (A) 10/31/2020 1657   LABSPEC 1.013 10/31/2020 1657   PHURINE 5.0 10/31/2020 1657   GLUCOSEU NEGATIVE 10/31/2020 1657   GLUCOSEU NEGATIVE 04/15/2017 1718   HGBUR SMALL (A) 10/31/2020 1657   HGBUR negative 12/06/2009 0836   BILIRUBINUR NEGATIVE 10/31/2020 1657   BILIRUBINUR neg 03/02/2019 1541   KETONESUR NEGATIVE 10/31/2020 1657   PROTEINUR NEGATIVE 10/31/2020 1657   UROBILINOGEN 0.2 03/02/2019 1541   UROBILINOGEN 0.2 04/15/2017 1718   NITRITE NEGATIVE 10/31/2020 1657   LEUKOCYTESUR LARGE (A)  10/31/2020 1657   No results found for this or any previous visit (from the past 240 hour(s)).    Radiology Studies: CT Knee Left Wo Contrast  Result Date: 10/31/2020 CLINICAL DATA:  Fall, left knee pain EXAM: CT OF THE LEFT KNEE WITHOUT CONTRAST TECHNIQUE: Multidetector CT imaging of the left knee was performed according to the standard protocol. Multiplanar CT image reconstructions were also generated. Metal artifact reduction protocol was utilized. COMPARISON:  X-ray 10/31/2020 FINDINGS: Bones/Joint/Cartilage Status post left total knee arthroplasty. Arthroplasty components are in their expected alignment. No definite periprosthetic fracture is identified. No area of periprosthetic lucency is seen. There is metallic streak artifact, which slightly limits evaluation of the adjacent structures. Small suprapatellar knee joint effusion with layering fat-fluid level (series 6, image 27). There is synovial thickening. Ligaments  Suboptimally assessed by CT. Muscles and Tendons No evidence of acute musculotendinous injury by CT. Soft tissues No soft tissue swelling or fluid collection. Advanced atherosclerotic calcifications. IMPRESSION: There is a small suprapatellar knee joint effusion with layering fat-fluid level, compatible with a lipohemarthrosis. Findings are suggestive of an occult intra-articular fracture. No discrete fracture is identified within the limitations of this exam. Electronically Signed   By: Davina Poke D.O.   On: 10/31/2020 16:42   DG Knee Complete 4 Views Left  Result Date: 10/31/2020 CLINICAL DATA:  Fall. EXAM: LEFT KNEE - COMPLETE 4+ VIEW COMPARISON:  None. FINDINGS: No evidence of acute fracture or dislocation. Small joint effusion. Total knee arthroplasty. No evidence of hardware complication. Calcific atherosclerosis. IMPRESSION: 1. No evidence of acute fracture or dislocation. 2. Small joint effusion. Electronically Signed   By: Margaretha Sheffield MD   On: 10/31/2020 11:43     Scheduled Meds: . hydrALAZINE  10 mg Oral Q8H  . latanoprost  1 drop Both Eyes QHS  . sodium zirconium cyclosilicate  10 g Oral BID   Continuous Infusions:   LOS: 1 day   Time spent: 25 minutes.  Patrecia Pour, MD [facility] [email or web address] 11/01/2020, 7:03 PM

## 2020-11-01 NOTE — Progress Notes (Signed)
Pt refused am labs.

## 2020-11-01 NOTE — Progress Notes (Signed)
Orthopedic Tech Progress Note Patient Details:  Nicholas Porter 06/03/1930 [id number]  Ortho Devices Type of Ortho Device: Knee Immobilizer Ortho Device/Splint Interventions: Application   Post Interventions Patient Tolerated: Well Instructions Provided: Care of device   [name] 11/01/2020, 1:22 PM

## 2020-11-01 NOTE — Progress Notes (Signed)
On second attempt pt allowed am labs to be drawn

## 2020-11-01 NOTE — Evaluation (Signed)
Occupational Therapy Evaluation Patient Details Name: Nicholas Porter MRN: [id number] DOB: 07-29-1930 Today's Date: 11/01/2020    History of Present Illness Nicholas Porter is a 86 y.o. male who complains of left knee pain secondary to a fall. The patient has past medical history significant for dementia, hypertension, chronic kidney disease stage IV, chronic thrombocytopenia, coronary artery disease, and carotid stenosis status post left CEA.   Clinical Impression   [name] is an 86 year old man who presents with decreased ROM and strength of LLE, weight bearing restrictions, reports of pain in left knee and decreased activity tolerance resulting in a decline in ability to perform independent ADLs and safe mobility. Patient min assist for ambulation with RW, max assist for LB dressing and toileting, and decreased ability to tolerate prolonged mobility. Patient will benefit from skilled OT services while in hospital to improve deficits and learn compensatory strategies as needed in order to return to PLOF.  Recommend short term rehab at discharge.     Follow Up Recommendations  SNF    Equipment Recommendations   (defer to next venue)    Recommendations for Other Services       Precautions / Restrictions Precautions Precautions: Fall Required Braces or Orthoses: Knee Immobilizer - Left Restrictions Weight Bearing Restrictions: Yes LLE Weight Bearing: Touchdown weight bearing      Mobility Bed Mobility Overal bed mobility: Needs Assistance Bed Mobility: Supine to Sit     Supine to sit: Supervision;HOB elevated          Transfers Overall transfer level: Needs assistance Equipment used: Rolling walker (2 wheeled) Transfers: Sit to/from Stand Sit to Stand: Min assist         General transfer comment: Min assist for all ambulation with RW for steadying and safety. Patient able to maintain weight bearing status. Decreased activity tolerance.    Balance Overall  balance assessment: Needs assistance Sitting-balance support: No upper extremity supported;Feet supported Sitting balance-Leahy Scale: Good     Standing balance support: During functional activity;Bilateral upper extremity supported Standing balance-Leahy Scale: Poor                             ADL either performed or assessed with clinical judgement   ADL Overall ADL's : Needs assistance/impaired Eating/Feeding: Independent   Grooming: Set up;Sitting   Upper Body Bathing: Set up;Sitting   Lower Body Bathing: Minimal assistance;Sit to/from stand;Set up   Upper Body Dressing : Set up;Sitting   Lower Body Dressing: Total assistance;Sit to/from stand   Toilet Transfer: Minimal assistance;RW;Grab Haematologist;Ambulation Toilet Transfer Details (indicate cue type and reason): MIn assist for steadying to ambulate to bathroom. min assist for toilet transfers and verbal cues for hand placement and to extend LLE Toileting- Clothing Manipulation and Hygiene: Maximal assistance Toileting - Clothing Manipulation Details (indicate cue type and reason): Max assist for clothing management. Patient able to perform hygiene in seated position.     Functional mobility during ADLs: Minimal assistance;Rolling walker       Vision Patient Visual Report: No change from baseline Additional Comments: Reports he is going blind.     Perception     Praxis      Pertinent Vitals/Pain Pain Assessment: No/denies pain     Hand Dominance Right   Extremity/Trunk Assessment Upper Extremity Assessment Upper Extremity Assessment: Overall WFL for tasks assessed   Lower Extremity Assessment Lower Extremity Assessment: Defer to PT evaluation   Cervical / Trunk  Assessment Cervical / Trunk Assessment: Normal   Communication Communication Communication: HOH   Cognition Arousal/Alertness: Awake/alert Behavior During Therapy: WFL for tasks assessed/performed Overall Cognitive  Status: Within Functional Limits for tasks assessed                                 General Comments: Hx of dementia. Able to follow commands and recall recent events. Knows the month and year.   General Comments       Exercises     Shoulder Instructions      Home Living Family/patient expects to be discharged to:: Skilled nursing facility Living Arrangements: Spouse/significant other Available Help at Discharge: Family Type of Home: House Home Access: Stairs to enter [employer] of Steps: 2   Home Layout: One level     Bathroom Shower/Tub: Teacher, early years/pre: Standard     Home Equipment: Environmental consultant - 4 wheels;Bedside commode          Prior Functioning/Environment Level of Independence: Independent                 OT Problem List: Decreased activity tolerance;Impaired balance (sitting and/or standing);Decreased knowledge of use of DME or AE;Decreased knowledge of precautions;Pain;Decreased cognition      OT Treatment/Interventions: Self-care/ADL training;Therapeutic exercise;DME and/or AE instruction;Therapeutic activities;Patient/family education;Balance training    OT Goals(Current goals can be found in the care plan section) Acute Rehab OT Goals Patient Stated Goal: Wants tod rive OT Goal Formulation: With patient Time For Goal Achievement: 11/15/20 Potential to Achieve Goals: Good  OT Frequency: Min 2X/week   Barriers to D/C:            Co-evaluation PT/OT/SLP Co-Evaluation/Treatment: Yes Reason for Co-Treatment: For patient/therapist safety;To address functional/ADL transfers PT goals addressed during session: Mobility/safety with mobility OT goals addressed during session: ADL's and self-care      AM-PAC OT "6 Clicks" Daily Activity     Outcome Measure Help from another person eating meals?: None Help from another person taking care of personal grooming?: A Little Help from another person toileting, which  includes using toliet, bedpan, or urinal?: A Lot Help from another person bathing (including washing, rinsing, drying)?: A Little Help from another person to put on and taking off regular upper body clothing?: A Little Help from another person to put on and taking off regular lower body clothing?: A Lot 6 Click Score: 17   End of Session Equipment Utilized During Treatment: Rolling walker;Gait belt Nurse Communication: Mobility status  Activity Tolerance: Patient tolerated treatment well Patient left: in chair;with call bell/phone within reach;with chair alarm set;with family/visitor present  OT Visit Diagnosis: Other abnormalities of gait and mobility (R26.89);Unsteadiness on feet (R26.81);Pain Pain - Right/Left: Left Pain - part of body: Knee                Time: 1347-1410 OT Time Calculation (min): 23 min Charges:  OT General Charges $OT Visit: 1 Visit OT Evaluation $OT Eval Low Complexity: 1 Low  Dawnmarie Breon, OTR/L [place] [phone] Pager: [place] 11/01/2020, 2:35 PM

## 2020-11-01 NOTE — Evaluation (Signed)
Physical Therapy Evaluation Patient Details Name: Nicholas Porter MRN: [id number] DOB: 1930-03-03 Today's Date: 11/01/2020   History of Present Illness  Nicholas Porter is a 86 y.o. male who complains of left knee pain secondary to a fall. The patient has past medical history significant for dementia, hypertension, chronic kidney disease stage IV, chronic thrombocytopenia, coronary artery disease, and carotid stenosis status post left CEA.  Pt diagnosed with "Lipohemarthrosis of the left knee suggestive for occult intra-articular fracture" per ortho note.  Clinical Impression  Pt admitted with above diagnosis.  Pt currently with functional limitations due to the deficits listed below (see PT Problem List). Pt will benefit from skilled PT to increase their independence and safety with mobility to allow discharge to the venue listed below.  Pt voices frustration over brace and weight bearing.  Pt reports brace too heavy and big.  Pt requested to leave brace off at night (asked RN to check if pt can remove for sleeping) however pt aware he needs to keep KI brace on at all times right now.  Spouse present and states she cannot assist pt at home in current condition.  Pt typically drives since spouse has vestibular issues however pt reports decreased vision.  Pt would benefit from SNF upon d/c.      Follow Up Recommendations SNF    Equipment Recommendations  Rolling walker with 5" wheels    Recommendations for Other Services       Precautions / Restrictions Precautions Precautions: Fall Required Braces or Orthoses: Knee Immobilizer - Left Knee Immobilizer - Left: On at all times Restrictions Weight Bearing Restrictions: Yes LLE Weight Bearing: Touchdown weight bearing      Mobility  Bed Mobility Overal bed mobility: Needs Assistance Bed Mobility: Supine to Sit     Supine to sit: Supervision;HOB elevated          Transfers Overall transfer level: Needs assistance Equipment used:  Rolling walker (2 wheeled) Transfers: Sit to/from Stand Sit to Stand: Min assist         General transfer comment: Min assist for rise, steady and especially controlling descent, cues for hand placement  Ambulation/Gait Ambulation/Gait assistance: Min assist Gait Distance (Feet): 25 Feet (total) Assistive device: Rolling walker (2 wheeled)       General Gait Details: Min assist for all ambulation with RW for steadying and safety. Patient able to maintain weight bearing status. Pt assisted with ambulating to/from bathroom and fatigue quickly  Stairs            Wheelchair Mobility    Modified Rankin (Stroke Patients Only)       Balance Overall balance assessment: History of Falls Sitting-balance support: No upper extremity supported;Feet supported Sitting balance-Leahy Scale: Good     Standing balance support: During functional activity;Bilateral upper extremity supported Standing balance-Leahy Scale: Poor                               Pertinent Vitals/Pain Pain Assessment: No/denies pain    Home Living Family/patient expects to be discharged to:: Skilled nursing facility Living Arrangements: Spouse/significant other Available Help at Discharge: Family Type of Home: House Home Access: Stairs to enter   [employer] of Steps: 2 Home Layout: One level Home Equipment: Walker - 4 wheels;Bedside commode      Prior Function Level of Independence: Independent         Comments: still drives     Hand Dominance  Dominant Hand: Right    Extremity/Trunk Assessment   Upper Extremity Assessment Upper Extremity Assessment: Overall WFL for tasks assessed    Lower Extremity Assessment Lower Extremity Assessment: Generalized weakness;LLE deficits/detail (pt reports being underweight, decreased muscle mass throughout) LLE Deficits / Details: maintained KI    Cervical / Trunk Assessment Cervical / Trunk Assessment: Normal   Communication   Communication: HOH  Cognition Arousal/Alertness: Awake/alert Behavior During Therapy: WFL for tasks assessed/performed Overall Cognitive Status: History of cognitive impairments - at baseline                                 General Comments: Hx of dementia. Able to follow commands and recall recent events. Knows the month and year.      General Comments      Exercises     Assessment/Plan    PT Assessment Patient needs continued PT services  PT Problem List Decreased strength;Decreased mobility;Decreased activity tolerance;Decreased balance;Decreased knowledge of use of DME;Decreased cognition;Decreased safety awareness       PT Treatment Interventions DME instruction;Gait training;Functional mobility training;Therapeutic activities;Patient/family education;Balance training;Therapeutic exercise    PT Goals (Current goals can be found in the Care Plan section)  Acute Rehab PT Goals Patient Stated Goal: Wants tod rive PT Goal Formulation: With patient/family Time For Goal Achievement: 11/15/20 Potential to Achieve Goals: Good    Frequency Min 2X/week   Barriers to discharge        Co-evaluation PT/OT/SLP Co-Evaluation/Treatment: Yes Reason for Co-Treatment: For patient/therapist safety;To address functional/ADL transfers PT goals addressed during session: Mobility/safety with mobility OT goals addressed during session: ADL's and self-care       AM-PAC PT "6 Clicks" Mobility  Outcome Measure Help needed turning from your back to your side while in a flat bed without using bedrails?: A Little Help needed moving from lying on your back to sitting on the side of a flat bed without using bedrails?: A Little Help needed moving to and from a bed to a chair (including a wheelchair)?: A Little Help needed standing up from a chair using your arms (e.g., wheelchair or bedside chair)?: A Little Help needed to walk in hospital room?: A Little Help  needed climbing 3-5 steps with a railing? : A Lot 6 Click Score: 17    End of Session Equipment Utilized During Treatment: Gait belt Activity Tolerance: Patient limited by fatigue Patient left: in chair;with call bell/phone within reach;with chair alarm set Nurse Communication: Mobility status PT Visit Diagnosis: Difficulty in walking, not elsewhere classified (R26.2)    Time: 5053-9767 PT Time Calculation (min) (ACUTE ONLY): 25 min   Charges:   PT Evaluation $PT Eval Low Complexity: 1 Low        Kati PT, DPT Acute Rehabilitation Services Pager: [phone] Office: [phone]  Trena Platt 11/01/2020, 4:25 PM

## 2020-11-01 NOTE — Plan of Care (Signed)
  Problem: Education: Goal: Knowledge of General Education information will improve Description: Including pain rating scale, medication(s)/side effects and non-pharmacologic comfort measures Outcome: Progressing   Problem: Clinical Measurements: Goal: Will remain free from infection Outcome: Progressing   Problem: Clinical Measurements: Goal: Diagnostic test results will improve Outcome: Progressing   

## 2020-11-01 NOTE — Consult Note (Signed)
ORTHOPAEDIC CONSULTATION  REQUESTING PHYSICIAN: Patrecia Pour, MD  PCP:  Dorothyann Peng, NP  Chief Complaint: Left knee pain  HPI: Nicholas Porter is a 86 y.o. male who complains of left knee pain secondary to a fall. The patient has past medical history significant for dementia, hypertension, chronic kidney disease stage IV, chronic thrombocytopenia, coronary artery disease, and carotid stenosis status post left CEA. The patient states that his dog hit him from behind and he fell. He denies loss of consciousness or hitting his head. X-rays in the ED showed no fracture or dislocation. CT scan showed occult intra-articular fracture. He was admitted by the hospitalist service for further care.   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   Cholelithiasis with acute cholecystitis with biliary obstruction 05/2019   Cholecystectomy and biliary stent placed with ERCP   CKD (chronic kidney disease) stage 3, GFR 30-59 ml/min (HCC)    COPD (chronic obstructive pulmonary disease) ([place])    Dementia ([place]) 05/13/2019   Depression    Essential hypertension 06/30/2007     Qualifier: Diagnosis of  By: Cori Razor RN, Mikal Plane     Eye abnormalities    injections in eyes 12/2017   H/O Inferior STEMI (09/2013) 10/05/2013   emergent PCI with Promus DES to RCA (3.0 mm  x 28 mm - 3.2 mm)   History of Anterolateral STEMI 10/01/2019   D1 99% thrombotic lesion-PTCA only.  Had staged PCI of m RCA 95% lesion after prior stent.   Hypothyroidism    Klebsiella sepsis ([place]) 04/02/2017   Left-sided carotid artery disease -- s/p CEA 04/06/2006   S/p Left CEA    Past Surgical History:  Procedure Laterality Date   AMPUTATION     left index finger -tramatic   BILIARY STENT PLACEMENT N/A 01/06/2019   Procedure: BILIARY STENT PLACEMENT;  Surgeon: Clarene Essex, MD;  Location: [facility];  Service: Endoscopy;  Laterality: N/A;   CAROTID ENDARTERECTOMY Left 2007   Dr.Hayes: 08/2016 Dopplers: Stable less than 40% right internal carotid stenosis and stable moderate 40-60% left internal carotid stenosis. Patent vertebral and subclavian arteries.   CARPAL TUNNEL RELEASE     CHOLECYSTECTOMY N/A 01/02/2019   Procedure: LAPAROSCOPIC CHOLECYSTECTOMY;  Surgeon: Michael Boston, MD;  Location: [facility];  Service: General;  Laterality: N/A;   CORNEAL TRANSPLANT     CORONARY ANGIOPLASTY WITH STENT PLACEMENT  09/2013; 05/2014    d) Promus DES 3.0 mm x 28 mm (3.2 mm)  to RCA with STEMI; residual 70% mid-distal LAD, OM 270-80%. Medical management; b) PTCA of 85 % mRCA ISR (  3.25 mm scoring balloon, 3.5 mm Wood balloon), LAD lesion noted as ~40%   CORONARY STENT INTERVENTION N/A 10/08/2019   Procedure: CORONARY STENT INTERVENTION;  Surgeon: Wellington Hampshire, MD;  Location: [facility]; ; 95% mRCA after previous stent--overlapping DES PCI: Resolute Onyx 3.0 mm 18 mm   CORONARY/GRAFT ACUTE MI REVASCULARIZATION N/A 10/01/2019   Procedure: Coronary/Graft Acute MI Revascularization;  Surgeon: Burnell Blanks, MD;  Location: [facility]; ANTEROLATERAL STEMI: PTCA only of 99%  subtotal occluded D1;   ERCP N/A 01/06/2019   Procedure: ENDOSCOPIC RETROGRADE CHOLANGIOPANCREATOGRAPHY (ERCP);  Surgeon: Clarene Essex, MD;  Location: [facility];  Service: Endoscopy;  Laterality: N/A;   ERCP N/A 05/28/2019   Procedure: ENDOSCOPIC RETROGRADE CHOLANGIOPANCREATOGRAPHY (ERCP);  Surgeon: Clarene Essex, MD;  Location: [facility];  Service: Endoscopy;  Laterality: N/A;   FOOT SURGERY     left   HERNIA REPAIR     LEFT HEART CATH AND CORONARY ANGIOGRAPHY N/A 10/01/2019   Procedure: LEFT HEART CATH AND CORONARY ANGIOGRAPHY;  Surgeon: Burnell Blanks, MD;  Location: [facility];     LEFT HEART CATH AND CORONARY ANGIOGRAPHY  11/11/2010   Procedure: LEFT HEART CATH AND CORONARY ANGIOGRAPHY;  Surgeon: Leonie Man, MD;  Location: [facility]; 40% mid LAD, 50% proximal RCA, 30-40% distal RCA (DOMINANT RCA with wraparound PDA & 2 large PLBs), 60-70% ostial OM1. No change from 2001. Medical therapy   LEFT HEART CATHETERIZATION WITH CORONARY ANGIOGRAM N/A 10/05/2013   Procedure: LEFT HEART CATHETERIZATION WITH CORONARY ANGIOGRAM;  Surgeon: Peter M Martinique, MD;  Location: [facility] C[facility] LAB;  Service: Cardiovascular; INFERIOR STEMI: RCA 100% - PCI,CxOM stable, LAD ~70% mid;   LEFT HEART CATHETERIZATION WITH CORONARY ANGIOGRAM N/A 06/08/2014   Procedure: LEFT HEART CATHETERIZATION WITH CORONARY ANGIOGRAM;  Surgeon: Troy Sine, MD;  Location: [facility] LAB; UA 6/'15: 75% ISR in RCA - PTCA, LAD lesion ~40%, CxOM stable.     NM MYOVIEW LTD  03/2017    - Elevatred Troponin in setting of Sepsis -- DEMAND ISCHEMIA.  Normal perfusion. LVEF 51% with normal wall motion. This is a low risk study.   PARTIAL GASTRECTOMY     PERCUTANEOUS CORONARY INTERVENTION-BALLOON ONLY  06/08/2014   Procedure: PERCUTANEOUS CORONARY INTERVENTION-BALLOON ONLY;  Surgeon: Troy Sine, MD;  Location: [facility] CATH LAB;  Service: Cardiovascular;;   REMOVAL OF STONES  05/28/2019   Procedure: REMOVAL OF  STONES;  Surgeon: Clarene Essex, MD;  Location: [facility];  Service: Endoscopy;;   ROTATOR CUFF REPAIR     left   SHOULDER ARTHROSCOPY WITH ROTATOR CUFF REPAIR AND SUBACROMIAL DECOMPRESSION Right 07/01/2013   Procedure: RIGHT SHOULDER ARTHROSCOPY WITH  SUBACROMIAL DECOMPRESSION/DISTAL CLAVICLE RESECTION/ROTATOR CUFF REPAIR ;  Surgeon: Marin Shutter, MD;  Location: [facility];  Service: Orthopedics;  Laterality: Right;   SPHINCTEROTOMY  01/06/2019   Procedure: SPHINCTEROTOMY;  Surgeon: Clarene Essex, MD;  Location: [facility];  Service: Endoscopy;;   [place]   cervical laminectomy   STENT REMOVAL  05/28/2019   Procedure: STENT REMOVAL;  Surgeon: Clarene Essex, MD;  Location: [facility];  Service: Endoscopy;;   TOTAL KNEE ARTHROPLASTY     TRANSTHORACIC ECHOCARDIOGRAM  09/'17; 4/'18; 1/'20   a) Normal EF 55-60%. Mild to moderate AS with mod AI. Mild MR.;; b) Mild global reduction in LV systolic function: 09-38%; grade 1 DD - high LVEDP. Mild AS/AI/TR. Mild-Mod MR; moderately elevated PAP.;; c) TTE 12/2018: EF 55-60%. No RWMA.  Gr1 DD. Mod AS with mild AI. Severe LA dilation.   TRANSTHORACIC ECHOCARDIOGRAM  10/01/2019   ANTEROLATERAL STEMI: EF 45 to 50%.  GR 1 DD.  Normal atrial sizes.  Mild mitral stenosis..  Functionally bicuspid aortic valve with moderate AS (mean gradient 19 mmHg, peak 34 mmHg).   VAGOTOMY     partial gastrectomy   Social History   Socioeconomic History   Marital status: Married    Spouse name: Not on file   Number of children: Not on file   Years of education: Not on file   Highest education level: Not on file  Occupational History    Employer: RETIRED    Comment: Retired  Tobacco Use   Smoking status: Former Smoker    Years: 15.00    Types: Cigarettes    Quit date: 12/23/1964    Years since quitting: 55.8   Smokeless tobacco: Never Used  Vaping Use   Vaping Use: Never used  Substance and Sexual Activity   Alcohol use: No   Drug use: No     Sexual activity: Not on file  Other Topics Concern   Not on file  Social History Narrative   He is married with 2 children. He lives with his wife.   Very active at home, prior to a STEMI, he could walk several miles without discomfort. Prior to his MI, he would use his push mower to mow his entire lawn.   He is a former smoker and quit back in 1966. He does not drink alcohol      CODE STATUS: DO NOT RESUSCITATE   Social Determinants of Health   Financial Resource Strain: Medium Risk   Difficulty of Paying Living Expenses: Somewhat hard  Food Insecurity:    Worried About Charity fundraiser in the Last Year: Not on file   [employer] of Food in the Last Year: Not on file  Transportation Needs:    Lack of Transportation (Medical): Not on file   Lack of Transportation (Non-Medical): Not on file  Physical Activity:    Days of Exercise per Week: Not on file   Minutes of Exercise per Session: Not on file  Stress:    Feeling of Stress : Not on file  Social Connections:    Frequency of Communication with Friends and Family: Not on file   Frequency of Social Gatherings with Friends and Family: Not on file   Attends Religious Services: Not on file   Active Member of Clubs or Organizations: Not on file   Attends Archivist Meetings: Not on file   Marital Status: Not on file   Family History  Problem Relation Age of Onset   Heart disease Sister        Heart Transplant    Diabetes Sister    Allergies  Allergen Reactions   Amlodipine Besylate Hives   Lipitor [Atorvastatin] Itching   Naproxen Diarrhea   Oxycodone-Acetaminophen Itching   Isosorbide Rash   Prior to Admission medications   Medication Sig Start Date End Date Taking? Authorizing Provider  clopidogrel (PLAVIX) 75 MG tablet Take 1 tablet (75 mg total) by mouth daily. Patient taking differently: Take 75 mg by mouth 2 (two) times a week. No specific days 02/23/20  Yes Leonie Man, MD   latanoprost (XALATAN) 0.005 % ophthalmic solution Place 1 drop into both eyes at bedtime.    Yes [provider]  hydrALAZINE (APRESOLINE) 25 MG tablet Take 25 mg tablet if blood pressure  is equal or greater than 170 ( top number) up to 3 three times a day if needed Patient not taking: Reported on 10/31/2020 08/29/20   Leonie Man, MD   CT Knee Left Wo Contrast  Result Date: 10/31/2020 CLINICAL DATA:  Fall, left knee pain EXAM: CT OF THE LEFT KNEE WITHOUT CONTRAST TECHNIQUE: Multidetector CT imaging of the left knee was performed according to the standard protocol. Multiplanar CT image reconstructions were also generated. Metal artifact reduction protocol was utilized. COMPARISON:  X-ray 10/31/2020 FINDINGS: Bones/Joint/Cartilage Status post left total knee arthroplasty. Arthroplasty components are in their expected alignment. No definite periprosthetic fracture is identified. No area of periprosthetic lucency is seen. There is metallic streak artifact, which slightly limits evaluation of the adjacent structures. Small suprapatellar knee joint effusion with layering fat-fluid level (series 6, image 27). There is synovial thickening. Ligaments Suboptimally assessed by CT. Muscles and Tendons No evidence of acute musculotendinous injury by CT. Soft tissues No soft tissue swelling or fluid collection. Advanced atherosclerotic calcifications. IMPRESSION: There is a small suprapatellar knee joint effusion with layering fat-fluid level, compatible with a lipohemarthrosis. Findings are suggestive of an occult intra-articular fracture. No discrete fracture is identified within the limitations of this exam. Electronically Signed   By: Davina Poke D.O.   On: 10/31/2020 16:42   DG Knee Complete 4 Views Left  Result Date: 10/31/2020 CLINICAL DATA:  Fall. EXAM: LEFT KNEE - COMPLETE 4+ VIEW COMPARISON:  None. FINDINGS: No evidence of acute fracture or dislocation. Small joint effusion. Total knee  arthroplasty. No evidence of hardware complication. Calcific atherosclerosis. IMPRESSION: 1. No evidence of acute fracture or dislocation. 2. Small joint effusion. Electronically Signed   By: Margaretha Sheffield MD   On: 10/31/2020 11:43    Positive ROS: All other systems have been reviewed and were otherwise negative with the exception of those mentioned in the HPI and as above.  Physical Exam: General: Alert, no acute distress Cardiovascular: No pedal edema Respiratory: No cyanosis, no use of accessory musculature GI: No organomegaly, abdomen is soft and non-tender Skin: No lesions in the area of chief complaint Neurologic: Sensation intact distally Psychiatric: Patient is competent for consent with normal mood and affect Lymphatic: No axillary or cervical lymphadenopathy  MUSCULOSKELETAL: Left knee is slightly tender with reduced range of motion.   Assessment: Lipohemarthrosis of the left knee suggestive for occult intra-articular fracture  Plan: Left knee fracture: Knee immobilizer, TDWB w/ walker. Follow up with Dr. Lyla Glassing in two weeks    Dorothyann Peng, [place] [phone]    11/01/2020 9:01 AM

## 2020-11-01 NOTE — Progress Notes (Signed)
Pt refused to take any of his medications tonight, stated he has not been seen by any doctor here and his kidney doctor told him not to take any medications without talking to him first. Tried to explain to pt that he has been seen by a hospitalist, but pt refused to take anything. Will continue to monitor.

## 2020-11-02 ENCOUNTER — Telehealth: Payer: Self-pay | Admitting: Cardiology

## 2020-11-02 DIAGNOSIS — Z9119 Patient's noncompliance with other medical treatment and regimen: Secondary | ICD-10-CM | POA: Diagnosis not present

## 2020-11-02 DIAGNOSIS — J449 Chronic obstructive pulmonary disease, unspecified: Secondary | ICD-10-CM | POA: Diagnosis not present

## 2020-11-02 DIAGNOSIS — E43 Unspecified severe protein-calorie malnutrition: Secondary | ICD-10-CM | POA: Diagnosis not present

## 2020-11-02 DIAGNOSIS — Z23 Encounter for immunization: Secondary | ICD-10-CM | POA: Diagnosis not present

## 2020-11-02 DIAGNOSIS — M6281 Muscle weakness (generalized): Secondary | ICD-10-CM | POA: Diagnosis not present

## 2020-11-02 DIAGNOSIS — I10 Essential (primary) hypertension: Secondary | ICD-10-CM | POA: Diagnosis not present

## 2020-11-02 DIAGNOSIS — M25462 Effusion, left knee: Secondary | ICD-10-CM | POA: Diagnosis not present

## 2020-11-02 DIAGNOSIS — Y92019 Unspecified place in single-family (private) house as the place of occurrence of the external cause: Secondary | ICD-10-CM | POA: Diagnosis not present

## 2020-11-02 DIAGNOSIS — R451 Restlessness and agitation: Secondary | ICD-10-CM | POA: Diagnosis not present

## 2020-11-02 DIAGNOSIS — R634 Abnormal weight loss: Secondary | ICD-10-CM | POA: Diagnosis not present

## 2020-11-02 DIAGNOSIS — S82002D Unspecified fracture of left patella, subsequent encounter for closed fracture with routine healing: Secondary | ICD-10-CM | POA: Diagnosis not present

## 2020-11-02 DIAGNOSIS — M25562 Pain in left knee: Secondary | ICD-10-CM | POA: Diagnosis not present

## 2020-11-02 DIAGNOSIS — W1831XA Fall on same level due to stepping on an object, initial encounter: Secondary | ICD-10-CM | POA: Diagnosis not present

## 2020-11-02 DIAGNOSIS — H409 Unspecified glaucoma: Secondary | ICD-10-CM | POA: Diagnosis not present

## 2020-11-02 DIAGNOSIS — H04123 Dry eye syndrome of bilateral lacrimal glands: Secondary | ICD-10-CM | POA: Diagnosis not present

## 2020-11-02 DIAGNOSIS — E46 Unspecified protein-calorie malnutrition: Secondary | ICD-10-CM | POA: Diagnosis not present

## 2020-11-02 DIAGNOSIS — N184 Chronic kidney disease, stage 4 (severe): Secondary | ICD-10-CM | POA: Diagnosis not present

## 2020-11-02 DIAGNOSIS — D631 Anemia in chronic kidney disease: Secondary | ICD-10-CM | POA: Diagnosis not present

## 2020-11-02 DIAGNOSIS — Z9861 Coronary angioplasty status: Secondary | ICD-10-CM | POA: Diagnosis not present

## 2020-11-02 DIAGNOSIS — I2510 Atherosclerotic heart disease of native coronary artery without angina pectoris: Secondary | ICD-10-CM | POA: Diagnosis not present

## 2020-11-02 DIAGNOSIS — F0391 Unspecified dementia with behavioral disturbance: Secondary | ICD-10-CM | POA: Diagnosis not present

## 2020-11-02 DIAGNOSIS — D696 Thrombocytopenia, unspecified: Secondary | ICD-10-CM | POA: Diagnosis not present

## 2020-11-02 DIAGNOSIS — F0390 Unspecified dementia without behavioral disturbance: Secondary | ICD-10-CM | POA: Diagnosis not present

## 2020-11-02 DIAGNOSIS — K5909 Other constipation: Secondary | ICD-10-CM | POA: Diagnosis not present

## 2020-11-02 DIAGNOSIS — F411 Generalized anxiety disorder: Secondary | ICD-10-CM | POA: Diagnosis not present

## 2020-11-02 DIAGNOSIS — R41841 Cognitive communication deficit: Secondary | ICD-10-CM | POA: Diagnosis not present

## 2020-11-02 LAB — SARS CORONAVIRUS 2 BY RT PCR (HOSPITAL ORDER, PERFORMED IN ~~LOC~~ HOSPITAL LAB): SARS Coronavirus 2: NEGATIVE

## 2020-11-02 NOTE — Telephone Encounter (Signed)
Returned call to daughter (EC)-who states patient is being discharged today from hospital after a fall.    She remembered Dr Ellyn Hack telling them if BP >170 to take prn med and the nurses did not see that in his chart.  Advised per last OV Dr. Ellyn Hack recommended taking hydralazine 25 mg TID AS needed for BP >694 systolic.     She also reports concerns that patient needs to go to rehab facility due to wife not being able to care for him at home.   Encouraged daughter to call and speak with the nurse caring for patient and voice her concerns (she is unable to go see him d/t limit of 1 visitor).    She will call and speak with nurse.

## 2020-11-02 NOTE — Plan of Care (Signed)
Goals of care adequately met for d/c.

## 2020-11-02 NOTE — Progress Notes (Signed)
Transported to rehab via [facility] at this time. RN report had been given.

## 2020-11-02 NOTE — Discharge Summary (Signed)
Physician Discharge Summary  Nicholas Porter VEL:[id number] DOB: 04-25-1930 DOA: 10/31/2020  PCP: Dorothyann Peng, NP  Admit date: 10/31/2020 Discharge date: 11/02/2020  Admitted From: Home Disposition: SNF   Recommendations for Outpatient Follow-up:  1. Follow up with PCP in 1-2 weeks 2. Please obtain BMP in the next week. Pt declined repeat metabolic panel this AM.  Home Health: N/A Equipment/Devices: Left knee immobilizer Discharge Condition: Stable CODE STATUS: DNR Diet recommendation: Heart healthy  Brief/Interim Summary: Nicholas Porter is an 86 y.o. male with a history of dementia, HTN, stage IV CKD, chronic thrombocytopenia, CAD, carotid stenosis s/p left CEA who presented from home after a fall onto the left knee at home after tripping over/being pushed by his dog. He had an inability to bear weight thereafter. Left knee XR showed no fracture, though subsequent CT revealed an occult intra-articular fracture. He was evaluated by orthopedics who recommended a knee immobilizer, TDWB, and outpatient follow up in 2 weeks, but no operative management planned. SNF has been recommended by PT and OT which is being pursued.   Discharge Diagnoses:  Principal Problem:   Knee pain Active Problems:   Essential hypertension   Thrombocytopenia (HCC)   Anemia of chronic renal failure, stage 4 (severe) (HCC)   CKD (chronic kidney disease) stage 4, GFR 15-29 ml/min (HCC)   CAD (coronary artery disease)  Left intraarticular knee fracture after fall at home: Lipohemarthrosis of the left knee suggestive for occult intra-articular fracture.  - D/w orthopedics PA, recommendation is TDWB w/walker in knee immobilizer, PT/OT, and 2 week follow up with himself or Dr. Lyla Glassing.  - Pursue SNF for continued PT/OT.   HTN: With widened pulse pressure suggestive of systemic atherosclerotic burden.  - Pt reluctant to take scheduled antihypertensive. Would continue to encourage standing hydralazine as he has  tolerated this, but he consents to taking prn hydralazine as ordered at home.  CAD:  - Continue plavix. Has statin allergy and declines initiation of beta blocker. Also w/norvasc and isosorbide allergies listed.  Carotid stenosis s/p CEA:  - Continue plavix  Dementia: Oriented completely this morning. Doesn't appear to be on medications. Did have mild delirium overnight while hospitalized but back to baseline. - Delirium precautions  Chronic thrombocytopenia:  - Monitor intermittently as it is stable.  Stage IV CKD: Near baseline - Avoid nephrotoxins, follow up with [facility] Dr. Justin Mend per routine.  Hyperkalemia: Pt reports significant banana intake prior to this lab. Did take lokelma and has had no dysrhythmias on telemetry and renal function is at baseline. Refuses recheck BMP this AM.  - Recheck at SNF, avoid high K foods.   Underweight: Estimated body mass index is 14.1 kg/m as calculated from the following:   Height as of this encounter: 5\' 7"  (1.702 m).   Weight as of this encounter: 40.8 kg.  Discharge Instructions  Allergies as of 11/02/2020      Reactions   Amlodipine Besylate Hives   Lipitor [atorvastatin] Itching   Naproxen Diarrhea   Oxycodone-acetaminophen Itching   Isosorbide Rash      Medication List    TAKE these medications   clopidogrel 75 MG tablet Commonly known as: PLAVIX Take 1 tablet (75 mg total) by mouth daily. What changed:   when to take this  additional instructions   hydrALAZINE 25 MG tablet Commonly known as: APRESOLINE Take 25 mg tablet if blood pressure is equal or greater than 170 ( top number) up to 3 three times a day if needed   latanoprost  0.005 % ophthalmic solution Commonly known as: XALATAN Place 1 drop into both eyes at bedtime.       Follow-up Information    Nafziger, [name], NP Follow up.   Specialty: Family Medicine Contact information: [place] [address] [phone]         Leonie Man, MD .   Specialty: Cardiology Contact information: [address] [place] [address] [place] [address] [phone]              Allergies  Allergen Reactions  . Amlodipine Besylate Hives  . Lipitor [Atorvastatin] Itching  . Naproxen Diarrhea  . Oxycodone-Acetaminophen Itching  . Isosorbide Rash    Consultations:  None  Procedures/Studies: CT Knee Left Wo Contrast  Result Date: 10/31/2020 CLINICAL DATA:  Fall, left knee pain EXAM: CT OF THE LEFT KNEE WITHOUT CONTRAST TECHNIQUE: Multidetector CT imaging of the left knee was performed according to the standard protocol. Multiplanar CT image reconstructions were also generated. Metal artifact reduction protocol was utilized. COMPARISON:  X-ray 10/31/2020 FINDINGS: Bones/Joint/Cartilage Status post left total knee arthroplasty. Arthroplasty components are in their expected alignment. No definite periprosthetic fracture is identified. No area of periprosthetic lucency is seen. There is metallic streak artifact, which slightly limits evaluation of the adjacent structures. Small suprapatellar knee joint effusion with layering fat-fluid level (series 6, image 27). There is synovial thickening. Ligaments Suboptimally assessed by CT. Muscles and Tendons No evidence of acute musculotendinous injury by CT. Soft tissues No soft tissue swelling or fluid collection. Advanced atherosclerotic calcifications. IMPRESSION: There is a small suprapatellar knee joint effusion with layering fat-fluid level, compatible with a lipohemarthrosis. Findings are suggestive of an occult intra-articular fracture. No discrete fracture is identified within the limitations of this exam. Electronically Signed   By: Davina Poke D.O.   On: 10/31/2020 16:42   DG Knee Complete 4 Views Left  Result Date: 10/31/2020 CLINICAL DATA:  Fall. EXAM: LEFT KNEE - COMPLETE 4+ VIEW COMPARISON:  None. FINDINGS: No evidence of acute fracture or dislocation. Small  joint effusion. Total knee arthroplasty. No evidence of hardware complication. Calcific atherosclerosis. IMPRESSION: 1. No evidence of acute fracture or dislocation. 2. Small joint effusion. Electronically Signed   By: Margaretha Sheffield MD   On: 10/31/2020 11:43   Subjective: No pain or other complaints this morning. Did have some confusion last night thinking he heard his furnace and thought he was at home. Took off immobilizer but back to being oriented this AM.  Discharge Exam: Vitals:   11/01/20 1735 11/01/20 1931  BP: (!) 149/64 (!) 186/72  Pulse: 76 71  Resp: 14 18  Temp: 98.8 F (37.1 C) 98.2 F (36.8 C)  SpO2: 100% 100%   General: Pt is alert, awake, not in acute distress Cardiovascular: RRR, S1/S2 +, no rubs, no gallops Respiratory: CTA bilaterally, no wheezing, no rhonchi Abdominal: Soft, scaphoid, NT, ND, bowel sounds + Extremities: No edema, no cyanosis. Decreased muscle bulk, no significant left knee tenderness or effusion. No palpable deformity.   Labs: Basic Metabolic Panel: Recent Labs  Lab 10/31/20 1105 11/01/20 0631  NA 138 142  K 4.6 5.4*  CL 113* 114*  CO2 18* 19*  GLUCOSE 130* 86  BUN 53* 50*  CREATININE 2.13* 1.90*  CALCIUM 9.0 9.1   Liver Function Tests: Recent Labs  Lab 11/01/20 0631  AST 15  ALT 15  ALKPHOS 81  BILITOT 0.6  PROT 6.3*  ALBUMIN 3.3*   CBC: Recent Labs  Lab 10/31/20 1105  11/01/20 0631  WBC 4.0 4.0  NEUTROABS 2.5  --   HGB 12.1* 11.4*  HCT 36.7* 33.5*  MCV 99.2 97.7  PLT 66* 66*   Urinalysis    Component Value Date/Time   COLORURINE YELLOW 10/31/2020 1657   APPEARANCEUR CLOUDY (A) 10/31/2020 1657   LABSPEC 1.013 10/31/2020 1657   PHURINE 5.0 10/31/2020 1657   GLUCOSEU NEGATIVE 10/31/2020 1657   GLUCOSEU NEGATIVE 04/15/2017 1718   HGBUR SMALL (A) 10/31/2020 1657   HGBUR negative 12/06/2009 0836   BILIRUBINUR NEGATIVE 10/31/2020 1657   BILIRUBINUR neg 03/02/2019 1541   KETONESUR NEGATIVE 10/31/2020 1657    PROTEINUR NEGATIVE 10/31/2020 1657   UROBILINOGEN 0.2 03/02/2019 1541   UROBILINOGEN 0.2 04/15/2017 1718   NITRITE NEGATIVE 10/31/2020 1657   LEUKOCYTESUR LARGE (A) 10/31/2020 1657    Time coordinating discharge: Approximately 40 minutes  Patrecia Pour, MD  [facility] 11/02/2020, 9:16 AM

## 2020-11-02 NOTE — Telephone Encounter (Signed)
New Message:    Daughter called. Pt is in the hospital and  She wants to talk to you about what the nurse have requested of her Mother to do, concerning pt's medicine he is on for his high  blood pressure.

## 2020-11-02 NOTE — Progress Notes (Signed)
Patient report called to  [employer] at [employer].

## 2020-11-02 NOTE — Plan of Care (Signed)

## 2020-11-02 NOTE — Care Management Important Message (Signed)
Important Message  Patient Details IM Letter given to the Patient Name: Nicholas Porter MRN: [id number] Date of Birth: 1930-10-30   Medicare Important Message Given:        Kerin Salen 11/02/2020, 10:50 AM

## 2020-11-02 NOTE — NC FL2 (Addendum)
[employer] LEVEL OF CARE SCREENING TOOL     IDENTIFICATION  Patient Name: Nicholas Porter Birthdate: 10/31/1930 Sex: male Admission Date (Current Location): 10/31/2020  [facility] and [place] Number:  Herbalist and Address:  [facility],  [address] [place], [place]      Provider Number: [id number]  Attending Physician Name and Address:  Patrecia Pour, MD  Relative Name and Phone Number:  Pavlos, Yon   [phone]    Current Level of Care: Hospital Recommended Level of Care: [place] Prior Approval Number:    Date Approved/Denied:   PASRR Number: [id number]  Discharge Plan: SNF    Current Diagnoses: Patient Active Problem List   Diagnosis Date Noted   Knee pain 10/31/2020   CAD (coronary artery disease) 10/31/2020   Goals of care, counseling/discussion    Palliative care by specialist    ST elevation myocardial infarction (STEMI) of anterolateral wall ([place]) 10/01/2019   Failure to thrive in adult 07/20/2019   Dementia ([place]) 05/13/2019   Moderate aortic stenosis by prior echocardiogram 01/28/2019   Epistaxis, recurrent 01/28/2019   Delayed cystic duct stump bile leak s/p ERCP/stent 01/06/2019 01/07/2019   Protein-calorie malnutrition, severe 01/04/2019   CKD (chronic kidney disease), stage III ([place]) 01/03/2019   Pill dysphagia 01/02/2019   Acute gangrenous cholecystitis s/p lap cholecystectomy 01/02/2019 01/01/2019   CKD (chronic kidney disease) stage 4, GFR 15-29 ml/min ([place]) 01/01/2019   Unintentional weight loss 01/01/2019   History of medication noncompliance 01/01/2019   History of partial gastrectomy 01/01/2019   Hyperlipidemia with target LDL less than 70 10/23/2018   Psychiatric disturbance 04/15/2017   Orthostatic hypotension 04/08/2017   Acute kidney injury superimposed on chronic kidney disease ([place]) 04/02/2017   Benign prostatic hyperplasia 03/11/2017   Weakness     Anemia of chronic renal failure, stage 4 (severe) ([place]) 11/27/2015   GERD (gastroesophageal reflux disease) 01/24/2015   Thrombocytopenia ([place]) 08/09/2014   H/O Inferior STEMI (09/2013) emergent PCI with Promus DES to RCA (3.0 mm  x 28 mm - 3.2 mm) 10/05/2013   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   CAD S/P PC: DES in RCA with PTCA for ISR & recent staged overlapping DES stent for m-dRCA tenosis & PTCA of Culprit 99% D1 lesion; Moderate mLAD, 80% ostial OM2 stable 05/22/2008   Essential hypertension 06/30/2007   Left-sided carotid artery disease -- s/p CEA 04/06/2006    Orientation RESPIRATION BLADDER Height & Weight     Self, Place  Normal Continent Weight: 90 lb (40.8 kg) Height:  5\' 7"  (170.2 cm)  BEHAVIORAL SYMPTOMS/MOOD NEUROLOGICAL BOWEL NUTRITION STATUS      Continent Diet (Regular Diet)  AMBULATORY STATUS COMMUNICATION OF NEEDS Skin   Extensive Assist Verbally Bruising                       Personal Care Assistance Level of Assistance  Bathing, Feeding, Dressing Bathing Assistance: Maximum assistance Feeding assistance: Limited assistance Dressing Assistance: Maximum assistance     Functional Limitations Info  Sight, Hearing, Speech Sight Info: Impaired Hearing Info: Adequate Speech Info: Adequate    SPECIAL CARE FACTORS FREQUENCY  PT (By licensed PT), OT (By licensed OT)     PT Frequency: 5x/week OT Frequency: 5x/week            Contractures Contractures Info: Not present    Additional Factors Info  Code Status, Allergies, Psychotropic Code Status Info: DNR Allergies Info: Allergies: Amlodipine Besylate, Lipitor  Atorvastatin, Naproxen, Oxycodone-acetaminophen, Isosorbide           Current Medications (11/02/2020):  This is the current hospital active medication list Current Facility-Administered Medications  Medication Dose Route Frequency Provider Last Rate Last Admin   acetaminophen (TYLENOL) tablet 650 mg  650 mg Oral Q6H PRN Aline August, MD       Or   acetaminophen (TYLENOL) suppository 650 mg  650 mg Rectal Q6H PRN Starla Link, Kshitiz, MD       hydrALAZINE (APRESOLINE) injection 5 mg  5 mg Intravenous Q4H PRN Aline August, MD   5 mg at 11/01/20 0950   hydrALAZINE (APRESOLINE) tablet 10 mg  10 mg Oral Q8H Patrecia Pour, MD   10 mg at 11/02/20 0928   latanoprost (XALATAN) 0.005 % ophthalmic solution 1 drop  1 drop Both Eyes QHS Alekh, Kshitiz, MD   1 drop at 10/31/20 2130   morphine 2 MG/ML injection 1 mg  1 mg Intravenous Q2H PRN Aline August, MD       ondansetron (ZOFRAN) tablet 4 mg  4 mg Oral Q6H PRN Aline August, MD       Or   ondansetron (ZOFRAN) injection 4 mg  4 mg Intravenous Q6H PRN Alekh, Kshitiz, MD       oxyCODONE (Oxy IR/ROXICODONE) immediate release tablet 5 mg  5 mg Oral Q4H PRN Alekh, Kshitiz, MD       senna-docusate (Senokot-S) tablet 1 tablet  1 tablet Oral QHS PRN Starla Link, Kshitiz, MD       sodium zirconium cyclosilicate (LOKELMA) packet 10 g  10 g Oral BID Patrecia Pour, MD   10 g at 11/02/20 0932     Discharge Medications: Please see discharge summary for a list of discharge medications.  Relevant Imaging Results:  Relevant Lab Results:   Additional Information SS# [id number]  Lia Hopping, LCSW

## 2020-11-02 NOTE — TOC Transition Note (Signed)
Transition of Care [facility]) - CM/SW Discharge Note   Patient Details  Name: Nicholas Porter MRN: [id number] Date of Birth: [birth year]  Transition of Care [facility]) CM/SW Contact:  Lia Hopping, [place] Phone Number: 11/02/2020, 11:58 AM   Clinical Narrative:    Patient first choice [facility] declined to accept. CSW provided a list of bed offers. Patient/Spouse and daughter chose [facility].  CSW confirm availability.  PTAR to transport.   Pending negative covid test.    Final next level of care: Skilled Nursing Facility Barriers to Discharge:  (covid test)   Patient Goals and CMS Choice Patient states their goals for this hospitalization and ongoing recovery are:: go to SNF for rehab CMS Medicare.gov Compare Post Acute Care list provided to:: Other (Comment Required) (Spouse-[name],[name]) Choice offered to / list presented to : Spouse  Discharge Placement   Existing PASRR number confirmed : 11/02/20          Patient chooses bed at: [facility] Patient to be transferred to facility by: [place] Name of family member notified: Spouse at bedside Patient and family notified of of transfer: 11/02/20  Discharge Plan and Services In-house Referral: Clinical Social Work Discharge Planning Services: CM Consult                                 Social Determinants of Health ([place]) Interventions     Readmission Risk Interventions Readmission Risk Prevention Plan 10/08/2019  Transportation Screening Complete  HRI or Home Care Consult Complete  Social Work Consult for [place] Planning/Counseling Complete  Palliative Care Screening Complete  Medication Review Press photographer) Complete  Some recent data might be hidden

## 2020-11-02 NOTE — TOC Initial Note (Addendum)
Transition of Care [facility]) - Initial/Assessment Note    Patient Details  Name: Nicholas Porter MRN: [id number] Date of Birth: 1930/03/22  [facility]) CM/SW Contact:    Lia Hopping, [place] Phone Number: 11/02/2020, 10:24 AM  Clinical Narrative:    Patient admitted after a fall at home. Patient has history of dementia, hypertension, chronic kidney disease stage iv, chronic thrombocytopenia coronary artery disease, carotid stenosis status post left CEA.              CSW met with the patient spouse at bedside to discuss discharge plan to SNF for rehab.Spouse reports the patient went to SNF rehab about a year ago and then transitioned back home with therapy services. Spouse reports she assist the patient with his ADL's and he uses a RW.  Patient is currently active with [facility] for RN and social work services.  CSW explain SNF process (3 midnight stay/ Medicare Waiver).  FL2 complete.   Expected Discharge Plan: Skilled Nursing Facility Barriers to Discharge: Barriers Resolved   Patient Goals and CMS Choice Patient states their goals for this hospitalization and ongoing recovery are:: go to SNF for rehab CMS Medicare.gov Compare Post Acute Care list provided to:: Other (Comment Required) (Spouse-[name],[name]) Choice offered to / list presented to : Spouse  Expected Discharge Plan and Services Expected Discharge Plan: [place] In-house Referral: Clinical Social Work Discharge Planning Services: CM Consult   Living arrangements for the past 2 months: [place] Expected Discharge Date: 11/02/20                                   Prior Living Arrangements/Services Living arrangements for the past 2 months: Single Family Home Lives with:: Spouse Patient language and need for interpreter reviewed:: No Do you feel safe going back to the place where you live?: Yes      Need for Family Participation in Patient Care: Yes (Comment) Care  giver support system in place?: Yes (comment) Current home services: DME, Home RN, Other (comment) (Education officer, museum) Criminal Activity/Legal Involvement Pertinent to Current Situation/Hospitalization: No - Comment as needed  Activities of Daily Living Home Assistive Devices/Equipment: Environmental consultant (specify type) (watch for the blind) ADL Screening (condition at time of admission) Patient's cognitive ability adequate to safely complete daily activities?: Yes Is the patient deaf or have difficulty hearing?: No Does the patient have difficulty seeing, even when wearing glasses/contacts?: Yes Does the patient have difficulty concentrating, remembering, or making decisions?: No Patient able to express need for assistance with ADLs?: Yes Does the patient have difficulty dressing or bathing?: Yes Independently performs ADLs?: No Communication: Independent Dressing (OT): Needs assistance Is this a change from baseline?: Pre-admission baseline Grooming: Independent Feeding: Independent Bathing: Needs assistance Is this a change from baseline?: Pre-admission baseline Toileting: Needs assistance Is this a change from baseline?: Pre-admission baseline In/Out Bed: Needs assistance Is this a change from baseline?: Pre-admission baseline Walks in Home: Dependent Is this a change from baseline?: Pre-admission baseline Does the patient have difficulty walking or climbing stairs?: Yes Weakness of Legs: Left Weakness of Arms/Hands: None  Permission Sought/Granted Permission sought to share information with : Facility Art therapist granted to share information with : Yes, Verbal Permission Granted  Share Information with NAME: Strausbaugh,[name]  Permission granted to share info w AGENCY: SNF's in the area  Permission granted to share info w Relationship: Spouse  Permission granted to  share info w Contact Information: [phone]  Emotional Assessment Appearance:: Appears stated  age Attitude/Demeanor/Rapport: Gracious Affect (typically observed): Calm Orientation: : Oriented to Self, Oriented to Place Alcohol / Substance Use: Not Applicable Psych Involvement: No (comment)  Admission diagnosis:  Knee pain [M25.569] Fall, initial encounter [W19.XXXA] Periprosthetic fracture around internal prosthetic knee joint [id number], [place] Patient Active Problem List   Diagnosis Date Noted  . Knee pain 10/31/2020  . CAD (coronary artery disease) 10/31/2020  . Goals of care, counseling/discussion   . Palliative care by specialist   . ST elevation myocardial infarction (STEMI) of anterolateral wall ([place]) 10/01/2019  . Failure to thrive in adult 07/20/2019  . Dementia ([place]) 05/13/2019  . Moderate aortic stenosis by prior echocardiogram 01/28/2019  . Epistaxis, recurrent 01/28/2019  . Delayed cystic duct stump bile leak s/p ERCP/stent 01/06/2019 01/07/2019  . Protein-calorie malnutrition, severe 01/04/2019  . CKD (chronic kidney disease), stage III ([place]) 01/03/2019  . Pill dysphagia 01/02/2019  . Acute gangrenous cholecystitis s/p lap cholecystectomy 01/02/2019 01/01/2019  . CKD (chronic kidney disease) stage 4, GFR 15-29 ml/min (HCC) 01/01/2019  . Unintentional weight loss 01/01/2019  . History of medication noncompliance 01/01/2019  . History of partial gastrectomy 01/01/2019  . Hyperlipidemia with target LDL less than 70 10/23/2018  . Psychiatric disturbance 04/15/2017  . Orthostatic hypotension 04/08/2017  . Acute kidney injury superimposed on chronic kidney disease ([place]) 04/02/2017  . Benign prostatic hyperplasia 03/11/2017  . Weakness   . Anemia of chronic renal failure, stage 4 (severe) ([place]) 11/27/2015  . GERD (gastroesophageal reflux disease) 01/24/2015  . Thrombocytopenia ([place]) 08/09/2014  . H/O Inferior STEMI (09/2013) emergent PCI with Promus DES to RCA (3.0 mm  x 28 mm - 3.2 mm) 10/05/2013  . 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 CAD S/P PC: DES in RCA with  PTCA for ISR & recent staged overlapping DES stent for m-dRCA tenosis & PTCA of Culprit 99% D1 lesion; Moderate mLAD, 80% ostial OM2 stable 05/22/2008  . Essential hypertension 06/30/2007  . Left-sided carotid artery disease -- s/p CEA 04/06/2006    Class: History of   PCP:  Dorothyann Peng, NP Pharmacy:   [facility] [place], [address] AT [address] [place] [address] Phone: [phone] Fax: [phone]  [facility] [place], [place] [address] [place] [address] Phone: [phone] Fax: [phone]     Social Determinants of Health (SDOH) Interventions    Readmission Risk Interventions Readmission Risk Prevention Plan 10/08/2019  Transportation Screening Complete  HRI or Home Care Consult Complete  Social Work Consult for [place] Planning/Counseling Complete  Palliative Care Screening Complete  Medication Review Press photographer) Complete  Some recent data might be hidden

## 2020-11-02 NOTE — Progress Notes (Signed)
Pt refusing labs x2, Vitals and all medications at this time. Pt is somewhat confused and at times combative with staff. Pt currently resting in bed with call light and urinal within reach. Bed alarm on and fall mats in place. Will continue to monitor.

## 2020-11-06 DIAGNOSIS — H04123 Dry eye syndrome of bilateral lacrimal glands: Secondary | ICD-10-CM | POA: Diagnosis not present

## 2020-11-06 DIAGNOSIS — H409 Unspecified glaucoma: Secondary | ICD-10-CM | POA: Diagnosis not present

## 2020-11-06 DIAGNOSIS — F0391 Unspecified dementia with behavioral disturbance: Secondary | ICD-10-CM | POA: Diagnosis not present

## 2020-11-06 DIAGNOSIS — R634 Abnormal weight loss: Secondary | ICD-10-CM | POA: Diagnosis not present

## 2020-11-06 DIAGNOSIS — E43 Unspecified severe protein-calorie malnutrition: Secondary | ICD-10-CM | POA: Diagnosis not present

## 2020-11-08 DIAGNOSIS — F0391 Unspecified dementia with behavioral disturbance: Secondary | ICD-10-CM | POA: Diagnosis not present

## 2020-11-08 DIAGNOSIS — R451 Restlessness and agitation: Secondary | ICD-10-CM | POA: Diagnosis not present

## 2020-11-08 DIAGNOSIS — K5909 Other constipation: Secondary | ICD-10-CM | POA: Diagnosis not present

## 2020-11-08 DIAGNOSIS — F411 Generalized anxiety disorder: Secondary | ICD-10-CM | POA: Diagnosis not present

## 2020-11-09 DIAGNOSIS — N184 Chronic kidney disease, stage 4 (severe): Secondary | ICD-10-CM | POA: Diagnosis not present

## 2020-11-09 DIAGNOSIS — S82002D Unspecified fracture of left patella, subsequent encounter for closed fracture with routine healing: Secondary | ICD-10-CM | POA: Diagnosis not present

## 2020-11-09 DIAGNOSIS — I10 Essential (primary) hypertension: Secondary | ICD-10-CM | POA: Diagnosis not present

## 2020-11-09 DIAGNOSIS — I2510 Atherosclerotic heart disease of native coronary artery without angina pectoris: Secondary | ICD-10-CM | POA: Diagnosis not present

## 2020-11-14 DIAGNOSIS — M25562 Pain in left knee: Secondary | ICD-10-CM | POA: Diagnosis not present

## 2020-11-21 ENCOUNTER — Telehealth: Payer: Self-pay | Admitting: Cardiology

## 2020-11-21 NOTE — Telephone Encounter (Signed)
Called back and spoke with wife. Reassured her of below information. She also expressed concern about his kidney function which is followed closely by Dr. Justin Mend and the rehab facility giving patient's his eye drops at inappropriate times. Recommended reaching out to Dr. Justin Mend and rehab facility over those concerns. Wife voiced understanding and agreed and thanked me for calling.  Darreld Mclean, PA-C 11/21/2020 10:35 AM

## 2020-11-21 NOTE — Telephone Encounter (Signed)
Patient's wife is returning call. 

## 2020-11-21 NOTE — Telephone Encounter (Signed)
Opal Sidles is calling to inform Dr. Ellyn Hack that Nicholas Porter is currently in rehab. She is requesting Dr. Ellyn Hack reach out to the rehab facility due to her having trouble getting a callback from them and being concerned with his care. She states she does not think they are checking his BP everyday based on her last conversation with them and he has also lost weight and is now down to 85 lbs. She states she would like to be sure he is getting his correct medication everyday due to them advising her his last to BP reading were 164/62 on 11/27 and 165/71 on 1/25. She states the number to the rehab facility is [phone]. Please advise.

## 2020-11-21 NOTE — Telephone Encounter (Signed)
I am helping in Triage today. Called and spoke with rehab facility. They are checking his BP daily and giving Hydralazine 25mg  three times daily as needed if systolic BP >119. They said his systolic BP has been running in the 140's to 160's so he has not needed any of the PRN Hydralazine. Attempted to call and update wife but went to voicemail. Left voicemail reassuring her that I spoke with rehab facility and he is getting his appropriate medications.  Darreld Mclean, PA-C 11/21/2020 9:58 AM

## 2020-11-23 DIAGNOSIS — F411 Generalized anxiety disorder: Secondary | ICD-10-CM | POA: Diagnosis not present

## 2020-11-23 DIAGNOSIS — R451 Restlessness and agitation: Secondary | ICD-10-CM | POA: Diagnosis not present

## 2020-11-23 DIAGNOSIS — Z9119 Patient's noncompliance with other medical treatment and regimen: Secondary | ICD-10-CM | POA: Diagnosis not present

## 2020-11-23 DIAGNOSIS — F0391 Unspecified dementia with behavioral disturbance: Secondary | ICD-10-CM | POA: Diagnosis not present

## 2020-12-12 DIAGNOSIS — I2510 Atherosclerotic heart disease of native coronary artery without angina pectoris: Secondary | ICD-10-CM | POA: Diagnosis not present

## 2020-12-12 DIAGNOSIS — F0391 Unspecified dementia with behavioral disturbance: Secondary | ICD-10-CM | POA: Diagnosis not present

## 2020-12-12 DIAGNOSIS — E46 Unspecified protein-calorie malnutrition: Secondary | ICD-10-CM | POA: Diagnosis not present

## 2020-12-12 DIAGNOSIS — J449 Chronic obstructive pulmonary disease, unspecified: Secondary | ICD-10-CM | POA: Diagnosis not present

## 2020-12-12 DIAGNOSIS — N184 Chronic kidney disease, stage 4 (severe): Secondary | ICD-10-CM | POA: Diagnosis not present

## 2020-12-12 DIAGNOSIS — F411 Generalized anxiety disorder: Secondary | ICD-10-CM | POA: Diagnosis not present

## 2020-12-12 DIAGNOSIS — S82002D Unspecified fracture of left patella, subsequent encounter for closed fracture with routine healing: Secondary | ICD-10-CM | POA: Diagnosis not present

## 2020-12-12 DIAGNOSIS — M6281 Muscle weakness (generalized): Secondary | ICD-10-CM | POA: Diagnosis not present

## 2020-12-18 ENCOUNTER — Telehealth: Payer: Self-pay | Admitting: Pharmacist

## 2020-12-18 NOTE — Chronic Care Management (AMB) (Signed)
    Chronic Care Management Pharmacy Assistant   Name: Nicholas Porter  MRN: [id number] DOB: 10-14-1930  Reason for Encounter: Disease State  PCP : Dorothyann Peng, NP  Allergies:   Allergies  Allergen Reactions  . Amlodipine Besylate Hives  . Lipitor [Atorvastatin] Itching  . Naproxen Diarrhea  . Oxycodone-Acetaminophen Itching  . Isosorbide Rash    Medications: Outpatient Encounter Medications as of 12/18/2020  Medication Sig Note  . clopidogrel (PLAVIX) 75 MG tablet Take 1 tablet (75 mg total) by mouth daily. (Patient taking differently: Take 75 mg by mouth 2 (two) times a week. No specific days) 10/31/2020: Last taken 2-3 days ago  . hydrALAZINE (APRESOLINE) 25 MG tablet Take 25 mg tablet if blood pressure is equal or greater than 170 ( top number) up to 3 three times a day if needed (Patient not taking: Reported on 10/31/2020)   . latanoprost (XALATAN) 0.005 % ophthalmic solution Place 1 drop into both eyes at bedtime.     No facility-administered encounter medications on file as of 12/18/2020.    Current Diagnosis: Patient Active Problem List   Diagnosis Date Noted  . Knee pain 10/31/2020  . CAD (coronary artery disease) 10/31/2020  . Goals of care, counseling/discussion   . Palliative care by specialist   . ST elevation myocardial infarction (STEMI) of anterolateral wall ([place]) 10/01/2019  . Failure to thrive in adult 07/20/2019  . Dementia ([place]) 05/13/2019  . Moderate aortic stenosis by prior echocardiogram 01/28/2019  . Epistaxis, recurrent 01/28/2019  . Delayed cystic duct stump bile leak s/p ERCP/stent 01/06/2019 01/07/2019  . Protein-calorie malnutrition, severe 01/04/2019  . CKD (chronic kidney disease), stage III ([place]) 01/03/2019  . Pill dysphagia 01/02/2019  . Acute gangrenous cholecystitis s/p lap cholecystectomy 01/02/2019 01/01/2019  . CKD (chronic kidney disease) stage 4, GFR 15-29 ml/min (HCC) 01/01/2019  . Unintentional weight loss 01/01/2019  . History  of medication noncompliance 01/01/2019  . History of partial gastrectomy 01/01/2019  . Hyperlipidemia with target LDL less than 70 10/23/2018  . Psychiatric disturbance 04/15/2017  . Orthostatic hypotension 04/08/2017  . Acute kidney injury superimposed on chronic kidney disease ([place]) 04/02/2017  . Benign prostatic hyperplasia 03/11/2017  . Weakness   . Anemia of chronic renal failure, stage 4 (severe) ([place]) 11/27/2015  . GERD (gastroesophageal reflux disease) 01/24/2015  . Thrombocytopenia ([place]) 08/09/2014  . H/O Inferior STEMI (09/2013) emergent PCI with Promus DES to RCA (3.0 mm  x 28 mm - 3.2 mm) 10/05/2013  . 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 CAD S/P PC: DES in RCA with PTCA for ISR & recent staged overlapping DES stent for m-dRCA tenosis & PTCA of Culprit 99% D1 lesion; Moderate mLAD, 80% ostial OM2 stable 05/22/2008  . Essential hypertension 06/30/2007  . Left-sided carotid artery disease -- s/p CEA 04/06/2006    Class: History of    Goals Addressed   None     Follow-Up:  Pharmacist Review  I spoke patient's wife Nicholas Porter). I called the patient and discussed medication adherence with the patient, no issues at this time with current medication. She stated that he has been doing good currently. She states that he has some follow-up appointments. He was in the hospital in November for a fall. He has not fallen since then. The patient denies any side effects with his medication and denies any problems with his current pharmacy.   Maia Breslow, [place] Assistant [phone]

## 2020-12-20 ENCOUNTER — Telehealth: Payer: Self-pay | Admitting: Adult Health

## 2020-12-20 NOTE — Telephone Encounter (Signed)
Nicholas Porter is calling from [facility] and wanted to let the provider know that they had a delay in care for today. Patient wife stated that the patient is sick and wants to reschedule to the end of the week CB is [phone].

## 2020-12-20 NOTE — Telephone Encounter (Signed)
FYI

## 2020-12-27 ENCOUNTER — Telehealth: Payer: Self-pay | Admitting: Cardiology

## 2020-12-27 NOTE — Telephone Encounter (Signed)
Per Deirdre the pts wife refused services repeatedly 3 times again and they have decided to non-admit to home health.  Message sent to PCP.

## 2020-12-27 NOTE — Telephone Encounter (Signed)
Message routed to Dr. Ellyn Hack

## 2020-12-27 NOTE — Telephone Encounter (Signed)
Nicholas Porter with [facility] is requesting to speak with Dr. Ellyn Hack. She is requesting a verbal order from Dr. Ellyn Hack for the patient's re-enrollment in palliative care. Please return call to discuss at [phone].

## 2021-01-01 DIAGNOSIS — N184 Chronic kidney disease, stage 4 (severe): Secondary | ICD-10-CM | POA: Diagnosis not present

## 2021-01-01 DIAGNOSIS — I340 Nonrheumatic mitral (valve) insufficiency: Secondary | ICD-10-CM | POA: Diagnosis not present

## 2021-01-01 DIAGNOSIS — I213 ST elevation (STEMI) myocardial infarction of unspecified site: Secondary | ICD-10-CM | POA: Diagnosis not present

## 2021-01-01 DIAGNOSIS — N179 Acute kidney failure, unspecified: Secondary | ICD-10-CM | POA: Diagnosis not present

## 2021-01-01 DIAGNOSIS — I779 Disorder of arteries and arterioles, unspecified: Secondary | ICD-10-CM | POA: Diagnosis not present

## 2021-01-01 DIAGNOSIS — N2581 Secondary hyperparathyroidism of renal origin: Secondary | ICD-10-CM | POA: Diagnosis not present

## 2021-01-01 DIAGNOSIS — I129 Hypertensive chronic kidney disease with stage 1 through stage 4 chronic kidney disease, or unspecified chronic kidney disease: Secondary | ICD-10-CM | POA: Diagnosis not present

## 2021-01-01 DIAGNOSIS — N189 Chronic kidney disease, unspecified: Secondary | ICD-10-CM | POA: Diagnosis not present

## 2021-01-01 DIAGNOSIS — R634 Abnormal weight loss: Secondary | ICD-10-CM | POA: Diagnosis not present

## 2021-01-01 LAB — BASIC METABOLIC PANEL
BUN: 44 — AB (ref 4–21)
CO2: 20 (ref 13–22)
Chloride: 109 — AB (ref 99–108)
Creatinine: 1.8 — AB (ref 0.6–1.3)
Glucose: 94
Potassium: 4.7 (ref 3.4–5.3)
Sodium: 141 (ref 137–147)

## 2021-01-01 LAB — CBC AND DIFFERENTIAL: Hemoglobin: 12.6 — AB (ref 13.5–17.5)

## 2021-01-01 LAB — COMPREHENSIVE METABOLIC PANEL
Albumin: 4 (ref 3.5–5.0)
Calcium: 9.7 (ref 8.7–10.7)
GFR calc Af Amer: 39
GFR calc non Af Amer: 34

## 2021-01-03 NOTE — Telephone Encounter (Signed)
Manus Gunning with [facility] is calling to see if there has been any response from Dr. Ellyn Hack regarding this request. Please advise  Thank you!

## 2021-01-05 ENCOUNTER — Encounter: Payer: Self-pay | Admitting: Family Medicine

## 2021-01-05 NOTE — Telephone Encounter (Signed)
I called and discussed with [facility]--we will reenrolled for palliative care.  [place] Hew, MD

## 2021-01-29 ENCOUNTER — Ambulatory Visit: Payer: Medicare Other | Admitting: Cardiology

## 2021-01-29 ENCOUNTER — Telehealth: Payer: Self-pay | Admitting: Pharmacist

## 2021-01-29 NOTE — Chronic Care Management (AMB) (Signed)
Chronic Care Management Pharmacy Assistant   Name: Nicholas Porter  MRN: [id number] DOB: October 16, 1930  Reason for Encounter: General Adherence Call  PCP : Dorothyann Peng, NP  Allergies:   Allergies  Allergen Reactions  . Amlodipine Besylate Hives  . Lipitor [Atorvastatin] Itching  . Naproxen Diarrhea  . Oxycodone-Acetaminophen Itching  . Isosorbide Rash    Medications: Outpatient Encounter Medications as of 01/29/2021  Medication Sig Note  . clopidogrel (PLAVIX) 75 MG tablet Take 1 tablet (75 mg total) by mouth daily. (Patient taking differently: Take 75 mg by mouth 2 (two) times a week. No specific days) 10/31/2020: Last taken 2-3 days ago  . hydrALAZINE (APRESOLINE) 25 MG tablet Take 25 mg tablet if blood pressure is equal or greater than 170 ( top number) up to 3 three times a day if needed (Patient not taking: Reported on 10/31/2020)   . latanoprost (XALATAN) 0.005 % ophthalmic solution Place 1 drop into both eyes at bedtime.     No facility-administered encounter medications on file as of 01/29/2021.    Current Diagnosis: Patient Active Problem List   Diagnosis Date Noted  . Knee pain 10/31/2020  . CAD (coronary artery disease) 10/31/2020  . Goals of care, counseling/discussion   . Palliative care by specialist   . ST elevation myocardial infarction (STEMI) of anterolateral wall ([place]) 10/01/2019  . Failure to thrive in adult 07/20/2019  . Dementia ([place]) 05/13/2019  . Moderate aortic stenosis by prior echocardiogram 01/28/2019  . Epistaxis, recurrent 01/28/2019  . Delayed cystic duct stump bile leak s/p ERCP/stent 01/06/2019 01/07/2019  . Protein-calorie malnutrition, severe 01/04/2019  . CKD (chronic kidney disease), stage III ([place]) 01/03/2019  . Pill dysphagia 01/02/2019  . Acute gangrenous cholecystitis s/p lap cholecystectomy 01/02/2019 01/01/2019  . CKD (chronic kidney disease) stage 4, GFR 15-29 ml/min (HCC) 01/01/2019  . Unintentional weight loss 01/01/2019  .  History of medication noncompliance 01/01/2019  . History of partial gastrectomy 01/01/2019  . Hyperlipidemia with target LDL less than 70 10/23/2018  . Psychiatric disturbance 04/15/2017  . Orthostatic hypotension 04/08/2017  . Acute kidney injury superimposed on chronic kidney disease ([place]) 04/02/2017  . Benign prostatic hyperplasia 03/11/2017  . Weakness   . Anemia of chronic renal failure, stage 4 (severe) ([place]) 11/27/2015  . GERD (gastroesophageal reflux disease) 01/24/2015  . Thrombocytopenia ([place]) 08/09/2014  . H/O Inferior STEMI (09/2013) emergent PCI with Promus DES to RCA (3.0 mm  x 28 mm - 3.2 mm) 10/05/2013  . 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 CAD S/P PC: DES in RCA with PTCA for ISR & recent staged overlapping DES stent for m-dRCA tenosis & PTCA of Culprit 99% D1 lesion; Moderate mLAD, 80% ostial OM2 stable 05/22/2008  . Essential hypertension 06/30/2007  . Left-sided carotid artery disease -- s/p CEA 04/06/2006    Class: History of    Goals Addressed   None     Follow-Up:  Pharmacist Review and Scheduled Follow-Up With Clinical Pharmacist  I spoke with the patient and his wife and discussed medication adherence with the patient, no issues at this time with current medication. She states that he does not go out of the house much. He did have a fall, back in November 2021, but he is doing much better. A follow-up appointment with the CPP was scheduled for April. He denies any ED visits since November. The patient denies any side effects with his medication. Also, denies any problems with his current pharmacy.   Maia Breslow, [place]  Clinical Pharmacy Assistant [phone]

## 2021-02-01 ENCOUNTER — Telehealth: Payer: Self-pay | Admitting: Adult Health

## 2021-02-01 NOTE — Telephone Encounter (Signed)
Left message for patient to call back and schedule Medicare Annual Wellness Visit (AWV) either virtually or in office. Did not leave detailed message    Last AWV no information  please schedule at anytime with [facility] Nurse Health Advisor 1 or 2   This should be a 45 minute visit. Patient also needs appointment with pcp last appointment 07/07/2019

## 2021-02-06 ENCOUNTER — Encounter (HOSPITAL_COMMUNITY): Payer: Self-pay

## 2021-02-06 ENCOUNTER — Other Ambulatory Visit: Payer: Self-pay

## 2021-02-06 ENCOUNTER — Ambulatory Visit: Payer: Medicare Other | Admitting: Cardiology

## 2021-02-06 ENCOUNTER — Emergency Department (HOSPITAL_COMMUNITY): Payer: Medicare Other

## 2021-02-06 ENCOUNTER — Emergency Department (HOSPITAL_COMMUNITY)
Admission: EM | Admit: 2021-02-06 | Discharge: 2021-02-06 | Disposition: A | Discharge status: Home / Self Care | Payer: Medicare Other | Attending: Emergency Medicine | Admitting: Emergency Medicine

## 2021-02-06 DIAGNOSIS — I2510 Atherosclerotic heart disease of native coronary artery without angina pectoris: Secondary | ICD-10-CM | POA: Diagnosis not present

## 2021-02-06 DIAGNOSIS — R39198 Other difficulties with micturition: Secondary | ICD-10-CM | POA: Insufficient documentation

## 2021-02-06 DIAGNOSIS — I129 Hypertensive chronic kidney disease with stage 1 through stage 4 chronic kidney disease, or unspecified chronic kidney disease: Secondary | ICD-10-CM | POA: Diagnosis not present

## 2021-02-06 DIAGNOSIS — E039 Hypothyroidism, unspecified: Secondary | ICD-10-CM | POA: Insufficient documentation

## 2021-02-06 DIAGNOSIS — R339 Retention of urine, unspecified: Secondary | ICD-10-CM | POA: Diagnosis not present

## 2021-02-06 DIAGNOSIS — Z955 Presence of coronary angioplasty implant and graft: Secondary | ICD-10-CM | POA: Diagnosis not present

## 2021-02-06 DIAGNOSIS — N184 Chronic kidney disease, stage 4 (severe): Secondary | ICD-10-CM | POA: Diagnosis not present

## 2021-02-06 DIAGNOSIS — R109 Unspecified abdominal pain: Secondary | ICD-10-CM | POA: Insufficient documentation

## 2021-02-06 DIAGNOSIS — J449 Chronic obstructive pulmonary disease, unspecified: Secondary | ICD-10-CM | POA: Insufficient documentation

## 2021-02-06 DIAGNOSIS — N3289 Other specified disorders of bladder: Secondary | ICD-10-CM | POA: Diagnosis not present

## 2021-02-06 DIAGNOSIS — Z79899 Other long term (current) drug therapy: Secondary | ICD-10-CM | POA: Insufficient documentation

## 2021-02-06 DIAGNOSIS — R5381 Other malaise: Secondary | ICD-10-CM | POA: Diagnosis not present

## 2021-02-06 DIAGNOSIS — R410 Disorientation, unspecified: Secondary | ICD-10-CM | POA: Diagnosis not present

## 2021-02-06 DIAGNOSIS — Z87891 Personal history of nicotine dependence: Secondary | ICD-10-CM | POA: Diagnosis not present

## 2021-02-06 DIAGNOSIS — F0390 Unspecified dementia without behavioral disturbance: Secondary | ICD-10-CM | POA: Diagnosis not present

## 2021-02-06 DIAGNOSIS — R300 Dysuria: Secondary | ICD-10-CM | POA: Diagnosis not present

## 2021-02-06 DIAGNOSIS — K5730 Diverticulosis of large intestine without perforation or abscess without bleeding: Secondary | ICD-10-CM | POA: Diagnosis not present

## 2021-02-06 DIAGNOSIS — N400 Enlarged prostate without lower urinary tract symptoms: Secondary | ICD-10-CM | POA: Diagnosis not present

## 2021-02-06 LAB — CBC WITH DIFFERENTIAL/PLATELET
Abs Immature Granulocytes: 0 10*3/uL (ref 0.00–0.07)
Band Neutrophils: 0 %
Basophils Absolute: 0 10*3/uL (ref 0.0–0.1)
Basophils Relative: 1 %
Blasts: 0 %
Eosinophils Absolute: 0 10*3/uL (ref 0.0–0.5)
Eosinophils Relative: 1 %
HCT: 32.6 % — ABNORMAL LOW (ref 39.0–52.0)
Hemoglobin: 11.1 g/dL — ABNORMAL LOW (ref 13.0–17.0)
Lymphocytes Relative: 36 %
Lymphs Abs: 1.6 10*3/uL (ref 0.7–4.0)
MCH: 33.7 pg (ref 26.0–34.0)
MCHC: 34 g/dL (ref 30.0–36.0)
MCV: 99.1 fL (ref 80.0–100.0)
Metamyelocytes Relative: 0 %
Monocytes Absolute: 0.5 10*3/uL (ref 0.1–1.0)
Monocytes Relative: 11 %
Myelocytes: 0 %
Neutro Abs: 2.3 10*3/uL (ref 1.7–7.7)
Neutrophils Relative %: 51 %
Other: 0 %
Platelets: 77 10*3/uL — ABNORMAL LOW (ref 150–400)
Promyelocytes Relative: 0 %
RBC: 3.29 MIL/uL — ABNORMAL LOW (ref 4.22–5.81)
RDW: 15 % (ref 11.5–15.5)
WBC: 4.4 10*3/uL (ref 4.0–10.5)
nRBC: 0 % (ref 0.0–0.2)
nRBC: 0 /100 WBC

## 2021-02-06 LAB — URINALYSIS, ROUTINE W REFLEX MICROSCOPIC
Bacteria, UA: NONE SEEN
RBC / HPF: >50 RBC/hpf — ABNORMAL HIGH (ref 0–5)

## 2021-02-06 LAB — BASIC METABOLIC PANEL
Anion gap: 12 (ref 5–15)
BUN: 64 mg/dL — ABNORMAL HIGH (ref 8–23)
CO2: 17 mmol/L — ABNORMAL LOW (ref 22–32)
Calcium: 9.5 mg/dL (ref 8.9–10.3)
Chloride: 113 mmol/L — ABNORMAL HIGH (ref 98–111)
Creatinine, Ser: 2.27 mg/dL — ABNORMAL HIGH (ref 0.61–1.24)
GFR, Estimated: 27 mL/min — ABNORMAL LOW (ref >60)
Glucose, Bld: 88 mg/dL (ref 70–99)
Potassium: 4.7 mmol/L (ref 3.5–5.1)
Sodium: 142 mmol/L (ref 135–145)

## 2021-02-06 MED ORDER — SODIUM CHLORIDE 0.9 % IV SOLN
1.0000 g | Freq: Once | INTRAVENOUS | Status: AC
  Administered 2021-02-06: 1 g via INTRAVENOUS
  Filled 2021-02-06: qty 10

## 2021-02-06 MED ORDER — SODIUM CHLORIDE 0.9 % IV BOLUS
500.0000 mL | Freq: Once | INTRAVENOUS | Status: AC
  Administered 2021-02-06: 500 mL via INTRAVENOUS

## 2021-02-06 NOTE — ED Notes (Signed)
EDP at bedside at this time.  

## 2021-02-06 NOTE — ED Notes (Addendum)
PrimoFit placed on patient. Patient stated he needed to urinate.

## 2021-02-06 NOTE — ED Notes (Signed)
Attached leg bag to pt catheter, instructed pt and wife how to empty leg bag and how to attach bag/adjust if needed. Wife understood. Instructed pt and wife that catheter needs to stay in until pt sees urology. Showed wife Urologist ph # and address on discharge paperwork. Wife understood and stated she would call.

## 2021-02-06 NOTE — ED Notes (Signed)
Patient transported to CT 

## 2021-02-06 NOTE — ED Triage Notes (Signed)
Pt c/o dysuria x 24hrs, states it is painful to urinate and only a "little dribble comes out". Denies hx of prostate issues. Wife told EMS pt has dementia, pt answering questions appropriately.

## 2021-02-06 NOTE — ED Provider Notes (Signed)
Patient care assumed at 1600. Patient here for evaluation of difficulty urinating. He had urinary retention and a Foley catheter was placed. Care assumed pending CBC, UA. BMP with stable renal insufficiency. CBC demonstrated stable thrombocytopenia. UA with numerous RBCs, few leukocytes. No bacteria. Feel UTI is unlikely at this time. Will treat with one-time dose of antibiotics in setting cultures. Will treat if cultures are positive. Patient has no systemic symptoms concerning for sepsis. Discussed with patient and wife home care for urinary retention. Plan to discharge with catheter in place with urology follow-up and return precautions.   Quintella Reichert, MD 02/06/21 [phone]

## 2021-02-06 NOTE — ED Provider Notes (Signed)
[facility] DEPT Provider Note   CSN: [id number] Arrival date & time: 02/06/21  1138  LEVEL 5 CAVEAT - DEMENTIA  History Chief Complaint  Patient presents with  . Urinary Retention    Nicholas Porter is a 87 y.o. male.  HPI 87 year old male presents with difficulty urinating.  History is somewhat from patient but mostly from the wife.  Patient's history is limited due to his dementia.  For about a week he has been having difficulty urinating.  He is frequently getting up to the bathroom but only a couple drops seem to come out.  It also seems to hurt. Has felt warm but no temperature was taken. No vomiting.   Past Medical History:  Diagnosis Date  . Acute metabolic encephalopathy [id number]  . Acute renal failure superimposed on stage 4 chronic kidney disease ([place]) 11/27/2015  . Anemia of chronic renal failure, stage 4 (severe) ([place]) 11/27/2015  . Anxiety   . Arthritis    back  . 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 CAD S/P percutaneous coronary angioplasty - 09/2013   a) 09/2013 - Inf STEMI (Promus DES 3 x 28 DES mRCA); b 05/2014 - UnstAngina - 85% RCA ISR - scoring & [place] balloon PTCA; c) Anterolat STEMI - PTCA only of 99% D1 (small), staged DES (RESOLUTE ONYX DES 3 MM 18 MM) PCI of 95% mRCA lesion after initial stent - Overlapping DES  . Calcific aortic stenosis 10/01/2019   Echo shows functionally bicuspid aortic valve with calcified mild to moderate AS-mean gradient 19 mm, peak 34 mmHg.  Marland Kitchen Cholelithiasis with acute cholecystitis with biliary obstruction 05/2019   Cholecystectomy and biliary stent placed with ERCP  . CKD (chronic kidney disease) stage 3, GFR 30-59 ml/min (HCC)   . COPD (chronic obstructive pulmonary disease) ([place])   . Dementia ([place]) 05/13/2019  . Depression   . Essential hypertension 06/30/2007   Qualifier: Diagnosis of  By: Cori Razor RN, Mikal Plane Eye abnormalities    injections in eyes 12/2017  . H/O Inferior STEMI (09/2013) 10/05/2013   emergent PCI with Promus DES to RCA (3.0 mm  x 28 mm - 3.2 mm)  . History of Anterolateral STEMI 10/01/2019   D1 99% thrombotic lesion-PTCA only.  Had staged PCI of m RCA 95% lesion after prior stent.  . Hypothyroidism   . Klebsiella sepsis ([place]) 04/02/2017  . Left-sided carotid artery disease -- s/p CEA 04/06/2006   S/p Left CEA     Patient Active Problem List   Diagnosis Date Noted  . Knee pain 10/31/2020  . CAD (coronary artery disease) 10/31/2020  . Goals of care, counseling/discussion   . Palliative care by specialist   . ST elevation myocardial infarction (STEMI) of anterolateral wall ([place]) 10/01/2019  . Failure to thrive in adult 07/20/2019  . Dementia ([place]) 05/13/2019  . Moderate aortic stenosis by prior echocardiogram 01/28/2019  . Epistaxis, recurrent 01/28/2019  . Delayed cystic duct stump bile leak s/p ERCP/stent 01/06/2019 01/07/2019  . Protein-calorie malnutrition, severe 01/04/2019  . CKD (chronic kidney disease), stage III ([place]) 01/03/2019  . Pill dysphagia 01/02/2019  . Acute gangrenous cholecystitis s/p lap cholecystectomy 01/02/2019 01/01/2019  . CKD (chronic kidney disease) stage 4, GFR 15-29 ml/min (HCC) 01/01/2019  . Unintentional weight loss 01/01/2019  . History of medication noncompliance 01/01/2019  . History of partial gastrectomy 01/01/2019  . Hyperlipidemia with target LDL  less than 70 10/23/2018  . Psychiatric disturbance 04/15/2017  . Orthostatic hypotension 04/08/2017  . Acute kidney injury superimposed on chronic kidney disease ([place]) 04/02/2017  . Benign prostatic hyperplasia 03/11/2017  . Weakness   . Anemia of chronic renal failure, stage 4 (severe) ([place]) 11/27/2015  . GERD (gastroesophageal reflux disease) 01/24/2015  . Thrombocytopenia ([place]) 08/09/2014  . H/O Inferior STEMI (09/2013) emergent PCI with Promus DES to RCA (3.0 mm  x 28 mm - 3.2 mm)  10/05/2013  . 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 CAD S/P PC: DES in RCA with PTCA for ISR & recent staged overlapping DES stent for m-dRCA tenosis & PTCA of Culprit 99% D1 lesion; Moderate mLAD, 80% ostial OM2 stable 05/22/2008  . Essential hypertension 06/30/2007  . Left-sided carotid artery disease -- s/p CEA 04/06/2006    Class: History of    Past Surgical History:  Procedure Laterality Date  . AMPUTATION     left index finger -tramatic  . BILIARY STENT PLACEMENT N/A 01/06/2019   Procedure: BILIARY STENT PLACEMENT;  Surgeon: Clarene Essex, MD;  Location: [facility];  Service: Endoscopy;  Laterality: N/A;  . CAROTID ENDARTERECTOMY Left 2007   Dr.Hayes: 08/2016 Dopplers: Stable less than 40% right internal carotid stenosis and stable moderate 40-60% left internal carotid stenosis. Patent vertebral and subclavian arteries.  . CARPAL TUNNEL RELEASE    . CHOLECYSTECTOMY N/A 01/02/2019   Procedure: LAPAROSCOPIC CHOLECYSTECTOMY;  Surgeon: Michael Boston, MD;  Location: [facility];  Service: General;  Laterality: N/A;  . CORNEAL TRANSPLANT    . CORONARY ANGIOPLASTY WITH STENT PLACEMENT  09/2013; 05/2014    d) Promus DES 3.0 mm x 28 mm (3.2 mm)  to RCA with STEMI; residual 70% mid-distal LAD, OM 270-80%. Medical management; b) PTCA of 85 % mRCA ISR (3.25 mm scoring balloon, 3.5 mm [place] balloon), LAD lesion noted as ~40%  . CORONARY STENT INTERVENTION N/A 10/08/2019   Procedure: CORONARY STENT INTERVENTION;  Surgeon: Wellington Hampshire, MD;  Location: [facility] CV LAB; ; 95% mRCA after previous stent--overlapping DES PCI: Resolute Onyx 3.0 mm 18 mm  . CORONARY/GRAFT ACUTE MI REVASCULARIZATION N/A 10/01/2019   Procedure: Coronary/Graft Acute MI Revascularization;  Surgeon: Burnell Blanks, MD;  Location: [facility] CV LAB; ANTEROLATERAL STEMI: PTCA only of 99% subtotal occluded D1;  . ERCP N/A 01/06/2019   Procedure: ENDOSCOPIC RETROGRADE CHOLANGIOPANCREATOGRAPHY (ERCP);  Surgeon: Clarene Essex, MD;   Location: [facility];  Service: Endoscopy;  Laterality: N/A;  . ERCP N/A 05/28/2019   Procedure: ENDOSCOPIC RETROGRADE CHOLANGIOPANCREATOGRAPHY (ERCP);  Surgeon: Clarene Essex, MD;  Location: [facility];  Service: Endoscopy;  Laterality: N/A;  . FOOT SURGERY     left  . HERNIA REPAIR    . LEFT HEART CATH AND CORONARY ANGIOGRAPHY N/A 10/01/2019   Procedure: LEFT HEART CATH AND CORONARY ANGIOGRAPHY;  Surgeon: Burnell Blanks, MD;  Location: [facility] LAB;    . LEFT HEART CATH AND CORONARY ANGIOGRAPHY  11/11/2010   Procedure: LEFT HEART CATH AND CORONARY ANGIOGRAPHY;  Surgeon: Leonie Man, MD;  Location: [facility] CV LAB; 40% mid LAD, 50% proximal RCA, 30-40% distal RCA (DOMINANT RCA with wraparound PDA & 2 large PLBs), 60-70% ostial OM1. No change from 2001. Medical therapy  . LEFT HEART CATHETERIZATION WITH CORONARY ANGIOGRAM N/A 10/05/2013   Procedure: LEFT HEART CATHETERIZATION WITH CORONARY ANGIOGRAM;  Surgeon: Peter M Martinique, MD;  Location: [facility] CATH LAB;  Service: Cardiovascular; INFERIOR STEMI: RCA 100% - PCI,CxOM stable, LAD ~70% mid;  .  LEFT HEART CATHETERIZATION WITH CORONARY ANGIOGRAM N/A 06/08/2014   Procedure: LEFT HEART CATHETERIZATION WITH CORONARY ANGIOGRAM;  Surgeon: Troy Sine, MD;  Location: [facility] CATH LAB; UA 6/'15: 75% ISR in RCA - PTCA, LAD lesion ~40%, CxOM stable.    Marland Kitchen NM MYOVIEW LTD  03/2017    - Elevatred Troponin in setting of Sepsis -- DEMAND ISCHEMIA.  Normal perfusion. LVEF 51% with normal wall motion. This is a low risk study.  Marland Kitchen PARTIAL GASTRECTOMY    . PERCUTANEOUS CORONARY INTERVENTION-BALLOON ONLY  06/08/2014   Procedure: PERCUTANEOUS CORONARY INTERVENTION-BALLOON ONLY;  Surgeon: Troy Sine, MD;  Location: [facility] CATH LAB;  Service: Cardiovascular;;  . REMOVAL OF STONES  05/28/2019   Procedure: REMOVAL OF STONES;  Surgeon: Clarene Essex, MD;  Location: [facility];  Service: Endoscopy;;  . ROTATOR CUFF REPAIR     left  . SHOULDER ARTHROSCOPY WITH  ROTATOR CUFF REPAIR AND SUBACROMIAL DECOMPRESSION Right 07/01/2013   Procedure: RIGHT SHOULDER ARTHROSCOPY WITH  SUBACROMIAL DECOMPRESSION/DISTAL CLAVICLE RESECTION/ROTATOR CUFF REPAIR ;  Surgeon: Marin Shutter, MD;  Location: [facility];  Service: Orthopedics;  Laterality: Right;  . SPHINCTEROTOMY  01/06/2019   Procedure: SPHINCTEROTOMY;  Surgeon: Clarene Essex, MD;  Location: [facility];  Service: Endoscopy;;  . SPINE SURGERY  1985   cervical laminectomy  . STENT REMOVAL  05/28/2019   Procedure: STENT REMOVAL;  Surgeon: Clarene Essex, MD;  Location: [facility];  Service: Endoscopy;;  . TOTAL KNEE ARTHROPLASTY    . TRANSTHORACIC ECHOCARDIOGRAM  09/'17; 4/'18; 1/'20   a) Normal EF 55-60%. Mild to moderate AS with mod AI. Mild MR.;; b) Mild global reduction in LV systolic function: [id number]; grade 1 DD - high LVEDP. Mild AS/AI/TR. Mild-Mod MR; moderately elevated PAP.;; c) TTE 12/2018: EF 55-60%. No RWMA. Gr1 DD. Mod AS with mild AI. Severe LA dilation.  . TRANSTHORACIC ECHOCARDIOGRAM  10/01/2019   ANTEROLATERAL STEMI: EF 45 to 50%.  GR 1 DD.  Normal atrial sizes.  Mild mitral stenosis..  Functionally bicuspid aortic valve with moderate AS (mean gradient 19 mmHg, peak 34 mmHg).  Marland Kitchen VAGOTOMY     partial gastrectomy       Family History  Problem Relation Age of Onset  . Heart disease Sister        Heart Transplant   . Diabetes Sister     Social History   Tobacco Use  . Smoking status: Former Smoker    Years: 15.00    Types: Cigarettes    Quit date: 12/23/1964    Years since quitting: 56.1  . Smokeless tobacco: Never Used  Vaping Use  . Vaping Use: Never used  Substance Use Topics  . Alcohol use: No  . Drug use: No    Home Medications Prior to Admission medications   Medication Sig Start Date End Date Taking? Authorizing Provider  clopidogrel (PLAVIX) 75 MG tablet Take 1 tablet (75 mg total) by mouth daily. Patient taking differently: Take 75 mg by mouth 2 (two) times a week. No specific  days 02/23/20   Leonie Man, MD  hydrALAZINE (APRESOLINE) 25 MG tablet Take 25 mg tablet if blood pressure is equal or greater than 170 ( top number) up to 3 three times a day if needed Patient not taking: Reported on 10/31/2020 08/29/20   Leonie Man, MD  latanoprost (XALATAN) 0.005 % ophthalmic solution Place 1 drop into both eyes at bedtime.     [provider]    Allergies    Amlodipine  besylate, Lipitor [atorvastatin], Naproxen, Oxycodone-acetaminophen, and Isosorbide  Review of Systems   Review of Systems  Unable to perform ROS: Dementia    Physical Exam Updated Vital Signs BP (!) 164/70   Pulse 68   Temp 97.6 F (36.4 C) (Oral)   Resp 20   Ht '5\' 7"'$  (1.702 m)   Wt 39.5 kg   SpO2 98%   BMI 13.63 kg/m   Physical Exam Vitals and nursing note reviewed.  Constitutional:      General: He is not in acute distress.    Appearance: He is well-developed and well-nourished. He is not ill-appearing or diaphoretic.  HENT:     Head: Normocephalic and atraumatic.     Right Ear: External ear normal.     Left Ear: External ear normal.     Nose: Nose normal.  Eyes:     General:        Right eye: No discharge.        Left eye: No discharge.  Cardiovascular:     Rate and Rhythm: Normal rate and regular rhythm.     Heart sounds: Normal heart sounds.  Pulmonary:     Effort: Pulmonary effort is normal.     Breath sounds: Normal breath sounds.  Abdominal:     General: There is no distension.     Palpations: Abdomen is soft.     Tenderness: There is no abdominal tenderness. There is left CVA tenderness.  Genitourinary:    Penis: No tenderness.      Testes:        Right: Tenderness not present.        Left: Tenderness not present.  Musculoskeletal:        General: No edema.     Cervical back: Neck supple.  Skin:    General: Skin is warm and dry.  Neurological:     Mental Status: He is alert.  Psychiatric:        Mood and Affect: Mood is not anxious.     ED  Results / Procedures / Treatments   Labs (all labs ordered are listed, but only abnormal results are displayed) Labs Reviewed  BASIC METABOLIC PANEL - Abnormal; Notable for the following components:      Result Value   Chloride 113 (*)    CO2 17 (*)    BUN 64 (*)    Creatinine, Ser 2.27 (*)    GFR, Estimated 27 (*)    All other components within normal limits  URINE CULTURE  CBC WITH DIFFERENTIAL/PLATELET  URINALYSIS, ROUTINE W REFLEX MICROSCOPIC  CBC WITH DIFFERENTIAL/PLATELET    EKG None  Radiology CT Renal Stone Study  Result Date: 02/06/2021 CLINICAL DATA:  Dysuria. EXAM: CT ABDOMEN AND PELVIS WITHOUT CONTRAST TECHNIQUE: Multidetector CT imaging of the abdomen and pelvis was performed following the standard protocol without IV contrast. COMPARISON:  01/01/2019 FINDINGS: Lower chest: Chest 2 mm nodule in the lingula is stable in the interval consistent with benign etiology. Hepatobiliary: No focal abnormality in the liver on this study without intravenous contrast. Gallbladder surgically absent. No intrahepatic or extrahepatic biliary dilation. Pancreas: No focal mass lesion. No dilatation of the main duct. No intraparenchymal cyst. No peripancreatic edema. Spleen: No splenomegaly. No focal mass lesion. Adrenals/Urinary Tract: No adrenal nodule or mass. No renal stones. No hydronephrosis or secondary changes in either kidney. No evidence for hydroureter. No discernible ureteral stone disease. Bladder is moderately distended without stones evident. Subtle trabeculation or potentially urothelial lesion noted posterior right  bladder (axial 62/2 Stomach/Bowel: Surgical changes noted in the region of the esophagogastric junction. Stomach otherwise unremarkable. No gastric wall thickening. No evidence of outlet obstruction. Duodenum is normally positioned as is the ligament of Treitz. No small bowel wall thickening. No small bowel dilatation. Colon is nondilated. Diverticular changes noted left  colon without evidence of diverticulitis. Vascular/Lymphatic: There is abdominal aortic atherosclerosis without aneurysm. There is no gastrohepatic or hepatoduodenal ligament lymphadenopathy. No retroperitoneal or mesenteric lymphadenopathy. No pelvic sidewall lymphadenopathy. Reproductive: Prostate gland is enlarged. Other: No intraperitoneal free fluid. Musculoskeletal: Bones are demineralized. No worrisome lytic or sclerotic osseous abnormality. Degenerative changes evident in the hips. IMPRESSION: 1. Moderate bladder distension with subtle trabeculation or potentially urothelial lesion noted posterior right bladder mucosa. 2. Prostatomegaly. 3. Left colonic diverticulosis without diverticulitis. 4. Aortic Atherosclerosis (ICD10-I70.0). Electronically Signed   By: Misty Stanley M.D.   On: 02/06/2021 13:34    Procedures Procedures   Medications Ordered in ED Medications  sodium chloride 0.9 % bolus 500 mL (0 mLs Intravenous Stopped 02/06/21 1359)    ED Course  I have reviewed the triage vital signs and the nursing notes.  Pertinent labs & imaging results that were available during my care of the patient were reviewed by me and considered in my medical decision making (see chart for details).    MDM Rules/Calculators/A&P                          Patient has mildly increased Cr from baseline. Given fluids and after CT he had a foley placed. Initially only a few drops of urine came out. Nurse replaced the Foley catheter and now is having a large amount of urine out though there is some blood (unclear if from bladder pathology versus the original catheter).  However now that he is draining I suspect his creatinine will improve.  Waiting on CBC and urinalysis.  CT renal studies otherwise benign.  Care to Dr. Ralene Bathe. Final Clinical Impression(s) / ED Diagnoses Final diagnoses:  None    Rx / DC Orders ED Discharge Orders    None       Sherwood Gambler, MD 02/06/21 [phone]

## 2021-02-09 LAB — URINE CULTURE: Culture: 80000 — AB

## 2021-02-10 NOTE — Progress Notes (Signed)
ED Antimicrobial Stewardship Positive Culture Follow Up   Nicholas Porter is an 87 y.o. male who presented to [facility] on 02/06/2021 with a chief complaint of urinary retention, dysuria. Chief Complaint  Patient presents with  . Urinary Retention    Recent Results (from the past 720 hour(s))  Urine culture     Status: Abnormal   Collection Time: 02/06/21  4:00 PM   Specimen: Urine, Random  Result Value Ref Range Status   Specimen Description   Final    URINE, RANDOM Performed at [facility] [address], [place], [place] [address]    Special Requests   Final    NONE Performed at [facility] [place] [address], [place], [place] [address]    Culture 80,000 COLONIES/mL ENTEROCOCCUS FAECALIS (A)  Final   Report Status 02/09/2021 FINAL  Final   Organism ID, Bacteria ENTEROCOCCUS FAECALIS (A)  Final      Susceptibility   Enterococcus faecalis - MIC*    AMPICILLIN <=2 SENSITIVE Sensitive     NITROFURANTOIN <=16 SENSITIVE Sensitive     VANCOMYCIN 2 SENSITIVE Sensitive     * 80,000 COLONIES/mL ENTEROCOCCUS FAECALIS    '[]'$  Treated with , organism resistant to prescribed antimicrobial '[x]'$  Patient discharged originally without antimicrobial agent and treatment is now indicated  New antibiotic prescription: Amoxicillin '500mg'$  bid x7 days  ED Provider: Delia Heady, PA-C   Dia Sitter P 02/10/2021, 11:19 AM Clinical Pharmacist [phone]

## 2021-02-11 ENCOUNTER — Telehealth (HOSPITAL_BASED_OUTPATIENT_CLINIC_OR_DEPARTMENT_OTHER): Payer: Self-pay | Admitting: Emergency Medicine

## 2021-02-11 NOTE — Telephone Encounter (Signed)
Post ED Visit - Positive Culture Follow-up: Unsuccessful Patient Follow-up  Culture assessed and recommendations reviewed by:  '[]'$  Elenor Quinones, Pharm.D. '[]'$  Heide Guile, Pharm.D., BCPS AQ-ID '[]'$  Parks Neptune, Pharm.D., BCPS '[]'$  Alycia Rossetti, Pharm.D., BCPS '[]'$  [place], [place].D., BCPS, AAHIVP '[]'$  Legrand Como, Pharm.D., BCPS, AAHIVP '[x]'$  Dia Sitter, PharmD '[]'$  [place] Hews, PharmD, BCPS  Positive urine culture  '[x]'$  Patient discharged without antimicrobial prescription and treatment is now indicated '[]'$  Organism is resistant to prescribed ED discharge antimicrobial '[]'$  Patient with positive blood cultures   Unable to contact patient/spouse Janie @ phone number on file, letter will be sent to address on file.  Plan:  Amoxicillin 500 mg PO twice daily x seven days. Hina Khatri PA   Shima Compere C Jericka Kadar 02/11/2021, 5:42 PM

## 2021-02-13 ENCOUNTER — Encounter: Payer: Self-pay | Admitting: *Deleted

## 2021-02-13 NOTE — Progress Notes (Signed)
° ° °  Pt was admitted to [facility]the [facility] division of [facility]--in the home on 02/12/21.  Pt will be receiving nurse visits 1-3 x/month and home-based social worker support as well.  Ronnell [place] Referral Specialist [phone]

## 2021-02-27 ENCOUNTER — Telehealth: Payer: Self-pay | Admitting: Cardiology

## 2021-02-27 NOTE — Telephone Encounter (Signed)
called office --office is closed at present will try again tomorrow.

## 2021-02-27 NOTE — Telephone Encounter (Signed)
Routed to [facility] MD/Sharon RN regarding hospice

## 2021-02-27 NOTE — Telephone Encounter (Signed)
Manus Gunning from Hospice states the patient is ready to transfer to hospice services. She states they need a call back to make sure Dr. Ellyn Hack is agreeable.

## 2021-02-28 NOTE — Telephone Encounter (Signed)
Agree 

## 2021-02-28 NOTE — Telephone Encounter (Signed)
Spoke to [place]. She states patient is now in the pallative services and patient is declining and wife would for him to switch to hospice service.  Hospice service would need Dr Allison Quarry order. Informed Manus Gunning will contact her back with answer once Dr Ellyn Hack review information.

## 2021-03-01 DIAGNOSIS — R3914 Feeling of incomplete bladder emptying: Secondary | ICD-10-CM | POA: Diagnosis not present

## 2021-03-01 DIAGNOSIS — N401 Enlarged prostate with lower urinary tract symptoms: Secondary | ICD-10-CM | POA: Diagnosis not present

## 2021-03-02 NOTE — Telephone Encounter (Signed)
Spoke to [place] , Manus Gunning is out of office.  Per Dr Ellyn Hack,  It is okay to transfer to Hospice service , but contact patient 's primary to become Primary attending.  Ebony Hail states she will inform of Manus Gunning the verbal order.

## 2021-03-07 DIAGNOSIS — R3914 Feeling of incomplete bladder emptying: Secondary | ICD-10-CM | POA: Diagnosis not present

## 2021-03-08 DIAGNOSIS — R3914 Feeling of incomplete bladder emptying: Secondary | ICD-10-CM | POA: Diagnosis not present

## 2021-03-08 DIAGNOSIS — N401 Enlarged prostate with lower urinary tract symptoms: Secondary | ICD-10-CM | POA: Diagnosis not present

## 2021-03-09 DIAGNOSIS — K219 Gastro-esophageal reflux disease without esophagitis: Secondary | ICD-10-CM | POA: Diagnosis not present

## 2021-03-09 DIAGNOSIS — H409 Unspecified glaucoma: Secondary | ICD-10-CM | POA: Diagnosis not present

## 2021-03-09 DIAGNOSIS — N400 Enlarged prostate without lower urinary tract symptoms: Secondary | ICD-10-CM | POA: Diagnosis not present

## 2021-03-09 DIAGNOSIS — Z955 Presence of coronary angioplasty implant and graft: Secondary | ICD-10-CM | POA: Diagnosis not present

## 2021-03-09 DIAGNOSIS — I252 Old myocardial infarction: Secondary | ICD-10-CM | POA: Diagnosis not present

## 2021-03-09 DIAGNOSIS — N184 Chronic kidney disease, stage 4 (severe): Secondary | ICD-10-CM | POA: Diagnosis not present

## 2021-03-09 DIAGNOSIS — R64 Cachexia: Secondary | ICD-10-CM | POA: Diagnosis not present

## 2021-03-09 DIAGNOSIS — J449 Chronic obstructive pulmonary disease, unspecified: Secondary | ICD-10-CM | POA: Diagnosis not present

## 2021-03-09 DIAGNOSIS — F0390 Unspecified dementia without behavioral disturbance: Secondary | ICD-10-CM | POA: Diagnosis not present

## 2021-03-09 DIAGNOSIS — E43 Unspecified severe protein-calorie malnutrition: Secondary | ICD-10-CM | POA: Diagnosis not present

## 2021-03-09 DIAGNOSIS — K902 Blind loop syndrome, not elsewhere classified: Secondary | ICD-10-CM | POA: Diagnosis not present

## 2021-03-09 DIAGNOSIS — Z681 Body mass index (BMI) 19 or less, adult: Secondary | ICD-10-CM | POA: Diagnosis not present

## 2021-03-10 DIAGNOSIS — J449 Chronic obstructive pulmonary disease, unspecified: Secondary | ICD-10-CM | POA: Diagnosis not present

## 2021-03-10 DIAGNOSIS — E43 Unspecified severe protein-calorie malnutrition: Secondary | ICD-10-CM | POA: Diagnosis not present

## 2021-03-10 DIAGNOSIS — I252 Old myocardial infarction: Secondary | ICD-10-CM | POA: Diagnosis not present

## 2021-03-10 DIAGNOSIS — Z955 Presence of coronary angioplasty implant and graft: Secondary | ICD-10-CM | POA: Diagnosis not present

## 2021-03-10 DIAGNOSIS — Z681 Body mass index (BMI) 19 or less, adult: Secondary | ICD-10-CM | POA: Diagnosis not present

## 2021-03-10 DIAGNOSIS — R64 Cachexia: Secondary | ICD-10-CM | POA: Diagnosis not present

## 2021-03-12 DIAGNOSIS — Z955 Presence of coronary angioplasty implant and graft: Secondary | ICD-10-CM | POA: Diagnosis not present

## 2021-03-12 DIAGNOSIS — E43 Unspecified severe protein-calorie malnutrition: Secondary | ICD-10-CM | POA: Diagnosis not present

## 2021-03-12 DIAGNOSIS — I252 Old myocardial infarction: Secondary | ICD-10-CM | POA: Diagnosis not present

## 2021-03-12 DIAGNOSIS — Z681 Body mass index (BMI) 19 or less, adult: Secondary | ICD-10-CM | POA: Diagnosis not present

## 2021-03-12 DIAGNOSIS — R64 Cachexia: Secondary | ICD-10-CM | POA: Diagnosis not present

## 2021-03-12 DIAGNOSIS — J449 Chronic obstructive pulmonary disease, unspecified: Secondary | ICD-10-CM | POA: Diagnosis not present

## 2021-03-16 DIAGNOSIS — I252 Old myocardial infarction: Secondary | ICD-10-CM | POA: Diagnosis not present

## 2021-03-16 DIAGNOSIS — E43 Unspecified severe protein-calorie malnutrition: Secondary | ICD-10-CM | POA: Diagnosis not present

## 2021-03-16 DIAGNOSIS — R64 Cachexia: Secondary | ICD-10-CM | POA: Diagnosis not present

## 2021-03-16 DIAGNOSIS — Z955 Presence of coronary angioplasty implant and graft: Secondary | ICD-10-CM | POA: Diagnosis not present

## 2021-03-16 DIAGNOSIS — Z681 Body mass index (BMI) 19 or less, adult: Secondary | ICD-10-CM | POA: Diagnosis not present

## 2021-03-16 DIAGNOSIS — J449 Chronic obstructive pulmonary disease, unspecified: Secondary | ICD-10-CM | POA: Diagnosis not present

## 2021-03-20 DIAGNOSIS — E43 Unspecified severe protein-calorie malnutrition: Secondary | ICD-10-CM | POA: Diagnosis not present

## 2021-03-20 DIAGNOSIS — Z955 Presence of coronary angioplasty implant and graft: Secondary | ICD-10-CM | POA: Diagnosis not present

## 2021-03-20 DIAGNOSIS — R64 Cachexia: Secondary | ICD-10-CM | POA: Diagnosis not present

## 2021-03-20 DIAGNOSIS — J449 Chronic obstructive pulmonary disease, unspecified: Secondary | ICD-10-CM | POA: Diagnosis not present

## 2021-03-20 DIAGNOSIS — I252 Old myocardial infarction: Secondary | ICD-10-CM | POA: Diagnosis not present

## 2021-03-20 DIAGNOSIS — Z681 Body mass index (BMI) 19 or less, adult: Secondary | ICD-10-CM | POA: Diagnosis not present

## 2021-03-23 DIAGNOSIS — K902 Blind loop syndrome, not elsewhere classified: Secondary | ICD-10-CM | POA: Diagnosis not present

## 2021-03-23 DIAGNOSIS — R64 Cachexia: Secondary | ICD-10-CM | POA: Diagnosis not present

## 2021-03-23 DIAGNOSIS — Z955 Presence of coronary angioplasty implant and graft: Secondary | ICD-10-CM | POA: Diagnosis not present

## 2021-03-23 DIAGNOSIS — N184 Chronic kidney disease, stage 4 (severe): Secondary | ICD-10-CM | POA: Diagnosis not present

## 2021-03-23 DIAGNOSIS — F0390 Unspecified dementia without behavioral disturbance: Secondary | ICD-10-CM | POA: Diagnosis not present

## 2021-03-23 DIAGNOSIS — K219 Gastro-esophageal reflux disease without esophagitis: Secondary | ICD-10-CM | POA: Diagnosis not present

## 2021-03-23 DIAGNOSIS — N400 Enlarged prostate without lower urinary tract symptoms: Secondary | ICD-10-CM | POA: Diagnosis not present

## 2021-03-23 DIAGNOSIS — E43 Unspecified severe protein-calorie malnutrition: Secondary | ICD-10-CM | POA: Diagnosis not present

## 2021-03-23 DIAGNOSIS — Z681 Body mass index (BMI) 19 or less, adult: Secondary | ICD-10-CM | POA: Diagnosis not present

## 2021-03-23 DIAGNOSIS — I252 Old myocardial infarction: Secondary | ICD-10-CM | POA: Diagnosis not present

## 2021-03-23 DIAGNOSIS — H409 Unspecified glaucoma: Secondary | ICD-10-CM | POA: Diagnosis not present

## 2021-03-23 DIAGNOSIS — J449 Chronic obstructive pulmonary disease, unspecified: Secondary | ICD-10-CM | POA: Diagnosis not present

## 2021-03-27 DIAGNOSIS — J449 Chronic obstructive pulmonary disease, unspecified: Secondary | ICD-10-CM | POA: Diagnosis not present

## 2021-03-27 DIAGNOSIS — Z955 Presence of coronary angioplasty implant and graft: Secondary | ICD-10-CM | POA: Diagnosis not present

## 2021-03-27 DIAGNOSIS — Z681 Body mass index (BMI) 19 or less, adult: Secondary | ICD-10-CM | POA: Diagnosis not present

## 2021-03-27 DIAGNOSIS — R64 Cachexia: Secondary | ICD-10-CM | POA: Diagnosis not present

## 2021-03-27 DIAGNOSIS — I252 Old myocardial infarction: Secondary | ICD-10-CM | POA: Diagnosis not present

## 2021-03-27 DIAGNOSIS — E43 Unspecified severe protein-calorie malnutrition: Secondary | ICD-10-CM | POA: Diagnosis not present

## 2021-03-30 DIAGNOSIS — Z681 Body mass index (BMI) 19 or less, adult: Secondary | ICD-10-CM | POA: Diagnosis not present

## 2021-03-30 DIAGNOSIS — E43 Unspecified severe protein-calorie malnutrition: Secondary | ICD-10-CM | POA: Diagnosis not present

## 2021-03-30 DIAGNOSIS — R64 Cachexia: Secondary | ICD-10-CM | POA: Diagnosis not present

## 2021-03-30 DIAGNOSIS — I252 Old myocardial infarction: Secondary | ICD-10-CM | POA: Diagnosis not present

## 2021-03-30 DIAGNOSIS — Z955 Presence of coronary angioplasty implant and graft: Secondary | ICD-10-CM | POA: Diagnosis not present

## 2021-03-30 DIAGNOSIS — J449 Chronic obstructive pulmonary disease, unspecified: Secondary | ICD-10-CM | POA: Diagnosis not present

## 2021-04-03 ENCOUNTER — Telehealth: Payer: Self-pay | Admitting: Pharmacist

## 2021-04-03 ENCOUNTER — Encounter (HOSPITAL_COMMUNITY): Payer: Self-pay

## 2021-04-03 ENCOUNTER — Other Ambulatory Visit: Payer: Self-pay

## 2021-04-03 ENCOUNTER — Emergency Department (HOSPITAL_COMMUNITY)
Admission: EM | Admit: 2021-04-03 | Discharge: 2021-04-03 | Disposition: A | Discharge status: Home / Self Care | Attending: Emergency Medicine | Admitting: Emergency Medicine

## 2021-04-03 DIAGNOSIS — E039 Hypothyroidism, unspecified: Secondary | ICD-10-CM | POA: Insufficient documentation

## 2021-04-03 DIAGNOSIS — W010XXA Fall on same level from slipping, tripping and stumbling without subsequent striking against object, initial encounter: Secondary | ICD-10-CM | POA: Insufficient documentation

## 2021-04-03 DIAGNOSIS — R64 Cachexia: Secondary | ICD-10-CM | POA: Diagnosis not present

## 2021-04-03 DIAGNOSIS — R410 Disorientation, unspecified: Secondary | ICD-10-CM | POA: Diagnosis not present

## 2021-04-03 DIAGNOSIS — E43 Unspecified severe protein-calorie malnutrition: Secondary | ICD-10-CM | POA: Diagnosis not present

## 2021-04-03 DIAGNOSIS — Y92009 Unspecified place in unspecified non-institutional (private) residence as the place of occurrence of the external cause: Secondary | ICD-10-CM | POA: Diagnosis not present

## 2021-04-03 DIAGNOSIS — L858 Other specified epidermal thickening: Secondary | ICD-10-CM | POA: Insufficient documentation

## 2021-04-03 DIAGNOSIS — Z79899 Other long term (current) drug therapy: Secondary | ICD-10-CM | POA: Diagnosis not present

## 2021-04-03 DIAGNOSIS — F0390 Unspecified dementia without behavioral disturbance: Secondary | ICD-10-CM | POA: Diagnosis not present

## 2021-04-03 DIAGNOSIS — W19XXXA Unspecified fall, initial encounter: Secondary | ICD-10-CM | POA: Diagnosis not present

## 2021-04-03 DIAGNOSIS — Z681 Body mass index (BMI) 19 or less, adult: Secondary | ICD-10-CM | POA: Diagnosis not present

## 2021-04-03 DIAGNOSIS — N184 Chronic kidney disease, stage 4 (severe): Secondary | ICD-10-CM | POA: Insufficient documentation

## 2021-04-03 DIAGNOSIS — I129 Hypertensive chronic kidney disease with stage 1 through stage 4 chronic kidney disease, or unspecified chronic kidney disease: Secondary | ICD-10-CM | POA: Insufficient documentation

## 2021-04-03 DIAGNOSIS — I2510 Atherosclerotic heart disease of native coronary artery without angina pectoris: Secondary | ICD-10-CM | POA: Diagnosis not present

## 2021-04-03 DIAGNOSIS — Z87891 Personal history of nicotine dependence: Secondary | ICD-10-CM | POA: Insufficient documentation

## 2021-04-03 DIAGNOSIS — I252 Old myocardial infarction: Secondary | ICD-10-CM | POA: Diagnosis not present

## 2021-04-03 DIAGNOSIS — R0902 Hypoxemia: Secondary | ICD-10-CM | POA: Diagnosis not present

## 2021-04-03 DIAGNOSIS — J449 Chronic obstructive pulmonary disease, unspecified: Secondary | ICD-10-CM | POA: Insufficient documentation

## 2021-04-03 DIAGNOSIS — Z955 Presence of coronary angioplasty implant and graft: Secondary | ICD-10-CM | POA: Diagnosis not present

## 2021-04-03 DIAGNOSIS — R279 Unspecified lack of coordination: Secondary | ICD-10-CM | POA: Diagnosis not present

## 2021-04-03 DIAGNOSIS — Z743 Need for continuous supervision: Secondary | ICD-10-CM | POA: Diagnosis not present

## 2021-04-03 NOTE — ED Notes (Signed)
[facility] contacted for transport 

## 2021-04-03 NOTE — Progress Notes (Signed)
    thank you for your help with this pt. He is a active pt with [facility]. We were unfortunately not notified that pt had a fall or needed any assistance last evening when his fall happen. We will continue to work with helping the wife remember to call hospice for concerns to help prevent unnecessary hospital visits.   Our home care nursing will follow up later today. Thank you again for your help. Webb Silversmith RN  [phone]

## 2021-04-03 NOTE — ED Notes (Signed)
Spoke with Pts spouse, advised her of plan to D/C and transport home.

## 2021-04-03 NOTE — ED Triage Notes (Signed)
Pt to ED by EMS from home with following fall this evening. Pt tripped and fell this evening per wife. C/o R arm and shoulder pain. Arrives A+O, VSS, NADN.

## 2021-04-03 NOTE — ED Provider Notes (Signed)
[place] DEPT Provider Note: Georgena Spurling, MD, FACEP  CSN: [id number] MRN: [id number] ARRIVAL: 04/03/21 at [place]: [place] OF PRESENT ILLNESS  04/03/21 1:02 AM Nicholas Porter is a 87 y.o. male who fell at home just prior to arrival.  Per EMS he tripped and fell.  The patient states he fell onto something he thought was firm but was in fact soft and he fell to the ground.  He denies any pain or injury currently.  He had some right arm and shoulder pain earlier (4 out of 4) which have resolved.  He has some bandages applied to old wounds by home health.  He has no new wounds.   Past Medical History:  Diagnosis Date  . Acute metabolic encephalopathy [id number]  . Acute renal failure superimposed on stage 4 chronic kidney disease ([place]) 11/27/2015  . Anemia of chronic renal failure, stage 4 (severe) ([place]) 11/27/2015  . Anxiety   . Arthritis    back  . 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 CAD S/P percutaneous coronary angioplasty - 09/2013   a) 09/2013 - Inf STEMI (Promus DES 3 x 28 DES mRCA); b 05/2014 - UnstAngina - 85% RCA ISR - scoring & Flowella balloon PTCA; c) Anterolat STEMI - PTCA only of 99% D1 (small), staged DES (RESOLUTE ONYX DES 3 MM 18 MM) PCI of 95% mRCA lesion after initial stent - Overlapping DES  . Calcific aortic stenosis 10/01/2019   Echo shows functionally bicuspid aortic valve with calcified mild to moderate AS-mean gradient 19 mm, peak 34 mmHg.  Marland Kitchen Cholelithiasis with acute cholecystitis with biliary obstruction 05/2019   Cholecystectomy and biliary stent placed with ERCP  . CKD (chronic kidney disease) stage 3, GFR 30-59 ml/min (HCC)   . COPD (chronic obstructive pulmonary disease) ([place])   . Dementia ([place]) 05/13/2019  . Depression   . Essential hypertension 06/30/2007   Qualifier: Diagnosis of  By: Cori Razor RN, Mikal Plane Eye  abnormalities    injections in eyes 12/2017  . H/O Inferior STEMI (09/2013) 10/05/2013   emergent PCI with Promus DES to RCA (3.0 mm  x 28 mm - 3.2 mm)  . History of Anterolateral STEMI 10/01/2019   D1 99% thrombotic lesion-PTCA only.  Had staged PCI of m RCA 95% lesion after prior stent.  . Hypothyroidism   . Klebsiella sepsis ([place]) 04/02/2017  . Left-sided carotid artery disease -- s/p CEA 04/06/2006   S/p Left CEA     Past Surgical History:  Procedure Laterality Date  . AMPUTATION     left index finger -tramatic  . BILIARY STENT PLACEMENT N/A 01/06/2019   Procedure: BILIARY STENT PLACEMENT;  Surgeon: Clarene Essex, MD;  Location: [facility];  Service: Endoscopy;  Laterality: N/A;  . CAROTID ENDARTERECTOMY Left 2007   Dr.Hayes: 08/2016 Dopplers: Stable less than 40% right internal carotid stenosis and stable moderate 40-60% left internal carotid stenosis. Patent vertebral and subclavian arteries.  . CARPAL TUNNEL RELEASE    . CHOLECYSTECTOMY N/A 01/02/2019   Procedure: LAPAROSCOPIC CHOLECYSTECTOMY;  Surgeon: Michael Boston, MD;  Location: [facility];  Service: General;  Laterality: N/A;  . CORNEAL TRANSPLANT    . CORONARY ANGIOPLASTY WITH STENT PLACEMENT  09/2013; 05/2014    d) Promus DES 3.0 mm x 28 mm (3.2 mm)  to RCA with STEMI; residual 70% mid-distal LAD, OM 270-80%.  Medical management; b) PTCA of 85 % mRCA ISR (3.25 mm scoring balloon, 3.5 mm [place] balloon), LAD lesion noted as ~40%  . CORONARY STENT INTERVENTION N/A 10/08/2019   Procedure: CORONARY STENT INTERVENTION;  Surgeon: Wellington Hampshire, MD;  Location: [facility]; ; 95% mRCA after previous stent--overlapping DES PCI: Resolute Onyx 3.0 mm 18 mm  . CORONARY/GRAFT ACUTE MI REVASCULARIZATION N/A 10/01/2019   Procedure: Coronary/Graft Acute MI Revascularization;  Surgeon: Burnell Blanks, MD;  Location: [facility]; ANTEROLATERAL STEMI: PTCA only of 99% subtotal occluded D1;  . ERCP N/A 01/06/2019   Procedure:  ENDOSCOPIC RETROGRADE CHOLANGIOPANCREATOGRAPHY (ERCP);  Surgeon: Clarene Essex, MD;  Location: [facility];  Service: Endoscopy;  Laterality: N/A;  . ERCP N/A 05/28/2019   Procedure: ENDOSCOPIC RETROGRADE CHOLANGIOPANCREATOGRAPHY (ERCP);  Surgeon: Clarene Essex, MD;  Location: [facility];  Service: Endoscopy;  Laterality: N/A;  . FOOT SURGERY     left  . HERNIA REPAIR    . LEFT HEART CATH AND CORONARY ANGIOGRAPHY N/A 10/01/2019   Procedure: LEFT HEART CATH AND CORONARY ANGIOGRAPHY;  Surgeon: Burnell Blanks, MD;  Location: [facility];    . LEFT HEART CATH AND CORONARY ANGIOGRAPHY  11/11/2010   Procedure: LEFT HEART CATH AND CORONARY ANGIOGRAPHY;  Surgeon: Leonie Man, MD;  Location: [facility]; 40% mid LAD, 50% proximal RCA, 30-40% distal RCA (DOMINANT RCA with wraparound PDA & 2 large PLBs), 60-70% ostial OM1. No change from 2001. Medical therapy  . LEFT HEART CATHETERIZATION WITH CORONARY ANGIOGRAM N/A 10/05/2013   Procedure: LEFT HEART CATHETERIZATION WITH CORONARY ANGIOGRAM;  Surgeon: Peter M Martinique, MD;  Location: [facility] CATH LAB;  Service: Cardiovascular; INFERIOR STEMI: RCA 100% - PCI,CxOM stable, LAD ~70% mid;  . LEFT HEART CATHETERIZATION WITH CORONARY ANGIOGRAM N/A 06/08/2014   Procedure: LEFT HEART CATHETERIZATION WITH CORONARY ANGIOGRAM;  Surgeon: Troy Sine, MD;  Location: [facility] CATH LAB; UA 6/'15: 75% ISR in RCA - PTCA, LAD lesion ~40%, CxOM stable.    Marland Kitchen NM MYOVIEW LTD  03/2017    - Elevatred Troponin in setting of Sepsis -- DEMAND ISCHEMIA.  Normal perfusion. LVEF 51% with normal wall motion. This is a low risk study.  Marland Kitchen PARTIAL GASTRECTOMY    . PERCUTANEOUS CORONARY INTERVENTION-BALLOON ONLY  06/08/2014   Procedure: PERCUTANEOUS CORONARY INTERVENTION-BALLOON ONLY;  Surgeon: Troy Sine, MD;  Location[facility] CATH LAB;  Service: Cardiovascular;;  . REMOVAL OF STONES  05/28/2019   Procedure: REMOVAL OF STONES;  Surgeon: Clarene Essex, MD;  Location: [facility];   Service: Endoscopy;;  . ROTATOR CUFF REPAIR     left  . SHOULDER ARTHROSCOPY WITH ROTATOR CUFF REPAIR AND SUBACROMIAL DECOMPRESSION Right 07/01/2013   Procedure: RIGHT SHOULDER ARTHROSCOPY WITH  SUBACROMIAL DECOMPRESSION/DISTAL CLAVICLE RESECTION/ROTATOR CUFF REPAIR ;  Surgeon: Marin Shutter, MD;  Location: [facility];  Service: Orthopedics;  Laterality: Right;  . SPHINCTEROTOMY  01/06/2019   Procedure: SPHINCTEROTOMY;  Surgeon: Clarene Essex, MD;  Location: [facility];  Service: Endoscopy;;  . SPINE SURGERY  1985   cervical laminectomy  . STENT REMOVAL  05/28/2019   Procedure: STENT REMOVAL;  Surgeon: Clarene Essex, MD;  Location: [facility];  Service: Endoscopy;;  . TOTAL KNEE ARTHROPLASTY    . TRANSTHORACIC ECHOCARDIOGRAM  09/'17; 4/'18; 1/'20   a) Normal EF 55-60%. Mild to moderate AS with mod AI. Mild MR.;; b) Mild global reduction in LV systolic function: [id number]; grade 1 DD - high LVEDP. Mild AS/AI/TR. Mild-Mod MR; moderately  elevated PAP.;; c) TTE 12/2018: EF 55-60%. No RWMA. Gr1 DD. Mod AS with mild AI. Severe LA dilation.  . TRANSTHORACIC ECHOCARDIOGRAM  10/01/2019   ANTEROLATERAL STEMI: EF 45 to 50%.  GR 1 DD.  Normal atrial sizes.  Mild mitral stenosis..  Functionally bicuspid aortic valve with moderate AS (mean gradient 19 mmHg, peak 34 mmHg).  Marland Kitchen VAGOTOMY     partial gastrectomy    Family History  Problem Relation Age of Onset  . Heart disease Sister        Heart Transplant   . Diabetes Sister     Social History   Tobacco Use  . Smoking status: Former Smoker    Years: 15.00    Types: Cigarettes    Quit date: 12/23/1964    Years since quitting: 56.3  . Smokeless tobacco: Never Used  Vaping Use  . Vaping Use: Never used  Substance Use Topics  . Alcohol use: No  . Drug use: No    Prior to Admission medications   Medication Sig Start Date End Date Taking? Authorizing Provider  clopidogrel (PLAVIX) 75 MG tablet Take 1 tablet (75 mg total) by mouth daily. Patient taking  differently: Take 75 mg by mouth 2 (two) times a week. No specific days 02/23/20   Leonie Man, MD  latanoprost (XALATAN) 0.005 % ophthalmic solution Place 1 drop into both eyes at bedtime.     [provider]  hydrALAZINE (APRESOLINE) 25 MG tablet Take 25 mg tablet if blood pressure is equal or greater than 170 ( top number) up to 3 three times a day if needed Patient not taking: Reported on 10/31/2020 08/29/20 04/03/21  Leonie Man, MD    Allergies Amlodipine besylate, Lipitor [atorvastatin], Naproxen, Oxycodone-acetaminophen, and Isosorbide   REVIEW OF SYSTEMS  Negative except as noted here or in the History of Present Illness.   PHYSICAL EXAMINATION  Initial Vital Signs Blood pressure (!) 164/78, pulse 72, temperature 97.8 F (36.6 C), temperature source Oral, resp. rate 16, height '5\' 7"'$  (1.702 m), weight 40 kg, SpO2 95 %.  Examination General: Well-developed, thin male in no acute distress; appearance consistent with age of record HENT: normocephalic; atraumatic; small cutaneous horn of right cheek:    Eyes: pupils equal, round and reactive to light; extraocular muscles intact Neck: supple; nontender Heart: regular rate and rhythm Lungs: clear to auscultation bilaterally Abdomen: soft; nondistended; nontender; bowel sounds present Extremities: Arthritic changes; surgical absence of left index finger and tip of left thumb; no pain on passive range of motion Neurologic: Awake, alert and oriented x 2; motor function intact in all extremities and symmetric; no facial droop Skin: Warm and dry Psychiatric: Normal mood and affect   RESULTS  Summary of this visit's results, reviewed and interpreted by myself:   EKG Interpretation  Date/Time:    Ventricular Rate:    PR Interval:    QRS Duration:   QT Interval:    QTC Calculation:   R Axis:     Text Interpretation:        Laboratory Studies: No results found for this or any previous visit (from the past 24  hour(s)). Imaging Studies: No results found.  ED COURSE and MDM  Nursing notes, initial and subsequent vitals signs, including pulse oximetry, reviewed and interpreted by myself.  Vitals:   04/03/21 0039 04/03/21 0044  BP:  (!) 164/78  Pulse:  72  Resp:  16  Temp:  97.8 F (36.6 C)  TempSrc:  Oral  SpO2:  95%  Weight: 40 kg   Height: '5\' 7"'$  (1.702 m)    Medications - No data to display  I see no evidence of significant injury.  The patient is nontender and moves all extremities without difficulty.  I do not believe any radiographs or further work-up are indicated.  PROCEDURES  Procedures   ED DIAGNOSES     ICD-10-CM   1. Fall in home, initial encounter  W19.XXXA    Y92.009   2. Cutaneous horn  L85.8        Cora Stetson, MD 04/03/21 [phone]

## 2021-04-03 NOTE — Chronic Care Management (AMB) (Signed)
I spoke with the patient about his upcoming appointment on 04/04/2021 @ 2:00 pm  with the clinical pharmacist.  His appointment  Was cancelled . He is now in [facility].    Maia Breslow, [place] [phone]

## 2021-04-04 ENCOUNTER — Telehealth: Payer: Medicare Other

## 2021-04-04 DIAGNOSIS — R64 Cachexia: Secondary | ICD-10-CM | POA: Diagnosis not present

## 2021-04-04 DIAGNOSIS — E43 Unspecified severe protein-calorie malnutrition: Secondary | ICD-10-CM | POA: Diagnosis not present

## 2021-04-04 DIAGNOSIS — Z681 Body mass index (BMI) 19 or less, adult: Secondary | ICD-10-CM | POA: Diagnosis not present

## 2021-04-04 DIAGNOSIS — Z955 Presence of coronary angioplasty implant and graft: Secondary | ICD-10-CM | POA: Diagnosis not present

## 2021-04-04 DIAGNOSIS — J449 Chronic obstructive pulmonary disease, unspecified: Secondary | ICD-10-CM | POA: Diagnosis not present

## 2021-04-04 DIAGNOSIS — I252 Old myocardial infarction: Secondary | ICD-10-CM | POA: Diagnosis not present

## 2021-04-06 DIAGNOSIS — R64 Cachexia: Secondary | ICD-10-CM | POA: Diagnosis not present

## 2021-04-06 DIAGNOSIS — I252 Old myocardial infarction: Secondary | ICD-10-CM | POA: Diagnosis not present

## 2021-04-06 DIAGNOSIS — Z955 Presence of coronary angioplasty implant and graft: Secondary | ICD-10-CM | POA: Diagnosis not present

## 2021-04-06 DIAGNOSIS — J449 Chronic obstructive pulmonary disease, unspecified: Secondary | ICD-10-CM | POA: Diagnosis not present

## 2021-04-06 DIAGNOSIS — E43 Unspecified severe protein-calorie malnutrition: Secondary | ICD-10-CM | POA: Diagnosis not present

## 2021-04-06 DIAGNOSIS — Z681 Body mass index (BMI) 19 or less, adult: Secondary | ICD-10-CM | POA: Diagnosis not present

## 2021-04-10 DIAGNOSIS — I252 Old myocardial infarction: Secondary | ICD-10-CM | POA: Diagnosis not present

## 2021-04-10 DIAGNOSIS — J449 Chronic obstructive pulmonary disease, unspecified: Secondary | ICD-10-CM | POA: Diagnosis not present

## 2021-04-10 DIAGNOSIS — R64 Cachexia: Secondary | ICD-10-CM | POA: Diagnosis not present

## 2021-04-10 DIAGNOSIS — Z681 Body mass index (BMI) 19 or less, adult: Secondary | ICD-10-CM | POA: Diagnosis not present

## 2021-04-10 DIAGNOSIS — Z955 Presence of coronary angioplasty implant and graft: Secondary | ICD-10-CM | POA: Diagnosis not present

## 2021-04-10 DIAGNOSIS — E43 Unspecified severe protein-calorie malnutrition: Secondary | ICD-10-CM | POA: Diagnosis not present

## 2021-04-13 DIAGNOSIS — Z681 Body mass index (BMI) 19 or less, adult: Secondary | ICD-10-CM | POA: Diagnosis not present

## 2021-04-13 DIAGNOSIS — E43 Unspecified severe protein-calorie malnutrition: Secondary | ICD-10-CM | POA: Diagnosis not present

## 2021-04-13 DIAGNOSIS — R64 Cachexia: Secondary | ICD-10-CM | POA: Diagnosis not present

## 2021-04-13 DIAGNOSIS — I252 Old myocardial infarction: Secondary | ICD-10-CM | POA: Diagnosis not present

## 2021-04-13 DIAGNOSIS — Z955 Presence of coronary angioplasty implant and graft: Secondary | ICD-10-CM | POA: Diagnosis not present

## 2021-04-13 DIAGNOSIS — J449 Chronic obstructive pulmonary disease, unspecified: Secondary | ICD-10-CM | POA: Diagnosis not present

## 2021-04-17 DIAGNOSIS — R64 Cachexia: Secondary | ICD-10-CM | POA: Diagnosis not present

## 2021-04-17 DIAGNOSIS — Z955 Presence of coronary angioplasty implant and graft: Secondary | ICD-10-CM | POA: Diagnosis not present

## 2021-04-17 DIAGNOSIS — J449 Chronic obstructive pulmonary disease, unspecified: Secondary | ICD-10-CM | POA: Diagnosis not present

## 2021-04-17 DIAGNOSIS — I252 Old myocardial infarction: Secondary | ICD-10-CM | POA: Diagnosis not present

## 2021-04-17 DIAGNOSIS — E43 Unspecified severe protein-calorie malnutrition: Secondary | ICD-10-CM | POA: Diagnosis not present

## 2021-04-17 DIAGNOSIS — Z681 Body mass index (BMI) 19 or less, adult: Secondary | ICD-10-CM | POA: Diagnosis not present

## 2021-04-20 DIAGNOSIS — I252 Old myocardial infarction: Secondary | ICD-10-CM | POA: Diagnosis not present

## 2021-04-20 DIAGNOSIS — E43 Unspecified severe protein-calorie malnutrition: Secondary | ICD-10-CM | POA: Diagnosis not present

## 2021-04-20 DIAGNOSIS — J449 Chronic obstructive pulmonary disease, unspecified: Secondary | ICD-10-CM | POA: Diagnosis not present

## 2021-04-20 DIAGNOSIS — R64 Cachexia: Secondary | ICD-10-CM | POA: Diagnosis not present

## 2021-04-20 DIAGNOSIS — Z681 Body mass index (BMI) 19 or less, adult: Secondary | ICD-10-CM | POA: Diagnosis not present

## 2021-04-20 DIAGNOSIS — Z955 Presence of coronary angioplasty implant and graft: Secondary | ICD-10-CM | POA: Diagnosis not present

## 2021-04-22 DIAGNOSIS — R64 Cachexia: Secondary | ICD-10-CM | POA: Diagnosis not present

## 2021-04-22 DIAGNOSIS — K219 Gastro-esophageal reflux disease without esophagitis: Secondary | ICD-10-CM | POA: Diagnosis not present

## 2021-04-22 DIAGNOSIS — Z681 Body mass index (BMI) 19 or less, adult: Secondary | ICD-10-CM | POA: Diagnosis not present

## 2021-04-22 DIAGNOSIS — J449 Chronic obstructive pulmonary disease, unspecified: Secondary | ICD-10-CM | POA: Diagnosis not present

## 2021-04-22 DIAGNOSIS — K902 Blind loop syndrome, not elsewhere classified: Secondary | ICD-10-CM | POA: Diagnosis not present

## 2021-04-22 DIAGNOSIS — F0390 Unspecified dementia without behavioral disturbance: Secondary | ICD-10-CM | POA: Diagnosis not present

## 2021-04-22 DIAGNOSIS — I252 Old myocardial infarction: Secondary | ICD-10-CM | POA: Diagnosis not present

## 2021-04-22 DIAGNOSIS — N184 Chronic kidney disease, stage 4 (severe): Secondary | ICD-10-CM | POA: Diagnosis not present

## 2021-04-22 DIAGNOSIS — Z955 Presence of coronary angioplasty implant and graft: Secondary | ICD-10-CM | POA: Diagnosis not present

## 2021-04-22 DIAGNOSIS — E43 Unspecified severe protein-calorie malnutrition: Secondary | ICD-10-CM | POA: Diagnosis not present

## 2021-04-22 DIAGNOSIS — H409 Unspecified glaucoma: Secondary | ICD-10-CM | POA: Diagnosis not present

## 2021-04-22 DIAGNOSIS — N400 Enlarged prostate without lower urinary tract symptoms: Secondary | ICD-10-CM | POA: Diagnosis not present

## 2021-04-23 DIAGNOSIS — Z955 Presence of coronary angioplasty implant and graft: Secondary | ICD-10-CM | POA: Diagnosis not present

## 2021-04-23 DIAGNOSIS — R64 Cachexia: Secondary | ICD-10-CM | POA: Diagnosis not present

## 2021-04-23 DIAGNOSIS — J449 Chronic obstructive pulmonary disease, unspecified: Secondary | ICD-10-CM | POA: Diagnosis not present

## 2021-04-23 DIAGNOSIS — I252 Old myocardial infarction: Secondary | ICD-10-CM | POA: Diagnosis not present

## 2021-04-23 DIAGNOSIS — Z681 Body mass index (BMI) 19 or less, adult: Secondary | ICD-10-CM | POA: Diagnosis not present

## 2021-04-23 DIAGNOSIS — E43 Unspecified severe protein-calorie malnutrition: Secondary | ICD-10-CM | POA: Diagnosis not present

## 2021-04-24 DIAGNOSIS — I252 Old myocardial infarction: Secondary | ICD-10-CM | POA: Diagnosis not present

## 2021-04-24 DIAGNOSIS — Z955 Presence of coronary angioplasty implant and graft: Secondary | ICD-10-CM | POA: Diagnosis not present

## 2021-04-24 DIAGNOSIS — R64 Cachexia: Secondary | ICD-10-CM | POA: Diagnosis not present

## 2021-04-24 DIAGNOSIS — E43 Unspecified severe protein-calorie malnutrition: Secondary | ICD-10-CM | POA: Diagnosis not present

## 2021-04-24 DIAGNOSIS — Z681 Body mass index (BMI) 19 or less, adult: Secondary | ICD-10-CM | POA: Diagnosis not present

## 2021-04-24 DIAGNOSIS — J449 Chronic obstructive pulmonary disease, unspecified: Secondary | ICD-10-CM | POA: Diagnosis not present

## 2021-04-25 DIAGNOSIS — Z955 Presence of coronary angioplasty implant and graft: Secondary | ICD-10-CM | POA: Diagnosis not present

## 2021-04-25 DIAGNOSIS — E43 Unspecified severe protein-calorie malnutrition: Secondary | ICD-10-CM | POA: Diagnosis not present

## 2021-04-25 DIAGNOSIS — I252 Old myocardial infarction: Secondary | ICD-10-CM | POA: Diagnosis not present

## 2021-04-25 DIAGNOSIS — Z681 Body mass index (BMI) 19 or less, adult: Secondary | ICD-10-CM | POA: Diagnosis not present

## 2021-04-25 DIAGNOSIS — J449 Chronic obstructive pulmonary disease, unspecified: Secondary | ICD-10-CM | POA: Diagnosis not present

## 2021-04-25 DIAGNOSIS — R64 Cachexia: Secondary | ICD-10-CM | POA: Diagnosis not present

## 2021-04-27 DIAGNOSIS — Z681 Body mass index (BMI) 19 or less, adult: Secondary | ICD-10-CM | POA: Diagnosis not present

## 2021-04-27 DIAGNOSIS — Z955 Presence of coronary angioplasty implant and graft: Secondary | ICD-10-CM | POA: Diagnosis not present

## 2021-04-27 DIAGNOSIS — R64 Cachexia: Secondary | ICD-10-CM | POA: Diagnosis not present

## 2021-04-27 DIAGNOSIS — I252 Old myocardial infarction: Secondary | ICD-10-CM | POA: Diagnosis not present

## 2021-04-27 DIAGNOSIS — J449 Chronic obstructive pulmonary disease, unspecified: Secondary | ICD-10-CM | POA: Diagnosis not present

## 2021-04-27 DIAGNOSIS — E43 Unspecified severe protein-calorie malnutrition: Secondary | ICD-10-CM | POA: Diagnosis not present

## 2021-04-30 DIAGNOSIS — I252 Old myocardial infarction: Secondary | ICD-10-CM | POA: Diagnosis not present

## 2021-04-30 DIAGNOSIS — J449 Chronic obstructive pulmonary disease, unspecified: Secondary | ICD-10-CM | POA: Diagnosis not present

## 2021-04-30 DIAGNOSIS — Z955 Presence of coronary angioplasty implant and graft: Secondary | ICD-10-CM | POA: Diagnosis not present

## 2021-04-30 DIAGNOSIS — E43 Unspecified severe protein-calorie malnutrition: Secondary | ICD-10-CM | POA: Diagnosis not present

## 2021-04-30 DIAGNOSIS — R64 Cachexia: Secondary | ICD-10-CM | POA: Diagnosis not present

## 2021-04-30 DIAGNOSIS — Z681 Body mass index (BMI) 19 or less, adult: Secondary | ICD-10-CM | POA: Diagnosis not present

## 2021-05-02 DIAGNOSIS — R64 Cachexia: Secondary | ICD-10-CM | POA: Diagnosis not present

## 2021-05-02 DIAGNOSIS — I252 Old myocardial infarction: Secondary | ICD-10-CM | POA: Diagnosis not present

## 2021-05-02 DIAGNOSIS — Z955 Presence of coronary angioplasty implant and graft: Secondary | ICD-10-CM | POA: Diagnosis not present

## 2021-05-02 DIAGNOSIS — Z681 Body mass index (BMI) 19 or less, adult: Secondary | ICD-10-CM | POA: Diagnosis not present

## 2021-05-02 DIAGNOSIS — J449 Chronic obstructive pulmonary disease, unspecified: Secondary | ICD-10-CM | POA: Diagnosis not present

## 2021-05-02 DIAGNOSIS — E43 Unspecified severe protein-calorie malnutrition: Secondary | ICD-10-CM | POA: Diagnosis not present

## 2021-05-04 DIAGNOSIS — E43 Unspecified severe protein-calorie malnutrition: Secondary | ICD-10-CM | POA: Diagnosis not present

## 2021-05-04 DIAGNOSIS — R64 Cachexia: Secondary | ICD-10-CM | POA: Diagnosis not present

## 2021-05-04 DIAGNOSIS — Z681 Body mass index (BMI) 19 or less, adult: Secondary | ICD-10-CM | POA: Diagnosis not present

## 2021-05-04 DIAGNOSIS — I252 Old myocardial infarction: Secondary | ICD-10-CM | POA: Diagnosis not present

## 2021-05-04 DIAGNOSIS — Z955 Presence of coronary angioplasty implant and graft: Secondary | ICD-10-CM | POA: Diagnosis not present

## 2021-05-04 DIAGNOSIS — J449 Chronic obstructive pulmonary disease, unspecified: Secondary | ICD-10-CM | POA: Diagnosis not present

## 2021-05-07 DIAGNOSIS — I252 Old myocardial infarction: Secondary | ICD-10-CM | POA: Diagnosis not present

## 2021-05-07 DIAGNOSIS — Z955 Presence of coronary angioplasty implant and graft: Secondary | ICD-10-CM | POA: Diagnosis not present

## 2021-05-07 DIAGNOSIS — J449 Chronic obstructive pulmonary disease, unspecified: Secondary | ICD-10-CM | POA: Diagnosis not present

## 2021-05-07 DIAGNOSIS — R64 Cachexia: Secondary | ICD-10-CM | POA: Diagnosis not present

## 2021-05-07 DIAGNOSIS — E43 Unspecified severe protein-calorie malnutrition: Secondary | ICD-10-CM | POA: Diagnosis not present

## 2021-05-07 DIAGNOSIS — Z681 Body mass index (BMI) 19 or less, adult: Secondary | ICD-10-CM | POA: Diagnosis not present

## 2021-05-09 DIAGNOSIS — E43 Unspecified severe protein-calorie malnutrition: Secondary | ICD-10-CM | POA: Diagnosis not present

## 2021-05-09 DIAGNOSIS — R64 Cachexia: Secondary | ICD-10-CM | POA: Diagnosis not present

## 2021-05-09 DIAGNOSIS — Z955 Presence of coronary angioplasty implant and graft: Secondary | ICD-10-CM | POA: Diagnosis not present

## 2021-05-09 DIAGNOSIS — Z681 Body mass index (BMI) 19 or less, adult: Secondary | ICD-10-CM | POA: Diagnosis not present

## 2021-05-09 DIAGNOSIS — I252 Old myocardial infarction: Secondary | ICD-10-CM | POA: Diagnosis not present

## 2021-05-09 DIAGNOSIS — J449 Chronic obstructive pulmonary disease, unspecified: Secondary | ICD-10-CM | POA: Diagnosis not present

## 2021-05-11 DIAGNOSIS — Z955 Presence of coronary angioplasty implant and graft: Secondary | ICD-10-CM | POA: Diagnosis not present

## 2021-05-11 DIAGNOSIS — E43 Unspecified severe protein-calorie malnutrition: Secondary | ICD-10-CM | POA: Diagnosis not present

## 2021-05-11 DIAGNOSIS — J449 Chronic obstructive pulmonary disease, unspecified: Secondary | ICD-10-CM | POA: Diagnosis not present

## 2021-05-11 DIAGNOSIS — R64 Cachexia: Secondary | ICD-10-CM | POA: Diagnosis not present

## 2021-05-11 DIAGNOSIS — I252 Old myocardial infarction: Secondary | ICD-10-CM | POA: Diagnosis not present

## 2021-05-11 DIAGNOSIS — Z681 Body mass index (BMI) 19 or less, adult: Secondary | ICD-10-CM | POA: Diagnosis not present

## 2021-05-14 DIAGNOSIS — Z955 Presence of coronary angioplasty implant and graft: Secondary | ICD-10-CM | POA: Diagnosis not present

## 2021-05-14 DIAGNOSIS — J449 Chronic obstructive pulmonary disease, unspecified: Secondary | ICD-10-CM | POA: Diagnosis not present

## 2021-05-14 DIAGNOSIS — I252 Old myocardial infarction: Secondary | ICD-10-CM | POA: Diagnosis not present

## 2021-05-14 DIAGNOSIS — R64 Cachexia: Secondary | ICD-10-CM | POA: Diagnosis not present

## 2021-05-14 DIAGNOSIS — E43 Unspecified severe protein-calorie malnutrition: Secondary | ICD-10-CM | POA: Diagnosis not present

## 2021-05-14 DIAGNOSIS — Z681 Body mass index (BMI) 19 or less, adult: Secondary | ICD-10-CM | POA: Diagnosis not present

## 2021-05-16 DIAGNOSIS — R64 Cachexia: Secondary | ICD-10-CM | POA: Diagnosis not present

## 2021-05-16 DIAGNOSIS — E43 Unspecified severe protein-calorie malnutrition: Secondary | ICD-10-CM | POA: Diagnosis not present

## 2021-05-16 DIAGNOSIS — Z955 Presence of coronary angioplasty implant and graft: Secondary | ICD-10-CM | POA: Diagnosis not present

## 2021-05-16 DIAGNOSIS — I252 Old myocardial infarction: Secondary | ICD-10-CM | POA: Diagnosis not present

## 2021-05-16 DIAGNOSIS — J449 Chronic obstructive pulmonary disease, unspecified: Secondary | ICD-10-CM | POA: Diagnosis not present

## 2021-05-16 DIAGNOSIS — Z681 Body mass index (BMI) 19 or less, adult: Secondary | ICD-10-CM | POA: Diagnosis not present

## 2021-05-17 DIAGNOSIS — Z681 Body mass index (BMI) 19 or less, adult: Secondary | ICD-10-CM | POA: Diagnosis not present

## 2021-05-17 DIAGNOSIS — R64 Cachexia: Secondary | ICD-10-CM | POA: Diagnosis not present

## 2021-05-17 DIAGNOSIS — E43 Unspecified severe protein-calorie malnutrition: Secondary | ICD-10-CM | POA: Diagnosis not present

## 2021-05-17 DIAGNOSIS — J449 Chronic obstructive pulmonary disease, unspecified: Secondary | ICD-10-CM | POA: Diagnosis not present

## 2021-05-17 DIAGNOSIS — Z955 Presence of coronary angioplasty implant and graft: Secondary | ICD-10-CM | POA: Diagnosis not present

## 2021-05-17 DIAGNOSIS — I252 Old myocardial infarction: Secondary | ICD-10-CM | POA: Diagnosis not present

## 2021-05-18 DIAGNOSIS — Z955 Presence of coronary angioplasty implant and graft: Secondary | ICD-10-CM | POA: Diagnosis not present

## 2021-05-18 DIAGNOSIS — J449 Chronic obstructive pulmonary disease, unspecified: Secondary | ICD-10-CM | POA: Diagnosis not present

## 2021-05-18 DIAGNOSIS — R64 Cachexia: Secondary | ICD-10-CM | POA: Diagnosis not present

## 2021-05-18 DIAGNOSIS — Z681 Body mass index (BMI) 19 or less, adult: Secondary | ICD-10-CM | POA: Diagnosis not present

## 2021-05-18 DIAGNOSIS — E43 Unspecified severe protein-calorie malnutrition: Secondary | ICD-10-CM | POA: Diagnosis not present

## 2021-05-18 DIAGNOSIS — I252 Old myocardial infarction: Secondary | ICD-10-CM | POA: Diagnosis not present

## 2021-05-21 DIAGNOSIS — R64 Cachexia: Secondary | ICD-10-CM | POA: Diagnosis not present

## 2021-05-21 DIAGNOSIS — I252 Old myocardial infarction: Secondary | ICD-10-CM | POA: Diagnosis not present

## 2021-05-21 DIAGNOSIS — Z955 Presence of coronary angioplasty implant and graft: Secondary | ICD-10-CM | POA: Diagnosis not present

## 2021-05-21 DIAGNOSIS — E43 Unspecified severe protein-calorie malnutrition: Secondary | ICD-10-CM | POA: Diagnosis not present

## 2021-05-21 DIAGNOSIS — J449 Chronic obstructive pulmonary disease, unspecified: Secondary | ICD-10-CM | POA: Diagnosis not present

## 2021-05-21 DIAGNOSIS — Z681 Body mass index (BMI) 19 or less, adult: Secondary | ICD-10-CM | POA: Diagnosis not present

## 2021-05-23 DIAGNOSIS — K219 Gastro-esophageal reflux disease without esophagitis: Secondary | ICD-10-CM | POA: Diagnosis not present

## 2021-05-23 DIAGNOSIS — E43 Unspecified severe protein-calorie malnutrition: Secondary | ICD-10-CM | POA: Diagnosis not present

## 2021-05-23 DIAGNOSIS — I252 Old myocardial infarction: Secondary | ICD-10-CM | POA: Diagnosis not present

## 2021-05-23 DIAGNOSIS — Z955 Presence of coronary angioplasty implant and graft: Secondary | ICD-10-CM | POA: Diagnosis not present

## 2021-05-23 DIAGNOSIS — Z681 Body mass index (BMI) 19 or less, adult: Secondary | ICD-10-CM | POA: Diagnosis not present

## 2021-05-23 DIAGNOSIS — J449 Chronic obstructive pulmonary disease, unspecified: Secondary | ICD-10-CM | POA: Diagnosis not present

## 2021-05-23 DIAGNOSIS — N400 Enlarged prostate without lower urinary tract symptoms: Secondary | ICD-10-CM | POA: Diagnosis not present

## 2021-05-23 DIAGNOSIS — H409 Unspecified glaucoma: Secondary | ICD-10-CM | POA: Diagnosis not present

## 2021-05-23 DIAGNOSIS — N184 Chronic kidney disease, stage 4 (severe): Secondary | ICD-10-CM | POA: Diagnosis not present

## 2021-05-23 DIAGNOSIS — F0390 Unspecified dementia without behavioral disturbance: Secondary | ICD-10-CM | POA: Diagnosis not present

## 2021-05-23 DIAGNOSIS — K902 Blind loop syndrome, not elsewhere classified: Secondary | ICD-10-CM | POA: Diagnosis not present

## 2021-05-23 DIAGNOSIS — R64 Cachexia: Secondary | ICD-10-CM | POA: Diagnosis not present

## 2021-05-25 DIAGNOSIS — J449 Chronic obstructive pulmonary disease, unspecified: Secondary | ICD-10-CM | POA: Diagnosis not present

## 2021-05-25 DIAGNOSIS — I252 Old myocardial infarction: Secondary | ICD-10-CM | POA: Diagnosis not present

## 2021-05-25 DIAGNOSIS — Z681 Body mass index (BMI) 19 or less, adult: Secondary | ICD-10-CM | POA: Diagnosis not present

## 2021-05-25 DIAGNOSIS — R64 Cachexia: Secondary | ICD-10-CM | POA: Diagnosis not present

## 2021-05-25 DIAGNOSIS — Z955 Presence of coronary angioplasty implant and graft: Secondary | ICD-10-CM | POA: Diagnosis not present

## 2021-05-25 DIAGNOSIS — E43 Unspecified severe protein-calorie malnutrition: Secondary | ICD-10-CM | POA: Diagnosis not present

## 2021-05-28 DIAGNOSIS — E43 Unspecified severe protein-calorie malnutrition: Secondary | ICD-10-CM | POA: Diagnosis not present

## 2021-05-28 DIAGNOSIS — I252 Old myocardial infarction: Secondary | ICD-10-CM | POA: Diagnosis not present

## 2021-05-28 DIAGNOSIS — Z955 Presence of coronary angioplasty implant and graft: Secondary | ICD-10-CM | POA: Diagnosis not present

## 2021-05-28 DIAGNOSIS — J449 Chronic obstructive pulmonary disease, unspecified: Secondary | ICD-10-CM | POA: Diagnosis not present

## 2021-05-28 DIAGNOSIS — R64 Cachexia: Secondary | ICD-10-CM | POA: Diagnosis not present

## 2021-05-28 DIAGNOSIS — Z681 Body mass index (BMI) 19 or less, adult: Secondary | ICD-10-CM | POA: Diagnosis not present

## 2021-05-30 DIAGNOSIS — J449 Chronic obstructive pulmonary disease, unspecified: Secondary | ICD-10-CM | POA: Diagnosis not present

## 2021-05-30 DIAGNOSIS — Z955 Presence of coronary angioplasty implant and graft: Secondary | ICD-10-CM | POA: Diagnosis not present

## 2021-05-30 DIAGNOSIS — E43 Unspecified severe protein-calorie malnutrition: Secondary | ICD-10-CM | POA: Diagnosis not present

## 2021-05-30 DIAGNOSIS — R64 Cachexia: Secondary | ICD-10-CM | POA: Diagnosis not present

## 2021-05-30 DIAGNOSIS — Z681 Body mass index (BMI) 19 or less, adult: Secondary | ICD-10-CM | POA: Diagnosis not present

## 2021-05-30 DIAGNOSIS — I252 Old myocardial infarction: Secondary | ICD-10-CM | POA: Diagnosis not present

## 2021-06-01 DIAGNOSIS — I252 Old myocardial infarction: Secondary | ICD-10-CM | POA: Diagnosis not present

## 2021-06-01 DIAGNOSIS — E43 Unspecified severe protein-calorie malnutrition: Secondary | ICD-10-CM | POA: Diagnosis not present

## 2021-06-01 DIAGNOSIS — Z955 Presence of coronary angioplasty implant and graft: Secondary | ICD-10-CM | POA: Diagnosis not present

## 2021-06-01 DIAGNOSIS — J449 Chronic obstructive pulmonary disease, unspecified: Secondary | ICD-10-CM | POA: Diagnosis not present

## 2021-06-01 DIAGNOSIS — R64 Cachexia: Secondary | ICD-10-CM | POA: Diagnosis not present

## 2021-06-01 DIAGNOSIS — Z681 Body mass index (BMI) 19 or less, adult: Secondary | ICD-10-CM | POA: Diagnosis not present

## 2021-06-04 DIAGNOSIS — I252 Old myocardial infarction: Secondary | ICD-10-CM | POA: Diagnosis not present

## 2021-06-04 DIAGNOSIS — Z681 Body mass index (BMI) 19 or less, adult: Secondary | ICD-10-CM | POA: Diagnosis not present

## 2021-06-04 DIAGNOSIS — Z955 Presence of coronary angioplasty implant and graft: Secondary | ICD-10-CM | POA: Diagnosis not present

## 2021-06-04 DIAGNOSIS — E43 Unspecified severe protein-calorie malnutrition: Secondary | ICD-10-CM | POA: Diagnosis not present

## 2021-06-04 DIAGNOSIS — R64 Cachexia: Secondary | ICD-10-CM | POA: Diagnosis not present

## 2021-06-04 DIAGNOSIS — J449 Chronic obstructive pulmonary disease, unspecified: Secondary | ICD-10-CM | POA: Diagnosis not present

## 2021-06-05 DIAGNOSIS — J449 Chronic obstructive pulmonary disease, unspecified: Secondary | ICD-10-CM | POA: Diagnosis not present

## 2021-06-05 DIAGNOSIS — E43 Unspecified severe protein-calorie malnutrition: Secondary | ICD-10-CM | POA: Diagnosis not present

## 2021-06-05 DIAGNOSIS — R64 Cachexia: Secondary | ICD-10-CM | POA: Diagnosis not present

## 2021-06-05 DIAGNOSIS — Z681 Body mass index (BMI) 19 or less, adult: Secondary | ICD-10-CM | POA: Diagnosis not present

## 2021-06-05 DIAGNOSIS — I252 Old myocardial infarction: Secondary | ICD-10-CM | POA: Diagnosis not present

## 2021-06-05 DIAGNOSIS — Z955 Presence of coronary angioplasty implant and graft: Secondary | ICD-10-CM | POA: Diagnosis not present

## 2021-06-06 DIAGNOSIS — R64 Cachexia: Secondary | ICD-10-CM | POA: Diagnosis not present

## 2021-06-06 DIAGNOSIS — Z955 Presence of coronary angioplasty implant and graft: Secondary | ICD-10-CM | POA: Diagnosis not present

## 2021-06-06 DIAGNOSIS — Z681 Body mass index (BMI) 19 or less, adult: Secondary | ICD-10-CM | POA: Diagnosis not present

## 2021-06-06 DIAGNOSIS — J449 Chronic obstructive pulmonary disease, unspecified: Secondary | ICD-10-CM | POA: Diagnosis not present

## 2021-06-06 DIAGNOSIS — I252 Old myocardial infarction: Secondary | ICD-10-CM | POA: Diagnosis not present

## 2021-06-06 DIAGNOSIS — E43 Unspecified severe protein-calorie malnutrition: Secondary | ICD-10-CM | POA: Diagnosis not present

## 2021-06-08 DIAGNOSIS — Z955 Presence of coronary angioplasty implant and graft: Secondary | ICD-10-CM | POA: Diagnosis not present

## 2021-06-08 DIAGNOSIS — J449 Chronic obstructive pulmonary disease, unspecified: Secondary | ICD-10-CM | POA: Diagnosis not present

## 2021-06-08 DIAGNOSIS — Z681 Body mass index (BMI) 19 or less, adult: Secondary | ICD-10-CM | POA: Diagnosis not present

## 2021-06-08 DIAGNOSIS — I252 Old myocardial infarction: Secondary | ICD-10-CM | POA: Diagnosis not present

## 2021-06-08 DIAGNOSIS — E43 Unspecified severe protein-calorie malnutrition: Secondary | ICD-10-CM | POA: Diagnosis not present

## 2021-06-08 DIAGNOSIS — R64 Cachexia: Secondary | ICD-10-CM | POA: Diagnosis not present

## 2021-06-12 DIAGNOSIS — R64 Cachexia: Secondary | ICD-10-CM | POA: Diagnosis not present

## 2021-06-12 DIAGNOSIS — E43 Unspecified severe protein-calorie malnutrition: Secondary | ICD-10-CM | POA: Diagnosis not present

## 2021-06-12 DIAGNOSIS — Z955 Presence of coronary angioplasty implant and graft: Secondary | ICD-10-CM | POA: Diagnosis not present

## 2021-06-12 DIAGNOSIS — I252 Old myocardial infarction: Secondary | ICD-10-CM | POA: Diagnosis not present

## 2021-06-12 DIAGNOSIS — Z681 Body mass index (BMI) 19 or less, adult: Secondary | ICD-10-CM | POA: Diagnosis not present

## 2021-06-12 DIAGNOSIS — J449 Chronic obstructive pulmonary disease, unspecified: Secondary | ICD-10-CM | POA: Diagnosis not present

## 2021-06-13 DIAGNOSIS — Z955 Presence of coronary angioplasty implant and graft: Secondary | ICD-10-CM | POA: Diagnosis not present

## 2021-06-13 DIAGNOSIS — I252 Old myocardial infarction: Secondary | ICD-10-CM | POA: Diagnosis not present

## 2021-06-13 DIAGNOSIS — Z681 Body mass index (BMI) 19 or less, adult: Secondary | ICD-10-CM | POA: Diagnosis not present

## 2021-06-13 DIAGNOSIS — R64 Cachexia: Secondary | ICD-10-CM | POA: Diagnosis not present

## 2021-06-13 DIAGNOSIS — E43 Unspecified severe protein-calorie malnutrition: Secondary | ICD-10-CM | POA: Diagnosis not present

## 2021-06-13 DIAGNOSIS — J449 Chronic obstructive pulmonary disease, unspecified: Secondary | ICD-10-CM | POA: Diagnosis not present

## 2021-06-15 DIAGNOSIS — Z955 Presence of coronary angioplasty implant and graft: Secondary | ICD-10-CM | POA: Diagnosis not present

## 2021-06-15 DIAGNOSIS — E43 Unspecified severe protein-calorie malnutrition: Secondary | ICD-10-CM | POA: Diagnosis not present

## 2021-06-15 DIAGNOSIS — J449 Chronic obstructive pulmonary disease, unspecified: Secondary | ICD-10-CM | POA: Diagnosis not present

## 2021-06-15 DIAGNOSIS — Z681 Body mass index (BMI) 19 or less, adult: Secondary | ICD-10-CM | POA: Diagnosis not present

## 2021-06-15 DIAGNOSIS — I252 Old myocardial infarction: Secondary | ICD-10-CM | POA: Diagnosis not present

## 2021-06-15 DIAGNOSIS — R64 Cachexia: Secondary | ICD-10-CM | POA: Diagnosis not present

## 2021-06-18 DIAGNOSIS — I252 Old myocardial infarction: Secondary | ICD-10-CM | POA: Diagnosis not present

## 2021-06-18 DIAGNOSIS — E43 Unspecified severe protein-calorie malnutrition: Secondary | ICD-10-CM | POA: Diagnosis not present

## 2021-06-18 DIAGNOSIS — Z955 Presence of coronary angioplasty implant and graft: Secondary | ICD-10-CM | POA: Diagnosis not present

## 2021-06-18 DIAGNOSIS — R64 Cachexia: Secondary | ICD-10-CM | POA: Diagnosis not present

## 2021-06-18 DIAGNOSIS — J449 Chronic obstructive pulmonary disease, unspecified: Secondary | ICD-10-CM | POA: Diagnosis not present

## 2021-06-18 DIAGNOSIS — Z681 Body mass index (BMI) 19 or less, adult: Secondary | ICD-10-CM | POA: Diagnosis not present

## 2021-06-19 DIAGNOSIS — R64 Cachexia: Secondary | ICD-10-CM | POA: Diagnosis not present

## 2021-06-19 DIAGNOSIS — Z681 Body mass index (BMI) 19 or less, adult: Secondary | ICD-10-CM | POA: Diagnosis not present

## 2021-06-19 DIAGNOSIS — Z955 Presence of coronary angioplasty implant and graft: Secondary | ICD-10-CM | POA: Diagnosis not present

## 2021-06-19 DIAGNOSIS — I252 Old myocardial infarction: Secondary | ICD-10-CM | POA: Diagnosis not present

## 2021-06-19 DIAGNOSIS — E43 Unspecified severe protein-calorie malnutrition: Secondary | ICD-10-CM | POA: Diagnosis not present

## 2021-06-19 DIAGNOSIS — J449 Chronic obstructive pulmonary disease, unspecified: Secondary | ICD-10-CM | POA: Diagnosis not present

## 2021-06-20 DIAGNOSIS — R64 Cachexia: Secondary | ICD-10-CM | POA: Diagnosis not present

## 2021-06-20 DIAGNOSIS — J449 Chronic obstructive pulmonary disease, unspecified: Secondary | ICD-10-CM | POA: Diagnosis not present

## 2021-06-20 DIAGNOSIS — I252 Old myocardial infarction: Secondary | ICD-10-CM | POA: Diagnosis not present

## 2021-06-20 DIAGNOSIS — Z681 Body mass index (BMI) 19 or less, adult: Secondary | ICD-10-CM | POA: Diagnosis not present

## 2021-06-20 DIAGNOSIS — E43 Unspecified severe protein-calorie malnutrition: Secondary | ICD-10-CM | POA: Diagnosis not present

## 2021-06-20 DIAGNOSIS — Z955 Presence of coronary angioplasty implant and graft: Secondary | ICD-10-CM | POA: Diagnosis not present

## 2021-06-22 DIAGNOSIS — H409 Unspecified glaucoma: Secondary | ICD-10-CM | POA: Diagnosis not present

## 2021-06-22 DIAGNOSIS — R64 Cachexia: Secondary | ICD-10-CM | POA: Diagnosis not present

## 2021-06-22 DIAGNOSIS — Z681 Body mass index (BMI) 19 or less, adult: Secondary | ICD-10-CM | POA: Diagnosis not present

## 2021-06-22 DIAGNOSIS — E43 Unspecified severe protein-calorie malnutrition: Secondary | ICD-10-CM | POA: Diagnosis not present

## 2021-06-22 DIAGNOSIS — I252 Old myocardial infarction: Secondary | ICD-10-CM | POA: Diagnosis not present

## 2021-06-22 DIAGNOSIS — N400 Enlarged prostate without lower urinary tract symptoms: Secondary | ICD-10-CM | POA: Diagnosis not present

## 2021-06-22 DIAGNOSIS — N184 Chronic kidney disease, stage 4 (severe): Secondary | ICD-10-CM | POA: Diagnosis not present

## 2021-06-22 DIAGNOSIS — Z955 Presence of coronary angioplasty implant and graft: Secondary | ICD-10-CM | POA: Diagnosis not present

## 2021-06-22 DIAGNOSIS — K219 Gastro-esophageal reflux disease without esophagitis: Secondary | ICD-10-CM | POA: Diagnosis not present

## 2021-06-22 DIAGNOSIS — J449 Chronic obstructive pulmonary disease, unspecified: Secondary | ICD-10-CM | POA: Diagnosis not present

## 2021-06-22 DIAGNOSIS — K902 Blind loop syndrome, not elsewhere classified: Secondary | ICD-10-CM | POA: Diagnosis not present

## 2021-06-22 DIAGNOSIS — F0390 Unspecified dementia without behavioral disturbance: Secondary | ICD-10-CM | POA: Diagnosis not present

## 2021-06-27 DIAGNOSIS — Z955 Presence of coronary angioplasty implant and graft: Secondary | ICD-10-CM | POA: Diagnosis not present

## 2021-06-27 DIAGNOSIS — J449 Chronic obstructive pulmonary disease, unspecified: Secondary | ICD-10-CM | POA: Diagnosis not present

## 2021-06-27 DIAGNOSIS — E43 Unspecified severe protein-calorie malnutrition: Secondary | ICD-10-CM | POA: Diagnosis not present

## 2021-06-27 DIAGNOSIS — I252 Old myocardial infarction: Secondary | ICD-10-CM | POA: Diagnosis not present

## 2021-06-27 DIAGNOSIS — R64 Cachexia: Secondary | ICD-10-CM | POA: Diagnosis not present

## 2021-06-27 DIAGNOSIS — Z681 Body mass index (BMI) 19 or less, adult: Secondary | ICD-10-CM | POA: Diagnosis not present

## 2021-06-29 DIAGNOSIS — I252 Old myocardial infarction: Secondary | ICD-10-CM | POA: Diagnosis not present

## 2021-06-29 DIAGNOSIS — Z681 Body mass index (BMI) 19 or less, adult: Secondary | ICD-10-CM | POA: Diagnosis not present

## 2021-06-29 DIAGNOSIS — E43 Unspecified severe protein-calorie malnutrition: Secondary | ICD-10-CM | POA: Diagnosis not present

## 2021-06-29 DIAGNOSIS — Z955 Presence of coronary angioplasty implant and graft: Secondary | ICD-10-CM | POA: Diagnosis not present

## 2021-06-29 DIAGNOSIS — J449 Chronic obstructive pulmonary disease, unspecified: Secondary | ICD-10-CM | POA: Diagnosis not present

## 2021-06-29 DIAGNOSIS — R64 Cachexia: Secondary | ICD-10-CM | POA: Diagnosis not present

## 2021-07-02 DIAGNOSIS — Z955 Presence of coronary angioplasty implant and graft: Secondary | ICD-10-CM | POA: Diagnosis not present

## 2021-07-02 DIAGNOSIS — J449 Chronic obstructive pulmonary disease, unspecified: Secondary | ICD-10-CM | POA: Diagnosis not present

## 2021-07-02 DIAGNOSIS — I252 Old myocardial infarction: Secondary | ICD-10-CM | POA: Diagnosis not present

## 2021-07-02 DIAGNOSIS — Z681 Body mass index (BMI) 19 or less, adult: Secondary | ICD-10-CM | POA: Diagnosis not present

## 2021-07-02 DIAGNOSIS — E43 Unspecified severe protein-calorie malnutrition: Secondary | ICD-10-CM | POA: Diagnosis not present

## 2021-07-02 DIAGNOSIS — R64 Cachexia: Secondary | ICD-10-CM | POA: Diagnosis not present

## 2021-07-03 DIAGNOSIS — R64 Cachexia: Secondary | ICD-10-CM | POA: Diagnosis not present

## 2021-07-03 DIAGNOSIS — E43 Unspecified severe protein-calorie malnutrition: Secondary | ICD-10-CM | POA: Diagnosis not present

## 2021-07-03 DIAGNOSIS — J449 Chronic obstructive pulmonary disease, unspecified: Secondary | ICD-10-CM | POA: Diagnosis not present

## 2021-07-03 DIAGNOSIS — I252 Old myocardial infarction: Secondary | ICD-10-CM | POA: Diagnosis not present

## 2021-07-03 DIAGNOSIS — Z681 Body mass index (BMI) 19 or less, adult: Secondary | ICD-10-CM | POA: Diagnosis not present

## 2021-07-03 DIAGNOSIS — Z955 Presence of coronary angioplasty implant and graft: Secondary | ICD-10-CM | POA: Diagnosis not present

## 2021-07-04 DIAGNOSIS — Z955 Presence of coronary angioplasty implant and graft: Secondary | ICD-10-CM | POA: Diagnosis not present

## 2021-07-04 DIAGNOSIS — I252 Old myocardial infarction: Secondary | ICD-10-CM | POA: Diagnosis not present

## 2021-07-04 DIAGNOSIS — J449 Chronic obstructive pulmonary disease, unspecified: Secondary | ICD-10-CM | POA: Diagnosis not present

## 2021-07-04 DIAGNOSIS — R64 Cachexia: Secondary | ICD-10-CM | POA: Diagnosis not present

## 2021-07-04 DIAGNOSIS — Z681 Body mass index (BMI) 19 or less, adult: Secondary | ICD-10-CM | POA: Diagnosis not present

## 2021-07-04 DIAGNOSIS — E43 Unspecified severe protein-calorie malnutrition: Secondary | ICD-10-CM | POA: Diagnosis not present

## 2021-07-06 DIAGNOSIS — Z681 Body mass index (BMI) 19 or less, adult: Secondary | ICD-10-CM | POA: Diagnosis not present

## 2021-07-06 DIAGNOSIS — R64 Cachexia: Secondary | ICD-10-CM | POA: Diagnosis not present

## 2021-07-06 DIAGNOSIS — E43 Unspecified severe protein-calorie malnutrition: Secondary | ICD-10-CM | POA: Diagnosis not present

## 2021-07-06 DIAGNOSIS — J449 Chronic obstructive pulmonary disease, unspecified: Secondary | ICD-10-CM | POA: Diagnosis not present

## 2021-07-06 DIAGNOSIS — Z955 Presence of coronary angioplasty implant and graft: Secondary | ICD-10-CM | POA: Diagnosis not present

## 2021-07-06 DIAGNOSIS — I252 Old myocardial infarction: Secondary | ICD-10-CM | POA: Diagnosis not present

## 2021-07-09 DIAGNOSIS — Z681 Body mass index (BMI) 19 or less, adult: Secondary | ICD-10-CM | POA: Diagnosis not present

## 2021-07-09 DIAGNOSIS — E43 Unspecified severe protein-calorie malnutrition: Secondary | ICD-10-CM | POA: Diagnosis not present

## 2021-07-09 DIAGNOSIS — I252 Old myocardial infarction: Secondary | ICD-10-CM | POA: Diagnosis not present

## 2021-07-09 DIAGNOSIS — Z955 Presence of coronary angioplasty implant and graft: Secondary | ICD-10-CM | POA: Diagnosis not present

## 2021-07-09 DIAGNOSIS — J449 Chronic obstructive pulmonary disease, unspecified: Secondary | ICD-10-CM | POA: Diagnosis not present

## 2021-07-09 DIAGNOSIS — R64 Cachexia: Secondary | ICD-10-CM | POA: Diagnosis not present

## 2021-07-10 DIAGNOSIS — R64 Cachexia: Secondary | ICD-10-CM | POA: Diagnosis not present

## 2021-07-10 DIAGNOSIS — I252 Old myocardial infarction: Secondary | ICD-10-CM | POA: Diagnosis not present

## 2021-07-10 DIAGNOSIS — J449 Chronic obstructive pulmonary disease, unspecified: Secondary | ICD-10-CM | POA: Diagnosis not present

## 2021-07-10 DIAGNOSIS — E43 Unspecified severe protein-calorie malnutrition: Secondary | ICD-10-CM | POA: Diagnosis not present

## 2021-07-10 DIAGNOSIS — Z955 Presence of coronary angioplasty implant and graft: Secondary | ICD-10-CM | POA: Diagnosis not present

## 2021-07-10 DIAGNOSIS — Z681 Body mass index (BMI) 19 or less, adult: Secondary | ICD-10-CM | POA: Diagnosis not present

## 2021-07-11 DIAGNOSIS — E43 Unspecified severe protein-calorie malnutrition: Secondary | ICD-10-CM | POA: Diagnosis not present

## 2021-07-11 DIAGNOSIS — R64 Cachexia: Secondary | ICD-10-CM | POA: Diagnosis not present

## 2021-07-11 DIAGNOSIS — Z955 Presence of coronary angioplasty implant and graft: Secondary | ICD-10-CM | POA: Diagnosis not present

## 2021-07-11 DIAGNOSIS — J449 Chronic obstructive pulmonary disease, unspecified: Secondary | ICD-10-CM | POA: Diagnosis not present

## 2021-07-11 DIAGNOSIS — Z681 Body mass index (BMI) 19 or less, adult: Secondary | ICD-10-CM | POA: Diagnosis not present

## 2021-07-11 DIAGNOSIS — I252 Old myocardial infarction: Secondary | ICD-10-CM | POA: Diagnosis not present

## 2021-07-13 DIAGNOSIS — Z681 Body mass index (BMI) 19 or less, adult: Secondary | ICD-10-CM | POA: Diagnosis not present

## 2021-07-13 DIAGNOSIS — J449 Chronic obstructive pulmonary disease, unspecified: Secondary | ICD-10-CM | POA: Diagnosis not present

## 2021-07-13 DIAGNOSIS — E43 Unspecified severe protein-calorie malnutrition: Secondary | ICD-10-CM | POA: Diagnosis not present

## 2021-07-13 DIAGNOSIS — I252 Old myocardial infarction: Secondary | ICD-10-CM | POA: Diagnosis not present

## 2021-07-13 DIAGNOSIS — R64 Cachexia: Secondary | ICD-10-CM | POA: Diagnosis not present

## 2021-07-13 DIAGNOSIS — Z955 Presence of coronary angioplasty implant and graft: Secondary | ICD-10-CM | POA: Diagnosis not present

## 2021-07-15 DIAGNOSIS — Z955 Presence of coronary angioplasty implant and graft: Secondary | ICD-10-CM | POA: Diagnosis not present

## 2021-07-15 DIAGNOSIS — J449 Chronic obstructive pulmonary disease, unspecified: Secondary | ICD-10-CM | POA: Diagnosis not present

## 2021-07-15 DIAGNOSIS — E43 Unspecified severe protein-calorie malnutrition: Secondary | ICD-10-CM | POA: Diagnosis not present

## 2021-07-15 DIAGNOSIS — Z681 Body mass index (BMI) 19 or less, adult: Secondary | ICD-10-CM | POA: Diagnosis not present

## 2021-07-15 DIAGNOSIS — I252 Old myocardial infarction: Secondary | ICD-10-CM | POA: Diagnosis not present

## 2021-07-15 DIAGNOSIS — R64 Cachexia: Secondary | ICD-10-CM | POA: Diagnosis not present

## 2021-07-16 DIAGNOSIS — R64 Cachexia: Secondary | ICD-10-CM | POA: Diagnosis not present

## 2021-07-16 DIAGNOSIS — E43 Unspecified severe protein-calorie malnutrition: Secondary | ICD-10-CM | POA: Diagnosis not present

## 2021-07-16 DIAGNOSIS — Z681 Body mass index (BMI) 19 or less, adult: Secondary | ICD-10-CM | POA: Diagnosis not present

## 2021-07-16 DIAGNOSIS — Z955 Presence of coronary angioplasty implant and graft: Secondary | ICD-10-CM | POA: Diagnosis not present

## 2021-07-16 DIAGNOSIS — I252 Old myocardial infarction: Secondary | ICD-10-CM | POA: Diagnosis not present

## 2021-07-16 DIAGNOSIS — J449 Chronic obstructive pulmonary disease, unspecified: Secondary | ICD-10-CM | POA: Diagnosis not present

## 2021-07-17 DIAGNOSIS — E43 Unspecified severe protein-calorie malnutrition: Secondary | ICD-10-CM | POA: Diagnosis not present

## 2021-07-17 DIAGNOSIS — Z955 Presence of coronary angioplasty implant and graft: Secondary | ICD-10-CM | POA: Diagnosis not present

## 2021-07-17 DIAGNOSIS — R64 Cachexia: Secondary | ICD-10-CM | POA: Diagnosis not present

## 2021-07-17 DIAGNOSIS — J449 Chronic obstructive pulmonary disease, unspecified: Secondary | ICD-10-CM | POA: Diagnosis not present

## 2021-07-17 DIAGNOSIS — I252 Old myocardial infarction: Secondary | ICD-10-CM | POA: Diagnosis not present

## 2021-07-17 DIAGNOSIS — Z681 Body mass index (BMI) 19 or less, adult: Secondary | ICD-10-CM | POA: Diagnosis not present

## 2021-07-18 DIAGNOSIS — R64 Cachexia: Secondary | ICD-10-CM | POA: Diagnosis not present

## 2021-07-18 DIAGNOSIS — J449 Chronic obstructive pulmonary disease, unspecified: Secondary | ICD-10-CM | POA: Diagnosis not present

## 2021-07-18 DIAGNOSIS — I252 Old myocardial infarction: Secondary | ICD-10-CM | POA: Diagnosis not present

## 2021-07-18 DIAGNOSIS — Z955 Presence of coronary angioplasty implant and graft: Secondary | ICD-10-CM | POA: Diagnosis not present

## 2021-07-18 DIAGNOSIS — E43 Unspecified severe protein-calorie malnutrition: Secondary | ICD-10-CM | POA: Diagnosis not present

## 2021-07-18 DIAGNOSIS — Z681 Body mass index (BMI) 19 or less, adult: Secondary | ICD-10-CM | POA: Diagnosis not present

## 2021-07-20 DIAGNOSIS — I252 Old myocardial infarction: Secondary | ICD-10-CM | POA: Diagnosis not present

## 2021-07-20 DIAGNOSIS — Z681 Body mass index (BMI) 19 or less, adult: Secondary | ICD-10-CM | POA: Diagnosis not present

## 2021-07-20 DIAGNOSIS — Z955 Presence of coronary angioplasty implant and graft: Secondary | ICD-10-CM | POA: Diagnosis not present

## 2021-07-20 DIAGNOSIS — R64 Cachexia: Secondary | ICD-10-CM | POA: Diagnosis not present

## 2021-07-20 DIAGNOSIS — E43 Unspecified severe protein-calorie malnutrition: Secondary | ICD-10-CM | POA: Diagnosis not present

## 2021-07-20 DIAGNOSIS — J449 Chronic obstructive pulmonary disease, unspecified: Secondary | ICD-10-CM | POA: Diagnosis not present

## 2021-07-21 DIAGNOSIS — Z955 Presence of coronary angioplasty implant and graft: Secondary | ICD-10-CM | POA: Diagnosis not present

## 2021-07-21 DIAGNOSIS — R64 Cachexia: Secondary | ICD-10-CM | POA: Diagnosis not present

## 2021-07-21 DIAGNOSIS — I252 Old myocardial infarction: Secondary | ICD-10-CM | POA: Diagnosis not present

## 2021-07-21 DIAGNOSIS — J449 Chronic obstructive pulmonary disease, unspecified: Secondary | ICD-10-CM | POA: Diagnosis not present

## 2021-07-21 DIAGNOSIS — E43 Unspecified severe protein-calorie malnutrition: Secondary | ICD-10-CM | POA: Diagnosis not present

## 2021-07-21 DIAGNOSIS — Z681 Body mass index (BMI) 19 or less, adult: Secondary | ICD-10-CM | POA: Diagnosis not present

## 2021-07-23 DIAGNOSIS — N400 Enlarged prostate without lower urinary tract symptoms: Secondary | ICD-10-CM | POA: Diagnosis not present

## 2021-07-23 DIAGNOSIS — K902 Blind loop syndrome, not elsewhere classified: Secondary | ICD-10-CM | POA: Diagnosis not present

## 2021-07-23 DIAGNOSIS — I252 Old myocardial infarction: Secondary | ICD-10-CM | POA: Diagnosis not present

## 2021-07-23 DIAGNOSIS — R64 Cachexia: Secondary | ICD-10-CM | POA: Diagnosis not present

## 2021-07-23 DIAGNOSIS — Z955 Presence of coronary angioplasty implant and graft: Secondary | ICD-10-CM | POA: Diagnosis not present

## 2021-07-23 DIAGNOSIS — E43 Unspecified severe protein-calorie malnutrition: Secondary | ICD-10-CM | POA: Diagnosis not present

## 2021-07-23 DIAGNOSIS — F0390 Unspecified dementia without behavioral disturbance: Secondary | ICD-10-CM | POA: Diagnosis not present

## 2021-07-23 DIAGNOSIS — K219 Gastro-esophageal reflux disease without esophagitis: Secondary | ICD-10-CM | POA: Diagnosis not present

## 2021-07-23 DIAGNOSIS — H409 Unspecified glaucoma: Secondary | ICD-10-CM | POA: Diagnosis not present

## 2021-07-23 DIAGNOSIS — J449 Chronic obstructive pulmonary disease, unspecified: Secondary | ICD-10-CM | POA: Diagnosis not present

## 2021-07-23 DIAGNOSIS — Z681 Body mass index (BMI) 19 or less, adult: Secondary | ICD-10-CM | POA: Diagnosis not present

## 2021-07-23 DIAGNOSIS — N184 Chronic kidney disease, stage 4 (severe): Secondary | ICD-10-CM | POA: Diagnosis not present

## 2021-07-24 DIAGNOSIS — I252 Old myocardial infarction: Secondary | ICD-10-CM | POA: Diagnosis not present

## 2021-07-24 DIAGNOSIS — R64 Cachexia: Secondary | ICD-10-CM | POA: Diagnosis not present

## 2021-07-24 DIAGNOSIS — E43 Unspecified severe protein-calorie malnutrition: Secondary | ICD-10-CM | POA: Diagnosis not present

## 2021-07-24 DIAGNOSIS — Z681 Body mass index (BMI) 19 or less, adult: Secondary | ICD-10-CM | POA: Diagnosis not present

## 2021-07-24 DIAGNOSIS — Z955 Presence of coronary angioplasty implant and graft: Secondary | ICD-10-CM | POA: Diagnosis not present

## 2021-07-24 DIAGNOSIS — J449 Chronic obstructive pulmonary disease, unspecified: Secondary | ICD-10-CM | POA: Diagnosis not present

## 2021-07-25 DIAGNOSIS — I252 Old myocardial infarction: Secondary | ICD-10-CM | POA: Diagnosis not present

## 2021-07-25 DIAGNOSIS — Z955 Presence of coronary angioplasty implant and graft: Secondary | ICD-10-CM | POA: Diagnosis not present

## 2021-07-25 DIAGNOSIS — Z681 Body mass index (BMI) 19 or less, adult: Secondary | ICD-10-CM | POA: Diagnosis not present

## 2021-07-25 DIAGNOSIS — E43 Unspecified severe protein-calorie malnutrition: Secondary | ICD-10-CM | POA: Diagnosis not present

## 2021-07-25 DIAGNOSIS — J449 Chronic obstructive pulmonary disease, unspecified: Secondary | ICD-10-CM | POA: Diagnosis not present

## 2021-07-25 DIAGNOSIS — R64 Cachexia: Secondary | ICD-10-CM | POA: Diagnosis not present

## 2021-07-27 DIAGNOSIS — R64 Cachexia: Secondary | ICD-10-CM | POA: Diagnosis not present

## 2021-07-27 DIAGNOSIS — Z681 Body mass index (BMI) 19 or less, adult: Secondary | ICD-10-CM | POA: Diagnosis not present

## 2021-07-27 DIAGNOSIS — J449 Chronic obstructive pulmonary disease, unspecified: Secondary | ICD-10-CM | POA: Diagnosis not present

## 2021-07-27 DIAGNOSIS — E43 Unspecified severe protein-calorie malnutrition: Secondary | ICD-10-CM | POA: Diagnosis not present

## 2021-07-27 DIAGNOSIS — Z955 Presence of coronary angioplasty implant and graft: Secondary | ICD-10-CM | POA: Diagnosis not present

## 2021-07-27 DIAGNOSIS — I252 Old myocardial infarction: Secondary | ICD-10-CM | POA: Diagnosis not present

## 2021-07-30 DIAGNOSIS — E43 Unspecified severe protein-calorie malnutrition: Secondary | ICD-10-CM | POA: Diagnosis not present

## 2021-07-30 DIAGNOSIS — Z681 Body mass index (BMI) 19 or less, adult: Secondary | ICD-10-CM | POA: Diagnosis not present

## 2021-07-30 DIAGNOSIS — J449 Chronic obstructive pulmonary disease, unspecified: Secondary | ICD-10-CM | POA: Diagnosis not present

## 2021-07-30 DIAGNOSIS — I252 Old myocardial infarction: Secondary | ICD-10-CM | POA: Diagnosis not present

## 2021-07-30 DIAGNOSIS — Z955 Presence of coronary angioplasty implant and graft: Secondary | ICD-10-CM | POA: Diagnosis not present

## 2021-07-30 DIAGNOSIS — R64 Cachexia: Secondary | ICD-10-CM | POA: Diagnosis not present

## 2021-07-31 DIAGNOSIS — I252 Old myocardial infarction: Secondary | ICD-10-CM | POA: Diagnosis not present

## 2021-07-31 DIAGNOSIS — Z681 Body mass index (BMI) 19 or less, adult: Secondary | ICD-10-CM | POA: Diagnosis not present

## 2021-07-31 DIAGNOSIS — R64 Cachexia: Secondary | ICD-10-CM | POA: Diagnosis not present

## 2021-07-31 DIAGNOSIS — Z955 Presence of coronary angioplasty implant and graft: Secondary | ICD-10-CM | POA: Diagnosis not present

## 2021-07-31 DIAGNOSIS — E43 Unspecified severe protein-calorie malnutrition: Secondary | ICD-10-CM | POA: Diagnosis not present

## 2021-07-31 DIAGNOSIS — J449 Chronic obstructive pulmonary disease, unspecified: Secondary | ICD-10-CM | POA: Diagnosis not present

## 2021-08-01 DIAGNOSIS — I252 Old myocardial infarction: Secondary | ICD-10-CM | POA: Diagnosis not present

## 2021-08-01 DIAGNOSIS — Z955 Presence of coronary angioplasty implant and graft: Secondary | ICD-10-CM | POA: Diagnosis not present

## 2021-08-01 DIAGNOSIS — J449 Chronic obstructive pulmonary disease, unspecified: Secondary | ICD-10-CM | POA: Diagnosis not present

## 2021-08-01 DIAGNOSIS — Z681 Body mass index (BMI) 19 or less, adult: Secondary | ICD-10-CM | POA: Diagnosis not present

## 2021-08-01 DIAGNOSIS — E43 Unspecified severe protein-calorie malnutrition: Secondary | ICD-10-CM | POA: Diagnosis not present

## 2021-08-01 DIAGNOSIS — R64 Cachexia: Secondary | ICD-10-CM | POA: Diagnosis not present

## 2021-08-03 DIAGNOSIS — Z681 Body mass index (BMI) 19 or less, adult: Secondary | ICD-10-CM | POA: Diagnosis not present

## 2021-08-03 DIAGNOSIS — J449 Chronic obstructive pulmonary disease, unspecified: Secondary | ICD-10-CM | POA: Diagnosis not present

## 2021-08-03 DIAGNOSIS — Z955 Presence of coronary angioplasty implant and graft: Secondary | ICD-10-CM | POA: Diagnosis not present

## 2021-08-03 DIAGNOSIS — R64 Cachexia: Secondary | ICD-10-CM | POA: Diagnosis not present

## 2021-08-03 DIAGNOSIS — E43 Unspecified severe protein-calorie malnutrition: Secondary | ICD-10-CM | POA: Diagnosis not present

## 2021-08-03 DIAGNOSIS — I252 Old myocardial infarction: Secondary | ICD-10-CM | POA: Diagnosis not present

## 2021-08-06 DIAGNOSIS — J449 Chronic obstructive pulmonary disease, unspecified: Secondary | ICD-10-CM | POA: Diagnosis not present

## 2021-08-06 DIAGNOSIS — Z681 Body mass index (BMI) 19 or less, adult: Secondary | ICD-10-CM | POA: Diagnosis not present

## 2021-08-06 DIAGNOSIS — I252 Old myocardial infarction: Secondary | ICD-10-CM | POA: Diagnosis not present

## 2021-08-06 DIAGNOSIS — E43 Unspecified severe protein-calorie malnutrition: Secondary | ICD-10-CM | POA: Diagnosis not present

## 2021-08-06 DIAGNOSIS — R64 Cachexia: Secondary | ICD-10-CM | POA: Diagnosis not present

## 2021-08-06 DIAGNOSIS — Z955 Presence of coronary angioplasty implant and graft: Secondary | ICD-10-CM | POA: Diagnosis not present

## 2021-08-07 DIAGNOSIS — Z681 Body mass index (BMI) 19 or less, adult: Secondary | ICD-10-CM | POA: Diagnosis not present

## 2021-08-07 DIAGNOSIS — Z955 Presence of coronary angioplasty implant and graft: Secondary | ICD-10-CM | POA: Diagnosis not present

## 2021-08-07 DIAGNOSIS — E43 Unspecified severe protein-calorie malnutrition: Secondary | ICD-10-CM | POA: Diagnosis not present

## 2021-08-07 DIAGNOSIS — R64 Cachexia: Secondary | ICD-10-CM | POA: Diagnosis not present

## 2021-08-07 DIAGNOSIS — I252 Old myocardial infarction: Secondary | ICD-10-CM | POA: Diagnosis not present

## 2021-08-07 DIAGNOSIS — J449 Chronic obstructive pulmonary disease, unspecified: Secondary | ICD-10-CM | POA: Diagnosis not present

## 2021-08-08 DIAGNOSIS — R64 Cachexia: Secondary | ICD-10-CM | POA: Diagnosis not present

## 2021-08-08 DIAGNOSIS — Z955 Presence of coronary angioplasty implant and graft: Secondary | ICD-10-CM | POA: Diagnosis not present

## 2021-08-08 DIAGNOSIS — J449 Chronic obstructive pulmonary disease, unspecified: Secondary | ICD-10-CM | POA: Diagnosis not present

## 2021-08-08 DIAGNOSIS — E43 Unspecified severe protein-calorie malnutrition: Secondary | ICD-10-CM | POA: Diagnosis not present

## 2021-08-08 DIAGNOSIS — Z681 Body mass index (BMI) 19 or less, adult: Secondary | ICD-10-CM | POA: Diagnosis not present

## 2021-08-08 DIAGNOSIS — I252 Old myocardial infarction: Secondary | ICD-10-CM | POA: Diagnosis not present

## 2021-08-10 DIAGNOSIS — I252 Old myocardial infarction: Secondary | ICD-10-CM | POA: Diagnosis not present

## 2021-08-10 DIAGNOSIS — Z681 Body mass index (BMI) 19 or less, adult: Secondary | ICD-10-CM | POA: Diagnosis not present

## 2021-08-10 DIAGNOSIS — R64 Cachexia: Secondary | ICD-10-CM | POA: Diagnosis not present

## 2021-08-10 DIAGNOSIS — Z955 Presence of coronary angioplasty implant and graft: Secondary | ICD-10-CM | POA: Diagnosis not present

## 2021-08-10 DIAGNOSIS — J449 Chronic obstructive pulmonary disease, unspecified: Secondary | ICD-10-CM | POA: Diagnosis not present

## 2021-08-10 DIAGNOSIS — E43 Unspecified severe protein-calorie malnutrition: Secondary | ICD-10-CM | POA: Diagnosis not present

## 2021-08-13 DIAGNOSIS — Z681 Body mass index (BMI) 19 or less, adult: Secondary | ICD-10-CM | POA: Diagnosis not present

## 2021-08-13 DIAGNOSIS — I252 Old myocardial infarction: Secondary | ICD-10-CM | POA: Diagnosis not present

## 2021-08-13 DIAGNOSIS — R64 Cachexia: Secondary | ICD-10-CM | POA: Diagnosis not present

## 2021-08-13 DIAGNOSIS — E43 Unspecified severe protein-calorie malnutrition: Secondary | ICD-10-CM | POA: Diagnosis not present

## 2021-08-13 DIAGNOSIS — Z955 Presence of coronary angioplasty implant and graft: Secondary | ICD-10-CM | POA: Diagnosis not present

## 2021-08-13 DIAGNOSIS — J449 Chronic obstructive pulmonary disease, unspecified: Secondary | ICD-10-CM | POA: Diagnosis not present

## 2021-08-14 DIAGNOSIS — J449 Chronic obstructive pulmonary disease, unspecified: Secondary | ICD-10-CM | POA: Diagnosis not present

## 2021-08-14 DIAGNOSIS — R64 Cachexia: Secondary | ICD-10-CM | POA: Diagnosis not present

## 2021-08-14 DIAGNOSIS — Z681 Body mass index (BMI) 19 or less, adult: Secondary | ICD-10-CM | POA: Diagnosis not present

## 2021-08-14 DIAGNOSIS — Z955 Presence of coronary angioplasty implant and graft: Secondary | ICD-10-CM | POA: Diagnosis not present

## 2021-08-14 DIAGNOSIS — I252 Old myocardial infarction: Secondary | ICD-10-CM | POA: Diagnosis not present

## 2021-08-14 DIAGNOSIS — E43 Unspecified severe protein-calorie malnutrition: Secondary | ICD-10-CM | POA: Diagnosis not present

## 2021-08-15 DIAGNOSIS — E43 Unspecified severe protein-calorie malnutrition: Secondary | ICD-10-CM | POA: Diagnosis not present

## 2021-08-15 DIAGNOSIS — R64 Cachexia: Secondary | ICD-10-CM | POA: Diagnosis not present

## 2021-08-15 DIAGNOSIS — I252 Old myocardial infarction: Secondary | ICD-10-CM | POA: Diagnosis not present

## 2021-08-15 DIAGNOSIS — Z955 Presence of coronary angioplasty implant and graft: Secondary | ICD-10-CM | POA: Diagnosis not present

## 2021-08-15 DIAGNOSIS — J449 Chronic obstructive pulmonary disease, unspecified: Secondary | ICD-10-CM | POA: Diagnosis not present

## 2021-08-15 DIAGNOSIS — Z681 Body mass index (BMI) 19 or less, adult: Secondary | ICD-10-CM | POA: Diagnosis not present

## 2021-08-17 DIAGNOSIS — Z955 Presence of coronary angioplasty implant and graft: Secondary | ICD-10-CM | POA: Diagnosis not present

## 2021-08-17 DIAGNOSIS — E43 Unspecified severe protein-calorie malnutrition: Secondary | ICD-10-CM | POA: Diagnosis not present

## 2021-08-17 DIAGNOSIS — R64 Cachexia: Secondary | ICD-10-CM | POA: Diagnosis not present

## 2021-08-17 DIAGNOSIS — Z681 Body mass index (BMI) 19 or less, adult: Secondary | ICD-10-CM | POA: Diagnosis not present

## 2021-08-17 DIAGNOSIS — J449 Chronic obstructive pulmonary disease, unspecified: Secondary | ICD-10-CM | POA: Diagnosis not present

## 2021-08-17 DIAGNOSIS — I252 Old myocardial infarction: Secondary | ICD-10-CM | POA: Diagnosis not present

## 2021-08-18 DIAGNOSIS — I252 Old myocardial infarction: Secondary | ICD-10-CM | POA: Diagnosis not present

## 2021-08-18 DIAGNOSIS — Z955 Presence of coronary angioplasty implant and graft: Secondary | ICD-10-CM | POA: Diagnosis not present

## 2021-08-18 DIAGNOSIS — Z681 Body mass index (BMI) 19 or less, adult: Secondary | ICD-10-CM | POA: Diagnosis not present

## 2021-08-18 DIAGNOSIS — R64 Cachexia: Secondary | ICD-10-CM | POA: Diagnosis not present

## 2021-08-18 DIAGNOSIS — E43 Unspecified severe protein-calorie malnutrition: Secondary | ICD-10-CM | POA: Diagnosis not present

## 2021-08-18 DIAGNOSIS — J449 Chronic obstructive pulmonary disease, unspecified: Secondary | ICD-10-CM | POA: Diagnosis not present

## 2021-08-20 DIAGNOSIS — E43 Unspecified severe protein-calorie malnutrition: Secondary | ICD-10-CM | POA: Diagnosis not present

## 2021-08-20 DIAGNOSIS — Z681 Body mass index (BMI) 19 or less, adult: Secondary | ICD-10-CM | POA: Diagnosis not present

## 2021-08-20 DIAGNOSIS — Z955 Presence of coronary angioplasty implant and graft: Secondary | ICD-10-CM | POA: Diagnosis not present

## 2021-08-20 DIAGNOSIS — J449 Chronic obstructive pulmonary disease, unspecified: Secondary | ICD-10-CM | POA: Diagnosis not present

## 2021-08-20 DIAGNOSIS — I252 Old myocardial infarction: Secondary | ICD-10-CM | POA: Diagnosis not present

## 2021-08-20 DIAGNOSIS — R64 Cachexia: Secondary | ICD-10-CM | POA: Diagnosis not present

## 2021-08-23 DIAGNOSIS — K902 Blind loop syndrome, not elsewhere classified: Secondary | ICD-10-CM | POA: Diagnosis not present

## 2021-08-23 DIAGNOSIS — R64 Cachexia: Secondary | ICD-10-CM | POA: Diagnosis not present

## 2021-08-23 DIAGNOSIS — Z681 Body mass index (BMI) 19 or less, adult: Secondary | ICD-10-CM | POA: Diagnosis not present

## 2021-08-23 DIAGNOSIS — N400 Enlarged prostate without lower urinary tract symptoms: Secondary | ICD-10-CM | POA: Diagnosis not present

## 2021-08-23 DIAGNOSIS — N184 Chronic kidney disease, stage 4 (severe): Secondary | ICD-10-CM | POA: Diagnosis not present

## 2021-08-23 DIAGNOSIS — I252 Old myocardial infarction: Secondary | ICD-10-CM | POA: Diagnosis not present

## 2021-08-23 DIAGNOSIS — K219 Gastro-esophageal reflux disease without esophagitis: Secondary | ICD-10-CM | POA: Diagnosis not present

## 2021-08-23 DIAGNOSIS — E43 Unspecified severe protein-calorie malnutrition: Secondary | ICD-10-CM | POA: Diagnosis not present

## 2021-08-23 DIAGNOSIS — F0390 Unspecified dementia without behavioral disturbance: Secondary | ICD-10-CM | POA: Diagnosis not present

## 2021-08-23 DIAGNOSIS — J449 Chronic obstructive pulmonary disease, unspecified: Secondary | ICD-10-CM | POA: Diagnosis not present

## 2021-08-23 DIAGNOSIS — H409 Unspecified glaucoma: Secondary | ICD-10-CM | POA: Diagnosis not present

## 2021-08-23 DIAGNOSIS — Z955 Presence of coronary angioplasty implant and graft: Secondary | ICD-10-CM | POA: Diagnosis not present

## 2021-08-24 DIAGNOSIS — E43 Unspecified severe protein-calorie malnutrition: Secondary | ICD-10-CM | POA: Diagnosis not present

## 2021-08-24 DIAGNOSIS — I252 Old myocardial infarction: Secondary | ICD-10-CM | POA: Diagnosis not present

## 2021-08-24 DIAGNOSIS — J449 Chronic obstructive pulmonary disease, unspecified: Secondary | ICD-10-CM | POA: Diagnosis not present

## 2021-08-24 DIAGNOSIS — Z955 Presence of coronary angioplasty implant and graft: Secondary | ICD-10-CM | POA: Diagnosis not present

## 2021-08-24 DIAGNOSIS — Z681 Body mass index (BMI) 19 or less, adult: Secondary | ICD-10-CM | POA: Diagnosis not present

## 2021-08-24 DIAGNOSIS — R64 Cachexia: Secondary | ICD-10-CM | POA: Diagnosis not present

## 2021-08-28 DIAGNOSIS — I252 Old myocardial infarction: Secondary | ICD-10-CM | POA: Diagnosis not present

## 2021-08-28 DIAGNOSIS — E43 Unspecified severe protein-calorie malnutrition: Secondary | ICD-10-CM | POA: Diagnosis not present

## 2021-08-28 DIAGNOSIS — Z955 Presence of coronary angioplasty implant and graft: Secondary | ICD-10-CM | POA: Diagnosis not present

## 2021-08-28 DIAGNOSIS — R64 Cachexia: Secondary | ICD-10-CM | POA: Diagnosis not present

## 2021-08-28 DIAGNOSIS — Z681 Body mass index (BMI) 19 or less, adult: Secondary | ICD-10-CM | POA: Diagnosis not present

## 2021-08-28 DIAGNOSIS — J449 Chronic obstructive pulmonary disease, unspecified: Secondary | ICD-10-CM | POA: Diagnosis not present

## 2021-08-29 DIAGNOSIS — J449 Chronic obstructive pulmonary disease, unspecified: Secondary | ICD-10-CM | POA: Diagnosis not present

## 2021-08-29 DIAGNOSIS — R64 Cachexia: Secondary | ICD-10-CM | POA: Diagnosis not present

## 2021-08-29 DIAGNOSIS — E43 Unspecified severe protein-calorie malnutrition: Secondary | ICD-10-CM | POA: Diagnosis not present

## 2021-08-29 DIAGNOSIS — Z681 Body mass index (BMI) 19 or less, adult: Secondary | ICD-10-CM | POA: Diagnosis not present

## 2021-08-29 DIAGNOSIS — I252 Old myocardial infarction: Secondary | ICD-10-CM | POA: Diagnosis not present

## 2021-08-29 DIAGNOSIS — Z955 Presence of coronary angioplasty implant and graft: Secondary | ICD-10-CM | POA: Diagnosis not present

## 2021-08-31 DIAGNOSIS — I252 Old myocardial infarction: Secondary | ICD-10-CM | POA: Diagnosis not present

## 2021-08-31 DIAGNOSIS — Z681 Body mass index (BMI) 19 or less, adult: Secondary | ICD-10-CM | POA: Diagnosis not present

## 2021-08-31 DIAGNOSIS — Z955 Presence of coronary angioplasty implant and graft: Secondary | ICD-10-CM | POA: Diagnosis not present

## 2021-08-31 DIAGNOSIS — R64 Cachexia: Secondary | ICD-10-CM | POA: Diagnosis not present

## 2021-08-31 DIAGNOSIS — E43 Unspecified severe protein-calorie malnutrition: Secondary | ICD-10-CM | POA: Diagnosis not present

## 2021-08-31 DIAGNOSIS — J449 Chronic obstructive pulmonary disease, unspecified: Secondary | ICD-10-CM | POA: Diagnosis not present

## 2021-09-03 DIAGNOSIS — Z681 Body mass index (BMI) 19 or less, adult: Secondary | ICD-10-CM | POA: Diagnosis not present

## 2021-09-03 DIAGNOSIS — E43 Unspecified severe protein-calorie malnutrition: Secondary | ICD-10-CM | POA: Diagnosis not present

## 2021-09-03 DIAGNOSIS — J449 Chronic obstructive pulmonary disease, unspecified: Secondary | ICD-10-CM | POA: Diagnosis not present

## 2021-09-03 DIAGNOSIS — R64 Cachexia: Secondary | ICD-10-CM | POA: Diagnosis not present

## 2021-09-03 DIAGNOSIS — Z955 Presence of coronary angioplasty implant and graft: Secondary | ICD-10-CM | POA: Diagnosis not present

## 2021-09-03 DIAGNOSIS — I252 Old myocardial infarction: Secondary | ICD-10-CM | POA: Diagnosis not present

## 2021-09-04 DIAGNOSIS — Z955 Presence of coronary angioplasty implant and graft: Secondary | ICD-10-CM | POA: Diagnosis not present

## 2021-09-04 DIAGNOSIS — Z681 Body mass index (BMI) 19 or less, adult: Secondary | ICD-10-CM | POA: Diagnosis not present

## 2021-09-04 DIAGNOSIS — R64 Cachexia: Secondary | ICD-10-CM | POA: Diagnosis not present

## 2021-09-04 DIAGNOSIS — E43 Unspecified severe protein-calorie malnutrition: Secondary | ICD-10-CM | POA: Diagnosis not present

## 2021-09-04 DIAGNOSIS — I252 Old myocardial infarction: Secondary | ICD-10-CM | POA: Diagnosis not present

## 2021-09-04 DIAGNOSIS — J449 Chronic obstructive pulmonary disease, unspecified: Secondary | ICD-10-CM | POA: Diagnosis not present

## 2021-09-05 DIAGNOSIS — E43 Unspecified severe protein-calorie malnutrition: Secondary | ICD-10-CM | POA: Diagnosis not present

## 2021-09-05 DIAGNOSIS — J449 Chronic obstructive pulmonary disease, unspecified: Secondary | ICD-10-CM | POA: Diagnosis not present

## 2021-09-05 DIAGNOSIS — I252 Old myocardial infarction: Secondary | ICD-10-CM | POA: Diagnosis not present

## 2021-09-05 DIAGNOSIS — Z681 Body mass index (BMI) 19 or less, adult: Secondary | ICD-10-CM | POA: Diagnosis not present

## 2021-09-05 DIAGNOSIS — R64 Cachexia: Secondary | ICD-10-CM | POA: Diagnosis not present

## 2021-09-05 DIAGNOSIS — Z955 Presence of coronary angioplasty implant and graft: Secondary | ICD-10-CM | POA: Diagnosis not present

## 2021-09-07 DIAGNOSIS — J449 Chronic obstructive pulmonary disease, unspecified: Secondary | ICD-10-CM | POA: Diagnosis not present

## 2021-09-07 DIAGNOSIS — I252 Old myocardial infarction: Secondary | ICD-10-CM | POA: Diagnosis not present

## 2021-09-07 DIAGNOSIS — R64 Cachexia: Secondary | ICD-10-CM | POA: Diagnosis not present

## 2021-09-07 DIAGNOSIS — Z955 Presence of coronary angioplasty implant and graft: Secondary | ICD-10-CM | POA: Diagnosis not present

## 2021-09-07 DIAGNOSIS — Z681 Body mass index (BMI) 19 or less, adult: Secondary | ICD-10-CM | POA: Diagnosis not present

## 2021-09-07 DIAGNOSIS — E43 Unspecified severe protein-calorie malnutrition: Secondary | ICD-10-CM | POA: Diagnosis not present

## 2021-09-10 DIAGNOSIS — I252 Old myocardial infarction: Secondary | ICD-10-CM | POA: Diagnosis not present

## 2021-09-10 DIAGNOSIS — Z681 Body mass index (BMI) 19 or less, adult: Secondary | ICD-10-CM | POA: Diagnosis not present

## 2021-09-10 DIAGNOSIS — E43 Unspecified severe protein-calorie malnutrition: Secondary | ICD-10-CM | POA: Diagnosis not present

## 2021-09-10 DIAGNOSIS — J449 Chronic obstructive pulmonary disease, unspecified: Secondary | ICD-10-CM | POA: Diagnosis not present

## 2021-09-10 DIAGNOSIS — Z955 Presence of coronary angioplasty implant and graft: Secondary | ICD-10-CM | POA: Diagnosis not present

## 2021-09-10 DIAGNOSIS — R64 Cachexia: Secondary | ICD-10-CM | POA: Diagnosis not present

## 2021-09-12 DIAGNOSIS — R64 Cachexia: Secondary | ICD-10-CM | POA: Diagnosis not present

## 2021-09-12 DIAGNOSIS — Z955 Presence of coronary angioplasty implant and graft: Secondary | ICD-10-CM | POA: Diagnosis not present

## 2021-09-12 DIAGNOSIS — J449 Chronic obstructive pulmonary disease, unspecified: Secondary | ICD-10-CM | POA: Diagnosis not present

## 2021-09-12 DIAGNOSIS — E43 Unspecified severe protein-calorie malnutrition: Secondary | ICD-10-CM | POA: Diagnosis not present

## 2021-09-12 DIAGNOSIS — Z681 Body mass index (BMI) 19 or less, adult: Secondary | ICD-10-CM | POA: Diagnosis not present

## 2021-09-12 DIAGNOSIS — I252 Old myocardial infarction: Secondary | ICD-10-CM | POA: Diagnosis not present

## 2021-09-14 DIAGNOSIS — I252 Old myocardial infarction: Secondary | ICD-10-CM | POA: Diagnosis not present

## 2021-09-14 DIAGNOSIS — Z955 Presence of coronary angioplasty implant and graft: Secondary | ICD-10-CM | POA: Diagnosis not present

## 2021-09-14 DIAGNOSIS — J449 Chronic obstructive pulmonary disease, unspecified: Secondary | ICD-10-CM | POA: Diagnosis not present

## 2021-09-14 DIAGNOSIS — Z681 Body mass index (BMI) 19 or less, adult: Secondary | ICD-10-CM | POA: Diagnosis not present

## 2021-09-14 DIAGNOSIS — E43 Unspecified severe protein-calorie malnutrition: Secondary | ICD-10-CM | POA: Diagnosis not present

## 2021-09-14 DIAGNOSIS — R64 Cachexia: Secondary | ICD-10-CM | POA: Diagnosis not present

## 2021-09-17 DIAGNOSIS — J449 Chronic obstructive pulmonary disease, unspecified: Secondary | ICD-10-CM | POA: Diagnosis not present

## 2021-09-17 DIAGNOSIS — E43 Unspecified severe protein-calorie malnutrition: Secondary | ICD-10-CM | POA: Diagnosis not present

## 2021-09-17 DIAGNOSIS — I252 Old myocardial infarction: Secondary | ICD-10-CM | POA: Diagnosis not present

## 2021-09-17 DIAGNOSIS — R64 Cachexia: Secondary | ICD-10-CM | POA: Diagnosis not present

## 2021-09-17 DIAGNOSIS — Z955 Presence of coronary angioplasty implant and graft: Secondary | ICD-10-CM | POA: Diagnosis not present

## 2021-09-17 DIAGNOSIS — Z681 Body mass index (BMI) 19 or less, adult: Secondary | ICD-10-CM | POA: Diagnosis not present

## 2021-09-19 DIAGNOSIS — E43 Unspecified severe protein-calorie malnutrition: Secondary | ICD-10-CM | POA: Diagnosis not present

## 2021-09-19 DIAGNOSIS — Z681 Body mass index (BMI) 19 or less, adult: Secondary | ICD-10-CM | POA: Diagnosis not present

## 2021-09-19 DIAGNOSIS — Z955 Presence of coronary angioplasty implant and graft: Secondary | ICD-10-CM | POA: Diagnosis not present

## 2021-09-19 DIAGNOSIS — J449 Chronic obstructive pulmonary disease, unspecified: Secondary | ICD-10-CM | POA: Diagnosis not present

## 2021-09-19 DIAGNOSIS — I252 Old myocardial infarction: Secondary | ICD-10-CM | POA: Diagnosis not present

## 2021-09-19 DIAGNOSIS — R64 Cachexia: Secondary | ICD-10-CM | POA: Diagnosis not present

## 2021-09-21 DIAGNOSIS — J449 Chronic obstructive pulmonary disease, unspecified: Secondary | ICD-10-CM | POA: Diagnosis not present

## 2021-09-21 DIAGNOSIS — I252 Old myocardial infarction: Secondary | ICD-10-CM | POA: Diagnosis not present

## 2021-09-21 DIAGNOSIS — Z955 Presence of coronary angioplasty implant and graft: Secondary | ICD-10-CM | POA: Diagnosis not present

## 2021-09-21 DIAGNOSIS — Z681 Body mass index (BMI) 19 or less, adult: Secondary | ICD-10-CM | POA: Diagnosis not present

## 2021-09-21 DIAGNOSIS — R64 Cachexia: Secondary | ICD-10-CM | POA: Diagnosis not present

## 2021-09-21 DIAGNOSIS — E43 Unspecified severe protein-calorie malnutrition: Secondary | ICD-10-CM | POA: Diagnosis not present

## 2021-09-22 DIAGNOSIS — F0392 Unspecified dementia, unspecified severity, with psychotic disturbance: Secondary | ICD-10-CM | POA: Diagnosis not present

## 2021-09-22 DIAGNOSIS — N400 Enlarged prostate without lower urinary tract symptoms: Secondary | ICD-10-CM | POA: Diagnosis not present

## 2021-09-22 DIAGNOSIS — R64 Cachexia: Secondary | ICD-10-CM | POA: Diagnosis not present

## 2021-09-22 DIAGNOSIS — E43 Unspecified severe protein-calorie malnutrition: Secondary | ICD-10-CM | POA: Diagnosis not present

## 2021-09-22 DIAGNOSIS — H409 Unspecified glaucoma: Secondary | ICD-10-CM | POA: Diagnosis not present

## 2021-09-22 DIAGNOSIS — J449 Chronic obstructive pulmonary disease, unspecified: Secondary | ICD-10-CM | POA: Diagnosis not present

## 2021-09-22 DIAGNOSIS — Z681 Body mass index (BMI) 19 or less, adult: Secondary | ICD-10-CM | POA: Diagnosis not present

## 2021-09-22 DIAGNOSIS — F0390 Unspecified dementia without behavioral disturbance: Secondary | ICD-10-CM | POA: Diagnosis not present

## 2021-09-22 DIAGNOSIS — Z955 Presence of coronary angioplasty implant and graft: Secondary | ICD-10-CM | POA: Diagnosis not present

## 2021-09-22 DIAGNOSIS — N184 Chronic kidney disease, stage 4 (severe): Secondary | ICD-10-CM | POA: Diagnosis not present

## 2021-09-22 DIAGNOSIS — K902 Blind loop syndrome, not elsewhere classified: Secondary | ICD-10-CM | POA: Diagnosis not present

## 2021-09-22 DIAGNOSIS — K219 Gastro-esophageal reflux disease without esophagitis: Secondary | ICD-10-CM | POA: Diagnosis not present

## 2021-09-22 DIAGNOSIS — I252 Old myocardial infarction: Secondary | ICD-10-CM | POA: Diagnosis not present

## 2021-09-24 DIAGNOSIS — Z681 Body mass index (BMI) 19 or less, adult: Secondary | ICD-10-CM | POA: Diagnosis not present

## 2021-09-24 DIAGNOSIS — I252 Old myocardial infarction: Secondary | ICD-10-CM | POA: Diagnosis not present

## 2021-09-24 DIAGNOSIS — Z955 Presence of coronary angioplasty implant and graft: Secondary | ICD-10-CM | POA: Diagnosis not present

## 2021-09-24 DIAGNOSIS — J449 Chronic obstructive pulmonary disease, unspecified: Secondary | ICD-10-CM | POA: Diagnosis not present

## 2021-09-24 DIAGNOSIS — E43 Unspecified severe protein-calorie malnutrition: Secondary | ICD-10-CM | POA: Diagnosis not present

## 2021-09-24 DIAGNOSIS — R64 Cachexia: Secondary | ICD-10-CM | POA: Diagnosis not present

## 2021-09-26 DIAGNOSIS — E43 Unspecified severe protein-calorie malnutrition: Secondary | ICD-10-CM | POA: Diagnosis not present

## 2021-09-26 DIAGNOSIS — I252 Old myocardial infarction: Secondary | ICD-10-CM | POA: Diagnosis not present

## 2021-09-26 DIAGNOSIS — Z955 Presence of coronary angioplasty implant and graft: Secondary | ICD-10-CM | POA: Diagnosis not present

## 2021-09-26 DIAGNOSIS — R64 Cachexia: Secondary | ICD-10-CM | POA: Diagnosis not present

## 2021-09-26 DIAGNOSIS — Z681 Body mass index (BMI) 19 or less, adult: Secondary | ICD-10-CM | POA: Diagnosis not present

## 2021-09-26 DIAGNOSIS — J449 Chronic obstructive pulmonary disease, unspecified: Secondary | ICD-10-CM | POA: Diagnosis not present

## 2021-09-28 DIAGNOSIS — R64 Cachexia: Secondary | ICD-10-CM | POA: Diagnosis not present

## 2021-09-28 DIAGNOSIS — J449 Chronic obstructive pulmonary disease, unspecified: Secondary | ICD-10-CM | POA: Diagnosis not present

## 2021-09-28 DIAGNOSIS — I252 Old myocardial infarction: Secondary | ICD-10-CM | POA: Diagnosis not present

## 2021-09-28 DIAGNOSIS — Z681 Body mass index (BMI) 19 or less, adult: Secondary | ICD-10-CM | POA: Diagnosis not present

## 2021-09-28 DIAGNOSIS — Z955 Presence of coronary angioplasty implant and graft: Secondary | ICD-10-CM | POA: Diagnosis not present

## 2021-09-28 DIAGNOSIS — E43 Unspecified severe protein-calorie malnutrition: Secondary | ICD-10-CM | POA: Diagnosis not present

## 2021-10-01 DIAGNOSIS — R64 Cachexia: Secondary | ICD-10-CM | POA: Diagnosis not present

## 2021-10-01 DIAGNOSIS — Z955 Presence of coronary angioplasty implant and graft: Secondary | ICD-10-CM | POA: Diagnosis not present

## 2021-10-01 DIAGNOSIS — J449 Chronic obstructive pulmonary disease, unspecified: Secondary | ICD-10-CM | POA: Diagnosis not present

## 2021-10-01 DIAGNOSIS — I252 Old myocardial infarction: Secondary | ICD-10-CM | POA: Diagnosis not present

## 2021-10-01 DIAGNOSIS — Z681 Body mass index (BMI) 19 or less, adult: Secondary | ICD-10-CM | POA: Diagnosis not present

## 2021-10-01 DIAGNOSIS — E43 Unspecified severe protein-calorie malnutrition: Secondary | ICD-10-CM | POA: Diagnosis not present

## 2021-10-02 DIAGNOSIS — J449 Chronic obstructive pulmonary disease, unspecified: Secondary | ICD-10-CM | POA: Diagnosis not present

## 2021-10-02 DIAGNOSIS — E43 Unspecified severe protein-calorie malnutrition: Secondary | ICD-10-CM | POA: Diagnosis not present

## 2021-10-02 DIAGNOSIS — Z955 Presence of coronary angioplasty implant and graft: Secondary | ICD-10-CM | POA: Diagnosis not present

## 2021-10-02 DIAGNOSIS — I252 Old myocardial infarction: Secondary | ICD-10-CM | POA: Diagnosis not present

## 2021-10-02 DIAGNOSIS — R64 Cachexia: Secondary | ICD-10-CM | POA: Diagnosis not present

## 2021-10-02 DIAGNOSIS — Z681 Body mass index (BMI) 19 or less, adult: Secondary | ICD-10-CM | POA: Diagnosis not present

## 2021-10-03 DIAGNOSIS — E43 Unspecified severe protein-calorie malnutrition: Secondary | ICD-10-CM | POA: Diagnosis not present

## 2021-10-03 DIAGNOSIS — R64 Cachexia: Secondary | ICD-10-CM | POA: Diagnosis not present

## 2021-10-03 DIAGNOSIS — I252 Old myocardial infarction: Secondary | ICD-10-CM | POA: Diagnosis not present

## 2021-10-03 DIAGNOSIS — Z955 Presence of coronary angioplasty implant and graft: Secondary | ICD-10-CM | POA: Diagnosis not present

## 2021-10-03 DIAGNOSIS — Z681 Body mass index (BMI) 19 or less, adult: Secondary | ICD-10-CM | POA: Diagnosis not present

## 2021-10-03 DIAGNOSIS — J449 Chronic obstructive pulmonary disease, unspecified: Secondary | ICD-10-CM | POA: Diagnosis not present

## 2021-10-04 DIAGNOSIS — R64 Cachexia: Secondary | ICD-10-CM | POA: Diagnosis not present

## 2021-10-04 DIAGNOSIS — I252 Old myocardial infarction: Secondary | ICD-10-CM | POA: Diagnosis not present

## 2021-10-04 DIAGNOSIS — Z955 Presence of coronary angioplasty implant and graft: Secondary | ICD-10-CM | POA: Diagnosis not present

## 2021-10-04 DIAGNOSIS — Z681 Body mass index (BMI) 19 or less, adult: Secondary | ICD-10-CM | POA: Diagnosis not present

## 2021-10-04 DIAGNOSIS — E43 Unspecified severe protein-calorie malnutrition: Secondary | ICD-10-CM | POA: Diagnosis not present

## 2021-10-04 DIAGNOSIS — J449 Chronic obstructive pulmonary disease, unspecified: Secondary | ICD-10-CM | POA: Diagnosis not present

## 2021-10-05 DIAGNOSIS — J449 Chronic obstructive pulmonary disease, unspecified: Secondary | ICD-10-CM | POA: Diagnosis not present

## 2021-10-05 DIAGNOSIS — E43 Unspecified severe protein-calorie malnutrition: Secondary | ICD-10-CM | POA: Diagnosis not present

## 2021-10-05 DIAGNOSIS — R64 Cachexia: Secondary | ICD-10-CM | POA: Diagnosis not present

## 2021-10-05 DIAGNOSIS — Z955 Presence of coronary angioplasty implant and graft: Secondary | ICD-10-CM | POA: Diagnosis not present

## 2021-10-05 DIAGNOSIS — I252 Old myocardial infarction: Secondary | ICD-10-CM | POA: Diagnosis not present

## 2021-10-05 DIAGNOSIS — Z681 Body mass index (BMI) 19 or less, adult: Secondary | ICD-10-CM | POA: Diagnosis not present

## 2021-10-06 DIAGNOSIS — Z955 Presence of coronary angioplasty implant and graft: Secondary | ICD-10-CM | POA: Diagnosis not present

## 2021-10-06 DIAGNOSIS — Z681 Body mass index (BMI) 19 or less, adult: Secondary | ICD-10-CM | POA: Diagnosis not present

## 2021-10-06 DIAGNOSIS — R64 Cachexia: Secondary | ICD-10-CM | POA: Diagnosis not present

## 2021-10-06 DIAGNOSIS — J449 Chronic obstructive pulmonary disease, unspecified: Secondary | ICD-10-CM | POA: Diagnosis not present

## 2021-10-06 DIAGNOSIS — E43 Unspecified severe protein-calorie malnutrition: Secondary | ICD-10-CM | POA: Diagnosis not present

## 2021-10-06 DIAGNOSIS — I252 Old myocardial infarction: Secondary | ICD-10-CM | POA: Diagnosis not present

## 2021-10-08 DIAGNOSIS — E43 Unspecified severe protein-calorie malnutrition: Secondary | ICD-10-CM | POA: Diagnosis not present

## 2021-10-08 DIAGNOSIS — Z681 Body mass index (BMI) 19 or less, adult: Secondary | ICD-10-CM | POA: Diagnosis not present

## 2021-10-08 DIAGNOSIS — Z955 Presence of coronary angioplasty implant and graft: Secondary | ICD-10-CM | POA: Diagnosis not present

## 2021-10-08 DIAGNOSIS — R64 Cachexia: Secondary | ICD-10-CM | POA: Diagnosis not present

## 2021-10-08 DIAGNOSIS — I252 Old myocardial infarction: Secondary | ICD-10-CM | POA: Diagnosis not present

## 2021-10-08 DIAGNOSIS — J449 Chronic obstructive pulmonary disease, unspecified: Secondary | ICD-10-CM | POA: Diagnosis not present

## 2021-10-09 DIAGNOSIS — Z955 Presence of coronary angioplasty implant and graft: Secondary | ICD-10-CM | POA: Diagnosis not present

## 2021-10-09 DIAGNOSIS — I252 Old myocardial infarction: Secondary | ICD-10-CM | POA: Diagnosis not present

## 2021-10-09 DIAGNOSIS — E43 Unspecified severe protein-calorie malnutrition: Secondary | ICD-10-CM | POA: Diagnosis not present

## 2021-10-09 DIAGNOSIS — Z681 Body mass index (BMI) 19 or less, adult: Secondary | ICD-10-CM | POA: Diagnosis not present

## 2021-10-09 DIAGNOSIS — J449 Chronic obstructive pulmonary disease, unspecified: Secondary | ICD-10-CM | POA: Diagnosis not present

## 2021-10-09 DIAGNOSIS — R64 Cachexia: Secondary | ICD-10-CM | POA: Diagnosis not present

## 2021-10-10 DIAGNOSIS — R64 Cachexia: Secondary | ICD-10-CM | POA: Diagnosis not present

## 2021-10-10 DIAGNOSIS — I252 Old myocardial infarction: Secondary | ICD-10-CM | POA: Diagnosis not present

## 2021-10-10 DIAGNOSIS — Z681 Body mass index (BMI) 19 or less, adult: Secondary | ICD-10-CM | POA: Diagnosis not present

## 2021-10-10 DIAGNOSIS — E43 Unspecified severe protein-calorie malnutrition: Secondary | ICD-10-CM | POA: Diagnosis not present

## 2021-10-10 DIAGNOSIS — Z955 Presence of coronary angioplasty implant and graft: Secondary | ICD-10-CM | POA: Diagnosis not present

## 2021-10-10 DIAGNOSIS — J449 Chronic obstructive pulmonary disease, unspecified: Secondary | ICD-10-CM | POA: Diagnosis not present

## 2021-10-12 DIAGNOSIS — Z955 Presence of coronary angioplasty implant and graft: Secondary | ICD-10-CM | POA: Diagnosis not present

## 2021-10-12 DIAGNOSIS — I252 Old myocardial infarction: Secondary | ICD-10-CM | POA: Diagnosis not present

## 2021-10-12 DIAGNOSIS — R64 Cachexia: Secondary | ICD-10-CM | POA: Diagnosis not present

## 2021-10-12 DIAGNOSIS — Z681 Body mass index (BMI) 19 or less, adult: Secondary | ICD-10-CM | POA: Diagnosis not present

## 2021-10-12 DIAGNOSIS — J449 Chronic obstructive pulmonary disease, unspecified: Secondary | ICD-10-CM | POA: Diagnosis not present

## 2021-10-12 DIAGNOSIS — E43 Unspecified severe protein-calorie malnutrition: Secondary | ICD-10-CM | POA: Diagnosis not present

## 2021-10-15 DIAGNOSIS — R64 Cachexia: Secondary | ICD-10-CM | POA: Diagnosis not present

## 2021-10-15 DIAGNOSIS — Z681 Body mass index (BMI) 19 or less, adult: Secondary | ICD-10-CM | POA: Diagnosis not present

## 2021-10-15 DIAGNOSIS — Z955 Presence of coronary angioplasty implant and graft: Secondary | ICD-10-CM | POA: Diagnosis not present

## 2021-10-15 DIAGNOSIS — E43 Unspecified severe protein-calorie malnutrition: Secondary | ICD-10-CM | POA: Diagnosis not present

## 2021-10-15 DIAGNOSIS — I252 Old myocardial infarction: Secondary | ICD-10-CM | POA: Diagnosis not present

## 2021-10-15 DIAGNOSIS — J449 Chronic obstructive pulmonary disease, unspecified: Secondary | ICD-10-CM | POA: Diagnosis not present

## 2021-10-17 DIAGNOSIS — J449 Chronic obstructive pulmonary disease, unspecified: Secondary | ICD-10-CM | POA: Diagnosis not present

## 2021-10-17 DIAGNOSIS — Z681 Body mass index (BMI) 19 or less, adult: Secondary | ICD-10-CM | POA: Diagnosis not present

## 2021-10-17 DIAGNOSIS — Z955 Presence of coronary angioplasty implant and graft: Secondary | ICD-10-CM | POA: Diagnosis not present

## 2021-10-17 DIAGNOSIS — R64 Cachexia: Secondary | ICD-10-CM | POA: Diagnosis not present

## 2021-10-17 DIAGNOSIS — E43 Unspecified severe protein-calorie malnutrition: Secondary | ICD-10-CM | POA: Diagnosis not present

## 2021-10-17 DIAGNOSIS — I252 Old myocardial infarction: Secondary | ICD-10-CM | POA: Diagnosis not present

## 2021-10-19 DIAGNOSIS — I252 Old myocardial infarction: Secondary | ICD-10-CM | POA: Diagnosis not present

## 2021-10-19 DIAGNOSIS — Z681 Body mass index (BMI) 19 or less, adult: Secondary | ICD-10-CM | POA: Diagnosis not present

## 2021-10-19 DIAGNOSIS — E43 Unspecified severe protein-calorie malnutrition: Secondary | ICD-10-CM | POA: Diagnosis not present

## 2021-10-19 DIAGNOSIS — Z955 Presence of coronary angioplasty implant and graft: Secondary | ICD-10-CM | POA: Diagnosis not present

## 2021-10-19 DIAGNOSIS — J449 Chronic obstructive pulmonary disease, unspecified: Secondary | ICD-10-CM | POA: Diagnosis not present

## 2021-10-19 DIAGNOSIS — R64 Cachexia: Secondary | ICD-10-CM | POA: Diagnosis not present

## 2021-10-22 DIAGNOSIS — Z681 Body mass index (BMI) 19 or less, adult: Secondary | ICD-10-CM | POA: Diagnosis not present

## 2021-10-22 DIAGNOSIS — Z955 Presence of coronary angioplasty implant and graft: Secondary | ICD-10-CM | POA: Diagnosis not present

## 2021-10-22 DIAGNOSIS — E43 Unspecified severe protein-calorie malnutrition: Secondary | ICD-10-CM | POA: Diagnosis not present

## 2021-10-22 DIAGNOSIS — R64 Cachexia: Secondary | ICD-10-CM | POA: Diagnosis not present

## 2021-10-22 DIAGNOSIS — I252 Old myocardial infarction: Secondary | ICD-10-CM | POA: Diagnosis not present

## 2021-10-22 DIAGNOSIS — J449 Chronic obstructive pulmonary disease, unspecified: Secondary | ICD-10-CM | POA: Diagnosis not present

## 2021-10-23 DIAGNOSIS — F0392 Unspecified dementia, unspecified severity, with psychotic disturbance: Secondary | ICD-10-CM | POA: Diagnosis not present

## 2021-10-23 DIAGNOSIS — H409 Unspecified glaucoma: Secondary | ICD-10-CM | POA: Diagnosis not present

## 2021-10-23 DIAGNOSIS — Z681 Body mass index (BMI) 19 or less, adult: Secondary | ICD-10-CM | POA: Diagnosis not present

## 2021-10-23 DIAGNOSIS — N184 Chronic kidney disease, stage 4 (severe): Secondary | ICD-10-CM | POA: Diagnosis not present

## 2021-10-23 DIAGNOSIS — R64 Cachexia: Secondary | ICD-10-CM | POA: Diagnosis not present

## 2021-10-23 DIAGNOSIS — K219 Gastro-esophageal reflux disease without esophagitis: Secondary | ICD-10-CM | POA: Diagnosis not present

## 2021-10-23 DIAGNOSIS — K902 Blind loop syndrome, not elsewhere classified: Secondary | ICD-10-CM | POA: Diagnosis not present

## 2021-10-23 DIAGNOSIS — E43 Unspecified severe protein-calorie malnutrition: Secondary | ICD-10-CM | POA: Diagnosis not present

## 2021-10-23 DIAGNOSIS — Z955 Presence of coronary angioplasty implant and graft: Secondary | ICD-10-CM | POA: Diagnosis not present

## 2021-10-23 DIAGNOSIS — N400 Enlarged prostate without lower urinary tract symptoms: Secondary | ICD-10-CM | POA: Diagnosis not present

## 2021-10-23 DIAGNOSIS — I252 Old myocardial infarction: Secondary | ICD-10-CM | POA: Diagnosis not present

## 2021-10-23 DIAGNOSIS — J449 Chronic obstructive pulmonary disease, unspecified: Secondary | ICD-10-CM | POA: Diagnosis not present

## 2021-10-24 DIAGNOSIS — R64 Cachexia: Secondary | ICD-10-CM | POA: Diagnosis not present

## 2021-10-24 DIAGNOSIS — J449 Chronic obstructive pulmonary disease, unspecified: Secondary | ICD-10-CM | POA: Diagnosis not present

## 2021-10-24 DIAGNOSIS — Z681 Body mass index (BMI) 19 or less, adult: Secondary | ICD-10-CM | POA: Diagnosis not present

## 2021-10-24 DIAGNOSIS — I252 Old myocardial infarction: Secondary | ICD-10-CM | POA: Diagnosis not present

## 2021-10-24 DIAGNOSIS — Z955 Presence of coronary angioplasty implant and graft: Secondary | ICD-10-CM | POA: Diagnosis not present

## 2021-10-24 DIAGNOSIS — E43 Unspecified severe protein-calorie malnutrition: Secondary | ICD-10-CM | POA: Diagnosis not present

## 2021-10-26 DIAGNOSIS — E43 Unspecified severe protein-calorie malnutrition: Secondary | ICD-10-CM | POA: Diagnosis not present

## 2021-10-26 DIAGNOSIS — R64 Cachexia: Secondary | ICD-10-CM | POA: Diagnosis not present

## 2021-10-26 DIAGNOSIS — J449 Chronic obstructive pulmonary disease, unspecified: Secondary | ICD-10-CM | POA: Diagnosis not present

## 2021-10-26 DIAGNOSIS — Z955 Presence of coronary angioplasty implant and graft: Secondary | ICD-10-CM | POA: Diagnosis not present

## 2021-10-26 DIAGNOSIS — Z681 Body mass index (BMI) 19 or less, adult: Secondary | ICD-10-CM | POA: Diagnosis not present

## 2021-10-26 DIAGNOSIS — I252 Old myocardial infarction: Secondary | ICD-10-CM | POA: Diagnosis not present

## 2021-10-29 DIAGNOSIS — Z955 Presence of coronary angioplasty implant and graft: Secondary | ICD-10-CM | POA: Diagnosis not present

## 2021-10-29 DIAGNOSIS — I252 Old myocardial infarction: Secondary | ICD-10-CM | POA: Diagnosis not present

## 2021-10-29 DIAGNOSIS — Z681 Body mass index (BMI) 19 or less, adult: Secondary | ICD-10-CM | POA: Diagnosis not present

## 2021-10-29 DIAGNOSIS — J449 Chronic obstructive pulmonary disease, unspecified: Secondary | ICD-10-CM | POA: Diagnosis not present

## 2021-10-29 DIAGNOSIS — E43 Unspecified severe protein-calorie malnutrition: Secondary | ICD-10-CM | POA: Diagnosis not present

## 2021-10-29 DIAGNOSIS — R64 Cachexia: Secondary | ICD-10-CM | POA: Diagnosis not present

## 2021-10-31 DIAGNOSIS — J449 Chronic obstructive pulmonary disease, unspecified: Secondary | ICD-10-CM | POA: Diagnosis not present

## 2021-10-31 DIAGNOSIS — I252 Old myocardial infarction: Secondary | ICD-10-CM | POA: Diagnosis not present

## 2021-10-31 DIAGNOSIS — R64 Cachexia: Secondary | ICD-10-CM | POA: Diagnosis not present

## 2021-10-31 DIAGNOSIS — Z681 Body mass index (BMI) 19 or less, adult: Secondary | ICD-10-CM | POA: Diagnosis not present

## 2021-10-31 DIAGNOSIS — E43 Unspecified severe protein-calorie malnutrition: Secondary | ICD-10-CM | POA: Diagnosis not present

## 2021-10-31 DIAGNOSIS — Z955 Presence of coronary angioplasty implant and graft: Secondary | ICD-10-CM | POA: Diagnosis not present

## 2021-11-02 DIAGNOSIS — E43 Unspecified severe protein-calorie malnutrition: Secondary | ICD-10-CM | POA: Diagnosis not present

## 2021-11-02 DIAGNOSIS — Z955 Presence of coronary angioplasty implant and graft: Secondary | ICD-10-CM | POA: Diagnosis not present

## 2021-11-02 DIAGNOSIS — J449 Chronic obstructive pulmonary disease, unspecified: Secondary | ICD-10-CM | POA: Diagnosis not present

## 2021-11-02 DIAGNOSIS — I252 Old myocardial infarction: Secondary | ICD-10-CM | POA: Diagnosis not present

## 2021-11-02 DIAGNOSIS — Z681 Body mass index (BMI) 19 or less, adult: Secondary | ICD-10-CM | POA: Diagnosis not present

## 2021-11-02 DIAGNOSIS — R64 Cachexia: Secondary | ICD-10-CM | POA: Diagnosis not present

## 2021-11-04 DIAGNOSIS — E43 Unspecified severe protein-calorie malnutrition: Secondary | ICD-10-CM | POA: Diagnosis not present

## 2021-11-04 DIAGNOSIS — Z681 Body mass index (BMI) 19 or less, adult: Secondary | ICD-10-CM | POA: Diagnosis not present

## 2021-11-04 DIAGNOSIS — Z955 Presence of coronary angioplasty implant and graft: Secondary | ICD-10-CM | POA: Diagnosis not present

## 2021-11-04 DIAGNOSIS — I252 Old myocardial infarction: Secondary | ICD-10-CM | POA: Diagnosis not present

## 2021-11-04 DIAGNOSIS — R64 Cachexia: Secondary | ICD-10-CM | POA: Diagnosis not present

## 2021-11-04 DIAGNOSIS — J449 Chronic obstructive pulmonary disease, unspecified: Secondary | ICD-10-CM | POA: Diagnosis not present

## 2021-11-05 DIAGNOSIS — Z681 Body mass index (BMI) 19 or less, adult: Secondary | ICD-10-CM | POA: Diagnosis not present

## 2021-11-05 DIAGNOSIS — J449 Chronic obstructive pulmonary disease, unspecified: Secondary | ICD-10-CM | POA: Diagnosis not present

## 2021-11-05 DIAGNOSIS — R64 Cachexia: Secondary | ICD-10-CM | POA: Diagnosis not present

## 2021-11-05 DIAGNOSIS — Z955 Presence of coronary angioplasty implant and graft: Secondary | ICD-10-CM | POA: Diagnosis not present

## 2021-11-05 DIAGNOSIS — I252 Old myocardial infarction: Secondary | ICD-10-CM | POA: Diagnosis not present

## 2021-11-05 DIAGNOSIS — E43 Unspecified severe protein-calorie malnutrition: Secondary | ICD-10-CM | POA: Diagnosis not present

## 2021-11-07 DIAGNOSIS — Z955 Presence of coronary angioplasty implant and graft: Secondary | ICD-10-CM | POA: Diagnosis not present

## 2021-11-07 DIAGNOSIS — R64 Cachexia: Secondary | ICD-10-CM | POA: Diagnosis not present

## 2021-11-07 DIAGNOSIS — J449 Chronic obstructive pulmonary disease, unspecified: Secondary | ICD-10-CM | POA: Diagnosis not present

## 2021-11-07 DIAGNOSIS — Z681 Body mass index (BMI) 19 or less, adult: Secondary | ICD-10-CM | POA: Diagnosis not present

## 2021-11-07 DIAGNOSIS — E43 Unspecified severe protein-calorie malnutrition: Secondary | ICD-10-CM | POA: Diagnosis not present

## 2021-11-07 DIAGNOSIS — I252 Old myocardial infarction: Secondary | ICD-10-CM | POA: Diagnosis not present

## 2021-11-08 DIAGNOSIS — R64 Cachexia: Secondary | ICD-10-CM | POA: Diagnosis not present

## 2021-11-08 DIAGNOSIS — E43 Unspecified severe protein-calorie malnutrition: Secondary | ICD-10-CM | POA: Diagnosis not present

## 2021-11-08 DIAGNOSIS — J449 Chronic obstructive pulmonary disease, unspecified: Secondary | ICD-10-CM | POA: Diagnosis not present

## 2021-11-08 DIAGNOSIS — I252 Old myocardial infarction: Secondary | ICD-10-CM | POA: Diagnosis not present

## 2021-11-08 DIAGNOSIS — Z681 Body mass index (BMI) 19 or less, adult: Secondary | ICD-10-CM | POA: Diagnosis not present

## 2021-11-08 DIAGNOSIS — Z955 Presence of coronary angioplasty implant and graft: Secondary | ICD-10-CM | POA: Diagnosis not present

## 2021-11-09 DIAGNOSIS — Z681 Body mass index (BMI) 19 or less, adult: Secondary | ICD-10-CM | POA: Diagnosis not present

## 2021-11-09 DIAGNOSIS — R64 Cachexia: Secondary | ICD-10-CM | POA: Diagnosis not present

## 2021-11-09 DIAGNOSIS — J449 Chronic obstructive pulmonary disease, unspecified: Secondary | ICD-10-CM | POA: Diagnosis not present

## 2021-11-09 DIAGNOSIS — E43 Unspecified severe protein-calorie malnutrition: Secondary | ICD-10-CM | POA: Diagnosis not present

## 2021-11-09 DIAGNOSIS — I252 Old myocardial infarction: Secondary | ICD-10-CM | POA: Diagnosis not present

## 2021-11-09 DIAGNOSIS — Z955 Presence of coronary angioplasty implant and graft: Secondary | ICD-10-CM | POA: Diagnosis not present

## 2021-11-10 DIAGNOSIS — I252 Old myocardial infarction: Secondary | ICD-10-CM | POA: Diagnosis not present

## 2021-11-10 DIAGNOSIS — Z681 Body mass index (BMI) 19 or less, adult: Secondary | ICD-10-CM | POA: Diagnosis not present

## 2021-11-10 DIAGNOSIS — R64 Cachexia: Secondary | ICD-10-CM | POA: Diagnosis not present

## 2021-11-10 DIAGNOSIS — J449 Chronic obstructive pulmonary disease, unspecified: Secondary | ICD-10-CM | POA: Diagnosis not present

## 2021-11-10 DIAGNOSIS — E43 Unspecified severe protein-calorie malnutrition: Secondary | ICD-10-CM | POA: Diagnosis not present

## 2021-11-10 DIAGNOSIS — Z955 Presence of coronary angioplasty implant and graft: Secondary | ICD-10-CM | POA: Diagnosis not present

## 2021-11-11 DIAGNOSIS — Z681 Body mass index (BMI) 19 or less, adult: Secondary | ICD-10-CM | POA: Diagnosis not present

## 2021-11-11 DIAGNOSIS — J449 Chronic obstructive pulmonary disease, unspecified: Secondary | ICD-10-CM | POA: Diagnosis not present

## 2021-11-11 DIAGNOSIS — R64 Cachexia: Secondary | ICD-10-CM | POA: Diagnosis not present

## 2021-11-11 DIAGNOSIS — E43 Unspecified severe protein-calorie malnutrition: Secondary | ICD-10-CM | POA: Diagnosis not present

## 2021-11-11 DIAGNOSIS — I252 Old myocardial infarction: Secondary | ICD-10-CM | POA: Diagnosis not present

## 2021-11-11 DIAGNOSIS — Z955 Presence of coronary angioplasty implant and graft: Secondary | ICD-10-CM | POA: Diagnosis not present

## 2021-11-12 DIAGNOSIS — J449 Chronic obstructive pulmonary disease, unspecified: Secondary | ICD-10-CM | POA: Diagnosis not present

## 2021-11-12 DIAGNOSIS — Z681 Body mass index (BMI) 19 or less, adult: Secondary | ICD-10-CM | POA: Diagnosis not present

## 2021-11-12 DIAGNOSIS — Z955 Presence of coronary angioplasty implant and graft: Secondary | ICD-10-CM | POA: Diagnosis not present

## 2021-11-12 DIAGNOSIS — E43 Unspecified severe protein-calorie malnutrition: Secondary | ICD-10-CM | POA: Diagnosis not present

## 2021-11-12 DIAGNOSIS — I252 Old myocardial infarction: Secondary | ICD-10-CM | POA: Diagnosis not present

## 2021-11-12 DIAGNOSIS — R64 Cachexia: Secondary | ICD-10-CM | POA: Diagnosis not present

## 2021-11-13 DIAGNOSIS — E43 Unspecified severe protein-calorie malnutrition: Secondary | ICD-10-CM | POA: Diagnosis not present

## 2021-11-13 DIAGNOSIS — I252 Old myocardial infarction: Secondary | ICD-10-CM | POA: Diagnosis not present

## 2021-11-13 DIAGNOSIS — R64 Cachexia: Secondary | ICD-10-CM | POA: Diagnosis not present

## 2021-11-13 DIAGNOSIS — Z681 Body mass index (BMI) 19 or less, adult: Secondary | ICD-10-CM | POA: Diagnosis not present

## 2021-11-13 DIAGNOSIS — Z955 Presence of coronary angioplasty implant and graft: Secondary | ICD-10-CM | POA: Diagnosis not present

## 2021-11-13 DIAGNOSIS — J449 Chronic obstructive pulmonary disease, unspecified: Secondary | ICD-10-CM | POA: Diagnosis not present

## 2021-11-14 DIAGNOSIS — I252 Old myocardial infarction: Secondary | ICD-10-CM | POA: Diagnosis not present

## 2021-11-14 DIAGNOSIS — E43 Unspecified severe protein-calorie malnutrition: Secondary | ICD-10-CM | POA: Diagnosis not present

## 2021-11-14 DIAGNOSIS — Z681 Body mass index (BMI) 19 or less, adult: Secondary | ICD-10-CM | POA: Diagnosis not present

## 2021-11-14 DIAGNOSIS — J449 Chronic obstructive pulmonary disease, unspecified: Secondary | ICD-10-CM | POA: Diagnosis not present

## 2021-11-14 DIAGNOSIS — Z955 Presence of coronary angioplasty implant and graft: Secondary | ICD-10-CM | POA: Diagnosis not present

## 2021-11-14 DIAGNOSIS — R64 Cachexia: Secondary | ICD-10-CM | POA: Diagnosis not present

## 2021-11-16 DIAGNOSIS — I252 Old myocardial infarction: Secondary | ICD-10-CM | POA: Diagnosis not present

## 2021-11-16 DIAGNOSIS — Z955 Presence of coronary angioplasty implant and graft: Secondary | ICD-10-CM | POA: Diagnosis not present

## 2021-11-16 DIAGNOSIS — Z681 Body mass index (BMI) 19 or less, adult: Secondary | ICD-10-CM | POA: Diagnosis not present

## 2021-11-16 DIAGNOSIS — J449 Chronic obstructive pulmonary disease, unspecified: Secondary | ICD-10-CM | POA: Diagnosis not present

## 2021-11-16 DIAGNOSIS — E43 Unspecified severe protein-calorie malnutrition: Secondary | ICD-10-CM | POA: Diagnosis not present

## 2021-11-16 DIAGNOSIS — R64 Cachexia: Secondary | ICD-10-CM | POA: Diagnosis not present

## 2021-11-19 DIAGNOSIS — J449 Chronic obstructive pulmonary disease, unspecified: Secondary | ICD-10-CM | POA: Diagnosis not present

## 2021-11-19 DIAGNOSIS — R64 Cachexia: Secondary | ICD-10-CM | POA: Diagnosis not present

## 2021-11-19 DIAGNOSIS — E43 Unspecified severe protein-calorie malnutrition: Secondary | ICD-10-CM | POA: Diagnosis not present

## 2021-11-19 DIAGNOSIS — Z681 Body mass index (BMI) 19 or less, adult: Secondary | ICD-10-CM | POA: Diagnosis not present

## 2021-11-19 DIAGNOSIS — I252 Old myocardial infarction: Secondary | ICD-10-CM | POA: Diagnosis not present

## 2021-11-19 DIAGNOSIS — Z955 Presence of coronary angioplasty implant and graft: Secondary | ICD-10-CM | POA: Diagnosis not present

## 2021-11-21 DIAGNOSIS — E43 Unspecified severe protein-calorie malnutrition: Secondary | ICD-10-CM | POA: Diagnosis not present

## 2021-11-21 DIAGNOSIS — J449 Chronic obstructive pulmonary disease, unspecified: Secondary | ICD-10-CM | POA: Diagnosis not present

## 2021-11-21 DIAGNOSIS — Z955 Presence of coronary angioplasty implant and graft: Secondary | ICD-10-CM | POA: Diagnosis not present

## 2021-11-21 DIAGNOSIS — I252 Old myocardial infarction: Secondary | ICD-10-CM | POA: Diagnosis not present

## 2021-11-21 DIAGNOSIS — Z681 Body mass index (BMI) 19 or less, adult: Secondary | ICD-10-CM | POA: Diagnosis not present

## 2021-11-21 DIAGNOSIS — R64 Cachexia: Secondary | ICD-10-CM | POA: Diagnosis not present

## 2021-11-22 DIAGNOSIS — J449 Chronic obstructive pulmonary disease, unspecified: Secondary | ICD-10-CM | POA: Diagnosis not present

## 2021-11-22 DIAGNOSIS — N400 Enlarged prostate without lower urinary tract symptoms: Secondary | ICD-10-CM | POA: Diagnosis not present

## 2021-11-22 DIAGNOSIS — Z955 Presence of coronary angioplasty implant and graft: Secondary | ICD-10-CM | POA: Diagnosis not present

## 2021-11-22 DIAGNOSIS — F0392 Unspecified dementia, unspecified severity, with psychotic disturbance: Secondary | ICD-10-CM | POA: Diagnosis not present

## 2021-11-22 DIAGNOSIS — H409 Unspecified glaucoma: Secondary | ICD-10-CM | POA: Diagnosis not present

## 2021-11-22 DIAGNOSIS — Z681 Body mass index (BMI) 19 or less, adult: Secondary | ICD-10-CM | POA: Diagnosis not present

## 2021-11-22 DIAGNOSIS — E43 Unspecified severe protein-calorie malnutrition: Secondary | ICD-10-CM | POA: Diagnosis not present

## 2021-11-22 DIAGNOSIS — I252 Old myocardial infarction: Secondary | ICD-10-CM | POA: Diagnosis not present

## 2021-11-22 DIAGNOSIS — K902 Blind loop syndrome, not elsewhere classified: Secondary | ICD-10-CM | POA: Diagnosis not present

## 2021-11-22 DIAGNOSIS — K219 Gastro-esophageal reflux disease without esophagitis: Secondary | ICD-10-CM | POA: Diagnosis not present

## 2021-11-22 DIAGNOSIS — R64 Cachexia: Secondary | ICD-10-CM | POA: Diagnosis not present

## 2021-11-22 DIAGNOSIS — N184 Chronic kidney disease, stage 4 (severe): Secondary | ICD-10-CM | POA: Diagnosis not present

## 2021-11-23 DIAGNOSIS — Z955 Presence of coronary angioplasty implant and graft: Secondary | ICD-10-CM | POA: Diagnosis not present

## 2021-11-23 DIAGNOSIS — Z681 Body mass index (BMI) 19 or less, adult: Secondary | ICD-10-CM | POA: Diagnosis not present

## 2021-11-23 DIAGNOSIS — E43 Unspecified severe protein-calorie malnutrition: Secondary | ICD-10-CM | POA: Diagnosis not present

## 2021-11-23 DIAGNOSIS — J449 Chronic obstructive pulmonary disease, unspecified: Secondary | ICD-10-CM | POA: Diagnosis not present

## 2021-11-23 DIAGNOSIS — I252 Old myocardial infarction: Secondary | ICD-10-CM | POA: Diagnosis not present

## 2021-11-23 DIAGNOSIS — R64 Cachexia: Secondary | ICD-10-CM | POA: Diagnosis not present

## 2021-11-26 DIAGNOSIS — I252 Old myocardial infarction: Secondary | ICD-10-CM | POA: Diagnosis not present

## 2021-11-26 DIAGNOSIS — Z681 Body mass index (BMI) 19 or less, adult: Secondary | ICD-10-CM | POA: Diagnosis not present

## 2021-11-26 DIAGNOSIS — E43 Unspecified severe protein-calorie malnutrition: Secondary | ICD-10-CM | POA: Diagnosis not present

## 2021-11-26 DIAGNOSIS — Z955 Presence of coronary angioplasty implant and graft: Secondary | ICD-10-CM | POA: Diagnosis not present

## 2021-11-26 DIAGNOSIS — R64 Cachexia: Secondary | ICD-10-CM | POA: Diagnosis not present

## 2021-11-26 DIAGNOSIS — J449 Chronic obstructive pulmonary disease, unspecified: Secondary | ICD-10-CM | POA: Diagnosis not present

## 2021-11-27 DIAGNOSIS — R64 Cachexia: Secondary | ICD-10-CM | POA: Diagnosis not present

## 2021-11-27 DIAGNOSIS — I252 Old myocardial infarction: Secondary | ICD-10-CM | POA: Diagnosis not present

## 2021-11-27 DIAGNOSIS — J449 Chronic obstructive pulmonary disease, unspecified: Secondary | ICD-10-CM | POA: Diagnosis not present

## 2021-11-27 DIAGNOSIS — Z681 Body mass index (BMI) 19 or less, adult: Secondary | ICD-10-CM | POA: Diagnosis not present

## 2021-11-27 DIAGNOSIS — Z955 Presence of coronary angioplasty implant and graft: Secondary | ICD-10-CM | POA: Diagnosis not present

## 2021-11-27 DIAGNOSIS — E43 Unspecified severe protein-calorie malnutrition: Secondary | ICD-10-CM | POA: Diagnosis not present

## 2021-11-28 DIAGNOSIS — Z955 Presence of coronary angioplasty implant and graft: Secondary | ICD-10-CM | POA: Diagnosis not present

## 2021-11-28 DIAGNOSIS — J449 Chronic obstructive pulmonary disease, unspecified: Secondary | ICD-10-CM | POA: Diagnosis not present

## 2021-11-28 DIAGNOSIS — Z681 Body mass index (BMI) 19 or less, adult: Secondary | ICD-10-CM | POA: Diagnosis not present

## 2021-11-28 DIAGNOSIS — R64 Cachexia: Secondary | ICD-10-CM | POA: Diagnosis not present

## 2021-11-28 DIAGNOSIS — E43 Unspecified severe protein-calorie malnutrition: Secondary | ICD-10-CM | POA: Diagnosis not present

## 2021-11-28 DIAGNOSIS — I252 Old myocardial infarction: Secondary | ICD-10-CM | POA: Diagnosis not present

## 2021-11-30 DIAGNOSIS — Z955 Presence of coronary angioplasty implant and graft: Secondary | ICD-10-CM | POA: Diagnosis not present

## 2021-11-30 DIAGNOSIS — I252 Old myocardial infarction: Secondary | ICD-10-CM | POA: Diagnosis not present

## 2021-11-30 DIAGNOSIS — J449 Chronic obstructive pulmonary disease, unspecified: Secondary | ICD-10-CM | POA: Diagnosis not present

## 2021-11-30 DIAGNOSIS — E43 Unspecified severe protein-calorie malnutrition: Secondary | ICD-10-CM | POA: Diagnosis not present

## 2021-11-30 DIAGNOSIS — R64 Cachexia: Secondary | ICD-10-CM | POA: Diagnosis not present

## 2021-11-30 DIAGNOSIS — Z681 Body mass index (BMI) 19 or less, adult: Secondary | ICD-10-CM | POA: Diagnosis not present

## 2021-12-03 DIAGNOSIS — J449 Chronic obstructive pulmonary disease, unspecified: Secondary | ICD-10-CM | POA: Diagnosis not present

## 2021-12-03 DIAGNOSIS — Z955 Presence of coronary angioplasty implant and graft: Secondary | ICD-10-CM | POA: Diagnosis not present

## 2021-12-03 DIAGNOSIS — R64 Cachexia: Secondary | ICD-10-CM | POA: Diagnosis not present

## 2021-12-03 DIAGNOSIS — E43 Unspecified severe protein-calorie malnutrition: Secondary | ICD-10-CM | POA: Diagnosis not present

## 2021-12-03 DIAGNOSIS — Z681 Body mass index (BMI) 19 or less, adult: Secondary | ICD-10-CM | POA: Diagnosis not present

## 2021-12-03 DIAGNOSIS — I252 Old myocardial infarction: Secondary | ICD-10-CM | POA: Diagnosis not present

## 2021-12-05 DIAGNOSIS — Z681 Body mass index (BMI) 19 or less, adult: Secondary | ICD-10-CM | POA: Diagnosis not present

## 2021-12-05 DIAGNOSIS — R64 Cachexia: Secondary | ICD-10-CM | POA: Diagnosis not present

## 2021-12-05 DIAGNOSIS — I252 Old myocardial infarction: Secondary | ICD-10-CM | POA: Diagnosis not present

## 2021-12-05 DIAGNOSIS — J449 Chronic obstructive pulmonary disease, unspecified: Secondary | ICD-10-CM | POA: Diagnosis not present

## 2021-12-05 DIAGNOSIS — E43 Unspecified severe protein-calorie malnutrition: Secondary | ICD-10-CM | POA: Diagnosis not present

## 2021-12-05 DIAGNOSIS — Z955 Presence of coronary angioplasty implant and graft: Secondary | ICD-10-CM | POA: Diagnosis not present

## 2021-12-06 DIAGNOSIS — E43 Unspecified severe protein-calorie malnutrition: Secondary | ICD-10-CM | POA: Diagnosis not present

## 2021-12-06 DIAGNOSIS — Z955 Presence of coronary angioplasty implant and graft: Secondary | ICD-10-CM | POA: Diagnosis not present

## 2021-12-06 DIAGNOSIS — J449 Chronic obstructive pulmonary disease, unspecified: Secondary | ICD-10-CM | POA: Diagnosis not present

## 2021-12-06 DIAGNOSIS — I252 Old myocardial infarction: Secondary | ICD-10-CM | POA: Diagnosis not present

## 2021-12-06 DIAGNOSIS — R64 Cachexia: Secondary | ICD-10-CM | POA: Diagnosis not present

## 2021-12-06 DIAGNOSIS — Z681 Body mass index (BMI) 19 or less, adult: Secondary | ICD-10-CM | POA: Diagnosis not present

## 2021-12-10 ENCOUNTER — Telehealth: Payer: Self-pay | Admitting: Cardiology

## 2021-12-10 NOTE — Telephone Encounter (Signed)
Patient's wife calling to inform Dr. Ellyn Hack that the patient passed away 03/22/23 morning at 7:30 am. She states she does not need a call back.

## 2021-12-10 NOTE — Telephone Encounter (Signed)
Sad - but not unexpected.   Was struggling to thrive for years,  [facility]

## 2021-12-23 DEATH — deceased
